# Patient Record
Sex: Female | Born: 1955 | Race: Black or African American | Hispanic: No | Marital: Married | Smoking: Never smoker
Health system: Southern US, Community
[De-identification: ages and names within clinical notes are randomized; demographics above are authoritative.]

## PROBLEM LIST (undated history)

## (undated) DIAGNOSIS — M719 Bursopathy, unspecified: Secondary | ICD-10-CM

## (undated) DIAGNOSIS — R002 Palpitations: Secondary | ICD-10-CM

## (undated) DIAGNOSIS — J3489 Other specified disorders of nose and nasal sinuses: Secondary | ICD-10-CM

## (undated) DIAGNOSIS — F410 Panic disorder [episodic paroxysmal anxiety] without agoraphobia: Secondary | ICD-10-CM

## (undated) DIAGNOSIS — R9439 Abnormal result of other cardiovascular function study: Secondary | ICD-10-CM

## (undated) DIAGNOSIS — F439 Reaction to severe stress, unspecified: Secondary | ICD-10-CM

## (undated) DIAGNOSIS — K644 Residual hemorrhoidal skin tags: Secondary | ICD-10-CM

## (undated) DIAGNOSIS — E669 Obesity, unspecified: Secondary | ICD-10-CM

## (undated) DIAGNOSIS — K219 Gastro-esophageal reflux disease without esophagitis: Secondary | ICD-10-CM

## (undated) DIAGNOSIS — M5136 Other intervertebral disc degeneration, lumbar region: Secondary | ICD-10-CM

## (undated) DIAGNOSIS — E119 Type 2 diabetes mellitus without complications: Secondary | ICD-10-CM

## (undated) DIAGNOSIS — E785 Hyperlipidemia, unspecified: Secondary | ICD-10-CM

## (undated) DIAGNOSIS — F32A Depression, unspecified: Secondary | ICD-10-CM

## (undated) DIAGNOSIS — J301 Allergic rhinitis due to pollen: Secondary | ICD-10-CM

## (undated) DIAGNOSIS — M549 Dorsalgia, unspecified: Secondary | ICD-10-CM

## (undated) DIAGNOSIS — G479 Sleep disorder, unspecified: Secondary | ICD-10-CM

## (undated) DIAGNOSIS — R238 Other skin changes: Secondary | ICD-10-CM

## (undated) DIAGNOSIS — F419 Anxiety disorder, unspecified: Secondary | ICD-10-CM

## (undated) DIAGNOSIS — M6289 Other specified disorders of muscle: Secondary | ICD-10-CM

## (undated) DIAGNOSIS — M1990 Unspecified osteoarthritis, unspecified site: Secondary | ICD-10-CM

## (undated) DIAGNOSIS — M25569 Pain in unspecified knee: Secondary | ICD-10-CM

## (undated) DIAGNOSIS — R682 Dry mouth, unspecified: Secondary | ICD-10-CM

## (undated) DIAGNOSIS — R450 Nervousness: Secondary | ICD-10-CM

## (undated) DIAGNOSIS — G4730 Sleep apnea, unspecified: Secondary | ICD-10-CM

## (undated) DIAGNOSIS — M51369 Other intervertebral disc degeneration, lumbar region without mention of lumbar back pain or lower extremity pain: Secondary | ICD-10-CM

## (undated) DIAGNOSIS — K5900 Constipation, unspecified: Secondary | ICD-10-CM

## (undated) DIAGNOSIS — N289 Disorder of kidney and ureter, unspecified: Secondary | ICD-10-CM

## (undated) DIAGNOSIS — R5383 Other fatigue: Secondary | ICD-10-CM

## (undated) DIAGNOSIS — I10 Essential (primary) hypertension: Secondary | ICD-10-CM

## (undated) DIAGNOSIS — F329 Major depressive disorder, single episode, unspecified: Secondary | ICD-10-CM

## (undated) DIAGNOSIS — R252 Cramp and spasm: Secondary | ICD-10-CM

## (undated) HISTORY — DX: Other specified disorders of muscle: M62.89

## (undated) HISTORY — DX: Other intervertebral disc degeneration, lumbar region without mention of lumbar back pain or lower extremity pain: M51.369

## (undated) HISTORY — DX: Allergic rhinitis due to pollen: J30.1

## (undated) HISTORY — DX: Abnormal result of other cardiovascular function study: R94.39

## (undated) HISTORY — DX: Panic disorder (episodic paroxysmal anxiety): F41.0

## (undated) HISTORY — DX: Nervousness: R45.0

## (undated) HISTORY — DX: Depression, unspecified: F32.A

## (undated) HISTORY — DX: Sleep disorder, unspecified: G47.9

## (undated) HISTORY — DX: Bursopathy, unspecified: M71.9

## (undated) HISTORY — DX: Other specified disorders of nose and nasal sinuses: J34.89

## (undated) HISTORY — PX: REPLACEMENT TOTAL KNEE: SUR1224

## (undated) HISTORY — DX: Dorsalgia, unspecified: M54.9

## (undated) HISTORY — DX: Essential (primary) hypertension: I10

## (undated) HISTORY — DX: Sleep apnea, unspecified: G47.30

## (undated) HISTORY — DX: Palpitations: R00.2

## (undated) HISTORY — DX: Dry mouth, unspecified: R68.2

## (undated) HISTORY — DX: Other fatigue: R53.83

## (undated) HISTORY — DX: Other intervertebral disc degeneration, lumbar region: M51.36

## (undated) HISTORY — PX: RIGHT OOPHORECTOMY: SHX2359

## (undated) HISTORY — DX: Reaction to severe stress, unspecified: F43.9

## (undated) HISTORY — DX: Gastro-esophageal reflux disease without esophagitis: K21.9

## (undated) HISTORY — DX: Unspecified osteoarthritis, unspecified site: M19.90

## (undated) HISTORY — DX: Residual hemorrhoidal skin tags: K64.4

## (undated) HISTORY — DX: Obesity, unspecified: E66.9

## (undated) HISTORY — DX: Other skin changes: R23.8

## (undated) HISTORY — PX: DILATION AND CURETTAGE OF UTERUS: SHX78

## (undated) HISTORY — DX: Anxiety disorder, unspecified: F41.9

## (undated) HISTORY — DX: Cramp and spasm: R25.2

## (undated) HISTORY — DX: Hyperlipidemia, unspecified: E78.5

## (undated) HISTORY — DX: Major depressive disorder, single episode, unspecified: F32.9

## (undated) HISTORY — DX: Constipation, unspecified: K59.00

## (undated) HISTORY — PX: ABDOMINAL HYSTERECTOMY: SHX81

## (undated) HISTORY — DX: Pain in unspecified knee: M25.569

---

## 1998-10-16 ENCOUNTER — Other Ambulatory Visit: Admission: RE | Admit: 1998-10-16 | Discharge: 1998-10-16 | Payer: Self-pay | Admitting: Obstetrics and Gynecology

## 1999-02-12 ENCOUNTER — Encounter: Admission: RE | Admit: 1999-02-12 | Discharge: 1999-05-13 | Payer: Self-pay | Admitting: Family Medicine

## 1999-02-23 ENCOUNTER — Encounter: Payer: Self-pay | Admitting: Obstetrics and Gynecology

## 1999-02-23 ENCOUNTER — Ambulatory Visit (HOSPITAL_COMMUNITY): Admission: RE | Admit: 1999-02-23 | Discharge: 1999-02-23 | Payer: Self-pay | Admitting: Obstetrics and Gynecology

## 1999-03-03 ENCOUNTER — Ambulatory Visit (HOSPITAL_COMMUNITY): Admission: RE | Admit: 1999-03-03 | Discharge: 1999-03-03 | Payer: Self-pay | Admitting: Obstetrics and Gynecology

## 1999-03-03 ENCOUNTER — Encounter: Payer: Self-pay | Admitting: Obstetrics and Gynecology

## 1999-04-02 ENCOUNTER — Observation Stay (HOSPITAL_COMMUNITY): Admission: EM | Admit: 1999-04-02 | Discharge: 1999-04-03 | Payer: Self-pay | Admitting: Obstetrics and Gynecology

## 1999-04-02 ENCOUNTER — Encounter: Payer: Self-pay | Admitting: Obstetrics and Gynecology

## 1999-07-23 ENCOUNTER — Ambulatory Visit (HOSPITAL_BASED_OUTPATIENT_CLINIC_OR_DEPARTMENT_OTHER): Admission: RE | Admit: 1999-07-23 | Discharge: 1999-07-23 | Payer: Self-pay | Admitting: *Deleted

## 1999-08-07 ENCOUNTER — Ambulatory Visit (HOSPITAL_COMMUNITY): Admission: RE | Admit: 1999-08-07 | Discharge: 1999-08-07 | Payer: Self-pay | Admitting: *Deleted

## 2000-11-04 ENCOUNTER — Encounter (INDEPENDENT_AMBULATORY_CARE_PROVIDER_SITE_OTHER): Payer: Self-pay | Admitting: Specialist

## 2000-11-04 ENCOUNTER — Other Ambulatory Visit: Admission: RE | Admit: 2000-11-04 | Discharge: 2000-11-04 | Payer: Self-pay | Admitting: *Deleted

## 2001-01-24 ENCOUNTER — Encounter: Payer: Self-pay | Admitting: Family Medicine

## 2001-01-24 ENCOUNTER — Encounter: Admission: RE | Admit: 2001-01-24 | Discharge: 2001-01-24 | Payer: Self-pay | Admitting: Family Medicine

## 2001-03-14 ENCOUNTER — Encounter (INDEPENDENT_AMBULATORY_CARE_PROVIDER_SITE_OTHER): Payer: Self-pay

## 2001-03-14 ENCOUNTER — Observation Stay (HOSPITAL_COMMUNITY): Admission: RE | Admit: 2001-03-14 | Discharge: 2001-03-15 | Payer: Self-pay | Admitting: *Deleted

## 2002-03-21 ENCOUNTER — Other Ambulatory Visit: Admission: RE | Admit: 2002-03-21 | Discharge: 2002-03-21 | Payer: Self-pay | Admitting: *Deleted

## 2002-06-01 ENCOUNTER — Encounter: Admission: RE | Admit: 2002-06-01 | Discharge: 2002-06-01 | Payer: Self-pay | Admitting: Neurology

## 2002-06-01 ENCOUNTER — Encounter: Payer: Self-pay | Admitting: Neurology

## 2003-01-09 ENCOUNTER — Ambulatory Visit: Admission: RE | Admit: 2003-01-09 | Discharge: 2003-01-09 | Payer: Self-pay | Admitting: Family Medicine

## 2003-04-17 ENCOUNTER — Ambulatory Visit (HOSPITAL_BASED_OUTPATIENT_CLINIC_OR_DEPARTMENT_OTHER): Admission: RE | Admit: 2003-04-17 | Discharge: 2003-04-17 | Payer: Self-pay | Admitting: Orthopedic Surgery

## 2003-04-30 ENCOUNTER — Other Ambulatory Visit: Admission: RE | Admit: 2003-04-30 | Discharge: 2003-04-30 | Payer: Self-pay | Admitting: *Deleted

## 2003-06-14 ENCOUNTER — Encounter: Admission: RE | Admit: 2003-06-14 | Discharge: 2003-06-14 | Payer: Self-pay | Admitting: *Deleted

## 2003-06-14 ENCOUNTER — Encounter: Payer: Self-pay | Admitting: *Deleted

## 2003-07-04 ENCOUNTER — Ambulatory Visit (HOSPITAL_COMMUNITY): Admission: RE | Admit: 2003-07-04 | Discharge: 2003-07-04 | Payer: Self-pay | Admitting: Cardiology

## 2003-07-04 ENCOUNTER — Encounter: Payer: Self-pay | Admitting: Cardiology

## 2004-04-20 ENCOUNTER — Encounter: Payer: Self-pay | Admitting: Internal Medicine

## 2004-04-20 LAB — HM COLONOSCOPY: HM Colonoscopy: NORMAL

## 2004-04-29 ENCOUNTER — Other Ambulatory Visit: Admission: RE | Admit: 2004-04-29 | Discharge: 2004-04-29 | Payer: Self-pay | Admitting: *Deleted

## 2004-07-27 ENCOUNTER — Encounter: Admission: RE | Admit: 2004-07-27 | Discharge: 2004-07-27 | Payer: Self-pay | Admitting: *Deleted

## 2004-10-08 ENCOUNTER — Ambulatory Visit: Payer: Self-pay | Admitting: Family Medicine

## 2004-12-02 ENCOUNTER — Ambulatory Visit: Payer: Self-pay | Admitting: Family Medicine

## 2004-12-09 ENCOUNTER — Other Ambulatory Visit (HOSPITAL_COMMUNITY): Admission: RE | Admit: 2004-12-09 | Discharge: 2004-12-24 | Payer: Self-pay | Admitting: Psychiatry

## 2004-12-09 ENCOUNTER — Ambulatory Visit: Payer: Self-pay | Admitting: Psychiatry

## 2005-02-25 ENCOUNTER — Ambulatory Visit: Payer: Self-pay | Admitting: Family Medicine

## 2005-05-03 ENCOUNTER — Other Ambulatory Visit: Admission: RE | Admit: 2005-05-03 | Discharge: 2005-05-03 | Payer: Self-pay | Admitting: *Deleted

## 2005-06-02 ENCOUNTER — Inpatient Hospital Stay (HOSPITAL_COMMUNITY): Admission: RE | Admit: 2005-06-02 | Discharge: 2005-06-06 | Payer: Self-pay | Admitting: Orthopedic Surgery

## 2005-07-23 ENCOUNTER — Ambulatory Visit: Payer: Self-pay | Admitting: Family Medicine

## 2005-08-24 ENCOUNTER — Encounter: Admission: RE | Admit: 2005-08-24 | Discharge: 2005-08-24 | Payer: Self-pay | Admitting: *Deleted

## 2005-09-14 ENCOUNTER — Ambulatory Visit: Payer: Self-pay | Admitting: Family Medicine

## 2006-05-18 ENCOUNTER — Other Ambulatory Visit: Admission: RE | Admit: 2006-05-18 | Discharge: 2006-05-18 | Payer: Self-pay | Admitting: *Deleted

## 2006-06-06 ENCOUNTER — Ambulatory Visit: Payer: Self-pay | Admitting: Family Medicine

## 2006-06-11 ENCOUNTER — Emergency Department (HOSPITAL_COMMUNITY): Admission: EM | Admit: 2006-06-11 | Discharge: 2006-06-11 | Payer: Self-pay | Admitting: Emergency Medicine

## 2006-08-23 ENCOUNTER — Ambulatory Visit: Payer: Self-pay | Admitting: Family Medicine

## 2006-08-27 ENCOUNTER — Emergency Department (HOSPITAL_COMMUNITY): Admission: EM | Admit: 2006-08-27 | Discharge: 2006-08-27 | Payer: Self-pay | Admitting: Emergency Medicine

## 2006-08-29 ENCOUNTER — Encounter: Admission: RE | Admit: 2006-08-29 | Discharge: 2006-08-29 | Payer: Self-pay | Admitting: *Deleted

## 2006-09-06 ENCOUNTER — Ambulatory Visit: Payer: Self-pay | Admitting: Family Medicine

## 2006-09-06 LAB — CONVERTED CEMR LAB: Potassium: 4.8 meq/L (ref 3.5–5.1)

## 2006-09-20 ENCOUNTER — Ambulatory Visit: Payer: Self-pay | Admitting: Family Medicine

## 2006-09-20 LAB — CONVERTED CEMR LAB
ALT: 11 units/L (ref 0–40)
AST: 16 units/L (ref 0–37)
Albumin: 3.5 g/dL (ref 3.5–5.2)
Alkaline Phosphatase: 105 units/L (ref 39–117)
BUN: 18 mg/dL (ref 6–23)
Basophils Absolute: 0 10*3/uL (ref 0.0–0.1)
Basophils Relative: 0.2 % (ref 0.0–1.0)
CO2: 31 meq/L (ref 19–32)
Calcium: 9.7 mg/dL (ref 8.4–10.5)
Chloride: 107 meq/L (ref 96–112)
Chol/HDL Ratio, serum: 3.1
Cholesterol: 193 mg/dL (ref 0–200)
Creatinine, Ser: 0.8 mg/dL (ref 0.4–1.2)
Eosinophil percent: 0.6 % (ref 0.0–5.0)
GFR calc non Af Amer: 80 mL/min
Glomerular Filtration Rate, Af Am: 97 mL/min/{1.73_m2}
Glucose, Bld: 89 mg/dL (ref 70–99)
HCT: 37.4 % (ref 36.0–46.0)
HDL: 62.3 mg/dL (ref >39.0)
Hemoglobin: 12.5 g/dL (ref 12.0–15.0)
LDL Cholesterol: 121 mg/dL — ABNORMAL HIGH (ref 0–99)
Lymphocytes Relative: 27.6 % (ref 12.0–46.0)
MCHC: 33.5 g/dL (ref 30.0–36.0)
MCV: 89.9 fL (ref 78.0–100.0)
Monocytes Absolute: 0.4 10*3/uL (ref 0.2–0.7)
Monocytes Relative: 6.9 % (ref 3.0–11.0)
Neutro Abs: 4 10*3/uL (ref 1.4–7.7)
Neutrophils Relative %: 64.7 % (ref 43.0–77.0)
Platelets: 212 10*3/uL (ref 150–400)
Potassium: 4.5 meq/L (ref 3.5–5.1)
RBC: 4.16 M/uL (ref 3.87–5.11)
RDW: 14.6 % (ref 11.5–14.6)
Sodium: 143 meq/L (ref 135–145)
TSH: 2.27 microintl units/mL (ref 0.35–5.50)
Total Bilirubin: 0.8 mg/dL (ref 0.3–1.2)
Total Protein: 7.2 g/dL (ref 6.0–8.3)
Triglyceride fasting, serum: 51 mg/dL (ref 0–149)
VLDL: 10 mg/dL (ref 0–40)
WBC: 6.1 10*3/uL (ref 4.5–10.5)

## 2006-09-27 ENCOUNTER — Ambulatory Visit: Payer: Self-pay | Admitting: Family Medicine

## 2007-01-16 ENCOUNTER — Ambulatory Visit: Payer: Self-pay | Admitting: Family Medicine

## 2007-04-13 ENCOUNTER — Ambulatory Visit: Payer: Self-pay | Admitting: Family Medicine

## 2007-07-04 ENCOUNTER — Other Ambulatory Visit: Admission: RE | Admit: 2007-07-04 | Discharge: 2007-07-04 | Payer: Self-pay | Admitting: *Deleted

## 2007-07-04 ENCOUNTER — Telehealth: Payer: Self-pay | Admitting: Family Medicine

## 2007-11-20 DIAGNOSIS — J069 Acute upper respiratory infection, unspecified: Secondary | ICD-10-CM | POA: Insufficient documentation

## 2007-11-27 ENCOUNTER — Ambulatory Visit: Payer: Self-pay | Admitting: Family Medicine

## 2007-12-29 ENCOUNTER — Ambulatory Visit: Payer: Self-pay | Admitting: Family Medicine

## 2007-12-29 LAB — CONVERTED CEMR LAB
ALT: 12 units/L (ref 0–35)
AST: 17 units/L (ref 0–37)
Albumin: 3.6 g/dL (ref 3.5–5.2)
Alkaline Phosphatase: 106 units/L (ref 39–117)
BUN: 12 mg/dL (ref 6–23)
Basophils Absolute: 0.1 10*3/uL (ref 0.0–0.1)
Basophils Relative: 0.6 % (ref 0.0–1.0)
Bilirubin, Direct: 0.1 mg/dL (ref 0.0–0.3)
Blood in Urine, dipstick: NEGATIVE
CO2: 30 meq/L (ref 19–32)
Calcium: 9.4 mg/dL (ref 8.4–10.5)
Chloride: 105 meq/L (ref 96–112)
Cholesterol: 197 mg/dL (ref 0–200)
Creatinine, Ser: 0.8 mg/dL (ref 0.4–1.2)
Eosinophils Absolute: 0 10*3/uL (ref 0.0–0.6)
Eosinophils Relative: 0.4 % (ref 0.0–5.0)
GFR calc Af Amer: 96 mL/min
GFR calc non Af Amer: 79 mL/min
Glucose, Bld: 88 mg/dL (ref 70–99)
Glucose, Urine, Semiquant: NEGATIVE
HCT: 38.7 % (ref 36.0–46.0)
HDL: 67 mg/dL (ref >39.0)
Hemoglobin: 12.8 g/dL (ref 12.0–15.0)
LDL Cholesterol: 123 mg/dL — ABNORMAL HIGH (ref 0–99)
Lymphocytes Relative: 23.5 % (ref 12.0–46.0)
MCHC: 33 g/dL (ref 30.0–36.0)
MCV: 89.2 fL (ref 78.0–100.0)
Monocytes Absolute: 0.6 10*3/uL (ref 0.2–0.7)
Monocytes Relative: 6.5 % (ref 3.0–11.0)
Neutro Abs: 6 10*3/uL (ref 1.4–7.7)
Neutrophils Relative %: 69 % (ref 43.0–77.0)
Nitrite: NEGATIVE
Platelets: 235 10*3/uL (ref 150–400)
Potassium: 4.3 meq/L (ref 3.5–5.1)
Protein, U semiquant: 30
RBC: 4.34 M/uL (ref 3.87–5.11)
RDW: 13.2 % (ref 11.5–14.6)
Sodium: 139 meq/L (ref 135–145)
Specific Gravity, Urine: 1.025
TSH: 2.83 microintl units/mL (ref 0.35–5.50)
Total Bilirubin: 0.7 mg/dL (ref 0.3–1.2)
Total CHOL/HDL Ratio: 2.9
Total Protein: 6.6 g/dL (ref 6.0–8.3)
Triglycerides: 33 mg/dL (ref 0–149)
Urobilinogen, UA: 0.2
VLDL: 7 mg/dL (ref 0–40)
WBC Urine, dipstick: NEGATIVE
WBC: 8.7 10*3/uL (ref 4.5–10.5)
pH: 6.5

## 2008-01-12 ENCOUNTER — Ambulatory Visit: Payer: Self-pay | Admitting: Family Medicine

## 2008-01-12 DIAGNOSIS — E785 Hyperlipidemia, unspecified: Secondary | ICD-10-CM | POA: Insufficient documentation

## 2008-01-12 DIAGNOSIS — E1169 Type 2 diabetes mellitus with other specified complication: Secondary | ICD-10-CM | POA: Insufficient documentation

## 2008-01-12 DIAGNOSIS — I10 Essential (primary) hypertension: Secondary | ICD-10-CM | POA: Insufficient documentation

## 2008-01-12 DIAGNOSIS — J309 Allergic rhinitis, unspecified: Secondary | ICD-10-CM | POA: Insufficient documentation

## 2008-01-12 DIAGNOSIS — F339 Major depressive disorder, recurrent, unspecified: Secondary | ICD-10-CM | POA: Insufficient documentation

## 2008-01-12 DIAGNOSIS — E1159 Type 2 diabetes mellitus with other circulatory complications: Secondary | ICD-10-CM | POA: Insufficient documentation

## 2008-07-08 ENCOUNTER — Encounter
Admission: RE | Admit: 2008-07-08 | Discharge: 2008-07-08 | Payer: Self-pay | Admitting: Physical Medicine and Rehabilitation

## 2008-10-14 ENCOUNTER — Other Ambulatory Visit: Admission: RE | Admit: 2008-10-14 | Discharge: 2008-10-14 | Payer: Self-pay | Admitting: Gynecology

## 2008-12-02 ENCOUNTER — Ambulatory Visit: Payer: Self-pay | Admitting: Family Medicine

## 2008-12-02 DIAGNOSIS — M26609 Unspecified temporomandibular joint disorder, unspecified side: Secondary | ICD-10-CM | POA: Insufficient documentation

## 2008-12-02 LAB — CONVERTED CEMR LAB
Bilirubin Urine: NEGATIVE
Blood in Urine, dipstick: NEGATIVE
Glucose, Urine, Semiquant: NEGATIVE
Ketones, urine, test strip: NEGATIVE
Nitrite: NEGATIVE
Specific Gravity, Urine: 1.015
Urobilinogen, UA: 0.2
WBC Urine, dipstick: NEGATIVE
pH: 7

## 2009-01-23 ENCOUNTER — Ambulatory Visit: Payer: Self-pay | Admitting: Family Medicine

## 2009-01-23 LAB — CONVERTED CEMR LAB
ALT: 14 units/L (ref 0–35)
AST: 19 units/L (ref 0–37)
Albumin: 3.3 g/dL — ABNORMAL LOW (ref 3.5–5.2)
Alkaline Phosphatase: 112 units/L (ref 39–117)
BUN: 15 mg/dL (ref 6–23)
Basophils Absolute: 0 10*3/uL (ref 0.0–0.1)
Basophils Relative: 0.4 % (ref 0.0–3.0)
Bilirubin, Direct: 0.1 mg/dL (ref 0.0–0.3)
CO2: 31 meq/L (ref 19–32)
Calcium: 9.1 mg/dL (ref 8.4–10.5)
Chloride: 105 meq/L (ref 96–112)
Cholesterol: 170 mg/dL (ref 0–200)
Creatinine, Ser: 0.7 mg/dL (ref 0.4–1.2)
Eosinophils Absolute: 0.1 10*3/uL (ref 0.0–0.7)
Eosinophils Relative: 0.9 % (ref 0.0–5.0)
GFR calc non Af Amer: 111.66 mL/min (ref >60)
Glucose, Bld: 104 mg/dL — ABNORMAL HIGH (ref 70–99)
HCT: 37.4 % (ref 36.0–46.0)
HDL: 44.2 mg/dL (ref >39.00)
Hemoglobin: 12.6 g/dL (ref 12.0–15.0)
Hgb A1c MFr Bld: 6.2 % (ref 4.6–6.5)
LDL Cholesterol: 111 mg/dL — ABNORMAL HIGH (ref 0–99)
Lymphocytes Relative: 23 % (ref 12.0–46.0)
Lymphs Abs: 1.3 10*3/uL (ref 0.7–4.0)
MCHC: 33.9 g/dL (ref 30.0–36.0)
MCV: 89.6 fL (ref 78.0–100.0)
Monocytes Absolute: 0.4 10*3/uL (ref 0.1–1.0)
Monocytes Relative: 7.4 % (ref 3.0–12.0)
Neutro Abs: 3.8 10*3/uL (ref 1.4–7.7)
Neutrophils Relative %: 68.3 % (ref 43.0–77.0)
Platelets: 218 10*3/uL (ref 150.0–400.0)
Potassium: 3.9 meq/L (ref 3.5–5.1)
RBC: 4.17 M/uL (ref 3.87–5.11)
RDW: 13.4 % (ref 11.5–14.6)
Sodium: 142 meq/L (ref 135–145)
TSH: 1.9 microintl units/mL (ref 0.35–5.50)
Total Bilirubin: 0.7 mg/dL (ref 0.3–1.2)
Total CHOL/HDL Ratio: 4
Total Protein: 7.1 g/dL (ref 6.0–8.3)
Triglycerides: 75 mg/dL (ref 0.0–149.0)
VLDL: 15 mg/dL (ref 0.0–40.0)
WBC: 5.6 10*3/uL (ref 4.5–10.5)

## 2009-01-30 ENCOUNTER — Ambulatory Visit: Payer: Self-pay | Admitting: Family Medicine

## 2009-02-04 ENCOUNTER — Telehealth: Payer: Self-pay | Admitting: Family Medicine

## 2009-02-06 ENCOUNTER — Ambulatory Visit (HOSPITAL_COMMUNITY): Admission: RE | Admit: 2009-02-06 | Discharge: 2009-02-06 | Payer: Self-pay | Admitting: Family Medicine

## 2009-03-11 ENCOUNTER — Ambulatory Visit: Payer: Self-pay | Admitting: Internal Medicine

## 2009-03-11 DIAGNOSIS — L509 Urticaria, unspecified: Secondary | ICD-10-CM | POA: Insufficient documentation

## 2009-09-15 ENCOUNTER — Ambulatory Visit: Payer: Self-pay | Admitting: Internal Medicine

## 2009-10-17 ENCOUNTER — Ambulatory Visit: Payer: Self-pay | Admitting: Internal Medicine

## 2009-10-17 DIAGNOSIS — K5909 Other constipation: Secondary | ICD-10-CM | POA: Insufficient documentation

## 2009-10-17 DIAGNOSIS — K644 Residual hemorrhoidal skin tags: Secondary | ICD-10-CM | POA: Insufficient documentation

## 2009-10-17 LAB — CONVERTED CEMR LAB: Blood Glucose, AC Bkfst: 89 mg/dL

## 2009-10-31 ENCOUNTER — Telehealth: Payer: Self-pay | Admitting: Internal Medicine

## 2009-10-31 ENCOUNTER — Ambulatory Visit: Payer: Self-pay | Admitting: Pulmonary Disease

## 2009-10-31 DIAGNOSIS — G4733 Obstructive sleep apnea (adult) (pediatric): Secondary | ICD-10-CM | POA: Insufficient documentation

## 2009-11-12 ENCOUNTER — Encounter: Payer: Self-pay | Admitting: Pulmonary Disease

## 2009-11-12 ENCOUNTER — Ambulatory Visit (HOSPITAL_BASED_OUTPATIENT_CLINIC_OR_DEPARTMENT_OTHER): Admission: RE | Admit: 2009-11-12 | Discharge: 2009-11-12 | Payer: Self-pay | Admitting: Pulmonary Disease

## 2009-11-21 ENCOUNTER — Ambulatory Visit: Payer: Self-pay | Admitting: Internal Medicine

## 2009-11-21 DIAGNOSIS — B379 Candidiasis, unspecified: Secondary | ICD-10-CM | POA: Insufficient documentation

## 2009-11-21 DIAGNOSIS — N390 Urinary tract infection, site not specified: Secondary | ICD-10-CM | POA: Insufficient documentation

## 2009-11-21 LAB — CONVERTED CEMR LAB
Bilirubin Urine: NEGATIVE
Blood in Urine, dipstick: NEGATIVE
Glucose, Urine, Semiquant: NEGATIVE
Ketones, urine, test strip: NEGATIVE
Nitrite: NEGATIVE
Protein, U semiquant: NEGATIVE
Specific Gravity, Urine: <1.005
Urobilinogen, UA: 0.2
WBC Urine, dipstick: NEGATIVE
pH: 5

## 2009-11-30 ENCOUNTER — Ambulatory Visit: Payer: Self-pay | Admitting: Pulmonary Disease

## 2009-12-01 ENCOUNTER — Telehealth (INDEPENDENT_AMBULATORY_CARE_PROVIDER_SITE_OTHER): Payer: Self-pay | Admitting: *Deleted

## 2009-12-03 ENCOUNTER — Telehealth (INDEPENDENT_AMBULATORY_CARE_PROVIDER_SITE_OTHER): Payer: Self-pay | Admitting: *Deleted

## 2009-12-03 ENCOUNTER — Ambulatory Visit: Payer: Self-pay | Admitting: Pulmonary Disease

## 2010-01-07 ENCOUNTER — Ambulatory Visit: Payer: Self-pay | Admitting: Pulmonary Disease

## 2010-02-09 ENCOUNTER — Encounter: Payer: Self-pay | Admitting: Pulmonary Disease

## 2010-02-23 ENCOUNTER — Encounter: Admission: RE | Admit: 2010-02-23 | Discharge: 2010-02-23 | Payer: Self-pay | Admitting: Internal Medicine

## 2010-02-23 ENCOUNTER — Ambulatory Visit: Payer: Self-pay | Admitting: Internal Medicine

## 2010-02-23 DIAGNOSIS — R5383 Other fatigue: Secondary | ICD-10-CM

## 2010-02-23 DIAGNOSIS — R5381 Other malaise: Secondary | ICD-10-CM | POA: Insufficient documentation

## 2010-02-23 LAB — CONVERTED CEMR LAB
ALT: 13 units/L (ref 0–35)
AST: 16 units/L (ref 0–37)
Albumin: 3.8 g/dL (ref 3.5–5.2)
Alkaline Phosphatase: 146 units/L — ABNORMAL HIGH (ref 39–117)
BUN: 17 mg/dL (ref 6–23)
Basophils Absolute: 0 10*3/uL (ref 0.0–0.1)
Basophils Relative: 0.4 % (ref 0.0–3.0)
Bilirubin Urine: NEGATIVE
Bilirubin, Direct: 0.1 mg/dL (ref 0.0–0.3)
Blood in Urine, dipstick: NEGATIVE
CO2: 33 meq/L — ABNORMAL HIGH (ref 19–32)
Calcium: 9.4 mg/dL (ref 8.4–10.5)
Chloride: 102 meq/L (ref 96–112)
Cholesterol: 181 mg/dL (ref 0–200)
Creatinine, Ser: 0.7 mg/dL (ref 0.4–1.2)
Eosinophils Absolute: 0.1 10*3/uL (ref 0.0–0.7)
Eosinophils Relative: 1.4 % (ref 0.0–5.0)
GFR calc non Af Amer: 109.42 mL/min (ref >60)
Glucose, Bld: 95 mg/dL (ref 70–99)
Glucose, Urine, Semiquant: NEGATIVE
HCT: 37.8 % (ref 36.0–46.0)
HDL: 46.9 mg/dL (ref >39.00)
Hemoglobin: 12.7 g/dL (ref 12.0–15.0)
Ketones, urine, test strip: NEGATIVE
LDL Cholesterol: 107 mg/dL — ABNORMAL HIGH (ref 0–99)
Lymphocytes Relative: 25.3 % (ref 12.0–46.0)
Lymphs Abs: 1.7 10*3/uL (ref 0.7–4.0)
MCHC: 33.6 g/dL (ref 30.0–36.0)
MCV: 87.1 fL (ref 78.0–100.0)
Monocytes Absolute: 0.6 10*3/uL (ref 0.1–1.0)
Monocytes Relative: 8.8 % (ref 3.0–12.0)
Neutro Abs: 4.4 10*3/uL (ref 1.4–7.7)
Neutrophils Relative %: 64.1 % (ref 43.0–77.0)
Nitrite: NEGATIVE
Platelets: 237 10*3/uL (ref 150.0–400.0)
Potassium: 4.1 meq/L (ref 3.5–5.1)
Protein, U semiquant: NEGATIVE
RBC: 4.35 M/uL (ref 3.87–5.11)
RDW: 13.8 % (ref 11.5–14.6)
Sodium: 143 meq/L (ref 135–145)
Specific Gravity, Urine: <1.005
TSH: 2.11 microintl units/mL (ref 0.35–5.50)
Total Bilirubin: 0.4 mg/dL (ref 0.3–1.2)
Total CHOL/HDL Ratio: 4
Total Protein: 7.4 g/dL (ref 6.0–8.3)
Triglycerides: 136 mg/dL (ref 0.0–149.0)
Urobilinogen, UA: 0.2
VLDL: 27.2 mg/dL (ref 0.0–40.0)
WBC Urine, dipstick: NEGATIVE
WBC: 6.9 10*3/uL (ref 4.5–10.5)
pH: 5

## 2010-02-23 LAB — HM MAMMOGRAPHY: HM Mammogram: NEGATIVE

## 2010-03-13 ENCOUNTER — Telehealth: Payer: Self-pay | Admitting: Internal Medicine

## 2010-04-14 ENCOUNTER — Telehealth (INDEPENDENT_AMBULATORY_CARE_PROVIDER_SITE_OTHER): Payer: Self-pay | Admitting: *Deleted

## 2010-04-14 ENCOUNTER — Telehealth: Payer: Self-pay | Admitting: Internal Medicine

## 2010-05-27 ENCOUNTER — Telehealth: Payer: Self-pay | Admitting: Internal Medicine

## 2010-06-08 ENCOUNTER — Ambulatory Visit: Payer: Self-pay | Admitting: Internal Medicine

## 2010-06-09 ENCOUNTER — Telehealth: Payer: Self-pay | Admitting: Internal Medicine

## 2010-06-09 ENCOUNTER — Encounter: Payer: Self-pay | Admitting: Internal Medicine

## 2010-06-18 DIAGNOSIS — R198 Other specified symptoms and signs involving the digestive system and abdomen: Secondary | ICD-10-CM | POA: Insufficient documentation

## 2010-06-18 DIAGNOSIS — K5900 Constipation, unspecified: Secondary | ICD-10-CM | POA: Insufficient documentation

## 2010-06-23 ENCOUNTER — Ambulatory Visit: Payer: Self-pay | Admitting: Internal Medicine

## 2010-06-27 ENCOUNTER — Encounter: Payer: Self-pay | Admitting: Pulmonary Disease

## 2010-06-29 ENCOUNTER — Ambulatory Visit: Payer: Self-pay | Admitting: Internal Medicine

## 2010-06-29 LAB — CONVERTED CEMR LAB
ALT: 13 units/L (ref 0–35)
AST: 17 units/L (ref 0–37)
Albumin: 3.4 g/dL — ABNORMAL LOW (ref 3.5–5.2)
Alkaline Phosphatase: 130 units/L — ABNORMAL HIGH (ref 39–117)
BUN: 12 mg/dL (ref 6–23)
Basophils Absolute: 0 10*3/uL (ref 0.0–0.1)
Basophils Relative: 0.4 % (ref 0.0–3.0)
Bilirubin Urine: NEGATIVE
Bilirubin, Direct: 0.1 mg/dL (ref 0.0–0.3)
CO2: 29 meq/L (ref 19–32)
Calcium: 9.1 mg/dL (ref 8.4–10.5)
Chloride: 105 meq/L (ref 96–112)
Cholesterol: 158 mg/dL (ref 0–200)
Creatinine, Ser: 0.7 mg/dL (ref 0.4–1.2)
Eosinophils Absolute: 0.1 10*3/uL (ref 0.0–0.7)
Eosinophils Relative: 0.9 % (ref 0.0–5.0)
GFR calc non Af Amer: 118.89 mL/min (ref >60)
Glucose, Bld: 94 mg/dL (ref 70–99)
HCT: 35 % — ABNORMAL LOW (ref 36.0–46.0)
HDL: 41 mg/dL (ref >39.00)
Hemoglobin, Urine: NEGATIVE
Hemoglobin: 11.8 g/dL — ABNORMAL LOW (ref 12.0–15.0)
LDL Cholesterol: 99 mg/dL (ref 0–99)
Leukocytes, UA: NEGATIVE
Lymphocytes Relative: 24.7 % (ref 12.0–46.0)
Lymphs Abs: 1.5 10*3/uL (ref 0.7–4.0)
MCHC: 33.7 g/dL (ref 30.0–36.0)
MCV: 87.2 fL (ref 78.0–100.0)
Monocytes Absolute: 0.5 10*3/uL (ref 0.1–1.0)
Monocytes Relative: 7.6 % (ref 3.0–12.0)
Neutro Abs: 4 10*3/uL (ref 1.4–7.7)
Neutrophils Relative %: 66.4 % (ref 43.0–77.0)
Nitrite: NEGATIVE
Platelets: 218 10*3/uL (ref 150.0–400.0)
Potassium: 4.3 meq/L (ref 3.5–5.1)
RBC: 4.02 M/uL (ref 3.87–5.11)
RDW: 14.9 % — ABNORMAL HIGH (ref 11.5–14.6)
Sodium: 141 meq/L (ref 135–145)
Specific Gravity, Urine: 1.02 (ref 1.000–1.030)
TSH: 2.29 microintl units/mL (ref 0.35–5.50)
Total Bilirubin: 0.2 mg/dL — ABNORMAL LOW (ref 0.3–1.2)
Total CHOL/HDL Ratio: 4
Total Protein, Urine: NEGATIVE mg/dL
Total Protein: 6.4 g/dL (ref 6.0–8.3)
Triglycerides: 90 mg/dL (ref 0.0–149.0)
Urine Glucose: NEGATIVE mg/dL
Urobilinogen, UA: 1 (ref 0.0–1.0)
VLDL: 18 mg/dL (ref 0.0–40.0)
WBC: 6.1 10*3/uL (ref 4.5–10.5)
pH: 7 (ref 5.0–8.0)

## 2010-07-01 ENCOUNTER — Encounter: Payer: Self-pay | Admitting: Internal Medicine

## 2010-07-01 ENCOUNTER — Ambulatory Visit: Payer: Self-pay | Admitting: Internal Medicine

## 2010-07-03 ENCOUNTER — Ambulatory Visit (HOSPITAL_COMMUNITY): Admission: RE | Admit: 2010-07-03 | Discharge: 2010-07-03 | Payer: Self-pay | Admitting: Internal Medicine

## 2010-07-06 ENCOUNTER — Ambulatory Visit: Payer: Self-pay | Admitting: Pulmonary Disease

## 2010-07-22 ENCOUNTER — Telehealth: Payer: Self-pay | Admitting: Internal Medicine

## 2010-08-10 ENCOUNTER — Telehealth: Payer: Self-pay | Admitting: Internal Medicine

## 2010-09-07 ENCOUNTER — Telehealth: Payer: Self-pay | Admitting: Pulmonary Disease

## 2010-11-11 ENCOUNTER — Encounter: Payer: Self-pay | Admitting: Internal Medicine

## 2010-11-11 ENCOUNTER — Ambulatory Visit (INDEPENDENT_AMBULATORY_CARE_PROVIDER_SITE_OTHER): Admitting: Internal Medicine

## 2010-11-11 DIAGNOSIS — T148XXA Other injury of unspecified body region, initial encounter: Secondary | ICD-10-CM | POA: Insufficient documentation

## 2010-11-11 DIAGNOSIS — W540XXA Bitten by dog, initial encounter: Secondary | ICD-10-CM

## 2010-11-12 NOTE — Letter (Signed)
Summary: [facility] Instructions  [facility]  [address] [place], [place] [address]   Phone: [phone]  Fax: [phone]       Erica [name]    04/27/1956    MRN: [id number]   Dulcolax (Polyethylene Glycol) Bowel Purge    1   Drink clear liquids the entire day-NO SOLID FOOD  2   Do not drink anything colored red or purple.  Avoid juices with pulp.  No orange juice.  3   Drink at least 64 oz. (8 glasses) of fluid/clear liquids during the day to prevent dehydration and help the prep work efficiently.  CLEAR LIQUIDS INCLUDE: Water Jello Ice Popsicles Tea (sugar ok, no milk/cream) Powdered fruit flavored drinks Coffee (sugar ok, no milk/cream) Gatorade Juice: apple, white grape, white cranberry  Lemonade Clear bullion, consomm, broth Carbonated beverages (any kind) Strained chicken noodle soup Hard Candy  4   Mix the entire 2 bottles of Dulcolax with 64 oz. of Gatorade/Powerade in the morning and put in the refrigerator to chill.  5   At 3:00 pm take 2 Dulcolax/Bisacodyl tablets.  6   At 4:30 pm take one Reglan/Metoclopramide tablet.  7  Starting at 5:00 pm drink one 8 oz glass of the Dulcolax mixture every 15-20 minutes until you have finished drinking the entire 64 oz.  You should finish drinking around 7:30 or 8:00 pm.  8   If you are nauseated, you may take the 2nd Reglan/Metoclopramide tablet at 6:30 pm.        9    At 8:00 pm take 2 more DULCOLAX/Bisacodyl tablets.         The above instructions have been reviewed and explained to me by   _______________________    I fully understand and can verbalize these instructions _____________________________ Date _______

## 2010-11-12 NOTE — Letter (Signed)
Summary: CMN  CMN   Imported By: Valinda Hoar 02/09/2010 17:09:28  _____________________________________________________________________  External Attachment:    Type:   Image     Comment:   External Document

## 2010-11-12 NOTE — Progress Notes (Signed)
Summary: RX  Phone Note Call from Patient Call back at [facility] Phone [phone]   Caller: Patient Summary of Call: LOST PRESCRIPTIONS THAT WERE GIVING TO HER AT LAST VISIT, PLEASE MAIL TO PT Initial call taken by: Migdalia Dk,  July 22, 2010 11:01 AM  Follow-up for Phone Call        MD signed, Rxs mailed. Pt aware and home address verified. Follow-up by: Margaret Pyle, [name],  July 22, 2010 1:00 PM    Prescriptions: KLOR-CON M20 20 MEQ  TBCR (POTASSIUM CHLORIDE CRYS CR) one bid  #180 x 3   Entered by:   Margaret Pyle, [name]   Authorized by:   [name]   Signed by:   Margaret Pyle, [name] on 07/22/2010   Method used:   Print then Mail to Patient   RxID:   [id number] LASIX 20 MG  TABS (FUROSEMIDE) one pm  #90 x 3   Entered by:   Margaret Pyle, [name]   Authorized by:   [name]   Signed by:   Margaret Pyle, [name] on 07/22/2010   Method used:   Print then Mail to Patient   RxID:   [id number] LASIX 40 MG  TABS (FUROSEMIDE) one in am  #90 x 3   Entered by:   Margaret Pyle, [name]   Authorized by:   [name]   Signed by:   Margaret Pyle, [name] on 07/22/2010   Method used:   Print then Mail to Patient   RxID:   [id number] CELEXA 40 MG  TABS (CITALOPRAM HYDROBROMIDE) one by mouth daily  #90 x 3   Entered by:   Margaret Pyle, [name]   Authorized by:   [name]   Signed by:   Margaret Pyle, [name] on 07/22/2010   Method used:   Print then Mail to Patient   RxID:   [id number] FLONASE 50 MCG/ACT  SUSP (FLUTICASONE PROPIONATE) SPRAY IN EACH NOSTRIL two times a day  #4 x 3   Entered by:   Margaret Pyle, [name]   Authorized by:   [name]   Signed by:   Margaret Pyle, [name] on 07/22/2010   Method used:   Print then Mail to Patient   RxID:   [id number] SIMVASTATIN 40 MG TABS (SIMVASTATIN) one by mouth daily   #90 x 3   Entered by:   Margaret Pyle, [name]   Authorized by:   [name]   Signed by:   Margaret Pyle, [name] on 07/22/2010   Method used:   Print then Mail to Patient   RxID:   [id number] CYCLOBENZAPRINE HCL 10 MG TABS (CYCLOBENZAPRINE HCL) once daily  #90 x 3   Entered by:   Margaret Pyle, [name]   Authorized by:   [name]   Signed by:   Margaret Pyle, [name] on 07/22/2010   Method used:   Print then Mail to Patient   RxID:   [id number]

## 2010-11-12 NOTE — Progress Notes (Signed)
Summary: lost rx's  Phone Note Call from Patient   Caller: pt/ walk-in Summary of Call: Pt states she lost rx for simvastatin that was given @ last vist. Also pt is need refill on lasix 20 & 40mg . need written rx to send to [facility]. Initial call taken by: Orlan Leavens,  March 13, 2010 2:19 PM  Follow-up for Phone Call        Sent 0mg  of lasix to local pharm per pt since she was out. she is requesting other 2 rx to mailed to her address. Follow-up by: Orlan Leavens,  March 13, 2010 3:05 PM    Prescriptions: LASIX 20 MG  TABS (FUROSEMIDE) one pm  #90 x 3   Entered by:   Orlan Leavens   Authorized by:   [name]   Signed by:   Orlan Leavens on 03/13/2010   Method used:   Electronically to        [facility]  [address] [id number] (retail)       [address]       [place], [place]  [address]       Ph: [phone] or [phone]       Fax: [phone]   RxID:   [id number] SIMVASTATIN 40 MG TABS (SIMVASTATIN) one by mouth daily  #90 x 3   Entered by:   Orlan Leavens   Authorized by:   Corwin Levins MD   Signed by:   Orlan Leavens on 03/13/2010   Method used:   Print then Give to Patient   RxID:   [id number] LASIX 40 MG  TABS (FUROSEMIDE) one in am  #90 x 3   Entered by:   Orlan Leavens   Authorized by:   Corwin Levins MD   Signed by:   Orlan Leavens on 03/13/2010   Method used:   Print then Give to Patient   RxID:   [id number]

## 2010-11-12 NOTE — Assessment & Plan Note (Signed)
Summary: cpx/no pap/njr   Vital Signs:  Patient profile:   55 year old female Height:      66 inches Weight:      246 pounds BMI:     39.85 Temp:     98.6 degrees F oral BP sitting:   120 / 88  (left arm) Cuff size:   regular  Vitals Entered By: Kern Reap [name] (January 30, 2009 11:26 AM)  Reason for Visit cpx  History of Present Illness: Erica [name] is a 55 year old female, who comes in today accompanied by a 79-year-old grandchild for evaluation of hyperlipidemia, allergic rhinitis, depression, hypertension.  Her allergic rhinitis is treated with Flonase nasal spray, and Allegra.  Symptoms under good control.  For hyperlipidemia.  She takes simvastatin 40 mg nightly lipids are normal with medication.  No side effects.  She also takes Celexa 40 mg nightly for depression.  Doing okay.  She also takes Lasix 40 mg in the morning, 20 mg at noon, with a potassium supplement 20 mEq daily for mild hypertension.  BP 120/85 at home.  She also takes Flexeril, 10 mg daily, along with oxycodone 5 mg daily, Xanax 1 mg X. R. b.i.d. for chronic anxiety and depression.  Her psychiatrist writes this medication.  Her GYN doctor for right circumflex marrow probe .0 45 -- .015 weekly for postmenopausal hot flashes.  I advised to stop the, HRT she's been on it for more than 5 years.  She gets routine eye care and dental care.  Breast self examination at home.  She does need to get his note for mammogram.  Explained.  She is not in a referral for mammography she can call her self.  She's also had recurrent trouble with hemorrhoids.  Now some balling up for two months.  She describes bleeding and pain.  Her gastroenterologist is Dr. Dickie La.  She's had a recent colonoscopy, which was normal.she gets all her medications at the [place]  Last tetanus booster 2005.  Will give her a Pneumovax today  Allergies: 1)  ! Guaifenesin-Codeine (Guaifenesin-Codeine)  Past History:  Past medical, surgical, family and  social histories (including risk factors) reviewed, and no changes noted (except as noted below).  Past Medical History:    Reviewed history from 11/27/2007 and no changes required:    hypertension    PAF    high cholesterol    panic attacks    right total knee hysterectomy with right oophorectomy.  No cancer    childbirth x 3  Family History:    Reviewed history and no changes required:  Social History:    Reviewed history from 11/27/2007 and no changes required:       Married       Never Smoked       Alcohol use-no       Drug use-no       Regular exercise-no  Review of Systems      See HPI  Physical Exam  General:  Well-developed,well-nourished,in no acute distress; alert,appropriate and cooperative throughout examination Head:  Normocephalic and atraumatic without obvious abnormalities. No apparent alopecia or balding. Eyes:  No corneal or conjunctival inflammation noted. EOMI. Perrla. Funduscopic exam benign, without hemorrhages, exudates or papilledema. Vision grossly normal. Ears:  External ear exam shows no significant lesions or deformities.  Otoscopic examination reveals clear canals, tympanic membranes are intact bilaterally without bulging, retraction, inflammation or discharge. Hearing is grossly normal bilaterally. Nose:  External nasal examination shows no deformity or inflammation. Nasal mucosa are  pink and moist without lesions or exudates. Mouth:  Oral mucosa and oropharynx without lesions or exudates.  Teeth in good repair. Neck:  No deformities, masses, or tenderness noted. Chest Wall:  No deformities, masses, or tenderness noted. Breasts:  No mass, nodules, thickening, tenderness, bulging, retraction, inflamation, nipple discharge or skin changes noted.   Lungs:  Normal respiratory effort, chest expands symmetrically. Lungs are clear to auscultation, no crackles or wheezes. Heart:  Normal rate and regular rhythm. S1 and S2 normal without gallop, murmur, click,  rub or other extra sounds. Abdomen:  Bowel sounds positive,abdomen soft and non-tender without masses, organomegaly or hernias noted. Rectal:  gyn Genitalia:  gyn Msk:  No deformity or scoliosis noted of thoracic or lumbar spine.   Pulses:  R and L carotid,radial,femoral,dorsalis pedis and posterior tibial pulses are full and equal bilaterally Extremities:  No clubbing, cyanosis, edema, or deformity noted with normal full range of motion of all joints.   Neurologic:  No cranial nerve deficits noted. Station and gait are normal. Plantar reflexes are down-going bilaterally. DTRs are symmetrical throughout. Sensory, motor and coordinative functions appear intact. Skin:  Intact without suspicious lesions or rashes Cervical Nodes:  No lymphadenopathy noted Axillary Nodes:  No palpable lymphadenopathy Inguinal Nodes:  No significant adenopathy Psych:  Cognition and judgment appear intact. Alert and cooperative with normal attention span and concentration. No apparent delusions, illusions, hallucinations   Impression & Recommendations:  Problem # 1:  DEPRESSION, MAJOR, MILD (ICD-296.21) Assessment Improved  Problem # 2:  ALLERGIC RHINITIS (ICD-477.9) Assessment: Improved  Her updated medication list for this problem includes:    Flonase 50 Mcg/act Susp (Fluticasone propionate) ..... Spray in each nostril two times a day    Allegra 180 Mg Tabs (Fexofenadine hcl) ..... Qd  Problem # 3:  HYPERLIPIDEMIA (ICD-272.4) Assessment: Improved  Her updated medication list for this problem includes:    Simvastatin 40 Mg Tabs (Simvastatin) ..... One by mouth daily  Orders: EKG w/ Interpretation ([address])  Problem # 4:  ESSENTIAL HYPERTENSION, BENIGN (ICD-401.1) Assessment: Improved  Her updated medication list for this problem includes:    Lasix 40 Mg Tabs (Furosemide) ..... One in am    Lasix 20 Mg Tabs (Furosemide) ..... One pm  Orders: EKG w/ Interpretation ([address])  Complete Medication  List: 1)  Cyclobenzaprine Hcl 10 Mg Tabs (Cyclobenzaprine hcl) .... Once daily 2)  Simvastatin 40 Mg Tabs (Simvastatin) .... One by mouth daily 3)  Flonase 50 Mcg/act Susp (Fluticasone propionate) .... Spray in each nostril two times a day 4)  Allegra 180 Mg Tabs (Fexofenadine hcl) .... Qd 5)  Celexa 40 Mg Tabs (Citalopram hydrobromide) .... One by mouth daily 6)  Xanax Xr 1 Mg Tb24 (Alprazolam) .... 2 by mouth daily 7)  Climara Pro 0.045-0.015 Mg/day Ptwk (Estradiol-levonorgestrel) .... Apply weekly 8)  Lasix 40 Mg Tabs (Furosemide) .... One in am 9)  Lasix 20 Mg Tabs (Furosemide) .... One pm 10)  Klor-con M20 20 Meq Tbcr (Potassium chloride crys cr) .... One bid 11)  Oxycodone Hcl 5 Mg Tabs (Oxycodone hcl) .... One once daily  Other Orders: Pneumococcal Vaccine ([address]) Admin 1st Vaccine ([address])  Patient Instructions: 1)  remember to walk 20 minutes daily. 2)  Schedule your mammogram. 3)  Take an Aspirin every day. 4)  use milk of Magnesia or prune juice as a stool softener.  Soak nightly for 15 minutes in warm water.  Apply over-the-counter hemorrhoidal cream after U. soak.  If after two weeks.  The hemorrhoids do not resolve call and see Dr. Julio Alm 5)  Please schedule a follow-up appointment in 1 year. Prescriptions: KLOR-CON M20 20 MEQ  TBCR (POTASSIUM CHLORIDE CRYS CR) one bid  #200 x 4   Entered and Authorized by:   Roderick Pee MD   Signed by:   Roderick Pee MD on 01/30/2009   Method used:   Print then Give to Patient   RxID:   [id number] LASIX 20 MG  TABS (FUROSEMIDE) one pm  #100 x 4   Entered and Authorized by:   Roderick Pee MD   Signed by:   Roderick Pee MD on 01/30/2009   Method used:   Print then Give to Patient   RxID:   [id number] LASIX 40 MG  TABS (FUROSEMIDE) one in am  #100 x 4   Entered and Authorized by:   Roderick Pee MD   Signed by:   Roderick Pee MD on 01/30/2009   Method used:   Print then Give to Patient   RxID:    [id number] CELEXA 40 MG  TABS (CITALOPRAM HYDROBROMIDE) one by mouth daily  #100 x 4   Entered and Authorized by:   Roderick Pee MD   Signed by:   Roderick Pee MD on 01/30/2009   Method used:   Print then Give to Patient   RxID:   [id number] ALLEGRA 180 MG  TABS (FEXOFENADINE HCL) QD  #100 x 4   Entered and Authorized by:   Roderick Pee MD   Signed by:   Roderick Pee MD on 01/30/2009   Method used:   Print then Give to Patient   RxID:   [id number] FLONASE 50 MCG/ACT  SUSP (FLUTICASONE PROPIONATE) SPRAY IN EACH NOSTRIL two times a day  #3 x 4   Entered and Authorized by:   Roderick Pee MD   Signed by:   Roderick Pee MD on 01/30/2009   Method used:   Print then Give to Patient   RxID:   [id number] SIMVASTATIN 40 MG TABS (SIMVASTATIN) one by mouth daily  #100 x 4   Entered and Authorized by:   Roderick Pee MD   Signed by:   Roderick Pee MD on 01/30/2009   Method used:   Print then Give to Patient   RxID:   [id number]    Immunization History:  Tetanus/Td Immunization History:    Tetanus/Td:  historical (03/17/2004)  Immunizations Administered:  Pneumonia Vaccine:    Vaccine Type: Pneumovax    Site: left deltoid    Mfr: Merck    Dose: 0.5 ml    Route: IM    Given by: Kern Reap [name]    Exp. Date: 11/15/2009    Lot #: [id number]    Physician counseled: yes

## 2010-11-12 NOTE — Progress Notes (Signed)
Summary: CPAP PRESSURE TOO HIGH  Phone Note Call from Patient   Caller: Patient Call For: [facility] Summary of Call: PT UNABLE TO SLEEP. CPAP PRESSURE TOO HIGH. PT REQUESTS THIS BE SET AT PREVIOUS SETTING ASAP. ([facility] [address] PT CELL [phone] Initial call taken by: Tivis Ringer, CNA,  September 07, 2010 4:04 PM  Follow-up for Phone Call        Spoke with pt and she states since pressure has been increased she has been unable to sleep with cpap. She states she has had a headache and sinus congestion since increase in pressure. She is requestign pressure be changed backto what it was prev on beofer increase. Please advise.Carron Curie [name]  September 07, 2010 4:18 PM   Additional Follow-up for Phone Call Additional follow up Details #1::        see if she tolerated the auto setting better when we had her on that for 2 weeks.  If so, put her back on auto to see how she does. Additional Follow-up by: Barbaraann Share MD,  September 07, 2010 5:52 PM    Additional Follow-up for Phone Call Additional follow up Details #2::    LMOMTCB x1. Abigail Miyamoto RN  September 08, 2010 8:55 AM  [facility] x 2 Vernie Murders  September 09, 2010 12:13 PM  spoke with pt and she states she did alot better n auto when she was on this setting, so order placed to put pt back on auto per [facility]. Pt aware.Carron Curie [name]  September 09, 2010 2:34 PM

## 2010-11-12 NOTE — Progress Notes (Signed)
Summary: MEDICATION request  Medications Added CYCLOBENZAPRINE HCL 10 MG TABS (CYCLOBENZAPRINE HCL)  SIMVASTATIN 40 MG TABS (SIMVASTATIN)  FLONASE 50 MCG/ACT  SUSP (FLUTICASONE PROPIONATE) SPRAY IN EACH NOSTRIL two times a day ALLEGRA 180 MG  TABS (FEXOFENADINE HCL) QD       Phone Note Call from Patient Call back at Home Phone [phone]   Caller: Patient Call For: Aaralyn Kil Summary of Call: PATIENT WAS GIVEN ALLEGRA D AND PATIENT CAN'T TAKE ANY DECONGESTANTS PLEASE CALL ALLEGRA Initial call taken by: Barnie Mort,  July 04, 2007 7:21 AM  Follow-up for Phone Call        please call, and Allegra 180 mg, number 100, 1 a day  refills x 4  Additional Follow-up for Phone Call Additional follow up Details #1::        Pt also requesting Rx for Flonase spray, 1 spray to each nostril two times a day  Please mail both Rx to pt home [address], [place] [address]  call pt. get name of pharmacy we do elec now. Additional Follow-up by: Sid Falcon LPN,  July 04, 2007 5:26 PM    Additional Follow-up for Phone Call Additional follow up Details #2::    RX PRINTED AND SIGNED BY DR Kiyoshi Schaab, PT AWARE. SHE WILL P/U RX. Follow-up by: Arcola Jansky, RN,  July 06, 2007 8:44 AM  New/Updated Medications: CYCLOBENZAPRINE HCL 10 MG TABS (CYCLOBENZAPRINE HCL)  SIMVASTATIN 40 MG TABS (SIMVASTATIN)  FLONASE 50 MCG/ACT  SUSP (FLUTICASONE PROPIONATE) SPRAY IN EACH NOSTRIL two times a day ALLEGRA 180 MG  TABS (FEXOFENADINE HCL) QD   Prescriptions: FLONASE 50 MCG/ACT  SUSP (FLUTICASONE PROPIONATE) SPRAY IN EACH NOSTRIL two times a day  #3 x 3   Entered by:   Arcola Jansky, RN   Authorized by:   Roderick Pee MD   Signed by:   Arcola Jansky, RN on 07/05/2007   Method used:   Print then Give to Patient   RxID:   [id number] ALLEGRA 180 MG  TABS (FEXOFENADINE HCL) QD  #100 x 3   Entered by:   Arcola Jansky, RN   Authorized by:   Roderick Pee MD   Signed by:   Arcola Jansky, RN on 07/05/2007   Method used:   Print then Give to Patient   RxID:   [id number]

## 2010-11-12 NOTE — Assessment & Plan Note (Signed)
Summary: sinus/mhf   Vital Signs:  Patient Profile:   55 Years Old Female Weight:      217 pounds Temp:     98.2 degrees F Resp:     12 per minute BP sitting:   124 / 82  Vitals Entered By: Lynann Beaver [name] (November 27, 2007 4:18 PM)                 Chief Complaint:  c/o sinus congestion and joint pain.  History of Present Illness: Erica [name] is a 55 year old female comes in today with a week's history of head congestion running nose cough.  She has an effusion is not vomiting, diarrhea, or sputum production.  She is a nonsmoker    Past Medical History:    Reviewed history and no changes required:       hypertension       PAF       high cholesterol       panic attacks       right total knee hysterectomy with right oophorectomy.  No cancer       childbirth x 3   Social History:    Reviewed history and no changes required:       Married       Never Smoked       Alcohol use-no       Drug use-no       Regular exercise-no   Risk Factors:  Tobacco use:  never Drug use:  no Alcohol use:  no Exercise:  no   Review of Systems      See HPI   Physical Exam  General:     Well-developed,well-nourished,in no acute distress; alert,appropriate and cooperative throughout examination Head:     Normocephalic and atraumatic without obvious abnormalities. No apparent alopecia or balding. Eyes:     No corneal or conjunctival inflammation noted. EOMI. Perrla. Funduscopic exam benign, without hemorrhages, exudates or papilledema. Vision grossly normal. Ears:     External ear exam shows no significant lesions or deformities.  Otoscopic examination reveals clear canals, tympanic membranes are intact bilaterally without bulging, retraction, inflammation or discharge. Hearing is grossly normal bilaterally. Nose:     External nasal examination shows no deformity or inflammation. Nasal mucosa are pink and moist without lesions or exudates. Mouth:     Oral mucosa and  oropharynx without lesions or exudates.  Teeth in good repair. Neck:     No deformities, masses, or tenderness noted. Lungs:     Normal respiratory effort, chest expands symmetrically. Lungs are clear to auscultation, no crackles or wheezes.    Impression & Recommendations:  Problem # 1:  UPPER RESPIRATORY INFECTION, VIRAL (ICD-465.9) Assessment: New  Her updated medication list for this problem includes:    Allegra 180 Mg Tabs (Fexofenadine hcl) ..... Qd    Hycodan 5-1.5 Mg/47ml Syrp (Hydrocodone-homatropine) .Marland Kitchen... 1 or 2 tsps. three times a day as needed   Complete Medication List: 1)  Cyclobenzaprine Hcl 10 Mg Tabs (Cyclobenzaprine hcl) 2)  Simvastatin 40 Mg Tabs (Simvastatin) .... One by mouth daily 3)  Flonase 50 Mcg/act Susp (Fluticasone propionate) .... Spray in each nostril two times a day 4)  Allegra 180 Mg Tabs (Fexofenadine hcl) .... Qd 5)  Celexa 40 Mg Tabs (Citalopram hydrobromide) .... One by mouth daily 6)  Alprazolam 1 Mg Tabs (Alprazolam) .... 2 q hs 7)  Xanax Xr 1 Mg Tb24 (Alprazolam) .... 2 by mouth daily 8)  Climara Pro 0.045-0.015 Mg/day Ptwk (Estradiol-levonorgestrel) .Marland KitchenMarland KitchenMarland Kitchen  Apply weekly 9)  Lasix 40 Mg Tabs (Furosemide) .... One in am 10)  Lasix 20 Mg Tabs (Furosemide) .... One pm 11)  Klor-con M20 20 Meq Tbcr (Potassium chloride crys cr) .... One bid 12)  Hycodan 5-1.5 Mg/44ml Syrp (Hydrocodone-homatropine) .Marland Kitchen.. 1 or 2 tsps. three times a day as needed   Patient Instructions: 1)  Get plenty of rest, drink lots of clear liquids, and use tylenol or Ibuprophen for fever and comfort. return in 7-10 days if you're not better:sooner if you're feeliong worse.    Prescriptions: HYCODAN 5-1.5 MG/5ML  SYRP (HYDROCODONE-HOMATROPINE) 1 or 2 tsps. three times a day as needed  #8 oz. x 1   Entered and Authorized by:   Roderick Pee MD   Signed by:   Roderick Pee MD on 11/27/2007   Method used:   Print then Give to Patient   RxID:   [id number]  ]

## 2010-11-12 NOTE — Assessment & Plan Note (Signed)
Summary: rov to review sleep study.   Copy to:  Rene Paci Primary Provider/Referring Provider:  [name]  CC:  Pt is here for a f/u appt to discuss sleep study results.  .  History of Present Illness: the pt comes in today for f/u of her recent sleep study.  She was found to have mild osa with AHI of 10/hr and desat as low as 83%.   I have gone over the study with her in detail, and answered all of her questions.    Current Medications (verified): 1)  Cyclobenzaprine Hcl 10 Mg Tabs (Cyclobenzaprine Hcl) .... Once Daily 2)  Simvastatin 40 Mg Tabs (Simvastatin) .... One By Mouth Daily 3)  Flonase 50 Mcg/act  Susp (Fluticasone Propionate) .... Spray in Each Nostril Two Times A Day 4)  Allegra 180 Mg  Tabs (Fexofenadine Hcl) .... Qd 5)  Celexa 40 Mg  Tabs (Citalopram Hydrobromide) .... One By Mouth Daily 6)  Xanax Xr 1 Mg  Tb24 (Alprazolam) .... 2 By Mouth Daily 7)  Climara Pro 0.045-0.015 Mg/day  Ptwk (Estradiol-Levonorgestrel) .... Apply Weekly 8)  Lasix 40 Mg  Tabs (Furosemide) .... One in Am 9)  Lasix 20 Mg  Tabs (Furosemide) .... One Pm 10)  Klor-Con M20 20 Meq  Tbcr (Potassium Chloride Crys Cr) .... One Bid 11)  Oxycodone Hcl 5 Mg  Tabs (Oxycodone Hcl) .... Take 1 Tablet By Mouth Once A Day As Needed 12)  Fish Oil 1000 Mg Caps (Omega-3 Fatty Acids) .... Take 1 By Mouth Qd 13)  Miralax  Powd (Polyethylene Glycol 3350) .Marland Kitchen.. 17g By Mouth Two Times A Day in 8oz Drink 14)  Analpram-Hc 1-2.5 % Lotn (Hydrocortisone Ace-Pramoxine) .... Apply To Rectum Two Times A Day As Needed For Pain/hemrrhoids 15)  Lotrisone 1-0.05 % Crea (Clotrimazole-Betamethasone) .... Apply To Affected Areas/itch Two Times A Day X 7 Days, Then As Needed  Allergies: 1)  ! Guaifenesin-Codeine (Guaifenesin-Codeine)  Vital Signs:  Patient profile:   55 year old female Height:      66 inches Weight:      240.25 pounds BMI:     38.92 O2 Sat:      98 % on Room air Temp:     98.1 degrees F  oral Pulse rate:   74 / minute BP sitting:   112 / 80  (left arm) Cuff size:   regular  Vitals Entered By: Arman Filter LPN (December 03, 2009 3:22 PM)  O2 Flow:  Room air CC: Pt is here for a f/u appt to discuss sleep study results.   Comments Medications reviewed with patient Arman Filter LPN  December 03, 2009 3:22 PM    Physical Exam  General:  ow female in nad   Impression & Recommendations:  Problem # 1:  OBSTRUCTIVE SLEEP APNEA (ICD-327.23) the pt has mild osa by her recent sleep study, but is very unhappy with her sleep quality and how she feels during the day.  I have outlined various treatment options, including a trial of weight loss alone, upper airway surgery, dental appliance, and cpap.  At this point, she wants to try cpap while she is working on weight loss.  I will start her at a low pressure to allow for desensitization, then optimize pressure at a later date.  She will followup with me in 4 weeks.  Time spent with pt today was  Medications Added to Medication List This Visit: 1)  Oxycodone Hcl 5 Mg Tabs (Oxycodone hcl) .Marland KitchenMarland KitchenMarland Kitchen  Take 1 tablet by mouth once a day as needed  Other Orders: Est. Patient Level III (57846) DME Referral (DME)  Patient Instructions: 1)  will set up on cpap.  Let me know if tolerance issues. 2)  work on weight loss 3)  followup with me in 4 weeks.

## 2010-11-12 NOTE — Progress Notes (Signed)
Summary: NEW RX   Phone Note Call from Patient Call back at [phone]   Caller: PT LIVE Call For: Erica [name] Summary of Call: [facility] AND [facility] TOLD [facility] THAT THEY DO NOT HAVE LASIX IN 20MG .  SO SHE NEEDS A NEW RX FOR LASIX 40MG  AND SHE CAN CUT THEM IN HALF.  PATIENT WOULD LIKE YOU TO MAIL HER RX TO HER HOME. Initial call taken by: Celine Ahr,  February 04, 2009 10:06 AM  Follow-up for Phone Call        mailed Follow-up by: Kern Reap [name],  February 04, 2009 4:01 PM      Prescriptions: LASIX 40 MG  TABS (FUROSEMIDE) one in am  #100 x 4   Entered by:   Kern Reap [name]   Authorized by:   Roderick Pee MD   Signed by:   Kern Reap [name] on 02/04/2009   Method used:   Print then Give to Patient   RxID:   [id number]

## 2010-11-12 NOTE — Procedures (Signed)
Summary: COLON   Colonoscopy  Procedure date:  04/20/2004  Findings:      Location:  [facility].    Procedures Next Due Date:    Colonoscopy: 04/2014 Patient Name: Erica, [name] MRN:  Procedure Procedures: Colonoscopy CPT: 19147.  Personnel: Endoscopist: Dora L. Juanda Chance, MD.  Referred By: Eugenio Hoes Tawanna Cooler, MD.  Exam Location: Exam performed in Outpatient Clinic. Outpatient  Patient Consent: Procedure, Alternatives, Risks and Benefits discussed, consent obtained, from patient. Consent was obtained by the RN.  Indications  Average Risk Screening Routine.  History  Current Medications: Patient is not currently taking Coumadin.  Pre-Exam Physical: Performed Apr 20, 2004. Entire physical exam was normal.  Exam Exam: Extent of exam reached: Cecum, extent intended: Cecum.  The cecum was identified by appendiceal orifice and IC valve. Colon retroflexion performed. Images taken. ASA Classification: I. Tolerance: good.  Monitoring: Pulse and BP monitoring, Oximetry used. Supplemental O2 given.  Colon Prep Used Miralax for colon prep. Prep results: good.  Sedation Meds: Patient assessed and found to be appropriate for moderate (conscious) sedation. Fentanyl 100 mcg. given IV. Versed 10 mg. given IV.  Findings NORMAL EXAM: Cecum.   Assessment Normal examination.  Comments: no polyps Events  Unplanned Interventions: No intervention was required.  Unplanned Events: There were no complications. Plans Patient Education: Patient given standard instructions for: Yearly hemoccult testing recommended. Patient instructed to get routine colonoscopy every 10 years.  Disposition: After procedure patient sent to recovery. After recovery patient sent home.   This report was created from the original endoscopy report, which was reviewed and signed by the above listed endoscopist.

## 2010-11-12 NOTE — Progress Notes (Signed)
Summary: Rx request  Phone Note Call from Patient   Caller: Pt walk-in Summary of Call: pt came into clinic requesting written Rxs from [facility] 10/17/2009 to take to [facility] pharmacy because [facility] was too expensive. Rx printed, MD signed. Rx given to pt directly Initial call taken by: Margaret Pyle, [name],  October 31, 2009 10:28 AM    Prescriptions: ANALPRAM-HC 1-2.5 % LOTN (HYDROCORTISONE ACE-PRAMOXINE) apply to rectum two times a day as needed for pain/hemrrhoids  #1 x 0   Entered by:   Margaret Pyle, [name]   Authorized by:   [name]   Signed by:   Margaret Pyle, [name] on 10/31/2009   Method used:   Print then Give to Patient   RxID:   [id number] MIRALAX  POWD (POLYETHYLENE GLYCOL 3350) 17g by mouth two times a day in 8oz drink  #100 x 1   Entered by:   Margaret Pyle, [name]   Authorized by:   [name]   Signed by:   Margaret Pyle, [name] on 10/31/2009   Method used:   Print then Give to Patient   RxID:   [id number]

## 2010-11-12 NOTE — Assessment & Plan Note (Signed)
Summary: consult for osa   Copy to:  Rene Paci Primary Provider/Referring Provider:  [name]  CC:  Sleep Consult.  History of Present Illness: The pt is a 55y/o female who I have been asked to see for possible osa.  She has been noted to have loud snoring during sleep, and her husband has moved to a different room.  He has never mentioned pauses in her breathing during sleep.  She typically goes to bed btw 10-11pm, and arises at 6am to start her day.  She is not rested upon arising, and also notes restless sleep during the night.  She does have sleep pressure during the day with reading, but overall denies alertness issues.  She does c/o being tired a lot.  She can watch tv or read in the evening without dozing, and denies any signficant issues with sleepiness while driving.  Her weight is up 60 pounds over the last 2 years,  and her epworth score today is only 7.  Medications Prior to Update: 1)  Cyclobenzaprine Hcl 10 Mg Tabs (Cyclobenzaprine Hcl) .... Once Daily 2)  Simvastatin 40 Mg Tabs (Simvastatin) .... One By Mouth Daily 3)  Flonase 50 Mcg/act  Susp (Fluticasone Propionate) .... Spray in Each Nostril Two Times A Day 4)  Allegra 180 Mg  Tabs (Fexofenadine Hcl) .... Qd 5)  Celexa 40 Mg  Tabs (Citalopram Hydrobromide) .... One By Mouth Daily 6)  Xanax Xr 1 Mg  Tb24 (Alprazolam) .... 2 By Mouth Daily 7)  Climara Pro 0.045-0.015 Mg/day  Ptwk (Estradiol-Levonorgestrel) .... Apply Weekly 8)  Lasix 40 Mg  Tabs (Furosemide) .... One in Am 9)  Lasix 20 Mg  Tabs (Furosemide) .... One Pm 10)  Klor-Con M20 20 Meq  Tbcr (Potassium Chloride Crys Cr) .... One Bid 11)  Oxycodone Hcl 5 Mg  Tabs (Oxycodone Hcl) .... One Once Daily 12)  Fish Oil 1000 Mg Caps (Omega-3 Fatty Acids) .... Take 1 By Mouth Qd 13)  Miralax  Powd (Polyethylene Glycol 3350) .Marland Kitchen.. 17g By Mouth Two Times A Day in 8oz Drink 14)  Analpram-Hc 1-2.5 % Lotn (Hydrocortisone Ace-Pramoxine) .... Apply To Rectum Two  Times A Day As Needed For Pain/hemrrhoids  Allergies (verified): 1)  ! Guaifenesin-Codeine (Guaifenesin-Codeine) 2)  ! 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  No cancer childbirth x 3  Family History: Reviewed history from 10/17/2009 and no changes required. dad - 55, dementia s/p THR s/p fx 2010 - not doing well mom - "mass in spine"  allergies: 2 sisters asthma: son and grandson heart disease: mother   Social History: Reviewed history from 11/27/2007 and no changes required. Married with children. Never Smoked pt is on disability.  previously worked in the school system.  Alcohol use-no Drug use-no Regular exercise-no  Review of Systems       The patient complains of irregular heartbeats, loss of appetite, weight change, tooth/dental problems, itching, anxiety, depression, joint stiffness or pain, and change in color of mucus.  The patient denies shortness of breath with activity, shortness of breath at rest, productive cough, non-productive cough, coughing up blood, chest pain, acid heartburn, indigestion, abdominal pain, difficulty swallowing, sore throat, headaches, nasal congestion/difficulty breathing through nose, sneezing,  ear ache, hand/feet swelling, rash, and fever.    Vital Signs:  Patient profile:   55 year old female Height:      66 inches Weight:      238.25 pounds BMI:     38.59 O2 Sat:      97 % on Room air Temp:     98.1 degrees F oral Pulse rate:   76 / minute BP sitting:   118 / 78  (right arm) Cuff size:   large  Vitals Entered By: Arman Filter LPN (October 31, 2009 9:26 AM)  O2 Flow:  Room air CC: Sleep Consult Comments Medications reviewed with patient Arman Filter LPN  October 31, 2009 9:26 AM    Physical Exam  General:  obese female in nad Eyes:  PERRLA and EOMI.   Nose:  turbinate hypertrophy bilat Mouth:  long palate and uvula Neck:  no jvd, tmg, LN Lungs:  clear to auscultation Heart:  rrr, no mrg Abdomen:  soft and nontender, bs+ Extremities:  no edema or cyanosis pulses intact distally Neurologic:  alert and oriented, moves all 4.   Impression & Recommendations:  Problem # 1:  OBSTRUCTIVE SLEEP APNEA (ICD-327.23) the pt's history is suspicious for osa, although she is not overly symptomatic during the day by her description.  She does have disrupted sleep, and would like to improve this if able.  I have had a long discussion with her about osa, including its impact on her QOL and CV health.  She will need a sleep study for diagnosis, and the pt is aggeeable.  I have also encouraged her to start working on weight loss.  Other Orders: Consultation Level IV (09811) Sleep Disorder Referral (Sleep Disorder)  Patient Instructions: 1)  will schedule for sleep study.  Will arrange followup once results availbable. 2)  work on weight loss.

## 2010-11-12 NOTE — Assessment & Plan Note (Signed)
Summary: PER PT PHYSICAL---STC   Vital Signs:  Patient profile:   55 year old female Height:      68 inches (172.72 cm) Weight:      233.4 pounds (106.09 kg) O2 Sat:      98 % on Room air Temp:     98.8 degrees F (37.11 degrees C) oral Pulse rate:   72 / minute BP sitting:   110 / 74  (left arm) Cuff size:   large  Vitals Entered By: Orlan Leavens RMA (July 01, 2010 8:20 AM)  O2 Flow:  Room air CC: CPX Is Patient Diabetic? No Pain Assessment Patient in pain? no        Primary Care Provider:  [name]  CC:  CPX.  History of Present Illness: patient is here today for annual physical. Patient feels well and has no complaints.  also review chronic med issues: constipation -seeing GI for same a/w abd bloating but no pain in abd or rectum - very small hard bowel "pellets" and gas, no melena or diarrhea - reports no improvement symptoms with milk of mag or sennakot plus - no n/v - no fever denies use of narcotics or change in meds  OSA - still c/o fatigue - ongoing issues with mask fit reviewed - seeing pulm for same - family notes less snoring with CPAP -   HTN - reports compliance with ongoing medical treatment and no changes in medication dose or frequency. denies adverse side effects related to current therapy. trace edema, no CP or HA, no weakness or vison changes  dyslipidemia - reports compliance with ongoing medical treatment and no changes in medication dose or frequency. denies adverse side effects related to current therapy.  no GI or muscle c/o  fatigue - feels tired all the time - see OSA above - no weight gain or loss, no fever, no abd pain, no rash or joint swelling - still with constipation (no bowel changes) - denies depression symptoms active at this time  back pain - seeing [facility] for same - due for steroid shot in back soon - no radiation down legs or change in nature of pain  depression - reports counselor recently tried generic  wellbutrin - increased tearfulness so pt stopped - still on citalopram daily and xanax as needed  - no SI/HI  Preventive Screening-Counseling & Management  Alcohol-Tobacco     Alcohol drinks/day: 0     Alcohol Counseling: not indicated; patient does not drink     Smoking Status: never     Tobacco Counseling: not indicated; no tobacco use  Caffeine-Diet-Exercise     Diet Counseling: to improve diet; diet is suboptimal     Does Patient Exercise: no     Exercise Counseling: to improve exercise regimen     Depression Counseling: further diagnostic testing and/or other treatment is indicated  Safety-Violence-Falls     Seat Belt Counseling: not indicated; patient wears seat belts     Helmet Counseling: not indicated; patient wears helmet when riding bicycle/motocycle     Firearm Counseling: not applicable     Violence Counseling: not indicated; no violence risk noted     Fall Risk Counseling: not indicated; no significant falls noted  Clinical Review Panels:  Prevention   Last Mammogram:  ASSESSMENT: Negative - BI-RADS 1^MM DIGITAL SCREENING (02/23/2010)   Last Colonoscopy:  Location:  [facility].  (04/20/2004)  Immunizations   Last Tetanus Booster:  Historical (03/17/2004)   Last Pneumovax:  Pneumovax (01/30/2009)  Lipid Management   Cholesterol:  158 (06/29/2010)   LDL (bad choesterol):  99 (06/29/2010)   HDL (good cholesterol):  41.00 (06/29/2010)   Triglycerides:  51 (09/20/2006)  CBC   WBC:  6.1 (06/29/2010)   RBC:  4.02 (06/29/2010)   Hgb:  11.8 (06/29/2010)   Hct:  35.0 (06/29/2010)   Platelets:  218.0 (06/29/2010)   MCV  87.2 (06/29/2010)   MCHC  33.7 (06/29/2010)   RDW  14.9 (06/29/2010)   PMN:  66.4 (06/29/2010)   Lymphs:  24.7 (06/29/2010)   Monos:  7.6 (06/29/2010)   Eosinophils:  0.9 (06/29/2010)   Basophil:  0.4 (06/29/2010)  Complete Metabolic Panel   Glucose:  94 (06/29/2010)   Sodium:  141 (06/29/2010)   Potassium:  4.3 (06/29/2010)    Chloride:  105 (06/29/2010)   CO2:  29 (06/29/2010)   BUN:  12 (06/29/2010)   Creatinine:  0.7 (06/29/2010)   Albumin:  3.4 (06/29/2010)   Total Protein:  6.4 (06/29/2010)   Calcium:  9.1 (06/29/2010)   Total Bili:  0.2 (06/29/2010)   Alk Phos:  130 (06/29/2010)   SGPT (ALT):  13 (06/29/2010)   SGOT (AST):  17 (06/29/2010)   Current Medications (verified): 1)  Cyclobenzaprine Hcl 10 Mg Tabs (Cyclobenzaprine Hcl) .... Once Daily 2)  Simvastatin 40 Mg Tabs (Simvastatin) .... One By Mouth Daily 3)  Flonase 50 Mcg/act  Susp (Fluticasone Propionate) .... Spray in Each Nostril Two Times A Day 4)  Allegra 180 Mg  Tabs (Fexofenadine Hcl) .... Qd 5)  Celexa 40 Mg  Tabs (Citalopram Hydrobromide) .... One By Mouth Daily 6)  Xanax Xr 1 Mg  Tb24 (Alprazolam) .... 2 By Mouth Daily 7)  Climara Pro 0.045-0.015 Mg/day  Ptwk (Estradiol-Levonorgestrel) .... Apply Weekly 8)  Lasix 40 Mg  Tabs (Furosemide) .... One in Am 9)  Lasix 20 Mg  Tabs (Furosemide) .... One Pm 10)  Klor-Con M20 20 Meq  Tbcr (Potassium Chloride Crys Cr) .... One Bid 11)  Oxycodone Hcl 5 Mg  Tabs (Oxycodone Hcl) .... Take 1 Tablet By Mouth Once A Day As Needed 12)  Fish Oil 1000 Mg Caps (Omega-3 Fatty Acids) .... Take 1 By Mouth Qd 13)  Reglan 10 Mg Tabs (Metoclopramide Hcl) .Marland Kitchen.. 1 By Mouth Four Times A Day As Needed For Nausea 14)  Cpap Machine .... At Bedtime  Allergies (verified): 1)  ! Guaifenesin-Codeine (Guaifenesin-Codeine)  Past History:  Past medical, surgical, family and social histories (including risk factors) reviewed, and no changes noted (except as noted below).  Past Medical History: hypertension PAF dyslipidemia panic attacks, depression hx OA allergic rhinitis obesity  OSA - CPAP hs hypertension  MD roster: pulm - [facility] - [facility]- [facility]- counseling- [facility]       Past Surgical History: Reviewed history from 06/18/2010 and no changes required. right total knee  replacement hysterectomy  right oophorectomy.  No cancer childbirth x 3  Family History: Reviewed history from 06/23/2010 and no changes required. dad - 24, dementia s/p THR s/p fx 2010 - not doing well mom - "mass in spine" allergies: 2 sisters asthma: son and grandson heart disease: mother  Family History of Breast Cancer: Maternal Aunt Family History of Colon Cancer:Maternal Cousin  Social History: Reviewed history from 06/23/2010 and no changes required. Married with children. Never Smoked pt is on disability.  previously worked in the school system.  Alcohol use-no Drug use-no Regular exercise-no Daily Caffeine Use: pepsi and green tea  daily   Review of Systems       see HPI above. I have reviewed all other systems and they were negative.   Physical Exam  General:  overweight-appearing but alert, well-developed, well-nourished, and cooperative to examination.   uncomfortable bt nontoxic Head:  Normocephalic and atraumatic without obvious abnormalities. No apparent alopecia or balding. Eyes:  vision grossly intact; pupils equal, round and reactive to light.  conjunctiva and lids normal.    Ears:  normal pinnae bilaterally, without erythema, swelling, or tenderness to palpation. TMs clear, without effusion, or cerumen impaction. Hearing grossly normal bilaterally  Mouth:  teeth and gums in good repair; mucous membranes moist, without lesions or ulcers. oropharynx clear without exudate, no erythema.  Neck:  supple, full ROM, no masses, no thyromegaly, no thyroid nodules or tenderness, no JVD, and no cervical lymphadenopathy.   Lungs:  normal respiratory effort, no intercostal retractions or use of accessory muscles; normal breath sounds bilaterally - no crackles and no wheezes.    Heart:  normal rate, regular rhythm, no murmur, and no rub. BLE without edema. Abdomen:  soft, non-tender, present but reduced bowel sounds, mild distention, no masses, no guarding, no rigidity, no  hepatomegaly, and no splenomegaly.   Genitalia:  defer gyn Msk:  No deformity or scoliosis noted of thoracic or lumbar spine.   Neurologic:  No cranial nerve deficits noted. Station and gait are normal. Plantar reflexes are down-going bilaterally. DTRs are symmetrical throughout. Sensory, motor and coordinative functions appear intact. Skin:  no rashes, vesicles, ulcers, or erythema. No nodules or irregularity to palpation.  Psych:  Oriented X3, memory intact for recent and remote, normally interactive, good eye contact, dysphoric but not anxious appearing, mild depressed appearing, and not agitated.      Impression & Recommendations:  Problem # 1:  PHYSICAL EXAMINATION (ICD-V70.0) Patient has been counseled on age-appropriate routine health concerns for screening and prevention. These are reviewed and up-to-date. Immunizations are up-to-date or declined. Labs and ECG reviewed.  Orders: EKG w/ Interpretation (93000) Gynecologic Referral (Gyn)  Complete Medication List: 1)  Cyclobenzaprine Hcl 10 Mg Tabs (Cyclobenzaprine hcl) .... Once daily 2)  Simvastatin 40 Mg Tabs (Simvastatin) .... One by mouth daily 3)  Flonase 50 Mcg/act Susp (Fluticasone propionate) .... Spray in each nostril two times a day 4)  Allegra 180 Mg Tabs (Fexofenadine hcl) .... Qd 5)  Celexa 40 Mg Tabs (Citalopram hydrobromide) .... One by mouth daily 6)  Xanax Xr 1 Mg Tb24 (Alprazolam) .... 2 by mouth daily 7)  Climara Pro 0.045-0.015 Mg/day Ptwk (Estradiol-levonorgestrel) .... Apply weekly 8)  Lasix 40 Mg Tabs (Furosemide) .... One in am 9)  Lasix 20 Mg Tabs (Furosemide) .... One pm 10)  Klor-con M20 20 Meq Tbcr (Potassium chloride crys cr) .... One bid 11)  Oxycodone Hcl 5 Mg Tabs (Oxycodone hcl) .... Take 1 tablet by mouth once a day as needed 12)  Fish Oil 1000 Mg Caps (Omega-3 fatty acids) .... Take 1 by mouth qd 13)  Reglan 10 Mg Tabs (Metoclopramide hcl) .Marland Kitchen.. 1 by mouth four times a day as needed for nausea 14)   Cpap Machine  .... At bedtime  Contraindications/Deferment of Procedures/Staging:    Test/Procedure: FLU VAX    Reason for deferment: patient declined   Patient Instructions: 1)  it was good to see you today. 2)  exam, EKG and labs all look good - no changes recomended today 3)  we'll make referral to gynecology. Our office will contact you  regarding this appointment once made.  4)  keep appointments/tests as planned with other specialists as reviewed today 5)  refills provided for mail off  6)  Please schedule a follow-up appointment in 6 months to monitor blood pressure, cholesterol and depression symptoms; call sooner if problems.  Prescriptions: KLOR-CON M20 20 MEQ  TBCR (POTASSIUM CHLORIDE CRYS CR) one bid  #180 x 3   Entered and Authorized by:   [name]   Signed by:   [name] on 07/01/2010   Method used:   Print then Give to Patient   RxID:   [id number] LASIX 20 MG  TABS (FUROSEMIDE) one pm  #90 x 3   Entered and Authorized by:   [name]   Signed by:   [name] on 07/01/2010   Method used:   Print then Give to Patient   RxID:   [id number] LASIX 40 MG  TABS (FUROSEMIDE) one in am  #90 x 3   Entered and Authorized by:   [name]   Signed by:   [name] on 07/01/2010   Method used:   Print then Give to Patient   RxID:   [id number] CELEXA 40 MG  TABS (CITALOPRAM HYDROBROMIDE) one by mouth daily  #90 x 3   Entered and Authorized by:   [name]   Signed by:   [name] on 07/01/2010   Method used:   Print then Give to Patient   RxID:   [id number] QQVZDGL 50 MCG/ACT  SUSP (FLUTICASONE PROPIONATE) SPRAY IN EACH NOSTRIL two times a day  #4 x 3   Entered and Authorized by:   [name]   Signed by:   [name] on 07/01/2010   Method used:   Print then Give to Patient   RxID:   [id number] SIMVASTATIN 40 MG TABS (SIMVASTATIN)  one by mouth daily  #90 x 3   Entered and Authorized by:   [name]   Signed by:   [name] on 07/01/2010   Method used:   Print then Give to Patient   RxID:   [id number] CYCLOBENZAPRINE HCL 10 MG TABS (CYCLOBENZAPRINE HCL) once daily  #90 x 3   Entered and Authorized by:   [name]   Signed by:   [name] on 07/01/2010   Method used:   Print then Give to Patient   RxID:   [id number]

## 2010-11-12 NOTE — Progress Notes (Signed)
Summary: need to sched ov with kc   ---- Converted from flag ---- ---- 12/01/2009 10:43 AM, Arman Filter LPN wrote: pt needs ov with kc to discuss sleep study results. ------------------------------  [id number].  Aundra Millet Americo Vallery LPN  December 01, 2009 10:43 AM  pt scheduled to see [facility] 12-03-2009 at 3:15pm  Arman Filter LPN  December 02, 2009 3:02 PM

## 2010-11-12 NOTE — Progress Notes (Signed)
Summary: triage  Phone Note From Other Clinic Call back at [phone]   Caller: Stanton Kidney, scheduler Call For: Dr. Juanda Chance Reason for Call: Schedule Patient Appt Summary of Call: Dr. Felicity Coyer would like pt seen for chronic constipation... no openings available, ok to see NP or PA Initial call taken by: Vallarie Mare,  June 09, 2010 11:21 AM  Follow-up for Phone Call        NP3 scheduled with [facility] 06/23/10 9:00.  New patient letter mailed to patient's home Follow-up by: Darcey Nora RN, CGRN,  June 09, 2010 11:43 AM

## 2010-11-12 NOTE — Progress Notes (Signed)
Summary: referral  Phone Note Call from Patient Call back at Home Phone [phone]   Caller: Patient Reason for Call: Talk to Nurse Summary of Call: needs referral to gyn dr, pt states she has never been to [facility] prior gyn order), request referral to another dr Initial call taken by: Migdalia Dk,  August 10, 2010 4:15 PM  Follow-up for Phone Call        gyn refer requested Follow-up by: [name],  August 11, 2010 8:12 AM  Additional Follow-up for Phone Call Additional follow up Details #1::        Pt advised via VM Additional Follow-up by: Margaret Pyle, [name],  August 11, 2010 10:14 AM

## 2010-11-12 NOTE — Miscellaneous (Signed)
Summary: optimal cpap pressure 14cm  Clinical Lists Changes  Orders: Added new Referral order of DME Referral (DME) - Signed autodownload shows adequate compliance, moderate leak but ok, and optimal pressure 14cm

## 2010-11-12 NOTE — Letter (Signed)
Summary: New Patient letter  [facility]  [address] [place], [place] [address]   Phone: [phone]  Fax: [phone]       06/09/2010 MRN: [id number]  Erica [name] [address] [place], [place]  [address]  Dear Ms. Custard,  Welcome to the Gastroenterology Division at [facility].    You are scheduled to see Dr.  Juanda Chance on 06/23/10 at 9:00 a.m.  on the 3rd floor at [facility], [address].  We ask that you try to arrive at our office 15 minutes prior to your appointment time to allow for check-in.  We would like you to complete the enclosed self-administered evaluation form prior to your visit and bring it with you on the day of your appointment.  We will review it with you.  Also, please bring a complete list of all your medications or, if you prefer, bring the medication bottles and we will list them.  Please bring your insurance card so that we may make a copy of it.  If your insurance requires a referral to see a specialist, please bring your referral form from your primary care physician.  Co-payments are due at the time of your visit and may be paid by cash, check or credit card.     Your office visit will consist of a consult with your physician (includes a physical exam), any laboratory testing he/she may order, scheduling of any necessary diagnostic testing (e.g. x-ray, ultrasound, CT-scan), and scheduling of a procedure (e.g. Endoscopy, Colonoscopy) if required.  Please allow enough time on your schedule to allow for any/all of these possibilities.    If you cannot keep your appointment, please call [phone] to cancel or reschedule prior to your appointment date.  This allows [place] the opportunity to schedule an appointment for another patient in need of care.  If you do not cancel or reschedule by 5 p.m. the business day prior to your appointment date, you will be charged a $50.00 late cancellation/no-show fee.    Thank you for choosing  [facility] for your medical needs.  We appreciate the opportunity to care for you.  Please visit [place] at our website  to learn more about our practice.                     Sincerely,                                                             The Gastroenterology Division

## 2010-11-12 NOTE — Assessment & Plan Note (Signed)
Summary: rov for osa   Copy to:  Erica [name] Primary Provider/Referring Provider:  [name]  CC:  6 month follow up. pt states uses cpap 7/7 night x 6-8 hrs a nights. Pt states her cpap has been giving her headaches. Pt states she has been having palpitations. .  History of Present Illness: the pt comes in today for f/u of her osa.  She is trying to wear cpap compliantly, but is having multiple tolerance issues.  She has not had her cpap pressure increased to the optimal level as of yet, and is describing breakthru events.  She is having headaches at times, and she may be pulling mask too tight.  She is having nasal congestion issues, but does not know how to operate her heated humidifier.  She continues to have disrupted sleep, and is not feeling rested the next day.  Current Medications (verified): 1)  Cyclobenzaprine Hcl 10 Mg Tabs (Cyclobenzaprine Hcl) .... Once Daily 2)  Simvastatin 40 Mg Tabs (Simvastatin) .... One By Mouth Daily 3)  Flonase 50 Mcg/act  Susp (Fluticasone Propionate) .... Spray in Each Nostril Two Times A Day 4)  Allegra 180 Mg  Tabs (Fexofenadine Hcl) .... Qd 5)  Celexa 40 Mg  Tabs (Citalopram Hydrobromide) .... One By Mouth Daily 6)  Xanax Xr 1 Mg  Tb24 (Alprazolam) .... 2 By Mouth Daily 7)  Climara Pro 0.045-0.015 Mg/day  Ptwk (Estradiol-Levonorgestrel) .... Apply Weekly 8)  Lasix 40 Mg  Tabs (Furosemide) .... One in Am 9)  Lasix 20 Mg  Tabs (Furosemide) .... One Pm 10)  Klor-Con M20 20 Meq  Tbcr (Potassium Chloride Crys Cr) .... One Bid 11)  Oxycodone Hcl 5 Mg  Tabs (Oxycodone Hcl) .... Take 1 Tablet By Mouth Once A Day As Needed 12)  Fish Oil 1000 Mg Caps (Omega-3 Fatty Acids) .... Take 1 By Mouth Qd 13)  Reglan 10 Mg Tabs (Metoclopramide Hcl) .Marland Kitchen.. 1 By Mouth Four Times A Day As Needed For Nausea 14)  Cpap Machine .... At Bedtime  Allergies (verified): 1)  ! Guaifenesin-Codeine (Guaifenesin-Codeine)  Review of Systems       The patient  complains of sore throat, nasal congestion/difficulty breathing through nose, sneezing, and joint stiffness or pain.  The patient denies shortness of breath with activity, shortness of breath at rest, productive cough, non-productive cough, coughing up blood, chest pain, irregular heartbeats, acid heartburn, indigestion, loss of appetite, weight change, abdominal pain, difficulty swallowing, tooth/dental problems, headaches, itching, ear ache, anxiety, depression, hand/feet swelling, rash, change in color of mucus, and fever.    Vital Signs:  Patient profile:   55 year old female Height:      68 inches Weight:      237.25 pounds BMI:     36.20 O2 Sat:      97 % on Room air Temp:     98.4 degrees F oral Pulse rate:   85 / minute BP sitting:   110 / 78  (right arm) Cuff size:   regular  Vitals Entered By: Erica [name] (July 06, 2010 11:54 AM)  O2 Flow:  Room air CC: 6 month follow up. pt states uses cpap 7/7 night x 6-8 hrs a nights. Pt states her cpap has been giving her headaches. Pt states she has been having palpitations.  Comments meds and allregies updated Phone number updated Erica [name]  July 06, 2010 11:54 AM    Physical Exam  General:  obese female in nad  Nose:  no skin breakdown or pressure necrosis from cpap mask Extremities:  mild edema, no cyanosis Neurologic:  alert and oriented, moves all 4. does not appear sleepy   Impression & Recommendations:  Problem # 1:  OBSTRUCTIVE SLEEP APNEA (ICD-327.23) the pt is having multiple cpap tolerance issues that should be easily corrected.  Will have the dme work on some of these things with her.  The most important thing is for her to keep wearing the device and to work aggressively on weight loss.  Other Orders: Est. Patient Level III (09811) DME Referral (DME)  Patient Instructions: 1)  will have your mask fit looked at 2)  will have dme show you how to use heater on the humidifier. 3)  will have pressure  increased to 14cm. 4)  work on weight loss 5)  followup with me in 6mos, but call if continue to have tolerance issues.

## 2010-11-12 NOTE — Progress Notes (Signed)
Summary: Rx refill req  Phone Note Refill Request Message from:  Patient on May 27, 2010 11:43 AM  Refills Requested: Medication #1:  KLOR-CON M20 20 MEQ  TBCR one bid   Dosage confirmed as above?Dosage Confirmed   Supply Requested: 6 months  Method Requested: Electronic Initial call taken by: Margaret Pyle, [name],  May 27, 2010 11:43 AM    Prescriptions: KLOR-CON M20 20 MEQ  TBCR (POTASSIUM CHLORIDE CRYS CR) one bid  #[phone] x 1   Entered by:   Margaret Pyle, [name]   Authorized by:   [name]   Signed by:   Margaret Pyle, [name] on 05/27/2010   Method used:   Electronically to        [facility]  [address] [id number] ([facility]       [address]       [place], [place]  [address]       Ph: [phone] or [phone]       Fax: [phone]   RxID:   [id number]

## 2010-11-12 NOTE — Assessment & Plan Note (Signed)
Summary: 4 MTH FU  STC   RS'D PER PT/NWS   Vital Signs:  Patient profile:   55 year old female Height:      68 inches (172.72 cm) Weight:      241.8 pounds (109.91 kg) O2 Sat:      97 % on Room air Temp:     98.2 degrees F (36.78 degrees C) oral Pulse rate:   83 / minute BP sitting:   102 / 82  (left arm) Cuff size:   large  Vitals Entered By: Orlan Leavens (Feb 23, 2010 11:08 AM)  O2 Flow:  Room air CC: 4 month follow-up/ Pt req urine to be check ? infection Is Patient Diabetic? No Pain Assessment Patient in pain? no        Primary Care Provider:  [name]  CC:  4 month follow-up/ Pt req urine to be check ? infection.  History of Present Illness: here for 4 month followup -  1) OSA - still c/o fatigue - ongoing issues with mask fit reviewed - seeing pulm for same - family notes less snoring with CPAP -   2) HTN - reports compliance with ongoing medical treatment and no changes in medication dose or frequency. denies adverse side effects related to current therapy. trace edema, no CP or HA, no weakness or vison changes  3) dyslipidemia - reports compliance with ongoing medical treatment and no changes in medication dose or frequency. denies adverse side effects related to current therapy.  no GI or muscle c/o  4) fatigue - feels tired all the time - see OSA above - no weight gain or loss, no fever, no abd pain, no rash or joint swelling - still with constipation (no bowel changes) - denies depression symptoms active at this time  Clinical Review Panels:  Lipid Management   Cholesterol:  170 (01/23/2009)   LDL (bad choesterol):  111 (01/23/2009)   HDL (good cholesterol):  44.20 (01/23/2009)   Triglycerides:  51 (09/20/2006)  CBC   WBC:  5.6 (01/23/2009)   RBC:  4.17 (01/23/2009)   Hgb:  12.6 (01/23/2009)   Hct:  37.4 (01/23/2009)   Platelets:  218.0 (01/23/2009)   MCV  89.6 (01/23/2009)   MCHC  33.9 (01/23/2009)   RDW  13.4 (01/23/2009)   PMN:  68.3  (01/23/2009)   Lymphs:  23.0 (01/23/2009)   Monos:  7.4 (01/23/2009)   Eosinophils:  0.9 (01/23/2009)   Basophil:  0.4 (01/23/2009)  Complete Metabolic Panel   Glucose:  104 (01/23/2009)   Sodium:  142 (01/23/2009)   Potassium:  3.9 (01/23/2009)   Chloride:  105 (01/23/2009)   CO2:  31 (01/23/2009)   BUN:  15 (01/23/2009)   Creatinine:  0.7 (01/23/2009)   Albumin:  3.3 (01/23/2009)   Total Protein:  7.1 (01/23/2009)   Calcium:  9.1 (01/23/2009)   Total Bili:  0.7 (01/23/2009)   Alk Phos:  112 (01/23/2009)   SGPT (ALT):  14 (01/23/2009)   SGOT (AST):  19 (01/23/2009)   Current Medications (verified): 1)  Cyclobenzaprine Hcl 10 Mg Tabs (Cyclobenzaprine Hcl) .... Once Daily 2)  Simvastatin 40 Mg Tabs (Simvastatin) .... One By Mouth Daily 3)  Flonase 50 Mcg/act  Susp (Fluticasone Propionate) .... Spray in Each Nostril Two Times A Day 4)  Allegra 180 Mg  Tabs (Fexofenadine Hcl) .... Qd 5)  Celexa 40 Mg  Tabs (Citalopram Hydrobromide) .... One By Mouth Daily 6)  Xanax Xr 1 Mg  Tb24 (Alprazolam) .Marland KitchenMarland KitchenMarland Kitchen  2 By Mouth Daily 7)  Climara Pro 0.045-0.015 Mg/day  Ptwk (Estradiol-Levonorgestrel) .... Apply Weekly 8)  Lasix 40 Mg  Tabs (Furosemide) .... One in Am 9)  Lasix 20 Mg  Tabs (Furosemide) .... One Pm 10)  Klor-Con M20 20 Meq  Tbcr (Potassium Chloride Crys Cr) .... One Bid 11)  Oxycodone Hcl 5 Mg  Tabs (Oxycodone Hcl) .... Take 1 Tablet By Mouth Once A Day As Needed 12)  Fish Oil 1000 Mg Caps (Omega-3 Fatty Acids) .... Take 1 By Mouth Qd 13)  Miralax  Powd (Polyethylene Glycol 3350) .Marland Kitchen.. 17g By Mouth Two Times A Day in 8oz Drink 14)  Analpram-Hc 1-2.5 % Lotn (Hydrocortisone Ace-Pramoxine) .... Apply To Rectum Two Times A Day As Needed For Pain/hemrrhoids 15)  Lotrisone 1-0.05 % Crea (Clotrimazole-Betamethasone) .... Apply To Affected Areas/itch Two Times A Day X 7 Days, Then As Needed  Allergies (verified): 1)  ! Guaifenesin-Codeine (Guaifenesin-Codeine)  Past History:  Past Medical  History: hypertension PAF dyslipidemia panic attacks, depression hx OA allergic rhinitis obesity OSA - CPAP hs  MD rooster: pulm - [facility]      Review of Systems  The patient denies anorexia, vision loss, hoarseness, chest pain, syncope, headaches, muscle weakness, suspicious skin lesions, and difficulty walking.    Physical Exam  General:  overweight-appearing but alert, well-developed, well-nourished, and cooperative to examination.    Lungs:  normal respiratory effort, no intercostal retractions or use of accessory muscles; normal breath sounds bilaterally - no crackles and no wheezes.    Heart:  normal rate, regular rhythm, no murmur, and no rub. BLE without edema. Neurologic:  No cranial nerve deficits noted. Station and gait are normal. Plantar reflexes are down-going bilaterally. DTRs are symmetrical throughout. Sensory, motor and coordinative functions appear intact.   Impression & Recommendations:  Problem # 1:  OBSTRUCTIVE SLEEP APNEA (ICD-327.23) per pulm note 01/07/10: the pt has adapted fairly well to cpap, and feels that her sleep and daytime symptoms are better.  At this point, she will need to have her pressure optimized on her cpap device, and I have also encouraged her to work on weight loss.  continure same - to f/u if cont sleep troube or excess fatigue (assuming labs ok today)  Problem # 2:  ESSENTIAL HYPERTENSION, BENIGN (ICD-401.1)  Her updated medication list for this problem includes:    Lasix 40 Mg Tabs (Furosemide) ..... One in am    Lasix 20 Mg Tabs (Furosemide) ..... One pm  Orders: TLB-BMP (Basic Metabolic Panel-BMET) (80048-METABOL)  BP today: 102/82 Prior BP: 112/80 (01/07/2010)  Labs Reviewed: K+: 3.9 (01/23/2009) Creat: : 0.7 (01/23/2009)   Chol: 170 (01/23/2009)   HDL: 44.20 (01/23/2009)   LDL: 111 (01/23/2009)   TG: 75.0 (01/23/2009)  Problem # 3:  HYPERLIPIDEMIA (ICD-272.4) FLP and LFTs today refill done Her updated medication  list for this problem includes:    Simvastatin 40 Mg Tabs (Simvastatin) ..... One by mouth daily  Orders: TLB-Lipid Panel (80061-LIPID)  Labs Reviewed: SGOT: 19 (01/23/2009)   SGPT: 14 (01/23/2009)   HDL:44.20 (01/23/2009), 67.0 (12/29/2007)  LDL:111 (01/23/2009), 123 (12/29/2007)  Chol:170 (01/23/2009), 197 (12/29/2007)  Trig:75.0 (01/23/2009), 33 (12/29/2007)  Problem # 4:  FATIGUE (ICD-780.79) chronic symptoms - exam and hx benign  - screen labs now Orders: TLB-CBC Platelet - w/Differential (85025-CBCD) TLB-TSH (Thyroid Stimulating Hormone) (84443-TSH)  Problem # 5:  ALLERGIC RHINITIS (ICD-477.9)  Her updated medication list for this problem includes:    Flonase 50 Mcg/act Susp (Fluticasone propionate) .Marland KitchenMarland KitchenMarland KitchenMarland Kitchen  Spray in each nostril two times a day    Allegra 180 Mg Tabs (Fexofenadine hcl) ..... Qd  Discussed use of allergy medications and environmental measures.   Problem # 6:  DEPRESSION, MAJOR, MILD (ICD-296.21)  Orders: TLB-CBC Platelet - w/Differential (85025-CBCD) TLB-TSH (Thyroid Stimulating Hormone) (84443-TSH)  Complete Medication List: 1)  Cyclobenzaprine Hcl 10 Mg Tabs (Cyclobenzaprine hcl) .... Once daily 2)  Simvastatin 40 Mg Tabs (Simvastatin) .... One by mouth daily 3)  Flonase 50 Mcg/act Susp (Fluticasone propionate) .... Spray in each nostril two times a day 4)  Allegra 180 Mg Tabs (Fexofenadine hcl) .... Qd 5)  Celexa 40 Mg Tabs (Citalopram hydrobromide) .... One by mouth daily 6)  Xanax Xr 1 Mg Tb24 (Alprazolam) .... 2 by mouth daily 7)  Climara Pro 0.045-0.015 Mg/day Ptwk (Estradiol-levonorgestrel) .... Apply weekly 8)  Lasix 40 Mg Tabs (Furosemide) .... One in am 9)  Lasix 20 Mg Tabs (Furosemide) .... One pm 10)  Klor-con M20 20 Meq Tbcr (Potassium chloride crys cr) .... One bid 11)  Oxycodone Hcl 5 Mg Tabs (Oxycodone hcl) .... Take 1 tablet by mouth once a day as needed 12)  Fish Oil 1000 Mg Caps (Omega-3 fatty acids) .... Take 1 by mouth qd 13)   Miralax Powd (Polyethylene glycol 3350) .Marland Kitchen.. 17g by mouth two times a day in 8oz drink 14)  Analpram-hc 1-2.5 % Lotn (Hydrocortisone ace-pramoxine) .... Apply to rectum two times a day as needed for pain/hemrrhoids 15)  Lotrisone 1-0.05 % Crea (Clotrimazole-betamethasone) .... Apply to affected areas/itch two times a day x 7 days, then as needed  Other Orders: UA Dipstick w/o Micro (manual) (98119) TLB-Hepatic/Liver Function Pnl (80076-HEPATIC)  Patient Instructions: 1)  it was good to see you today. 2)  no evidence for infection in your urine - 3)  test(s) ordered today - your results will be posted on the phone tree for review in 48-72 hours from the time of test completion; call [phone] and enter your 9 digit MRN (listed above on this page, just below your name); if any changes need to be made or there are abnormal results, you will be contacted directly.  4)  continue medications as discussed - check at [facility] for generic over-the-counter allergy medications (loratadine - 30d $4, 90d $10) 5)  Please schedule a follow-up appointment in 4 months to monitor blood pressure and other issues, call sooner if problems.  6)  Call Dr. Teddy Spike office if still having problems with sleep Prescriptions: SIMVASTATIN 40 MG TABS (SIMVASTATIN) one by mouth daily  #90 x 3   Entered and Authorized by:   [name]   Signed by:   [name] on 02/23/2010   Method used:   Print then Give to Patient   RxID:   [id number]   Laboratory Results   Urine Tests    Routine Urinalysis   Color: colorless Appearance: Clear Glucose: negative   (Normal Range: Negative) Bilirubin: negative   (Normal Range: Negative) Ketone: negative   (Normal Range: Negative) Spec. Gravity: <1.005   (Normal Range: 1.003-1.035) Blood: negative   (Normal Range: Negative) pH: 5.0   (Normal Range: 5.0-8.0) Protein: negative   (Normal Range: Negative) Urobilinogen: 0.2   (Normal Range:  0-1) Nitrite: negative   (Normal Range: Negative) Leukocyte Esterace: negative   (Normal Range: Negative)

## 2010-11-12 NOTE — Progress Notes (Signed)
Summary: C PAP SETTING  Phone Note Call from Patient   Caller: Patient Call For: [facility] Summary of Call: need to talk to nurse about c pap setting . ? as to whether C PAP IS CAUSING HEADACHES Initial call taken by: Rickard Patience,  April 14, 2010 11:37 AM  Follow-up for Phone Call        has been having HA's "off and on" and states also has some sinus issues w/ pressure, congestion and PND.  pt wonders if her pressure may be too high at 4.  will take CPAP to [facility] tomorrow for download.  please advise, thanks! Boone Master CNA/MA  April 14, 2010 12:51 PM   Additional Follow-up for Phone Call Additional follow up Details #1::        I ordered a download on auto for 2 weeks back in march, and I do not see where i ever got the results to optimize her pressure.  Need this to optimize her presssure. let her know that if she is having sinus congestion issues this is usually due to dry air from cpap machine..Need to increase heat on humifidifier to see if will help...this is what the "4" is, not the pressure.  Need to turn higher to see if more moisture will help. In terms of headache in cpap pts, this is typically due to overtightening of the straps to the mask..Need to try and loosen, then if leaks to much, may not be right mask fit. Additional Follow-up by: Barbaraann Share MD,  April 15, 2010 5:21 PM    Additional Follow-up for Phone Call Additional follow up Details #2::    ATC pt.  NA, no option to leave message. Boone Master CNA/MA  April 15, 2010 5:30 PM   Called, spoke with pt.  Pt informed of above statement and recs per KC.  She verbalized understanding.  Gweneth Dimitri RN  April 16, 2010 3:27 PM

## 2010-11-12 NOTE — Assessment & Plan Note (Signed)
Summary: ONE MONTH FOLLOW UP-LB   Vital Signs:  Patient profile:   55 year old female Height:      66 inches (167.64 cm) Weight:      235.4 pounds (107 kg) O2 Sat:      96 % on Room air Temp:     98.0 degrees F (36.67 degrees C) oral Pulse rate:   88 / minute BP sitting:   112 / 78  (left arm) Cuff size:   large  Vitals Entered By: Orlan Leavens (October 17, 2009 10:43 AM)  O2 Flow:  Room air CC: 1 month follow-up Is Patient Diabetic? No Pain Assessment Patient in pain? no        Primary Care Provider:  [name]  CC:  1 month follow-up.  History of Present Illness: seen last mo for allergy symptoms by partner dr. Yetta Barre - given depomedrol with no relief of symptoms - caused palpitations taking nasal steroids and allegra w/o complete relief a/w sinus pressure and snoring - worried about ?sleep apnea  c/o constipation - says fiber and softeners don't work -  has not tried [employer] research [employer], [employer] - a/w painful and occ bleeding hemmrrhoids wold like to be checked for hemmrrhoids now  c/o "heel spur pain" on right side - talked to ortho ([place]) who referred her here denies injury or swelling or bruising - symptoms present regardless of type of shoes worn  lastly, feels thirsty and wants to be checked for DM   Current Medications (verified): 1)  Cyclobenzaprine Hcl 10 Mg Tabs (Cyclobenzaprine Hcl) .... Once Daily 2)  Simvastatin 40 Mg Tabs (Simvastatin) .... One By Mouth Daily 3)  Flonase 50 Mcg/act  Susp (Fluticasone Propionate) .... Spray in Each Nostril Two Times A Day 4)  Allegra 180 Mg  Tabs (Fexofenadine Hcl) .... Qd 5)  Celexa 40 Mg  Tabs (Citalopram Hydrobromide) .... One By Mouth Daily 6)  Xanax Xr 1 Mg  Tb24 (Alprazolam) .... 2 By Mouth Daily 7)  Climara Pro 0.045-0.015 Mg/day  Ptwk (Estradiol-Levonorgestrel) .... Apply Weekly 8)  Lasix 40 Mg  Tabs (Furosemide) .... One in Am 9)  Lasix 20 Mg  Tabs (Furosemide) .... One Pm 10)  Klor-Con M20 20 Meq  Tbcr  (Potassium Chloride Crys Cr) .... One Bid 11)  Oxycodone Hcl 5 Mg  Tabs (Oxycodone Hcl) .... One Once Daily 12)  Fish Oil 1000 Mg Caps (Omega-3 Fatty Acids) .... Take 1 By Mouth Qd  Allergies (verified): 1)  ! Guaifenesin-Codeine (Guaifenesin-Codeine)  Past History:  Past Medical History: hypertension PAF high cholesterol panic attacks OA allergic rhinitis obesity  Past Surgical History: right total knee  hysterectomy  right oophorectomy.  No cancer childbirth x 3  Family History: dad - 39, dementia s/p THR s/p fx 2010 - not doing well mom - "mass in spine"  Review of Systems       The patient complains of hematochezia.  The patient denies fever, prolonged cough, headaches, abdominal pain, and melena.         also c/o emotional stress with illness and declining health of parents  Physical Exam  General:  overweight-appearing but alert, well-developed, well-nourished, and cooperative to examination.    Eyes:  vision grossly intact; pupils equal, round and reactive to light.  conjunctiva and lids normal.    Ears:  R ear normal and L ear normal.   Nose:  no intranasal foreign body, no nasal polyps, no nasal mucosal lesions, no mucosal friability, no active  bleeding or clots, no sinus percussion tenderness, no septum abnormalities, nasal dischargemucosal pallor, and mucosal edema.   Mouth:  Oral mucosa and oropharynx without lesions or exudates.  Teeth in good repair. Lungs:  normal respiratory effort, no intercostal retractions or use of accessory muscles; normal breath sounds bilaterally - no crackles and no wheezes.    Heart:  normal rate, regular rhythm, no murmur, and no rub. BLE without edema. Rectal:  normal sphincter tone, no masses, and external hemorrhoid(s).     Impression & Recommendations:  Problem # 1:  ALLERGIC RHINITIS (ICD-477.9)  refer to pulm for allergy testing -  ?if "snoring" related to sinus and allergy symptoms vs OSA -   Her updated medication  list for this problem includes:    Flonase 50 Mcg/act Susp (Fluticasone propionate) ..... Spray in each nostril two times a day    Allegra 180 Mg Tabs (Fexofenadine hcl) ..... Qd  Orders: Pulmonary Referral (Pulmonary)  Problem # 2:  MORBID OBESITY (ICD-278.01)  eval by pulm for poss OSA -  referral made today Ht: 66 (10/17/2009)   Wt: 235.4 (10/17/2009)   BMI: 38.72 (09/15/2009)  Orders: Pulmonary Referral (Pulmonary)  Problem # 3:  CONSTIPATION, CHRONIC (ICD-564.09)  rec Miralax two times a day  Discussed dietary fiber measures and increased water intake.   Her updated medication list for this problem includes:    Miralax Powd (Polyethylene glycol 3350) .Marland KitchenMarland KitchenMarland KitchenMarland Kitchen 17g by mouth two times a day in 8oz drink  Problem # 4:  HEMORRHOIDS, EXTERNAL (ICD-455.3) add analpram and treatment for constipation as above - Orders: Prescription Created Electronically [phone])  Problem # 5:  SCREENING, DIABETES MELLITUS (ICD-V77.1)  random cbg 100 - no evidence for dm at present but life style places at risk -  advised on need for weight loss  Orders: Glucose, (CBG) (60454)  Problem # 6:  ESSENTIAL HYPERTENSION, BENIGN (ICD-401.1)  Her updated medication list for this problem includes:    Lasix 40 Mg Tabs (Furosemide) ..... One in am    Lasix 20 Mg Tabs (Furosemide) ..... One pm  BP today: 112/78 Prior BP: 134/90 (09/15/2009)  Labs Reviewed: K+: 3.9 (01/23/2009) Creat: : 0.7 (01/23/2009)   Chol: 170 (01/23/2009)   HDL: 44.20 (01/23/2009)   LDL: 111 (01/23/2009)   TG: 75.0 (01/23/2009)  Complete Medication List: 1)  Cyclobenzaprine Hcl 10 Mg Tabs (Cyclobenzaprine hcl) .... Once daily 2)  Simvastatin 40 Mg Tabs (Simvastatin) .... One by mouth daily 3)  Flonase 50 Mcg/act Susp (Fluticasone propionate) .... Spray in each nostril two times a day 4)  Allegra 180 Mg Tabs (Fexofenadine hcl) .... Qd 5)  Celexa 40 Mg Tabs (Citalopram hydrobromide) .... One by mouth daily 6)  Xanax Xr 1 Mg  Tb24 (Alprazolam) .... 2 by mouth daily 7)  Climara Pro 0.045-0.015 Mg/day Ptwk (Estradiol-levonorgestrel) .... Apply weekly 8)  Lasix 40 Mg Tabs (Furosemide) .... One in am 9)  Lasix 20 Mg Tabs (Furosemide) .... One pm 10)  Klor-con M20 20 Meq Tbcr (Potassium chloride crys cr) .... One bid 11)  Oxycodone Hcl 5 Mg Tabs (Oxycodone hcl) .... One once daily 12)  Fish Oil 1000 Mg Caps (Omega-3 fatty acids) .... Take 1 by mouth qd 13)  Miralax Powd (Polyethylene glycol 3350) .Marland Kitchen.. 17g by mouth two times a day in 8oz drink 14)  Analpram-hc 1-2.5 % Lotn (Hydrocortisone ace-pramoxine) .... Apply to rectum two times a day as needed for pain/hemrrhoids  Patient Instructions: 1)  it was good to see you  today.  2)  we'll make referral to [facility] for evaluation of possible sleep apnea and allergy symptoms. Our office will contact you regarding this appointment once made.  3)  Add Miralax two times a day to your medications to help with constipation and hemmrhoids - also hemrrhoid cream  -your prescriptions have been electronically submitted to your pharmacy. Please take as directed. Contact our office if you believe you're having problems with the medication(s).  4)  wear a heel cup on both feet for protection from the spur pain - can pick up at your pharmacy (over the counter) -  5)  Please schedule a follow-up appointment in 4 months, sooner if problems.  Prescriptions: ANALPRAM-HC 1-2.5 % LOTN (HYDROCORTISONE ACE-PRAMOXINE) apply to rectum two times a day as needed for pain/hemrrhoids  #1 x 0   Entered and Authorized by:   [name]   Signed by:   [name] on 10/17/2009   Method used:   Electronically to        [facility]  [address] [id number] (retail)       [address]       [place], [place]  [address]       Ph: [phone] or [phone]       Fax: [phone]   RxID:   [id number] MIRALAX  POWD (POLYETHYLENE GLYCOL 3350) 17g by mouth two times  a day in 8oz drink  #100 x 1   Entered and Authorized by:   [name]   Signed by:   [name] on 10/17/2009   Method used:   Electronically to        [facility]  [address] [id number] (ret[facility])       [address]       [place], [place]  [address]       Ph: [phone] or [phone]       Fax: [phone]   RxID:   [id number]   Laboratory Results   Blood Tests     CBG Fasting:: 89mg /dL

## 2010-11-12 NOTE — Assessment & Plan Note (Signed)
Summary: FEVER?/ EAR PROBLEM/ CONGESTION/NWS   Vital Signs:  Patient profile:   55 year old female Height:      66 inches Weight:      239 pounds BMI:     38.72 O2 Sat:      97 % on Room air Temp:     98.3 degrees F oral Pulse rate:   89 / minute Pulse rhythm:   regular Resp:     16 per minute BP sitting:   134 / 90  (right arm)  Nutrition Counseling: Patient's BMI is greater than 25 and therefore counseled on weight management options.  O2 Flow:  Room air CC: Pt c/o congestion, feeling hot,  and hearing issues./kb   Primary Care Provider:  [name]  CC:  Pt c/o congestion, feeling hot, and and hearing issues./kb.  History of Present Illness: New to me c/o nasal congestion and pressure/popping in her ears.  Preventive Screening-Counseling & Management  Alcohol-Tobacco     Smoking Status: never  Current Medications (verified): 1)  Cyclobenzaprine Hcl 10 Mg Tabs (Cyclobenzaprine Hcl) .... Once Daily 2)  Simvastatin 40 Mg Tabs (Simvastatin) .... One By Mouth Daily 3)  Flonase 50 Mcg/act  Susp (Fluticasone Propionate) .... Spray in Each Nostril Two Times A Day 4)  Allegra 180 Mg  Tabs (Fexofenadine Hcl) .... Qd 5)  Celexa 40 Mg  Tabs (Citalopram Hydrobromide) .... One By Mouth Daily 6)  Xanax Xr 1 Mg  Tb24 (Alprazolam) .... 2 By Mouth Daily 7)  Climara Pro 0.045-0.015 Mg/day  Ptwk (Estradiol-Levonorgestrel) .... Apply Weekly 8)  Lasix 40 Mg  Tabs (Furosemide) .... One in Am 9)  Lasix 20 Mg  Tabs (Furosemide) .... One Pm 10)  Klor-Con M20 20 Meq  Tbcr (Potassium Chloride Crys Cr) .... One Bid 11)  Oxycodone Hcl 5 Mg  Tabs (Oxycodone Hcl) .... One Once Daily 12)  Fish Oil 1000 Mg Caps (Omega-3 Fatty Acids) .... Take 1 By Mouth Qd  Allergies (verified): 1)  ! Guaifenesin-Codeine (Guaifenesin-Codeine)  Past History:  Past Medical History: Reviewed history from 11/27/2007 and no changes required. hypertension PAF high cholesterol panic attacks right  total knee hysterectomy with right oophorectomy.  No cancer childbirth x 3  Social History: Reviewed history from 11/27/2007 and no changes required. Married Never Smoked Alcohol use-no Drug use-no Regular exercise-no  Review of Systems  The patient denies anorexia, fever, weight loss, chest pain, syncope, dyspnea on exertion, peripheral edema, prolonged cough, headaches, hemoptysis, abdominal pain, suspicious skin lesions, enlarged lymph nodes, and angioedema.   ENT:  Complains of decreased hearing, nasal congestion, and postnasal drainage; denies difficulty swallowing, ear discharge, earache, nosebleeds, ringing in ears, sinus pressure, and sore throat.  Physical Exam  General:  overweight-appearing but alert, well-developed, well-nourished, and cooperative to examination.    Head:  Normocephalic and atraumatic without obvious abnormalities. No apparent alopecia or balding. Ears:  R ear normal and L ear normal.   Nose:  no intranasal foreign body, no nasal polyps, no nasal mucosal lesions, no mucosal friability, no active bleeding or clots, no sinus percussion tenderness, no septum abnormalities, nasal dischargemucosal pallor, and mucosal edema.   Mouth:  Oral mucosa and oropharynx without lesions or exudates.  Teeth in good repair. Neck:  supple, full ROM, no masses, no thyromegaly, no thyroid nodules or tenderness, no JVD, and no cervical lymphadenopathy.   Lungs:  normal respiratory effort, no intercostal retractions, no accessory muscle use, normal breath sounds, no dullness, no crackles, and  no wheezes.   Heart:  normal rate, regular rhythm, no murmur, no gallop, no rub, and no JVD.   Abdomen:  soft, non-tender, normal bowel sounds, no distention, no masses, no guarding, no rigidity, no hepatomegaly, and no splenomegaly.   Msk:  normal ROM, no joint tenderness, no joint swelling, no joint warmth, no redness over joints, no joint deformities, no joint instability, and no crepitation.    Pulses:  R and L carotid,radial,femoral,dorsalis pedis and posterior tibial pulses are full and equal bilaterally Extremities:  No clubbing, cyanosis, edema, or deformity noted with normal full range of motion of all joints.   Neurologic:  No cranial nerve deficits noted. Station and gait are normal. Plantar reflexes are down-going bilaterally. DTRs are symmetrical throughout. Sensory, motor and coordinative functions appear intact. Skin:  turgor normal, color normal, no rashes, no suspicious lesions, no ecchymoses, no petechiae, no purpura, and no ulcerations.   Cervical Nodes:  no anterior cervical adenopathy and no posterior cervical adenopathy.   Psych:  Cognition and judgment appear intact. Alert and cooperative with normal attention span and concentration. No apparent delusions, illusions, hallucinations   Impression & Recommendations:  Problem # 1:  ALLERGIC RHINITIS (ICD-477.9) Assessment Deteriorated  try depo medrol Her updated medication list for this problem includes:    Flonase 50 Mcg/act Susp (Fluticasone propionate) ..... Spray in each nostril two times a day    Allegra 180 Mg Tabs (Fexofenadine hcl) ..... Qd  Orders: Depo- Medrol 40mg  (J1030) Depo- Medrol 80mg  (J1040)  Problem # 2:  ESSENTIAL HYPERTENSION, BENIGN (ICD-401.1) Assessment: Unchanged  Her updated medication list for this problem includes:    Lasix 40 Mg Tabs (Furosemide) ..... One in am    Lasix 20 Mg Tabs (Furosemide) ..... One pm  BP today: 134/90 Prior BP: 118/84 (03/11/2009)  Labs Reviewed: K+: 3.9 (01/23/2009) Creat: : 0.7 (01/23/2009)   Chol: 170 (01/23/2009)   HDL: 44.20 (01/23/2009)   LDL: 111 (01/23/2009)   TG: 75.0 (01/23/2009)  Complete Medication List: 1)  Cyclobenzaprine Hcl 10 Mg Tabs (Cyclobenzaprine hcl) .... Once daily 2)  Simvastatin 40 Mg Tabs (Simvastatin) .... One by mouth daily 3)  Flonase 50 Mcg/act Susp (Fluticasone propionate) .... Spray in each nostril two times a day  4)  Allegra 180 Mg Tabs (Fexofenadine hcl) .... Qd 5)  Celexa 40 Mg Tabs (Citalopram hydrobromide) .... One by mouth daily 6)  Xanax Xr 1 Mg Tb24 (Alprazolam) .... 2 by mouth daily 7)  Climara Pro 0.045-0.015 Mg/day Ptwk (Estradiol-levonorgestrel) .... Apply weekly 8)  Lasix 40 Mg Tabs (Furosemide) .... One in am 9)  Lasix 20 Mg Tabs (Furosemide) .... One pm 10)  Klor-con M20 20 Meq Tbcr (Potassium chloride crys cr) .... One bid 11)  Oxycodone Hcl 5 Mg Tabs (Oxycodone hcl) .... One once daily 12)  Fish Oil 1000 Mg Caps (Omega-3 fatty acids) .... Take 1 by mouth qd  Patient Instructions: 1)  Please schedule a follow-up appointment in 1 month. 2)  It is important that you exercise regularly at least 20 minutes 5 times a week. If you develop chest pain, have severe difficulty breathing, or feel very tired , stop exercising immediately and seek medical attention. 3)  You need to lose weight. Consider a lower calorie diet and regular exercise.  4)  Check your Blood Pressure regularly. If it is above 140/90: you should make an appointment.   Medication Administration  Injection # 1:    Medication: Depo- Medrol 80mg     Route:  IM    Site: R deltoid    Exp Date: [id number]    Lot #: [id number]    Mfr: [email or web address]    Patient tolerated injection without complications    Given by: Rock Nephew [name] (September 15, 2009 3:44 PM)  Injection # 2:    Medication: Depo- Medrol 40mg     Route: IM    Site: R deltoid    Lot #: [id number]    Mfr: [email or web address]    Patient tolerated injection without complications    Given by: Rock Nephew [name] (September 15, 2009 3:44 PM)  Orders Added: 1)  Est. Patient Level IV [21308] 2)  Depo- Medrol 40mg  [J1030] 3)  Depo- Medrol 80mg  [J1040]

## 2010-11-12 NOTE — Assessment & Plan Note (Signed)
Summary: CONSTIPATION/ BLOATING/[facility]   Vital Signs:  Patient profile:   55 year old female Height:      68 inches (172.72 cm) Weight:      234.0 pounds (106.36 kg) O2 Sat:      97 % on Room air Temp:     98.1 degrees F (36.72 degrees C) oral Pulse rate:   77 / minute BP sitting:   100 / 82  (left arm) Cuff size:   large  Vitals Entered By: Orlan Leavens RMA (June 08, 2010 4:00 PM)  O2 Flow:  Room air CC: constipation Is Patient Diabetic? No Pain Assessment Patient in pain? no        Primary Care Provider:  [name]  CC:  constipation.  History of Present Illness: c/o constipation - onset 2 weeks ago - but +hx same a/w abd bloating but no pain in abd or rectum - very small hard bowel "pellets" and gas, no melena or diarrhea - reports no improvement symptoms with milk of mag or sennakot plus - no n/v - no fever denies use of narcotics or change in meds  also reviewed prior chronic issues: 1) OSA - still c/o fatigue - ongoing issues with mask fit reviewed - seeing pulm for same - family notes less snoring with CPAP -   2) HTN - reports compliance with ongoing medical treatment and no changes in medication dose or frequency. denies adverse side effects related to current therapy. trace edema, no CP or HA, no weakness or vison changes  3) dyslipidemia - reports compliance with ongoing medical treatment and no changes in medication dose or frequency. denies adverse side effects related to current therapy.  no GI or muscle c/o  4) fatigue - feels tired all the time - see OSA above - no weight gain or loss, no fever, no abd pain, no rash or joint swelling - still with constipation (no bowel changes) - denies depression symptoms active at this time  Clinical Review Panels:  Prevention   Last Mammogram:  ASSESSMENT: Negative - BI-RADS 1^MM DIGITAL SCREENING (02/23/2010)  CBC   WBC:  6.9 (02/23/2010)   RBC:  4.35 (02/23/2010)   Hgb:  12.7 (02/23/2010)   Hct:   37.8 (02/23/2010)   Platelets:  237.0 (02/23/2010)   MCV  87.1 (02/23/2010)   MCHC  33.6 (02/23/2010)   RDW  13.8 (02/23/2010)   PMN:  64.1 (02/23/2010)   Lymphs:  25.3 (02/23/2010)   Monos:  8.8 (02/23/2010)   Eosinophils:  1.4 (02/23/2010)   Basophil:  0.4 (02/23/2010)  Complete Metabolic Panel   Glucose:  95 (02/23/2010)   Sodium:  143 (02/23/2010)   Potassium:  4.1 (02/23/2010)   Chloride:  102 (02/23/2010)   CO2:  33 (02/23/2010)   BUN:  17 (02/23/2010)   Creatinine:  0.7 (02/23/2010)   Albumin:  3.8 (02/23/2010)   Total Protein:  7.4 (02/23/2010)   Calcium:  9.4 (02/23/2010)   Total Bili:  0.4 (02/23/2010)   Alk Phos:  146 (02/23/2010)   SGPT (ALT):  13 (02/23/2010)   SGOT (AST):  16 (02/23/2010)   Current Medications (verified): 1)  Cyclobenzaprine Hcl 10 Mg Tabs (Cyclobenzaprine Hcl) .... Once Daily 2)  Simvastatin 40 Mg Tabs (Simvastatin) .... One By Mouth Daily 3)  Flonase 50 Mcg/act  Susp (Fluticasone Propionate) .... Spray in Each Nostril Two Times A Day 4)  Allegra 180 Mg  Tabs (Fexofenadine Hcl) .... Qd 5)  Celexa 40 Mg  Tabs (Citalopram  Hydrobromide) .... One By Mouth Daily 6)  Xanax Xr 1 Mg  Tb24 (Alprazolam) .... 2 By Mouth Daily 7)  Climara Pro 0.045-0.015 Mg/day  Ptwk (Estradiol-Levonorgestrel) .... Apply Weekly 8)  Lasix 40 Mg  Tabs (Furosemide) .... One in Am 9)  Lasix 20 Mg  Tabs (Furosemide) .... One Pm 10)  Klor-Con M20 20 Meq  Tbcr (Potassium Chloride Crys Cr) .... One Bid 11)  Oxycodone Hcl 5 Mg  Tabs (Oxycodone Hcl) .... Take 1 Tablet By Mouth Once A Day As Needed 12)  Fish Oil 1000 Mg Caps (Omega-3 Fatty Acids) .... Take 1 By Mouth Qd  Allergies (verified): 1)  ! Guaifenesin-Codeine (Guaifenesin-Codeine)  Past History:  Past Medical History: hypertension PAF dyslipidemia panic attacks, depression hx OA allergic rhinitis obesity  OSA - CPAP hs  MD roster: pulm - [facility]      Review of Systems  The patient denies weight gain,  chest pain, peripheral edema, abdominal pain, melena, hematochezia, and severe indigestion/heartburn.    Physical Exam  General:  overweight-appearing but alert, well-developed, well-nourished, and cooperative to examination.   uncomfortable bt nontoxic Lungs:  normal respiratory effort, no intercostal retractions or use of accessory muscles; normal breath sounds bilaterally - no crackles and no wheezes.    Heart:  normal rate, regular rhythm, no murmur, and no rub. BLE without edema. Abdomen:  soft, non-tender, present but reduced bowel sounds, mild distention, no masses, no guarding, no rigidity, no hepatomegaly, and no splenomegaly.   Psych:  Cognition and judgment appear intact. Alert and cooperative with normal attention span and concentration. No apparent delusions, illusions, hallucinations   Impression & Recommendations:  Problem # 1:  CONSTIPATION, CHRONIC (ICD-564.09)  acute on chronic symptoms - ?gastroparesis or other underluing functional abn - denies use of narcotics or chronic laxatives - change miralax to mag citrate and scheduled lactulose - also use reglan - erx done also encouraged use of home enema - afeb and nontoxic but advised to seek ER help if n/v or pain develops also refer to GI for eval of same - ?need colo (unclear hx as pt reports this done remotely at a private residence?) The following medications were removed from the medication list:    Miralax Powd (Polyethylene glycol 3350) .Marland KitchenMarland KitchenMarland KitchenMarland Kitchen 17g by mouth two times a day in 8oz drink Her updated medication list for this problem includes:    Magnesium Citrate 1.745 Gm/84ml Soln (Magnesium citrate) .Marland Kitchen... Drink bottle until bowels move    Lactulose 10 Gm/30ml Soln (Lactulose) .Marland KitchenMarland KitchenMarland KitchenMarland Kitchen 15cc by mouth three times a day as needed for constipation  Orders: Gastroenterology Referral (GI) Prescription Created Electronically [phone])  Complete Medication List: 1)  Cyclobenzaprine Hcl 10 Mg Tabs (Cyclobenzaprine hcl) .... Once  daily 2)  Simvastatin 40 Mg Tabs (Simvastatin) .... One by mouth daily 3)  Flonase 50 Mcg/act Susp (Fluticasone propionate) .... Spray in each nostril two times a day 4)  Allegra 180 Mg Tabs (Fexofenadine hcl) .... Qd 5)  Celexa 40 Mg Tabs (Citalopram hydrobromide) .... One by mouth daily 6)  Xanax Xr 1 Mg Tb24 (Alprazolam) .... 2 by mouth daily 7)  Climara Pro 0.045-0.015 Mg/day Ptwk (Estradiol-levonorgestrel) .... Apply weekly 8)  Lasix 40 Mg Tabs (Furosemide) .... One in am 9)  Lasix 20 Mg Tabs (Furosemide) .... One pm 10)  Klor-con M20 20 Meq Tbcr (Potassium chloride crys cr) .... One bid 11)  Oxycodone Hcl 5 Mg Tabs (Oxycodone hcl) .... Take 1 tablet by mouth once a day as needed 12)  Fish Oil 1000 Mg Caps (Omega-3 fatty acids) .... Take 1 by mouth qd 13)  Reglan 10 Mg Tabs (Metoclopramide hcl) .Marland Kitchen.. 1 by mouth four times a day as needed for nausea 14)  Magnesium Citrate 1.745 Gm/72ml Soln (Magnesium citrate) .... Drink bottle until bowels move 15)  Lactulose 10 Gm/19ml Soln (Lactulose) .Marland Kitchen.. 15cc by mouth three times a day as needed for constipation  Patient Instructions: 1)  it was good to see you today. 2)  will work on bowels from above and below as discussed - reglan pills, mag citrate + lactulose to drink and fleets enemas at home - your prescriptions have been electronically submitted to your pharmacy. Please take as directed. Contact our office if you believe you're having problems with the medication(s).  3)  if your symptoms continue to worsen (stomach pain, any fever, etc), or if you are unable take anything by mouth (pills, fluids, etc), you should go to the emergency room for further evaluation and treatment.  4)  we'll make referral to GI for evaluation and possible colonoscopy. Our office will contact you regarding this appointment once made.  Prescriptions: LACTULOSE 10 GM/15ML SOLN (LACTULOSE) 15cc by mouth three times a day as needed for constipation  #1 liter x 0    Entered and Authorized by:   [name]   Signed by:   [name] on 06/08/2010   Method used:   Print then Give to Patient   RxID:   [id number] REGLAN 10 MG TABS (METOCLOPRAMIDE HCL) 1 by mouth four times a day as needed for nausea  #60 x 1   Entered and Authorized by:   [name]   Signed by:   [name] on 06/08/2010   Method used:   Print then Give to Patient   RxID:   [id number] ALLEGRA 180 MG  TABS (FEXOFENADINE HCL) QD  #100 x 4   Entered and Authorized by:   [name]   Signed by:   [name] on 06/08/2010   Method used:   Print then Give to Patient   RxID:   [id number] LACTULOSE 10 GM/15ML SOLN (LACTULOSE) 15cc by mouth three times a day as needed for constipation  #1 liter x 0   Entered and Authorized by:   [name]   Signed by:   [name] on 06/08/2010   Method used:   Electronically to        [facility]  [address] [id number] (retail)       [address]       [place], [place]  [address]       Ph: [phone] or [phone]       Fax: [phone]   RxID:   [id number] REGLAN 10 MG TABS (METOCLOPRAMIDE HCL) 1 by mouth four times a day as needed for nausea  #60 x 1   Entered and Authorized by:   [name]   Signed by:   [name] on 06/08/2010   Method used:   Electronically to        [facility]  [address] [id number] (ret[facility])       [address]       [place], [place]  [address]       Ph: [phone] or [phone]       Fax:  [phone]   RxID:   [id number]

## 2010-11-12 NOTE — Assessment & Plan Note (Signed)
Summary: NEW/ TRANSFER FROM DR TODD/ [facility]ch   Vital Signs:  Patient profile:   55 year old female Height:      66 inches (167.64 cm) Weight:      237.4 pounds (107.91 kg) O2 Sat:      97 % Temp:     98.6 degrees F (37.00 degrees C) oral Pulse rate:   82 / minute BP sitting:   118 / 84  (left arm) Cuff size:   large  Vitals Entered By: Orlan Leavens (March 11, 2009 1:22 PM) CC: New patient est/ also pt have rash on her face, arm and back x's 3 days itches Is Patient Diabetic? No Pain Assessment Patient in pain? no        Primary Care Provider:  [name]  CC:  New patient est/ also pt have rash on her face and arm and back x's 3 days itches.  History of Present Illness: complains of itching on bilateral arms anterior chest and face.  Symptoms started Friday (4 days ago) .  No history of previous allergic reaction.  no new medication changes.  Positive exposure to poison ivy 72 hours before onset of rash.  using hydrocortisone over-the-counter cream with some relief and taking Allegra daily for her other seasonal allergy symptoms  Current Medications (verified): 1)  Cyclobenzaprine Hcl 10 Mg Tabs (Cyclobenzaprine Hcl) .... Once Daily 2)  Simvastatin 40 Mg Tabs (Simvastatin) .... One By Mouth Daily 3)  Flonase 50 Mcg/act  Susp (Fluticasone Propionate) .... Spray in Each Nostril Two Times A Day 4)  Allegra 180 Mg  Tabs (Fexofenadine Hcl) .... Qd 5)  Celexa 40 Mg  Tabs (Citalopram Hydrobromide) .... One By Mouth Daily 6)  Xanax Xr 1 Mg  Tb24 (Alprazolam) .... 2 By Mouth Daily 7)  Climara Pro 0.045-0.015 Mg/day  Ptwk (Estradiol-Levonorgestrel) .... Apply Weekly 8)  Lasix 40 Mg  Tabs (Furosemide) .... One in Am 9)  Lasix 20 Mg  Tabs (Furosemide) .... One Pm 10)  Klor-Con M20 20 Meq  Tbcr (Potassium Chloride Crys Cr) .... One Bid 11)  Oxycodone Hcl 5 Mg  Tabs (Oxycodone Hcl) .... One Once Daily 12)  Fish Oil 1000 Mg Caps (Omega-3 Fatty Acids) .... Take 1 By Mouth Qd   Allergies (verified): 1)  ! Guaifenesin-Codeine (Guaifenesin-Codeine)  Comments:  Nurse/Medical Assistant: The patient's medications and allergies were reviewed with the patient and were updated in the Medication and Allergy Lists. Valentina Gu Brand (March 11, 2009 1:24 PM) PMH reviewed for relevance  Review of Systems      See HPI General:  Denies chills, fatigue, and fever. Eyes:  Denies eye irritation and eye pain. CV:  Denies difficulty breathing at night. Resp:  Denies cough. Derm:  Complains of dryness, itching, and rash.  Physical Exam  General:  overweight-appearing but alert, well-developed, well-nourished, and cooperative to examination.    Lungs:  normal respiratory effort, no intercostal retractions or use of accessory muscles; normal breath sounds bilaterally - no crackles and no wheezes.    Heart:  normal rate, regular rhythm, no murmur, and no rub. BLE without edema. normal DP pulses and normal cap refill in all 4 extremities    Skin:  hives with diffuse erythema and mild swelling on bilateral inner arms from wrist up to mid arm. Also, with hives on anterior chest;  mild periorbital edema with erythema diffusely. no vesicles. No ulceration. Positive excoriation changes on arms, left greater than right   Impression &  Recommendations:  Problem # 1:  URTICARIA (ICD-708.9)  likely allergic reaction to poison ivy. Depo-Medrol 120 IM given today Continue oral antihistamine with Allegra as ongoing and topical hydrocortisone cream Avoid contact with poison ivy  Orders: Depo- Medrol 80mg  (J1040) Depo- Medrol 40mg  (J1030) Admin of Therapeutic Inj  intramuscular or subcutaneous (16109)  Complete Medication List: 1)  Cyclobenzaprine Hcl 10 Mg Tabs (Cyclobenzaprine hcl) .... Once daily 2)  Simvastatin 40 Mg Tabs (Simvastatin) .... One by mouth daily 3)  Flonase 50 Mcg/act Susp (Fluticasone propionate) .... Spray in each nostril two times a day 4)  Allegra 180 Mg Tabs  (Fexofenadine hcl) .... Qd 5)  Celexa 40 Mg Tabs (Citalopram hydrobromide) .... One by mouth daily 6)  Xanax Xr 1 Mg Tb24 (Alprazolam) .... 2 by mouth daily 7)  Climara Pro 0.045-0.015 Mg/day Ptwk (Estradiol-levonorgestrel) .... Apply weekly 8)  Lasix 40 Mg Tabs (Furosemide) .... One in am 9)  Lasix 20 Mg Tabs (Furosemide) .... One pm 10)  Klor-con M20 20 Meq Tbcr (Potassium chloride crys cr) .... One bid 11)  Oxycodone Hcl 5 Mg Tabs (Oxycodone hcl) .... One once daily 12)  Fish Oil 1000 Mg Caps (Omega-3 fatty acids) .... Take 1 by mouth qd  Patient Instructions: 1)  steroid injection given today for allergic reaction. 2)  Symptoms should improve shortly, but if continued itch and rash and next 72 hours, call our office for reevaluation or further treatment 3)  Avoid contact with poison ivy in the future 4)  Continue other medications as ongoing   Medication Administration  Injection # 1:    Medication: Depo- Medrol 80mg     Diagnosis: URTICARIA (ICD-708.9)    Route: IV    Site: LUOQ gluteus    Exp Date: 08/2011    Lot #: [id number]    Mfr: [employer]    Patient tolerated injection without complications    Given by: Orlan Leavens (March 11, 2009 1:58 PM)  Injection # 2:    Medication: Depo- Medrol 40mg     Diagnosis: URTICARIA (ICD-708.9)    Route: IV    Site: LUOQ gluteus    Exp Date: 08/2011    Lot #: [id number]    Mfr: [employer]  Orders Added: 1)  Est. Patient Level IV [60454] 2)  Depo- Medrol 80mg  [J1040] 3)  Depo- Medrol 40mg  [J1030] 4)  Admin of Therapeutic Inj  intramuscular or subcutaneous [09811]

## 2010-11-12 NOTE — Assessment & Plan Note (Signed)
Summary: SORE IN MOUTH EAR ACHE PROTIEN IN URINE/[facility]   Vital Signs:  Patient Profile:   55 Years Old Female Height:     66.75 inches Weight:      241 pounds Temp:     98.5 degrees F oral BP sitting:   110 / 84  (left arm) Cuff size:   large  Vitals Entered By: Kern Reap [name] (December 02, 2008 12:37 PM)                 Chief Complaint:  sore in mouth, ear pain, and protein uria.  History of Present Illness: Erica [name] is a 55 year old female, who comes in today for evaluation of multiple problems.  She states she was told by her gynecologist that she had protein in the urine.  She would like her urine rechecked.  She never had trouble with her renal function.  For the last 3, weeks she's had pain in the left side of her jaw.  For the last 3, days.  She's had pain in her right and left ear.  She points to the TM joint as the source of her pain.  She's had no, fever.  He's otherwise been well.  She is going to see a psychiatrist for violation of her anxiety and depression.  She doesn't like the psychiatrist.  Wants me to make a recommendation.  Advised her to talk to her current psychiatrist and get their recommendation    Updated Prior Medication List: CYCLOBENZAPRINE HCL 10 MG TABS (CYCLOBENZAPRINE HCL) once daily SIMVASTATIN 40 MG TABS (SIMVASTATIN) one by mouth daily FLONASE 50 MCG/ACT  SUSP (FLUTICASONE PROPIONATE) SPRAY IN EACH NOSTRIL two times a day ALLEGRA 180 MG  TABS (FEXOFENADINE HCL) QD CELEXA 40 MG  TABS (CITALOPRAM HYDROBROMIDE) one by mouth daily XANAX XR 1 MG  TB24 (ALPRAZOLAM) 2 by mouth daily CLIMARA PRO 0.045-0.015 MG/DAY  PTWK (ESTRADIOL-LEVONORGESTREL) apply weekly LASIX 40 MG  TABS (FUROSEMIDE) one in am LASIX 20 MG  TABS (FUROSEMIDE) one pm KLOR-CON M20 20 MEQ  TBCR (POTASSIUM CHLORIDE CRYS CR) one bid FLEXERIL 10 MG  TABS (CYCLOBENZAPRINE HCL) as needed OXYCODONE HCL 5 MG  TABS (OXYCODONE HCL) one once daily  Current Allergies (reviewed today): !  GUAIFENESIN-CODEINE (GUAIFENESIN-CODEINE)  Past Medical History:    Reviewed history from 11/27/2007 and no changes required:       hypertension       PAF       high cholesterol       panic attacks       right total knee hysterectomy with right oophorectomy.  No cancer       childbirth x 3   Social History:    Reviewed history from 11/27/2007 and no changes required:       Married       Never Smoked       Alcohol use-no       Drug use-no       Regular exercise-no    Review of Systems      See HPI   Physical Exam  General:     Well-developed,well-nourished,in no acute distress; alert,appropriate and cooperative throughout examination Head:     Normocephalic and atraumatic without obvious abnormalities. No apparent alopecia or balding. Eyes:     No corneal or conjunctival inflammation noted. EOMI. Perrla. Funduscopic exam benign, without hemorrhages, exudates or papilledema. Vision grossly normal. Ears:     External ear exam shows no significant lesions or deformities.  Otoscopic examination reveals clear canals,  tympanic membranes are intact bilaterally without bulging, retraction, inflammation or discharge. Hearing is grossly normal bilaterally. Nose:     External nasal examination shows no deformity or inflammation. Nasal mucosa are pink and moist without lesions or exudates. Mouth:     Oral mucosa and oropharynx without lesions or exudates.  Teeth in good repair.when she opens her mouth she developed severe pain in the left TMJ.  Also tender to palpation Neck:     No deformities, masses, or tenderness noted.    Impression & Recommendations:  Problem # 1:  PROTEINURIA (ICD-791.0)  Orders: UA Dipstick w/o Micro (manual) (16109)   Problem # 2:  TMJ SYNDROME (ICD-524.60) Assessment: New  Complete Medication List: 1)  Cyclobenzaprine Hcl 10 Mg Tabs (Cyclobenzaprine hcl) .... Once daily 2)  Simvastatin 40 Mg Tabs (Simvastatin) .... One by mouth daily 3)  Flonase  50 Mcg/act Susp (Fluticasone propionate) .... Spray in each nostril two times a day 4)  Allegra 180 Mg Tabs (Fexofenadine hcl) .... Qd 5)  Celexa 40 Mg Tabs (Citalopram hydrobromide) .... One by mouth daily 6)  Xanax Xr 1 Mg Tb24 (Alprazolam) .... 2 by mouth daily 7)  Climara Pro 0.045-0.015 Mg/day Ptwk (Estradiol-levonorgestrel) .... Apply weekly 8)  Lasix 40 Mg Tabs (Furosemide) .... One in am 9)  Lasix 20 Mg Tabs (Furosemide) .... One pm 10)  Klor-con M20 20 Meq Tbcr (Potassium chloride crys cr) .... One bid 11)  Flexeril 10 Mg Tabs (Cyclobenzaprine hcl) .... As needed 12)  Oxycodone Hcl 5 Mg Tabs (Oxycodone hcl) .... One once daily   Patient Instructions: 1)  for the TMJ syndrome to stay on a soft diet.  Avoid chewing gum.  Take Motrin 600 mg 3 times a day with food until the pain goes away.  It may also go to [employer] sports and purchase.  A mouth guard.  Where the mouthguard at bedtime, remove it in the morning if after a couple weeks.  The pain does not go away, and consult your dentist. 2)  There is a trace amount of protein in urine, it's not significant    Laboratory Results   Urine Tests    Routine Urinalysis   Color: yellow Appearance: Clear Glucose: negative   (Normal Range: Negative) Bilirubin: negative   (Normal Range: Negative) Ketone: negative   (Normal Range: Negative) Spec. Gravity: 1.015   (Normal Range: 1.003-1.035) Blood: negative   (Normal Range: Negative) pH: 7.0   (Normal Range: 5.0-8.0) Protein: trace   (Normal Range: Negative) Urobilinogen: 0.2   (Normal Range: 0-1) Nitrite: negative   (Normal Range: Negative) Leukocyte Esterace: negative   (Normal Range: Negative)

## 2010-11-12 NOTE — Assessment & Plan Note (Signed)
Summary: UTI? /NWS   Vital Signs:  Patient profile:   55 year old female Height:      66 inches (167.64 cm) Weight:      238.8 pounds (108.55 kg) O2 Sat:      97 % on Room air Temp:     98.2 degrees F (36.78 degrees C) oral Pulse rate:   85 / minute BP sitting:   122 / 70  (left arm) Cuff size:   regular  Vitals Entered By: Orlan Leavens (November 21, 2009 2:00 PM)  O2 Flow:  Room air CC: Having sxs of UTI, also pt have rash on groin area, itches Is Patient Diabetic? No Pain Assessment Patient in pain? no        Primary Care Provider:  [name]  CC:  Having sxs of UTI, also pt have rash on groin area, and itches.  History of Present Illness: here today with complaint of itching and burning with urination. onset of symptoms was 4 days ago. course has been gradual onset and now occurs in constant pattern. problem precipitated by treatment of hemrrhoids (which is improving) symptom characterized as itch and burn - between legs and thigh folds problem associated with darkening of skin and ?trace of bloodon toilet tissue but not associated with vaginal discharge or fever. symptoms improved by use of antibiotic lotion - "so the skin doesn't stick" - but not resolved. symptoms worsened with washing and rubbing. no prior hx of same symptoms.   Current Medications (verified): 1)  Cyclobenzaprine Hcl 10 Mg Tabs (Cyclobenzaprine Hcl) .... Once Daily 2)  Simvastatin 40 Mg Tabs (Simvastatin) .... One By Mouth Daily 3)  Flonase 50 Mcg/act  Susp (Fluticasone Propionate) .... Spray in Each Nostril Two Times A Day 4)  Allegra 180 Mg  Tabs (Fexofenadine Hcl) .... Qd 5)  Celexa 40 Mg  Tabs (Citalopram Hydrobromide) .... One By Mouth Daily 6)  Xanax Xr 1 Mg  Tb24 (Alprazolam) .... 2 By Mouth Daily 7)  Climara Pro 0.045-0.015 Mg/day  Ptwk (Estradiol-Levonorgestrel) .... Apply Weekly 8)  Lasix 40 Mg  Tabs (Furosemide) .... One in Am 9)  Lasix 20 Mg  Tabs (Furosemide) .... One  Pm 10)  Klor-Con M20 20 Meq  Tbcr (Potassium Chloride Crys Cr) .... One Bid 11)  Oxycodone Hcl 5 Mg  Tabs (Oxycodone Hcl) .... One Once Daily 12)  Fish Oil 1000 Mg Caps (Omega-3 Fatty Acids) .... Take 1 By Mouth Qd 13)  Miralax  Powd (Polyethylene Glycol 3350) .Marland Kitchen.. 17g By Mouth Two Times A Day in 8oz Drink 14)  Analpram-Hc 1-2.5 % Lotn (Hydrocortisone Ace-Pramoxine) .... Apply To Rectum Two Times A Day As Needed For Pain/hemrrhoids  Allergies (verified): 1)  ! Guaifenesin-Codeine (Guaifenesin-Codeine) 2)  ! Percocet  Past History:  Past Medical History: HEMORRHOIDS, EXTERNAL (ICD-455.3) CONSTIPATION, CHRONIC (ICD-564.09) MORBID OBESITY (ICD-278.01) URTICARIA (ICD-708.9) TMJ SYNDROME (ICD-524.60) PROTEINURIA (ICD-791.0) DEPRESSION, MAJOR, MILD (ICD-296.21) ALLERGIC RHINITIS (ICD-477.9) HYPERLIPIDEMIA (ICD-272.4) ESSENTIAL HYPERTENSION, BENIGN (ICD-401.1)      Review of Systems  The patient denies fever, weight loss, chest pain, syncope, dyspnea on exertion, peripheral edema, and headaches.    Physical Exam  General:  overweight-appearing but alert, well-developed, well-nourished, and cooperative to examination.    Lungs:  normal respiratory effort, no intercostal retractions or use of accessory muscles; normal breath sounds bilaterally - no crackles and no wheezes.    Heart:  normal rate, regular rhythm, no murmur, and no rub. BLE without edema. Skin:  +candidiasis changes in  bilateral groin    Impression & Recommendations:  Problem # 1:  CANDIDIASIS OF UNSPECIFIED SITE (ICD-112.9)  treat with oral diflucan and lotrisone cream - no evidence of UTI on UA - hold on other abx - dysuria can be caused by yeast as well - explained to pt same  Orders: Prescription Created Electronically [phone])  Problem # 2:  UTI (ICD-599.0) see above - Orders: UA Dipstick w/o Micro (manual) ([id number]) Prescription Created Electronically [phone])  Complete Medication List: 1)   Cyclobenzaprine Hcl 10 Mg Tabs (Cyclobenzaprine hcl) .... Once daily 2)  Simvastatin 40 Mg Tabs (Simvastatin) .... One by mouth daily 3)  Flonase 50 Mcg/act Susp (Fluticasone propionate) .... Spray in each nostril two times a day 4)  Allegra 180 Mg Tabs (Fexofenadine hcl) .... Qd 5)  Celexa 40 Mg Tabs (Citalopram hydrobromide) .... One by mouth daily 6)  Xanax Xr 1 Mg Tb24 (Alprazolam) .... 2 by mouth daily 7)  Climara Pro 0.045-0.015 Mg/day Ptwk (Estradiol-levonorgestrel) .... Apply weekly 8)  Lasix 40 Mg Tabs (Furosemide) .... One in am 9)  Lasix 20 Mg Tabs (Furosemide) .... One pm 10)  Klor-con M20 20 Meq Tbcr (Potassium chloride crys cr) .... One bid 11)  Oxycodone Hcl 5 Mg Tabs (Oxycodone hcl) .... One once daily 12)  Fish Oil 1000 Mg Caps (Omega-3 fatty acids) .... Take 1 by mouth qd 13)  Miralax Powd (Polyethylene glycol 3350) .Marland Kitchen.. 17g by mouth two times a day in 8oz drink 14)  Analpram-hc 1-2.5 % Lotn (Hydrocortisone ace-pramoxine) .... Apply to rectum two times a day as needed for pain/hemrrhoids 15)  Fluconazole 150 Mg Tabs (Fluconazole) .Marland Kitchen.. 1 by mouth once daily x 1 dose 16)  Lotrisone 1-0.05 % Crea (Clotrimazole-betamethasone) .... Apply to affected areas/itch two times a day x 7 days, then as needed  Patient Instructions: 1)  it was good to see you today.  2)  diflucan pill and lotrisone cream to affected skin for yeast infection -your prescriptions have been electronically submitted to your pharmacy. Please take as directed. Contact our office if you believe you're having problems with the medication(s).  3)  Please schedule a follow-up appointment as needed. Prescriptions: LOTRISONE 1-0.05 % CREA (CLOTRIMAZOLE-BETAMETHASONE) apply to affected areas/itch two times a day x 7 days, then as needed  #1 x 0   Entered and Authorized by:   [name]   Signed by:   [name] on 11/21/2009   Method used:   Electronically to        [facility]  [address] [id number]  (retail)       [address]       [place], [place]  [address]       Ph: [phone] or [phone]       Fax: [phone]   RxID:   [id number] FLUCONAZOLE 150 MG TABS (FLUCONAZOLE) 1 by mouth once daily x 1 dose  #1 x 0   Entered and Authorized by:   [name]   Signed by:   [name] on 11/21/2009   Method used:   [facility]ronically to        [facility]  [address] [id number] (ret[facility])       [address]       [place], [place]  [address]       Ph: [phone] or [phone]       Fax:  [phone]   RxID:   [id number]   Laboratory Results   Urine Tests    Routine Urinalysis   Color: colorless Appearance: Clear Glucose: negative   (Normal Range: Negative) Bilirubin: negative   (Normal Range: Negative) Ketone: negative   (Normal Range: Negative) Spec. Gravity: <1.005   (Normal Range: 1.003-1.035) Blood: negative   (Normal Range: Negative) pH: 5.0   (Normal Range: 5.0-8.0) Protein: negative   (Normal Range: Negative) Urobilinogen: 0.2   (Normal Range: 0-1) Nitrite: negative   (Normal Range: Negative) Leukocyte Esterace: negative   (Normal Range: Negative)

## 2010-11-12 NOTE — Progress Notes (Signed)
Summary: Rx req  Phone Note Refill Request Message from:  Patient on April 14, 2010 11:48 AM  Refills Requested: Medication #1:  SIMVASTATIN 40 MG TABS one by mouth daily   Dosage confirmed as above?Dosage Confirmed   Brand Name Necessary? No   Supply Requested: 1 month Pt is requesting a #30 day supply until [place] is able to send out mail order   Method Requested: Electronic Initial call taken by: Margaret Pyle, [name],  April 14, 2010 11:48 AM    Prescriptions: SIMVASTATIN 40 MG TABS (SIMVASTATIN) one by mouth daily  #30 x 0   Entered by:   Margaret Pyle, [name]   Authorized by:   [name]   Signed by:   Margaret Pyle, [name] on 04/14/2010   Method used:   [facility] to        [id number]       [address]       [place], [place]  [address]       Ph: [phone] or [phone]       Fax: [phone]   RxID:   [id number]

## 2010-11-12 NOTE — Assessment & Plan Note (Signed)
Summary: cpx/no pap/njr rsc per doc/njr   Vital Signs:  Patient Profile:   55 Years Old Female Height:     66.75 inches Weight:      218.5 pounds Temp:     98.4 degrees F Pulse rate:   80 / minute BP sitting:   138 / 100  Vitals Entered By: Sindy Guadeloupe RN (January 12, 2008 12:49 PM)                 Chief Complaint:  cpx--no pap--sees gyn.  History of Present Illness: Erica [name] is a 55 year old female, who comes in today for physical exam because of underlying hypertension, hyperlipidemia, depression, allergic rhinitis.  Her high blood pressures, treated with potassium 20 mEq 2 tablets daily, Lasix 40 mg in the morning and 20 at noon.  Her blood pressures under good control with that program.  The hyperlipidemia is treated with Zocor 40 mg nightly.  She's had no side effects from medication.  The depression is treated by her psychiatrist with Celexa 40 mg a day.  Also, they give her Xanax 2 mg b.i.d.  Her allergic rhinitis is treated with Allegra 180 mg daily, along with the Flonase nasal spray.  She is up.  She is up on her health maintenance.  Activities.  She had a papular GYNs office.  She also sees orthopedics yearly because she had a right total knee replacement in 2006.    Current Allergies (reviewed today): ! GUAIFENESIN-CODEINE (GUAIFENESIN-CODEINE)  Past Medical History:    Reviewed history from 11/27/2007 and no changes required:       hypertension       PAF       high cholesterol       panic attacks       right total knee hysterectomy with right oophorectomy.  No cancer       childbirth x 3   Social History:    Reviewed history from 11/27/2007 and no changes required:       Married       Never Smoked       Alcohol use-no       Drug use-no       Regular exercise-no    Review of Systems      See HPI   Physical Exam  General:     Well-developed,well-nourished,in no acute distress; alert,appropriate and cooperative throughout examination Head:      Normocephalic and atraumatic without obvious abnormalities. No apparent alopecia or balding. Eyes:     No corneal or conjunctival inflammation noted. EOMI. Perrla. Funduscopic exam benign, without hemorrhages, exudates or papilledema. Vision grossly normal. Ears:     External ear exam shows no significant lesions or deformities.  Otoscopic examination reveals clear canals, tympanic membranes are intact bilaterally without bulging, retraction, inflammation or discharge. Hearing is grossly normal bilaterally. Nose:     External nasal examination shows no deformity or inflammation. Nasal mucosa are pink and moist without lesions or exudates. Mouth:     Oral mucosa and oropharynx without lesions or exudates.  Teeth in good repair. Neck:     No deformities, masses, or tenderness noted. Chest Wall:     No deformities, masses, or tenderness noted. Breasts:     No mass, nodules, thickening, tenderness, bulging, retraction, inflamation, nipple discharge or skin changes noted.   Lungs:     Normal respiratory effort, chest expands symmetrically. Lungs are clear to auscultation, no crackles or wheezes. Heart:     Normal rate and  regular rhythm. S1 and S2 normal without gallop, murmur, click, rub or other extra sounds. Abdomen:     Bowel sounds positive,abdomen soft and non-tender without masses, organomegaly or hernias noted. Msk:     No deformity or scoliosis noted of thoracic or lumbar spine.   Pulses:     R and L carotid,radial,femoral,dorsalis pedis and posterior tibial pulses are full and equal bilaterally Extremities:     No clubbing, cyanosis, edema, or deformity noted with normal full range of motion of all joints.   Neurologic:     No cranial nerve deficits noted. Station and gait are normal. Plantar reflexes are down-going bilaterally. DTRs are symmetrical throughout. Sensory, motor and coordinative functions appear intact. Skin:     scar right anterior knee from previous total knee  replacement Cervical Nodes:     No lymphadenopathy noted Axillary Nodes:     No palpable lymphadenopathy Inguinal Nodes:     No significant adenopathy Psych:     Cognition and judgment appear intact. Alert and cooperative with normal attention span and concentration. No apparent delusions, illusions, hallucinations    Impression & Recommendations:  Problem # 1:  ESSENTIAL HYPERTENSION, BENIGN (ICD-401.1) Assessment: Improved  Her updated medication list for this problem includes:    Lasix 40 Mg Tabs (Furosemide) ..... One in am    Lasix 20 Mg Tabs (Furosemide) ..... One pm   Problem # 2:  HYPERLIPIDEMIA (ICD-272.4) Assessment: Improved  Her updated medication list for this problem includes:    Simvastatin 40 Mg Tabs (Simvastatin) ..... One by mouth daily   Problem # 3:  ALLERGIC RHINITIS (ICD-477.9) Assessment: Unchanged  Her updated medication list for this problem includes:    Flonase 50 Mcg/act Susp (Fluticasone propionate) ..... Spray in each nostril two times a day    Allegra 180 Mg Tabs (Fexofenadine hcl) ..... Qd   Problem # 4:  DEPRESSION, MAJOR, MILD (ICD-296.21) Assessment: Improved  Complete Medication List: 1)  Cyclobenzaprine Hcl 10 Mg Tabs (Cyclobenzaprine hcl) .... Once daily 2)  Simvastatin 40 Mg Tabs (Simvastatin) .... One by mouth daily 3)  Flonase 50 Mcg/act Susp (Fluticasone propionate) .... Spray in each nostril two times a day 4)  Allegra 180 Mg Tabs (Fexofenadine hcl) .... Qd 5)  Celexa 40 Mg Tabs (Citalopram hydrobromide) .... One by mouth daily 6)  Alprazolam 1 Mg Tabs (Alprazolam) .... 2 q hs 7)  Xanax Xr 1 Mg Tb24 (Alprazolam) .... 2 by mouth daily 8)  Climara Pro 0.045-0.015 Mg/day Ptwk (Estradiol-levonorgestrel) .... Apply weekly 9)  Lasix 40 Mg Tabs (Furosemide) .... One in am 10)  Lasix 20 Mg Tabs (Furosemide) .... One pm 11)  Klor-con M20 20 Meq Tbcr (Potassium chloride crys cr) .... One bid 12)  Flexeril 10 Mg Tabs (Cyclobenzaprine  hcl) .... As needed 13)  Oxycodone Hcl 5 Mg Tabs (Oxycodone hcl) .... One once daily  Other Orders: EKG w/ Interpretation ([id number])   Patient Instructions: 1)  Please schedule a follow-up appointment in 1 year. 2)  It is important that you exercise regularly at least 20 minutes 5 times a week. If you develop chest pain, have severe difficulty breathing, or feel very tired , stop exercising immediately and seek medical attention. 3)  You need to lose weight. Consider a lower calorie diet and regular exercise.  4)  Schedule your mammogram. 5)  Take calcium +Vitamin D daily. 6)  Take an Aspirin every day.    Prescriptions: KLOR-CON M20 20 MEQ  TBCR (POTASSIUM CHLORIDE CRYS  CR) one bid  #200 x 4   Entered and Authorized by:   Roderick Pee MD   Signed by:   Roderick Pee MD on 01/12/2008   Method used:   Print then Give to Patient   RxID:   [id number] LASIX 20 MG  TABS (FUROSEMIDE) one pm  #100 x 4   Entered and Authorized by:   Roderick Pee MD   Signed by:   Roderick Pee MD on 01/12/2008   Method used:   Print then Give to Patient   RxID:   [id number] LASIX 40 MG  TABS (FUROSEMIDE) one in am  #100 x 4   Entered and Authorized by:   Roderick Pee MD   Signed by:   Roderick Pee MD on 01/12/2008   Method used:   Print then Give to Patient   RxID:   [id number] CELEXA 40 MG  TABS (CITALOPRAM HYDROBROMIDE) one by mouth daily  #100 x 4   Entered and Authorized by:   Roderick Pee MD   Signed by:   Roderick Pee MD on 01/12/2008   Method used:   Print then Give to Patient   RxID:   [id number] ALLEGRA 180 MG  TABS (FEXOFENADINE HCL) QD  #100 x 4   Entered and Authorized by:   Roderick Pee MD   Signed by:   Roderick Pee MD on 01/12/2008   Method used:   Print then Give to Patient   RxID:   [id number] YSAYTKZ 50 MCG/ACT  SUSP (FLUTICASONE PROPIONATE) SPRAY IN EACH NOSTRIL two times a day  #3 x 4   Entered and Authorized by:   Roderick Pee MD    Signed by:   Roderick Pee MD on 01/12/2008   Method used:   Print then Give to Patient   RxID:   [id number] SIMVASTATIN 40 MG TABS (SIMVASTATIN) one by mouth daily  #100 x 4   Entered and Authorized by:   Roderick Pee MD   Signed by:   Roderick Pee MD on 01/12/2008   Method used:   Print then Give to Patient   RxID:   [id number]  ]

## 2010-11-12 NOTE — Assessment & Plan Note (Signed)
Summary: constipation/Erica [name]   History of Present Illness Visit Type: consult Primary GI [name]: Lina Sar [name] Primary Provider: [name] Requesting Provider: [name] Chief Complaint: Upper abd pain, constipation, hemorrhoids, nausea, and loss of appetite  History of Present Illness:   This is a 55 year old African American female with severe constipation. She has bowel movements once or twice a week. Her abdomen becomes distended. She has tried over-the-counter laxatives such as Senokot and fleets enemas. A colonoscopy in July 2005 was normal. She has obstructive sleep apnea and high blood pressure. There is no family history of colon cancer. There is no blood per rectum. She has tried Clinical biochemist but it is not covered by her insurance and she cannot afford it.   GI Review of Systems    Reports abdominal pain, bloating, loss of appetite, and  nausea.     Location of  Abdominal pain: upper abdomen.    Denies acid reflux, belching, chest pain, dysphagia with liquids, dysphagia with solids, heartburn, vomiting, vomiting blood, weight loss, and  weight gain.      Reports constipation and  hemorrhoids.     Denies anal fissure, black tarry stools, change in bowel habit, diarrhea, diverticulosis, fecal incontinence, heme positive stool, irritable bowel syndrome, jaundice, light color stool, liver problems, rectal bleeding, and  rectal pain.    Current Medications (verified): 1)  Cyclobenzaprine Hcl 10 Mg Tabs (Cyclobenzaprine Hcl) .... Once Daily 2)  Simvastatin 40 Mg Tabs (Simvastatin) .... One By Mouth Daily 3)  Flonase 50 Mcg/act  Susp (Fluticasone Propionate) .... Spray in Each Nostril Two Times A Day 4)  Allegra 180 Mg  Tabs (Fexofenadine Hcl) .... Qd 5)  Celexa 40 Mg  Tabs (Citalopram Hydrobromide) .... One By Mouth Daily 6)  Xanax Xr 1 Mg  Tb24 (Alprazolam) .... 2 By Mouth Daily 7)  Climara Pro 0.045-0.015 Mg/day  Ptwk (Estradiol-Levonorgestrel) .... Apply Weekly 8)   Lasix 40 Mg  Tabs (Furosemide) .... One in Am 9)  Lasix 20 Mg  Tabs (Furosemide) .... One Pm 10)  Klor-Con M20 20 Meq  Tbcr (Potassium Chloride Crys Cr) .... One Bid 11)  Oxycodone Hcl 5 Mg  Tabs (Oxycodone Hcl) .... Take 1 Tablet By Mouth Once A Day As Needed 12)  Fish Oil 1000 Mg Caps (Omega-3 Fatty Acids) .... Take 1 By Mouth Qd 13)  Reglan 10 Mg Tabs (Metoclopramide Hcl) .Marland Kitchen.. 1 By Mouth Four Times A Day As Needed For Nausea  Allergies (verified): 1)  ! Guaifenesin-Codeine (Guaifenesin-Codeine)  Past History:  Past Medical History: hypertension PAF dyslipidemia panic attacks, depression hx OA allergic rhinitis obesity  OSA - CPAP hs   CHANGE IN BOWELS (ICD-787.99) FATIGUE (ICD-780.79) ANTIHYPERLIPIDEMIC USE, LONG TERM (ICD-V58.69) CANDIDIASIS OF UNSPECIFIED SITE (ICD-112.9) UTI (ICD-599.0) OBSTRUCTIVE SLEEP APNEA (ICD-327.23) HEMORRHOIDS, EXTERNAL (ICD-455.3) CONSTIPATION, CHRONIC (ICD-564.09) MORBID OBESITY (ICD-278.01) URTICARIA (ICD-708.9) TMJ SYNDROME (ICD-524.60) PROTEINURIA (ICD-791.0) DEPRESSION, MAJOR, MILD (ICD-296.21) ALLERGIC RHINITIS (ICD-477.9) HYPERLIPIDEMIA (ICD-272.4) ESSENTIAL HYPERTENSION, BENIGN (ICD-401.1) PHYSICAL EXAMINATION (ICD-V70.0) UPPER RESPIRATORY INFECTION, VIRAL (ICD-465.9)  [name] roster: Oliver Hum        Past Surgical History: Reviewed history from 06/18/2010 and no changes required. right total knee replacement hysterectomy  right oophorectomy.  No cancer childbirth x 3  Family History: dad - 27, dementia s/p THR s/p fx 2010 - not doing well mom - "mass in spine" allergies: 2 sisters asthma: son and grandson heart disease: mother  Family History of Breast Cancer: Maternal Aunt Family History of Colon Cancer:Maternal Cousin  Social History: Married with children. Never Smoked pt is on disability.  previously worked in the school system.  Alcohol use-no Drug use-no Regular exercise-no Daily Caffeine Use: pepsi  and green tea daily   Review of Systems       The patient complains of allergy/sinus, anxiety-new, arthritis/joint pain, back pain, depression-new, fatigue, headaches-new, muscle pains/cramps, swelling of feet/legs, and thirst - excessive.  The patient denies anemia, blood in urine, breast changes/lumps, change in vision, confusion, cough, coughing up blood, fainting, fever, hearing problems, heart murmur, heart rhythm changes, itching, menstrual pain, night sweats, nosebleeds, pregnancy symptoms, shortness of breath, skin rash, sleeping problems, sore throat, swollen lymph glands, urination - excessive, urination changes/pain, urine leakage, vision changes, and voice change.         Pertinent positive and negative review of systems were noted in the above HPI. All other ROS was otherwise negative.   Vital Signs:  Patient profile:   55 year old female Height:      68 inches Weight:      234 pounds BMI:     35.71 BSA:     2.19 Pulse rate:   88 / minute Pulse rhythm:   regular BP sitting:   128 / 74  (left arm) Cuff size:   large  Vitals Entered By: Ok Anis [name] (June 23, 2010 9:01 AM)  Physical Exam  General:  Well developed, well nourished, no acute distress. Eyes:  PERRLA, no icterus. Mouth:  No deformity or lesions, dentition normal. Neck:  Supple; no masses or thyromegaly. Lungs:  Clear throughout to auscultation. Heart:  Regular rate and rhythm; no murmurs, rubs,  or bruits. Abdomen:  soft protuberant abdomen nontender except in epigastrium. Normoactive bowel sounds. Lower abdomen unremarkable. No scar tissue. No ascites. Liver edge at costal margin. Rectal:  decreased rectal sphincter tone. Soft Hemoccult-negative stool. Msk:  Symmetrical with no gross deformities. Normal posture. Extremities:  No clubbing, cyanosis, edema or deformities noted. Skin:  Intact without significant lesions or rashes. Psych:  Alert and cooperative. Normal mood and affect.   Impression &  Recommendations:  Problem # 1:  CONSTIPATION (ICD-564.00) Patient has chronic functional constipation. She has inadequate fiber intake. Patient cannot afford over-the-counter laxatives. We will proceed with a Sitzmarks study first to assess her colonic motility. We will have her purge her bowels using polyethelyne glycol. The cheap laxatives on the market which we will plan to use afterwards will be milk of magnesia 45 cc twice a day or mineral oil in apple sauce 15 on cc twice a day. She can also use Senokot 2-3 tablets daily. I discouraged her from using magnesium citrate. She would not be a good candidate for Amitiza or lactulose because of its expense. Her colonoscopy was in 2005. We will consider a repeat colonoscopy at some point. Some of her medications are contributing to her constipation such as Lasix and oxycodone.  Orders: KUB for SITZ Marks (KUB for SITZ)  Patient Instructions: 1)  We have given you written instructions on purging your bowels with Dulcolax. We have also given you Dulocolax samples. Please follow those written instructions today. 2)  After Tuesday, DO NOT take any laxative, stool softeners or ANYTHING that will alter your bowels. 3)  On Sunday Morning, take the capsule we have given you. You will need to make sure that you drink AT LEAST 8 ounces of water when you ingest the capsule. 4)  Please return to [facility] on Friday Morning for your x-ray.  You have been given an order to take with you to radiology. 5)  Please call our office with any questions or concerns. You may ask to speak to [facility]. 6)  Copy sent to : Dr Otho Najjar 7)  The medication list was reviewed and reconciled.  All changed / newly prescribed medications were explained.  A complete medication list was provided to the patient / caregiver.

## 2010-11-12 NOTE — Assessment & Plan Note (Signed)
Summary: rov for osa   Copy to:  Rene Paci Primary Provider/Referring Provider:  [name]  CC:  OSA. CPAP follow-up.  The patient says she is wearing her CPAP every night and sleeps for 8 hours plus. She did bring her mask and says the strap that goes behind her head came off..  History of Present Illness: the pt comes in today for f/u of her osa.  She was started on cpap at the last visit, and has been compliant with therapy.  She has no issues with the pressure, but a strap did come off her mask which I fixed easily during our visit.  She does feel she sleeps better, and has seen some improvement in her daytime symptoms.  Current Medications (verified): 1)  Cyclobenzaprine Hcl 10 Mg Tabs (Cyclobenzaprine Hcl) .... Once Daily 2)  Simvastatin 40 Mg Tabs (Simvastatin) .... One By Mouth Daily 3)  Flonase 50 Mcg/act  Susp (Fluticasone Propionate) .... Spray in Each Nostril Two Times A Day 4)  Allegra 180 Mg  Tabs (Fexofenadine Hcl) .... Qd 5)  Celexa 40 Mg  Tabs (Citalopram Hydrobromide) .... One By Mouth Daily 6)  Xanax Xr 1 Mg  Tb24 (Alprazolam) .... 2 By Mouth Daily 7)  Climara Pro 0.045-0.015 Mg/day  Ptwk (Estradiol-Levonorgestrel) .... Apply Weekly 8)  Lasix 40 Mg  Tabs (Furosemide) .... One in Am 9)  Lasix 20 Mg  Tabs (Furosemide) .... One Pm 10)  Klor-Con M20 20 Meq  Tbcr (Potassium Chloride Crys Cr) .... One Bid 11)  Oxycodone Hcl 5 Mg  Tabs (Oxycodone Hcl) .... Take 1 Tablet By Mouth Once A Day As Needed 12)  Fish Oil 1000 Mg Caps (Omega-3 Fatty Acids) .... Take 1 By Mouth Qd 13)  Miralax  Powd (Polyethylene Glycol 3350) .Marland Kitchen.. 17g By Mouth Two Times A Day in 8oz Drink 14)  Analpram-Hc 1-2.5 % Lotn (Hydrocortisone Ace-Pramoxine) .... Apply To Rectum Two Times A Day As Needed For Pain/hemrrhoids 15)  Lotrisone 1-0.05 % Crea (Clotrimazole-Betamethasone) .... Apply To Affected Areas/itch Two Times A Day X 7 Days, Then As Needed  Allergies (verified): 1)  !  Guaifenesin-Codeine (Guaifenesin-Codeine)  Review of Systems      See HPI  Vital Signs:  Patient profile:   55 year old female Height:      68 inches (172.72 cm) Weight:      240 pounds (109.09 kg) BMI:     36.62 O2 Sat:      93 % on Room air Temp:     98.4 degrees F (36.89 degrees C) oral Pulse rate:   87 / minute BP sitting:   112 / 80  (right arm) Cuff size:   regular  Vitals Entered By: Michel Bickers [name] (January 07, 2010 11:59 AM)  O2 Sat at Rest %:  93 O2 Flow:  Room air  Physical Exam  General:  obese female in nad Nose:  no skin breakdown or pressure necrosis from cpap mask Neurologic:  alert, not sleepy, moves all 4.   Impression & Recommendations:  Problem # 1:  OBSTRUCTIVE SLEEP APNEA (ICD-327.23) the pt has adapted fairly well to cpap, and feels that her sleep and daytime symptoms are better.  At this point, she will need to have her pressure optimized on her cpap device, and I have also encouraged her to work on weight loss.  I will call her with the results of her autotitration study.  Medications Added to Medication List This Visit: 1)  Nystatin 100000 Unit/ml Susp (Nystatin) .... Take one teaspoonful by mouth swish & swallow four times a day x 5 days  Other Orders: Est. Patient Level III (81191) DME Referral (DME)  Patient Instructions: 1)  stay on cpap.  will get your pressure optimized for you, and let you know the results. 2)  work on weight loss 3)  will give you a mouthwash for your thrush 4)  change your nasal spray to 2 each nostril once a day. 5)  followup with me in 6mos, or sooner if having problems with cpap.  Prescriptions: NYSTATIN 100000 UNIT/ML  SUSP (NYSTATIN) Take one teaspoonful by mouth swish & swallow four times a day x 5 days  #1 x 0   Entered and Authorized by:   Barbaraann Share MD   Signed by:   Barbaraann Share MD on 01/07/2010   Method used:   Print then Give to Patient   RxID:   [id number]

## 2010-11-12 NOTE — Progress Notes (Signed)
Summary: sleep study  Phone Note Call from Patient Call back at Home Phone [phone]   Caller: Patient Call For: [facility]ance Summary of Call: pt wants a copy of her sleep study mailed to her. (pt just left our office).  Initial call taken by: Tivis Ringer, CNA,  December 03, 2009 4:18 PM  Follow-up for Phone Call        [facility] Vernie Murders  December 03, 2009 4:30 PM   Additional Follow-up for Phone Call Additional follow up Details #1::        go into echart and print formal report to send to her.  thanks. Additional Follow-up by: Barbaraann Share MD,  December 03, 2009 6:17 PM    Additional Follow-up for Phone Call Additional follow up Details #2::    Report printed from E-chart and the patient is aware we will mail this to her home. Verfied address with pt.Michel Bickers [facility]  December 04, 2009 9:41 AM

## 2010-11-18 NOTE — Assessment & Plan Note (Signed)
Summary: DOG BITE/NWS   Vital Signs:  Patient profile:   55 year old female Height:      68 inches (172.72 cm) O2 Sat:      97 % on Room air Temp:     98.6 degrees F (37.00 degrees C) oral Pulse rate:   72 / minute BP sitting:   120 / 82  (left arm) Cuff size:   large  Vitals Entered By: Orlan Leavens RMA (November 11, 2010 1:51 PM)  O2 Flow:  Room air CC: Dog bite on (L) arm Is Patient Diabetic? No Pain Assessment Patient in pain? no        Primary Care Provider:  [name]  CC:  Dog bite on (L) arm.  History of Present Illness: dog bite to left elbow region dog is pateint's dog, uptodate on rabies and other vaccines bite was provoked - picking dog up when it was sick mild swelling of forearm - no pain in arm or elbow except at site of bite - washed immediately with H2O2 and water - using abx oint to wound  also reviewed chronic med issues: constipation -seeing GI for same a/w abd bloating but no pain in abd or rectum - very small hard bowel "pellets" and gas, no melena or diarrhea - reports no improvement symptoms with milk of mag or sennakot plus - no n/v - no fever denies use of narcotics or change in meds  OSA - still c/o fatigue - ongoing issues with mask fit reviewed - seeing pulm for same - family notes less snoring with CPAP -   HTN - reports compliance with ongoing medical treatment and no changes in medication dose or frequency. denies adverse side effects related to current therapy. trace edema, no CP or HA, no weakness or vison changes  dyslipidemia - reports compliance with ongoing medical treatment and no changes in medication dose or frequency. denies adverse side effects related to current therapy.  no GI or muscle c/o  back pain - seeing ramos for same - due for steroid shot in back soon - no radiation down legs or change in nature of pain  depression - reports counselor recently tried generic wellbutrin - increased tearfulness so pt  stopped - still on citalopram daily and xanax as needed  - no SI/HI  Clinical Review Panels:  Immunizations   Last Tetanus Booster:  Historical (03/17/2004)   Last Pneumovax:  Pneumovax (01/30/2009)  CBC   WBC:  6.1 (06/29/2010)   RBC:  4.02 (06/29/2010)   Hgb:  11.8 (06/29/2010)   Hct:  35.0 (06/29/2010)   Platelets:  218.0 (06/29/2010)   MCV  87.2 (06/29/2010)   MCHC  33.7 (06/29/2010)   RDW  14.9 (06/29/2010)   PMN:  66.4 (06/29/2010)   Lymphs:  24.7 (06/29/2010)   Monos:  7.6 (06/29/2010)   Eosinophils:  0.9 (06/29/2010)   Basophil:  0.4 (06/29/2010)   Current Medications (verified): 1)  Cyclobenzaprine Hcl 10 Mg Tabs (Cyclobenzaprine Hcl) .... Once Daily 2)  Simvastatin 40 Mg Tabs (Simvastatin) .... One By Mouth Daily 3)  Flonase 50 Mcg/act  Susp (Fluticasone Propionate) .... Spray in Each Nostril Two Times A Day 4)  Allegra 180 Mg  Tabs (Fexofenadine Hcl) .... Qd 5)  Celexa 40 Mg  Tabs (Citalopram Hydrobromide) .... One By Mouth Daily 6)  Xanax Xr 1 Mg  Tb24 (Alprazolam) .... 2 By Mouth Daily 7)  Climara Pro 0.045-0.015 Mg/day  Ptwk (Estradiol-Levonorgestrel) .... Apply Weekly 8)  Lasix 40 Mg  Tabs (Furosemide) .... One in Am 9)  Lasix 20 Mg  Tabs (Furosemide) .... One Pm 10)  Klor-Con M20 20 Meq  Tbcr (Potassium Chloride Crys Cr) .... One Bid 11)  Oxycodone Hcl 5 Mg  Tabs (Oxycodone Hcl) .... Take 1 Tablet By Mouth Once A Day As Needed 12)  Fish Oil 1000 Mg Caps (Omega-3 Fatty Acids) .... Take 1 By Mouth Qd 13)  Reglan 10 Mg Tabs (Metoclopramide Hcl) .Marland Kitchen.. 1 By Mouth Four Times A Day As Needed For Nausea 14)  Cpap Machine .... At Bedtime  Allergies (verified): 1)  ! Guaifenesin-Codeine (Guaifenesin-Codeine)  Past History:  Past Medical History: hypertension PAF dyslipidemia panic attacks, depression hx OA allergic rhinitis obesity  OSA - CPAP hs hypertension  MD roster:  pulm - [facility] - [facility]- ramos gyn- counseling- [facility]        Review of Systems  The patient denies fever and syncope.    Physical Exam  General:  overweight-appearing but alert, well-developed, well-nourished, and cooperative to examination.   uncomfortable but nontoxic Lungs:  normal respiratory effort, no intercostal retractions or use of accessory muscles; normal breath sounds bilaterally - no crackles and no wheezes.    Heart:  normal rate, regular rhythm, no murmur, and no rub. BLE without edema. Skin:  5 small superfical skin tears over extensor surface of left forearm (shape of dog bite), distal to elbow/olecranon - no punture or deep wound, no cellulitis, no drainage - mild swelling but not comparitively warm to touch (comp to right) Psych:  Oriented X3, memory intact for recent and remote, normally interactive, good eye contact, dysphoric but not anxious appearing, mild depressed appearing, and not agitated.      Impression & Recommendations:  Problem # 1:  DOG BITE (ICD-E906.0)  dog uptodate on shots, pt uptodate on td - wound inflicted by pt's own dog - see below for mgmt of laceration  Orders: Prescription Created Electronically [phone])  Problem # 2:  LACERATION (ICD-879.8)  emepric abx - augmentin and wound care reviewed -  related to dog bite - see above  Orders: Prescription Created Electronically [phone])  Complete Medication List: 1)  Cyclobenzaprine Hcl 10 Mg Tabs (Cyclobenzaprine hcl) .... Once daily 2)  Simvastatin 40 Mg Tabs (Simvastatin) .... One by mouth daily 3)  Flonase 50 Mcg/act Susp (Fluticasone propionate) .... Spray in each nostril two times a day 4)  Allegra 180 Mg Tabs (Fexofenadine hcl) .... Qd 5)  Celexa 40 Mg Tabs (Citalopram hydrobromide) .... One by mouth daily 6)  Xanax Xr 1 Mg Tb24 (Alprazolam) .... 2 by mouth daily 7)  Climara Pro 0.045-0.015 Mg/day Ptwk (Estradiol-levonorgestrel) .... Apply weekly 8)  Lasix 40 Mg Tabs (Furosemide) .... One in am 9)  Lasix 20 Mg Tabs (Furosemide) .... One  pm 10)  Klor-con M20 20 Meq Tbcr (Potassium chloride crys cr) .... One bid 11)  Oxycodone Hcl 5 Mg Tabs (Oxycodone hcl) .... Take 1 tablet by mouth once a day as needed 12)  Fish Oil 1000 Mg Caps (Omega-3 fatty acids) .... Take 1 by mouth qd 13)  Reglan 10 Mg Tabs (Metoclopramide hcl) .Marland Kitchen.. 1 by mouth four times a day as needed for nausea 14)  Cpap Machine  .... At bedtime 15)  Augmentin 875-125 Mg Tabs (Amoxicillin-pot clavulanate) .Marland Kitchen.. 1 by mouth two times a day x 10days  Patient Instructions: 1)  it was good to see you today. 2)  use augmentin antibiotics and triple  antibioitc ointment - your prescription has been electronically submitted to your pharmacy. Please take as directed. Contact our office if you believe you're having problems with the medication(s).  3)  continue washing the dog bite wound as you are doing and keep covered with bandaid until wound scabbed/healed 4)  Get plenty of rest, drink lots of clear liquids, and use Tylenol or Ibuprofen for fever and comfort. Return in 7-10 days if you're not better:sooner if you're feeling worse. Prescriptions: AUGMENTIN 875-125 MG TABS (AMOXICILLIN-POT CLAVULANATE) 1 by mouth two times a day x 10days  #20 x 0   Entered and Authorized by:   [name]   Signed by:   [name] on 11/11/2010   Method used:   Electronically to        [facility]  [address] [id number] (retail)       [address]       [place], [place]  [address]       Ph: [phone] or [phone]       Fax: [phone]   RxID:   [id number]    Orders Added: 1)  Est. Patient Level IV [36644] 2)  Prescription Created Electronically [phone]

## 2010-12-23 ENCOUNTER — Encounter: Payer: Self-pay | Admitting: *Deleted

## 2010-12-29 ENCOUNTER — Ambulatory Visit: Payer: Self-pay | Admitting: Internal Medicine

## 2011-01-01 ENCOUNTER — Ambulatory Visit: Payer: Self-pay | Admitting: Internal Medicine

## 2011-01-01 ENCOUNTER — Encounter: Payer: Self-pay | Admitting: Internal Medicine

## 2011-01-04 ENCOUNTER — Ambulatory Visit: Payer: Self-pay | Admitting: Pulmonary Disease

## 2011-01-04 ENCOUNTER — Telehealth: Payer: Self-pay | Admitting: Internal Medicine

## 2011-01-04 ENCOUNTER — Encounter: Payer: Self-pay | Admitting: Internal Medicine

## 2011-01-04 ENCOUNTER — Other Ambulatory Visit (INDEPENDENT_AMBULATORY_CARE_PROVIDER_SITE_OTHER)

## 2011-01-04 ENCOUNTER — Ambulatory Visit (INDEPENDENT_AMBULATORY_CARE_PROVIDER_SITE_OTHER): Admitting: Internal Medicine

## 2011-01-04 DIAGNOSIS — R5383 Other fatigue: Secondary | ICD-10-CM

## 2011-01-04 DIAGNOSIS — I10 Essential (primary) hypertension: Secondary | ICD-10-CM

## 2011-01-04 DIAGNOSIS — E785 Hyperlipidemia, unspecified: Secondary | ICD-10-CM

## 2011-01-04 DIAGNOSIS — I4891 Unspecified atrial fibrillation: Secondary | ICD-10-CM

## 2011-01-04 DIAGNOSIS — R5381 Other malaise: Secondary | ICD-10-CM

## 2011-01-04 DIAGNOSIS — N390 Urinary tract infection, site not specified: Secondary | ICD-10-CM

## 2011-01-04 DIAGNOSIS — B359 Dermatophytosis, unspecified: Secondary | ICD-10-CM

## 2011-01-04 LAB — CBC WITH DIFFERENTIAL/PLATELET
Basophils Absolute: 0 10*3/uL (ref 0.0–0.1)
Basophils Relative: 0.3 % (ref 0.0–3.0)
Eosinophils Absolute: 0.1 10*3/uL (ref 0.0–0.7)
Eosinophils Relative: 1.3 % (ref 0.0–5.0)
HCT: 37.1 % (ref 36.0–46.0)
Hemoglobin: 12.4 g/dL (ref 12.0–15.0)
Lymphocytes Relative: 24.7 % (ref 12.0–46.0)
Lymphs Abs: 1.7 10*3/uL (ref 0.7–4.0)
MCHC: 33.5 g/dL (ref 30.0–36.0)
MCV: 87.5 fl (ref 78.0–100.0)
Monocytes Absolute: 0.6 10*3/uL (ref 0.1–1.0)
Monocytes Relative: 8.9 % (ref 3.0–12.0)
Neutro Abs: 4.5 10*3/uL (ref 1.4–7.7)
Neutrophils Relative %: 64.8 % (ref 43.0–77.0)
Platelets: 227 10*3/uL (ref 150.0–400.0)
RBC: 4.24 Mil/uL (ref 3.87–5.11)
RDW: 14.4 % (ref 11.5–14.6)
WBC: 7 10*3/uL (ref 4.5–10.5)

## 2011-01-04 LAB — URINALYSIS
Bilirubin Urine: NEGATIVE
Hgb urine dipstick: NEGATIVE
Ketones, ur: NEGATIVE
Leukocytes, UA: NEGATIVE
Nitrite: NEGATIVE
Specific Gravity, Urine: 1.015 (ref 1.000–1.030)
Total Protein, Urine: NEGATIVE
Urine Glucose: NEGATIVE
Urobilinogen, UA: 0.2 (ref 0.0–1.0)
pH: 7 (ref 5.0–8.0)

## 2011-01-04 LAB — TSH: TSH: 1.81 u[IU]/mL (ref 0.35–5.50)

## 2011-01-04 MED ORDER — ATENOLOL 25 MG PO TABS: 25.0000 mg | ORAL_TABLET | Freq: Every day | ORAL | Status: DC

## 2011-01-04 MED ORDER — CLOTRIMAZOLE-BETAMETHASONE 1-0.05 % EX CREA: TOPICAL_CREAM | Freq: Two times a day (BID) | CUTANEOUS | Status: DC | PRN

## 2011-01-04 NOTE — Assessment & Plan Note (Signed)
Dysuria - check UA and treat if positive uti

## 2011-01-04 NOTE — Patient Instructions (Signed)
It was good to see you today. Test(s) ordered today. Your results will be called to you after review (48-72hours after test completion). If any changes need to be made, you will be notified at that time. Use Lotrisone cream for rash on foot - Your prescription(s) have been submitted to your pharmacy. Please take as directed and contact our office if you believe you are having problem(s) with the medication(s). Medications reviewed, no other changes at this time. Please schedule followup in 6 months, call sooner if problems.

## 2011-01-04 NOTE — Assessment & Plan Note (Addendum)
Hx paroxysmal AF by report - not verified on hosp EMR review Cath 06/2003 - nonobst CAD, no mention of PAF on hosp records now with intermittent palpitations and "racing" - NSR on exam now Check labs and consider low dose CCB or other rate control med Also check echo, no longer follows with cards (remotely with [facility]ble)

## 2011-01-04 NOTE — Progress Notes (Signed)
Subjective:    Patient ID: Erica [name], female    DOB: [birth year], 55 y.o.   MRN: [id number]  HPI Here for followup -reviewed chronic med issues:  constipation -follows with GI for same continued abd bloating but no pain in abdomen or rectum - very small hard bowel "pellets" and gas, no melena or diarrhea - reports no improvement symptoms with milk of mag or sennakot plus -  OSA - sleep study 11/2009 - ongoing issues with mask fit  And compliance reviewed - seeing pulm for same - family notes less snoring with CPAP -  Pt still complains of fatigue  HTN - reports compliance with ongoing medical treatment and no changes in medication dose or frequency. denies adverse side effects related to current therapy. chronic trace edema, no CP or HA, no weakness or vison changes  dyslipidemia - reports compliance with ongoing medical treatment and no changes in medication dose or frequency. denies adverse side effects related to current therapy.  no GI or muscle c/o  back pain - follos with physiatrist ramos for same - status post steroid shot in back - no radiation down legs or change in nature of pain  depression - reports counselor recently tried generic wellbutrin - increased tearfulness so pt stopped - still on citalopram daily and xanax as needed  - no SI/HI   also complains of palpitations, especially bedtime. "racing like my heart is irregular again" - ?PAF hx, never on anticoagulation per the patient and previously intolerant of medications for heart due to fatigue and depression symptoms (?bbloc)  Past Medical History  Diagnosis Date  . Hypertension   . ALLERGIC RHINITIS   . Sleep apnea     CPAP hs  . PAF (paroxysmal atrial fibrillation)   . Dyslipidemia   . Panic attacks     Hx of depression  . Osteoarthritis   . Obesity     Review of Systems  Constitutional: Positive for fatigue. Negative for fever.  Respiratory: Negative for chest tightness and wheezing.     Cardiovascular: Negative for chest pain and leg swelling.  Musculoskeletal: Positive for back pain.  Neurological: Negative for syncope.      Objective:   Physical Exam BP 110/82  Pulse 75  Temp(Src) 98.4 F (36.9 C) (Oral)  Ht 5\' 8"  (1.727 m)  Wt 239 lb (108.41 kg)  BMI 36.34 kg/m2  Constitutional: She is oriented to person, place, and time. She appears well-developed and well-nourished. No distress.  Eyes: Conjunctivae and EOM are normal. Pupils are equal, round, and reactive to light. No scleral icterus.  Neck: Normal range of motion. Neck supple. No JVD present. No thyromegaly present.  Cardiovascular: Normal rate, regular rhythm and normal heart sounds.  No murmur heard. Pulmonary/Chest: Effort normal and breath sounds normal. No respiratory distress. She has no wheezes.  Abdominal: Soft. Bowel sounds are normal. She exhibits no distension. There is no tenderness.  Neurological: She is alert and oriented to person, place, and time. No cranial nerve deficit. Coordination normal.  Skin: Skin is warm and dry. No rash noted. No erythema. ringworm on dorsum of left foot. Psychiatric: She has a normal mood and affect. Her behavior is normal. Judgment and thought content normal.    Lab Results  Component Value Date   WBC 6.1 06/29/2010   HGB 11.8* 06/29/2010   HGB NEGATIVE 06/29/2010   HCT 35.0* 06/29/2010   PLT 218.0 06/29/2010   CHOL 158 06/29/2010   TRIG 90.0 06/29/2010   HDL  41.00 06/29/2010   ALT 13 06/29/2010   AST 17 06/29/2010   NA 141 06/29/2010   K 4.3 06/29/2010   CL 105 06/29/2010   CREATININE 0.7 06/29/2010   BUN 12 06/29/2010   CO2 29 06/29/2010   TSH 2.29 06/29/2010   HGBA1C 6.2 01/23/2009       Assessment & Plan:  See problem list. Medications and labs reviewed today.

## 2011-01-04 NOTE — Assessment & Plan Note (Signed)
The current medical regimen is effective;  continue present plan and medications. BP Readings from Last 3 Encounters:  01/04/11 110/82  11/11/10 120/82  07/06/10 110/78

## 2011-01-04 NOTE — Telephone Encounter (Signed)
Please call patient - normal lab results. Following medication changes recommended: atenolol 25mg  qd for palpitation and "racing symptoms" - erx done; Also [facility] will call about 2d echo to evaluate heart anatomy (ordered at time of OV) and will call with results once reviewed. Thanks.

## 2011-01-04 NOTE — Assessment & Plan Note (Signed)
The current medical regimen is effective;  continue present plan and medications. Lab Results  Component Value Date   LDLCALC 99 06/29/2010   Lab Results  Component Value Date   ALT 13 06/29/2010   AST 17 06/29/2010   ALKPHOS 130* 06/29/2010   BILITOT 0.2* 06/29/2010

## 2011-01-05 MED ORDER — ATENOLOL 25 MG PO TABS: 25.0000 mg | ORAL_TABLET | Freq: Every day | ORAL | Status: DC

## 2011-01-05 NOTE — Telephone Encounter (Signed)
Notified pt with results and md recommendations. Pt states will need wriiten rx for [facility]ol [facility]. Will print out and leave up front for pick-up......01/05/11@3 :32pm/lmb

## 2011-01-05 NOTE — Telephone Encounter (Signed)
Call pt no ansew LMOM RTC concerning labs.Marland KitchenMarland Kitchen3/27/12@9 :07am/LMB

## 2011-01-18 ENCOUNTER — Other Ambulatory Visit (HOSPITAL_COMMUNITY): Admitting: Radiology

## 2011-01-22 ENCOUNTER — Ambulatory Visit: Admitting: Pulmonary Disease

## 2011-02-02 ENCOUNTER — Ambulatory Visit (HOSPITAL_COMMUNITY): Attending: Internal Medicine | Admitting: Radiology

## 2011-02-02 DIAGNOSIS — E785 Hyperlipidemia, unspecified: Secondary | ICD-10-CM | POA: Insufficient documentation

## 2011-02-02 DIAGNOSIS — R5381 Other malaise: Secondary | ICD-10-CM | POA: Insufficient documentation

## 2011-02-02 DIAGNOSIS — I079 Rheumatic tricuspid valve disease, unspecified: Secondary | ICD-10-CM | POA: Insufficient documentation

## 2011-02-02 DIAGNOSIS — E669 Obesity, unspecified: Secondary | ICD-10-CM | POA: Insufficient documentation

## 2011-02-02 DIAGNOSIS — I10 Essential (primary) hypertension: Secondary | ICD-10-CM | POA: Insufficient documentation

## 2011-02-02 DIAGNOSIS — R5383 Other fatigue: Secondary | ICD-10-CM | POA: Insufficient documentation

## 2011-02-02 DIAGNOSIS — I379 Nonrheumatic pulmonary valve disorder, unspecified: Secondary | ICD-10-CM | POA: Insufficient documentation

## 2011-02-02 DIAGNOSIS — I059 Rheumatic mitral valve disease, unspecified: Secondary | ICD-10-CM | POA: Insufficient documentation

## 2011-02-02 DIAGNOSIS — I4891 Unspecified atrial fibrillation: Secondary | ICD-10-CM

## 2011-02-04 ENCOUNTER — Telehealth: Payer: Self-pay | Admitting: Internal Medicine

## 2011-02-04 DIAGNOSIS — I4891 Unspecified atrial fibrillation: Secondary | ICD-10-CM

## 2011-02-04 MED ORDER — ATENOLOL 25 MG PO TABS: 25.0000 mg | ORAL_TABLET | Freq: Every day | ORAL | Status: DC

## 2011-02-04 NOTE — Telephone Encounter (Signed)
Please call patient - normal Echo results, no abnormality with heart. No medication changes recommended. Thanks.

## 2011-02-04 NOTE — Telephone Encounter (Signed)
Called pt again was told by husband pt not home left msg for her to rtc

## 2011-02-04 NOTE — Telephone Encounter (Signed)
Pt return call back gave results concerning echo. Pt states she lost rx for atenolol requesting rx to be mail to her address

## 2011-02-04 NOTE — Telephone Encounter (Signed)
Called pt no ansew LMOM RTC concerning echo test...02/04/11@12 :21pm/LMB

## 2011-02-11 ENCOUNTER — Ambulatory Visit: Admitting: Pulmonary Disease

## 2011-02-26 NOTE — Op Note (Signed)
[facility]  Patient:    Erica [name], Erica [name]                    MRN: [id number] Proc. Date: 03/14/01 Adm. Date:  [id number] Attending:  Collene Schlichter CC:         Harl Bowie, M.D.   Operative Report  PREOPERATIVE DIAGNOSES: 1. Uterine fibroids with abnormal uterine bleeding. 2. Anemia secondary to above.  POSTOPERATIVE DIAGNOSES: 1. Uterine fibroids with abnormal uterine bleeding. 2. Anemia secondary to above. 3. Right hydrosalpinx.  OPERATION: 1. Abdominal supracervical hysterectomy. 2. Right salpingo-oophorectomy.  SURGEON:  Almedia Balls. Randell Patient, M.D.  ASSISTANT:  Harl Bowie, M.D.  ANESTHESIA:   General orotracheal.  INDICATIONS:  The patient is a 55 year old with persisting abnormal bleeding and uterine fibroid for hysterectomy.  She has been fully counselled as to the nature of the procedure and the risks involved to include risks of anesthesia, injury to bowel, bladder, blood vessels, ureters, postoperative hemorrhage, infection, recuperation, and possible use of hormone replacement should her ovaries be removed.  She fully understands all of these considerations and wishes to proceed on 14 March 2001.  DESCRIPTION OF PROCEDURE:  On entry into the abdominal cavity, exploration of the upper abdomen revealed lower liver edge, spleen, kidneys, periaortic areas, and appendix to be normal.  Uterus was enlarged to approximately 10 to 12-week gestational size with soft areas as well as some obvious myomatous areas as well.  There were a number of supernumerary blood vessels laterally thought to be secondary to the patients previous uterine artery embolization procedure.  Right ovary was somewhat cystic, and the right tube had a hydrosalpinx present throughout the length of the tube.  The left tube and ovary were normal except that the left ovary had several small simple cysts.  DESCRIPTION OF PROCEDURE:     With the patient under general  anesthesia, prepared and draped in the usual sterile fashion, and with Foley catheter in the bladder, a lower abdominal transverse incision was made and carried into the perineal cavity without difficulty.  Self-retaining retractor was placed, and the bowel was packed off.  Kelly clamps were used at the uteroovarian anastomosis, tubes, and round ligaments bilaterally for traction and hemostasis.  Round ligaments were ligated with suture ligatures with opening of the retroperitoneal space.  The left tube and ovary were normal, and it was felt that they should be conserved.  Accordingly, Heaney clamp was placed proximal to the left ovary, and an incision was made here with the pedicle being doubly ligated with one chromic catgut.  The bladder flap had been developed on the anterior surface of the uterus and lower uterine segment prior to this portion of the procedure.  Because of the hydrosalpinx on the right and multiple vessels in this area, the right tube and ovary were removed by placing a clamp at the infundibulopelvic ligament, removing the tube and ovary and doubly ligating the ligament.  Uterine vessels were then skeletonized bilaterally, clamped, cut, and suture ligated with #1 chromic catgut.  Heaney clamps were used to clamp the cardinal ligaments bilaterally which were then cut free and suture ligated with #1 chronic catgut.  It was then possible, because of advancement of the bladder to remove the uterine fundus by Bovie electrocoagulation.  The cervix was left with small bleeders rendered hemostatic with Bovie electrocoagulation.  Interrupted figure-of-eight sutures were placed at the lateral angles of the cervix as well as in the midline for reapproximation  of the cervix and hemostasis.  The area was reperitonealized with interrupted sutures of #1 chromic catgut.  The area was lavaged with copious amounts of lactated Ringers solution.  After noting that hemostasis was  maintained and that sponge and instrument counts were correct, the peritoneum was closed with a continuous suture of #0 Vicryl. Fascia was closed with two sutures of #0 Vicryl which were brought from the lateral aspects of the incision and tied separately in the midline. Subcutaneous fat was closed with interrupted sutures of #1 chromic catgut. Each layer was lavaged with copious amounts of lactated Ringers prior to full closure.  Skin was closed with a subcuticular suture of 3-0 plain catgut. Estimated blood loss 300 ml.  The patient was taken to the recovery room in good condition with clear urine in the Foley catheter tubing. She will be placed on 23-hour observation following surgery. DD:  03/14/01 TD:  03/14/01 Job: [id number]

## 2011-02-26 NOTE — Discharge Summary (Signed)
[facility]  Patient:    GRACI, HULCE                    MRN: [id number] Adm. Date:  [id number] Disc. Date: [id number] Attending:  Collene Schlichter                           Discharge Summary  HISTORY OF PRESENT ILLNESS:  The patient is a 55 year old, status post uterine artery embolization for fibroids with persistent uterine enlargement, abnormal uterine bleeding for hysterectomy on March 13, 2001.  The remainder of her History and Physical are as previously dictated.  LABORATORY DATA AND X-RAY FINDINGS:  Preoperative hemoglobin 12.9. Electrocardiogram within normal limits.  HOSPITAL COURSE:  The patient was taken to the operating room on June 4,2002, at which time abdominal supracervical hysterectomy, right salpingo-oophorectomy because of right hydrosalpinx and ovarian cyst were performed.  The patient did well postoperatively.  Diet and ambulation were progressed over the evening of June 4, and the early morning of June 5.  On the morning of June 5, she was afebrile and experiencing no problems and it was felt that she could be discharged at this time.  DISCHARGE DIAGNOSES: 1. Uterine enlargement, question fibroids. 2. Abnormal uterine bleeding. 3. Right hydrosalpinx. 4. Bilateral ovarian cysts.  PROCEDURE:  Abdominal supracervical hysterectomy with right salpingo-oophorectomy and destruction of left ovarian cyst.  Pathology report is unavailable at the time of dictation.  DISPOSITION:  Discharged home to return to the office in two weeks for followup.  She was fully ambulatory.  DIET:  Regular.  CONDITION ON DISCHARGE:  Good.  ACTIVITY:  She was instructed to gradually progress her activities at home and limit lifting and driving for two weeks.  DISCHARGE MEDICATIONS: 1. Mepergan Fortis or generic, #30, to be taken one or two q.4h. p.r.n. pain. 2. Doxycycline 100 mg #12 to be taken one b.i.d.  SPECIAL INSTRUCTIONS:  She will call  for any problems. DD:  03/15/01 TD:  03/15/01 Job: [id number]

## 2011-02-26 NOTE — H&P (Signed)
NAMEMAAYAN, JENNING             ACCOUNT NO.:  [id number]   MEDICAL RECORD NO.:  [id number]          PATIENT TYPE:  INP   LOCATION:  NA                           FACILITY:  [facility]   PHYSICIAN:  Ollen Gross, M.D.    DATE OF BIRTH:  1956-06-02   DATE OF ADMISSION:  DATE OF DISCHARGE:                                HISTORY & PHYSICAL   DATE OF OFFICE VISIT/HISTORY AND PHYSICAL:  May 20, 2005.   DATE OF ADMISSION:  June 02, 2005.   CHIEF COMPLAINT:  Right knee pain.   HISTORY OF PRESENT ILLNESS:  The patient is a 55 year old female seen by Dr.  Lequita Halt for ongoing right knee pain.  The pain has been progressive for  quite some time now.  She has been treated conservatively in the past for  her knee arthritis.  She has done cortisone in the past which has only  provided temporary relief.  She is at a point now where she would like to  consider having something done about it.  It is interfering with what she  can and can not do.  It is felt the most predictable  means of improvement  of her function and her pain is knee replacement.  Risks and benefits have  been discussed and she has elected to proceed with surgery.   ALLERGIES:  1.  CODEINE.  2.  HYDROCODONE.   CURRENT MEDICATIONS:  1.  Alprazolam 0.5 mg.  2.  Furosemide 20 mg.  3.  Xanax XR 1 mg.  4.  Climara 0.075 mg patch.  5.  Klor-Con 10 mEq.  6.  Aspirin 81 mg.  7.  Zocor.  8.  Claritin.  9.  Cyclobenzaprine.  10. B-150.  11. Calcium.   PAST MEDICAL HISTORY:  1.  Panic disorder.  2.  Hypercholesterolemia.  3.  Borderline hypertension.  4.  Heart murmur.  5.  Arthritis.  6.  Post menopausal.   PAST SURGICAL HISTORY:  1.  Hysterectomy in June 2001.      1.  Prior to that she had uterine surgery for fibroids in 1991.  2.  Arthroscopic right knee surgery x2.   SOCIAL HISTORY:  Married, works as a Architectural technologist, nonsmoker, no  alcohol, has three children.  Husband will be assisting with care  after  surgery.   FAMILY HISTORY:  Mother living at 17 with an enlarged heart.  Father living,  age 11.  Family history of arthritis, heart disease, hypertension, diabetes,  and breast cancer.   REVIEW OF SYSTEMS:  GENERAL:  No fever, night sweats.  NEURO:  History of  panic disorder.  No seizures, syncope, paralysis.  RESPIRATORY:  No  shortness of breath, productive cough, or hemoptysis.  CARDIOVASCULAR:  No  chest pain, angina, orthopnea.  GI:  No nausea, vomiting, diarrhea, or  constipation.  GU:  No dysuria, hematuria, discharge.  MUSCULOSKELETAL:  Right knee history of present illness.   PHYSICAL EXAMINATION:  VITAL SIGNS:  Pulse 74, respirations 12, blood  pressure 105/67.  GENERAL:  A 55 year old, African American female, well nourished, well  developed, slightly overweight,  no acute distress, alert, oriented, and  cooperative.  HEENT:  Normocephalic, atraumatic.  Pupils round and reactive.  EOMs intact.  NECK:  Supple.  CHEST:  Clear.  HEART:  Regular rate and rhythm.  No murmurs.  ABDOMEN:  Soft, round, protuberant abdomen.  Bowel sounds present.  RECTAL/BREASTS/GENITALIA:  Not done.  Not pertinent to present illness.  EXTREMITIES:  Right knee:  The right knee does show a slight varus  malalignment deformity.  Moderate crepitus is noted.  Very tender medially.  No instability.   IMPRESSION:  1.  Osteoarthritis, right knee.  2.  History of panic disorder.  3.  Hypercholesterolemia.  4.  Borderline hypertension.  5.  Heart murmur.  6.  Post menopausal.   PLAN:  The patient will be admitted to [facility] to undergo a  right total knee arthroplasty.  The surgery will be performed by Dr. Ollen Gross.  Her medical doctor is Dr. Tawanna Cooler.  Her cardiologist is Dr. Elsie Lincoln,  both will be notified of the room number on admission and will be consulted  if needed for medical assistance with the patient throughout the hospital  course.      Alexzandrew L. Julien Girt,  P.A.      Ollen Gross, M.D.  Electronically Signed    ALP/MEDQ  D:  06/01/2005  T:  06/01/2005  Job:  [id number]   cc:   Tinnie Gens A. Tawanna Cooler, M.D. [facility]  [address] [place]  [place] [address]   Madaline Savage, M.D.  [phone] [address]  [place]  [place] [address]  Fax: [phone]   Ollen Gross, M.D.  [facility]  [address]  [address]  [place]  [place] [address]  Fax: [phone]

## 2011-02-26 NOTE — H&P (Signed)
[facility]  Patient:    Erica [name], Erica [name]                    MRN: [id number] Adm. Date:  [id number] Attending:  Collene Schlichter                         History and Physical  CHIEF COMPLAINT:  Abnormal bleeding, pain, fibroids.  HISTORY:  The patient is a 55 year old, gravida 3, para 3, whose last menstrual period was Feb 09, 2001.  She has been followed in our office since February 2001, with complaints of heavy bleeding and pain.  She underwent fibroid embolization in June 2000, with continued heavy bleeding and no change in the size of her fibroids.  She underwent saline sonogram in January 2002, showing multiple myomata, and hysteroscopy D&C in late January 2002, with benign changes showing benign endometrial polyp and fragments consistent with leiomyomata.  She has continued to have abnormal bleeding and pain.  She is admitted at this time for abdominal hysterectomy, possible bilateral salpingo-oophorectomy.  This procedure has been discussed with her fully to include the procedure in full, risks of anesthesia, injury to bowel, bladder, blood vessels, ureters postoperative, hemorrhage, infection, recuperation, and possible hormone replacement should her ovaries be removed.  She fully understands all of these considerations and wishes to proceed on March 16, 2001. A Pap smear was normal in September 2001.  PAST MEDICAL HISTORY:  Fibroid embolization in June 2000.  ALLERGIES:  She is allergic to no medications except to [facility].  MEDICATIONS:  She is taking supplemental iron for history of lower hemoglobin secondary to heavy bleeding and occasional Ativan for panic attacks.  FAMILY HISTORY:  Mother with diabetes and hypertension.  SOCIAL HISTORY:  The patient is a nonsmoker, nondrinker.  REVIEW OF SYSTEMS:  HEENT:  Negative.  CARDIORESPIRATORY:  Negative. GASTROINTESTINAL:  Chronic constipation.  GENITOURINARY:  As in present illness.   NEUROMUSCULAR:  Negative.  PHYSICAL EXAMINATION:  VITAL SIGNS:  Height 5 feet 6 inches, weight 235 pounds, blood pressure 126/74, pulse 84, respirations 18.  GENERAL:  Well-developed black female in no acute distress.  HEENT:  Within normal limits.  NECK:  Supple without masses, adenopathy, or bruits.  HEART:  Regular rate and rhythm without murmurs.  LUNGS:  Clear to P&A.  BREASTS:  Sitting and lying, no mass.  Axilla negative.  ABDOMEN:  Flat and soft without mass except for mass effect in the lower midline with an irregular mass extending almost to the umbilicus.  PELVIC:  External genitalia, Bartholins, urethra and Skenes glands within normal limits.  Vagina is clean.  Cervix slightly inflamed.  Uterus is mid position, approximately 16+ weeks size.  It is tender as well.  There are no palpable adnexal masses.  Anterior and posterior cul-de-sac exam is confirmatory.  EXTREMITIES:  Within normal limits.  CENTRAL NERVOUS SYSTEM:  Grossly intact.  SKIN:  Without suspicious lesions.  IMPRESSION:  Abnormal uterine bleeding, pelvic pain, uterine enlargement, history of anemia secondary to above.  DISPOSITION:  As noted above. DD:  03/09/01 TD:  03/09/01 Job: [id number]
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
## 2011-02-26 NOTE — Cardiovascular Report (Signed)
   NAME:  Erica [name], Erica [name]                       ACCOUNT NO.:  [id number]   MEDICAL RECORD NO.:  [id number]                   PATIENT TYPE:  OIB   LOCATION:  2859                                 FACILITY:  [facility]   PHYSICIAN:  Madaline Savage, M.D.             DATE OF BIRTH:  13-Jul-1956   DATE OF PROCEDURE:  07/04/2003  DATE OF DISCHARGE:                              CARDIAC CATHETERIZATION   PROCEDURES PERFORMED:  1. Combined left heart catheterization consisting of elective coronary     angiography by Judkins technique, retrograde left heart catheterization,     left ventriculography.  2. AngioSeal arteriotomy successful and uncomplicated.   ENTRY SITE:  Right femoral.   DYE USED:  Omnipaque.   CATHETERS USED:  6-French diagnostic catheters.   MEDICATIONS GIVEN:  Versed 1 mg IV.   COMPLICATIONS:  None.   PATIENT PROFILE:  The patient is a 55 year old obese African-American female  with an anxiety disorder with panic attacks, awareness of palpitations, runs  of premature ventricular beats, and a Cardiolite stress test performed  May 29, 2003 showing a left ventricular ejection fraction of 64% with  ischemia in the mid ventricle.  Today's outpatient procedure was performed  electively without complications.   RESULTS:  The left ventricular pressure was 150/8, end-diastolic pressure  22.  Central aortic pressure 150/70, mean 115.  No aortic valve gradient by  pullback technique.   ANGIOGRAPHIC RESULTS:  1. The coronary arteries were normal.  2. The left circumflex is dominant.  3. The right coronary artery did give rise to a posterior descending branch     that was rather small.  All vessels were angiographically patent.  4. Left ventricle showed normal contractility, no wall motion abnormalities,     ejection fraction estimate 65%.   A right percutaneous femoral AngioSeal arteriotomy was performed after  documenting good catheter position well above the bifurcation  in the femoral  with excellent results and no complications.    FINAL DIAGNOSES:  1. Angiographically patent coronary arteries.  2. Normal left ventricular systolic function.  3. Successful AngioSeal arteriotomy.                                               Madaline Savage, M.D.    WHG/MEDQ  D:  07/04/2003  T:  07/04/2003  Job:  [id number]   cc:   Leanne Chang, M.D.  [address]  [place]  [place] [address]  Fax: [phone]   Cath Lab

## 2011-02-26 NOTE — Op Note (Signed)
Erica [name], Erica [name]                       ACCOUNT NO.:  [id number]   MEDICAL RECORD NO.:  [id number]                   PATIENT TYPE:  AMB   LOCATION:  DSC                                  FACILITY:  [facility]   PHYSICIAN:  Harvie Junior, M.D.                DATE OF BIRTH:  1956-03-09   DATE OF PROCEDURE:  04/17/2003  DATE OF DISCHARGE:                                 OPERATIVE REPORT   PREOPERATIVE DIAGNOSIS:  Medial meniscal tear.   POSTOPERATIVE DIAGNOSES:  1. Medial meniscal tear.  2. 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   PROCEDURES:  1. Partial posterior  horn medial meniscectomy.  2. Patellofemoral chondroplasty of the medial trochlea.   SURGEON:  Harvie Junior, M.D.   ASSISTANT:  None.   ANESTHESIA:  General.   BRIEF HISTORY:  The patient is a 55 year old female with a long history of  having right knee pain. She ultimately had undergone conservative care  including anti-inflammatory medication, activity modification. Pending none  of this working she is also taken to the operating room for operative knee  arthroscopy. An MRI preoperatively showed medial meniscal tear.   DESCRIPTION OF PROCEDURE:  The patient was brought to the operating room and  after adequate anesthesia was obtained, the patient was placed on the  operating table. The right leg was prepped and draped in the usual sterile  fashion. Exam under anesthesia was stable in all directions.   Routine arthroscopic examination of the knee at this point revealed  that  there was an obvious posterior  horn medial meniscal tear. This was debrided  to a smooth and stable rim with the straight  biting forceps and the  upbiting forceps. The remaining meniscal rim was contoured down with  the  suction shaver. No significant chondromalacia on the medial femoral condyle  was identified.   Attention was turned to the ACL which was normal, lateral side normal.  Attention was turned back  up into the patellofemoral joint. There was noted  to be  on the medial aspect of the trochlea some grade 2 and grade 3  changes. This was debrided to a smooth and stable rim. There was a plica  that could have been corresponding. This was debrided to the rim.   At this point a final check was made for any loose and fragmenting pieces of  meniscus. A final check was made medially. It was probed and felt to be  stable.   At this point the patient was taken to the recovery room. She was noted to  be in satisfactory condition. Estimated blood loss was for the procedure was  none.  Harvie Junior, M.D.    Ranae Plumber  D:  04/17/2003  T:  04/17/2003  Job:  [id number]
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
## 2011-03-15 ENCOUNTER — Ambulatory Visit (INDEPENDENT_AMBULATORY_CARE_PROVIDER_SITE_OTHER): Admitting: Internal Medicine

## 2011-03-15 ENCOUNTER — Encounter: Payer: Self-pay | Admitting: Internal Medicine

## 2011-03-15 DIAGNOSIS — K219 Gastro-esophageal reflux disease without esophagitis: Secondary | ICD-10-CM

## 2011-03-15 DIAGNOSIS — B351 Tinea unguium: Secondary | ICD-10-CM

## 2011-03-15 MED ORDER — CICLOPIROX 8 % EX SOLN: Freq: Every day | CUTANEOUS | Status: DC

## 2011-03-15 MED ORDER — OMEPRAZOLE 20 MG PO CPDR: 20.0000 mg | DELAYED_RELEASE_CAPSULE | Freq: Every day | ORAL | Status: DC

## 2011-03-15 NOTE — Patient Instructions (Signed)
It was good to see you today. Use PenLac polish for nail fungus and omeprazole for reflux symptoms - Your prescription(s) have been given to you to submit to your pharmacy. Please take as directed and contact our office if you believe you are having problem(s) with the medication(s).

## 2011-03-15 NOTE — Progress Notes (Signed)
  Subjective:    Patient ID: Erica [name], female    DOB: [birth year], 55 y.o.   MRN: [id number]  HPI  complains of nail fungus Affects R middle and R great toe nails Denies precipitating trauma or but frequent pedicure/manicure treatments  Also complains of GERD symptoms -  Worse at night - describes "burning" in throat as "indigestion"  Also reviewed chronic medical issues: Constipation, chronic  -follows with GI for same  continued abd bloating but no pain in abdomen or rectum -  very small hard bowel "pellets" and gas, no melena or diarrhea -  reports no improvement symptoms with milk of mag or sennakot plus -   OSA - sleep study 11/2009 -working with pulm on same - family notes less snoring with CPAP - Pt still complains of fatigue   HTN - reports compliance with ongoing medical treatment and no changes in medication dose or frequency. denies adverse side effects related to current therapy. chronic trace edema, no CP or HA, no weakness or vison changes   dyslipidemia - reports compliance with ongoing medical treatment and no changes in medication dose or frequency. denies adverse side effects related to current therapy. no GI or muscle c/o   back pain - follows with physiatrist ramos for same - status post steroid shot in back - no radiation down legs or change in nature of pain  depression - reports counselor recently tried generic wellbutrin - increased tearfulness so pt stopped - still on citalopram daily and xanax as needed - no SI/HI    Past Medical History  Diagnosis Date  . Hypertension   . ALLERGIC RHINITIS   . Sleep apnea     CPAP hs  . PAF (paroxysmal atrial fibrillation)   . Dyslipidemia   . Panic attacks     Hx of depression  . Osteoarthritis   . Obesity      Review of Systems  Constitutional: Negative for fever.  Cardiovascular: Negative for chest pain.  Gastrointestinal: Negative for nausea, vomiting and abdominal pain.       Burning reflux pain  especially qhs       Objective:   Physical Exam BP 138/82  Pulse 83  Temp(Src) 97.8 F (36.6 C) (Oral)  Resp 16  Wt 243 lb (110.224 kg)  SpO2 97% Physical Exam  Constitutional: She is oriented to person, place, and time. She appears well-developed and well-nourished. No distress.  Neck: Normal range of motion. Neck supple. No JVD present. No thyromegaly present.  Cardiovascular: Normal rate, regular rhythm and normal heart sounds.  No murmur heard. No BLE edema. Pulmonary/Chest: Effort normal and breath sounds normal. No respiratory distress. She has no wheezes.   Skin: Onchomychosis R great toenail and R middle finger with evidence prior trauma. Skin is warm and dry. No rash noted. No erythema.  Psychiatric: She has a normal mood and affect. Her behavior is normal. Judgment and thought content normal.        Assessment & Plan:  Nail fungus - PenLac rather than oral meds - erx done Advised on need to avoid commerical pedicures for optimal nail health  GERD - 30d PPI rx'd - new erx done - to follow up GI as ongoing if unimproved

## 2011-05-27 ENCOUNTER — Other Ambulatory Visit: Payer: Self-pay | Admitting: Obstetrics & Gynecology

## 2011-05-27 ENCOUNTER — Other Ambulatory Visit: Payer: Self-pay | Admitting: Internal Medicine

## 2011-05-27 DIAGNOSIS — Z1231 Encounter for screening mammogram for malignant neoplasm of breast: Secondary | ICD-10-CM

## 2011-06-02 ENCOUNTER — Encounter: Payer: Self-pay | Admitting: Internal Medicine

## 2011-06-02 ENCOUNTER — Ambulatory Visit (INDEPENDENT_AMBULATORY_CARE_PROVIDER_SITE_OTHER): Admitting: Internal Medicine

## 2011-06-02 VITALS — BP 112/60 | HR 78 | Temp 99.3°F | Ht 66.0 in | Wt 240.0 lb

## 2011-06-02 DIAGNOSIS — B351 Tinea unguium: Secondary | ICD-10-CM

## 2011-06-02 DIAGNOSIS — H9190 Unspecified hearing loss, unspecified ear: Secondary | ICD-10-CM

## 2011-06-02 DIAGNOSIS — H9193 Unspecified hearing loss, bilateral: Secondary | ICD-10-CM

## 2011-06-02 NOTE — Patient Instructions (Signed)
It was good to see you today. we'll make referral to audiology for hearing test. Our office will contact you regarding appointment(s) once made. Medications reviewed, no changes at this time. Keep nail covered to avoid accidental injury until it "falls off" - call us if problems

## 2011-06-02 NOTE — Progress Notes (Signed)
  Subjective:    Patient ID: Erica [name], female    DOB: 1955/10/26, 55 y.o.   MRN: [id number]  HPI  complains of decreased hearing B, subacute last few months,  no ear pain, trauma or drainage   Past Medical History  Diagnosis Date  . Hypertension   . ALLERGIC RHINITIS   . Sleep apnea     CPAP hs  . PAF (paroxysmal atrial fibrillation)   . Dyslipidemia   . Panic attacks     Hx of depression  . Osteoarthritis   . Obesity     Review of Systems  Respiratory: Negative for shortness of breath.   Cardiovascular: Negative for chest pain.  Neurological: Negative for weakness and headaches.       Objective:   Physical Exam BP 112/60  Pulse 78  Temp(Src) 99.3 F (37.4 C) (Oral)  Ht 5\' 6"  (1.676 m)  Wt 240 lb (108.863 kg)  BMI 38.74 kg/m2  SpO2 96% Wt Readings from Last 3 Encounters:  06/02/11 240 lb (108.863 kg)  03/15/11 243 lb (110.224 kg)  01/04/11 239 lb (108.41 kg)   Constitutional: She is overweight but appears well-developed and well-nourished. No distress.  HENT: Head: Normocephalic and atraumatic. Ears: B TMs ok, no erythema or effusion; Nose: Nose normal.  Mouth/Throat: Oropharynx is clear and moist. No oropharyngeal exudate.  Eyes: Conjunctivae and EOM are normal. Pupils are equal, round, and reactive to light. No scleral icterus.  Neck: Normal range of motion. Neck supple. No JVD present. No thyromegaly present.  Cardiovascular: Normal rate, regular rhythm and normal heart sounds.  No murmur heard. chronic 1+ ankle BLE edema. Pulmonary/Chest: Effort normal and breath sounds normal. No respiratory distress. She has no wheezes.  Skin: Skin is warm and dry. No rash noted. No erythema. loosening of R middle fingernail from fungus Psychiatric: She has a normal mood and affect. Her behavior is normal. Judgment and thought content normal.   Lab Results  Component Value Date   WBC 7.0 01/04/2011   HGB 12.4 01/04/2011   HCT 37.1 01/04/2011   PLT 227.0 01/04/2011    CHOL 158 06/29/2010   TRIG 90.0 06/29/2010   HDL 41.00 06/29/2010   ALT 13 06/29/2010   AST 17 06/29/2010   NA 141 06/29/2010   K 4.3 06/29/2010   CL 105 06/29/2010   CREATININE 0.7 06/29/2010   BUN 12 06/29/2010   CO2 29 06/29/2010   TSH 1.81 01/04/2011   HGBA1C 6.2 01/23/2009      Assessment & Plan:   Decreased hearing - no apparent exam abnormality to explain - refer to audiology for further testing  onchomycosis - reviewed natural hx, reassurance provided

## 2011-06-04 ENCOUNTER — Ambulatory Visit
Admission: RE | Admit: 2011-06-04 | Discharge: 2011-06-04 | Disposition: A | Discharge status: Home / Self Care | Source: Ambulatory Visit | Attending: Obstetrics & Gynecology | Admitting: Obstetrics & Gynecology

## 2011-06-04 DIAGNOSIS — Z1231 Encounter for screening mammogram for malignant neoplasm of breast: Secondary | ICD-10-CM

## 2011-06-07 ENCOUNTER — Telehealth: Payer: Self-pay | Admitting: *Deleted

## 2011-06-07 NOTE — Telephone Encounter (Signed)
Called Delaney Meigs back left msg on her vm md response.Marland KitchenMarland Kitchen8/27/12@9 :59am/LMB

## 2011-06-07 NOTE — Telephone Encounter (Signed)
Yes - ok to try - thanks

## 2011-06-07 NOTE — Telephone Encounter (Signed)
Received call from pt gyn dr wanting to start pt on Clonidine for hot flashes. Want to know from md will this be ok? Pls advise.Marland KitchenMarland Kitchen8/27/12@9 :13am/LMB

## 2011-07-02 ENCOUNTER — Emergency Department (HOSPITAL_COMMUNITY)
Admission: EM | Admit: 2011-07-02 | Discharge: 2011-07-03 | Disposition: A | Discharge status: Home / Self Care | Attending: Emergency Medicine | Admitting: Emergency Medicine

## 2011-07-02 ENCOUNTER — Inpatient Hospital Stay (INDEPENDENT_AMBULATORY_CARE_PROVIDER_SITE_OTHER)
Admission: RE | Admit: 2011-07-02 | Discharge: 2011-07-02 | Disposition: A | Discharge status: Home / Self Care | Source: Ambulatory Visit | Attending: Family Medicine | Admitting: Family Medicine

## 2011-07-02 ENCOUNTER — Emergency Department (HOSPITAL_COMMUNITY)

## 2011-07-02 DIAGNOSIS — R002 Palpitations: Secondary | ICD-10-CM

## 2011-07-02 DIAGNOSIS — I10 Essential (primary) hypertension: Secondary | ICD-10-CM | POA: Insufficient documentation

## 2011-07-02 DIAGNOSIS — Z79899 Other long term (current) drug therapy: Secondary | ICD-10-CM | POA: Insufficient documentation

## 2011-07-02 DIAGNOSIS — E785 Hyperlipidemia, unspecified: Secondary | ICD-10-CM | POA: Insufficient documentation

## 2011-07-02 DIAGNOSIS — G8929 Other chronic pain: Secondary | ICD-10-CM | POA: Insufficient documentation

## 2011-07-02 DIAGNOSIS — R51 Headache: Secondary | ICD-10-CM

## 2011-07-02 DIAGNOSIS — M549 Dorsalgia, unspecified: Secondary | ICD-10-CM | POA: Insufficient documentation

## 2011-07-02 LAB — URINE MICROSCOPIC-ADD ON

## 2011-07-02 LAB — URINALYSIS, ROUTINE W REFLEX MICROSCOPIC
Glucose, UA: NEGATIVE mg/dL
Hgb urine dipstick: NEGATIVE
Ketones, ur: NEGATIVE mg/dL
Nitrite: NEGATIVE
Protein, ur: NEGATIVE mg/dL
Specific Gravity, Urine: 1.025 (ref 1.005–1.030)
Urobilinogen, UA: 1 mg/dL (ref 0.0–1.0)
pH: 6 (ref 5.0–8.0)

## 2011-07-02 LAB — POCT I-STAT, CHEM 8
BUN: 17 mg/dL (ref 6–23)
Calcium, Ion: 1.13 mmol/L (ref 1.12–1.32)
Chloride: 103 mEq/L (ref 96–112)
Creatinine, Ser: 0.9 mg/dL (ref 0.50–1.10)
Glucose, Bld: 102 mg/dL — ABNORMAL HIGH (ref 70–99)
HCT: 40 % (ref 36.0–46.0)
Hemoglobin: 13.6 g/dL (ref 12.0–15.0)
Potassium: 3.8 mEq/L (ref 3.5–5.1)
Sodium: 141 mEq/L (ref 135–145)
TCO2: 27 mmol/L (ref 0–100)

## 2011-07-04 LAB — URINE CULTURE
Colony Count: 85000
Culture  Setup Time: 201209221101

## 2011-07-07 ENCOUNTER — Ambulatory Visit: Admitting: Internal Medicine

## 2011-07-12 ENCOUNTER — Ambulatory Visit (INDEPENDENT_AMBULATORY_CARE_PROVIDER_SITE_OTHER): Admitting: Internal Medicine

## 2011-07-12 ENCOUNTER — Other Ambulatory Visit (INDEPENDENT_AMBULATORY_CARE_PROVIDER_SITE_OTHER)

## 2011-07-12 ENCOUNTER — Encounter: Payer: Self-pay | Admitting: Internal Medicine

## 2011-07-12 DIAGNOSIS — E785 Hyperlipidemia, unspecified: Secondary | ICD-10-CM

## 2011-07-12 DIAGNOSIS — I10 Essential (primary) hypertension: Secondary | ICD-10-CM

## 2011-07-12 DIAGNOSIS — Z79899 Other long term (current) drug therapy: Secondary | ICD-10-CM

## 2011-07-12 LAB — HEPATIC FUNCTION PANEL
ALT: 12 U/L (ref 0–35)
AST: 15 U/L (ref 0–37)
Albumin: 3.4 g/dL — ABNORMAL LOW (ref 3.5–5.2)
Alkaline Phosphatase: 153 U/L — ABNORMAL HIGH (ref 39–117)
Bilirubin, Direct: 0 mg/dL (ref 0.0–0.3)
Total Bilirubin: 0.3 mg/dL (ref 0.3–1.2)
Total Protein: 7 g/dL (ref 6.0–8.3)

## 2011-07-12 LAB — LIPID PANEL
Cholesterol: 167 mg/dL (ref 0–200)
HDL: 46.6 mg/dL (ref >39.00)
LDL Cholesterol: 104 mg/dL — ABNORMAL HIGH (ref 0–99)
Total CHOL/HDL Ratio: 4
Triglycerides: 83 mg/dL (ref 0.0–149.0)
VLDL: 16.6 mg/dL (ref 0.0–40.0)

## 2011-07-12 MED ORDER — FEXOFENADINE HCL 180 MG PO TABS: 180.0000 mg | ORAL_TABLET | Freq: Every day | ORAL | Status: DC

## 2011-07-12 NOTE — Assessment & Plan Note (Signed)
The current medical regimen is effective (simva, fish oil);  continue present plan and medications. Check labs annually Lab Results  Component Value Date   LDLCALC 99 06/29/2010   Lab Results  Component Value Date   ALT 13 06/29/2010   AST 17 06/29/2010   ALKPHOS 130* 06/29/2010   BILITOT 0.2* 06/29/2010

## 2011-07-12 NOTE — Progress Notes (Signed)
  Subjective:    Patient ID: Erica [name], female    DOB: [birth year], 55 y.o.   MRN: [id number]  HPI here for follow up - reviewed chronic medical issues:  GERD symptoms -  Worse at night - describes "burning" in throat as "indigestion" Improved with PPI  Constipation, chronic  -follows with GI for same  continued abd bloating but no pain in abdomen or rectum -  very small hard bowel "pellets" and gas, no melena or diarrhea -  reports no improvement symptoms with milk of mag or sennakot plus -   OSA - sleep study 11/2009 -working with pulm on same - family notes less snoring with CPAP - Pt still complains of fatigue   HTN - reports compliance with ongoing medical treatment and no changes in medication dose or frequency. denies adverse side effects related to current therapy. chronic trace edema, no CP or HA, no weakness or vison changes   dyslipidemia - reports compliance with ongoing medical treatment and no changes in medication dose or frequency. denies adverse side effects related to current therapy. no GI or muscle pains   back pain - follows with physiatrist ramos for same - periodic steroid shots in back - no radiation down legs or change in nature of pain -  depression - reports counselor prev tried generic wellbutrin - increased tearfulness so pt stopped -  still on citalopram daily and xanax as needed - no SI/HI    Past Medical History  Diagnosis Date  . Hypertension   . ALLERGIC RHINITIS   . Sleep apnea     CPAP hs  . PAF (paroxysmal atrial fibrillation)   . Dyslipidemia   . Panic attacks     Hx of depression  . Osteoarthritis   . Obesity      Review of Systems  Constitutional: Positive for fatigue. Negative for fever and unexpected weight change.  Cardiovascular: Negative for chest pain and leg swelling.  Gastrointestinal: Negative for nausea, vomiting and abdominal pain.       Objective:   Physical Exam  BP 122/82  Pulse 67  Temp(Src) 98.8 F  (37.1 C) (Oral)  Ht 5\' 6"  (1.676 m)  Wt 241 lb 9.6 oz (109.589 kg)  BMI 39.00 kg/m2  SpO2 95%  Constitutional: She is obese. She appears well-developed and well-nourished. No distress.  Neck: Normal range of motion. Neck supple. No JVD present. No thyromegaly present.  Cardiovascular: Normal rate, regular rhythm and normal heart sounds.  No murmur heard. No BLE edema. Pulmonary/Chest: Effort normal and breath sounds normal. No respiratory distress. She has no wheezes.  Psychiatric: She has a normal mood and affect. Her behavior is normal. Judgment and thought content normal.   . Lab Results  Component Value Date   WBC 7.0 01/04/2011   HGB 13.6 07/02/2011   HCT 40.0 07/02/2011   PLT 227.0 01/04/2011   CHOL 158 06/29/2010   TRIG 90.0 06/29/2010   HDL 41.00 06/29/2010   ALT 13 06/29/2010   AST 17 06/29/2010   NA 141 07/02/2011   K 3.8 07/02/2011   CL 103 07/02/2011   CREATININE 0.90 07/02/2011   BUN 17 07/02/2011   CO2 29 06/29/2010   TSH 1.81 01/04/2011   HGBA1C 6.2 01/23/2009        Assessment & Plan:   See problem list. Medications and labs reviewed today.

## 2011-07-12 NOTE — Assessment & Plan Note (Signed)
The current medical regimen is effective;  continue present plan and medications. BP Readings from Last 3 Encounters:  07/12/11 122/82  06/02/11 112/60  03/15/11 138/82

## 2011-07-12 NOTE — Patient Instructions (Addendum)
It was good to see you today. Test(s) ordered today. Your results will be called to you after review (48-72hours after test completion). If any changes need to be made, you will be notified at that time. Medications reviewed, no changes at this time. Let us know if you reconsider your flu shot needs - we can give the shot here if you need it Please schedule followup in 6 months for blood pressure monitoring, call sooner if problems.

## 2011-08-30 ENCOUNTER — Ambulatory Visit (HOSPITAL_COMMUNITY)
Admission: RE | Admit: 2011-08-30 | Discharge: 2011-08-30 | Disposition: A | Discharge status: Home / Self Care | Attending: Psychiatry | Admitting: Psychiatry

## 2011-08-30 DIAGNOSIS — F331 Major depressive disorder, recurrent, moderate: Secondary | ICD-10-CM | POA: Insufficient documentation

## 2011-08-30 DIAGNOSIS — F410 Panic disorder [episodic paroxysmal anxiety] without agoraphobia: Secondary | ICD-10-CM | POA: Insufficient documentation
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
## 2011-09-15 ENCOUNTER — Emergency Department (HOSPITAL_COMMUNITY)
Admission: EM | Admit: 2011-09-15 | Discharge: 2011-09-15 | Disposition: A | Discharge status: Home / Self Care | Attending: Emergency Medicine | Admitting: Emergency Medicine

## 2011-09-15 ENCOUNTER — Ambulatory Visit (HOSPITAL_COMMUNITY)
Admission: RE | Admit: 2011-09-15 | Discharge: 2011-09-15 | Disposition: A | Discharge status: Home / Self Care | Attending: Psychiatry | Admitting: Psychiatry

## 2011-09-15 ENCOUNTER — Encounter (HOSPITAL_COMMUNITY): Payer: Self-pay | Admitting: Emergency Medicine

## 2011-09-15 ENCOUNTER — Other Ambulatory Visit: Payer: Self-pay

## 2011-09-15 DIAGNOSIS — R51 Headache: Secondary | ICD-10-CM | POA: Insufficient documentation

## 2011-09-15 DIAGNOSIS — G4700 Insomnia, unspecified: Secondary | ICD-10-CM | POA: Insufficient documentation

## 2011-09-15 DIAGNOSIS — R002 Palpitations: Secondary | ICD-10-CM | POA: Insufficient documentation

## 2011-09-15 DIAGNOSIS — R079 Chest pain, unspecified: Secondary | ICD-10-CM | POA: Insufficient documentation

## 2011-09-15 DIAGNOSIS — F411 Generalized anxiety disorder: Secondary | ICD-10-CM | POA: Insufficient documentation

## 2011-09-15 DIAGNOSIS — F329 Major depressive disorder, single episode, unspecified: Secondary | ICD-10-CM | POA: Insufficient documentation

## 2011-09-15 DIAGNOSIS — Z79899 Other long term (current) drug therapy: Secondary | ICD-10-CM | POA: Insufficient documentation

## 2011-09-15 DIAGNOSIS — F419 Anxiety disorder, unspecified: Secondary | ICD-10-CM

## 2011-09-15 DIAGNOSIS — I10 Essential (primary) hypertension: Secondary | ICD-10-CM | POA: Insufficient documentation

## 2011-09-15 DIAGNOSIS — F3289 Other specified depressive episodes: Secondary | ICD-10-CM | POA: Insufficient documentation

## 2011-09-15 LAB — URINALYSIS, DIPSTICK ONLY
Bilirubin Urine: NEGATIVE
Glucose, UA: NEGATIVE mg/dL
Hgb urine dipstick: NEGATIVE
Ketones, ur: NEGATIVE mg/dL
Nitrite: NEGATIVE
Protein, ur: NEGATIVE mg/dL
Specific Gravity, Urine: 1.024 (ref 1.005–1.030)
Urobilinogen, UA: 0.2 mg/dL (ref 0.0–1.0)
pH: 5 (ref 5.0–8.0)

## 2011-09-15 LAB — POCT I-STAT, CHEM 8
BUN: 22 mg/dL (ref 6–23)
Calcium, Ion: 1.16 mmol/L (ref 1.12–1.32)
Chloride: 101 mEq/L (ref 96–112)
Creatinine, Ser: 0.9 mg/dL (ref 0.50–1.10)
Glucose, Bld: 98 mg/dL (ref 70–99)
HCT: 41 % (ref 36.0–46.0)
Hemoglobin: 13.9 g/dL (ref 12.0–15.0)
Potassium: 3.8 mEq/L (ref 3.5–5.1)
Sodium: 139 mEq/L (ref 135–145)
TCO2: 29 mmol/L (ref 0–100)

## 2011-09-15 LAB — GLUCOSE, CAPILLARY: Glucose-Capillary: 87 mg/dL (ref 70–99)

## 2011-09-15 MED ORDER — FAMOTIDINE 20 MG PO TABS
20.0000 mg | ORAL_TABLET | Freq: Once | ORAL | Status: AC
  Administered 2011-09-15: 20 mg via ORAL
  Filled 2011-09-15: qty 1

## 2011-09-15 MED ORDER — ONDANSETRON 8 MG PO TBDP
8.0000 mg | ORAL_TABLET | Freq: Once | ORAL | Status: AC
  Administered 2011-09-15: 8 mg via ORAL
  Filled 2011-09-15: qty 1

## 2011-09-15 NOTE — ED Notes (Signed)
MD at bedside. 

## 2011-09-15 NOTE — BH Assessment (Signed)
Assessment Note   Erica [name] is an 55 y.o. female. PT PRESENTS WITH INCREASE DEPRESSION & PASSIVE THOUGHTS. PT STATES SHE CURRENTLY SEE DR. PLOVSKY WHO SHE SAID SENT HER 2 WEEKS AGO TO GET INTO THE PSYCH IOP BUT HER [facility] DID NOT COVER THE PROGRAM. PT STATES THEY CALLED RITA CLARK IN PSYCH IOP WHOM THEY BELIEVE GAVE THEM THE IMPRESSION THAT PT'S INSURANCE COULD BE TAKEN IN THE PROGRAM. PT STATES HER MEDS ARE NOT WORKING THAT DR. Donell Beers MADE RECENT CHANGES TO HER MEDS. PT STATES SHE HAS REGULAR ANXIETY & IS UNDER A LOT OF STRESS WITH A LOT OF FAMILY ISSUES. PT SAYS SINCE HER MED CHANGE THAT SHE CANT TAKE ANYTHING & HAS UNSTABLE MOODS AS WELL AS CRYING SPELLS. PT SAYS ALL SHE WANTS IS HELP. PT CLAIMS HER PROVIDER DR. Donell Beers DOES NOT LISTEN TO HER CONCERNS & BELITTLES HER. PT SAYS SHE WAS ALSO LOOKING FOR A PROVIDER WHO TOOK HER INSURANCE & COULD HELP WITH HER SYMPTOMS. PT IS EMOTIONAL & TEARFUL. WHEN PT WAS ASKED WHAT DR. Donell Beers WAS HOPING WE COULD DO FOR HER, PT STATES SHE DID NOT KNOW. PT WAS ASKED IF SHE WAS HAVING ANY CURRENT IDEATION & PT PAUSED & TOOK AWHILE TO RESPOND THEN DENIED IDEATION. PT ALSO DENIED ANY SA NOR BEING DEPENDENT ON HER MEDS. PT WAS ACCOMLANIED BY HER BEST FRIEND WHO IS ALSO CONCERNED THAT PT HAD BEEN DECLINING SINCE SHE STARTED SEEING DR. Donell Beers. PER BF, PT THOUGHTS ARE EVERYWHERE & IS UNABLE TO STAY ON ONE SUBJECT DURING A CONVERSATION. PT WAS RAN BY NEIL MARSHBURN, PA, WHO HAD CONCERNS & WANTED DR. PLOVSKY CONTACTED TO FIND OUT WHAT WHAT HIS CONCERNS WOULD BE. DR. Donell Beers SEEMED SURPRISED WHEN CLINICIAN CALLED TO INQUIRE ABOUT DETAIL INFORMATION ON PT EVENTHOUGH DR. PLOVSKY HAD CALLED SEVERAL HOURS AGO TRYING TO FIND OUT IF HAD BEEN SEEN ON NOV 19TH & WAS VERY RUDE TO STAFF. DR. Donell Beers EXPRESSED THAT PT HAD BEEN UNDER A LOT OF STRESS BUT USES XANAX TO DEAL WITH HER DEPRESSION. HE STATED PT WAS REMOVED FROM XANAX & PUT ON ATIVAN.  PT EXPRESSES THAT HER MOUTH IS FULL OF  SALIVA & MEDS HAS AN AFFECT ON MOUTH. NEIL MARSHBURN, PA WAS INFORMED OF DEVELOPMENT WHO DECIDED TO SEE THAT PT FACE TO FACE. AFTER NIEL MARSHBURN, PA SAW PT & FELT PT MIGHT BE HAVING A SIDE AFFECT FROM MEDS & HAD A LOT OF MEDICAL ISSUES WHICH NEEDED TO BE CLEARED. PA FELT ONCE PT WAS CLEARED SHE COULD EITHER FOLLOW UP WITH PCP OR GET A TELEPSYCH CONSULT FOR A DISPOSITION. PT WAS SENT OVER TO [facility] ED FOR MEDICAL CLEARANCE PER NIEL MARSHBURN, PA. DISPOSITION STILL PENDING  Axis I: Anxiety Disorder NOS and Depressive Disorder NOS Axis II: Deferred Axis III:  Past Medical History  Diagnosis Date  . Hypertension   . ALLERGIC RHINITIS   . Sleep apnea     CPAP hs  . PAF (paroxysmal atrial fibrillation)   . Dyslipidemia   . Panic attacks     Hx of depression  . Osteoarthritis   . Obesity    Axis IV: problems related to social environment, problems with access to health care services and problems with primary support group Axis V: 51-60 moderate symptoms  Past Medical History:  Past Medical History  Diagnosis Date  . Hypertension   . ALLERGIC RHINITIS   . Sleep apnea     CPAP hs  . PAF (paroxysmal atrial fibrillation)   . Dyslipidemia   .  Panic attacks     Hx of depression  . Osteoarthritis   . Obesity     Past Surgical History  Procedure Date  . Right total knee surgery   . Abdominal hysterectomy   . Right oophorectomy     No cancer  . Child birth     x's 3    Family History:  Family History  Problem Relation Age of Onset  . Heart disease Mother   . Dementia Father   . Allergies Sister   . Asthma Son   . Breast cancer Maternal Aunt   . Asthma Other   . Cancer Cousin     colon    Social History:  reports that she has never smoked. She does not have any smokeless tobacco history on file. She reports that she does not drink alcohol or use illicit drugs.  Additional Social History:    Allergies:  Allergies  Allergen Reactions  . Guaifenesin-Codeine      REACTION: feels spacey    Home Medications:  Medications Prior to Admission  Medication Sig Dispense Refill  . ALPRAZolam (XANAX XR) 1 MG 24 hr tablet Take 1 mg by mouth 2 (two) times daily.        Marland Kitchen atenolol (TENORMIN) 25 MG tablet Take 1 tablet (25 mg total) by mouth daily.  90 tablet  3  . b complex vitamins tablet Take 1 tablet by mouth daily.        Marland Kitchen CALCIUM-VITAMIN D PO Take 1 each by mouth daily.        . ciclopirox (PENLAC) 8 % solution Apply topically at bedtime. Apply over nail and surrounding skin. Apply daily over previous coat. After seven (7) days, may remove with alcohol and continue cycle.       . citalopram (CELEXA) 40 MG tablet Take 40 mg by mouth daily.        . clotrimazole-betamethasone (LOTRISONE) cream Apply topically 2 (two) times daily as needed.  15 g  1  . cyclobenzaprine (FLEXERIL) 10 MG tablet Take 10 mg by mouth daily.        Marland Kitchen estradiol-levonorgestrel (CLIMARAPRO) 0.045-0.015 MG/DAY Place 1 patch onto the skin once a week.        . fexofenadine (ALLEGRA) 180 MG tablet Take 1 tablet (180 mg total) by mouth daily.  90 tablet  3  . fish oil-omega-3 fatty acids 1000 MG capsule Take 1 g by mouth daily.        . fluticasone (FLONASE) 50 MCG/ACT nasal spray 1 spray by Nasal route 2 (two) times daily.        . furosemide (LASIX) 20 MG tablet Take 20 mg by mouth every evening.        . furosemide (LASIX) 40 MG tablet Take 40 mg by mouth every morning.        . metoclopramide (REGLAN) 10 MG tablet Take 10 mg by mouth 4 (four) times daily.        Marland Kitchen omeprazole (PRILOSEC) 20 MG capsule Take 1 capsule (20 mg total) by mouth daily.  30 capsule  1  . oxycodone (OXY-IR) 5 MG capsule Take 5 mg by mouth every 4 (four) hours as needed.        . potassium chloride SA (K-DUR,KLOR-CON) 20 MEQ tablet Take 20 mEq by mouth 2 (two) times daily.        . simvastatin (ZOCOR) 40 MG tablet Take 40 mg by mouth at bedtime.  No current facility-administered medications on file as of  09/15/2011.    OB/GYN Status:  No LMP recorded. Patient has had a hysterectomy.                                                      Disposition: PENDING MEDICAL CLEARANCE    On Site Evaluation by:   Reviewed with Physician:     Waldron Session 09/15/2011 4:38 PM

## 2011-09-15 NOTE — ED Provider Notes (Signed)
History     CSN: [id number] Arrival date & time: 09/15/2011  7:13 PM   First MD Initiated Contact with Patient 09/15/11 2106      Chief Complaint  Patient presents with  . Panic Attack    (Consider location/radiation/quality/duration/timing/severity/associated sxs/prior treatment) HPI Comments: Patient presents complaining of multiple symptoms.  Patient states that 2 weeks ago she was started on Wellbutrin and Ativan for her anxiety and insomnia.  She states that since that time she has noted increased saliva production and still has insomnia.  Patient is not short of breath.  She notes intermittent chest tightness and palpitations.  She has had this before with her panic attacks.  Patient denies any cough or fevers.  The pain does not occur with exertion specifically.  Patient also states she has some intermittent nausea but no abdominal pain.  She is Re: spoken with her psychiatrist as advised her to stop the Wellbutrin.  They advised her to come for further evaluation to the emergency department or to her primary care doctor for further evaluation of her increased saliva production and other symptoms.  Patient is a 55 y.o. female presenting with anxiety. The history is provided by the patient and a relative.  Anxiety Associated symptoms include chest pain and headaches. Pertinent negatives include no abdominal pain and no shortness of breath.    Past Medical History  Diagnosis Date  . Hypertension   . ALLERGIC RHINITIS   . Sleep apnea     CPAP hs  . PAF (paroxysmal atrial fibrillation)   . Dyslipidemia   . Panic attacks     Hx of depression  . Osteoarthritis   . Obesity     Past Surgical History  Procedure Date  . Right total knee surgery   . Abdominal hysterectomy   . Right oophorectomy     No cancer  . Child birth     x's 3    Family History  Problem Relation Age of Onset  . Heart disease Mother   . Dementia Father   . Allergies Sister   . Asthma Son   . Breast  cancer Maternal Aunt   . Asthma Other   . Cancer Cousin     colon    History  Substance Use Topics  . Smoking status: Never Smoker   . Smokeless tobacco: Not on file   Comment: Married with children. 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 Alcohol Use: No    OB History    Grav Para Term Preterm Abortions TAB SAB Ect Mult Living                  Review of Systems  Constitutional: Negative.  Negative for fever and chills.  Eyes: Negative.  Negative for discharge and redness.  Respiratory: Negative.  Negative for cough and shortness of breath.   Cardiovascular: Positive for chest pain.  Gastrointestinal: Positive for nausea. Negative for vomiting, abdominal pain and diarrhea.  Genitourinary: Negative.  Negative for dysuria and vaginal discharge.  Musculoskeletal: Negative.  Negative for back pain.  Skin: Negative.  Negative for color change and rash.  Neurological: Positive for headaches. Negative for syncope.  Hematological: Negative.  Negative for adenopathy.  Psychiatric/Behavioral: Positive for sleep disturbance. Negative for confusion.  All other systems reviewed and are negative.    Allergies  Guaifenesin-codeine  Home Medications   Current Outpatient Rx  Name Route Sig Dispense Refill  . ATENOLOL 25 MG PO TABS Oral  Take 1 tablet (25 mg total) by mouth daily. 90 tablet 3  . CYCLOBENZAPRINE HCL 10 MG PO TABS Oral Take 10 mg by mouth 3 (three) times daily as needed. For pain.    Marland Kitchen FEXOFENADINE HCL 180 MG PO TABS Oral Take 1 tablet (180 mg total) by mouth daily. 90 tablet 3  . FLUTICASONE PROPIONATE 50 MCG/ACT NA SUSP Nasal 1 spray by Nasal route 2 (two) times daily.      . FUROSEMIDE 20 MG PO TABS Oral Take 20 mg by mouth every evening.      . FUROSEMIDE 40 MG PO TABS Oral Take 40 mg by mouth every morning.      Marland Kitchen LORAZEPAM 0.5 MG PO TABS Oral Take 1 mg by mouth 2 (two) times daily.      Marland Kitchen OMEPRAZOLE 20 MG PO CPDR Oral Take 1 capsule (20 mg  total) by mouth daily. 30 capsule 1  . OXYCODONE HCL 5 MG PO CAPS Oral Take 5 mg by mouth every 4 (four) hours as needed. For pain.    Marland Kitchen POTASSIUM CHLORIDE CRYS CR 20 MEQ PO TBCR Oral Take 20 mEq by mouth 2 (two) times daily.      Marland Kitchen SIMVASTATIN 40 MG PO TABS Oral Take 40 mg by mouth daily.       BP 126/87  Pulse 98  Temp(Src) 98 F (36.7 C) (Oral)  Resp 16  Wt 240 lb (108.863 kg)  SpO2 99%  Physical Exam  Constitutional: She is oriented to person, place, and time. She appears well-developed and well-nourished.  Non-toxic appearance. She does not have a sickly appearance.  HENT:  Head: Normocephalic and atraumatic.       Patient's oropharynx is clear, uvula is midline and there is no swelling erythema or exudates.  Eyes: Conjunctivae, EOM and lids are normal. Pupils are equal, round, and reactive to light. No scleral icterus.  Neck: Trachea normal and normal range of motion. Neck supple.  Cardiovascular: Normal rate, regular rhythm and normal heart sounds.   Pulmonary/Chest: Effort normal and breath sounds normal. No respiratory distress. She has no wheezes. She has no rales.  Abdominal: Soft. Normal appearance. There is no tenderness. There is no rebound, no guarding and no CVA tenderness.  Musculoskeletal: Normal range of motion.  Neurological: She is alert and oriented to person, place, and time. She has normal strength.  Skin: Skin is warm, dry and intact. No rash noted.  Psychiatric: She has a normal mood and affect. Her behavior is normal. Judgment and thought content normal.    ED Course  Procedures (including critical care time)  Results for orders placed during the hospital encounter of 09/15/11  URINALYSIS, DIPSTICK ONLY      Component Value Range   Specific Gravity, Urine 1.024  1.005 - 1.030    pH 5.0  5.0 - 8.0    Glucose, UA NEGATIVE  NEGATIVE (mg/dL)   Hgb urine dipstick NEGATIVE  NEGATIVE    Bilirubin Urine NEGATIVE  NEGATIVE    Ketones, ur NEGATIVE  NEGATIVE  (mg/dL)   Protein, ur NEGATIVE  NEGATIVE (mg/dL)   Urobilinogen, UA 0.2  0.0 - 1.0 (mg/dL)   Nitrite NEGATIVE  NEGATIVE    Leukocytes, UA SMALL (*) NEGATIVE   GLUCOSE, CAPILLARY      Component Value Range   Glucose-Capillary 87  70 - 99 (mg/dL)   Comment 1 Notify RN    POCT I-STAT, CHEM 8      Component Value Range  Sodium 139  135 - 145 (mEq/L)   Potassium 3.8  3.5 - 5.1 (mEq/L)   Chloride 101  96 - 112 (mEq/L)   BUN 22  6 - 23 (mg/dL)   Creatinine, Ser 1.61  0.50 - 1.10 (mg/dL)   Glucose, Bld 98  70 - 99 (mg/dL)   Calcium, Ion 0.96  0.45 - 1.32 (mmol/L)   TCO2 29  0 - 100 (mmol/L)   Hemoglobin 13.9  12.0 - 15.0 (g/dL)   HCT 40.9  81.1 - 91.4 (%)   No results found.   Date: 09/15/2011  Rate: 73  Rhythm: normal sinus rhythm  QRS Axis: normal  Intervals: normal  ST/T Wave abnormalities: normal  Conduction Disutrbances:none  Narrative Interpretation:   Old EKG Reviewed: unchanged from 06/11/2006    MDM  Patient with unclear etiology for her complaints of increased saliva production, anxiety and insomnia.  Patient may have had a reaction to her new Wellbutrin which she is artery stops at this point in time.  Patient has no signs of arrhythmia on her EKG did indicate that as a cause for her palpitations she has described as well.  She has no acute signs of infection at this point in time and has normal vital signs here.  She has a normal glucose is well.  She is not anemic and does not show signs of UTI.  Patient is already on ativan to assist with sleep and at this point I will advise the patient to continue to followup with her psychiatrist as well as with her primary care physician for further evaluation of some of these ongoing issues.        Nat Christen, MD 09/15/11 [phone]

## 2011-09-15 NOTE — ED Notes (Signed)
Pt alert, nad, presents from [facility], instructed to come to ED for evaluation of medications, pt c/o lack of sleep, anxiety, onset two weeks ago after starting new medication, resp even unalobored, skin pwd, denies SI/HI

## 2011-09-15 NOTE — ED Notes (Signed)
Family at bedside. 

## 2011-09-15 NOTE — ED Notes (Signed)
Pt. Calm, cooperative. States she feels better.

## 2011-09-20 ENCOUNTER — Encounter: Payer: Self-pay | Admitting: Internal Medicine

## 2011-09-20 ENCOUNTER — Ambulatory Visit (INDEPENDENT_AMBULATORY_CARE_PROVIDER_SITE_OTHER): Admitting: Internal Medicine

## 2011-09-20 VITALS — BP 112/92 | HR 77 | Temp 98.5°F | Wt 242.0 lb

## 2011-09-20 DIAGNOSIS — F339 Major depressive disorder, recurrent, unspecified: Secondary | ICD-10-CM

## 2011-09-20 MED ORDER — PAROXETINE HCL 10 MG PO TABS: 10.0000 mg | ORAL_TABLET | Freq: Every day | ORAL | Status: DC

## 2011-09-20 MED ORDER — ALPRAZOLAM 1 MG PO TABS: 1.0000 mg | ORAL_TABLET | Freq: Two times a day (BID) | ORAL | Status: DC | PRN

## 2011-09-20 NOTE — Patient Instructions (Signed)
It was good to see you today. We have reviewed your recent emergency room records and medication changes Stop Ativan Start low-dose generic Paxil every day for depression and anxiety and use short-acting Xanax as needed for anxiety or sleep Your prescription(s) have been submitted to your pharmacy. Please take as directed and contact our office if you believe you are having problem(s) with the medication(s). we'll make referral to counseling but is covered by your insurance. Our office will contact you regarding appointment(s) once made. Please schedule followup in 4-6 weeks, call sooner if problems.

## 2011-09-20 NOTE — Assessment & Plan Note (Signed)
Long history of same -recent treatment by psychiatrist Dr. Donzetta Sprung as well as behavioral health December 5 -ER notes reviewed Denies SI/HI Intolerant of Wellbutrin due to palpitations, intolerant to Zoloft >? tremor Previously on max as citalopram with fair relief but wishes to try different agent Also chronic benzodiazepine use recently changed from Xanax to Ativan, but patient wishes to resume alprazolam Start low-dose  Generic Paxil and resume alprazolam, short acting not extended release Refer for counseling Followup 4-6 weeks, call sooner if worse

## 2011-09-20 NOTE — Progress Notes (Signed)
  Subjective:    Patient ID: Erica [name], female    DOB: 11/21/1955, 55 y.o.   MRN: [id number]  HPI here for follow up - depression ER visit 12/5 for "reaction" to generic wellbutrin: palpitations and increase anxiety  depression - see above regarding adverse reaction to generic wellbutrin -  previously on max dose citalopram daily and xanax as needed -  Stress with family (mpom ill, g-dtr in trouble), $$ and marriage problems precipitated current flare Increase anxiety - no SI/HI    Past Medical History  Diagnosis Date  . Hypertension   . ALLERGIC RHINITIS   . Sleep apnea     CPAP hs  . PAF (paroxysmal atrial fibrillation)   . Dyslipidemia   . Panic attacks     Hx of depression  . Osteoarthritis   . Obesity      Review of Systems  Constitutional: Positive for fatigue. Negative for fever and unexpected weight change.  Cardiovascular: Negative for chest pain and leg swelling.  Gastrointestinal: Negative for nausea, vomiting and abdominal pain.       Objective:   Physical Exam  BP 112/92  Pulse 77  Temp(Src) 98.5 F (36.9 C) (Oral)  Wt 242 lb (109.77 kg)  SpO2 97% Wt Readings from Last 3 Encounters:  09/20/11 242 lb (109.77 kg)  09/15/11 240 lb (108.863 kg)  07/12/11 241 lb 9.6 oz (109.589 kg)   Constitutional: She is obese. She appears well-developed and well-nourished. No distress.  Neck: Normal range of motion. Neck supple. No JVD present. No thyromegaly present.  Cardiovascular: Normal rate, regular rhythm and normal heart sounds.  No murmur heard. No BLE edema. Pulmonary/Chest: Effort normal and breath sounds normal. No respiratory distress. She has no wheezes.  Psychiatric: She has a tearful, depressed mood and affect. Her behavior is otherwise normal. Judgment and thought content normal.   . Lab Results  Component Value Date   WBC 7.0 01/04/2011   HGB 13.9 09/15/2011   HCT 41.0 09/15/2011   PLT 227.0 01/04/2011   CHOL 167 07/12/2011   TRIG 83.0  07/12/2011   HDL 46.60 07/12/2011   ALT 12 07/12/2011   AST 15 07/12/2011   NA 139 09/15/2011   K 3.8 09/15/2011   CL 101 09/15/2011   CREATININE 0.90 09/15/2011   BUN 22 09/15/2011   CO2 29 06/29/2010   TSH 1.81 01/04/2011   HGBA1C 6.2 01/23/2009        Assessment & Plan:   See problem list. Medications and labs reviewed today.

## 2011-09-29 ENCOUNTER — Ambulatory Visit: Admitting: *Deleted

## 2011-09-30 ENCOUNTER — Ambulatory Visit: Admitting: *Deleted

## 2011-10-11 ENCOUNTER — Encounter: Payer: Self-pay | Admitting: *Deleted

## 2011-10-11 ENCOUNTER — Encounter: Attending: Obstetrics and Gynecology | Admitting: *Deleted

## 2011-10-11 DIAGNOSIS — Z713 Dietary counseling and surveillance: Secondary | ICD-10-CM | POA: Insufficient documentation

## 2011-10-11 NOTE — Patient Instructions (Addendum)
Goals: Aim for 2-3 Carb Choices per meal, 0-1 per snack if hungry Read food labels for total carbohydrate, fat and sodium  Increase activity level by riding stationary bike at home for 5 minutes, 3 times per day, every day as tolerated

## 2011-10-11 NOTE — Progress Notes (Signed)
  Medical Nutrition Therapy:  Appt start time: 1100 end time:  1200.   Assessment:  Primary concerns today: patient states she weighs more now than she ever has in her life. 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    MEDICATIONS: see list   DIETARY INTAKE:  Usual eating pattern includes 2 meals and 0-2 snacks per day.  Everyday foods include good variety of most food groups.  Avoided foods include fried foods.    24-hr recall:  B ( AM): something small like cheerios and 1/2 banana  Snk ( AM): none  L ( PM): skips usually Snk ( PM): none except desserts occasionally D ( PM): meat, veg, starch prepared by self Snk ( PM): mixed nuts with dried fruit or snack peas Beverages: had decreased regular soda, drinking more water now.  Usual physical activity: stationary bike for about 5 minutes 2-3 times per week, physical therapy twice weekly for 2 more weeks post knee surgery.  Estimated energy needs: 1400 calories 158 g carbohydrates 105 g protein 39 g fat  Progress Towards Goal(s):  In progress.   Nutritional Diagnosis:  NI-1.5 Excessive energy intake As related to limited activity level.  As evidenced by BMI of 38.3.    Intervention:  Nutrition counseling provided on basic macro-nutrients, calorie value of each and how to balance calories with activity level for weight loss. Acknowledged her eating habits that she has changed recently and suggested further changes including 3 meals/day and lowering fat intake. Goals: Aim for 2-3 Carb Choices per meal, 0-1 per snack if hungry Read food labels for total carbohydrate, fat and sodium  Increase activity level by riding stationary bike at home for 5 minutes, 3 times per day, every day as tolerated  Handouts given during visit include:  Carb Counting and  Beyond and Label Reading handouts  Meal Plan Card  Monitoring/Evaluation:  Dietary intake, exercise, Label Reading, and body weight prn.

## 2011-10-18 ENCOUNTER — Ambulatory Visit (INDEPENDENT_AMBULATORY_CARE_PROVIDER_SITE_OTHER): Admitting: Psychiatry

## 2011-10-18 DIAGNOSIS — F411 Generalized anxiety disorder: Secondary | ICD-10-CM

## 2011-10-18 DIAGNOSIS — F329 Major depressive disorder, single episode, unspecified: Secondary | ICD-10-CM

## 2011-10-20 ENCOUNTER — Ambulatory Visit (INDEPENDENT_AMBULATORY_CARE_PROVIDER_SITE_OTHER): Admitting: Internal Medicine

## 2011-10-20 ENCOUNTER — Encounter: Payer: Self-pay | Admitting: Internal Medicine

## 2011-10-20 VITALS — BP 112/72 | HR 88 | Temp 97.9°F | Wt 241.8 lb

## 2011-10-20 DIAGNOSIS — F339 Major depressive disorder, recurrent, unspecified: Secondary | ICD-10-CM

## 2011-10-20 MED ORDER — PAROXETINE HCL 10 MG PO TABS: 10.0000 mg | ORAL_TABLET | Freq: Every day | ORAL | Status: DC

## 2011-10-20 MED ORDER — ALPRAZOLAM 1 MG PO TABS: 1.0000 mg | ORAL_TABLET | Freq: Two times a day (BID) | ORAL | Status: DC | PRN

## 2011-10-20 NOTE — Patient Instructions (Signed)
It was good to see you today. Continue same dose generic Paxil every day for depression and anxiety and use short-acting Xanax as needed for anxiety or sleep Your prescription(s) have been submitted to your local pharmacy. Please take as directed and contact our office if you believe you are having problem(s) with the medication(s). Also 90 day prescriptions given to you to sent to [facility] Continue counseling  Please schedule followup in 3-4 months for blood pressure check and review, call sooner if problems.

## 2011-10-20 NOTE — Progress Notes (Signed)
  Subjective:    Patient ID: Erica [name], female    DOB: 08-09-1956, 56 y.o.   MRN: [id number]  HPI here for follow up -   depression/anxiety Long hx same ER visit 09/15/11 for "reaction" to generic wellbutrin: palpitations and increase anxiety remotely on citalopram daily and xanax as needed -  Stress with family (mom ill, g-dtr), $$ and marriage problems precipitated current flare fall 2012 Started paxil and xanax prn 09/2011, also started in counseling 10/2010 (dr Noe Gens) no SI/HI    Past Medical History  Diagnosis Date  . Hypertension   . ALLERGIC RHINITIS   . Sleep apnea     CPAP hs  . PAF (paroxysmal atrial fibrillation)   . Dyslipidemia   . Panic attacks     Hx of depression  . Osteoarthritis   . Obesity      Review of Systems  Constitutional: Positive for fatigue. Negative for fever and unexpected weight change.  Cardiovascular: Negative for chest pain and leg swelling.  Gastrointestinal: Negative for nausea, vomiting and abdominal pain.       Objective:   Physical Exam  BP 112/72  Pulse 88  Temp(Src) 97.9 F (36.6 C) (Oral)  Wt 241 lb 12.8 oz (109.68 kg)  SpO2 98% Wt Readings from Last 3 Encounters:  10/20/11 241 lb 12.8 oz (109.68 kg)  10/11/11 244 lb 1.6 oz (110.723 kg)  09/20/11 242 lb (109.77 kg)   Constitutional: She is obese. She appears well-developed and well-nourished. No distress.  Cardiovascular: Normal rate, regular rhythm and normal heart sounds.  No murmur heard. No BLE edema. Pulmonary/Chest: Effort normal and breath sounds normal. No respiratory distress. She has no wheezes.  Psychiatric: She has a dysphoric mood and affect. Her behavior is otherwise normal. Judgment and thought content normal.   . Lab Results  Component Value Date   WBC 7.0 01/04/2011   HGB 13.9 09/15/2011   HCT 41.0 09/15/2011   PLT 227.0 01/04/2011   CHOL 167 07/12/2011   TRIG 83.0 07/12/2011   HDL 46.60 07/12/2011   ALT 12 07/12/2011   AST 15 07/12/2011   NA  139 09/15/2011   K 3.8 09/15/2011   CL 101 09/15/2011   CREATININE 0.90 09/15/2011   BUN 22 09/15/2011   CO2 29 06/29/2010   TSH 1.81 01/04/2011   HGBA1C 6.2 01/23/2009        Assessment & Plan:   See problem list. Medications and labs reviewed today.

## 2011-10-20 NOTE — Assessment & Plan Note (Signed)
Long history of same -overlap with anxiety prior treatment by psychiatrist Dr. Donzetta Sprung as well as behavioral health/ER December 5,2012 Denies SI/HI Intolerant of Wellbutrin due to palpitations, intolerant to Zoloft >? tremor Previously on max as citalopram but changed to paxil 09/2011 Also chronic benzodiazepine  -alprazolam prn Continue same, refills done Continue counseling - call id symptoms worse or unimproved

## 2011-10-27 ENCOUNTER — Ambulatory Visit: Admitting: Psychiatry

## 2011-11-05 ENCOUNTER — Ambulatory Visit (INDEPENDENT_AMBULATORY_CARE_PROVIDER_SITE_OTHER): Admitting: Internal Medicine

## 2011-11-05 ENCOUNTER — Ambulatory Visit: Admitting: Internal Medicine

## 2011-11-05 ENCOUNTER — Encounter: Payer: Self-pay | Admitting: Internal Medicine

## 2011-11-05 VITALS — BP 120/78 | HR 80 | Temp 98.7°F | Resp 16

## 2011-11-05 DIAGNOSIS — J019 Acute sinusitis, unspecified: Secondary | ICD-10-CM

## 2011-11-05 MED ORDER — CEFUROXIME AXETIL 500 MG PO TABS: 500.0000 mg | ORAL_TABLET | Freq: Two times a day (BID) | ORAL | Status: AC

## 2011-11-05 MED ORDER — PROMETHAZINE-DM 6.25-15 MG/5ML PO SYRP: 5.0000 mL | ORAL_SOLUTION | Freq: Four times a day (QID) | ORAL | Status: AC | PRN

## 2011-11-05 NOTE — Patient Instructions (Signed)

## 2011-11-05 NOTE — Progress Notes (Signed)
Subjective:    Patient ID: Erica [name], female    DOB: 10-24-1955, 56 y.o.   MRN: [id number]  Sinusitis This is a new problem. The current episode started 1 to 4 weeks ago. The problem has been gradually worsening since onset. There has been no fever. Her pain is at a severity of 0/10. She is experiencing no pain. Associated symptoms include chills, congestion, coughing and sinus pressure. Pertinent negatives include no diaphoresis, ear pain, headaches, hoarse voice, neck pain, shortness of breath, sneezing, sore throat or swollen glands. Past treatments include nothing.      Review of Systems  Constitutional: Positive for chills. Negative for fever, diaphoresis, activity change, appetite change, fatigue and unexpected weight change.  HENT: Positive for congestion and sinus pressure. Negative for ear pain, nosebleeds, sore throat, hoarse voice, facial swelling, rhinorrhea, sneezing, trouble swallowing, neck pain, voice change, postnasal drip, tinnitus and ear discharge.   Eyes: Negative.   Respiratory: Positive for cough. Negative for apnea, choking, chest tightness, shortness of breath, wheezing and stridor.   Cardiovascular: Negative for chest pain, palpitations and leg swelling.  Gastrointestinal: Negative for nausea, vomiting, diarrhea, constipation, blood in stool, abdominal distention and anal bleeding.  Genitourinary: Negative.   Musculoskeletal: Negative for myalgias, back pain, joint swelling, arthralgias and gait problem.  Skin: Negative for color change, pallor, rash and wound.  Neurological: Negative for dizziness, tremors, seizures, syncope, facial asymmetry, speech difficulty, weakness, light-headedness, numbness and headaches.  Hematological: Negative for adenopathy. Does not bruise/bleed easily.  Psychiatric/Behavioral: Negative.        Objective:   Physical Exam  Vitals reviewed. Constitutional: She is oriented to person, place, and time. She appears well-developed  and well-nourished. No distress.  HENT:  Head: Normocephalic and atraumatic.  Right Ear: Hearing, tympanic membrane, external ear and ear canal normal.  Left Ear: Hearing, tympanic membrane, external ear and ear canal normal.  Nose: Mucosal edema and rhinorrhea present. No nose lacerations, sinus tenderness, nasal deformity, septal deviation or nasal septal hematoma. No epistaxis.  No foreign bodies. Right sinus exhibits maxillary sinus tenderness. Right sinus exhibits no frontal sinus tenderness. Left sinus exhibits maxillary sinus tenderness. Left sinus exhibits no frontal sinus tenderness.  Mouth/Throat: Oropharynx is clear and moist and mucous membranes are normal. Mucous membranes are not pale, not dry and not cyanotic. No uvula swelling. No oropharyngeal exudate, posterior oropharyngeal edema, posterior oropharyngeal erythema or tonsillar abscesses.  Eyes: Conjunctivae are normal. Right eye exhibits no discharge. Left eye exhibits no discharge. No scleral icterus.  Neck: Normal range of motion. Neck supple. No JVD present. No tracheal deviation present. No thyromegaly present.  Cardiovascular: Normal rate, regular rhythm, normal heart sounds and intact distal pulses.  Exam reveals no gallop and no friction rub.   No murmur heard. Pulmonary/Chest: Effort normal and breath sounds normal. No stridor. No respiratory distress. She has no wheezes. She has no rales. She exhibits no tenderness.  Abdominal: Soft. Bowel sounds are normal. She exhibits no distension and no mass. There is no tenderness. There is no rebound and no guarding.  Musculoskeletal: Normal range of motion. She exhibits no edema and no tenderness.  Lymphadenopathy:    She has no cervical adenopathy.  Neurological: She is oriented to person, place, and time.  Skin: Skin is warm and dry. No rash noted. She is not diaphoretic. No erythema. No pallor.  Psychiatric: She has a normal mood and affect. Her behavior is normal. Judgment and  thought content normal.  Assessment & Plan:

## 2011-11-05 NOTE — Assessment & Plan Note (Signed)
Start ceftin for the infection and a cough suppressant 

## 2011-11-08 ENCOUNTER — Ambulatory Visit: Admitting: Psychiatry

## 2011-11-09 ENCOUNTER — Ambulatory Visit: Admitting: Psychiatry

## 2011-11-22 ENCOUNTER — Telehealth: Payer: Self-pay

## 2011-11-22 MED ORDER — SIMVASTATIN 40 MG PO TABS: 40.0000 mg | ORAL_TABLET | Freq: Every day | ORAL | Status: DC

## 2011-11-22 NOTE — Telephone Encounter (Signed)
Rx mailed to pt to forward to the [facility] hospital per her request

## 2011-12-13 ENCOUNTER — Ambulatory Visit (INDEPENDENT_AMBULATORY_CARE_PROVIDER_SITE_OTHER): Admitting: Internal Medicine

## 2011-12-13 ENCOUNTER — Encounter: Payer: Self-pay | Admitting: Internal Medicine

## 2011-12-13 VITALS — BP 112/80 | HR 89 | Temp 97.1°F

## 2011-12-13 DIAGNOSIS — J329 Chronic sinusitis, unspecified: Secondary | ICD-10-CM

## 2011-12-13 DIAGNOSIS — J309 Allergic rhinitis, unspecified: Secondary | ICD-10-CM

## 2011-12-13 DIAGNOSIS — G4733 Obstructive sleep apnea (adult) (pediatric): Secondary | ICD-10-CM

## 2011-12-13 MED ORDER — AMOXICILLIN-POT CLAVULANATE 875-125 MG PO TABS: 1.0000 | ORAL_TABLET | Freq: Two times a day (BID) | ORAL | Status: DC

## 2011-12-13 MED ORDER — GUAIFENESIN ER 600 MG PO TB12: 1200.0000 mg | ORAL_TABLET | Freq: Two times a day (BID) | ORAL | Status: DC

## 2011-12-13 NOTE — Assessment & Plan Note (Signed)
Pt ? If CPAP making sinus problems "worse" Advised unlikely - to follow up with pulm if continued concerns No changes in CPAP recommended

## 2011-12-13 NOTE — Patient Instructions (Signed)
It was good to see you today. Augmentin antibiotics - Your prescription(s) have been submitted to your pharmacy. Please take as directed and contact our office if you believe you are having problem(s) with the medication(s). Also use Mucinex tabs 2x/day for next 7-10days to help decrease mucus production Other Medications reviewed, no changes at this time.

## 2011-12-13 NOTE — Progress Notes (Signed)
  Subjective:   HPI  complains of cold symptoms  Onset >1 week ago, progressive pressure and "mucus" symptoms  associated with rhinorrhea, sore throat, mild headache and low grade fever Also myalgias, sinus pressure and mild-mod chest congestion - "phlegm stuck in throat and upper chest" No relief with current allergy meds Precipitated by sick contacts  Past Medical History  Diagnosis Date  . Hypertension   . ALLERGIC RHINITIS   . Sleep apnea     CPAP hs  . PAF (paroxysmal atrial fibrillation)   . Dyslipidemia   . Panic attacks     Hx of depression  . Osteoarthritis   . Obesity     Review of Systems Constitutional: No night sweats, no unexpected weight change Pulmonary: No pleurisy or hemoptysis Cardiovascular: No chest pain or palpitations     Objective:   Physical Exam BP 112/80  Pulse 89  Temp(Src) 97.1 F (36.2 C) (Oral)  SpO2 97% GEN: mildly ill appearing and audible head/chest congestion HENT: NCAT, mild sinus tenderness bilaterally, nares with clear discharge, oropharynx mild erythema, no exudate Eyes: Vision grossly intact, no conjunctivitis Lungs: Clear to auscultation without rhonchi or wheeze, no increased work of breathing Cardiovascular: Regular rate and rhythm, no bilateral edema      Assessment & Plan:  Viral URI >> recurrent sinusitis Cough, postnasal drip related to above Chronic allergic rhinitis    Empiric antibiotics prescribed due to symptom duration greater than 7 days Advised mucinex cough suppression - new prescriptions done Symptomatic care with Tylenol or Advil, hydration and rest -  salt gargle advised as needed

## 2011-12-27 ENCOUNTER — Other Ambulatory Visit: Payer: Self-pay | Admitting: Internal Medicine

## 2011-12-27 MED ORDER — FLUCONAZOLE 150 MG PO TABS: 150.0000 mg | ORAL_TABLET | Freq: Once | ORAL | Status: AC

## 2011-12-27 NOTE — Telephone Encounter (Signed)
Pt requesting yeast infection prescription.  Pt ph# [phone]--cell.   Home [phone]--[facility] [address]

## 2011-12-27 NOTE — Telephone Encounter (Signed)
Called pt no answer left msg on cell md sent diflucan to [facility] pharmacy.... 12/27/11@2 :52pm/LMB

## 2011-12-27 NOTE — Telephone Encounter (Signed)
Diflucan - erx done 

## 2012-01-10 ENCOUNTER — Ambulatory Visit: Admitting: Internal Medicine

## 2012-03-13 ENCOUNTER — Other Ambulatory Visit: Payer: Self-pay | Admitting: Internal Medicine

## 2012-03-13 ENCOUNTER — Telehealth: Payer: Self-pay | Admitting: Internal Medicine

## 2012-03-13 ENCOUNTER — Telehealth: Payer: Self-pay | Admitting: *Deleted

## 2012-03-13 DIAGNOSIS — Z0000 Encounter for general adult medical examination without abnormal findings: Secondary | ICD-10-CM

## 2012-03-13 MED ORDER — FUROSEMIDE 40 MG PO TABS: 40.0000 mg | ORAL_TABLET | ORAL | Status: DC

## 2012-03-13 MED ORDER — FUROSEMIDE 20 MG PO TABS: 20.0000 mg | ORAL_TABLET | Freq: Every evening | ORAL | Status: DC

## 2012-03-13 NOTE — Telephone Encounter (Signed)
Requesting lasix refills on both sizes to pick up to take to the [place] .

## 2012-03-13 NOTE — Telephone Encounter (Signed)
Message copied by Deatra James on Mon Mar 13, 2012  1:05 PM ------      Message from: COUSIN, SHARON T      Created: Mon Mar 13, 2012 11:30 AM      Regarding: PHY DATE 07/13/12       THANKS

## 2012-03-13 NOTE — Telephone Encounter (Signed)
Received staff msg made cpx for Oct. Need labs entered... 03/13/12@1 :05am/LMB

## 2012-03-13 NOTE — Telephone Encounter (Signed)
Done erx 

## 2012-03-13 NOTE — Telephone Encounter (Signed)
Notified pt rx's ready for pick-up... 03/13/12@11 :25am/LMB

## 2012-03-14 NOTE — Telephone Encounter (Signed)
Already notified pt rx ready for pick-up... 03/14/12@8 :26am/LMB

## 2012-04-05 ENCOUNTER — Telehealth: Payer: Self-pay | Admitting: *Deleted

## 2012-04-05 MED ORDER — SIMVASTATIN 40 MG PO TABS
40.0000 mg | ORAL_TABLET | Freq: Every day | ORAL | Status: DC
Start: 2012-04-05 — End: 2012-04-06

## 2012-04-05 NOTE — Telephone Encounter (Signed)
Left msg on vm out of her [facility]Marland KitchenNotified pt rx sent to pharmacy 04/05/12@3 :01pm/LMB

## 2012-04-06 ENCOUNTER — Encounter: Payer: Self-pay | Admitting: Endocrinology

## 2012-04-06 ENCOUNTER — Ambulatory Visit (INDEPENDENT_AMBULATORY_CARE_PROVIDER_SITE_OTHER): Admitting: Endocrinology

## 2012-04-06 VITALS — BP 102/70 | HR 78 | Temp 98.3°F

## 2012-04-06 DIAGNOSIS — J329 Chronic sinusitis, unspecified: Secondary | ICD-10-CM

## 2012-04-06 DIAGNOSIS — J209 Acute bronchitis, unspecified: Secondary | ICD-10-CM

## 2012-04-06 MED ORDER — SIMVASTATIN 40 MG PO TABS: 40.0000 mg | ORAL_TABLET | Freq: Every day | ORAL | Status: DC

## 2012-04-06 MED ORDER — CEFTRIAXONE SODIUM 500 MG IJ SOLR
500.0000 mg | Freq: Once | INTRAMUSCULAR | Status: AC
  Administered 2012-04-06: 500 mg via INTRAMUSCULAR

## 2012-04-06 MED ORDER — PROMETHAZINE-DM 6.25-15 MG/5ML PO SYRP: 5.0000 mL | ORAL_SOLUTION | Freq: Four times a day (QID) | ORAL | Status: AC | PRN

## 2012-04-06 NOTE — Patient Instructions (Addendum)
I hope you feel better soon.  If you don't feel better by next week, please call back.  Please call sooner if you get worse.   

## 2012-04-06 NOTE — Progress Notes (Signed)
Subjective:    Patient ID: Erica [name], female    DOB: Oct 01, 1956, 56 y.o.   MRN: [id number]  HPI Pt states few days of moderate prod-quality cough, and slight assoc bloody sputum.   Past Medical History  Diagnosis Date  . Hypertension   . ALLERGIC RHINITIS   . Sleep apnea     CPAP hs  . PAF (paroxysmal atrial fibrillation)   . Dyslipidemia   . Panic attacks     Hx of depression  . Osteoarthritis   . Obesity     Past Surgical History  Procedure Date  . Right total knee surgery   . Abdominal hysterectomy   . Right oophorectomy     No cancer  . Child birth     x's 3    History   Social History  . Marital Status: Married    Spouse Name: N/A    Number of Children: N/A  . Years of Education: N/A   Occupational History  . Not on file.   Social History Main Topics  . Smoking status: Never Smoker   . Smokeless tobacco: Not on file   Comment: Married with children. 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 Alcohol Use: No  . Drug Use: No  . Sexually Active: Not on file   Other Topics Concern  . Not on file   Social History Narrative   Married with children. Pt is on disablity. Previous worked in the school system    Current Outpatient Prescriptions on File Prior to Visit  Medication Sig Dispense Refill  . ALPRAZolam (XANAX) 1 MG tablet Take 1 tablet (1 mg total) by mouth 2 (two) times daily as needed for sleep or anxiety.  180 tablet  1  . cyclobenzaprine (FLEXERIL) 10 MG tablet Take 10 mg by mouth 3 (three) times daily as needed. For pain.      . fexofenadine (ALLEGRA) 180 MG tablet Take 1 tablet (180 mg total) by mouth daily.  90 tablet  3  . fluticasone (FLONASE) 50 MCG/ACT nasal spray 1 spray by Nasal route 2 (two) times daily.        . furosemide (LASIX) 20 MG tablet TAKE 1 TABLET BY MOUTH EVERY EVENING  90 tablet  0  . furosemide (LASIX) 40 MG tablet Take 1 tablet (40 mg total) by mouth every morning.  90 tablet  1  . guaiFENesin  (MUCINEX) 600 MG 12 hr tablet Take 2 tablets (1,200 mg total) by mouth 2 (two) times daily.      Marland Kitchen oxycodone (OXY-IR) 5 MG capsule Take 5 mg by mouth every 4 (four) hours as needed. For pain.      Marland Kitchen PARoxetine (PAXIL) 10 MG tablet Take 1 tablet (10 mg total) by mouth daily.  90 tablet  3  . potassium chloride SA (K-DUR,KLOR-CON) 20 MEQ tablet Take 20 mEq by mouth 2 (two) times daily.        . simvastatin (ZOCOR) 40 MG tablet Take 1 tablet (40 mg total) by mouth daily.  90 tablet  0  . atenolol (TENORMIN) 25 MG tablet Take 1 tablet (25 mg total) by mouth daily.  90 tablet  3  . omeprazole (PRILOSEC) 20 MG capsule Take 1 capsule (20 mg total) by mouth daily.  30 capsule  1    Allergies  Allergen Reactions  . Guaifenesin-Codeine     REACTION: feels spacey  . Bupropion Palpitations    Family History  Problem Relation  Age of Onset  . Heart disease Mother   . Dementia Father   . Allergies Sister   . Asthma Son   . Breast cancer Maternal Aunt   . Asthma Other   . Cancer Cousin     colon    BP 102/70  Pulse 78  Temp 98.3 F (36.8 C) (Oral)  SpO2 97%  Review of Systems Denies sob and fever.      Objective:   Physical Exam VITAL SIGNS:  See vs page GENERAL: no distress head: no deformity eyes: no periorbital swelling, no proptosis external nose and ears are normal mouth: no lesion seen Both tm's are slightly red LUNGS:  Clear to auscultation HEART:  Regular rate and rhythm without murmurs noted. Normal S1,S2.        Assessment & Plan:  Acute bronchitis, new Ceftriaxone 500 mg IM

## 2012-04-17 ENCOUNTER — Telehealth: Payer: Self-pay | Admitting: Internal Medicine

## 2012-04-17 MED ORDER — POTASSIUM CHLORIDE CRYS ER 20 MEQ PO TBCR: 20.0000 meq | EXTENDED_RELEASE_TABLET | Freq: Two times a day (BID) | ORAL | Status: DC

## 2012-04-17 NOTE — Telephone Encounter (Signed)
Notified pt rx sent to pharmacy... 04/17/12@12 :05pm

## 2012-04-17 NOTE — Telephone Encounter (Signed)
Request refill on potassium be sent to [facility] [place]

## 2012-05-09 ENCOUNTER — Telehealth: Payer: Self-pay | Admitting: Internal Medicine

## 2012-05-09 ENCOUNTER — Encounter: Payer: Self-pay | Admitting: Internal Medicine

## 2012-05-09 ENCOUNTER — Ambulatory Visit (INDEPENDENT_AMBULATORY_CARE_PROVIDER_SITE_OTHER): Admitting: Internal Medicine

## 2012-05-09 VITALS — BP 108/72 | HR 73 | Temp 97.8°F | Ht 67.0 in | Wt 260.0 lb

## 2012-05-09 DIAGNOSIS — J019 Acute sinusitis, unspecified: Secondary | ICD-10-CM

## 2012-05-09 DIAGNOSIS — K219 Gastro-esophageal reflux disease without esophagitis: Secondary | ICD-10-CM

## 2012-05-09 DIAGNOSIS — R609 Edema, unspecified: Secondary | ICD-10-CM

## 2012-05-09 DIAGNOSIS — R600 Localized edema: Secondary | ICD-10-CM | POA: Insufficient documentation

## 2012-05-09 DIAGNOSIS — F339 Major depressive disorder, recurrent, unspecified: Secondary | ICD-10-CM

## 2012-05-09 DIAGNOSIS — I4891 Unspecified atrial fibrillation: Secondary | ICD-10-CM

## 2012-05-09 DIAGNOSIS — R062 Wheezing: Secondary | ICD-10-CM

## 2012-05-09 MED ORDER — PREDNISONE 10 MG PO TABS: 10.0000 mg | ORAL_TABLET | Freq: Every day | ORAL | Status: DC

## 2012-05-09 MED ORDER — CEPHALEXIN 500 MG PO CAPS: 500.0000 mg | ORAL_CAPSULE | Freq: Four times a day (QID) | ORAL | Status: AC

## 2012-05-09 MED ORDER — OMEPRAZOLE 20 MG PO CPDR: 20.0000 mg | DELAYED_RELEASE_CAPSULE | Freq: Every day | ORAL | Status: DC

## 2012-05-09 MED ORDER — PAROXETINE HCL 10 MG PO TABS: 10.0000 mg | ORAL_TABLET | Freq: Every day | ORAL | Status: DC

## 2012-05-09 MED ORDER — LEVOCETIRIZINE DIHYDROCHLORIDE 5 MG PO TABS: 5.0000 mg | ORAL_TABLET | Freq: Every evening | ORAL | Status: DC

## 2012-05-09 MED ORDER — FEXOFENADINE HCL 180 MG PO TABS: 180.0000 mg | ORAL_TABLET | Freq: Every day | ORAL | Status: DC

## 2012-05-09 MED ORDER — FUROSEMIDE 40 MG PO TABS: 40.0000 mg | ORAL_TABLET | Freq: Two times a day (BID) | ORAL | Status: DC

## 2012-05-09 MED ORDER — ATENOLOL 25 MG PO TABS: 25.0000 mg | ORAL_TABLET | Freq: Every day | ORAL | Status: DC

## 2012-05-09 MED ORDER — ALPRAZOLAM 1 MG PO TABS: 1.0000 mg | ORAL_TABLET | Freq: Two times a day (BID) | ORAL | Status: DC | PRN

## 2012-05-09 MED ORDER — FLUTICASONE PROPIONATE 50 MCG/ACT NA SUSP: 1.0000 | Freq: Two times a day (BID) | NASAL | Status: DC

## 2012-05-09 NOTE — Telephone Encounter (Signed)
Message copied by Corwin Levins on Tue May 09, 2012  3:50 PM ------      Message from: Scharlene Gloss B      Created: Tue May 09, 2012  3:45 PM       The patient would like something sent in to the pharmacy to replace allegra OTC.

## 2012-05-09 NOTE — Assessment & Plan Note (Signed)
Very mild, for low dose short course prednisone asd

## 2012-05-09 NOTE — Progress Notes (Signed)
Subjective:    Patient ID: Erica [name], female    DOB: 1956/10/11, 56 y.o.   MRN: [id number]  HPI  Pt of Dr Bayard Hugger who ran out of meds, has not been seen recently due to insurance, and needs med refills so here today;   Here with 3 days acute onset fever, facial pain, pressure, general weakness and malaise, and greenish d/c, with slight ST, but little to no cough and Pt denies chest pain, increased sob or doe, orthopnea, PND, palpitations, dizziness or syncope, but has had increased LE edema despite the 40 mg lasix, also with mild wheezes as well for unclear reason. Has known hx of proteinuria, hx of afib. Needs mult med refills.  Denies worsening depressive symptoms, suicidal ideation, or panic, though has ongoing anxiety and ongoing stressors.  Wants to avoid any expense today if possible due to finances Past Medical History  Diagnosis Date  . Hypertension   . ALLERGIC RHINITIS   . Sleep apnea     CPAP hs  . PAF (paroxysmal atrial fibrillation)   . Dyslipidemia   . Panic attacks     Hx of depression  . Osteoarthritis   . Obesity    Past Surgical History  Procedure Date  . Right total knee surgery   . Abdominal hysterectomy   . Right oophorectomy     No cancer  . Child birth     x's 3    reports that she has never smoked. She does not have any smokeless tobacco history on file. She reports that she does not drink alcohol or use illicit drugs. family history includes Allergies in her sister; Asthma in her other and son; Breast cancer in her maternal aunt; Cancer in her cousin; Dementia in her father; and Heart disease in her mother. Allergies  Allergen Reactions  . Guaifenesin-Codeine     REACTION: feels spacey  . Bupropion Palpitations   Current Outpatient Prescriptions on File Prior to Visit  Medication Sig Dispense Refill  . cyclobenzaprine (FLEXERIL) 10 MG tablet Take 10 mg by mouth 3 (three) times daily as needed. For pain.      Marland Kitchen guaiFENesin (MUCINEX) 600 MG 12 hr  tablet Take 2 tablets (1,200 mg total) by mouth 2 (two) times daily.      Marland Kitchen oxycodone (OXY-IR) 5 MG capsule Take 5 mg by mouth every 4 (four) hours as needed. For pain.      . potassium chloride SA (K-DUR,KLOR-CON) 20 MEQ tablet Take 1 tablet (20 mEq total) by mouth 2 (two) times daily.  60 tablet  5  . simvastatin (ZOCOR) 40 MG tablet Take 1 tablet (40 mg total) by mouth daily.  90 tablet  0  . DISCONTD: ALPRAZolam (XANAX) 1 MG tablet Take 1 tablet (1 mg total) by mouth 2 (two) times daily as needed for sleep or anxiety.  180 tablet  1  . DISCONTD: fluticasone (FLONASE) 50 MCG/ACT nasal spray 1 spray by Nasal route 2 (two) times daily.        Marland Kitchen DISCONTD: furosemide (LASIX) 20 MG tablet TAKE 1 TABLET BY MOUTH EVERY EVENING  90 tablet  0  . DISCONTD: furosemide (LASIX) 40 MG tablet Take 1 tablet (40 mg total) by mouth every morning.  90 tablet  1  . DISCONTD: PARoxetine (PAXIL) 10 MG tablet Take 1 tablet (10 mg total) by mouth daily.  90 tablet  3  . levocetirizine (XYZAL) 5 MG tablet Take 1 tablet (5 mg total) by mouth every evening.  30 tablet  11  . DISCONTD: atenolol (TENORMIN) 25 MG tablet Take 1 tablet (25 mg total) by mouth daily.  90 tablet  3  . DISCONTD: omeprazole (PRILOSEC) 20 MG capsule Take 1 capsule (20 mg total) by mouth daily.  30 capsule  1   Review of Systems All otherwise neg per pt     Objective:   Physical Exam BP 108/72  Pulse 73  Temp 97.8 F (36.6 C) (Oral)  Ht 5\' 7"  (1.702 m)  Wt 260 lb (117.935 kg)  BMI 40.72 kg/m2  SpO2 98% Physical Exam  VS noted, mild ill Constitutional: Pt appears well-developed and well-nourished.  HENT: Head: Normocephalic.  Right Ear: External ear normal.  Left Ear: External ear normal.  Bilat tm's mild erythema.  Sinus tender bilat.  Pharynx mild erythema Eyes: Conjunctivae and EOM are normal. Pupils are equal, round, and reactive to light.  Neck: Normal range of motion. Neck supple.  Cardiovascular: Normal rate and regular rhythm.    Pulmonary/Chest: Effort normal and breath sounds with bilat few wheezes.  Neurological: Pt is alert. Not confused Skin: Skin is warm. No erythema. LE with 1-2+ edema to knees bilat Psychiatric: Pt behavior is normal. Thought content normal. 1-2+ nervous, not depressed affect    Assessment & Plan:

## 2012-05-09 NOTE — Assessment & Plan Note (Signed)
Ok to incr the lasix to 40 bid (from 40am, 20 pm), ideally should f/u with PCP in 3 wks

## 2012-05-09 NOTE — Patient Instructions (Addendum)
Take all new medications as prescribed - the antibiotic (cephalexin) You can also take Delsym OTC for cough, and/or Mucinex (or it's generic off brand) for congestion OK to increase the lasix to 40 mg twice per day (sent to the pharmacy) Continue all other medications as before Your refills were done as requested, and the alprazolam is given in the hardcopy Please see Dr Felicity Coyer in follow up for the swelling in 2-3 wks if you can

## 2012-05-09 NOTE — Assessment & Plan Note (Signed)
Mild to mod, for antibx course,  to f/u any worsening symptoms or concerns 

## 2012-05-09 NOTE — Assessment & Plan Note (Signed)
Cont current meds.

## 2012-05-09 NOTE — Telephone Encounter (Signed)
Done erx 

## 2012-07-13 ENCOUNTER — Other Ambulatory Visit (INDEPENDENT_AMBULATORY_CARE_PROVIDER_SITE_OTHER)

## 2012-07-13 ENCOUNTER — Ambulatory Visit (INDEPENDENT_AMBULATORY_CARE_PROVIDER_SITE_OTHER): Admitting: Internal Medicine

## 2012-07-13 ENCOUNTER — Encounter: Payer: Self-pay | Admitting: Internal Medicine

## 2012-07-13 VITALS — BP 124/78 | HR 74 | Temp 97.3°F | Ht 67.0 in | Wt 261.0 lb

## 2012-07-13 DIAGNOSIS — Z0000 Encounter for general adult medical examination without abnormal findings: Secondary | ICD-10-CM

## 2012-07-13 DIAGNOSIS — M5432 Sciatica, left side: Secondary | ICD-10-CM

## 2012-07-13 DIAGNOSIS — M5430 Sciatica, unspecified side: Secondary | ICD-10-CM

## 2012-07-13 DIAGNOSIS — E785 Hyperlipidemia, unspecified: Secondary | ICD-10-CM

## 2012-07-13 LAB — LIPID PANEL
Cholesterol: 155 mg/dL (ref 0–200)
HDL: 40.1 mg/dL (ref >39.00)
LDL Cholesterol: 92 mg/dL (ref 0–99)
Total CHOL/HDL Ratio: 4
Triglycerides: 117 mg/dL (ref 0.0–149.0)
VLDL: 23.4 mg/dL (ref 0.0–40.0)

## 2012-07-13 LAB — URINALYSIS, ROUTINE W REFLEX MICROSCOPIC
Bilirubin Urine: NEGATIVE
Hgb urine dipstick: NEGATIVE
Ketones, ur: NEGATIVE
Leukocytes, UA: NEGATIVE
Nitrite: NEGATIVE
Specific Gravity, Urine: 1.015 (ref 1.000–1.030)
Total Protein, Urine: NEGATIVE
Urine Glucose: NEGATIVE
Urobilinogen, UA: 1 (ref 0.0–1.0)
pH: 7.5 (ref 5.0–8.0)

## 2012-07-13 LAB — HEPATIC FUNCTION PANEL
ALT: 13 U/L (ref 0–35)
AST: 17 U/L (ref 0–37)
Albumin: 3.7 g/dL (ref 3.5–5.2)
Alkaline Phosphatase: 140 U/L — ABNORMAL HIGH (ref 39–117)
Bilirubin, Direct: 0.1 mg/dL (ref 0.0–0.3)
Total Bilirubin: 0.7 mg/dL (ref 0.3–1.2)
Total Protein: 7.4 g/dL (ref 6.0–8.3)

## 2012-07-13 LAB — CBC WITH DIFFERENTIAL/PLATELET
Basophils Absolute: 0 10*3/uL (ref 0.0–0.1)
Basophils Relative: 0.5 % (ref 0.0–3.0)
Eosinophils Absolute: 0.1 10*3/uL (ref 0.0–0.7)
Eosinophils Relative: 1.3 % (ref 0.0–5.0)
HCT: 37.5 % (ref 36.0–46.0)
Hemoglobin: 12.1 g/dL (ref 12.0–15.0)
Lymphocytes Relative: 28.3 % (ref 12.0–46.0)
Lymphs Abs: 2.2 10*3/uL (ref 0.7–4.0)
MCHC: 32.1 g/dL (ref 30.0–36.0)
MCV: 85.8 fl (ref 78.0–100.0)
Monocytes Absolute: 0.7 10*3/uL (ref 0.1–1.0)
Monocytes Relative: 9.4 % (ref 3.0–12.0)
Neutro Abs: 4.7 10*3/uL (ref 1.4–7.7)
Neutrophils Relative %: 60.5 % (ref 43.0–77.0)
Platelets: 245 10*3/uL (ref 150.0–400.0)
RBC: 4.37 Mil/uL (ref 3.87–5.11)
RDW: 15 % — ABNORMAL HIGH (ref 11.5–14.6)
WBC: 7.8 10*3/uL (ref 4.5–10.5)

## 2012-07-13 LAB — BASIC METABOLIC PANEL
BUN: 16 mg/dL (ref 6–23)
CO2: 28 mEq/L (ref 19–32)
Calcium: 9 mg/dL (ref 8.4–10.5)
Chloride: 106 mEq/L (ref 96–112)
Creatinine, Ser: 0.7 mg/dL (ref 0.4–1.2)
GFR: 108.5 mL/min (ref >60.00)
Glucose, Bld: 114 mg/dL — ABNORMAL HIGH (ref 70–99)
Potassium: 3.5 mEq/L (ref 3.5–5.1)
Sodium: 141 mEq/L (ref 135–145)

## 2012-07-13 LAB — TSH: TSH: 3.27 u[IU]/mL (ref 0.35–5.50)

## 2012-07-13 MED ORDER — SIMVASTATIN 40 MG PO TABS: 40.0000 mg | ORAL_TABLET | Freq: Every day | ORAL | Status: DC

## 2012-07-13 MED ORDER — MELOXICAM 15 MG PO TABS: 15.0000 mg | ORAL_TABLET | Freq: Every day | ORAL | Status: DC

## 2012-07-13 MED ORDER — CYCLOBENZAPRINE HCL 10 MG PO TABS: 10.0000 mg | ORAL_TABLET | Freq: Three times a day (TID) | ORAL | Status: DC | PRN

## 2012-07-13 NOTE — Assessment & Plan Note (Signed)
Wt Readings from Last 3 Encounters:  07/13/12 261 lb (118.389 kg)  05/09/12 260 lb (117.935 kg)  10/20/11 241 lb 12.8 oz (109.68 kg)   The patient is asked to make an attempt to improve diet and exercise patterns to aid in medical management of this problem.

## 2012-07-13 NOTE — Progress Notes (Signed)
Subjective:    Patient ID: Erica [name], female    DOB: 1956-07-25, 56 y.o.   MRN: [id number]  HPI  Here for medicare wellness  Diet: heart healthy Physical activity: sedentary Depression/mood screen: negative Hearing: intact to whispered voice Visual acuity: grossly normal, performs annual eye exam  ADLs: capable Fall risk: none Home safety: good Cognitive evaluation: intact to orientation, naming, recall and repetition EOL planning: adv directives, full code/ I agree  I have personally reviewed and have noted 1. The patient's medical and social history 2. Their use of alcohol, tobacco or illicit drugs 3. Their current medications and supplements 4. The patient's functional ability including ADL's, fall risks, home safety risks and hearing or visual impairment. 5. Diet and physical activities 6. Evidence for depression or mood disorders  also reviewed chronic medical issues:  GERD symptoms -  Improved with PPI, but inconsistent use to cost  Constipation, chronic  -follows intermittently GI for same  Chronic bloating but no abdominal pain or rectal bleeding -  very small hard bowel "pellets" and gas, no melena or diarrhea -  reports no improvement symptoms with milk of mag or sennakot plus -   OSA - sleep study 11/2009 -intermittently working with pulm on same, variable compliance with CPAP - Pt still complains of fatigue   HTN - reports compliance with ongoing medical treatment and no changes in medication dose or frequency. denies adverse side effects related to current therapy. chronic trace edema, no chest pain, headache, weakness or vison changes   dyslipidemia - on simvastatin -reports compliance with ongoing medical treatment and no changes in medication dose or frequency. denies adverse side effects related to current therapy. no GI or muscle pains   back pain, chronic - associated with L sciatica - previously follows with physiatrist ramos for same, but no  longer there due to cost/$$ concerns - periodic steroid shots in back with improvement  Depression/anxiety - uses schedule xanax to control same, also on paxil (since 12/12) ER visit 09/15/11 for "reaction" to generic wellbutrin: palpitations and increase anxiety remotely on citalopram daily but caused side effects  -  Stress with family (mom ill, g-dtr), $$ and marriage problems precipitated symptoms  s/p counseling 10/2010 (dr Noe Gens) - on hold due to $$ no SI/HI    Past Medical History  Diagnosis Date  . Hypertension   . ALLERGIC RHINITIS   . Sleep apnea     CPAP hs  . PAF (paroxysmal atrial fibrillation)   . Dyslipidemia   . Panic attacks     Hx of depression  . Osteoarthritis   . Obesity    Family History  Problem Relation Age of Onset  . Heart disease Mother   . Dementia Father   . Allergies Sister   . Asthma Son   . Breast cancer Maternal Aunt   . Asthma Other   . Cancer Cousin     colon   History  Substance Use Topics  . Smoking status: Never Smoker   . Smokeless tobacco: Not on file   Comment: Married with children. 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 Alcohol Use: No   Review of Systems  Constitutional: Positive for fatigue. Negative for fever and unexpected weight change.  Cardiovascular: Negative for chest pain and leg swelling.  Gastrointestinal: Negative for nausea, vomiting and abdominal pain.   Musculoskeletal: Negative for change in gait problem or joint swelling.  Skin: Negative for rash.  Neurological: Negative for  dizziness or headache.  No other specific complaints in a complete review of systems (except as listed in HPI above).      Objective:   Physical Exam  BP 124/78  Pulse 74  Temp 97.3 F (36.3 C) (Oral)  Ht 5\' 7"  (1.702 m)  Wt 261 lb (118.389 kg)  BMI 40.88 kg/m2  SpO2 97% Wt Readings from Last 3 Encounters:  07/13/12 261 lb (118.389 kg)  05/09/12 260 lb (117.935 kg)  10/20/11 241 lb 12.8 oz (109.68 kg)    Constitutional: She is overweight, but appears well-developed and well-nourished. No distress.  HENT: Head: Normocephalic and atraumatic. Ears: B TMs ok, no erythema or effusion; Nose: Nose normal. Mouth/Throat: Oropharynx is clear and moist. No oropharyngeal exudate.  Eyes: Conjunctivae and EOM are normal. Pupils are equal, round, and reactive to light. No scleral icterus.  Neck: Normal range of motion. Neck supple. No JVD present. No thyromegaly present.  Cardiovascular: Normal rate, regular rhythm and normal heart sounds.  No murmur heard. No BLE edema. Pulmonary/Chest: Effort normal and breath sounds normal. No respiratory distress. She has no wheezes.  Abdominal: Soft. Bowel sounds are normal. She exhibits no distension. There is no tenderness. no masses Musculoskeletal: Back: full range of motion of thoracic and lumbar spine. Non tender to palpation. Positive ipsilateral straight leg raise. DTR's are symmetrically intact. Sensation intact in all dermatomes of the lower extremities. Full strength to manual muscle testing. patient is able to heel toe walk without difficulty and ambulates with antalgic gait. Neurological: She is alert and oriented to person, place, and time. No cranial nerve deficit. Coordination normal.  Skin: Skin is warm and dry. No rash noted. No erythema.  Psychiatric: She has a normal mood and affect. Her behavior is normal. Judgment and thought content normal.    Lab Results  Component Value Date   WBC 7.0 01/04/2011   HGB 13.9 09/15/2011   HCT 41.0 09/15/2011   PLT 227.0 01/04/2011   CHOL 167 07/12/2011   TRIG 83.0 07/12/2011   HDL 46.60 07/12/2011   ALT 12 07/12/2011   AST 15 07/12/2011   NA 139 09/15/2011   K 3.8 09/15/2011   CL 101 09/15/2011   CREATININE 0.90 09/15/2011   BUN 22 09/15/2011   CO2 29 06/29/2010   TSH 1.81 01/04/2011   HGBA1C 6.2 01/23/2009        Assessment & Plan:  AWV/CPX - Today patient counseled on age appropriate routine health concerns for  screening and prevention, each reviewed and up to date or declined. Immunizations reviewed and up to date or declined. Labs reviewed. Risk factors for depression reviewed and negative. Hearing function and visual acuity are intact. ADLs screened and addressed as needed. Functional ability and level of safety reviewed and appropriate. Education, counseling and referrals performed based on assessed risks today. Patient provided with a copy of personalized plan for preventive services.  Also see problem list. Medications and labs reviewed today.

## 2012-07-13 NOTE — Assessment & Plan Note (Signed)
The current medical regimen is effective (simva, fish oil);  continue present plan and medications. Check labs annually Lab Results  Component Value Date   LDLCALC 104* 07/12/2011   Lab Results  Component Value Date   ALT 12 07/12/2011   AST 15 07/12/2011   ALKPHOS 153* 07/12/2011   BILITOT 0.3 07/12/2011

## 2012-07-13 NOTE — Patient Instructions (Addendum)
It was good to see you today. Health Maintenance reviewed - all recommended immunizations and age-appropriate screenings are up-to-date or declined. Test(s) ordered today. Your results will be released to MyChart (or called to you) after review, usually within 72hours after test completion. If any changes need to be made, you will be notified at that same time. Medications reviewed, no changes at this time. Use meloxicam once daily for back pain in addition to muscle relaxers (refills today) - Your prescription(s) have been submitted to your pharmacy. Please take as directed and contact our office if you believe you are having problem(s) with the medication(s). Follow up with her pain specialist if refills on OxyCodone related Work on lifestyle changes as discussed (low fat, low carb, increased protein diet; improved exercise efforts; weight loss) to control sugar, blood pressure and cholesterol levels and/or reduce risk of developing other medical problems. Look into [email or web address] or other type of food journal to assist you in this process. Please schedule followup in 6 months for blood pressure and cholesterol check, call sooner if problems. Back Exercises Back exercises help treat and prevent back injuries. The goal of back exercises is to increase the strength of your abdominal and back muscles and the flexibility of your back. These exercises should be started when you no longer have back pain. Back exercises include:  Pelvic Tilt. Lie on your back with your knees bent. Tilt your pelvis until the lower part of your back is against the floor. Hold this position 5 to 10 sec and repeat 5 to 10 times.   Knee to Chest. Pull first 1 knee up against your chest and hold for 20 to 30 seconds, repeat this with the other knee, and then both knees. This may be done with the other leg straight or bent, whichever feels better.   Sit-Ups or Curl-Ups. Bend your knees 90 degrees. Start with tilting your  pelvis, and do a partial, slow sit-up, lifting your trunk only 30 to 45 degrees off the floor. Take at least 2 to 3 seconds for each sit-up. Do not do sit-ups with your knees out straight. If partial sit-ups are difficult, simply do the above but with only tightening your abdominal muscles and holding it as directed.   Hip-Lift. Lie on your back with your knees flexed 90 degrees. Push down with your feet and shoulders as you raise your hips a couple inches off the floor; hold for 10 seconds, repeat 5 to 10 times.   Back arches. Lie on your stomach, propping yourself up on bent elbows. Slowly press on your hands, causing an arch in your low back. Repeat 3 to 5 times. Any initial stiffness and discomfort should lessen with repetition over time.   Shoulder-Lifts. Lie face down with arms beside your body. Keep hips and torso pressed to floor as you slowly lift your head and shoulders off the floor.  Do not overdo your exercises, especially in the beginning. Exercises may cause you some mild back discomfort which lasts for a few minutes; however, if the pain is more severe, or lasts for more than 15 minutes, do not continue exercises until you see your caregiver. Improvement with exercise therapy for back problems is slow.   See your caregivers for assistance with developing a proper back exercise program. Document Released: 11/04/2004 Document Revised: 12/20/2011 Document Reviewed: 07/29/2011 [facility] Patient Information 2013 [place], [place].

## 2012-07-13 NOTE — Assessment & Plan Note (Signed)
Chronic lumbar back pain and intermittent sciatica symptoms, periodic ESI with improvement, but currently without insurance coverage for same I declined to refill patient's OxyContin -may followup with pain management specialist on same as needed -patient will let me know if referral is needed Meloxicam once daily prescribed and back exercises provided No neuro deficits on exam today

## 2012-08-22 ENCOUNTER — Other Ambulatory Visit: Payer: Self-pay | Admitting: Internal Medicine

## 2012-08-23 NOTE — Telephone Encounter (Signed)
Faxed script back to [facility].../lmb 

## 2012-09-04 ENCOUNTER — Telehealth: Payer: Self-pay | Admitting: *Deleted

## 2012-09-04 MED ORDER — POTASSIUM CHLORIDE CRYS ER 20 MEQ PO TBCR: 20.0000 meq | EXTENDED_RELEASE_TABLET | Freq: Two times a day (BID) | ORAL | Status: DC

## 2012-09-04 NOTE — Telephone Encounter (Signed)
Left msg on vm stating needing rx for her potassium sent to [facility]. [facility] is too expensive...lmb

## 2012-09-04 NOTE — Telephone Encounter (Signed)
Called pt back to verify which [facility]. Want sent to [facility]. Inform will send...lmb

## 2012-09-08 ENCOUNTER — Other Ambulatory Visit: Payer: Self-pay

## 2012-09-08 ENCOUNTER — Ambulatory Visit (INDEPENDENT_AMBULATORY_CARE_PROVIDER_SITE_OTHER): Admitting: Internal Medicine

## 2012-09-08 ENCOUNTER — Encounter: Payer: Self-pay | Admitting: Internal Medicine

## 2012-09-08 VITALS — BP 110/72 | HR 85 | Temp 98.0°F | Resp 16 | Wt 256.5 lb

## 2012-09-08 DIAGNOSIS — J019 Acute sinusitis, unspecified: Secondary | ICD-10-CM

## 2012-09-08 DIAGNOSIS — R002 Palpitations: Secondary | ICD-10-CM

## 2012-09-08 DIAGNOSIS — R9431 Abnormal electrocardiogram [ECG] [EKG]: Secondary | ICD-10-CM

## 2012-09-08 DIAGNOSIS — J309 Allergic rhinitis, unspecified: Secondary | ICD-10-CM

## 2012-09-08 DIAGNOSIS — I4891 Unspecified atrial fibrillation: Secondary | ICD-10-CM

## 2012-09-08 MED ORDER — AMOXICILLIN-POT CLAVULANATE 875-125 MG PO TABS: 1.0000 | ORAL_TABLET | Freq: Two times a day (BID) | ORAL | Status: AC

## 2012-09-08 MED ORDER — METHYLPREDNISOLONE ACETATE 80 MG/ML IJ SUSP
120.0000 mg | Freq: Once | INTRAMUSCULAR | Status: AC
  Administered 2012-09-08: 120 mg via INTRAMUSCULAR

## 2012-09-08 MED ORDER — AMOXICILLIN-POT CLAVULANATE 875-125 MG PO TABS: 1.0000 | ORAL_TABLET | Freq: Two times a day (BID) | ORAL | Status: DC

## 2012-09-08 NOTE — Progress Notes (Signed)
Subjective:    Patient ID: Erica [name], female    DOB: [birth year], 56 y.o.   MRN: [id number]  Sinusitis This is a new problem. The current episode started in the past 7 days. The problem has been gradually worsening since onset. There has been no fever. The fever has been present for less than 1 day. Her pain is at a severity of 0/10. She is experiencing no pain. Associated symptoms include congestion, sinus pressure, sneezing and a sore throat. Pertinent negatives include no chills, coughing, diaphoresis, ear pain, headaches, hoarse voice, neck pain, shortness of breath or swollen glands. Treatments tried: flonase.  Palpitations  This is a recurrent problem. The current episode started more than 1 year ago. The problem occurs intermittently. The problem has been unchanged. On average, each episode lasts 2 minutes. The symptoms are aggravated by fear and stress (she tells me that they only occur in the evening). Associated symptoms include anxiety and an irregular heartbeat. Pertinent negatives include no chest fullness, chest pain, coughing, diaphoresis, dizziness, fever, malaise/fatigue, nausea, numbness, shortness of breath, syncope, vomiting or weakness. She has tried anxiolytics and beta blockers for the symptoms. The treatment provided moderate relief. Risk factors include obesity, post menopause, sedentary lifestyle, stress and dyslipidemia. Her past medical history is significant for anxiety. There is no history of anemia, drug use, heart disease, hyperthyroidism or a valve disorder.      Review of Systems  Constitutional: Negative for fever, chills, malaise/fatigue, diaphoresis, activity change, appetite change and unexpected weight change.  HENT: Positive for congestion, sore throat, rhinorrhea, sneezing, postnasal drip and sinus pressure. Negative for hearing loss, ear pain, nosebleeds, hoarse voice, facial swelling, drooling, mouth sores, trouble swallowing, neck pain, neck stiffness,  dental problem, tinnitus and ear discharge.   Eyes: Negative.   Respiratory: Negative for apnea, cough, choking, chest tightness, shortness of breath, wheezing and stridor.   Cardiovascular: Positive for palpitations. Negative for chest pain, leg swelling and syncope.  Gastrointestinal: Negative for nausea, vomiting, abdominal pain, diarrhea and constipation.  Genitourinary: Negative.   Musculoskeletal: Negative.   Skin: Negative.   Neurological: Negative for dizziness, tremors, seizures, syncope, facial asymmetry, speech difficulty, weakness, light-headedness, numbness and headaches.  Hematological: Negative for adenopathy. Does not bruise/bleed easily.  Psychiatric/Behavioral: Negative.        Objective:   Physical Exam  Vitals reviewed. Constitutional: She is oriented to person, place, and time. She appears well-developed and well-nourished.  Non-toxic appearance. She does not have a sickly appearance. She does not appear ill. No distress.  HENT:  Head: Normocephalic. No trismus in the jaw.  Right Ear: Hearing, tympanic membrane, external ear and ear canal normal.  Left Ear: Hearing, tympanic membrane, external ear and ear canal normal.  Nose: Mucosal edema and rhinorrhea present. No sinus tenderness.  No foreign bodies. Right sinus exhibits maxillary sinus tenderness. Right sinus exhibits no frontal sinus tenderness. Left sinus exhibits maxillary sinus tenderness. Left sinus exhibits no frontal sinus tenderness.  Mouth/Throat: Mucous membranes are normal. Mucous membranes are not pale, not dry and not cyanotic. No uvula swelling. Posterior oropharyngeal erythema present. No oropharyngeal exudate, posterior oropharyngeal edema or tonsillar abscesses.  Eyes: Conjunctivae normal are normal. Right eye exhibits no discharge. Left eye exhibits no discharge. No scleral icterus.  Neck: Normal range of motion. Neck supple. No JVD present. No tracheal deviation present. No thyromegaly present.    Cardiovascular: Normal rate, regular rhythm, normal heart sounds and intact distal pulses.  Exam reveals no gallop and no friction  rub.   No murmur heard. Pulmonary/Chest: Effort normal and breath sounds normal. No stridor. No respiratory distress. She has no wheezes. She has no rales. She exhibits no tenderness.  Abdominal: Soft. Bowel sounds are normal. She exhibits no distension and no mass. There is no tenderness. There is no rebound and no guarding.  Musculoskeletal: Normal range of motion. She exhibits no edema and no tenderness.  Lymphadenopathy:    She has no cervical adenopathy.  Neurological: She is oriented to person, place, and time.  Skin: Skin is warm and dry. No rash noted. She is not diaphoretic. No erythema. No pallor.  Psychiatric: She has a normal mood and affect. Her behavior is normal. Judgment and thought content normal.      Lab Results  Component Value Date   WBC 7.8 07/13/2012   HGB 12.1 07/13/2012   HCT 37.5 07/13/2012   PLT 245.0 07/13/2012   GLUCOSE 114* 07/13/2012   CHOL 155 07/13/2012   TRIG 117.0 07/13/2012   HDL 40.10 07/13/2012   LDLCALC 92 07/13/2012   ALT 13 07/13/2012   AST 17 07/13/2012   NA 141 07/13/2012   K 3.5 07/13/2012   CL 106 07/13/2012   CREATININE 0.7 07/13/2012   BUN 16 07/13/2012   CO2 28 07/13/2012   TSH 3.27 07/13/2012   HGBA1C 6.2 01/23/2009      Assessment & Plan:

## 2012-09-08 NOTE — Assessment & Plan Note (Signed)
Her EKG today shows NSR but her p waves are large in II and V1, also there is mildly poor RWP in V1-V6. There are no acute findings but I have asked her to see cardiology to consider an ECHO and for further evaluation.

## 2012-09-08 NOTE — Assessment & Plan Note (Signed)
I have asked her to get an event monitor done and to be seen by cardiology

## 2012-09-08 NOTE — Assessment & Plan Note (Signed)
Due to the severity of her symptoms she was given an injection of depo-medrol IM today

## 2012-09-08 NOTE — Assessment & Plan Note (Signed)
Will treat with augmentin

## 2012-09-08 NOTE — Patient Instructions (Signed)
Palpitations  A palpitation is the feeling that your heartbeat is irregular or is faster than normal. It may feel like your heart is fluttering or skipping a beat. Palpitations are usually not a serious problem. However, in some cases, you may need further medical evaluation. CAUSES  Palpitations can be caused by:  Smoking.  Caffeine or other stimulants, such as diet pills or energy drinks.  Alcohol.  Stress and anxiety.  Strenuous physical activity.  Fatigue.  Certain medicines.  Heart disease, especially if you have a history of arrhythmias. This includes atrial fibrillation, atrial flutter, or supraventricular tachycardia.  An improperly working pacemaker or defibrillator. DIAGNOSIS  To find the cause of your palpitations, your caregiver will take your history and perform a physical exam. Tests may also be done, including:  Electrocardiography (ECG). This test records the heart's electrical activity.  Cardiac monitoring. This allows your caregiver to monitor your heart rate and rhythm in real time.  Holter monitor. This is a portable device that records your heartbeat and can help diagnose heart arrhythmias. It allows your caregiver to track your heart activity for several days, if needed.  Stress tests by exercise or by giving medicine that makes the heart beat faster. TREATMENT  Treatment of palpitations depends on the cause of your symptoms and can vary greatly. Most cases of palpitations do not require any treatment other than time, relaxation, and monitoring your symptoms. Other causes, such as atrial fibrillation, atrial flutter, or supraventricular tachycardia, usually require further treatment. HOME CARE INSTRUCTIONS   Avoid:  Caffeinated coffee, tea, soft drinks, diet pills, and energy drinks.  Chocolate.  Alcohol.  Stop smoking if you smoke.  Reduce your stress and anxiety. Things that can help you relax include:  A method that measures bodily functions so  you can learn to control them (biofeedback).  Yoga.  Meditation.  Physical activity such as swimming, jogging, or walking.  Get plenty of rest and sleep. SEEK MEDICAL CARE IF:   You continue to have a fast or irregular heartbeat beyond 24 hours.  Your palpitations occur more often. SEEK IMMEDIATE MEDICAL CARE IF:  You develop chest pain or shortness of breath.  You have a severe headache.  You feel dizzy, or you faint. MAKE SURE YOU:  Understand these instructions.  Will watch your condition.  Will get help right away if you are not doing well or get worse. Document Released: 09/24/2000 Document Revised: 03/28/2012 Document Reviewed: 11/26/2011 [facility] Patient Information 2013 [place], [place]. Sinusitis Sinusitis is redness, soreness, and swelling (inflammation) of the paranasal sinuses. Paranasal sinuses are air pockets within the bones of your face (beneath the eyes, the middle of the forehead, or above the eyes). In healthy paranasal sinuses, mucus is able to drain out, and air is able to circulate through them by way of your nose. However, when your paranasal sinuses are inflamed, mucus and air can become trapped. This can allow bacteria and other germs to grow and cause infection. Sinusitis can develop quickly and last only a short time (acute) or continue over a long period (chronic). Sinusitis that lasts for more than 12 weeks is considered chronic.  CAUSES  Causes of sinusitis include:  Allergies.  Structural abnormalities, such as displacement of the cartilage that separates your nostrils (deviated septum), which can decrease the air flow through your nose and sinuses and affect sinus drainage.  Functional abnormalities, such as when the small hairs (cilia) that line your sinuses and help remove mucus do not work properly or  are not present. SYMPTOMS  Symptoms of acute and chronic sinusitis are the same. The primary symptoms are pain and pressure around the  affected sinuses. Other symptoms include:  Upper toothache.  Earache.  Headache.  Bad breath.  Decreased sense of smell and taste.  A cough, which worsens when you are lying flat.  Fatigue.  Fever.  Thick drainage from your nose, which often is green and may contain pus (purulent).  Swelling and warmth over the affected sinuses. DIAGNOSIS  Your caregiver will perform a physical exam. During the exam, your caregiver may:  Look in your nose for signs of abnormal growths in your nostrils (nasal polyps).  Tap over the affected sinus to check for signs of infection.  View the inside of your sinuses (endoscopy) with a special imaging device with a light attached (endoscope), which is inserted into your sinuses. If your caregiver suspects that you have chronic sinusitis, one or more of the following tests may be recommended:  Allergy tests.  Nasal culture A sample of mucus is taken from your nose and sent to a lab and screened for bacteria.  Nasal cytology A sample of mucus is taken from your nose and examined by your caregiver to determine if your sinusitis is related to an allergy. TREATMENT  Most cases of acute sinusitis are related to a viral infection and will resolve on their own within 10 days. Sometimes medicines are prescribed to help relieve symptoms (pain medicine, decongestants, nasal steroid sprays, or saline sprays).  However, for sinusitis related to a bacterial infection, your caregiver will prescribe antibiotic medicines. These are medicines that will help kill the bacteria causing the infection.  Rarely, sinusitis is caused by a fungal infection. In theses cases, your caregiver will prescribe antifungal medicine. For some cases of chronic sinusitis, surgery is needed. Generally, these are cases in which sinusitis recurs more than 3 times per year, despite other treatments. HOME CARE INSTRUCTIONS   Drink plenty of water. Water helps thin the mucus so your sinuses  can drain more easily.  Use a humidifier.  Inhale steam 3 to 4 times a day (for example, sit in the bathroom with the shower running).  Apply a warm, moist washcloth to your face 3 to 4 times a day, or as directed by your caregiver.  Use saline nasal sprays to help moisten and clean your sinuses.  Take over-the-counter or prescription medicines for pain, discomfort, or fever only as directed by your caregiver. SEEK IMMEDIATE MEDICAL CARE IF:  You have increasing pain or severe headaches.  You have nausea, vomiting, or drowsiness.  You have swelling around your face.  You have vision problems.  You have a stiff neck.  You have difficulty breathing. MAKE SURE YOU:   Understand these instructions.  Will watch your condition.  Will get help right away if you are not doing well or get worse. Document Released: 09/27/2005 Document Revised: 12/20/2011 Document Reviewed: 10/12/2011 [facility] Patient Information 2013 [place], [place].

## 2012-09-08 NOTE — Assessment & Plan Note (Signed)
EKG today shows NSR, I have asked her to get an event monitor done

## 2012-09-11 ENCOUNTER — Encounter: Payer: Self-pay | Admitting: Cardiovascular Disease

## 2012-09-11 ENCOUNTER — Ambulatory Visit (HOSPITAL_COMMUNITY): Payer: Self-pay | Attending: Cardiovascular Disease | Admitting: Radiology

## 2012-09-11 ENCOUNTER — Ambulatory Visit (INDEPENDENT_AMBULATORY_CARE_PROVIDER_SITE_OTHER): Payer: Self-pay | Admitting: Cardiovascular Disease

## 2012-09-11 VITALS — BP 137/89 | HR 67 | Wt 251.0 lb

## 2012-09-11 DIAGNOSIS — R9431 Abnormal electrocardiogram [ECG] [EKG]: Secondary | ICD-10-CM

## 2012-09-11 DIAGNOSIS — I10 Essential (primary) hypertension: Secondary | ICD-10-CM | POA: Insufficient documentation

## 2012-09-11 DIAGNOSIS — E785 Hyperlipidemia, unspecified: Secondary | ICD-10-CM | POA: Insufficient documentation

## 2012-09-11 DIAGNOSIS — R0609 Other forms of dyspnea: Secondary | ICD-10-CM | POA: Insufficient documentation

## 2012-09-11 DIAGNOSIS — R002 Palpitations: Secondary | ICD-10-CM | POA: Insufficient documentation

## 2012-09-11 DIAGNOSIS — R0989 Other specified symptoms and signs involving the circulatory and respiratory systems: Secondary | ICD-10-CM

## 2012-09-11 DIAGNOSIS — Z8249 Family history of ischemic heart disease and other diseases of the circulatory system: Secondary | ICD-10-CM | POA: Insufficient documentation

## 2012-09-11 DIAGNOSIS — F339 Major depressive disorder, recurrent, unspecified: Secondary | ICD-10-CM

## 2012-09-11 NOTE — Patient Instructions (Addendum)
Your physician recommends that you schedule a follow-up appointment in: AS NEEDED Your physician recommends that you continue on your current medications as directed. Please refer to the Current Medication list given to you today.  Your physician has requested that you have an echocardiogram. Echocardiography is a painless test that uses sound waves to create images of your heart. It provides your doctor with information about the size and shape of your heart and how well your heart's chambers and valves are working. This procedure takes approximately one hour. There are no restrictions for this procedure.  DX 785.1  Your physician has recommended that you wear an event monitor. Event monitors are medical devices that record the heart's electrical activity. Doctors most often [place] these monitors to diagnose arrhythmias. Arrhythmias are problems with the speed or rhythm of the heartbeat. The monitor is a small, portable device. You can wear one while you do your normal daily activities. This is usually used to diagnose what is causing palpitations/syncope (passing out). DX 785.1

## 2012-09-11 NOTE — Assessment & Plan Note (Signed)
Cholesterol is at goal.  Continue current dose of statin and diet Rx.  No myalgias or side effects.  F/U  LFT's in 6 months. Lab Results  Component Value Date   LDLCALC 92 07/13/2012

## 2012-09-11 NOTE — Assessment & Plan Note (Signed)
Well controlled.  Continue current medications and low sodium Dash type diet.    

## 2012-09-11 NOTE — Assessment & Plan Note (Signed)
Poor R wave progression from body habitus.  No changes from 2005 or [id number]  Echo to assess RWMA;s

## 2012-09-11 NOTE — Assessment & Plan Note (Signed)
Symptoms clearly related to this and anxiety  Continue valium

## 2012-09-11 NOTE — Assessment & Plan Note (Addendum)
I see no documentation of afib.  [place] score only 1  Event monitor ordered by primary  Continue Atenolol but take at night when symptoms worse.  No need for anticoagulation at this point.  Echo to R/O structural heart disease and see what atrial sizes are

## 2012-09-11 NOTE — Progress Notes (Signed)
Patient ID: Erica [name], female   DOB: [birth year], 56 y.o.   MRN: [id number] 56 yo sedentary overweight female referred by Dr Yetta Barre for ? afib and abnormal ECG  I reviewed her chart and see no documentation of afib.  Palpitations are a  recurrent problem. The current episode started more than 1 year ago. The problem occurs intermittently. The problem has been unchanged. On average, each episode lasts 2 minutes. The symptoms are aggravated by fear and stress (she tells me that they only occur in the evening). Associated symptoms include anxiety and an irregular heartbeat. Pertinent negatives include no chest fullness, chest pain, coughing, diaphoresis, dizziness, fever, malaise/fatigue, nausea, numbness, shortness of breath, syncope, vomiting or weakness. She has tried anxiolytics and beta blockers for the symptoms. The treatment provided moderate relief. Risk factors include obesity, post menopause, sedentary lifestyle, stress and dyslipidemia. Her past medical history is significant for anxiety. There is no history of anemia, drug use, heart disease, hyperthyroidism or a valve disorder.  Symptoms worse at night.  Takes her atenolol in am.    Reviewed her ECG;s from 09/15/11, 2009 and 09/08/12  No real changes  Poor R wave progression from body habitus.    ROS: Denies fever, malais, weight loss, blurry vision, decreased visual acuity, cough, sputum, SOB, hemoptysis, pleuritic pain, palpitaitons, heartburn, abdominal pain, melena, lower extremity edema, claudication, or rash.  All other systems reviewed and negative   General: Affect appropriate Overweight black female HEENT: normal Neck supple with no adenopathy JVP normal no bruits no thyromegaly Lungs clear with no wheezing and good diaphragmatic motion Heart:  S1/S2 no murmur,rub, gallop or click PMI normal Abdomen: benighn, BS positve, no tenderness, no AAA no bruit.  No HSM or HJR Distal pulses intact with no bruits No edema Neuro  non-focal Skin warm and dry No muscular weakness Left TKR   Medications Current Outpatient Prescriptions  Medication Sig Dispense Refill  . ALPRAZolam (XANAX) 1 MG tablet TAKE ONE TABLET BY MOUTH TWICE DAILY AS NEEDED FOR SLEEP OR ANXIETY  180 tablet  1  . amoxicillin-clavulanate (AUGMENTIN) 875-125 MG per tablet Take 1 tablet by mouth 2 (two) times daily.  20 tablet  0  . atenolol (TENORMIN) 25 MG tablet Take 1 tablet (25 mg total) by mouth daily.  90 tablet  3  . cyclobenzaprine (FLEXERIL) 10 MG tablet Take 1 tablet (10 mg total) by mouth 3 (three) times daily as needed for muscle spasms. For pain.  90 tablet  3  . fluticasone (FLONASE) 50 MCG/ACT nasal spray Place 1 spray into the nose 2 (two) times daily.  16 g  3  . furosemide (LASIX) 40 MG tablet Take 1 tablet (40 mg total) by mouth 2 (two) times daily.  180 tablet  1  . meloxicam (MOBIC) 15 MG tablet Take 1 tablet (15 mg total) by mouth daily.  30 tablet  5  . oxycodone (OXY-IR) 5 MG capsule Take 5 mg by mouth every 4 (four) hours as needed. For pain.      Marland Kitchen PARoxetine (PAXIL) 10 MG tablet Take 1 tablet (10 mg total) by mouth daily.  90 tablet  1  . potassium chloride SA (K-DUR,KLOR-CON) 20 MEQ tablet Take 1 tablet (20 mEq total) by mouth 2 (two) times daily.  60 tablet  5  . simvastatin (ZOCOR) 40 MG tablet Take 1 tablet (40 mg total) by mouth daily.  90 tablet  3  . levocetirizine (XYZAL) 5 MG tablet Take 1 tablet (5 mg  total) by mouth every evening.  30 tablet  11    Allergies Guaifenesin-codeine and Bupropion  Family History: Family History  Problem Relation Age of Onset  . Heart disease Mother   . Dementia Father   . Allergies Sister   . Asthma Son   . Breast cancer Maternal Aunt   . Asthma Other   . Cancer Cousin     colon    Social History: History   Social History  . Marital Status: Married    Spouse Name: N/A    Number of Children: N/A  . Years of Education: N/A   Occupational History  . Not on file.    Social History Main Topics  . Smoking status: Never Smoker   . Smokeless tobacco: Never Used     Comment: Married with children. 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 Alcohol Use: No  . Drug Use: No  . Sexually Active: Not Currently   Other Topics Concern  . Not on file   Social History Narrative   Married with children. 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

## 2012-09-11 NOTE — Progress Notes (Signed)
Echocardiogram performed.  

## 2012-09-14 ENCOUNTER — Telehealth: Payer: Self-pay | Admitting: *Deleted

## 2012-09-14 NOTE — Telephone Encounter (Signed)
Message copied by Leotis Pain on Thu Sep 14, 2012  4:57 PM ------      Message from: Shelbie Proctor A      Created: Thu Sep 14, 2012  2:17 PM      Regarding: need  event montior        Erica [name], Erica [name] [[id number]]       Order #: [id number] Procedure: CARDIAC EVENT MONITOR       Order Date: 09/08/2012 Proc Category: Cv Cardiac Services Orderables       Priority: Routine Class: Ancillary Performed       Standing Status: Future Expires: 09/08/2013 Status:         Ordering User: Etta Grandchild, MD [[id number]] Department: [facility] Provider: Corky Crafts Provider: Etta Grandchild, MD       Diagnosis: Atrial fibrillation      Palpitations             Sched Instruct:         Visit Types: MONITOR 30 [515]       Comment:

## 2012-09-14 NOTE — Telephone Encounter (Signed)
Sent request to [facility] to schedule pt for monitor.  

## 2012-09-18 ENCOUNTER — Telehealth: Payer: Self-pay

## 2012-09-18 MED ORDER — FLUCONAZOLE 150 MG PO TABS: 150.0000 mg | ORAL_TABLET | Freq: Once | ORAL | Status: DC

## 2012-09-18 NOTE — Telephone Encounter (Signed)
Pt called requesting Rx for Diflucan to [facility] pharmacy on file.

## 2012-09-18 NOTE — Telephone Encounter (Signed)
Patient Information:  Caller Name: Khadejah  Phone: [phone]  Patient: Erica [name], Flow  Gender: Female  DOB: September 26, 1956  Age: 56 Years  PCP: Rene Paci (Adults only)   Symptoms  Reason For Call & Symptoms: "Yeast infection"; on antibiotics since 09/08/12 for sinus infection.  Relates needs Rx for same after antibiotics.  Reviewed Health History In EMR: Yes  Reviewed Medications In EMR: Yes  Reviewed Allergies In EMR: Yes  Reviewed Surgeries / Procedures: Yes  Date of Onset of Symptoms: 09/16/2012  Guideline(s) Used:  Vaginal Yeast Infection  Vaginal Discharge  Vulvar Symptoms  Disposition Per Guideline:   Home Care  Reason For Disposition Reached:   Symptoms of a vaginal yeast infection (i.e., white, thick, cottage-cheese-like, itchy, not bad smelling discharge)  Advice Given:  N/A  Office Follow Up:  Does the office need to follow up with this patient?: No  Instructions For The Office: N/A  RN Note:  Upon review of notes, found that this has already been handled.  Informed caller that Rx was sent to [facility] pharmacy.

## 2012-09-18 NOTE — Telephone Encounter (Signed)
Ok done

## 2012-09-18 NOTE — Telephone Encounter (Signed)
[name] hardship application was faxed on: 09/13/12. I will forward this message to Tara/Rose  to call and check on the status. Patient is aware.

## 2012-09-20 ENCOUNTER — Other Ambulatory Visit: Payer: Self-pay | Admitting: Internal Medicine

## 2012-09-21 ENCOUNTER — Telehealth: Payer: Self-pay | Admitting: *Deleted

## 2012-09-21 NOTE — Telephone Encounter (Signed)
Patient was enrolled for [employer] event monitor, financial hardship application was re-faxed. TK

## 2012-09-21 NOTE — Telephone Encounter (Signed)
Spoke with [employer], Pt. was enrolled 09/21/12 for event monitor, financial hardship application was re-faxed to [phone] Attn: Dorothy.

## 2012-09-28 ENCOUNTER — Encounter: Payer: Self-pay | Admitting: Cardiovascular Disease

## 2012-09-28 ENCOUNTER — Telehealth: Payer: Self-pay | Admitting: *Deleted

## 2012-09-28 NOTE — Telephone Encounter (Signed)
Pt requested callback regarding atrial fibrillation-Left message for pt to callback office.

## 2012-09-28 NOTE — Telephone Encounter (Signed)
Call patient to find out if she received the heart monitor from [employer]. Patient did receive monitor 09/26/18. Marland Kitchen

## 2012-10-03 NOTE — Telephone Encounter (Signed)
Closing phone note after several attempts to contact pt with no return phone call.

## 2012-10-05 ENCOUNTER — Telehealth: Payer: Self-pay | Admitting: *Deleted

## 2012-10-05 ENCOUNTER — Telehealth: Payer: Self-pay | Admitting: Internal Medicine

## 2012-10-05 DIAGNOSIS — R002 Palpitations: Secondary | ICD-10-CM

## 2012-10-05 DIAGNOSIS — I472 Ventricular tachycardia: Secondary | ICD-10-CM

## 2012-10-05 MED ORDER — ATENOLOL 50 MG PO TABS: 50.0000 mg | ORAL_TABLET | Freq: Every day | ORAL | Status: DC

## 2012-10-05 NOTE — Telephone Encounter (Signed)
[employer] called stating that pt has had episodes of wide complex tachycardia lasting up to 60 seconds and up to rate >200.  She is currently in sinus rhythm.  They have tried to contact her to see if she has sx, but they could not reach her.  Again, she is currently in sinus rhythm.  Strips were faxed over to Dr. Fabio Bering office.  Had them fax copy to [facility] - strips show WCT, some regular and some irregular - ddx includes SVT w abberancy or VT or ?artifact.  Pt has normal EF by recent echo.  Will forward information to Dr. Eden Emms for follow up.

## 2012-10-05 NOTE — Telephone Encounter (Signed)
Dr. Eden Emms reviewed [employer] monitor readings recommended pt increase Atenolol to 50mg  daily, schedule a Lexiscan myoview & EP consult. I spoke with the pt and she was symptomatic last night with the "palpitations". Instructed her about increasing her Atenolol from 25 mg to 50 mg daily,  About the Lexiscan, pt reminded [place] that she does not have any insurance at this time until later in 2014. States she has filled out assistance forms. Understands also needs appt with EP doctor here for [facility] shown on [employer] monitor.  Mylo Red RN

## 2012-10-05 NOTE — Telephone Encounter (Signed)
Increase atenolol to 50 mg  F/u lexiscan myovue  ? VT and abnormal ECG.  Then needs f/u with EPS after myovue and increasing atenolol

## 2012-10-06 NOTE — Telephone Encounter (Signed)
SEE OTHER MESSAGE  MONITOR TRACING HAS BEEN ALREADY  ADDRESSED .Erica [name]

## 2012-10-09 ENCOUNTER — Ambulatory Visit (HOSPITAL_COMMUNITY): Payer: Self-pay | Attending: Cardiology | Admitting: Radiology

## 2012-10-09 VITALS — BP 125/79 | Ht 67.0 in | Wt 249.0 lb

## 2012-10-09 DIAGNOSIS — R002 Palpitations: Secondary | ICD-10-CM | POA: Insufficient documentation

## 2012-10-09 DIAGNOSIS — R000 Tachycardia, unspecified: Secondary | ICD-10-CM | POA: Insufficient documentation

## 2012-10-09 DIAGNOSIS — I10 Essential (primary) hypertension: Secondary | ICD-10-CM | POA: Insufficient documentation

## 2012-10-09 DIAGNOSIS — R9431 Abnormal electrocardiogram [ECG] [EKG]: Secondary | ICD-10-CM

## 2012-10-09 DIAGNOSIS — R0602 Shortness of breath: Secondary | ICD-10-CM

## 2012-10-09 DIAGNOSIS — I4891 Unspecified atrial fibrillation: Secondary | ICD-10-CM

## 2012-10-09 DIAGNOSIS — I472 Ventricular tachycardia: Secondary | ICD-10-CM

## 2012-10-09 MED ORDER — TECHNETIUM TC 99M SESTAMIBI GENERIC - CARDIOLITE
33.0000 | Freq: Once | INTRAVENOUS | Status: AC | PRN
  Administered 2012-10-09: 33 via INTRAVENOUS

## 2012-10-09 MED ORDER — TECHNETIUM TC 99M SESTAMIBI GENERIC - CARDIOLITE
11.0000 | Freq: Once | INTRAVENOUS | Status: AC | PRN
  Administered 2012-10-09: 11 via INTRAVENOUS

## 2012-10-09 MED ORDER — REGADENOSON 0.4 MG/5ML IV SOLN
0.4000 mg | Freq: Once | INTRAVENOUS | Status: AC
  Administered 2012-10-09: 0.4 mg via INTRAVENOUS

## 2012-10-09 NOTE — Progress Notes (Signed)
[facility] SITE 3 NUCLEAR MED [address] [place], [place] [address] [phone]    Cardiology Nuclear Med Study  Erica [name] is a 56 y.o. female     MRN : [id number]     DOB: [birth year]  Procedure Date: 10/09/2012  Nuclear Med Background Indication for Stress Test:  Evaluation for Ischemia and Abnormal EKG with  ?VT captured on monitor History:  12/13 Echo-EF 55-60%; OSA and MPS and Cath at Dr. Elsie Lincoln 10-80yrs ago,normal per patient Cardiac Risk Factors: Hypertension and Lipids  Symptoms:  Fatigue, Palpitations and Rapid HR   Nuclear Pre-Procedure Caffeine/Decaff Intake:  None NPO After: 7:00pm   Lungs:  clear O2 Sat: 98% on room air. IV 0.9% NS with Angio Cath:  22g  IV Site: R Antecubital  IV Started by:  Doyne Keel, CNMT  Chest Size (in):  40 Cup Size: D  Height: 5\' 7"  (1.702 m)  Weight:  249 lb (112.946 kg)  BMI:  Body mass index is 39.00 kg/(m^2). Tech Comments:  Held all am meds, Atenolol taken at 2100    Nuclear Med Study 1 or 2 day study: 1 day  Stress Test Type:  Treadmill/Lexiscan  Reading MD: Olga Millers, MD  Order Authorizing Provider:  P. Eden Emms, MD  Resting Radionuclide: Technetium 81m Sestamibi  Resting Radionuclide Dose: 11.0 mCi   Stress Radionuclide:  Technetium 75m Sestamibi  Stress Radionuclide Dose: 33.0 mCi           Stress Protocol Rest HR: 54 Stress HR: 107  Rest BP: 125/79 Stress BP: 132/80  Exercise Time (min): 2:00 METS: 1.7   Predicted Max HR: 162 bpm % Max HR: 66.05 bpm Rate Pressure Product: 32440    Dose of Adenosine (mg):  n/a Dose of Lexiscan: 0.4 mg  Dose of Atropine (mg): n/a Dose of Dobutamine: n/a mcg/kg/min (at max HR)  Stress Test Technologist: Cathlyn Parsons, RN  Nuclear Technologist:  Domenic Polite, CNMT     Rest Procedure:  Myocardial perfusion imaging was performed at rest 45 minutes following the intravenous administration of Technetium 44m Sestamibi. Rest ECG: Sinus bradycardia, poor R  wave progression  Stress Procedure:  The patient received IV Lexiscan 0.4 mg over 15-seconds.  Technetium 39m Sestamibi injected at 30-seconds.  Quantitative spect images were obtained after a 45 minute delay. Stress ECG: No significant ST segment change suggestive of ischemia.  QPS Raw Data Images:  Acquisition technically good; normal left ventricular size. Stress Images:  There is decreased uptake in the distal anteroseptal wall. Rest Images:  There is decreased uptake in the distal anteroseptal wall, slightly less prominent compared to the stress images. Subtraction (SDS):  These findings are consistent with probable soft tissue attenuation; cannot exclude minimal ischemia. Transient Ischemic Dilatation (Normal <1.22):  0.92 Lung/Heart Ratio (Normal <0.45):  0.30  Quantitative Gated Spect Images QGS EDV:  83 ml QGS ESV:  26 ml  Impression Exercise Capacity:  Lexiscan with no exercise. BP Response:  Normal blood pressure response. Clinical Symptoms:  No chest pain or dyspnea. ECG Impression:  No significant ST segment change suggestive of ischemia. Comparison with Prior Nuclear Study: No images to compare  Overall Impression:  Low risk stress nuclear study with a small, mild, partially reversible distal anteroseptal defect consistent with probable soft tissue attenuation; cannot exclude minimal distal anterior ischemia.  LV Ejection Fraction: 69%.  LV Wall Motion:  NL LV Function; NL Wall Motion  Olga Millers

## 2012-10-10 ENCOUNTER — Encounter (HOSPITAL_COMMUNITY): Payer: Self-pay

## 2012-10-10 NOTE — Addendum Note (Signed)
Addended by: Milinda Antis on: 10/10/2012 10:15 AM   Modules accepted: Orders

## 2012-10-17 ENCOUNTER — Telehealth: Payer: Self-pay | Admitting: Internal Medicine

## 2012-10-17 NOTE — Telephone Encounter (Signed)
LMTCB ./CY 

## 2012-10-17 NOTE — Telephone Encounter (Signed)
Pt rtn call re stress test results °

## 2012-10-18 NOTE — Telephone Encounter (Signed)
F/u   Test results.   

## 2012-10-18 NOTE — Telephone Encounter (Signed)
Pt given results that myoview was normal per Dr Eden Emms.

## 2012-10-19 ENCOUNTER — Ambulatory Visit (INDEPENDENT_AMBULATORY_CARE_PROVIDER_SITE_OTHER): Payer: Self-pay | Admitting: Internal Medicine

## 2012-10-19 ENCOUNTER — Encounter: Payer: Self-pay | Admitting: Internal Medicine

## 2012-10-19 VITALS — BP 118/83 | HR 62 | Wt 249.0 lb

## 2012-10-19 DIAGNOSIS — I4891 Unspecified atrial fibrillation: Secondary | ICD-10-CM

## 2012-10-19 DIAGNOSIS — I10 Essential (primary) hypertension: Secondary | ICD-10-CM

## 2012-10-19 DIAGNOSIS — R002 Palpitations: Secondary | ICD-10-CM

## 2012-10-19 NOTE — Assessment & Plan Note (Signed)
I have reviewed the patient's electrocardiograms obtained at [facility]. I can find no episodes of atrial fibrillation.

## 2012-10-19 NOTE — Assessment & Plan Note (Signed)
The underlying mechanism of her palpitations is unclear though I suspect her palpitations are related to sinus tachycardia. Her cardiac monitor did not demonstrate anything but sinus tachycardia and sinus rhythm with artifact. I would recommend that she continue taking her beta blocker.

## 2012-10-19 NOTE — Progress Notes (Signed)
HPI   Erica [name] is referred today by Dr. Eden Emms for evaluation of palpitations and an abnormal cardiac monitor. She is a very pleasant 57 year old woman with a history of anxiety disorder. She has had recurrent episodes of tachycardia palpitations. She wore a cardiac monitor which demonstrated a wide QRS tachycardia at over 300 beats per minute. Careful evaluation of these rhythm strips demonstrates that her tachycardia was in fact do to artifact. Today I went back and reviewed all the electrocardiograms taken at [name] on this patient and they only demonstrate normal sinus rhythm. She has never had syncope. She notes that when she gets anxious or excited, she feels like her heart races. She does have documented sinus tachycardia. Allergies  Allergen Reactions  . Guaifenesin-Codeine     REACTION: feels spacey  . Bupropion Palpitations     Current Outpatient Prescriptions  Medication Sig Dispense Refill  . ALPRAZolam (XANAX) 1 MG tablet TAKE ONE TABLET BY MOUTH TWICE DAILY AS NEEDED FOR SLEEP OR ANXIETY  180 tablet  1  . atenolol (TENORMIN) 50 MG tablet Take 1 tablet (50 mg total) by mouth daily.  90 tablet  3  . cyclobenzaprine (FLEXERIL) 10 MG tablet Take 1 tablet (10 mg total) by mouth 3 (three) times daily as needed for muscle spasms. For pain.  90 tablet  3  . fluconazole (DIFLUCAN) 150 MG tablet Take 1 tablet (150 mg total) by mouth once. May repeat dose next day if needed  2 tablet  0  . fluticasone (FLONASE) 50 MCG/ACT nasal spray Place 1 spray into the nose 2 (two) times daily.  16 g  3  . furosemide (LASIX) 40 MG tablet Take 1 tablet (40 mg total) by mouth 2 (two) times daily.  180 tablet  1  . loratadine (CLARITIN) 10 MG tablet Take 10 mg by mouth as needed.      . meloxicam (MOBIC) 15 MG tablet Take 1 tablet (15 mg total) by mouth daily.  30 tablet  5  . oxycodone (OXY-IR) 5 MG capsule Take 5 mg by mouth every 4 (four) hours as needed. For pain.      . potassium chloride SA  (K-DUR,KLOR-CON) 20 MEQ tablet Take 1 tablet (20 mEq total) by mouth 2 (two) times daily.  60 tablet  5  . simvastatin (ZOCOR) 40 MG tablet Take 1 tablet (40 mg total) by mouth daily.  90 tablet  3     Past Medical History  Diagnosis Date  . Hypertension   . ALLERGIC RHINITIS   . Sleep apnea     CPAP hs  . PAF (paroxysmal atrial fibrillation)   . Dyslipidemia   . Panic attacks     Hx of depression  . Osteoarthritis   . Obesity     ROS:   All systems reviewed and negative except as noted in the HPI.   Past Surgical History  Procedure Date  . Right total knee surgery   . Abdominal hysterectomy   . Right oophorectomy     No cancer  . Child birth     x's 3     Family History  Problem Relation Age of Onset  . Heart disease Mother   . Dementia Father   . Allergies Sister   . Asthma Son   . Breast cancer Maternal Aunt   . Asthma Other   . Cancer Cousin     colon     History   Social History  . Marital Status: Married  Spouse Name: N/A    Number of Children: N/A  . Years of Education: N/A   Occupational History  . Not on file.   Social History Main Topics  . Smoking status: Never Smoker   . Smokeless tobacco: Never Used     Comment: Married with children. 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 Alcohol Use: No  . Drug Use: No  . Sexually Active: Not Currently   Other Topics Concern  . Not on file   Social History Narrative   Married with children. Pt is on disablity. Previous worked in the school system     BP 118/83  Pulse 62  Wt 249 lb (112.946 kg)  Physical Exam:  Well appearing obese, middle-age woman, NAD HEENT: Unremarkable Neck:  No JVD, no thyromegally Lungs:  Clear with no wheezes, rales, or rhonchi. HEART:  Distant, Regular rate rhythm, no murmurs, no rubs, no clicks Abd:  soft, obese, positive bowel sounds, no organomegally, no rebound, no guarding Ext:  2 plus pulses, no edema, no cyanosis, no  clubbing Skin:  No rashes no nodules Neuro:  CN II through XII intact, motor grossly intact  EKG - normal sinus rhythm  Assess/Plan:

## 2012-10-19 NOTE — Assessment & Plan Note (Signed)
The importance of maintaining a low sodium diet and continue her medications have been outlined. Also, I've asked the patient to lose weight.

## 2012-10-19 NOTE — Patient Instructions (Signed)
We will see you as needed. 

## 2012-10-24 ENCOUNTER — Ambulatory Visit: Payer: Self-pay | Admitting: Internal Medicine

## 2012-11-01 ENCOUNTER — Other Ambulatory Visit: Payer: Self-pay | Admitting: Internal Medicine

## 2012-11-01 DIAGNOSIS — Z1231 Encounter for screening mammogram for malignant neoplasm of breast: Secondary | ICD-10-CM

## 2012-11-30 ENCOUNTER — Telehealth (HOSPITAL_COMMUNITY): Payer: Self-pay | Admitting: *Deleted

## 2012-11-30 NOTE — Telephone Encounter (Signed)
Telephoned patient and left message to return call to [facility] 

## 2012-12-07 ENCOUNTER — Encounter (HOSPITAL_COMMUNITY): Payer: Self-pay | Admitting: *Deleted

## 2012-12-19 ENCOUNTER — Ambulatory Visit (HOSPITAL_COMMUNITY): Payer: Self-pay

## 2012-12-27 ENCOUNTER — Encounter (HOSPITAL_COMMUNITY): Payer: Self-pay | Admitting: *Deleted

## 2013-01-09 ENCOUNTER — Ambulatory Visit (HOSPITAL_COMMUNITY): Payer: Self-pay

## 2013-01-21 ENCOUNTER — Other Ambulatory Visit: Payer: Self-pay | Admitting: Internal Medicine

## 2013-03-12 ENCOUNTER — Other Ambulatory Visit: Payer: Self-pay | Admitting: *Deleted

## 2013-03-12 MED ORDER — ALPRAZOLAM 1 MG PO TABS: 0.5000 mg | ORAL_TABLET | Freq: Every evening | ORAL | Status: DC | PRN

## 2013-03-12 NOTE — Telephone Encounter (Signed)
Faxed script back to [facility].../lmb 

## 2013-03-13 ENCOUNTER — Encounter: Payer: Self-pay | Admitting: Internal Medicine

## 2013-03-13 ENCOUNTER — Other Ambulatory Visit (INDEPENDENT_AMBULATORY_CARE_PROVIDER_SITE_OTHER): Payer: Self-pay

## 2013-03-13 ENCOUNTER — Ambulatory Visit (INDEPENDENT_AMBULATORY_CARE_PROVIDER_SITE_OTHER): Payer: Self-pay | Admitting: Internal Medicine

## 2013-03-13 VITALS — BP 100/72 | HR 68 | Temp 98.4°F | Wt 247.1 lb

## 2013-03-13 DIAGNOSIS — T148XXA Other injury of unspecified body region, initial encounter: Secondary | ICD-10-CM

## 2013-03-13 DIAGNOSIS — J019 Acute sinusitis, unspecified: Secondary | ICD-10-CM

## 2013-03-13 LAB — CBC WITH DIFFERENTIAL/PLATELET
Basophils Absolute: 0 10*3/uL (ref 0.0–0.1)
Basophils Relative: 0.3 % (ref 0.0–3.0)
Eosinophils Absolute: 0.1 10*3/uL (ref 0.0–0.7)
Eosinophils Relative: 0.7 % (ref 0.0–5.0)
HCT: 39.4 % (ref 36.0–46.0)
Hemoglobin: 12.8 g/dL (ref 12.0–15.0)
Lymphocytes Relative: 27.5 % (ref 12.0–46.0)
Lymphs Abs: 1.9 10*3/uL (ref 0.7–4.0)
MCHC: 32.5 g/dL (ref 30.0–36.0)
MCV: 87.1 fl (ref 78.0–100.0)
Monocytes Absolute: 0.5 10*3/uL (ref 0.1–1.0)
Monocytes Relative: 7.7 % (ref 3.0–12.0)
Neutro Abs: 4.5 10*3/uL (ref 1.4–7.7)
Neutrophils Relative %: 63.8 % (ref 43.0–77.0)
Platelets: 251 10*3/uL (ref 150.0–400.0)
RBC: 4.52 Mil/uL (ref 3.87–5.11)
RDW: 14.4 % (ref 11.5–14.6)
WBC: 7 10*3/uL (ref 4.5–10.5)

## 2013-03-13 MED ORDER — AMOXICILLIN-POT CLAVULANATE 875-125 MG PO TABS: 1.0000 | ORAL_TABLET | Freq: Two times a day (BID) | ORAL | Status: AC

## 2013-03-13 NOTE — Patient Instructions (Signed)
It was good to see you today. augmentin antibiotics and prescription nose spray - Your prescription(s) have been submitted to your pharmacy. Please take as directed and contact our office if you believe you are having problem(s) with the medication(s). Alternate between ibuprofen and tylenol for aches, pain and fever symptoms as discussed Hydrate, rest and call if worse or unimproved Test(s) ordered today. Your results will be released to MyChart (or called to you) after review, usually within 72hours after test completion. If any changes need to be made, you will be notified at that same time. Please schedule followup in 6 months for blood pressure and cholesterol check, call sooner if problems.  Sinusitis Sinusitis is redness, soreness, and puffiness (inflammation) of the air pockets in the bones of your face (sinuses). The redness, soreness, and puffiness can cause air and mucus to get trapped in your sinuses. This can allow germs to grow and cause an infection.  HOME CARE   Drink enough fluids to keep your pee (urine) clear or pale yellow.  Use a humidifier in your home.  Run a hot shower to create steam in the bathroom. Sit in the bathroom with the door closed. Breathe in the steam 3 4 times a day.  Put a warm, moist washcloth on your face 3 4 times a day, or as told by your doctor.  Use salt water sprays (saline sprays) to wet the thick fluid in your nose. This can help the sinuses drain.  Only take medicine as told by your doctor. GET HELP RIGHT AWAY IF:   Your pain gets worse.  You have very bad headaches.  You are sick to your stomach (nauseous).  You throw up (vomit).  You are very sleepy (drowsy) all the time.  Your face is puffy (swollen).  Your vision changes.  You have a stiff neck.  You have trouble breathing. MAKE SURE YOU:   Understand these instructions.  Will watch your condition.  Will get help right away if you are not doing well or get  worse. Document Released: 03/15/2008 Document Revised: 06/21/2012 Document Reviewed: 05/02/2012 [facility] Patient Information 2014 [place], [place].

## 2013-03-13 NOTE — Progress Notes (Signed)
Subjective:    Patient ID: Erica [name], female    DOB: [birth year], 57 y.o.   MRN: [id number]  HPI   See chief complaint - complains of sinus issues and increased bruising symptoms ongoing x 2 weeks  Past Medical History  Diagnosis Date  . Hypertension   . ALLERGIC RHINITIS   . Sleep apnea     CPAP hs  . PAF (paroxysmal atrial fibrillation)   . Dyslipidemia   . Panic attacks     Hx of depression  . Osteoarthritis   . Obesity     Review of Systems  Constitutional: Positive for chills and fatigue. Negative for fever, diaphoresis and unexpected weight change.  HENT: Positive for congestion, postnasal drip and sinus pressure. Negative for hearing loss, ear pain, nosebleeds, sneezing, tinnitus and ear discharge.   Eyes: Positive for discharge. Negative for pain and visual disturbance.  Respiratory: Positive for cough. Negative for shortness of breath and wheezing.   Cardiovascular: Negative for chest pain and palpitations.  Skin: Negative for rash and wound.  Neurological: Negative for headaches.       Objective:   Physical Exam BP 100/72  Pulse 68  Temp(Src) 98.4 F (36.9 C) (Oral)  Wt 247 lb 1.9 oz (112.093 kg)  BMI 38.7 kg/m2  SpO2 98% Wt Readings from Last 3 Encounters:  03/13/13 247 lb 1.9 oz (112.093 kg)  10/19/12 249 lb (112.946 kg)  10/09/12 249 lb (112.946 kg)   Constitutional: She is overweight, but appears well-developed and well-nourished. No distress.  HENT: Head: Normocephalic and atraumatic. Mild sinus tenderness Ears: B TMs ok, no erythema or effusion; Nose: swollen tubinates, pale mucosa. Mouth/Throat: Oropharynx is mild red, but clear and moist. No oropharyngeal exudate.  Eyes: Conjunctivae and EOM are normal. Pupils are equal, round, and reactive to light. No scleral icterus.  Neck: Normal range of motion. Neck supple. No JVD present. No thyromegaly present.  Cardiovascular: Normal rate, regular rhythm and normal heart sounds.  No murmur heard.  No BLE edema. Pulmonary/Chest: Effort normal and breath sounds normal. No respiratory distress. She has no wheezes.  Neurological: She is alert and oriented to person, place, and time. No cranial nerve deficit. Coordination, balance, strength, speech and gait are normal.  Skin: few bruises on B forearms - Skin is warm and dry. No rash noted. No erythema.  Psychiatric: She has a normal mood and affect. Her behavior is normal. Judgment and thought content normal.   Lab Results  Component Value Date   WBC 7.8 07/13/2012   HGB 12.1 07/13/2012   HCT 37.5 07/13/2012   PLT 245.0 07/13/2012   GLUCOSE 114* 07/13/2012   CHOL 155 07/13/2012   TRIG 117.0 07/13/2012   HDL 40.10 07/13/2012   LDLCALC 92 07/13/2012   ALT 13 07/13/2012   AST 17 07/13/2012   NA 141 07/13/2012   K 3.5 07/13/2012   CL 106 07/13/2012   CREATININE 0.7 07/13/2012   BUN 16 07/13/2012   CO2 28 07/13/2012   TSH 3.27 07/13/2012   HGBA1C 6.2 01/23/2009        Assessment & Plan:    Viral URI > progression to acute sinusitis, present >10days Cough, postnasal drip related to above Bruising, mild - on extensor/contact -   check CBC Empiric antibiotics prescribed due to symptom duration greater than 7 days and progression despite OTC symptomatic care Nasal steroids- new prescriptions done Symptomatic care with Tylenol or Advil, decongestants, antihistamine, hydration and rest -  Saline irrigation and salt  gargle advised as needed

## 2013-03-20 ENCOUNTER — Encounter: Payer: Self-pay | Admitting: Internal Medicine

## 2013-03-20 ENCOUNTER — Other Ambulatory Visit (INDEPENDENT_AMBULATORY_CARE_PROVIDER_SITE_OTHER): Payer: Self-pay

## 2013-03-20 ENCOUNTER — Ambulatory Visit (INDEPENDENT_AMBULATORY_CARE_PROVIDER_SITE_OTHER): Payer: Self-pay | Admitting: Internal Medicine

## 2013-03-20 VITALS — BP 122/80 | HR 67 | Temp 98.1°F

## 2013-03-20 DIAGNOSIS — K644 Residual hemorrhoidal skin tags: Secondary | ICD-10-CM

## 2013-03-20 DIAGNOSIS — B379 Candidiasis, unspecified: Secondary | ICD-10-CM

## 2013-03-20 DIAGNOSIS — K625 Hemorrhage of anus and rectum: Secondary | ICD-10-CM

## 2013-03-20 LAB — BASIC METABOLIC PANEL
BUN: 11 mg/dL (ref 6–23)
CO2: 27 mEq/L (ref 19–32)
Calcium: 9.1 mg/dL (ref 8.4–10.5)
Chloride: 106 mEq/L (ref 96–112)
Creatinine, Ser: 0.7 mg/dL (ref 0.4–1.2)
GFR: 106.51 mL/min (ref >60.00)
Glucose, Bld: 103 mg/dL — ABNORMAL HIGH (ref 70–99)
Potassium: 3.5 mEq/L (ref 3.5–5.1)
Sodium: 141 mEq/L (ref 135–145)

## 2013-03-20 LAB — CBC
HCT: 37.9 % (ref 36.0–46.0)
Hemoglobin: 12.5 g/dL (ref 12.0–15.0)
MCHC: 33 g/dL (ref 30.0–36.0)
MCV: 86.9 fl (ref 78.0–100.0)
Platelets: 241 10*3/uL (ref 150.0–400.0)
RBC: 4.36 Mil/uL (ref 3.87–5.11)
RDW: 14.3 % (ref 11.5–14.6)
WBC: 6.2 10*3/uL (ref 4.5–10.5)

## 2013-03-20 MED ORDER — FLUCONAZOLE 150 MG PO TABS: 150.0000 mg | ORAL_TABLET | Freq: Once | ORAL | Status: DC

## 2013-03-20 MED ORDER — HYDROCORTISONE 2.5 % RE CREA: TOPICAL_CREAM | Freq: Two times a day (BID) | RECTAL | Status: DC

## 2013-03-20 NOTE — Progress Notes (Signed)
Subjective:    Patient ID: Erica [name], female    DOB: 10-06-1956, 57 y.o.   MRN: [id number]  HPI  Pt presents to the clinic today with c/o diarrhea with evidence of blood in her stool. She describes it as bright red with 1 small clot.This started 2 days after she started Augmentin for a sinus infection. She does have a history of external hemorrhoids. They are not currently bothering her and have never bled. She has had a normal colonoscopy in 2005. She is due to repeat it next year. She denies any abdominal pain or bleeding from the vagina Additionally, she c/o a yeast infection after taking the antibiotic. She normally gets diflucan for yeast infections put forgot to request this from Dr. Felicity Coyer.  Review of Systems      Past Medical History  Diagnosis Date  . Hypertension   . ALLERGIC RHINITIS   . Sleep apnea     CPAP hs  . PAF (paroxysmal atrial fibrillation)   . Dyslipidemia   . Panic attacks     Hx of depression  . Osteoarthritis   . Obesity     Current Outpatient Prescriptions  Medication Sig Dispense Refill  . ALPRAZolam (XANAX) 1 MG tablet Take 0.5-1 tablets (0.5-1 mg total) by mouth at bedtime as needed for sleep or anxiety.  180 tablet  1  . amoxicillin-clavulanate (AUGMENTIN) 875-125 MG per tablet Take 1 tablet by mouth 2 (two) times daily.  14 tablet  0  . atenolol (TENORMIN) 50 MG tablet Take 1 tablet (50 mg total) by mouth daily.  90 tablet  3  . cyclobenzaprine (FLEXERIL) 10 MG tablet Take 1 tablet (10 mg total) by mouth 3 (three) times daily as needed for muscle spasms. For pain.  90 tablet  3  . fluticasone (FLONASE) 50 MCG/ACT nasal spray Place 1 spray into the nose 2 (two) times daily.  16 g  3  . furosemide (LASIX) 40 MG tablet TAKE 1 TABLET (40 MG TOTAL) BY MOUTH 2 (TWO) TIMES DAILY.  180 tablet  1  . loratadine (CLARITIN) 10 MG tablet Take 10 mg by mouth as needed.      . meloxicam (MOBIC) 15 MG tablet Take 1 tablet (15 mg total) by mouth daily.  30  tablet  5  . oxycodone (OXY-IR) 5 MG capsule Take 5 mg by mouth every 4 (four) hours as needed. For pain.      Marland Kitchen PARoxetine (PAXIL) 10 MG tablet Take 10 mg by mouth every morning.      . potassium chloride SA (K-DUR,KLOR-CON) 20 MEQ tablet Take 1 tablet (20 mEq total) by mouth 2 (two) times daily.  60 tablet  5  . simvastatin (ZOCOR) 40 MG tablet Take 1 tablet (40 mg total) by mouth daily.  90 tablet  3  . fluconazole (DIFLUCAN) 150 MG tablet Take 1 tablet (150 mg total) by mouth once.  1 tablet  0  . hydrocortisone (ANUSOL-HC) 2.5 % rectal cream Place rectally 2 (two) times daily.  30 g  0   No current facility-administered medications for this visit.    Allergies  Allergen Reactions  . Guaifenesin-Codeine     REACTION: feels spacey  . Bupropion Palpitations    Family History  Problem Relation Age of Onset  . Heart disease Mother   . Dementia Father   . Allergies Sister   . Asthma Son   . Breast cancer Maternal Aunt   . Asthma Other   . Cancer  Cousin     colon    History   Social History  . Marital Status: Married    Spouse Name: N/A    Number of Children: N/A  . Years of Education: N/A   Occupational History  . Not on file.   Social History Main Topics  . Smoking status: Never Smoker   . Smokeless tobacco: Never Used     Comment: Married with children. 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 Alcohol Use: No  . Drug Use: No  . Sexually Active: Not Currently   Other Topics Concern  . Not on file   Social History Narrative   Married with children. Pt is on disablity. Previous worked in the school system     Constitutional: Pt reports fatigue. Denies fever, malaise, headache or abrupt weight changes.  Gastrointestinal: Pt reports diarrhea and blood in stool. Denies abdominal pain, bloating, constipation,.  GU: Denies urgency, frequency, pain with urination, burning sensation, blood in urine, odor or discharge. Neurological: Denies  dizziness, difficulty with memory, difficulty with speech or problems with balance and coordination.   No other specific complaints in a complete review of systems (except as listed in HPI above).  Objective:   Physical Exam   BP 122/80  Pulse 67  Temp(Src) 98.1 F (36.7 C) (Oral)  SpO2 96% Wt Readings from Last 3 Encounters:  03/13/13 247 lb 1.9 oz (112.093 kg)  10/19/12 249 lb (112.946 kg)  10/09/12 249 lb (112.946 kg)    General: Appears her stated age, well developed, well nourished in NAD.  Cardiovascular: Normal rate and rhythm. S1,S2 noted.  No murmur, rubs or gallops noted. No JVD or BLE edema. No carotid bruits noted. Pulmonary/Chest: Normal effort and positive vesicular breath sounds. No respiratory distress. No wheezes, rales or ronchi noted.  Abdomen: Soft and nontender. Normal bowel sounds, no bruits noted. No distention or masses noted. Liver, spleen and kidneys non palpable. DRE reveals multiple external hemorrhoids, non bleeding. Rectal exam shows normal tone, no evidence of fissure, polyp or internal hemorroids. BRB noted. Neurological: Alert and oriented. Cranial nerves II-XII intact. Coordination normal. +DTRs bilaterally.       Assessment & Plan:   Rectal bleeding secondary to diarrhea from antibiotic use:  Will check CBC, BMET today to evaluate blood loss Montior symptoms, should resolve now that you are no longer taking the antibiotic If not, we will go ahead and schedule your colonoscopy  External Hemorrhoids, chronic:  eRx for anusol cream  Antibiotic Induced yeast infection:  eRx for diflucan  RTC as needed or is symptoms persist or worsen

## 2013-03-20 NOTE — Patient Instructions (Signed)
Rectal Bleeding Rectal bleeding is when blood passes out of the anus. It is usually a sign that something is wrong. It may not be serious, but it should always be evaluated. Rectal bleeding may present as bright red blood or extremely dark stools. The color may range from dark red or maroon to black (like tar). It is important that the cause of rectal bleeding be identified so treatment can be started and the problem corrected. CAUSES   Hemorrhoids. These are enlarged (dilated) blood vessels or veins in the anal or rectal area.  Fistulas. Theseare abnormal, burrowing channels that usually run from inside the rectum to the skin around the anus. They can bleed.  Anal fissures. This is a tear in the tissue of the anus. Bleeding occurs with bowel movements.  Diverticulosis. This is a condition in which pockets or sacs project from the bowel wall. Occasionally, the sacs can bleed.  Diverticulitis. Thisis an infection involving diverticulosis of the colon.  Proctitis and colitis. These are conditions in which the rectum, colon, or both, can become inflamed and pitted (ulcerated).  Polyps and cancer. Polyps are non-cancerous (benign) growths in the colon that may bleed. Certain types of polyps turn into cancer.  Protrusion of the rectum. Part of the rectum can project from the anus and bleed.  Certain medicines.  Intestinal infections.  Blood vessel abnormalities. HOME CARE INSTRUCTIONS  Eat a high-fiber diet to keep your stool soft.  Limit activity.  Drink enough fluids to keep your urine clear or pale yellow.  Warm baths may be useful to soothe rectal pain.  Follow up with your caregiver as directed. SEEK IMMEDIATE MEDICAL CARE IF:  You develop increased bleeding.  You have black or dark red stools.  You vomit blood or material that looks like coffee grounds.  You have abdominal pain or tenderness.  You have a fever.  You feel weak, nauseous, or you faint.  You have  severe rectal pain or you are unable to have a bowel movement. MAKE SURE YOU:  Understand these instructions.  Will watch your condition.  Will get help right away if you are not doing well or get worse. Document Released: 03/19/2002 Document Revised: 12/20/2011 Document Reviewed: 03/14/2011 [employer] Patient Information 2014 [employer].  

## 2013-04-17 DIAGNOSIS — IMO0002 Reserved for concepts with insufficient information to code with codable children: Secondary | ICD-10-CM | POA: Diagnosis not present

## 2013-04-17 DIAGNOSIS — M5137 Other intervertebral disc degeneration, lumbosacral region: Secondary | ICD-10-CM | POA: Diagnosis not present

## 2013-04-24 DIAGNOSIS — IMO0002 Reserved for concepts with insufficient information to code with codable children: Secondary | ICD-10-CM | POA: Diagnosis not present

## 2013-05-01 DIAGNOSIS — M545 Low back pain, unspecified: Secondary | ICD-10-CM | POA: Diagnosis not present

## 2013-05-01 DIAGNOSIS — M5137 Other intervertebral disc degeneration, lumbosacral region: Secondary | ICD-10-CM | POA: Diagnosis not present

## 2013-05-02 ENCOUNTER — Other Ambulatory Visit: Payer: Self-pay

## 2013-05-02 DIAGNOSIS — Z1231 Encounter for screening mammogram for malignant neoplasm of breast: Secondary | ICD-10-CM

## 2013-05-15 DIAGNOSIS — Z1151 Encounter for screening for human papillomavirus (HPV): Secondary | ICD-10-CM | POA: Diagnosis not present

## 2013-05-15 DIAGNOSIS — Z01419 Encounter for gynecological examination (general) (routine) without abnormal findings: Secondary | ICD-10-CM | POA: Diagnosis not present

## 2013-05-15 DIAGNOSIS — Z113 Encounter for screening for infections with a predominantly sexual mode of transmission: Secondary | ICD-10-CM | POA: Diagnosis not present

## 2013-05-15 DIAGNOSIS — Z124 Encounter for screening for malignant neoplasm of cervix: Secondary | ICD-10-CM | POA: Diagnosis not present

## 2013-05-17 ENCOUNTER — Ambulatory Visit
Admission: RE | Admit: 2013-05-17 | Discharge: 2013-05-17 | Disposition: A | Discharge status: Home / Self Care | Payer: Medicare Other | Source: Ambulatory Visit

## 2013-05-17 DIAGNOSIS — Z1231 Encounter for screening mammogram for malignant neoplasm of breast: Secondary | ICD-10-CM

## 2013-05-20 ENCOUNTER — Other Ambulatory Visit: Payer: Self-pay | Admitting: Internal Medicine

## 2013-05-28 ENCOUNTER — Ambulatory Visit (INDEPENDENT_AMBULATORY_CARE_PROVIDER_SITE_OTHER): Payer: Medicare Other | Admitting: Internal Medicine

## 2013-05-28 ENCOUNTER — Encounter: Payer: Self-pay | Admitting: Internal Medicine

## 2013-05-28 VITALS — BP 112/80 | HR 67 | Temp 98.9°F | Wt 241.4 lb

## 2013-05-28 DIAGNOSIS — J309 Allergic rhinitis, unspecified: Secondary | ICD-10-CM

## 2013-05-28 DIAGNOSIS — K5909 Other constipation: Secondary | ICD-10-CM

## 2013-05-28 DIAGNOSIS — B370 Candidal stomatitis: Secondary | ICD-10-CM | POA: Diagnosis not present

## 2013-05-28 DIAGNOSIS — K219 Gastro-esophageal reflux disease without esophagitis: Secondary | ICD-10-CM

## 2013-05-28 MED ORDER — POTASSIUM CHLORIDE CRYS ER 20 MEQ PO TBCR: EXTENDED_RELEASE_TABLET | ORAL | Status: DC

## 2013-05-28 MED ORDER — HYDROCORTISONE 2.5 % RE CREA: TOPICAL_CREAM | Freq: Two times a day (BID) | RECTAL | Status: DC

## 2013-05-28 MED ORDER — NYSTATIN 100000 UNIT/ML MT SUSP: 500000.0000 [IU] | Freq: Four times a day (QID) | OROMUCOSAL | Status: DC

## 2013-05-28 MED ORDER — POLYETHYLENE GLYCOL 3350 17 GM/SCOOP PO POWD
17.0000 g | Freq: Every day | ORAL | Status: DC
Start: 2013-05-28 — End: 2014-05-08

## 2013-05-28 MED ORDER — OMEPRAZOLE 20 MG PO CPDR: 20.0000 mg | DELAYED_RELEASE_CAPSULE | Freq: Every day | ORAL | Status: DC

## 2013-05-28 MED ORDER — ATENOLOL 50 MG PO TABS: 50.0000 mg | ORAL_TABLET | Freq: Every day | ORAL | Status: DC

## 2013-05-28 MED ORDER — CYCLOBENZAPRINE HCL 10 MG PO TABS: 10.0000 mg | ORAL_TABLET | Freq: Three times a day (TID) | ORAL | Status: DC | PRN

## 2013-05-28 MED ORDER — PAROXETINE HCL 10 MG PO TABS: 10.0000 mg | ORAL_TABLET | ORAL | Status: DC

## 2013-05-28 MED ORDER — LORATADINE 10 MG PO TABS: 10.0000 mg | ORAL_TABLET | ORAL | Status: DC | PRN

## 2013-05-28 MED ORDER — FLUTICASONE PROPIONATE 50 MCG/ACT NA SUSP: 1.0000 | Freq: Two times a day (BID) | NASAL | Status: DC

## 2013-05-28 MED ORDER — SIMVASTATIN 40 MG PO TABS: ORAL_TABLET | ORAL | Status: DC

## 2013-05-28 MED ORDER — MELOXICAM 15 MG PO TABS: 15.0000 mg | ORAL_TABLET | Freq: Every day | ORAL | Status: DC

## 2013-05-28 NOTE — Patient Instructions (Signed)
It was good to see you today. We have reviewed your prior records including labs and tests today Medications reviewed and updated, no changes recommended at this time. Refill on medication(s) as discussed today. Use prescription mouthwash for mouth and throat pain to treat thrush - Your prescription(s) have been submitted to your pharmacy. Please take as directed and contact our office if you believe you are having problem(s) with the medication(s). Resume medications for allergies, reflux and constipation as reviewed

## 2013-05-28 NOTE — Assessment & Plan Note (Signed)
Resume Miralax and hemorrhoid cream as needed

## 2013-05-28 NOTE — Progress Notes (Signed)
Subjective:    Patient ID: Erica [name], female    DOB: [birth year], 57 y.o.   MRN: [id number]  Sore Throat  This is a recurrent problem. The current episode started in the past 7 days. The problem has been waxing and waning. Neither side of throat is experiencing more pain than the other. There has been no fever. The pain is mild. Associated symptoms include congestion and coughing. Pertinent negatives include no diarrhea, ear discharge, ear pain, headaches, hoarse voice, neck pain, shortness of breath, swollen glands, trouble swallowing or vomiting. She has had no exposure to strep or mono. She has tried cool liquids for the symptoms. The treatment provided mild relief.    Past Medical History  Diagnosis Date  . Hypertension   . ALLERGIC RHINITIS   . Sleep apnea     CPAP hs  . PAF (paroxysmal atrial fibrillation)   . Dyslipidemia   . Panic attacks     Hx of depression  . Osteoarthritis   . Obesity     Review of Systems  Constitutional: Positive for chills and fatigue. Negative for fever, diaphoresis and unexpected weight change.  HENT: Positive for congestion, postnasal drip and sinus pressure. Negative for hearing loss, ear pain, nosebleeds, hoarse voice, sneezing, trouble swallowing, neck pain, tinnitus and ear discharge.   Eyes: Positive for discharge. Negative for pain and visual disturbance.  Respiratory: Positive for cough. Negative for shortness of breath and wheezing.   Cardiovascular: Negative for chest pain and palpitations.  Gastrointestinal: Negative for vomiting and diarrhea.  Skin: Negative for rash and wound.  Neurological: Negative for headaches.       Objective:   Physical Exam  BP 112/80  Pulse 67  Temp(Src) 98.9 F (37.2 C) (Oral)  Wt 241 lb 6.4 oz (109.498 kg)  BMI 37.8 kg/m2  SpO2 98% Wt Readings from Last 3 Encounters:  05/28/13 241 lb 6.4 oz (109.498 kg)  03/13/13 247 lb 1.9 oz (112.093 kg)  10/19/12 249 lb (112.946 kg)   Constitutional:  She is overweight, but appears well-developed and well-nourished. No distress.  HENT: Head: Normocephalic and atraumatic. No sinus tenderness. Ears: B TMs ok, no erythema or effusion; Nose: swollen tubinates, pale mucosa. Mouth/Throat: Oropharynx is min red, but clear and moist. +oral thrush. No oropharyngeal exudate.  Eyes: Conjunctivae and EOM are normal. Pupils are equal, round, and reactive to light. No scleral icterus.  Neck: Normal range of motion. Neck supple. No JVD or LAD present. No thyromegaly present.  Cardiovascular: Normal rate, regular rhythm and normal heart sounds.  No murmur heard. No BLE edema. Pulmonary/Chest: Effort normal and breath sounds normal. No respiratory distress. She has no wheezes.  Psychiatric: She has a normal mood and affect. Her behavior is normal. Judgment and thought content normal.   Lab Results  Component Value Date   WBC 6.2 03/20/2013   HGB 12.5 03/20/2013   HCT 37.9 03/20/2013   PLT 241.0 03/20/2013   GLUCOSE 103* 03/20/2013   CHOL 155 07/13/2012   TRIG 117.0 07/13/2012   HDL 40.10 07/13/2012   LDLCALC 92 07/13/2012   ALT 13 07/13/2012   AST 17 07/13/2012   NA 141 03/20/2013   K 3.5 03/20/2013   CL 106 03/20/2013   CREATININE 0.7 03/20/2013   BUN 11 03/20/2013   CO2 27 03/20/2013   TSH 3.27 07/13/2012   HGBA1C 6.2 01/23/2009        Assessment & Plan:   Thrush - nystatin MM  GERD - resume PPI

## 2013-05-28 NOTE — Assessment & Plan Note (Signed)
Add flonase to ongoing oral antihistamine -

## 2013-05-29 ENCOUNTER — Other Ambulatory Visit: Payer: Self-pay | Admitting: *Deleted

## 2013-05-29 MED ORDER — ALPRAZOLAM 1 MG PO TABS: 0.5000 mg | ORAL_TABLET | Freq: Every evening | ORAL | Status: DC | PRN

## 2013-05-29 MED ORDER — FUROSEMIDE 40 MG PO TABS: ORAL_TABLET | ORAL | Status: DC

## 2013-05-29 NOTE — Telephone Encounter (Signed)
Ok to swallow mouthwash Ok to generate rx as requested

## 2013-05-29 NOTE — Telephone Encounter (Signed)
Printed rx's for md to sign. Pt states md was going to rx eye drop on yesterday. There is no information or rx for eye drops. Pt states would like rx to send to [place] because she can get for no cost...lmb

## 2013-05-29 NOTE — Telephone Encounter (Signed)
MD states she recommended over the counter eye drop " natural tears". Called pt back no answer LMOM md response...lmb

## 2013-05-29 NOTE — Telephone Encounter (Signed)
Left msg on vm stating md rx mouth wash wanting to know does she need to swallow. Also will need new scripts on her lasix, eye drops, and xanax 90 supply to send to [place]...lmb

## 2013-05-30 DIAGNOSIS — M47817 Spondylosis without myelopathy or radiculopathy, lumbosacral region: Secondary | ICD-10-CM | POA: Diagnosis not present

## 2013-06-14 DIAGNOSIS — M545 Low back pain, unspecified: Secondary | ICD-10-CM | POA: Diagnosis not present

## 2013-06-14 DIAGNOSIS — M5137 Other intervertebral disc degeneration, lumbosacral region: Secondary | ICD-10-CM | POA: Diagnosis not present

## 2013-06-14 DIAGNOSIS — IMO0002 Reserved for concepts with insufficient information to code with codable children: Secondary | ICD-10-CM | POA: Diagnosis not present

## 2013-06-19 ENCOUNTER — Other Ambulatory Visit: Payer: Self-pay | Admitting: *Deleted

## 2013-06-19 MED ORDER — FEXOFENADINE HCL 180 MG PO TABS: 180.0000 mg | ORAL_TABLET | Freq: Every day | ORAL | Status: DC

## 2013-06-19 MED ORDER — ALPRAZOLAM 1 MG PO TABS: 0.5000 mg | ORAL_TABLET | Freq: Every evening | ORAL | Status: DC | PRN

## 2013-06-19 MED ORDER — POTASSIUM CHLORIDE CRYS ER 20 MEQ PO TBCR: EXTENDED_RELEASE_TABLET | ORAL | Status: DC

## 2013-06-19 MED ORDER — MELOXICAM 15 MG PO TABS: 15.0000 mg | ORAL_TABLET | Freq: Every day | ORAL | Status: DC

## 2013-06-19 MED ORDER — PAROXETINE HCL 10 MG PO TABS: 10.0000 mg | ORAL_TABLET | ORAL | Status: DC

## 2013-06-19 MED ORDER — FUROSEMIDE 40 MG PO TABS: ORAL_TABLET | ORAL | Status: DC

## 2013-06-19 MED ORDER — ATENOLOL 50 MG PO TABS: 50.0000 mg | ORAL_TABLET | Freq: Every day | ORAL | Status: DC

## 2013-06-19 MED ORDER — OMEPRAZOLE 20 MG PO CPDR: 20.0000 mg | DELAYED_RELEASE_CAPSULE | Freq: Every day | ORAL | Status: DC

## 2013-06-19 MED ORDER — CYCLOBENZAPRINE HCL 10 MG PO TABS: 10.0000 mg | ORAL_TABLET | Freq: Three times a day (TID) | ORAL | Status: DC | PRN

## 2013-06-19 MED ORDER — SIMVASTATIN 40 MG PO TABS: ORAL_TABLET | ORAL | Status: DC

## 2013-06-19 NOTE — Telephone Encounter (Signed)
Faxed script to fax # given below. Pt has been notified...lmb

## 2013-06-19 NOTE — Telephone Encounter (Signed)
[facility]. They gave her a fax # were md can fax scripts to [phone]. Will need to put last 4 digits of SS #, also [facility] would like [facility] for allegra instead. Printed non controlled pls advise on alprazolam & allegra request..Marland KitchenMarland KitchenMarland Kitchenlmb

## 2013-06-19 NOTE — Addendum Note (Signed)
Addended by: Deatra James on: 06/19/2013 04:07 PM   Modules accepted: Orders

## 2013-06-21 ENCOUNTER — Ambulatory Visit (INDEPENDENT_AMBULATORY_CARE_PROVIDER_SITE_OTHER): Payer: Medicare Other | Admitting: Internal Medicine

## 2013-06-21 ENCOUNTER — Encounter: Payer: Self-pay | Admitting: Internal Medicine

## 2013-06-21 VITALS — BP 120/82 | HR 63 | Temp 99.0°F | Wt 241.4 lb

## 2013-06-21 DIAGNOSIS — K219 Gastro-esophageal reflux disease without esophagitis: Secondary | ICD-10-CM

## 2013-06-21 DIAGNOSIS — J329 Chronic sinusitis, unspecified: Secondary | ICD-10-CM

## 2013-06-21 DIAGNOSIS — B370 Candidal stomatitis: Secondary | ICD-10-CM | POA: Diagnosis not present

## 2013-06-21 DIAGNOSIS — J309 Allergic rhinitis, unspecified: Secondary | ICD-10-CM

## 2013-06-21 MED ORDER — FLUCONAZOLE 150 MG PO TABS: 150.0000 mg | ORAL_TABLET | Freq: Once | ORAL | Status: DC

## 2013-06-21 MED ORDER — OMEPRAZOLE 20 MG PO CPDR: 20.0000 mg | DELAYED_RELEASE_CAPSULE | Freq: Every day | ORAL | Status: DC

## 2013-06-21 MED ORDER — PROMETHAZINE-PHENYLEPHRINE 6.25-5 MG/5ML PO SYRP: 5.0000 mL | ORAL_SOLUTION | ORAL | Status: DC | PRN

## 2013-06-21 NOTE — Progress Notes (Signed)
Patient ID: Erica [name], female   DOB: 06/07/1956, 57 y.o.   MRN: [id number]   Subjective:   HPI  complains of head pressure, ?sinusitus Onset >1 month ago, initially improved then relapsing and worsening symptoms  Associated with rhinorrhea, sneezing, sore throat, mild headache, sinus pressure and mild-mod nasal congestion, yellow-green discharge Denies fever No relief with OTC and current rx meds Precipitated by sick contacts and weather change  Past Medical History  Diagnosis Date  . Hypertension   . ALLERGIC RHINITIS   . Sleep apnea     CPAP hs  . PAF (paroxysmal atrial fibrillation)   . Dyslipidemia   . Panic attacks     Hx of depression  . Osteoarthritis   . Obesity     Review of Systems Constitutional: No night sweats, no unexpected weight change Pulmonary: No pleurisy or hemoptysis Cardiovascular: No chest pain or palpitations     Objective:   Physical Exam BP 120/82  Pulse 63  Temp(Src) 99 F (37.2 C) (Oral)  Wt 241 lb 6.4 oz (109.498 kg)  BMI 37.8 kg/m2  SpO2 94% GEN: nontoxic appearing and audible head congestion HENT: NCAT, min sinus tenderness bilaterally, nares without discharge, mild turbinate swelling, oropharynx mild erythema and PND, no exudate Eyes: Vision grossly intact, no conjunctivitis Lungs: Clear to auscultation without rhonchi or wheeze, no increased work of breathing Cardiovascular: Regular rate and rhythm, no bilateral edema  Lab Results  Component Value Date   WBC 6.2 03/20/2013   HGB 12.5 03/20/2013   HCT 37.9 03/20/2013   PLT 241.0 03/20/2013   GLUCOSE 103* 03/20/2013   CHOL 155 07/13/2012   TRIG 117.0 07/13/2012   HDL 40.10 07/13/2012   LDLCALC 92 07/13/2012   ALT 13 07/13/2012   AST 17 07/13/2012   NA 141 03/20/2013   K 3.5 03/20/2013   CL 106 03/20/2013   CREATININE 0.7 03/20/2013   BUN 11 03/20/2013   CO2 27 03/20/2013   TSH 3.27 07/13/2012   HGBA1C 6.2 01/23/2009      Assessment & Plan:    sinusitis, chronic allergic  rhinitis GERD - noncompliant with PPI Cough, postnasal drip related to above   Encouraged trial of decongestant syrup with promethazine Continue oral antihistamine and nasal steroid Explain importance of daily PPI for symptom management of ENT concerns Refer to ENT for followup at patient request Reviewed lack of efficacy for antibiotics in non infectious disease Saline irrigation and salt gargle advised as needed

## 2013-06-21 NOTE — Patient Instructions (Signed)
It was good to see you today. We have reviewed your prior records including labs and tests today Medications reviewed and updated Prescription for acid reflux medication omeprazole [facility], also begin trial of antihistamine with decongestant syrup twice daily Use prescription Diflucan pill treat thrush - Your prescription(s) have been submitted to your pharmacy. Please take as directed and contact our office if you believe you are having problem(s) with the medication(s). Continue ongoing medications for allergies as reviewed we'll make referral to ENT specialist as requested. Our office will contact you regarding appointment(s) once made. Keep followup with sleep specialist as planned Followup here in 2-4 weeks if symptoms unimproved, sooner if worse

## 2013-06-22 DIAGNOSIS — J387 Other diseases of larynx: Secondary | ICD-10-CM | POA: Diagnosis not present

## 2013-06-22 DIAGNOSIS — J342 Deviated nasal septum: Secondary | ICD-10-CM | POA: Diagnosis not present

## 2013-06-22 DIAGNOSIS — J019 Acute sinusitis, unspecified: Secondary | ICD-10-CM | POA: Diagnosis not present

## 2013-06-22 DIAGNOSIS — M26609 Unspecified temporomandibular joint disorder, unspecified side: Secondary | ICD-10-CM | POA: Diagnosis not present

## 2013-07-06 DIAGNOSIS — J343 Hypertrophy of nasal turbinates: Secondary | ICD-10-CM | POA: Diagnosis not present

## 2013-07-06 DIAGNOSIS — J342 Deviated nasal septum: Secondary | ICD-10-CM | POA: Diagnosis not present

## 2013-07-06 DIAGNOSIS — J301 Allergic rhinitis due to pollen: Secondary | ICD-10-CM | POA: Diagnosis not present

## 2013-07-20 ENCOUNTER — Institutional Professional Consult (permissible substitution): Payer: Medicare Other | Admitting: Pulmonary Disease

## 2013-07-24 DIAGNOSIS — J309 Allergic rhinitis, unspecified: Secondary | ICD-10-CM | POA: Diagnosis not present

## 2013-07-25 ENCOUNTER — Ambulatory Visit (INDEPENDENT_AMBULATORY_CARE_PROVIDER_SITE_OTHER): Payer: Medicare Other | Admitting: Internal Medicine

## 2013-07-25 ENCOUNTER — Encounter: Payer: Self-pay | Admitting: Internal Medicine

## 2013-07-25 ENCOUNTER — Ambulatory Visit (INDEPENDENT_AMBULATORY_CARE_PROVIDER_SITE_OTHER)
Admission: RE | Admit: 2013-07-25 | Discharge: 2013-07-25 | Disposition: A | Discharge status: Home / Self Care | Payer: Medicare Other | Source: Ambulatory Visit | Attending: Internal Medicine | Admitting: Internal Medicine

## 2013-07-25 VITALS — BP 120/78 | HR 62 | Temp 98.5°F

## 2013-07-25 DIAGNOSIS — M25572 Pain in left ankle and joints of left foot: Secondary | ICD-10-CM

## 2013-07-25 DIAGNOSIS — S93409A Sprain of unspecified ligament of unspecified ankle, initial encounter: Secondary | ICD-10-CM

## 2013-07-25 DIAGNOSIS — Z23 Encounter for immunization: Secondary | ICD-10-CM

## 2013-07-25 DIAGNOSIS — M25579 Pain in unspecified ankle and joints of unspecified foot: Secondary | ICD-10-CM

## 2013-07-25 DIAGNOSIS — S93402A Sprain of unspecified ligament of left ankle, initial encounter: Secondary | ICD-10-CM

## 2013-07-25 DIAGNOSIS — S8990XA Unspecified injury of unspecified lower leg, initial encounter: Secondary | ICD-10-CM | POA: Diagnosis not present

## 2013-07-25 MED ORDER — FLUTICASONE PROPIONATE 50 MCG/ACT NA SUSP: 1.0000 | Freq: Two times a day (BID) | NASAL | Status: DC

## 2013-07-25 NOTE — Patient Instructions (Addendum)
It was good to see you today.  We have reviewed your prior records including labs and tests today  Test(s) ordered today. Your results will be released to MyChart (or called to you) after review, usually within 72hours after test completion. If any changes need to be made, you will be notified at that same time.  Use meloxicam every day as needed for pain instead of aspirin  Other medications reviewed and updated, refill provided as requested  Ankle Pain Ankle pain is a common symptom. The bones, cartilage, tendons, and muscles of the ankle joint perform a lot of work each day. The ankle joint holds your body weight and allows you to move around. Ankle pain can occur on either side or back of 1 or both ankles. Ankle pain may be sharp and burning or dull and aching. There may be tenderness, stiffness, redness, or warmth around the ankle. The pain occurs more often when a person walks or puts pressure on the ankle. CAUSES  There are many reasons ankle pain can develop. It is important to work with your caregiver to identify the cause since many conditions can impact the bones, cartilage, muscles, and tendons. Causes for ankle pain include:  Injury, including a break (fracture), sprain, or strain often due to a fall, sports, or a high-impact activity.  Swelling (inflammation) of a tendon (tendonitis).  Achilles tendon rupture.  Ankle instability after repeated sprains and strains.  Poor foot alignment.  Pressure on a nerve (tarsal tunnel syndrome).  Arthritis in the ankle or the lining of the ankle.  Crystal formation in the ankle (gout or pseudogout). DIAGNOSIS  A diagnosis is based on your medical history, your symptoms, results of your physical exam, and results of diagnostic tests. Diagnostic tests may include X-ray exams or a computerized magnetic scan (magnetic resonance imaging, MRI). TREATMENT  Treatment will depend on the cause of your ankle pain and may include:  Keeping  pressure off the ankle and limiting activities.  Using crutches or other walking support (a cane or brace).  Using rest, ice, compression, and elevation.  Participating in physical therapy or home exercises.  Wearing shoe inserts or special shoes.  Losing weight.  Taking medications to reduce pain or swelling or receiving an injection.  Undergoing surgery. HOME CARE INSTRUCTIONS   Only take over-the-counter or prescription medicines for pain, discomfort, or fever as directed by your caregiver.  Put ice on the injured area.  Put ice in a plastic bag.  Place a towel between your skin and the bag.  Leave the ice on for 15-20 minutes at a time, 3-4 times a day.  Keep your leg raised (elevated) when possible to lessen swelling.  Avoid activities that cause ankle pain.  Follow specific exercises as directed by your caregiver.  Record how often you have ankle pain, the location of the pain, and what it feels like. This information may be helpful to you and your caregiver.  Ask your caregiver about returning to work or sports and whether you should drive.  Follow up with your caregiver for further examination, therapy, or testing as directed. SEEK MEDICAL CARE IF:   Pain or swelling continues or worsens beyond 1 week.  You have an oral temperature above 102 F (38.9 C).  You are feeling unwell or have chills.  You are having an increasingly difficult time with walking.  You have loss of sensation or other new symptoms.  You have questions or concerns. MAKE SURE YOU:   Understand  these instructions.  Will watch your condition.  Will get help right away if you are not doing well or get worse. Document Released: 03/17/2010 Document Revised: 12/20/2011 Document Reviewed: 03/17/2010 [facility] Patient Information 2014 [place], [place].

## 2013-07-25 NOTE — Progress Notes (Signed)
Subjective:    Patient ID: Erica [name], female    DOB: 1955/12/11, 57 y.o.   MRN: [id number]  Fall The accident occurred 2 days ago. The fall occurred from a bed. She fell from a height of 1 to 2 ft. She landed on carpet. The point of impact was the left foot (left ankle). The pain is present in the left lower leg. The pain is mild. The symptoms are aggravated by ambulation and pressure on injury. Pertinent negatives include no abdominal pain, fever, headaches, nausea, numbness, tingling or vomiting. She has tried NSAID and rest for the symptoms. The treatment provided mild relief.    Past Medical History  Diagnosis Date  . Hypertension   . ALLERGIC RHINITIS   . Sleep apnea     CPAP hs  . PAF (paroxysmal atrial fibrillation)   . Dyslipidemia   . Panic attacks     Hx of depression  . Osteoarthritis   . Obesity    Family History  Problem Relation Age of Onset  . Heart disease Mother   . Dementia Father   . Allergies Sister   . Asthma Son   . Breast cancer Maternal Aunt   . Asthma Other   . Cancer Cousin     colon   History  Substance Use Topics  . Smoking status: Never Smoker   . Smokeless tobacco: Never Used     Comment: Married with children. 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 Alcohol Use: No   Review of Systems  Constitutional: Positive for fatigue. Negative for fever and unexpected weight change.  Cardiovascular: Negative for chest pain and leg swelling.  Gastrointestinal: Negative for nausea, vomiting and abdominal pain.  Neurological: Negative for tingling, numbness and headaches.       Objective:   Physical Exam BP 120/78  Pulse 62  Temp(Src) 98.5 F (36.9 C) (Oral)  SpO2 93% Wt Readings from Last 3 Encounters:  06/21/13 241 lb 6.4 oz (109.498 kg)  05/28/13 241 lb 6.4 oz (109.498 kg)  03/13/13 247 lb 1.9 oz (112.093 kg)   Constitutional: She is overweight, but appears well-developed and well-nourished. No distress.  Neck:  Normal range of motion. Neck supple. No JVD present. No thyromegaly present.  Cardiovascular: Normal rate, regular rhythm and normal heart sounds.  No murmur heard. No BLE edema. Pulmonary/Chest: Effort normal and breath sounds normal. No respiratory distress. She has no wheezes.  Musculoskeletal: L ankle without gross deformity. No swelling. Tender to palpation anterior laterally. Pain with passive external rotation>inversion. Ligamentous function stable and neurovascularly intact. Pain with WB effort. Skin: mild bruising of her left ankle but no soft tissue swelling -Skin is warm and dry. No rash noted. No erythema.  Psychiatric: She has a normal mood and affect. Her behavior is normal. Judgment and thought content normal.    Lab Results  Component Value Date   WBC 6.2 03/20/2013   HGB 12.5 03/20/2013   HCT 37.9 03/20/2013   PLT 241.0 03/20/2013   CHOL 155 07/13/2012   TRIG 117.0 07/13/2012   HDL 40.10 07/13/2012   ALT 13 07/13/2012   AST 17 07/13/2012   NA 141 03/20/2013   K 3.5 03/20/2013   CL 106 03/20/2013   CREATININE 0.7 03/20/2013   BUN 11 03/20/2013   CO2 27 03/20/2013   TSH 3.27 07/13/2012   HGBA1C 6.2 01/23/2009        Assessment & Plan:   Left ankle pain, lateral  Precipitated by accidental fall from bed  Plain film to rule out fibular fracture Suspect sprain if x-ray negative  Continue anti-inflammatory, recommends meloxicam over aspirin Okay for ice, RICE -ambulation as tolerated

## 2013-07-25 NOTE — Progress Notes (Signed)
Pre-visit discussion using our clinic review tool. No additional management support is needed unless otherwise documented below in the visit note.  

## 2013-08-02 DIAGNOSIS — Z96659 Presence of unspecified artificial knee joint: Secondary | ICD-10-CM | POA: Diagnosis not present

## 2013-08-02 DIAGNOSIS — S8000XA Contusion of unspecified knee, initial encounter: Secondary | ICD-10-CM | POA: Diagnosis not present

## 2013-08-21 DIAGNOSIS — J309 Allergic rhinitis, unspecified: Secondary | ICD-10-CM | POA: Diagnosis not present

## 2013-09-07 ENCOUNTER — Encounter: Payer: Self-pay | Admitting: Pulmonary Disease

## 2013-09-07 ENCOUNTER — Ambulatory Visit (INDEPENDENT_AMBULATORY_CARE_PROVIDER_SITE_OTHER): Payer: Medicare Other | Admitting: Pulmonary Disease

## 2013-09-07 VITALS — BP 126/78 | HR 68 | Temp 98.0°F | Ht 67.0 in | Wt 244.4 lb

## 2013-09-07 DIAGNOSIS — G4733 Obstructive sleep apnea (adult) (pediatric): Secondary | ICD-10-CM

## 2013-09-07 NOTE — Progress Notes (Signed)
Subjective:    Patient ID: Erica [name], female    DOB: Aug 09, 1956, 57 y.o.   MRN: [id number]  HPI 57 year old never smoker referred for management of obstructive sleep apnea. In 11/2009 She was found to have mild osa with AHI of 10/hr and desat as low as 83% - wt 240 lbs - BMI 39 She has been poorly compliant lately and states that CPAP causes her sore throat in the mornings. 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 Medication review shows Flexeril daily, Xanax twice daily and Vicodin every other day for pain. She is also maintained on atenolol due to palpitations although atrial fibrillation has not been documented Epworth sleepiness score is 11/24 and she report sleepiness while watching TV and as a passenger in a car. Bedtime is around 11 PM, sleep latency is about an hour, the TV stays on through the night, she sleeps on her side with 2 pillows, she is frequent nocturnal awakenings and is out of bed by 8 AM feeling tired with occasional headache. She's gained 20 pounds since her last study. There is no history suggestive of cataplexy, sleep paralysis or parasomnias   Past Medical History  Diagnosis Date  . Hypertension   . ALLERGIC RHINITIS   . Sleep apnea     CPAP hs  . PAF (paroxysmal atrial fibrillation)   . Dyslipidemia   . Panic attacks     Hx of depression  . Osteoarthritis   . Obesity     Past Surgical History  Procedure Laterality Date  . Right total knee surgery    . Abdominal hysterectomy    . Right oophorectomy      No cancer  . Child birth      x's 3    Allergies  Allergen Reactions  . Guaifenesin-Codeine     REACTION: feels spacey  . Bupropion Palpitations    History   Social History  . Marital Status: Married    Spouse Name: N/A    Number of Children: N/A  . Years of Education: N/A    Occupational History  . Not on file.   Social History Main Topics  . Smoking status: Never Smoker   . Smokeless tobacco: Never Used     Comment: Married with children. 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 Alcohol Use: No  . Drug Use: No  . Sexual Activity: Not Currently   Other Topics Concern  . Not on file   Social History Narrative   Married with children. Pt is on disablity. Previous worked in the school system    Family History  Problem Relation Age of Onset  . Heart disease Mother   . Dementia Father   . Allergies Sister   . Asthma Son   . Breast cancer Maternal Aunt   . Asthma Other   . Cancer Cousin     colon      Review of Systems  Constitutional: Negative for fever and unexpected weight change.  HENT: Positive for congestion, sore throat and trouble swallowing. Negative for dental problem, ear pain, nosebleeds, postnasal drip, rhinorrhea, sinus pressure and sneezing.   Eyes: Negative for redness and itching.  Respiratory: Negative for cough, chest tightness, shortness of breath and wheezing.   Cardiovascular: Negative for palpitations and leg swelling.  Gastrointestinal:  Negative for nausea and vomiting.  Genitourinary: Negative for dysuria.  Musculoskeletal: Negative for joint swelling.  Skin: Negative for rash.  Neurological: Negative for headaches.  Hematological: Does not bruise/bleed easily.  Psychiatric/Behavioral: Positive for dysphoric mood. The patient is nervous/anxious.        Objective:   Physical Exam  Gen. Pleasant, obese, in no distress, normal affect ENT - no lesions, no post nasal drip, class 2-3 airway Neck: No JVD, no thyromegaly, no carotid bruits Lungs: no use of accessory muscles, no dullness to percussion, decreased without rales or rhonchi  Cardiovascular: Rhythm regular, heart sounds  normal, no murmurs or gallops, no peripheral edema Abdomen: soft and non-tender, no hepatosplenomegaly, BS  normal. Musculoskeletal: No deformities, no cyanosis or clubbing Neuro:  alert, non focal, no tremors        Assessment & Plan:

## 2013-09-07 NOTE — Patient Instructions (Signed)
Sleep study We will check download on your CPAP machine Try to use CPAP with humidifier as much as you can

## 2013-09-07 NOTE — Assessment & Plan Note (Addendum)
Sleep study Given her mild weight gain. We will check download on your CPAP machine Meantime, Try to use CPAP with humidifier as much as you can Placed on polysomnogram we will reassess need for CPAP. Alternatively her tiredness and fatigue may be related to medications  Weight loss encouraged, compliance with goal of at least 4-6 hrs every night is the expectation. Advised against medications with sedative side effects Cautioned against driving when sleepy - understanding that sleepiness will vary on a day to day basis

## 2013-09-19 ENCOUNTER — Telehealth: Payer: Self-pay

## 2013-09-19 NOTE — Telephone Encounter (Signed)
Patient called lmov[facility] stating  flu x last Tuesday and now c/o head spinning when sitting up. Message routed to scheduler to schedule appt.

## 2013-09-20 ENCOUNTER — Ambulatory Visit (INDEPENDENT_AMBULATORY_CARE_PROVIDER_SITE_OTHER): Payer: Medicare Other | Admitting: Internal Medicine

## 2013-09-20 ENCOUNTER — Encounter: Payer: Self-pay | Admitting: Internal Medicine

## 2013-09-20 VITALS — BP 100/80 | HR 62 | Temp 98.1°F | Ht 67.0 in | Wt 231.0 lb

## 2013-09-20 DIAGNOSIS — I951 Orthostatic hypotension: Secondary | ICD-10-CM | POA: Insufficient documentation

## 2013-09-20 DIAGNOSIS — I10 Essential (primary) hypertension: Secondary | ICD-10-CM

## 2013-09-20 DIAGNOSIS — R112 Nausea with vomiting, unspecified: Secondary | ICD-10-CM | POA: Diagnosis not present

## 2013-09-20 MED ORDER — PROMETHAZINE HCL 25 MG/ML IJ SOLN: 25.0000 mg | Freq: Once | INTRAMUSCULAR | Status: DC

## 2013-09-20 MED ORDER — PROMETHAZINE HCL 25 MG RE SUPP: 25.0000 mg | Freq: Four times a day (QID) | RECTAL | Status: DC | PRN

## 2013-09-20 MED ORDER — PROMETHAZINE HCL 25 MG/ML IJ SOLN
25.0000 mg | Freq: Once | INTRAMUSCULAR | Status: AC
  Administered 2013-09-20: 25 mg via INTRAMUSCULAR

## 2013-09-20 MED ORDER — PROMETHAZINE HCL 25 MG PO TABS: 25.0000 mg | ORAL_TABLET | Freq: Four times a day (QID) | ORAL | Status: DC | PRN

## 2013-09-20 NOTE — Assessment & Plan Note (Signed)
Likely due to volume depletion related to decreased po intake, some loose stools, and lasix use; most likley viral gastroenteritis, possibly improving symptomatically except for the orthostasis; pt declines referral to ER where IVF and labs can be done;  Today for phenergan 25 mg IM, and phenergan po or pr prn, push fluids over the next few days, and hold lasix for at least 5 days or until LE may recur.  To ER for any worsening such as fever, cough, sob, CP, abd pain, n/v, diarrhea, blood, weakness or falls.

## 2013-09-20 NOTE — Progress Notes (Signed)
Subjective:    Patient ID: Erica [name], female    DOB: 1956-03-06, 57 y.o.   MRN: [id number]  HPI  Here with 6 days onset feverish, n/v, crampy abd pains, initial loose stools now resolved, no blood, but with general weakness and malaise, dizziness with standing, low appetite all in the setting of cont'd lasix use.  Pt denies chest pain, increased sob or doe, wheezing, orthopnea, PND, increased LE swelling, palpitations, dizziness or syncope.   Pt denies polydipsia, polyuria, Has been exposed to several family ill as well recently visiting a sick hospd person.  Did seem to start soon after flu shot Past Medical History  Diagnosis Date  . Hypertension   . ALLERGIC RHINITIS   . Sleep apnea     CPAP hs  . PAF (paroxysmal atrial fibrillation)   . Dyslipidemia   . Panic attacks     Hx of depression  . Osteoarthritis   . Obesity    Past Surgical History  Procedure Laterality Date  . Right total knee surgery    . Abdominal hysterectomy    . Right oophorectomy      No cancer  . Child birth      x's 3    reports that she has never smoked. She has never used smokeless tobacco. She reports that she does not drink alcohol or use illicit drugs. family history includes Allergies in her sister; Asthma in her other and son; Breast cancer in her maternal aunt; Cancer in her cousin; Dementia in her father; Heart disease in her mother. Allergies  Allergen Reactions  . Guaifenesin-Codeine     REACTION: feels spacey  . Bupropion Palpitations   Current Outpatient Prescriptions on File Prior to Visit  Medication Sig Dispense Refill  . ALPRAZolam (XANAX) 1 MG tablet Take 0.5-1 tablets (0.5-1 mg total) by mouth at bedtime as needed for sleep or anxiety.  180 tablet  0  . atenolol (TENORMIN) 50 MG tablet Take 1 tablet (50 mg total) by mouth daily.  90 tablet  3  . cyclobenzaprine (FLEXERIL) 10 MG tablet Take 1 tablet (10 mg total) by mouth 3 (three) times daily as needed for muscle spasms. For  pain.  90 tablet  0  . fluticasone (FLONASE) 50 MCG/ACT nasal spray Place 1 spray into the nose 2 (two) times daily.  48 g  0  . furosemide (LASIX) 40 MG tablet TAKE 1 TABLET (40 MG TOTAL) BY MOUTH 2 (TWO) TIMES DAILY.  180 tablet  3  . hydrocortisone (ANUSOL-HC) 2.5 % rectal cream Place rectally as needed.      . meloxicam (MOBIC) 15 MG tablet Take 1 tablet (15 mg total) by mouth daily.  90 tablet  3  . montelukast (SINGULAIR) 10 MG tablet Take 10 mg by mouth at bedtime.      Marland Kitchen omeprazole (PRILOSEC) 20 MG capsule Take 1 capsule (20 mg total) by mouth daily.  30 capsule  0  . oxycodone (OXY-IR) 5 MG capsule Take 5 mg by mouth every 4 (four) hours as needed. For pain.      Marland Kitchen PARoxetine (PAXIL) 10 MG tablet Take 1 tablet (10 mg total) by mouth every morning.  90 tablet  3  . polyethylene glycol powder (GLYCOLAX/MIRALAX) powder Take 17 g by mouth daily.  3350 g  1  . potassium chloride SA (K-DUR,KLOR-CON) 20 MEQ tablet TAKE 1 TABLET BY MOUTH TWICE DAILY  180 tablet  3  . simvastatin (ZOCOR) 40 MG tablet TAKE ONE  TABLET BY MOUTH DAILY  90 tablet  3   No current facility-administered medications on file prior to visit.   Review of Systems  Constitutional: Negative for unexpected weight change, or unusual diaphoresis  HENT: Negative for tinnitus.   Eyes: Negative for photophobia and visual disturbance.  Respiratory: Negative for choking and stridor.   Gastrointestinal: Negative for vomiting and blood in stool.  Genitourinary: Negative for hematuria and decreased urine volume.  Musculoskeletal: Negative for acute joint swelling Skin: Negative for color change and wound.  Neurological: Negative for tremors and numbness other than noted  Psychiatric/Behavioral: Negative for decreased concentration or  hyperactivity.       Objective:   Physical Exam BP 100/80  Pulse 62  Temp(Src) 98.1 F (36.7 C) (Oral)  Ht 5\' 7"  (1.702 m)  Wt 231 lb (104.781 kg)  BMI 36.17 kg/m2  SpO2 97% VS noted, mild  ill, + orthostatic as documented Constitutional: Pt appears well-developed and well-nourished.  HENT: Head: NCAT.  Right Ear: External ear normal.  Left Ear: External ear normal.  Eyes: Conjunctivae and EOM are normal. Pupils are equal, round, and reactive to light.  Neck: Normal range of motion. Neck supple.  Cardiovascular: Normal rate and regular rhythm.   Pulmonary/Chest: Effort normal and breath sounds normal.  Abd:  Soft, NT, non-distended, + BS Neurological: Pt is alert. Not confused  Skin: Skin is warm. No erythema. No rash Psychiatric: Pt behavior is normal. Thought content normal.     Assessment & Plan:

## 2013-09-20 NOTE — Progress Notes (Signed)
Pre-visit discussion using our clinic review tool. No additional management support is needed unless otherwise documented below in the visit note.  

## 2013-09-20 NOTE — Assessment & Plan Note (Signed)
stable overall by history and exam, recent data reviewed with pt, and pt to continue medical treatment as before,  to f/u any worsening symptoms or concerns BP Readings from Last 3 Encounters:  09/20/13 100/80  09/07/13 126/78  07/25/13 120/78

## 2013-09-20 NOTE — Patient Instructions (Signed)
You had the nausea shot today (this can make you some sleepy) Please take all new medication as prescribed  - the pills or the suppos nausea medications as needed Please continue all other medications as before, except you should HOLD on taking the lasix for at least 4-5 days, or until you see swelling starting to come back to the legs and feet  Please go to ER if any worse such as n/v that is not getting better, worsening dizziness or falls, or fever or pain

## 2013-09-20 NOTE — Assessment & Plan Note (Signed)
Doubt obstruction, for phenergan prn, consider film if worsens or persists

## 2013-09-25 ENCOUNTER — Telehealth: Payer: Self-pay | Admitting: Pulmonary Disease

## 2013-09-25 DIAGNOSIS — J309 Allergic rhinitis, unspecified: Secondary | ICD-10-CM | POA: Diagnosis not present

## 2013-09-25 NOTE — Telephone Encounter (Signed)
Download 07/26/13 - on auto 5-20 cm avg pr 12 cm No residuals, poor usage CPAP is very effective when used - change to 12 cm She has to use this every night

## 2013-09-27 NOTE — Telephone Encounter (Signed)
lmtcb

## 2013-09-28 NOTE — Telephone Encounter (Signed)
lmomtcb x 2  

## 2013-10-01 ENCOUNTER — Encounter: Payer: Self-pay | Admitting: *Deleted

## 2013-10-01 NOTE — Telephone Encounter (Signed)
lmomtcb x3--will send letter to pt

## 2013-10-03 ENCOUNTER — Other Ambulatory Visit: Payer: Self-pay | Admitting: Pulmonary Disease

## 2013-10-03 DIAGNOSIS — G4733 Obstructive sleep apnea (adult) (pediatric): Secondary | ICD-10-CM

## 2013-10-07 ENCOUNTER — Ambulatory Visit (HOSPITAL_BASED_OUTPATIENT_CLINIC_OR_DEPARTMENT_OTHER): Payer: Medicare Other | Attending: Pulmonary Disease

## 2013-10-07 VITALS — Ht 67.0 in | Wt 244.0 lb

## 2013-10-07 DIAGNOSIS — G4733 Obstructive sleep apnea (adult) (pediatric): Secondary | ICD-10-CM | POA: Diagnosis not present

## 2013-10-10 ENCOUNTER — Telehealth: Payer: Self-pay | Admitting: Pulmonary Disease

## 2013-10-10 DIAGNOSIS — G4733 Obstructive sleep apnea (adult) (pediatric): Secondary | ICD-10-CM | POA: Diagnosis not present

## 2013-10-10 NOTE — Sleep Study (Signed)
[facility]  NAME: BITA CARTWRIGHT  DATE OF BIRTH: 08-06-1956  MEDICAL RECORD NUMBER [id number]  LOCATION: [facility]  PHYSICIAN: Carole Doner V.  DATE OF STUDY:10/10/2013   SLEEP STUDY TYPE: Nocturnal Polysomnogram               REFERRING PHYSICIAN: Oretha Milch, MD  INDICATION FOR STUDY: 57 year old never smoker who  was found to have mild osa with AHI of 10/hr and desat as low as 83% In 11/2009 - wt 240 lbs - BMI 39  At the time of this study ,they weighed 244 pounds with a height of  5 ft 8 inches and the BMI of 38, neck size of 14 inches. Epworth sleepiness score was 11   This nocturnal polysomnogram was performed with a sleep technologist in attendance. EEG, EOG,EMG and respiratory parameters recorded. Sleep stages, arousals, limb movements and respiratory data was scored according to criteria laid out by the American Academy of sleep medicine.  SLEEP ARCHITECTURE: Lights out was at 23-09 PM and lights on was at 5-14 AM. Total sleep time was 149 minutes with a sleep period time of 150 minutes and a sleep efficiency of 41 %. Sleep latency was 215 minutes with latency to REM sleep of 111 minutes and wake after sleep onset of 1 minutes. . Sleep stages as a percentage of total sleep time was N1 -1%,N2- 80% and REM sleep 19% ( 28 minutes) . The longest period of REM sleep was around 5 AM.   AROUSAL DATA : There were 1 arousals with an arousal index of 0.4 events per hour.   RESPIRATORY DATA: There were 18 obstructive apneas, 2 central apneas, 0 mixed apneas and 12 hypopneas with apnea -hypopnea index of 13 events per hour. There were 0 RERAs with an RDI of 13 events per hour. There was no relation to sleep stage or body position.  MOVEMENT/PARASOMNIA: There were 0 PLMS with a PLM index of 0 events per hour. The PLM arousal index was 0 events per hour.  OXYGEN DATA: The lowest desaturation was 87% during non-REM sleep and the desaturation  index was 13 per hour. The saturations stayed below 88% for 0.6 minutes.  CARDIAC DATA: The low heart rate was 49 beats per minute. The high heart rate recorded was an artifact. No arrhythmias were noted   IMPRESSION :  1. Nild obstructive sleep apnea with hypopneas causing sleep fragmentation and mild oxygen desaturation. 2. No evidence of cardiac arrhythmias,  periodic limb movements or behavioral disturbance during sleep.   RECOMMENDATION:    1. The treatment options for this degree of sleep disordered breathing includes weight loss and/or CPAP therapy or oral appliance. 2. Patient should be cautioned against driving when sleepy 3. They should be asked to avoid medications with sedative side effects  Demetrick Eichenberger V. Diplomate, Biomedical engineer of Sleep Medicine  ELECTRONICALLY SIGNED ON:  09/11/2013, 1:24 PM [facility] PH: [phone]   FX: [phone] ACCREDITED BY THE AMERICAN ACADEMY OF SLEEP MEDICINE

## 2013-10-10 NOTE — Telephone Encounter (Signed)
PSG again showd mild OSA- AHI 13/h Await CPAP download - has she been able to use better?

## 2013-10-18 NOTE — Telephone Encounter (Signed)
Pt aware of results. She reports she is able to use it better now with current pressure.

## 2013-10-31 ENCOUNTER — Ambulatory Visit: Payer: Medicare Other | Admitting: Pulmonary Disease

## 2013-11-05 ENCOUNTER — Telehealth: Payer: Self-pay | Admitting: Internal Medicine

## 2013-11-05 MED ORDER — ALPRAZOLAM 1 MG PO TABS: 0.5000 mg | ORAL_TABLET | Freq: Every evening | ORAL | Status: DC | PRN

## 2013-11-05 NOTE — Telephone Encounter (Signed)
ok 

## 2013-11-05 NOTE — Telephone Encounter (Signed)
Called pt no answer LMOM rx ready for pick-up.../lmb 

## 2013-11-05 NOTE — Telephone Encounter (Signed)
Requesting refill on xanax.  Please call when ready.

## 2013-11-21 ENCOUNTER — Encounter: Payer: Self-pay | Admitting: Internal Medicine

## 2013-11-21 DIAGNOSIS — N898 Other specified noninflammatory disorders of vagina: Secondary | ICD-10-CM | POA: Diagnosis not present

## 2013-11-21 DIAGNOSIS — R350 Frequency of micturition: Secondary | ICD-10-CM | POA: Diagnosis not present

## 2013-11-21 DIAGNOSIS — N909 Noninflammatory disorder of vulva and perineum, unspecified: Secondary | ICD-10-CM | POA: Diagnosis not present

## 2013-11-21 DIAGNOSIS — Z113 Encounter for screening for infections with a predominantly sexual mode of transmission: Secondary | ICD-10-CM | POA: Diagnosis not present

## 2013-11-21 DIAGNOSIS — N949 Unspecified condition associated with female genital organs and menstrual cycle: Secondary | ICD-10-CM | POA: Diagnosis not present

## 2013-11-21 DIAGNOSIS — B3731 Acute candidiasis of vulva and vagina: Secondary | ICD-10-CM | POA: Diagnosis not present

## 2013-11-21 DIAGNOSIS — B373 Candidiasis of vulva and vagina: Secondary | ICD-10-CM | POA: Diagnosis not present

## 2013-11-22 ENCOUNTER — Encounter: Payer: Self-pay | Admitting: *Deleted

## 2013-11-28 ENCOUNTER — Ambulatory Visit (INDEPENDENT_AMBULATORY_CARE_PROVIDER_SITE_OTHER): Payer: Medicare Other | Admitting: Pulmonary Disease

## 2013-11-28 ENCOUNTER — Encounter: Payer: Self-pay | Admitting: Pulmonary Disease

## 2013-11-28 VITALS — BP 144/62 | HR 79 | Temp 98.2°F | Wt 245.4 lb

## 2013-11-28 DIAGNOSIS — G4733 Obstructive sleep apnea (adult) (pediatric): Secondary | ICD-10-CM | POA: Diagnosis not present

## 2013-11-28 NOTE — Patient Instructions (Signed)
Rx for new cpap will be sent Lower pressure Weight loss encouraged, compliance with goal of at least 4-6 hrs every night is the expectation. Cautioned against driving when sleepy - understanding that sleepiness will vary on a day to day basis

## 2013-11-28 NOTE — Assessment & Plan Note (Signed)
Mild OSA  Rx for new cpap will be sent - clearly cpap does control events & improve somnolence,  Lower pressure Weight loss encouraged, compliance with goal of at least 4-6 hrs every night is the expectation. Cautioned against driving when sleepy - understanding that sleepiness will vary on a day to day basis

## 2013-11-28 NOTE — Progress Notes (Signed)
   Subjective:    Patient ID: Erica [name], female    DOB: 07/12/1956, 58 y.o.   MRN: [id number]  HPI  58 year old never smoker for FU of obstructive sleep apnea.  In 11/2009 She was found to have mild osa with AHI of 10/hr and desat as low as 83% - wt 240 lbs - BMI 39  She has been poorly compliant lately and states that CPAP causes her sore throat in the mornings. 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  Medication review shows Flexeril daily, Xanax twice daily and Vicodin every other day for pain. She is also maintained on atenolol due to palpitations although atrial fibrillation has not been documented  Epworth sleepiness score is 11/24 and she report sleepiness while watching TV and as a passenger in a car.  Bedtime is around 11 PM, sleep latency is about an hour, the TV stays on through the night, she sleeps on her side with 2 pillows, she is frequent nocturnal awakenings and is out of bed by 8 AM feeling tired with occasional headache. She's gained 20 pounds since her last study.   Download 07/26/13 - on auto 5-20 cm , avg pr 12 cm  No residuals, poor usage  CPAP is very effective when used - change to 12 cm PSG again showed mild OSA- AHI 13/h  Chief Complaint  Patient presents with  . Follow-up    Pt reports sh eis having ahard time with machine. It makes her head hurt and she has a sore throat.    Per DME, CPAP unit is noisy, needs replacement  Review of Systems neg for any significant sore throat, dysphagia, itching, sneezing, nasal congestion or excess/ purulent secretions, fever, chills, sweats, unintended wt loss, pleuritic or exertional cp, hempoptysis, orthopnea pnd or change in chronic leg swelling. Also denies presyncope, palpitations, heartburn, abdominal pain, nausea, vomiting, diarrhea or change in bowel or urinary  habits, dysuria,hematuria, rash, arthralgias, visual complaints, headache, numbness weakness or ataxia.     Objective:   Physical Exam  Gen. Pleasant, obese, in no distress ENT - no lesions, no post nasal drip Neck: No JVD, no thyromegaly, no carotid bruits Lungs: no use of accessory muscles, no dullness to percussion, decreased without rales or rhonchi  Cardiovascular: Rhythm regular, heart sounds  normal, no murmurs or gallops, no peripheral edema Musculoskeletal: No deformities, no cyanosis or clubbing , no tremors       Assessment & Plan:

## 2013-11-29 DIAGNOSIS — R1084 Generalized abdominal pain: Secondary | ICD-10-CM | POA: Diagnosis not present

## 2013-12-03 DIAGNOSIS — F411 Generalized anxiety disorder: Secondary | ICD-10-CM | POA: Diagnosis not present

## 2013-12-03 DIAGNOSIS — F331 Major depressive disorder, recurrent, moderate: Secondary | ICD-10-CM | POA: Diagnosis not present

## 2013-12-03 DIAGNOSIS — F410 Panic disorder [episodic paroxysmal anxiety] without agoraphobia: Secondary | ICD-10-CM | POA: Diagnosis not present

## 2013-12-25 DIAGNOSIS — F331 Major depressive disorder, recurrent, moderate: Secondary | ICD-10-CM | POA: Diagnosis not present

## 2013-12-25 DIAGNOSIS — F411 Generalized anxiety disorder: Secondary | ICD-10-CM | POA: Diagnosis not present

## 2013-12-25 DIAGNOSIS — F410 Panic disorder [episodic paroxysmal anxiety] without agoraphobia: Secondary | ICD-10-CM | POA: Diagnosis not present

## 2013-12-31 ENCOUNTER — Telehealth: Payer: Self-pay | Admitting: *Deleted

## 2013-12-31 NOTE — Telephone Encounter (Signed)
Pt called requesting Allegra 180mg  refill.  Medication not on current med list.

## 2014-01-01 ENCOUNTER — Other Ambulatory Visit: Payer: Self-pay | Admitting: *Deleted

## 2014-01-01 MED ORDER — FEXOFENADINE HCL 180 MG PO TABS: 180.0000 mg | ORAL_TABLET | Freq: Every day | ORAL | Status: DC

## 2014-01-01 NOTE — Telephone Encounter (Signed)
Spoke with pt advised Rx printed ready for pick up.

## 2014-01-01 NOTE — Telephone Encounter (Signed)
done

## 2014-01-02 ENCOUNTER — Encounter: Payer: Self-pay | Admitting: Internal Medicine

## 2014-01-02 ENCOUNTER — Ambulatory Visit (INDEPENDENT_AMBULATORY_CARE_PROVIDER_SITE_OTHER): Payer: Medicare Other | Admitting: Internal Medicine

## 2014-01-02 VITALS — BP 110/72 | HR 66 | Temp 97.8°F

## 2014-01-02 DIAGNOSIS — R42 Dizziness and giddiness: Secondary | ICD-10-CM

## 2014-01-02 DIAGNOSIS — E785 Hyperlipidemia, unspecified: Secondary | ICD-10-CM | POA: Diagnosis not present

## 2014-01-02 DIAGNOSIS — J309 Allergic rhinitis, unspecified: Secondary | ICD-10-CM

## 2014-01-02 DIAGNOSIS — I10 Essential (primary) hypertension: Secondary | ICD-10-CM | POA: Diagnosis not present

## 2014-01-02 MED ORDER — PREDNISONE (PAK) 10 MG PO TABS: ORAL_TABLET | ORAL | Status: DC

## 2014-01-02 NOTE — Progress Notes (Signed)
Pre visit review using our clinic review tool, if applicable. No additional management support is needed unless otherwise documented below in the visit note. 

## 2014-01-02 NOTE — Progress Notes (Signed)
Subjective:    Patient ID: Erica [name] Alert, female    DOB: 08-25-1956, 58 y.o.   MRN: [id number]  Fall The accident occurred 5 to 7 days ago. The fall occurred while standing. She fell from a height of 1 to 2 ft. She landed on hard floor. There was no blood loss. The symptoms are aggravated by standing. Pertinent negatives include no fever, headaches, loss of consciousness or nausea. Associated symptoms comments: Dizziness and allergy sx. She has tried nothing for the symptoms.    Past Medical History  Diagnosis Date  . Hypertension   . ALLERGIC RHINITIS   . Sleep apnea     CPAP hs  . PAF (paroxysmal atrial fibrillation)   . Dyslipidemia   . Panic attacks     Hx of depression  . Osteoarthritis   . Obesity   . Depression   . External hemorrhoids     Review of Systems  Constitutional: Positive for fatigue. Negative for fever and unexpected weight change.  HENT: Positive for postnasal drip, rhinorrhea, sinus pressure and sneezing.   Eyes: Positive for itching.  Respiratory: Negative for cough and shortness of breath.   Gastrointestinal: Negative for nausea.  Neurological: Positive for dizziness and light-headedness. Negative for seizures, loss of consciousness, facial asymmetry and headaches.       Objective:   Physical Exam  BP 110/72  Pulse 66  Temp(Src) 97.8 F (36.6 C) (Oral)  SpO2 97% Wt Readings from Last 3 Encounters:  11/28/13 245 lb 6.4 oz (111.313 kg)  10/07/13 244 lb (110.678 kg)  09/20/13 231 lb (104.781 kg)    Constitutional: She appears well-developed and well-nourished. No distress.  HENT: NCAT, min sinus tenderness -OP clear, TMs hazy without erythema or cerumen impaction Eyes: PERRL, EOMI - mild resting horizontal nystagmus bilaterally  Neck: Normal range of motion. Neck supple. No JVD present. No thyromegaly present.  Cardiovascular: Normal rate, regular rhythm and normal heart sounds.  No murmur heard. No BLE edema. Pulmonary/Chest: Effort  normal and breath sounds normal. No respiratory distress. She has no wheezes.  Neuro: AAOx4, CN2-12 symmetrically intact -poor equilibrium with F to N to L>R, gait wide balanced stand with assist Psychiatric: She has a normal mood and affect. Her behavior is normal. Judgment and thought content normal.   Lab Results  Component Value Date   WBC 6.2 03/20/2013   HGB 12.5 03/20/2013   HCT 37.9 03/20/2013   PLT 241.0 03/20/2013   GLUCOSE 103* 03/20/2013   CHOL 155 07/13/2012   TRIG 117.0 07/13/2012   HDL 40.10 07/13/2012   LDLCALC 92 07/13/2012   ALT 13 07/13/2012   AST 17 07/13/2012   NA 141 03/20/2013   K 3.5 03/20/2013   CL 106 03/20/2013   CREATININE 0.7 03/20/2013   BUN 11 03/20/2013   CO2 27 03/20/2013   TSH 3.27 07/13/2012   HGBA1C 6.2 01/23/2009    Dg Ankle Complete Left  07/25/2013   CLINICAL DATA:  History of fall complaining of left ankle pain and swelling.  EXAM: LEFT ANKLE COMPLETE - 3+ VIEW  COMPARISON:  No priors.  FINDINGS: Diffuse soft tissue swelling surrounding the left ankle joint is noted. No underlying displaced fracture, subluxation or dislocation is noted. A small plantar calcaneal spur is incidentally noted.  IMPRESSION: 1. Diffuse soft tissue swelling around the left ankle joint without definite underlying acute bony abnormality.   Electronically Signed   By: Vinnie Langton M.D.   On: 07/25/2013 17:00  Assessment & Plan:    Severe vertigo with falls x 2 and imbalanced gait Overlapping ENT symptoms and high risk AR with eustachian dysfunction or BPPV However, check MRI brain due to abn neuro exam (nystgmus nd gait change) Several CRF for CVA reviewed Also empiric pred pak for inflammation of potential eustachian tube dysfunction in addition to ongoing antihistamine therapy and nasal steroids for allergic rhinitis  Problem List Items Addressed This Visit   ALLERGIC RHINITIS     Continue flonase and oral antihistamine -  Prior ENT evaluation and allergist  evaluation for same Singulair trial ineffective Short course prednisone for anti-inflammatory in the setting of eustachian tube dysfunction and increased vertigo, see above     Essential hypertension, benign      The current medical regimen is effective;  continue present plan and medications. BP Readings from Last 3 Encounters:  01/02/14 110/72  11/28/13 144/62  09/20/13 100/80      HYPERLIPIDEMIA      The current medical regimen is effective (simva, fish oil);  continue present plan and medications. Check labs annually Lab Results  Component Value Date   LDLCALC 92 07/13/2012   Lab Results  Component Value Date   ALT 13 07/13/2012   AST 17 07/13/2012   ALKPHOS 140* 07/13/2012   BILITOT 0.7 07/13/2012        Other Visit Diagnoses   Vertigo    -  Primary

## 2014-01-02 NOTE — Patient Instructions (Addendum)
It was good to see you today.  We have reviewed your prior records including labs and tests today  Test(s) ordered today -myalgias will call for this appointment once arranged for MRI. Your results will be released to [place] (or called to you) after review, usually within 72hours after test completion. If any changes need to be made, you will be notified at that same time.  Medications reviewed and updated Begin prednsione taper over next 6 days for inflammation and allergy symptoms No other changes recommended at this time.  Please schedule followup in 3-4 months for annual exam and labs, call sooner if problems.  Vertigo Vertigo means you feel like you or your surroundings are moving when they are not. Vertigo can be dangerous if it occurs when you are at work, driving, or performing difficult activities.  CAUSES  Vertigo occurs when there is a conflict of signals sent to your brain from the visual and sensory systems in your body. There are many different causes of vertigo, including:  Infections, especially in the inner ear.  A bad reaction to a drug or misuse of alcohol and medicines.  Withdrawal from drugs or alcohol.  Rapidly changing positions, such as lying down or rolling over in bed.  A migraine headache.  Decreased blood flow to the brain.  Increased pressure in the brain from a head injury, infection, tumor, or bleeding. SYMPTOMS  You may feel as though the world is spinning around or you are falling to the ground. Because your balance is upset, vertigo can cause nausea and vomiting. You may have involuntary eye movements (nystagmus). DIAGNOSIS  Vertigo is usually diagnosed by physical exam. If the cause of your vertigo is unknown, your caregiver may perform imaging tests, such as an MRI scan (magnetic resonance imaging). TREATMENT  Most cases of vertigo resolve on their own, without treatment. Depending on the cause, your caregiver may prescribe certain medicines. If  your vertigo is related to body position issues, your caregiver may recommend movements or procedures to correct the problem. In rare cases, if your vertigo is caused by certain inner ear problems, you may need surgery. HOME CARE INSTRUCTIONS   Follow your caregiver's instructions.  Avoid driving.  Avoid operating heavy machinery.  Avoid performing any tasks that would be dangerous to you or others during a vertigo episode.  Tell your caregiver if you notice that certain medicines seem to be causing your vertigo. Some of the medicines used to treat vertigo episodes can actually make them worse in some people. SEEK IMMEDIATE MEDICAL CARE IF:   Your medicines do not relieve your vertigo or are making it worse.  You develop problems with talking, walking, weakness, or using your arms, hands, or legs.  You develop severe headaches.  Your nausea or vomiting continues or gets worse.  You develop visual changes.  A family member notices behavioral changes.  Your condition gets worse. MAKE SURE YOU:  Understand these instructions.  Will watch your condition.  Will get help right away if you are not doing well or get worse. Document Released: 07/07/2005 Document Revised: 12/20/2011 Document Reviewed: 04/15/2011 [facility] Patient Information 2014 [place].

## 2014-01-02 NOTE — Progress Notes (Signed)
Erica [name] [id number] 01/02/2014   Chief Complaint  Patient presents with  . Ear Problem  . Fall    twice, bruise (R) leg    Subjective  HPI Comments: Ear Problems:  Symptoms started on Monday: L ear sounds like a washing machine and the R feels itchy.  L ear does feel like there is increased pressure.  Feels dizzy - This has started since the ear symptoms started.  Monday "felt dizzy and fell at [employer]."  Has had several episodes since.  She denies variable blood sugar.  She has a history of vertigo this winter.  She had to flu and was dehydrated at the time.  Dizziness lasts several seconds to about a minute.  It is not worse with movement.  Dizziness is worse when plug nose and blow to clear ears.  Allegra, nasal spray, and eye drops help some with allergies.  She has had clear rhinorrhea.  No severe HA, LOC.  Mother has had similar symptoms in the past -  She lives in [place].   Past Medical History  Diagnosis Date  . Hypertension   . ALLERGIC RHINITIS   . Sleep apnea     CPAP hs  . PAF (paroxysmal atrial fibrillation)   . Dyslipidemia   . Panic attacks     Hx of depression  . Osteoarthritis   . Obesity   . Depression   . External hemorrhoids     Past Surgical History  Procedure Laterality Date  . Replacement total knee Right   . Abdominal hysterectomy    . Right oophorectomy      No cancer  . Child birth      x's 3    Family History  Problem Relation Age of Onset  . Heart disease Mother   . Dementia Father   . Allergies Sister   . Asthma Son   . Breast cancer Maternal Aunt   . Asthma Other   . Colon cancer Cousin     History  Substance Use Topics  . Smoking status: Never Smoker   . Smokeless tobacco: Never Used     Comment: Married with children. 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 Alcohol Use: No    Current Outpatient Prescriptions on File Prior to Visit  Medication Sig Dispense Refill  . ALPRAZolam (XANAX) 1 MG  tablet Take 0.5-1 tablets (0.5-1 mg total) by mouth at bedtime as needed for sleep or anxiety.  180 tablet  0  . atenolol (TENORMIN) 50 MG tablet Take 1 tablet (50 mg total) by mouth daily.  90 tablet  3  . cyclobenzaprine (FLEXERIL) 10 MG tablet Take 1 tablet (10 mg total) by mouth 3 (three) times daily as needed for muscle spasms. For pain.  90 tablet  0  . fexofenadine (ALLEGRA) 180 MG tablet Take 1 tablet (180 mg total) by mouth daily.  90 tablet  3  . fluticasone (FLONASE) 50 MCG/ACT nasal spray Place 1 spray into the nose as needed.      . furosemide (LASIX) 40 MG tablet TAKE 1 TABLET (40 MG TOTAL) BY MOUTH 2 (TWO) TIMES DAILY.  180 tablet  3  . hydrocortisone (ANUSOL-HC) 2.5 % rectal cream Place rectally as needed.      . meloxicam (MOBIC) 15 MG tablet Take 15 mg by mouth as needed.      Marland Kitchen omeprazole (PRILOSEC) 20 MG capsule Take 1 capsule (20 mg total) by mouth daily.  30 capsule  0  . oxycodone (OXY-IR) 5 MG capsule Take 5 mg by mouth every 4 (four) hours as needed. For pain.      Marland Kitchen PARoxetine (PAXIL) 10 MG tablet Take 1 tablet (10 mg total) by mouth every morning.  90 tablet  3  . polyethylene glycol powder (GLYCOLAX/MIRALAX) powder Take 17 g by mouth daily.  3350 g  1  . potassium chloride SA (K-DUR,KLOR-CON) 20 MEQ tablet TAKE 1 TABLET BY MOUTH TWICE DAILY  180 tablet  3  . promethazine (PHENERGAN) 25 MG tablet Take 1 tablet (25 mg total) by mouth every 6 (six) hours as needed for nausea or vomiting.  30 tablet  1  . simvastatin (ZOCOR) 40 MG tablet TAKE ONE TABLET BY MOUTH DAILY  90 tablet  3   No current facility-administered medications on file prior to visit.    Allergies:  Allergies  Allergen Reactions  . Guaifenesin-Codeine     REACTION: feels spacey  . Bupropion Palpitations    Review of Systems  Constitutional: Negative for fever, chills and weight loss.  HENT: Positive for ear pain. Negative for ear discharge.   Eyes: Negative for blurred vision and double vision.    Respiratory: Negative for shortness of breath and wheezing.   Cardiovascular: Negative for chest pain and palpitations.  Neurological: Positive for dizziness. Negative for focal weakness, seizures, loss of consciousness and headaches.       Objective  Filed Vitals:   01/02/14 1344  BP: 110/72  Pulse: 66  Temp: 97.8 F (36.6 C)  TempSrc: Oral  SpO2: 97%    Physical Exam  Constitutional: She is oriented to person, place, and time. She appears well-developed and well-nourished.  HENT:  Mild distension of TMs w/ small amount of fluid b/l.  Cardiovascular: Normal rate, regular rhythm and normal heart sounds.   Respiratory: Effort normal and breath sounds normal. No respiratory distress. She has no wheezes. She has no rales.  Neurological: She is alert and oriented to person, place, and time.  Mild horizontal nystagmus w/ EOM UOQ b/l.  Finger to nose missed on R side by 1-2" x2.  Psychiatric: She has a normal mood and affect. Her behavior is normal. Thought content normal.    BP Readings from Last 3 Encounters:  01/02/14 110/72  11/28/13 144/62  09/20/13 100/80    Wt Readings from Last 3 Encounters:  11/28/13 245 lb 6.4 oz (111.313 kg)  10/07/13 244 lb (110.678 kg)  09/20/13 231 lb (104.781 kg)    Lab Results  Component Value Date   WBC 6.2 03/20/2013   HGB 12.5 03/20/2013   HCT 37.9 03/20/2013   PLT 241.0 03/20/2013   GLUCOSE 103* 03/20/2013   CHOL 155 07/13/2012   TRIG 117.0 07/13/2012   HDL 40.10 07/13/2012   LDLCALC 92 07/13/2012   ALT 13 07/13/2012   AST 17 07/13/2012   NA 141 03/20/2013   K 3.5 03/20/2013   CL 106 03/20/2013   CREATININE 0.7 03/20/2013   BUN 11 03/20/2013   CO2 27 03/20/2013   TSH 3.27 07/13/2012   HGBA1C 6.2 01/23/2009    Dg Ankle Complete Left  07/25/2013   CLINICAL DATA:  History of fall complaining of left ankle pain and swelling.  EXAM: LEFT ANKLE COMPLETE - 3+ VIEW  COMPARISON:  No priors.  FINDINGS: Diffuse soft tissue swelling surrounding the  left ankle joint is noted. No underlying displaced fracture, subluxation or dislocation is noted. A small plantar calcaneal spur is  incidentally noted.  IMPRESSION: 1. Diffuse soft tissue swelling around the left ankle joint without definite underlying acute bony abnormality.   Electronically Signed   By: Vinnie Langton M.D.   On: 07/25/2013 17:00     Assessment and Plan  Vertigo/Ear Pain: Eustachian tube dysfunction secondary to seasonal allergies.   R/O vascular problem, neoplasm, or other abnormality w/ MRI.  Patient was instructed to notify the office if any new symptoms emerged or the current symptoms become worse.   Return in about 3 months (around 04/04/2014) for annual exam and labs.  Wynelle Cleveland   I have personally reviewed this case with PA student. I also personally examined this patient. I agree with history and findings as documented above. I reviewed, discussed and approve of the assessment and plan as listed above. Gwendolyn Grant, MD

## 2014-01-02 NOTE — Assessment & Plan Note (Signed)
The current medical regimen is effective;  continue present plan and medications. BP Readings from Last 3 Encounters:  01/02/14 110/72  11/28/13 144/62  09/20/13 100/80

## 2014-01-02 NOTE — Assessment & Plan Note (Signed)
Continue flonase and oral antihistamine -  Prior ENT evaluation and allergist evaluation for same Singulair trial ineffective Short course prednisone for anti-inflammatory in the setting of eustachian tube dysfunction and increased vertigo, see above

## 2014-01-02 NOTE — Assessment & Plan Note (Signed)
The current medical regimen is effective (simva, fish oil);  continue present plan and medications. Check labs annually Lab Results  Component Value Date   LDLCALC 92 07/13/2012   Lab Results  Component Value Date   ALT 13 07/13/2012   AST 17 07/13/2012   ALKPHOS 140* 07/13/2012   BILITOT 0.7 07/13/2012

## 2014-01-05 ENCOUNTER — Other Ambulatory Visit: Payer: Medicare Other

## 2014-01-07 ENCOUNTER — Ambulatory Visit
Admission: RE | Admit: 2014-01-07 | Discharge: 2014-01-07 | Disposition: A | Discharge status: Home / Self Care | Payer: Medicare Other | Source: Ambulatory Visit | Attending: Internal Medicine | Admitting: Internal Medicine

## 2014-01-07 DIAGNOSIS — J309 Allergic rhinitis, unspecified: Secondary | ICD-10-CM

## 2014-01-07 DIAGNOSIS — I10 Essential (primary) hypertension: Secondary | ICD-10-CM

## 2014-01-07 DIAGNOSIS — R42 Dizziness and giddiness: Secondary | ICD-10-CM | POA: Diagnosis not present

## 2014-01-07 DIAGNOSIS — E785 Hyperlipidemia, unspecified: Secondary | ICD-10-CM

## 2014-01-23 ENCOUNTER — Ambulatory Visit (INDEPENDENT_AMBULATORY_CARE_PROVIDER_SITE_OTHER): Payer: Medicare Other | Admitting: Internal Medicine

## 2014-01-23 ENCOUNTER — Other Ambulatory Visit (INDEPENDENT_AMBULATORY_CARE_PROVIDER_SITE_OTHER): Payer: Medicare Other

## 2014-01-23 ENCOUNTER — Encounter: Payer: Self-pay | Admitting: Internal Medicine

## 2014-01-23 VITALS — BP 130/70 | HR 68 | Ht 66.0 in | Wt 243.1 lb

## 2014-01-23 DIAGNOSIS — R143 Flatulence: Secondary | ICD-10-CM

## 2014-01-23 DIAGNOSIS — K5900 Constipation, unspecified: Secondary | ICD-10-CM

## 2014-01-23 DIAGNOSIS — R142 Eructation: Secondary | ICD-10-CM

## 2014-01-23 DIAGNOSIS — R140 Abdominal distension (gaseous): Secondary | ICD-10-CM

## 2014-01-23 DIAGNOSIS — R141 Gas pain: Secondary | ICD-10-CM | POA: Diagnosis not present

## 2014-01-23 LAB — COMPREHENSIVE METABOLIC PANEL
ALT: 15 U/L (ref 0–35)
AST: 17 U/L (ref 0–37)
Albumin: 3.6 g/dL (ref 3.5–5.2)
Alkaline Phosphatase: 127 U/L — ABNORMAL HIGH (ref 39–117)
BUN: 16 mg/dL (ref 6–23)
CO2: 31 mEq/L (ref 19–32)
Calcium: 9.3 mg/dL (ref 8.4–10.5)
Chloride: 105 mEq/L (ref 96–112)
Creatinine, Ser: 0.8 mg/dL (ref 0.4–1.2)
GFR: 95.42 mL/min (ref >60.00)
Glucose, Bld: 96 mg/dL (ref 70–99)
Potassium: 4.1 mEq/L (ref 3.5–5.1)
Sodium: 140 mEq/L (ref 135–145)
Total Bilirubin: 0.8 mg/dL (ref 0.3–1.2)
Total Protein: 7.4 g/dL (ref 6.0–8.3)

## 2014-01-23 MED ORDER — LACTULOSE 10 GM/15ML PO SOLN: 10.0000 g | Freq: Every day | ORAL | Status: DC

## 2014-01-23 MED ORDER — NA SULFATE-K SULFATE-MG SULF 17.5-3.13-1.6 GM/177ML PO SOLN: ORAL | Status: DC

## 2014-01-23 MED ORDER — LINACLOTIDE 145 MCG PO CAPS: 145.0000 ug | ORAL_CAPSULE | Freq: Every day | ORAL | Status: DC

## 2014-01-23 NOTE — Progress Notes (Signed)
Erica [name] 03/09/1956 [id number]  Note: This dictation was prepared with Dragon digital system. Any transcriptional errors that result from this procedure are unintentional.   History of Present Illness:  This is a 58-year-old African American female c/o bloating and crampy lower abdominal pain associated with constipation. She has been disabled due to arthritis of the knees and back. We saw her in July 2005 for a screening colonoscopy which was normal. She has a family history of colon cancer in her cousin. She has been on MiraLax 17 g daily for constipation without satisfactory results. She has also taken milk of magnesia, Dulcolax and  herbal laxatives. She denies rectal bleeding. She is overweight. She claims to have good eating habits. In the past, her alkaline phosphatase was elevated to 140.    Past Medical History  Diagnosis Date  . Hypertension   . ALLERGIC RHINITIS   . Sleep apnea     CPAP hs  . PAF (paroxysmal atrial fibrillation)   . Dyslipidemia   . Panic attacks     Hx of depression  . Osteoarthritis   . Obesity   . Depression   . External hemorrhoids     Past Surgical History  Procedure Laterality Date  . Replacement total knee Right   . Abdominal hysterectomy    . Right oophorectomy      No cancer  . Child birth      x's 3    Allergies  Allergen Reactions  . Codeine   . Guaifenesin-Codeine     REACTION: feels spacey  . Wellbutrin [Bupropion] Palpitations    Family history and social history have been reviewed.  Review of Systems: Denies heartburn dysphagia  The remainder of the 10 point ROS is negative except as outlined in the H&P  Physical Exam: General Appearance Well developed, in no distress morbidly obese  Eyes  Non icteric  HEENT  Non traumatic, normocephalic  Mouth No lesion, tongue papillated, no cheilosis Neck Supple without adenopathy, thyroid not enlarged, no carotid bruits, no JVD Lungs Clear to auscultation bilaterally COR  Normal S1, normal S2, regular rhythm, no murmur, quiet precordium Abdomen obese soft tender in left lower quadrant and in suprapubic area. No palpable mass. Exam is limited due to patient's size. Bowel sounds are active Rectal small amount of salt Hemoccult negative stool Extremities  No pedal edema Skin No lesions Neurological Alert and oriented x 3 Psychological Normal mood and affect  Assessment and Plan:   Problem #1 Functional constipation and crampy lower abdominal pain. We may be dealing with an irritable bowel syndrome or slow transit constipation. Since the MiraLax has not been successful, we will start a combination of Linzess 145 mcg daily and lactulose 10 g daily. We will be scheduling her for a colonoscopy. She is due for a screening.  Problem #2 Bloating and dyspepsia. We will obtain an upper abdominal ultrasound and will repeat her liver function tests as well.Hx of elevated alk. Phosph. may be due to fatty liver or biliary disease.    Erica [name] 01/23/2014      

## 2014-01-23 NOTE — Patient Instructions (Addendum)
You have been scheduled for a colonoscopy with propofol. Please follow written instructions given to you at your visit today.  Please pick up your prep kit at the pharmacy within the next 1-3 days. If you use inhalers (even only as needed), please bring them with you on the day of your procedure. Your physician has requested that you go to www.startemmi.com and enter the access code given to you at your visit today. This web site gives a general overview about your procedure. However, you should still follow specific instructions given to you by our office regarding your preparation for the procedure.  You have been scheduled for an abdominal ultrasound at [facility] (1st floor of hospital) on Monday, 01/28/14 at 9:30 am. Please arrive 15 minutes prior to your appointment for registration. Make certain not to have anything to eat or drink 6 hours prior to your appointment. Should you need to reschedule your appointment, please contact radiology at [phone]. This test typically takes about 30 minutes to perform.  We have given you a prescription for the following medications to take your pharmacy: Linzess-1 tablet daily Lactulose-15 ml (1 tablespoon) daily  Your physician has requested that you go to the basement for the following lab work before leaving today: CMET  CC:Dr Asa Lente, Kathleen

## 2014-01-24 ENCOUNTER — Encounter: Payer: Self-pay | Admitting: Internal Medicine

## 2014-01-28 ENCOUNTER — Ambulatory Visit (HOSPITAL_COMMUNITY)
Admission: RE | Admit: 2014-01-28 | Discharge: 2014-01-28 | Disposition: A | Discharge status: Home / Self Care | Payer: Medicare Other | Source: Ambulatory Visit | Attending: Internal Medicine | Admitting: Internal Medicine

## 2014-01-28 DIAGNOSIS — R141 Gas pain: Secondary | ICD-10-CM | POA: Insufficient documentation

## 2014-01-28 DIAGNOSIS — R142 Eructation: Principal | ICD-10-CM

## 2014-01-28 DIAGNOSIS — R140 Abdominal distension (gaseous): Secondary | ICD-10-CM

## 2014-01-28 DIAGNOSIS — R109 Unspecified abdominal pain: Secondary | ICD-10-CM | POA: Insufficient documentation

## 2014-01-28 DIAGNOSIS — R143 Flatulence: Secondary | ICD-10-CM | POA: Diagnosis not present

## 2014-02-05 DIAGNOSIS — R1032 Left lower quadrant pain: Secondary | ICD-10-CM | POA: Diagnosis not present

## 2014-03-20 ENCOUNTER — Telehealth: Payer: Self-pay | Admitting: Internal Medicine

## 2014-03-20 ENCOUNTER — Encounter: Payer: Medicare Other | Admitting: Internal Medicine

## 2014-03-20 NOTE — Telephone Encounter (Signed)
Please charge no show 

## 2014-03-20 NOTE — Telephone Encounter (Signed)
Called patient back and she states that she has already talked to several regarding this note.  She states that her husband answered the phone yesterday evening when Judi was calling to remind her of procedure.  She was confused and thought her procedure was June 25th.  She did not attempt to call our office yesterday to confirm.  I advised her that I would forward this to Dr. Olevia Perches and stated that it would be up to the doctor if she will be charged.  She was transferred down to third floor to [facility] to reschedule and to schedule a pre-visit with nurse.

## 2014-03-27 DIAGNOSIS — M5137 Other intervertebral disc degeneration, lumbosacral region: Secondary | ICD-10-CM | POA: Diagnosis not present

## 2014-03-27 DIAGNOSIS — IMO0002 Reserved for concepts with insufficient information to code with codable children: Secondary | ICD-10-CM | POA: Diagnosis not present

## 2014-03-27 DIAGNOSIS — M545 Low back pain, unspecified: Secondary | ICD-10-CM | POA: Diagnosis not present

## 2014-04-22 ENCOUNTER — Other Ambulatory Visit (INDEPENDENT_AMBULATORY_CARE_PROVIDER_SITE_OTHER): Payer: Medicare Other

## 2014-04-22 ENCOUNTER — Encounter: Payer: Medicare Other | Admitting: Internal Medicine

## 2014-04-22 ENCOUNTER — Ambulatory Visit (INDEPENDENT_AMBULATORY_CARE_PROVIDER_SITE_OTHER): Payer: Medicare Other | Admitting: Internal Medicine

## 2014-04-22 ENCOUNTER — Encounter: Payer: Self-pay | Admitting: Internal Medicine

## 2014-04-22 VITALS — BP 118/80 | HR 98 | Temp 98.2°F | Ht 66.0 in | Wt 248.0 lb

## 2014-04-22 DIAGNOSIS — F331 Major depressive disorder, recurrent, moderate: Secondary | ICD-10-CM

## 2014-04-22 DIAGNOSIS — H8110 Benign paroxysmal vertigo, unspecified ear: Secondary | ICD-10-CM

## 2014-04-22 DIAGNOSIS — Z79899 Other long term (current) drug therapy: Secondary | ICD-10-CM | POA: Diagnosis not present

## 2014-04-22 DIAGNOSIS — E785 Hyperlipidemia, unspecified: Secondary | ICD-10-CM | POA: Diagnosis not present

## 2014-04-22 DIAGNOSIS — H938X9 Other specified disorders of ear, unspecified ear: Secondary | ICD-10-CM | POA: Diagnosis not present

## 2014-04-22 DIAGNOSIS — H938X2 Other specified disorders of left ear: Secondary | ICD-10-CM

## 2014-04-22 DIAGNOSIS — H8112 Benign paroxysmal vertigo, left ear: Secondary | ICD-10-CM

## 2014-04-22 LAB — HEPATIC FUNCTION PANEL
ALT: 17 U/L (ref 0–35)
AST: 19 U/L (ref 0–37)
Albumin: 3.7 g/dL (ref 3.5–5.2)
Alkaline Phosphatase: 117 U/L (ref 39–117)
Bilirubin, Direct: 0.1 mg/dL (ref 0.0–0.3)
Total Bilirubin: 0.6 mg/dL (ref 0.2–1.2)
Total Protein: 7.3 g/dL (ref 6.0–8.3)

## 2014-04-22 LAB — CBC WITH DIFFERENTIAL/PLATELET
Basophils Absolute: 0 10*3/uL (ref 0.0–0.1)
Basophils Relative: 0 % (ref 0.0–3.0)
Eosinophils Absolute: 0.1 10*3/uL (ref 0.0–0.7)
Eosinophils Relative: 1.5 % (ref 0.0–5.0)
HCT: 36 % (ref 36.0–46.0)
Hemoglobin: 11.8 g/dL — ABNORMAL LOW (ref 12.0–15.0)
Lymphocytes Relative: 28.6 % (ref 12.0–46.0)
Lymphs Abs: 2 10*3/uL (ref 0.7–4.0)
MCHC: 32.7 g/dL (ref 30.0–36.0)
MCV: 86 fl (ref 78.0–100.0)
Monocytes Absolute: 0.6 10*3/uL (ref 0.1–1.0)
Monocytes Relative: 8.9 % (ref 3.0–12.0)
Neutro Abs: 4.3 10*3/uL (ref 1.4–7.7)
Neutrophils Relative %: 61 % (ref 43.0–77.0)
Platelets: 223 10*3/uL (ref 150.0–400.0)
RBC: 4.19 Mil/uL (ref 3.87–5.11)
RDW: 14.6 % (ref 11.5–15.5)
WBC: 7.1 10*3/uL (ref 4.0–10.5)

## 2014-04-22 LAB — BASIC METABOLIC PANEL
BUN: 15 mg/dL (ref 6–23)
CO2: 27 mEq/L (ref 19–32)
Calcium: 9 mg/dL (ref 8.4–10.5)
Chloride: 111 mEq/L (ref 96–112)
Creatinine, Ser: 0.8 mg/dL (ref 0.4–1.2)
GFR: 95.34 mL/min (ref >60.00)
Glucose, Bld: 89 mg/dL (ref 70–99)
Potassium: 4 mEq/L (ref 3.5–5.1)
Sodium: 142 mEq/L (ref 135–145)

## 2014-04-22 LAB — LIPID PANEL
Cholesterol: 165 mg/dL (ref 0–200)
HDL: 43.1 mg/dL (ref >39.00)
LDL Cholesterol: 87 mg/dL (ref 0–99)
NonHDL: 121.9
Total CHOL/HDL Ratio: 4
Triglycerides: 174 mg/dL — ABNORMAL HIGH (ref 0.0–149.0)
VLDL: 34.8 mg/dL (ref 0.0–40.0)

## 2014-04-22 MED ORDER — ALPRAZOLAM 1 MG PO TABS: 0.5000 mg | ORAL_TABLET | Freq: Every evening | ORAL | Status: DC | PRN

## 2014-04-22 MED ORDER — MECLIZINE HCL 25 MG PO TABS: 25.0000 mg | ORAL_TABLET | Freq: Three times a day (TID) | ORAL | Status: DC | PRN

## 2014-04-22 NOTE — Assessment & Plan Note (Signed)
The current medical regimen is effective (simva, fish oil);  continue present plan and medications. Check labs annually Lab Results  Component Value Date   LDLCALC 92 07/13/2012   Lab Results  Component Value Date   ALT 15 01/23/2014   AST 17 01/23/2014   ALKPHOS 127* 01/23/2014   BILITOT 0.8 01/23/2014

## 2014-04-22 NOTE — Patient Instructions (Addendum)
It was good to see you today.  We have reviewed your prior records including labs and tests today  Test(s) ordered today. Your results will be released to [place] (or called to you) after review, usually within 72hours after test completion. If any changes need to be made, you will be notified at that same time.  Medications reviewed and updated, meclizine as needed for dizziness, no other changes recommended at this time. Refill on medication(s) as discussed today.  we'll make referral to ENT specialist for your ear symptoms . Our office will contact you regarding appointment(s) once made.  Please schedule followup in 6 months, call sooner if problems.

## 2014-04-22 NOTE — Progress Notes (Signed)
Pre visit review using our clinic review tool, if applicable. No additional management support is needed unless otherwise documented below in the visit note. 

## 2014-04-22 NOTE — Assessment & Plan Note (Signed)
On generic Paxil and when necessary Xanax for same Check UDS with assured toxicology per protocol/controlled substance contract

## 2014-04-22 NOTE — Progress Notes (Signed)
Subjective:    Patient ID: Erica [name], female    DOB: 09-07-1956, 58 y.o.   MRN: [id number]  Ear Fullness  Pertinent negatives include no ear discharge, headaches or hearing loss.    Patient is here for follow up -L ear problems and dizzy Reviewed chronic medical issues and interval medical events  Past Medical History  Diagnosis Date  . Hypertension   . ALLERGIC RHINITIS   . Sleep apnea     CPAP hs  . PAF (paroxysmal atrial fibrillation)     pt denies  . Dyslipidemia   . Panic attacks     Hx of depression  . Osteoarthritis   . Obesity   . Depression   . External hemorrhoids   . GERD (gastroesophageal reflux disease)     Review of Systems  Constitutional: Positive for fatigue. Negative for fever.  HENT: Positive for sinus pressure and tinnitus (left). Negative for ear discharge and hearing loss. Ear pain: "full" on Left - hypersensitive hearing.   Neurological: Positive for dizziness and numbness. Negative for facial asymmetry, weakness, light-headedness and headaches.       Objective:   Physical Exam  BP 118/80  Pulse 98  Temp(Src) 98.2 F (36.8 C) (Oral)  Ht 5\' 6"  (1.676 m)  Wt 248 lb (112.492 kg)  BMI 40.05 kg/m2  SpO2 97% Wt Readings from Last 3 Encounters:  04/22/14 248 lb (112.492 kg)  01/23/14 243 lb 2 oz (110.281 kg)  11/28/13 245 lb 6.4 oz (111.313 kg)   Constitutional: She appears well-developed and well-nourished. No distress.  Eyes: no nystagmus ENT: TMs clear without effusion or erythema Neck: Normal range of motion. Neck supple. No JVD present. No thyromegaly present.  Cardiovascular: Normal rate, regular rhythm and normal heart sounds.  No murmur heard. No BLE edema. Pulmonary/Chest: Effort normal and breath sounds normal. No respiratory distress. She has no wheezes.  Neuro: AAOx4, CN2-12 sym intact Psychiatric: She has a normal mood and affect. Her behavior is normal. Judgment and thought content normal.   Lab Results  Component  Value Date   WBC 6.2 03/20/2013   HGB 12.5 03/20/2013   HCT 37.9 03/20/2013   PLT 241.0 03/20/2013   GLUCOSE 96 01/23/2014   CHOL 155 07/13/2012   TRIG 117.0 07/13/2012   HDL 40.10 07/13/2012   LDLCALC 92 07/13/2012   ALT 15 01/23/2014   AST 17 01/23/2014   NA 140 01/23/2014   K 4.1 01/23/2014   CL 105 01/23/2014   CREATININE 0.8 01/23/2014   BUN 16 01/23/2014   CO2 31 01/23/2014   TSH 3.27 07/13/2012   HGBA1C 6.2 01/23/2009    US Abdomen Complete  01/28/2014   CLINICAL DATA:  Bloating, abdominal pain  EXAM: ULTRASOUND ABDOMEN COMPLETE  COMPARISON:  None.  FINDINGS: Gallbladder:  No gallstones or wall thickening visualized. No sonographic Murphy sign noted.  Common bile duct:  Diameter:  3 mm  Liver:  No focal lesion identified. Within normal limits in parenchymal echogenicity.  IVC:  No abnormality visualized.  Pancreas:  Visualized portion unremarkable.  Spleen:  Size and appearance within normal limits.  Right Kidney:  Length: 10.3 cm. Echogenicity within normal limits. No mass or hydronephrosis visualized.  Left Kidney:  Length: 10.3 cm. Echogenicity within normal limits. No mass or hydronephrosis visualized.  Abdominal aorta:  No aneurysm visualized.  Other findings:  None.  IMPRESSION: Normal abdominal ultrasound.   Electronically Signed   By: [name]   On: 01/28/2014 09:39  Assessment & Plan:   L ear fullness with vertigo, recurrent - similar to symptoms 12/2013 when MRI negative for CVA or mass  Exam today (ENT and neuro) benign Resume meclizine, intol of pred pak as rx'd 12/2013 Check labs and refer to ENT  Problem List Items Addressed This Visit   Depression, major, recurrent     On generic Paxil and when necessary Xanax for same Check UDS with assured toxicology per protocol/controlled substance contract    Relevant Medications      ALPRAZolam (XANAX) tablet   HYPERLIPIDEMIA      The current medical regimen is effective (simva, fish oil);  continue present plan and  medications. Check labs annually Lab Results  Component Value Date   LDLCALC 92 07/13/2012   Lab Results  Component Value Date   ALT 15 01/23/2014   AST 17 01/23/2014   ALKPHOS 127* 01/23/2014   BILITOT 0.8 01/23/2014      Relevant Orders      Lipid panel    Other Visit Diagnoses   Ear fullness, left    -  Primary    Relevant Orders       Ambulatory referral to ENT       Basic metabolic panel       CBC with Differential       Hepatic function panel    Benign paroxysmal positional vertigo, left        Relevant Orders       Ambulatory referral to ENT       Basic metabolic panel       CBC with Differential       Hepatic function panel

## 2014-04-24 ENCOUNTER — Ambulatory Visit (AMBULATORY_SURGERY_CENTER): Payer: Self-pay | Admitting: *Deleted

## 2014-04-24 VITALS — Ht 67.0 in | Wt 245.4 lb

## 2014-04-24 DIAGNOSIS — R141 Gas pain: Secondary | ICD-10-CM

## 2014-04-24 DIAGNOSIS — R143 Flatulence: Principal | ICD-10-CM

## 2014-04-24 DIAGNOSIS — R142 Eructation: Principal | ICD-10-CM

## 2014-04-24 NOTE — Progress Notes (Signed)
No allergies to eggs or soy. No problems with anesthesia.  Pt given Emmi instructions for colonoscopy  No oxygen use  No diet drug use  

## 2014-05-01 DIAGNOSIS — M5137 Other intervertebral disc degeneration, lumbosacral region: Secondary | ICD-10-CM | POA: Diagnosis not present

## 2014-05-02 ENCOUNTER — Encounter: Payer: Self-pay | Admitting: Internal Medicine

## 2014-05-08 ENCOUNTER — Ambulatory Visit (AMBULATORY_SURGERY_CENTER): Payer: Medicare Other | Admitting: Internal Medicine

## 2014-05-08 ENCOUNTER — Encounter: Payer: Self-pay | Admitting: Internal Medicine

## 2014-05-08 VITALS — BP 114/77 | HR 47 | Temp 96.4°F | Resp 27 | Ht 67.0 in | Wt 245.0 lb

## 2014-05-08 DIAGNOSIS — Z1211 Encounter for screening for malignant neoplasm of colon: Secondary | ICD-10-CM

## 2014-05-08 DIAGNOSIS — R143 Flatulence: Principal | ICD-10-CM

## 2014-05-08 DIAGNOSIS — R141 Gas pain: Secondary | ICD-10-CM | POA: Diagnosis not present

## 2014-05-08 DIAGNOSIS — F341 Dysthymic disorder: Secondary | ICD-10-CM | POA: Diagnosis not present

## 2014-05-08 DIAGNOSIS — I10 Essential (primary) hypertension: Secondary | ICD-10-CM | POA: Diagnosis not present

## 2014-05-08 DIAGNOSIS — R142 Eructation: Principal | ICD-10-CM

## 2014-05-08 DIAGNOSIS — G4733 Obstructive sleep apnea (adult) (pediatric): Secondary | ICD-10-CM | POA: Diagnosis not present

## 2014-05-08 MED ORDER — HYDROCORTISONE 2.5 % RE CREA: TOPICAL_CREAM | RECTAL | Status: DC | PRN

## 2014-05-08 MED ORDER — LINACLOTIDE 145 MCG PO CAPS: 145.0000 ug | ORAL_CAPSULE | Freq: Every day | ORAL | Status: DC

## 2014-05-08 MED ORDER — SODIUM CHLORIDE 0.9 % IV SOLN: 500.0000 mL | INTRAVENOUS | Status: DC

## 2014-05-08 MED ORDER — POLYETHYLENE GLYCOL 3350 17 GM/SCOOP PO POWD: 17.0000 g | Freq: Every day | ORAL | Status: DC

## 2014-05-08 NOTE — Patient Instructions (Signed)
Discharge instructions given with verbal understanding. Normal exam. Resume previous medications. YOU HAD AN ENDOSCOPIC PROCEDURE TODAY AT THE [facility]: Refer to the procedure report that was given to you for any specific questions about what was found during the examination.  If the procedure report does not answer your questions, please call your gastroenterologist to clarify.  If you requested that your care partner not be given the details of your procedure findings, then the procedure report has been included in a sealed envelope for you to review at your convenience later.  YOU SHOULD EXPECT: Some feelings of bloating in the abdomen. Passage of more gas than usual.  Walking can help get rid of the air that was put into your GI tract during the procedure and reduce the bloating. If you had a lower endoscopy (such as a colonoscopy or flexible sigmoidoscopy) you may notice spotting of blood in your stool or on the toilet paper. If you underwent a bowel prep for your procedure, then you may not have a normal bowel movement for a few days.  DIET: Your first meal following the procedure should be a light meal and then it is ok to progress to your normal diet.  A half-sandwich or bowl of soup is an example of a good first meal.  Heavy or fried foods are harder to digest and may make you feel nauseous or bloated.  Likewise meals heavy in dairy and vegetables can cause extra gas to form and this can also increase the bloating.  Drink plenty of fluids but you should avoid alcoholic beverages for 24 hours.  ACTIVITY: Your care partner should take you home directly after the procedure.  You should plan to take it easy, moving slowly for the rest of the day.  You can resume normal activity the day after the procedure however you should NOT DRIVE or use heavy machinery for 24 hours (because of the sedation medicines used during the test).    SYMPTOMS TO REPORT IMMEDIATELY: A gastroenterologist  can be reached at any hour.  During normal business hours, 8:30 AM to 5:00 PM Monday through Friday, call [phone].  After hours and on weekends, please call the GI answering service at [phone] who will take a message and have the physician on call contact you.   Following lower endoscopy (colonoscopy or flexible sigmoidoscopy):  Excessive amounts of blood in the stool  Significant tenderness or worsening of abdominal pains  Swelling of the abdomen that is new, acute  Fever of 100F or higher  FOLLOW UP: If any biopsies were taken you will be contacted by phone or by letter within the next 1-3 weeks.  Call your gastroenterologist if you have not heard about the biopsies in 3 weeks.  Our staff will call the home number listed on your records the next business day following your procedure to check on you and address any questions or concerns that you may have at that time regarding the information given to you following your procedure. This is a courtesy call and so if there is no answer at the home number and we have not heard from you through the emergency physician on call, we will assume that you have returned to your regular daily activities without incident.  SIGNATURES/CONFIDENTIALITY: You and/or your care partner have signed paperwork which will be entered into your electronic medical record.  These signatures attest to the fact that that the information above on your After Visit Summary has been reviewed   and is understood.  Full responsibility of the confidentiality of this discharge information lies with you and/or your care-partner. 

## 2014-05-08 NOTE — Op Note (Signed)
[place]  [address]. [place], [address]   COLONOSCOPY PROCEDURE REPORT  PATIENT: Erica [name], Erica [name]  MR#: [id number] BIRTHDATE: 11-29-1955 , 58  yrs. old GENDER: Female ENDOSCOPIST: Lafayette Dragon, MD REFERRED DP:OEUMPNT Asa Lente, M.D. PROCEDURE DATE:  05/08/2014 PROCEDURE:   Colonoscopy, screening First Screening Colonoscopy - Avg.  risk and is 50 yrs.  old or older - No.  Prior Negative Screening - Now for repeat screening. 10 or more years since last screening  History of Adenoma - Now for follow-up colonoscopy & has been > or = to 3 yrs.  N/A  Polyps Removed Today? No.  Recommend repeat exam, <10 yrs? No. ASA CLASS:   Class II INDICATIONS:last colonoscopy July 2005 was normal area the positive family history of colon cancer in a cousin.  Bloating  and constipation. MEDICATIONS: MAC sedation, administered by CRNA and Propofol (Diprivan) 220 mg IV  DESCRIPTION OF PROCEDURE:   After the risks benefits and alternatives of the procedure were thoroughly explained, informed consent was obtained.  A digital rectal exam revealed no abnormalities of the rectum.   The LB [id number]  endoscope was introduced through the anus and advanced to the cecum, which was identified by both the appendix and ileocecal valve. No adverse events experienced.   The quality of the prep was excellent, using MoviPrep  The instrument was then slowly withdrawn as the colon was fully examined.      COLON FINDINGS: A normal appearing cecum, ileocecal valve, and appendiceal orifice were identified.  The ascending, hepatic flexure, transverse, splenic flexure, descending, sigmoid colon and rectum appeared unremarkable.  No polyps or cancers were seen. Retroflexed views revealed no abnormalities. The time to cecum=6 minutes 29 seconds.  Withdrawal time=6 minutes 32 seconds.  The scope was withdrawn and the procedure completed. COMPLICATIONS: There were no  complications.  ENDOSCOPIC IMPRESSION: Normal colon  RECOMMENDATIONS: high fiber diet Recall colonoscopy in 10 years continue Linzess 145 ug qd continue Miralax  17gm daily   eSigned:  Lafayette Dragon, MD 05/08/2014 10:37 AM   cc:   PATIENT NAME:  Erica [name], Erica [name] MR#: [id number]

## 2014-05-08 NOTE — Progress Notes (Signed)
Report to PACU, RN, vss, BBS= Clear.  

## 2014-05-09 ENCOUNTER — Telehealth: Payer: Self-pay

## 2014-05-09 NOTE — Telephone Encounter (Signed)
  Follow up Call-  Call back number 05/08/2014  Post procedure Call Back phone  # [phone]  Permission to leave phone message Yes     Patient questions:  Do you have a fever, pain , or abdominal swelling? No. Pain Score  0 *  Have you tolerated food without any problems? Yes.    Have you been able to return to your normal activities? Yes.    Do you have any questions about your discharge instructions: Diet   No. Medications  No. Follow up visit  No.  Do you have questions or concerns about your Care? No.  Actions: * If pain score is 4 or above: No action needed, pain <4.

## 2014-05-14 DIAGNOSIS — R51 Headache: Secondary | ICD-10-CM | POA: Diagnosis not present

## 2014-05-14 DIAGNOSIS — R42 Dizziness and giddiness: Secondary | ICD-10-CM | POA: Diagnosis not present

## 2014-05-14 DIAGNOSIS — J343 Hypertrophy of nasal turbinates: Secondary | ICD-10-CM | POA: Diagnosis not present

## 2014-05-14 DIAGNOSIS — J342 Deviated nasal septum: Secondary | ICD-10-CM | POA: Diagnosis not present

## 2014-05-14 DIAGNOSIS — L299 Pruritus, unspecified: Secondary | ICD-10-CM | POA: Diagnosis not present

## 2014-05-21 DIAGNOSIS — F410 Panic disorder [episodic paroxysmal anxiety] without agoraphobia: Secondary | ICD-10-CM | POA: Diagnosis not present

## 2014-05-21 DIAGNOSIS — F331 Major depressive disorder, recurrent, moderate: Secondary | ICD-10-CM | POA: Diagnosis not present

## 2014-05-21 DIAGNOSIS — F411 Generalized anxiety disorder: Secondary | ICD-10-CM | POA: Diagnosis not present

## 2014-05-31 ENCOUNTER — Telehealth: Payer: Self-pay | Admitting: Internal Medicine

## 2014-05-31 DIAGNOSIS — R141 Gas pain: Secondary | ICD-10-CM

## 2014-05-31 DIAGNOSIS — R143 Flatulence: Principal | ICD-10-CM

## 2014-05-31 DIAGNOSIS — R142 Eructation: Principal | ICD-10-CM

## 2014-05-31 MED ORDER — LINACLOTIDE 145 MCG PO CAPS: 145.0000 ug | ORAL_CAPSULE | Freq: Every day | ORAL | Status: DC

## 2014-05-31 NOTE — Telephone Encounter (Signed)
I have advised patient that we no longer provide samples. However, I have advised that I will send a prescription to [facility] for her. She verbalizes understanding.

## 2014-06-13 DIAGNOSIS — M5137 Other intervertebral disc degeneration, lumbosacral region: Secondary | ICD-10-CM | POA: Diagnosis not present

## 2014-07-02 DIAGNOSIS — M5137 Other intervertebral disc degeneration, lumbosacral region: Secondary | ICD-10-CM | POA: Diagnosis not present

## 2014-07-02 DIAGNOSIS — M545 Low back pain, unspecified: Secondary | ICD-10-CM | POA: Diagnosis not present

## 2014-07-09 ENCOUNTER — Other Ambulatory Visit: Payer: Self-pay | Admitting: Internal Medicine

## 2014-07-10 ENCOUNTER — Encounter: Payer: Self-pay | Admitting: Family

## 2014-07-10 ENCOUNTER — Telehealth: Payer: Self-pay | Admitting: Internal Medicine

## 2014-07-10 ENCOUNTER — Ambulatory Visit (INDEPENDENT_AMBULATORY_CARE_PROVIDER_SITE_OTHER): Payer: Medicare Other | Admitting: Family

## 2014-07-10 VITALS — BP 114/74 | HR 81 | Temp 98.7°F | Wt 250.4 lb

## 2014-07-10 DIAGNOSIS — I10 Essential (primary) hypertension: Secondary | ICD-10-CM

## 2014-07-10 DIAGNOSIS — R059 Cough, unspecified: Secondary | ICD-10-CM | POA: Diagnosis not present

## 2014-07-10 DIAGNOSIS — R05 Cough: Secondary | ICD-10-CM

## 2014-07-10 DIAGNOSIS — J309 Allergic rhinitis, unspecified: Secondary | ICD-10-CM | POA: Diagnosis not present

## 2014-07-10 MED ORDER — PREDNISONE 20 MG PO TABS: 20.0000 mg | ORAL_TABLET | Freq: Every day | ORAL | Status: AC

## 2014-07-10 MED ORDER — METHYLPREDNISOLONE ACETATE 40 MG/ML IJ SUSP
80.0000 mg | Freq: Once | INTRAMUSCULAR | Status: AC
  Administered 2014-07-10: 80 mg via INTRAMUSCULAR

## 2014-07-10 NOTE — Telephone Encounter (Signed)
[facility] called pt was there to pick up a rx for her cough. Pharmacy called to say that they do not have a rx  For cough medicine

## 2014-07-10 NOTE — Patient Instructions (Signed)
Hay Fever Hay fever is an allergic reaction to particles in the air. It cannot be passed from person to person. It cannot be cured, but it can be controlled. CAUSES  Hay fever is caused by something that triggers an allergic reaction (allergens). The following are examples of allergens:  Ragweed.  Feathers.  Animal dander.  Grass and tree pollens.  Cigarette smoke.  House dust.  Pollution. SYMPTOMS   Sneezing.  Runny or stuffy nose.  Tearing eyes.  Itchy eyes, nose, mouth, throat, skin, or other area.  Sore throat.  Headache.  Decreased sense of smell or taste. DIAGNOSIS Your caregiver will perform a physical exam and ask questions about the symptoms you are having.Allergy testing may be done to determine exactly what triggers your hay fever.  TREATMENT   Over-the-counter medicines may help symptoms. These include:  Antihistamines.  Decongestants. These may help with nasal congestion.  Your caregiver may prescribe medicines if over-the-counter medicines do not work.  Some people benefit from allergy shots when other medicines are not helpful. HOME CARE INSTRUCTIONS   Avoid the allergen that is causing your symptoms, if possible.  Take all medicine as told by your caregiver. SEEK MEDICAL CARE IF:   You have severe allergy symptoms and your current medicines are not helping.  Your treatment was working at one time, but you are now experiencing symptoms.  You have sinus congestion and pressure.  You develop a fever or headache.  You have thick nasal discharge.  You have asthma and have a worsening cough and wheezing. SEEK IMMEDIATE MEDICAL CARE IF:   You have swelling of your tongue or lips.  You have trouble breathing.  You feel lightheaded or like you are going to faint.  You have cold sweats.  You have a fever. Document Released: 09/27/2005 Document Revised: 12/20/2011 Document Reviewed: 12/23/2010 Exit[employer] Patient Information 2015  [employer]. This information is not intended to replace advice given to you by your health care provider. Make sure you discuss any questions you have with your health care provider.  

## 2014-07-10 NOTE — Telephone Encounter (Signed)
Advised pharmacy that a cough medication was not prescribed. Attempted to contact pt to no avail

## 2014-07-10 NOTE — Progress Notes (Signed)
Pre visit review using our clinic review tool, if applicable. No additional management support is needed unless otherwise documented below in the visit note. 

## 2014-07-10 NOTE — Progress Notes (Signed)
Subjective:    Patient ID: Erica [name], female    DOB: 1956/04/02, 58 y.o.   MRN: [id number]  HPI  58 year old African American female, nonsmoker, patient of Dr. Asa Lente, with a history of hypertension, GERD, and chronic constipation presents today with complaints of cough, laryngitis, sinus drainage x4 days. Was recently switched from Zyrtec to Allegra once daily. Uses Flonase 3 times per day as directed by ENT. Has a known allergy to mold but denies any exposure.  Review of Systems  Constitutional: Positive for fatigue.  HENT: Positive for congestion, sneezing and voice change. Negative for ear discharge and sinus pressure.   Eyes: Negative.   Cardiovascular: Negative.   Genitourinary: Negative.   Musculoskeletal: Negative.   Skin: Negative.   Allergic/Immunologic: Negative.   Neurological: Negative.   Psychiatric/Behavioral: Negative.      Past Medical History  Diagnosis Date  . Hypertension   . ALLERGIC RHINITIS   . Sleep apnea     CPAP hs  . PAF (paroxysmal atrial fibrillation)     pt denies  . Dyslipidemia   . Panic attacks     Hx of depression  . Osteoarthritis   . Obesity   . Depression   . External hemorrhoids   . GERD (gastroesophageal reflux disease)   . Allergy   . Chronic kidney disease     History   Social History  . Marital Status: Married    Spouse Name: N/A    Number of Children: 3  . Years of Education: N/A   Occupational History  . disabled    Social History Main Topics  . Smoking status: Never Smoker   . Smokeless tobacco: Never Used     Comment: Married with children. 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 Alcohol Use: 0.6 oz/week    1 Glasses of wine per week     Comment: one drink a month  . Drug Use: No  . Sexual Activity: Not Currently   Other Topics Concern  . Not on file   Social History Narrative   Married with children. Pt is on disablity. Previous worked in the school system    Past  Surgical History  Procedure Laterality Date  . Replacement total knee Right   . Abdominal hysterectomy    . Right oophorectomy      No cancer    Family History  Problem Relation Age of Onset  . Heart disease Mother   . Dementia Father   . Allergies Sister   . Asthma Son   . Breast cancer Maternal Aunt     cousin  . Asthma Other   . Colon cancer Cousin   . Heart disease Son     CHF  . Diabetes Mother   . Thyroid disease Mother   . Prostate cancer Maternal Uncle   . Kidney failure Father   . Prostate cancer Father     Allergies  Allergen Reactions  . Codeine Nausea And Vomiting  . Guaifenesin-Codeine     REACTION: feels spacey  . Wellbutrin [Bupropion] Palpitations    hallucinations    Current Outpatient Prescriptions on File Prior to Visit  Medication Sig Dispense Refill  . fexofenadine (ALLEGRA) 180 MG tablet Take 1 tablet (180 mg total) by mouth daily.  90 tablet  3  . fluticasone (FLONASE) 50 MCG/ACT nasal spray Place 1 spray into the nose as needed.      . furosemide (LASIX) 40 MG tablet TAKE 1  TABLET (40 MG TOTAL) BY MOUTH 2 (TWO) TIMES DAILY.  180 tablet  3  . hydrocortisone (ANUSOL-HC) 2.5 % rectal cream Place rectally as needed.  30 g  3  . lactulose (CHRONULAC) 10 GM/15ML solution Take 15 mLs (10 g total) by mouth daily.  240 mL  1  . Linaclotide (LINZESS) 145 MCG CAPS capsule Take 1 capsule (145 mcg total) by mouth daily.  30 capsule  3  . Linaclotide (LINZESS) 145 MCG CAPS capsule Take 1 capsule (145 mcg total) by mouth daily.  90 capsule  3  . meclizine (ANTIVERT) 25 MG tablet Take 1 tablet (25 mg total) by mouth 3 (three) times daily as needed for dizziness or nausea.  30 tablet  0  . meloxicam (MOBIC) 15 MG tablet Take 15 mg by mouth as needed.      Marland Kitchen omeprazole (PRILOSEC) 20 MG capsule Take 1 capsule (20 mg total) by mouth daily.  30 capsule  0  . oxycodone (OXY-IR) 5 MG capsule Take 5 mg by mouth every 4 (four) hours as needed. For pain.      .  polyethylene glycol powder (GLYCOLAX/MIRALAX) powder Take 17 g by mouth daily.  3350 g  1  . potassium chloride SA (K-DUR,KLOR-CON) 20 MEQ tablet TAKE 1 TABLET BY MOUTH TWICE DAILY  180 tablet  3  . simvastatin (ZOCOR) 40 MG tablet TAKE ONE TABLET BY MOUTH DAILY  90 tablet  3  . tizanidine (ZANAFLEX) 2 MG capsule Take 2 mg by mouth 3 (three) times daily as needed for muscle spasms.      . ALPRAZolam (XANAX) 1 MG tablet Take 0.5-1 tablets (0.5-1 mg total) by mouth at bedtime as needed for sleep or anxiety.  180 tablet  0  . atenolol (TENORMIN) 50 MG tablet Take 1 tablet (50 mg total) by mouth daily.  90 tablet  3  . PARoxetine (PAXIL-CR) 25 MG 24 hr tablet Take 25 mg by mouth daily.       No current facility-administered medications on file prior to visit.    BP 114/74  Pulse 81  Temp(Src) 98.7 F (37.1 C) (Oral)  Wt 250 lb 6.4 oz (113.581 kg)chart    Objective:   Physical Exam  Constitutional: She is oriented to person, place, and time. She appears well-developed and well-nourished.  HENT:  Right Ear: External ear normal.  Left Ear: External ear normal.  Mouth/Throat: Oropharynx is clear and moist.  Nasal turbinates red and swollen  Neck: Normal range of motion. Neck supple.  Cardiovascular: Normal rate, regular rhythm and normal heart sounds.   Pulmonary/Chest: Effort normal. She has no wheezes.  Abdominal: Soft. Bowel sounds are normal.  Musculoskeletal: Normal range of motion.  Neurological: She is [name] and oriented to person, place, and time.  Skin: Skin is warm and dry.  Psychiatric: She has a normal mood and affect.          Assessment & Plan:  Erica [name] was seen today for cough.  Diagnoses and associated orders for this visit:  Allergic rhinitis, unspecified allergic rhinitis type - methylPREDNISolone acetate (DEPO-MEDROL) injection 80 mg; Inject 2 mLs (80 mg total) into the muscle once.  Cough  Unspecified essential hypertension  Other Orders - predniSONE  (DELTASONE) 20 MG tablet; Take 1 tablet (20 mg total) by mouth daily.   Call the office with any questions or concerns. Recheck as scheduled and as needed. Will call back with the name of the cough syrup that she has taken and  provide RX.

## 2014-07-11 ENCOUNTER — Telehealth: Payer: Self-pay | Admitting: Internal Medicine

## 2014-07-11 NOTE — Telephone Encounter (Signed)
emmi emailed °

## 2014-07-16 DIAGNOSIS — F410 Panic disorder [episodic paroxysmal anxiety] without agoraphobia: Secondary | ICD-10-CM | POA: Diagnosis not present

## 2014-07-16 DIAGNOSIS — F411 Generalized anxiety disorder: Secondary | ICD-10-CM | POA: Diagnosis not present

## 2014-07-16 DIAGNOSIS — F331 Major depressive disorder, recurrent, moderate: Secondary | ICD-10-CM | POA: Diagnosis not present

## 2014-08-06 ENCOUNTER — Other Ambulatory Visit: Payer: Self-pay | Admitting: Internal Medicine

## 2014-08-19 ENCOUNTER — Other Ambulatory Visit: Payer: Self-pay

## 2014-08-19 MED ORDER — ATENOLOL 50 MG PO TABS: 50.0000 mg | ORAL_TABLET | Freq: Every day | ORAL | Status: DC

## 2014-08-27 ENCOUNTER — Other Ambulatory Visit: Payer: Self-pay

## 2014-08-27 DIAGNOSIS — Z1231 Encounter for screening mammogram for malignant neoplasm of breast: Secondary | ICD-10-CM

## 2014-09-09 ENCOUNTER — Encounter: Payer: Self-pay | Admitting: Internal Medicine

## 2014-09-09 ENCOUNTER — Ambulatory Visit (INDEPENDENT_AMBULATORY_CARE_PROVIDER_SITE_OTHER): Payer: Medicare Other | Admitting: Internal Medicine

## 2014-09-09 VITALS — BP 124/78 | HR 60 | Temp 98.1°F | Ht 67.0 in | Wt 253.5 lb

## 2014-09-09 DIAGNOSIS — Z0000 Encounter for general adult medical examination without abnormal findings: Secondary | ICD-10-CM

## 2014-09-09 DIAGNOSIS — Z23 Encounter for immunization: Secondary | ICD-10-CM

## 2014-09-09 MED ORDER — SIMVASTATIN 40 MG PO TABS: 40.0000 mg | ORAL_TABLET | Freq: Every day | ORAL | Status: DC

## 2014-09-09 MED ORDER — MECLIZINE HCL 25 MG PO TABS: 25.0000 mg | ORAL_TABLET | Freq: Three times a day (TID) | ORAL | Status: DC | PRN

## 2014-09-09 MED ORDER — POTASSIUM CHLORIDE CRYS ER 20 MEQ PO TBCR: 20.0000 meq | EXTENDED_RELEASE_TABLET | Freq: Two times a day (BID) | ORAL | Status: DC

## 2014-09-09 MED ORDER — OMEPRAZOLE 20 MG PO CPDR: 20.0000 mg | DELAYED_RELEASE_CAPSULE | Freq: Every day | ORAL | Status: DC

## 2014-09-09 MED ORDER — FUROSEMIDE 40 MG PO TABS: 40.0000 mg | ORAL_TABLET | Freq: Two times a day (BID) | ORAL | Status: DC

## 2014-09-09 MED ORDER — ATENOLOL 50 MG PO TABS: 50.0000 mg | ORAL_TABLET | Freq: Every day | ORAL | Status: DC

## 2014-09-09 NOTE — Progress Notes (Signed)
Pre visit review using our clinic review tool, if applicable. No additional management support is needed unless otherwise documented below in the visit note. 

## 2014-09-09 NOTE — Patient Instructions (Addendum)
It was good to see you today.  We have reviewed your prior records including labs and tests today  Health Maintenance reviewed - annual flu shot and 10 yr Tdap (tetanus diphtheria and pertussis) administered today. All other recommended immunizations and age-appropriate screenings are up-to-date.  Medications reviewed and updated, no changes recommended at this time. Refill on medication(s) as discussed today.  Please schedule followup in 6 months for semiannual exam and labs, call sooner if problems.  Health Maintenance Adopting a healthy lifestyle and getting preventive care can go a long way to promote health and wellness. Talk with your health care provider about what schedule of regular examinations is right for you. This is a good chance for you to check in with your provider about disease prevention and staying healthy. In between checkups, there are plenty of things you can do on your own. Experts have done a lot of research about which lifestyle changes and preventive measures are most likely to keep you healthy. Ask your health care provider for more information. WEIGHT AND DIET  Eat a healthy diet  Be sure to include plenty of vegetables, fruits, low-fat dairy products, and lean protein.  Do not eat a lot of foods high in solid fats, added sugars, or salt.  Get regular exercise. This is one of the most important things you can do for your health.  Most adults should exercise for at least 150 minutes each week. The exercise should increase your heart rate and make you sweat (moderate-intensity exercise).  Most adults should also do strengthening exercises at least twice a week. This is in addition to the moderate-intensity exercise.  Maintain a healthy weight  Body mass index (BMI) is a measurement that can be used to identify possible weight problems. It estimates body fat based on height and weight. Your health care provider can help determine your BMI and help you achieve or  maintain a healthy weight.  For females 20 years of age and older:   A BMI below 18.5 is considered underweight.  A BMI of 18.5 to 24.9 is normal.  A BMI of 25 to 29.9 is considered overweight.  A BMI of 30 and above is considered obese.  Watch levels of cholesterol and blood lipids  You should start having your blood tested for lipids and cholesterol at 60 years of age, then have this test every 5 years.  You may need to have your cholesterol levels checked more often if:  Your lipid or cholesterol levels are high.  You are older than 60 years of age.  You are at high risk for heart disease.  CANCER SCREENING   Lung Cancer  Lung cancer screening is recommended for adults 55-80 years old who are at high risk for lung cancer because of a history of smoking.  A yearly low-dose CT scan of the lungs is recommended for people who:  Currently smoke.  Have quit within the past 15 years.  Have at least a 30-pack-year history of smoking. A pack year is smoking an average of one pack of cigarettes a day for 1 year.  Yearly screening should continue until it has been 15 years since you quit.  Yearly screening should stop if you develop a health problem that would prevent you from having lung cancer treatment.  Breast Cancer  Practice breast self-awareness. This means understanding how your breasts normally appear and feel.  It also means doing regular breast self-exams. Let your health care provider know about any changes,   no matter how small.  If you are in your 20s or 30s, you should have a clinical breast exam (CBE) by a health care provider every 1-3 years as part of a regular health exam.  If you are 40 or older, have a CBE every year. Also consider having a breast X-ray (mammogram) every year.  If you have a family history of breast cancer, talk to your health care provider about genetic screening.  If you are at high risk for breast cancer, talk to your health care  provider about having an MRI and a mammogram every year.  Breast cancer gene (BRCA) assessment is recommended for women who have family members with BRCA-related cancers. BRCA-related cancers include:  Breast.  Ovarian.  Tubal.  Peritoneal cancers.  Results of the assessment will determine the need for genetic counseling and BRCA1 and BRCA2 testing. Cervical Cancer Routine pelvic examinations to screen for cervical cancer are no longer recommended for nonpregnant women who are considered low risk for cancer of the pelvic organs (ovaries, uterus, and vagina) and who do not have symptoms. A pelvic examination may be necessary if you have symptoms including those associated with pelvic infections. Ask your health care provider if a screening pelvic exam is right for you.   The Pap test is the screening test for cervical cancer for women who are considered at risk.  If you had a hysterectomy for a problem that was not cancer or a condition that could lead to cancer, then you no longer need Pap tests.  If you are older than 65 years, and you have had normal Pap tests for the past 10 years, you no longer need to have Pap tests.  If you have had past treatment for cervical cancer or a condition that could lead to cancer, you need Pap tests and screening for cancer for at least 20 years after your treatment.  If you no longer get a Pap test, assess your risk factors if they change (such as having a new sexual partner). This can affect whether you should start being screened again.  Some women have medical problems that increase their chance of getting cervical cancer. If this is the case for you, your health care provider may recommend more frequent screening and Pap tests.  The human papillomavirus (HPV) test is another test that may be used for cervical cancer screening. The HPV test looks for the virus that can cause cell changes in the cervix. The cells collected during the Pap test can be  tested for HPV.  The HPV test can be used to screen women 30 years of age and older. Getting tested for HPV can extend the interval between normal Pap tests from three to five years.  An HPV test also should be used to screen women of any age who have unclear Pap test results.  After 60 years of age, women should have HPV testing as often as Pap tests.  Colorectal Cancer  This type of cancer can be detected and often prevented.  Routine colorectal cancer screening usually begins at 60 years of age and continues through 60 years of age.  Your health care provider may recommend screening at an earlier age if you have risk factors for colon cancer.  Your health care provider may also recommend using home test kits to check for hidden blood in the stool.  A small camera at the end of a tube can be used to examine your colon directly (sigmoidoscopy or colonoscopy). This   is done to check for the earliest forms of colorectal cancer.  Routine screening usually begins at age 17.  Direct examination of the colon should be repeated every 5-10 years through 60 years of age. However, you may need to be screened more often if early forms of precancerous polyps or small growths are found. Skin Cancer  Check your skin from head to toe regularly.  Tell your health care provider about any new moles or changes in moles, especially if there is a change in a mole's shape or color.  Also tell your health care provider if you have a mole that is larger than the size of a pencil eraser.  Always use sunscreen. Apply sunscreen liberally and repeatedly throughout the day.  Protect yourself by wearing long sleeves, pants, a wide-brimmed hat, and sunglasses whenever you are outside. HEART DISEASE, DIABETES, AND HIGH BLOOD PRESSURE   Have your blood pressure checked at least every 1-2 years. High blood pressure causes heart disease and increases the risk of stroke.  If you are between 54 years and 54 years  old, ask your health care provider if you should take aspirin to prevent strokes.  Have regular diabetes screenings. This involves taking a blood sample to check your fasting blood sugar level.  If you are at a normal weight and have a low risk for diabetes, have this test once every three years after 60 years of age.  If you are overweight and have a high risk for diabetes, consider being tested at a younger age or more often. PREVENTING INFECTION  Hepatitis B  If you have a higher risk for hepatitis B, you should be screened for this virus. You are considered at high risk for hepatitis B if:  You were born in a country where hepatitis B is common. Ask your health care provider which countries are considered high risk.  Your parents were born in a high-risk country, and you have not been immunized against hepatitis B (hepatitis B vaccine).  You have HIV or AIDS.  You use needles to inject street drugs.  You live with someone who has hepatitis B.  You have had sex with someone who has hepatitis B.  You get hemodialysis treatment.  You take certain medicines for conditions, including cancer, organ transplantation, and autoimmune conditions. Hepatitis C  Blood testing is recommended for:  Everyone born from [birth year] through 1967.  Anyone with known risk factors for hepatitis C. Sexually transmitted infections (STIs)  You should be screened for sexually transmitted infections (STIs) including gonorrhea and chlamydia if:  You are sexually active and are younger than 60 years of age.  You are older than 60 years of age and your health care provider tells you that you are at risk for this type of infection.  Your sexual activity has changed since you were last screened and you are at an increased risk for chlamydia or gonorrhea. Ask your health care provider if you are at risk.  If you do not have HIV, but are at risk, it may be recommended that you take a prescription medicine  daily to prevent HIV infection. This is called pre-exposure prophylaxis (PrEP). You are considered at risk if:  You are sexually active and do not regularly use condoms or know the HIV status of your partner(s).  You take drugs by injection.  You are sexually active with a partner who has HIV. Talk with your health care provider about whether you are at high risk of being  infected with HIV. If you choose to begin PrEP, you should first be tested for HIV. You should then be tested every 3 months for as long as you are taking PrEP.  PREGNANCY   If you are premenopausal and you may become pregnant, ask your health care provider about preconception counseling.  If you may become pregnant, take 400 to 800 micrograms (mcg) of folic acid every day.  If you want to prevent pregnancy, talk to your health care provider about birth control (contraception). OSTEOPOROSIS AND MENOPAUSE   Osteoporosis is a disease in which the bones lose minerals and strength with aging. This can result in serious bone fractures. Your risk for osteoporosis can be identified using a bone density scan.  If you are 37 years of age or older, or if you are at risk for osteoporosis and fractures, ask your health care provider if you should be screened.  Ask your health care provider whether you should take a calcium or vitamin D supplement to lower your risk for osteoporosis.  Menopause may have certain physical symptoms and risks.  Hormone replacement therapy may reduce some of these symptoms and risks. Talk to your health care provider about whether hormone replacement therapy is right for you.  HOME CARE INSTRUCTIONS   Schedule regular health, dental, and eye exams.  Stay current with your immunizations.   Do not use any tobacco products including cigarettes, chewing tobacco, or electronic cigarettes.  If you are pregnant, do not drink alcohol.  If you are breastfeeding, limit how much and how often you drink  alcohol.  Limit alcohol intake to no more than 1 drink per day for nonpregnant women. One drink equals 12 ounces of beer, 5 ounces of wine, or 1 ounces of hard liquor.  Do not use street drugs.  Do not share needles.  Ask your health care provider for help if you need support or information about quitting drugs.  Tell your health care provider if you often feel depressed.  Tell your health care provider if you have ever been abused or do not feel safe at home. Document Released: 04/12/2011 Document Revised: 02/11/2014 Document Reviewed: 08/29/2013 [facility] Patient Information 2015 [place], [place]. This information is not intended to replace advice given to you by your health care provider. Make sure you discuss any questions you have with your health care provider.

## 2014-09-09 NOTE — Progress Notes (Signed)
Subjective:    Patient ID: Erica [name] Alert, female    DOB: 08-25-1956, 58 y.o.   MRN: [id number]  HPI   Here for medicare wellness  Diet: heart healthy Physical activity: sedentary Depression/mood screen: negative Hearing: intact to whispered voice Visual acuity: grossly normal, performs annual eye exam  ADLs: capable Fall risk: none Home safety: good Cognitive evaluation: intact to orientation, naming, recall and repetition EOL planning: adv directives, full code/ I agree  I have personally reviewed and have noted 1. The patient's medical and social history 2. Their use of alcohol, tobacco or illicit drugs 3. Their current medications and supplements 4. The patient's functional ability including ADL's, fall risks, home safety risks and hearing or visual impairment. 5. Diet and physical activities 6. Evidence for depression or mood disorders  Also reviewed chronic medical issues and interval medical events  Past Medical History  Diagnosis Date  . Hypertension   . ALLERGIC RHINITIS   . Sleep apnea     CPAP hs  . PAF (paroxysmal atrial fibrillation)     pt denies  . Dyslipidemia   . Panic attacks     Hx of depression  . Osteoarthritis   . Obesity   . Depression   . External hemorrhoids   . GERD (gastroesophageal reflux disease)   . Allergy   . Chronic kidney disease    Family History  Problem Relation Age of Onset  . Heart disease Mother   . Dementia Father   . Allergies Sister   . Asthma Son   . Breast cancer Maternal Aunt     cousin  . Asthma Other   . Colon cancer Cousin   . Heart disease Son     CHF  . Diabetes Mother   . Thyroid disease Mother   . Prostate cancer Maternal Uncle   . Kidney failure Father   . Prostate cancer Father    History  Substance Use Topics  . Smoking status: Never Smoker   . Smokeless tobacco: Never Used  . Alcohol Use: 0.6 oz/week    1 Glasses of wine per week     Comment: one drink a month    Review of Systems   Constitutional: Positive for fatigue. Negative for unexpected weight change.  Respiratory: Negative for cough, shortness of breath and wheezing.   Cardiovascular: Negative for chest pain, palpitations and leg swelling.  Gastrointestinal: Negative for nausea, abdominal pain and diarrhea.  Neurological: Negative for dizziness, weakness, light-headedness and headaches.  Psychiatric/Behavioral: Negative for dysphoric mood. The patient is not nervous/anxious.   All other systems reviewed and are negative.      Objective:   Physical Exam  BP 124/78 mmHg  Pulse 60  Temp(Src) 98.1 F (36.7 C) (Oral)  Ht 5\' 7"  (1.702 m)  Wt 253 lb 8 oz (114.987 kg)  BMI 39.69 kg/m2  SpO2 97% Wt Readings from Last 3 Encounters:  09/09/14 253 lb 8 oz (114.987 kg)  07/10/14 250 lb 6.4 oz (113.581 kg)  05/08/14 245 lb (111.131 kg)   Constitutional: She is obese, appears well-developed and well-nourished. No distress.  Neck: Thick. Normal range of motion. Neck supple. No JVD present. No thyromegaly present.  Cardiovascular: Normal rate, regular rhythm and normal heart sounds.  No murmur heard. No BLE edema. Pulmonary/Chest: Effort normal and breath sounds normal. No respiratory distress. She has no wheezes.  Psychiatric: She has a normal mood and affect. Her behavior is normal. Judgment and thought content normal.   Lab  Results  Component Value Date   WBC 7.1 04/22/2014   HGB 11.8* 04/22/2014   HCT 36.0 04/22/2014   PLT 223.0 04/22/2014   GLUCOSE 89 04/22/2014   CHOL 165 04/22/2014   TRIG 174.0* 04/22/2014   HDL 43.10 04/22/2014   LDLCALC 87 04/22/2014   ALT 17 04/22/2014   AST 19 04/22/2014   NA 142 04/22/2014   K 4.0 04/22/2014   CL 111 04/22/2014   CREATININE 0.8 04/22/2014   BUN 15 04/22/2014   CO2 27 04/22/2014   TSH 3.27 07/13/2012   HGBA1C 6.2 01/23/2009    US Abdomen Complete  01/28/2014   CLINICAL DATA:  Bloating, abdominal pain  EXAM: ULTRASOUND ABDOMEN COMPLETE  COMPARISON:   None.  FINDINGS: Gallbladder:  No gallstones or wall thickening visualized. No sonographic Murphy sign noted.  Common bile duct:  Diameter:  3 mm  Liver:  No focal lesion identified. Within normal limits in parenchymal echogenicity.  IVC:  No abnormality visualized.  Pancreas:  Visualized portion unremarkable.  Spleen:  Size and appearance within normal limits.  Right Kidney:  Length: 10.3 cm. Echogenicity within normal limits. No mass or hydronephrosis visualized.  Left Kidney:  Length: 10.3 cm. Echogenicity within normal limits. No mass or hydronephrosis visualized.  Abdominal aorta:  No aneurysm visualized.  Other findings:  None.  IMPRESSION: Normal abdominal ultrasound.   Electronically Signed   By: [name]   On: 01/28/2014 09:39       Assessment & Plan:   AWV/z00.00 - Today patient counseled on age appropriate routine health concerns for screening and prevention, each reviewed and up to date or declined. Immunizations reviewed and up to date or declined. Labs reviewed. Risk factors for depression reviewed and negative. Hearing function and visual acuity are intact. ADLs screened and addressed as needed. Functional ability and level of safety reviewed and appropriate. Education, counseling and referrals performed based on assessed risks today. Patient provided with a copy of personalized plan for preventive services.

## 2014-09-10 ENCOUNTER — Ambulatory Visit
Admission: RE | Admit: 2014-09-10 | Discharge: 2014-09-10 | Disposition: A | Discharge status: Home / Self Care | Payer: Medicare Other | Source: Ambulatory Visit

## 2014-09-10 DIAGNOSIS — Z1231 Encounter for screening mammogram for malignant neoplasm of breast: Secondary | ICD-10-CM | POA: Diagnosis not present

## 2014-10-08 DIAGNOSIS — F410 Panic disorder [episodic paroxysmal anxiety] without agoraphobia: Secondary | ICD-10-CM | POA: Diagnosis not present

## 2014-10-08 DIAGNOSIS — F331 Major depressive disorder, recurrent, moderate: Secondary | ICD-10-CM | POA: Diagnosis not present

## 2014-10-08 DIAGNOSIS — F411 Generalized anxiety disorder: Secondary | ICD-10-CM | POA: Diagnosis not present

## 2014-11-05 DIAGNOSIS — N760 Acute vaginitis: Secondary | ICD-10-CM | POA: Diagnosis not present

## 2014-11-05 DIAGNOSIS — R1084 Generalized abdominal pain: Secondary | ICD-10-CM | POA: Diagnosis not present

## 2014-11-12 ENCOUNTER — Telehealth: Payer: Self-pay | Admitting: Cardiovascular Disease

## 2014-11-12 NOTE — Telephone Encounter (Addendum)
SPOKE WITH   PT  RE  MESSAGE  HAS NOT   LOGGED  ANY B/P  READINGS  OR  HEART  RATES   DENIES  ANY  RECENT  CAFFEINE  INTAKE OR OTC MEDS DID  NOTE  SOME  CHEST  DISCOMFORT   UPON INSPIRATION AND  HAS  COUGH   ENCOURAGE  TO  F/U  WITH PMD  PT  VERBALIZED UNDERSTANDING WILL  CALL IF   HAS INCREASE IN S/S  OR  NO IMPROVEMENT./CY

## 2014-11-12 NOTE — Telephone Encounter (Signed)
Patient c/o Palpitations:  High priority if patient c/o lightheadedness and shortness of breath.  1. How long have you been having palpitations? 2-3 weeks   2. Are you currently experiencing lightheadedness and shortness of breath?Yes, a little bit of SOB  3. Have you checked your BP and heart rate? (document readings)  Yes! No readings.. Not sure how accurate they are. Refused to disclose  4. Are you experiencing any other symptoms? No

## 2014-11-13 DIAGNOSIS — R1084 Generalized abdominal pain: Secondary | ICD-10-CM | POA: Diagnosis not present

## 2014-11-19 NOTE — Telephone Encounter (Signed)
LM TO CALL BACK ./CY 

## 2014-11-20 NOTE — Telephone Encounter (Signed)
WILL AWAIT  RETURN CALL  FROM PT  ./CY 

## 2014-12-10 DIAGNOSIS — G4733 Obstructive sleep apnea (adult) (pediatric): Secondary | ICD-10-CM | POA: Diagnosis not present

## 2014-12-10 DIAGNOSIS — J343 Hypertrophy of nasal turbinates: Secondary | ICD-10-CM | POA: Diagnosis not present

## 2014-12-10 DIAGNOSIS — J342 Deviated nasal septum: Secondary | ICD-10-CM | POA: Diagnosis not present

## 2014-12-10 DIAGNOSIS — J019 Acute sinusitis, unspecified: Secondary | ICD-10-CM | POA: Diagnosis not present

## 2014-12-11 DIAGNOSIS — H04123 Dry eye syndrome of bilateral lacrimal glands: Secondary | ICD-10-CM | POA: Diagnosis not present

## 2014-12-11 DIAGNOSIS — H10413 Chronic giant papillary conjunctivitis, bilateral: Secondary | ICD-10-CM | POA: Diagnosis not present

## 2014-12-11 DIAGNOSIS — H11153 Pinguecula, bilateral: Secondary | ICD-10-CM | POA: Diagnosis not present

## 2014-12-31 ENCOUNTER — Ambulatory Visit (INDEPENDENT_AMBULATORY_CARE_PROVIDER_SITE_OTHER): Payer: Medicare Other | Admitting: Internal Medicine

## 2014-12-31 ENCOUNTER — Encounter: Payer: Self-pay | Admitting: Internal Medicine

## 2014-12-31 ENCOUNTER — Other Ambulatory Visit: Payer: Self-pay | Admitting: Geriatric Medicine

## 2014-12-31 ENCOUNTER — Other Ambulatory Visit (INDEPENDENT_AMBULATORY_CARE_PROVIDER_SITE_OTHER): Payer: Medicare Other

## 2014-12-31 VITALS — BP 118/82 | HR 70 | Temp 98.1°F | Resp 18 | Wt 254.8 lb

## 2014-12-31 DIAGNOSIS — R141 Gas pain: Secondary | ICD-10-CM | POA: Diagnosis not present

## 2014-12-31 DIAGNOSIS — R143 Flatulence: Secondary | ICD-10-CM

## 2014-12-31 DIAGNOSIS — R142 Eructation: Secondary | ICD-10-CM | POA: Diagnosis not present

## 2014-12-31 DIAGNOSIS — R0602 Shortness of breath: Secondary | ICD-10-CM

## 2014-12-31 DIAGNOSIS — F411 Generalized anxiety disorder: Secondary | ICD-10-CM | POA: Diagnosis not present

## 2014-12-31 DIAGNOSIS — F331 Major depressive disorder, recurrent, moderate: Secondary | ICD-10-CM | POA: Diagnosis not present

## 2014-12-31 DIAGNOSIS — R002 Palpitations: Secondary | ICD-10-CM | POA: Diagnosis not present

## 2014-12-31 DIAGNOSIS — F410 Panic disorder [episodic paroxysmal anxiety] without agoraphobia: Secondary | ICD-10-CM | POA: Diagnosis not present

## 2014-12-31 LAB — COMPREHENSIVE METABOLIC PANEL
ALT: 10 U/L (ref 0–35)
AST: 15 U/L (ref 0–37)
Albumin: 3.8 g/dL (ref 3.5–5.2)
Alkaline Phosphatase: 137 U/L — ABNORMAL HIGH (ref 39–117)
BUN: 16 mg/dL (ref 6–23)
CO2: 31 mEq/L (ref 19–32)
Calcium: 9.5 mg/dL (ref 8.4–10.5)
Chloride: 106 mEq/L (ref 96–112)
Creatinine, Ser: 0.69 mg/dL (ref 0.40–1.20)
GFR: 111.2 mL/min (ref >60.00)
Glucose, Bld: 100 mg/dL — ABNORMAL HIGH (ref 70–99)
Potassium: 3.8 mEq/L (ref 3.5–5.1)
Sodium: 141 mEq/L (ref 135–145)
Total Bilirubin: 0.4 mg/dL (ref 0.2–1.2)
Total Protein: 7.5 g/dL (ref 6.0–8.3)

## 2014-12-31 LAB — BRAIN NATRIURETIC PEPTIDE: Pro B Natriuretic peptide (BNP): 27 pg/mL (ref 0.0–100.0)

## 2014-12-31 MED ORDER — TIZANIDINE HCL 2 MG PO CAPS: 2.0000 mg | ORAL_CAPSULE | Freq: Three times a day (TID) | ORAL | Status: DC | PRN

## 2014-12-31 MED ORDER — POTASSIUM CHLORIDE CRYS ER 20 MEQ PO TBCR: 20.0000 meq | EXTENDED_RELEASE_TABLET | Freq: Two times a day (BID) | ORAL | Status: DC

## 2014-12-31 MED ORDER — MECLIZINE HCL 25 MG PO TABS: 25.0000 mg | ORAL_TABLET | Freq: Three times a day (TID) | ORAL | Status: DC | PRN

## 2014-12-31 MED ORDER — HYDROCORTISONE 2.5 % RE CREA
TOPICAL_CREAM | RECTAL | Status: DC | PRN
Start: 2014-12-31 — End: 2016-03-23

## 2014-12-31 MED ORDER — SIMVASTATIN 40 MG PO TABS: 40.0000 mg | ORAL_TABLET | Freq: Every day | ORAL | Status: DC

## 2014-12-31 NOTE — Progress Notes (Signed)
   Subjective:    Patient ID: Erica [name], female    DOB: [birth year], 59 y.o.   MRN: [id number]  HPI The patient comes in with a recurrent problem of palpitations. She has been struggling with the death of her son in 26-Oct-2023. She has been having some heart racing, denies associated pain, diaphoresis. Has associated nausea. Has not been sleeping well. Is seeing a therapist which is helping some. Has been tested for heart palpitations several years ago and these feel similar to those (reviewed records, no cause thought to be sinus tachycardia). Denies any pattern to when they come (not with activity). She does also have associated leg swelling (chronic but worse lately).  Review of Systems  Constitutional: Negative for fever, chills, activity change, appetite change, fatigue and unexpected weight change.  Respiratory: Negative for cough, chest tightness, shortness of breath and wheezing.   Cardiovascular: Positive for palpitations and leg swelling. Negative for chest pain.  Gastrointestinal: Positive for nausea. Negative for vomiting, abdominal pain, diarrhea, constipation and abdominal distention.  Neurological: Negative.   Psychiatric/Behavioral: Positive for sleep disturbance, dysphoric mood and decreased concentration. Negative for suicidal ideas and self-injury.      Objective:   Physical Exam  Constitutional: She is oriented to person, place, and time. She appears well-developed and well-nourished.  HENT:  Head: Normocephalic and atraumatic.  Neck: No JVD present.  Cardiovascular: Normal rate and regular rhythm.   No murmur heard. Pulmonary/Chest: Effort normal and breath sounds normal. No respiratory distress. She has no wheezes. She has no rales.  Musculoskeletal: She exhibits edema.  1+ pitting edema bilateral legs  Neurological: She is [name] and oriented to person, place, and time. Coordination normal.   Filed Vitals:   12/31/14 1107  BP: 118/82  Pulse: 70  Temp: 98.1  F (36.7 C)  TempSrc: Oral  Resp: 18  Weight: 254 lb 12.8 oz (115.577 kg)  SpO2: 99%   EKG interpretation: due to equipment failure unable to get EKG done in office.     Assessment & Plan:

## 2014-12-31 NOTE — Assessment & Plan Note (Addendum)
Appears to be recurrent. EKG ordered but due to mechanical failure unable to obtain today. Will have her return for EKG. Most likely related to recent emotional stress. She will continue with her therapist and if still having the palpitations or they worsen will refer back to cardiology for event monitor again. Also checking pro BNP for the swelling in the legs although it appears benign on exam. Will try conservative management with elevation to start.

## 2014-12-31 NOTE — Progress Notes (Signed)
Pre visit review using our clinic review tool, if applicable. No additional management support is needed unless otherwise documented below in the visit note. 

## 2014-12-31 NOTE — Patient Instructions (Addendum)
We have sent in all the refills except the oxycodone since we do not know the dose of that per month. Everything else that you needed has been sent in. If you need any different medicines please feel free to call the office.   The EKG of the heart we will do when you come back. We are sorry that we could not get the machine to work. Most likely  the palpitations are likely coming from emotional stress and exhaustion. Having your counselor will be great to have someone to talk to. If you are having more problems or have more questions please call us back.   We will also check a blood test today to make sure that the heart is not having problems. The extra fluid in your legs could be coming from changes in your diet or the food you are eating. It could also be coming from sitting more. The best thing to do to help it out is to keep the feet propped up above the level of the heart. This helps the fluid go out of the legs and back to the heart.   Stress and Stress Management Stress is a normal reaction to life events. It is what you feel when life demands more than you are used to or more than you can handle. Some stress can be useful. For example, the stress reaction can help you catch the last bus of the day, study for a test, or meet a deadline at work. But stress that occurs too often or for too long can cause problems. It can affect your emotional health and interfere with relationships and normal daily activities. Too much stress can weaken your immune system and increase your risk for physical illness. If you already have a medical problem, stress can make it worse. CAUSES  All sorts of life events may cause stress. An event that causes stress for one person may not be stressful for another person. Major life events commonly cause stress. These may be positive or negative. Examples include losing your job, moving into a new home, getting married, having a baby, or losing a loved one. Less obvious life  events may also cause stress, especially if they occur day after day or in combination. Examples include working long hours, driving in traffic, caring for children, being in debt, or being in a difficult relationship. SIGNS AND SYMPTOMS Stress may cause emotional symptoms including, the following:  Anxiety. This is feeling worried, afraid, on edge, overwhelmed, or out of control.  Anger. This is feeling irritated or impatient.  Depression. This is feeling sad, down, helpless, or guilty.  Difficulty focusing, remembering, or making decisions. Stress may cause physical symptoms, including the following:   Aches and pains. These may affect your head, neck, back, stomach, or other areas of your body.  Tight muscles or clenched jaw.  Low energy or trouble sleeping. Stress may cause unhealthy behaviors, including the following:   Eating to feel better (overeating) or skipping meals.  Sleeping too little, too much, or both.  Working too much or putting off tasks (procrastination).  Smoking, drinking alcohol, or using drugs to feel better. DIAGNOSIS  Stress is diagnosed through an assessment by your health care provider. Your health care provider will ask questions about your symptoms and any stressful life events.Your health care provider will also ask about your medical history and may order blood tests or other tests. Certain medical conditions and medicine can cause physical symptoms similar to stress. Mental illness  can cause emotional symptoms and unhealthy behaviors similar to stress. Your health care provider may refer you to a mental health professional for further evaluation.  TREATMENT  Stress management is the recommended treatment for stress.The goals of stress management are reducing stressful life events and coping with stress in healthy ways.  Techniques for reducing stressful life events include the following:  Stress identification. Self-monitor for stress and identify  what causes stress for you. These skills may help you to avoid some stressful events.  Time management. Set your priorities, keep a calendar of events, and learn to say "no." These tools can help you avoid making too many commitments. Techniques for coping with stress include the following:  Rethinking the problem. Try to think realistically about stressful events rather than ignoring them or overreacting. Try to find the positives in a stressful situation rather than focusing on the negatives.  Exercise. Physical exercise can release both physical and emotional tension. The key is to find a form of exercise you enjoy and do it regularly.  Relaxation techniques. These relax the body and mind. Examples include yoga, meditation, tai chi, biofeedback, deep breathing, progressive muscle relaxation, listening to music, being out in nature, journaling, and other hobbies. Again, the key is to find one or more that you enjoy and can do regularly.  Healthy lifestyle. Eat a balanced diet, get plenty of sleep, and do not smoke. Avoid using alcohol or drugs to relax.  Strong support network. Spend time with family, friends, or other people you enjoy being around.Express your feelings and talk things over with someone you trust. Counseling or talktherapy with a mental health professional may be helpful if you are having difficulty managing stress on your own. Medicine is typically not recommended for the treatment of stress.Talk to your health care provider if you think you need medicine for symptoms of stress. HOME CARE INSTRUCTIONS  Keep all follow-up visits as directed by your health care provider.  Take all medicines as directed by your health care provider. SEEK MEDICAL CARE IF:  Your symptoms get worse or you start having new symptoms.  You feel overwhelmed by your problems and can no longer manage them on your own. SEEK IMMEDIATE MEDICAL CARE IF:  You feel like hurting yourself or someone  else. Document Released: 03/23/2001 Document Revised: 02/11/2014 Document Reviewed: 05/22/2013 [facility] Patient Information 2015 [place], [place]. This information is not intended to replace advice given to you by your health care provider. Make sure you discuss any questions you have with your health care provider.

## 2015-01-06 ENCOUNTER — Ambulatory Visit (INDEPENDENT_AMBULATORY_CARE_PROVIDER_SITE_OTHER): Payer: Medicare Other | Admitting: Geriatric Medicine

## 2015-01-06 DIAGNOSIS — R002 Palpitations: Secondary | ICD-10-CM

## 2015-02-27 ENCOUNTER — Emergency Department (INDEPENDENT_AMBULATORY_CARE_PROVIDER_SITE_OTHER)
Admission: EM | Admit: 2015-02-27 | Discharge: 2015-02-27 | Disposition: A | Discharge status: Home / Self Care | Payer: Medicare Other | Source: Home / Self Care | Attending: Family Medicine | Admitting: Family Medicine

## 2015-02-27 ENCOUNTER — Encounter (HOSPITAL_COMMUNITY): Payer: Self-pay | Admitting: Emergency Medicine

## 2015-02-27 DIAGNOSIS — M5442 Lumbago with sciatica, left side: Secondary | ICD-10-CM

## 2015-02-27 DIAGNOSIS — M5441 Lumbago with sciatica, right side: Secondary | ICD-10-CM | POA: Diagnosis not present

## 2015-02-27 MED ORDER — HYDROCODONE-ACETAMINOPHEN 5-325 MG PO TABS: 1.0000 | ORAL_TABLET | Freq: Four times a day (QID) | ORAL | Status: DC | PRN

## 2015-02-27 NOTE — ED Provider Notes (Signed)
Erica [name] is a 59 y.o. female who presents to Urgent Care today for back pain and sciatica. Patient has a history of bulging disks in her back with episodes of sciatica in the past. These previously reasonably well controlled with epidural steroid injections by Dr. Nelva Bush. She's had worsening back pain with pain radiating down both legs for the last 3 days. She denies any weakness or numbness or bowel bladder dysfunction. She's tried Vicodin and a muscle relaxer which helps some. No fevers or chills.   Past Medical History  Diagnosis Date  . Hypertension   . ALLERGIC RHINITIS   . Sleep apnea     CPAP hs  . PAF (paroxysmal atrial fibrillation)     pt denies  . Dyslipidemia   . Panic attacks     Hx of depression  . Osteoarthritis   . Obesity   . Depression   . External hemorrhoids   . GERD (gastroesophageal reflux disease)   . Allergy   . Chronic kidney disease    Past Surgical History  Procedure Laterality Date  . Replacement total knee Right   . Abdominal hysterectomy    . Right oophorectomy      No cancer   History  Substance Use Topics  . Smoking status: Never Smoker   . Smokeless tobacco: Never Used  . Alcohol Use: 0.6 oz/week    1 Glasses of wine per week     Comment: one drink a month   ROS as above Medications: No current facility-administered medications for this encounter.   Current Outpatient Prescriptions  Medication Sig Dispense Refill  . atenolol (TENORMIN) 50 MG tablet Take 1 tablet (50 mg total) by mouth daily. 90 tablet 3  . clonazePAM (KLONOPIN) 1 MG tablet Take 1 mg by mouth 2 (two) times daily.    . fexofenadine (ALLEGRA) 180 MG tablet Take 1 tablet (180 mg total) by mouth daily. 90 tablet 3  . fluticasone (FLONASE) 50 MCG/ACT nasal spray Place 1 spray into the nose as needed.    . furosemide (LASIX) 40 MG tablet Take 1 tablet (40 mg total) by mouth 2 (two) times daily. 180 tablet 3  . HYDROcodone-acetaminophen (NORCO/VICODIN) 5-325 MG per  tablet Take 1 tablet by mouth every 6 (six) hours as needed. 15 tablet 0  . hydrocortisone (ANUSOL-HC) 2.5 % rectal cream Place rectally as needed. 30 g 3  . lactulose (CHRONULAC) 10 GM/15ML solution Take 15 mLs (10 g total) by mouth daily. 240 mL 1  . Linaclotide (LINZESS) 145 MCG CAPS capsule Take 1 capsule (145 mcg total) by mouth daily. 90 capsule 3  . meclizine (ANTIVERT) 25 MG tablet Take 1 tablet (25 mg total) by mouth 3 (three) times daily as needed for dizziness or nausea. 180 tablet 3  . meloxicam (MOBIC) 15 MG tablet Take 15 mg by mouth as needed.    Marland Kitchen omeprazole (PRILOSEC) 20 MG capsule Take 1 capsule (20 mg total) by mouth daily. 90 capsule 3  . PARoxetine (PAXIL-CR) 25 MG 24 hr tablet Take 25 mg by mouth daily.    . polyethylene glycol powder (GLYCOLAX/MIRALAX) powder Take 17 g by mouth daily. 3350 g 1  . potassium chloride SA (K-DUR,KLOR-CON) 20 MEQ tablet Take 1 tablet (20 mEq total) by mouth 2 (two) times daily. 180 tablet 3  . simvastatin (ZOCOR) 40 MG tablet Take 1 tablet (40 mg total) by mouth daily at 6 PM. 90 tablet 3  . tizanidine (ZANAFLEX) 2 MG capsule Take 1  capsule (2 mg total) by mouth 3 (three) times daily as needed for muscle spasms. 90 capsule 3  . [DISCONTINUED] oxycodone (OXY-IR) 5 MG capsule Take 5 mg by mouth every 4 (four) hours as needed. For pain.     Allergies  Allergen Reactions  . Codeine Nausea And Vomiting  . Guaifenesin-Codeine     REACTION: feels spacey  . Wellbutrin [Bupropion] Palpitations    hallucinations     Exam:  BP 115/73 mmHg  Pulse 62  Temp(Src) 97.1 F (36.2 C) (Oral)  Resp 16  SpO2 97% Gen: Well NAD HEENT: EOMI,  MMM Lungs: Normal work of breathing. CTABL Heart: RRR no MRG Abd: NABS, Soft. Nondistended, Nontender Exts: Brisk capillary refill, warm and well perfused.  Back: Nontender to spinal midline tender palpation bilateral lumbar paraspinal. Decreased lumbar range of motion due to pain. Flexion is warm. Then extension.  Negative straight leg raise test bilaterally. Lower extremity strength is intact bilaterally. Patient can stand on her toes heel squat and get out of a chair by herself as well as get onto and off exam table by herself. Reflexes are diminished but equal bilateral knees and ankles Sensation is intact throughout   No results found for this or any previous visit (from the past 24 hour(s)). No results found.  Assessment and Plan: 59 y.o. female with lumbago with sciatica. Prescribed 15 tablets of hydrocodone. Encouraged patient to follow-up with Dr. Nelva Bush. We'll avoid prescribing steroids at this time as she may be able to get a epidural steroid injection in the near future.    Discussed warning signs or symptoms. Please see discharge instructions. Patient expresses understanding.     Gregor Hams, MD 02/27/15 [phone]

## 2015-02-27 NOTE — ED Notes (Signed)
Patient c/o lower back pain and bilateral sciatic pain onset Monday. Patient reports she has 2 bulging disc and has rx for pain. Patient reports she took her last Vicodin this morning and a muscle relaxer before noon. Patient is in NAD.

## 2015-02-27 NOTE — Discharge Instructions (Signed)
Thank you for coming in today. Take hydrocodone for severe pain.  Call Dr. Jeralyn Ruths office tomorrow.  Come back or go to the emergency room if you notice new weakness new numbness problems walking or bowel or bladder problems.  Sciatica Sciatica is pain, weakness, numbness, or tingling along the path of the sciatic nerve. The nerve starts in the lower back and runs down the back of each leg. The nerve controls the muscles in the lower leg and in the back of the knee, while also providing sensation to the back of the thigh, lower leg, and the sole of your foot. Sciatica is a symptom of another medical condition. For instance, nerve damage or certain conditions, such as a herniated disk or bone spur on the spine, pinch or put pressure on the sciatic nerve. This causes the pain, weakness, or other sensations normally associated with sciatica. Generally, sciatica only affects one side of the body. CAUSES   Herniated or slipped disc.  Degenerative disk disease.  A pain disorder involving the narrow muscle in the buttocks (piriformis syndrome).  Pelvic injury or fracture.  Pregnancy.  Tumor (rare). SYMPTOMS  Symptoms can vary from mild to very severe. The symptoms usually travel from the low back to the buttocks and down the back of the leg. Symptoms can include:  Mild tingling or dull aches in the lower back, leg, or hip.  Numbness in the back of the calf or sole of the foot.  Burning sensations in the lower back, leg, or hip.  Sharp pains in the lower back, leg, or hip.  Leg weakness.  Severe back pain inhibiting movement. These symptoms may get worse with coughing, sneezing, laughing, or prolonged sitting or standing. Also, being overweight may worsen symptoms. DIAGNOSIS  Your caregiver will perform a physical exam to look for common symptoms of sciatica. He or she may ask you to do certain movements or activities that would trigger sciatic nerve pain. Other tests may be performed to  find the cause of the sciatica. These may include:  Blood tests.  X-rays.  Imaging tests, such as an MRI or CT scan. TREATMENT  Treatment is directed at the cause of the sciatic pain. Sometimes, treatment is not necessary and the pain and discomfort goes away on its own. If treatment is needed, your caregiver may suggest:  Over-the-counter medicines to relieve pain.  Prescription medicines, such as anti-inflammatory medicine, muscle relaxants, or narcotics.  Applying heat or ice to the painful area.  Steroid injections to lessen pain, irritation, and inflammation around the nerve.  Reducing activity during periods of pain.  Exercising and stretching to strengthen your abdomen and improve flexibility of your spine. Your caregiver may suggest losing weight if the extra weight makes the back pain worse.  Physical therapy.  Surgery to eliminate what is pressing or pinching the nerve, such as a bone spur or part of a herniated disk. HOME CARE INSTRUCTIONS   Only take over-the-counter or prescription medicines for pain or discomfort as directed by your caregiver.  Apply ice to the affected area for 20 minutes, 3-4 times a day for the first 48-72 hours. Then try heat in the same way.  Exercise, stretch, or perform your usual activities if these do not aggravate your pain.  Attend physical therapy sessions as directed by your caregiver.  Keep all follow-up appointments as directed by your caregiver.  Do not wear high heels or shoes that do not provide proper support.  Check your mattress to see  if it is too soft. A firm mattress may lessen your pain and discomfort. SEEK IMMEDIATE MEDICAL CARE IF:   You lose control of your bowel or bladder (incontinence).  You have increasing weakness in the lower back, pelvis, buttocks, or legs.  You have redness or swelling of your back.  You have a burning sensation when you urinate.  You have pain that gets worse when you lie down or  awakens you at night.  Your pain is worse than you have experienced in the past.  Your pain is lasting longer than 4 weeks.  You are suddenly losing weight without reason. MAKE SURE YOU:  Understand these instructions.  Will watch your condition.  Will get help right away if you are not doing well or get worse. Document Released: 09/21/2001 Document Revised: 03/28/2012 Document Reviewed: 02/06/2012 [facility] Patient Information 2015 [place], [place]. This information is not intended to replace advice given to you by your health care provider. Make sure you discuss any questions you have with your health care provider.

## 2015-03-04 DIAGNOSIS — M5410 Radiculopathy, site unspecified: Secondary | ICD-10-CM | POA: Diagnosis not present

## 2015-03-04 DIAGNOSIS — M5136 Other intervertebral disc degeneration, lumbar region: Secondary | ICD-10-CM | POA: Diagnosis not present

## 2015-03-07 DIAGNOSIS — M4306 Spondylolysis, lumbar region: Secondary | ICD-10-CM | POA: Diagnosis not present

## 2015-03-07 DIAGNOSIS — M5116 Intervertebral disc disorders with radiculopathy, lumbar region: Secondary | ICD-10-CM | POA: Diagnosis not present

## 2015-03-07 DIAGNOSIS — M4726 Other spondylosis with radiculopathy, lumbar region: Secondary | ICD-10-CM | POA: Diagnosis not present

## 2015-03-07 DIAGNOSIS — M5416 Radiculopathy, lumbar region: Secondary | ICD-10-CM | POA: Diagnosis not present

## 2015-03-07 DIAGNOSIS — M5136 Other intervertebral disc degeneration, lumbar region: Secondary | ICD-10-CM | POA: Diagnosis not present

## 2015-03-12 ENCOUNTER — Encounter: Payer: Self-pay | Admitting: Internal Medicine

## 2015-03-12 ENCOUNTER — Other Ambulatory Visit (INDEPENDENT_AMBULATORY_CARE_PROVIDER_SITE_OTHER): Payer: Medicare Other

## 2015-03-12 ENCOUNTER — Ambulatory Visit (INDEPENDENT_AMBULATORY_CARE_PROVIDER_SITE_OTHER): Payer: Medicare Other | Admitting: Internal Medicine

## 2015-03-12 DIAGNOSIS — G4733 Obstructive sleep apnea (adult) (pediatric): Secondary | ICD-10-CM | POA: Diagnosis not present

## 2015-03-12 DIAGNOSIS — R739 Hyperglycemia, unspecified: Secondary | ICD-10-CM

## 2015-03-12 DIAGNOSIS — I10 Essential (primary) hypertension: Secondary | ICD-10-CM | POA: Diagnosis not present

## 2015-03-12 DIAGNOSIS — E785 Hyperlipidemia, unspecified: Secondary | ICD-10-CM

## 2015-03-12 LAB — LIPID PANEL
Cholesterol: 164 mg/dL (ref 0–200)
HDL: 45.6 mg/dL (ref >39.00)
LDL Cholesterol: 90 mg/dL (ref 0–99)
NonHDL: 118.4
Total CHOL/HDL Ratio: 4
Triglycerides: 143 mg/dL (ref 0.0–149.0)
VLDL: 28.6 mg/dL (ref 0.0–40.0)

## 2015-03-12 LAB — HEMOGLOBIN A1C: Hgb A1c MFr Bld: 6 % (ref 4.6–6.5)

## 2015-03-12 MED ORDER — ATENOLOL 50 MG PO TABS: 50.0000 mg | ORAL_TABLET | Freq: Every day | ORAL | Status: DC

## 2015-03-12 MED ORDER — FLUTICASONE PROPIONATE 50 MCG/ACT NA SUSP: 1.0000 | NASAL | Status: DC | PRN

## 2015-03-12 MED ORDER — FUROSEMIDE 40 MG PO TABS: 40.0000 mg | ORAL_TABLET | Freq: Two times a day (BID) | ORAL | Status: DC

## 2015-03-12 MED ORDER — OMEPRAZOLE 20 MG PO CPDR: 20.0000 mg | DELAYED_RELEASE_CAPSULE | Freq: Every day | ORAL | Status: DC

## 2015-03-12 MED ORDER — SIMVASTATIN 40 MG PO TABS: 40.0000 mg | ORAL_TABLET | Freq: Every day | ORAL | Status: DC

## 2015-03-12 MED ORDER — FEXOFENADINE HCL 180 MG PO TABS: 180.0000 mg | ORAL_TABLET | Freq: Every day | ORAL | Status: DC

## 2015-03-12 MED ORDER — TIZANIDINE HCL 2 MG PO CAPS: 2.0000 mg | ORAL_CAPSULE | Freq: Three times a day (TID) | ORAL | Status: DC | PRN

## 2015-03-12 NOTE — Assessment & Plan Note (Signed)
The current medical regimen is effective;  continue present plan and medications. BP Readings from Last 3 Encounters:  03/12/15 130/80  02/27/15 115/73  12/31/14 118/82

## 2015-03-12 NOTE — Progress Notes (Signed)
Subjective:    Patient ID: Erica [name], female    DOB: [birth year], 59 y.o.   MRN: [id number]  HPI  Patient here for follow-up. Reviewed chronic medical issues, interval events and current concerns  Past Medical History  Diagnosis Date  . Hypertension   . ALLERGIC RHINITIS   . Sleep apnea     CPAP hs  . PAF (paroxysmal atrial fibrillation)     pt denies  . Dyslipidemia   . Panic attacks     Hx of depression  . Osteoarthritis   . Obesity   . Depression   . External hemorrhoids   . GERD (gastroesophageal reflux disease)   . Allergy   . Chronic kidney disease   . DDD (degenerative disc disease), lumbar     ESI with Ramos (spring 2016)    Review of Systems  Constitutional: Positive for fatigue. Negative for unexpected weight change.  Respiratory: Negative for cough and shortness of breath.   Cardiovascular: Negative for chest pain and leg swelling.  Musculoskeletal: Positive for back pain (chronic w/ left sciatica - onoging ESI with Ramos, last one 5 days ago), arthralgias and gait problem.  Psychiatric/Behavioral:       Recovering grief - death of eldest son 10-12-14 from CHF        Objective:    Physical Exam  Constitutional: Erica [name] appears well-developed and well-nourished. No distress.  obese  Cardiovascular: Normal rate, regular rhythm and normal heart sounds.   No murmur heard. Pulmonary/Chest: Effort normal and breath sounds normal. No respiratory distress.  Musculoskeletal: Erica [name] exhibits no edema.    BP 130/80 mmHg  Pulse 68  Temp(Src) 97.9 F (36.6 C) (Oral)  Ht 5\' 7"  (1.702 m)  Wt 250 lb 8 oz (113.626 kg)  BMI 39.22 kg/m2  SpO2 98% Wt Readings from Last 3 Encounters:  03/12/15 250 lb 8 oz (113.626 kg)  12/31/14 254 lb 12.8 oz (115.577 kg)  09/09/14 253 lb 8 oz (114.987 kg)     Lab Results  Component Value Date   WBC 7.1 04/22/2014   HGB 11.8* 04/22/2014   HCT 36.0 04/22/2014   PLT 223.0 04/22/2014   GLUCOSE 100* 12/31/2014   CHOL 165  04/22/2014   TRIG 174.0* 04/22/2014   HDL 43.10 04/22/2014   LDLCALC 87 04/22/2014   ALT 10 12/31/2014   AST 15 12/31/2014   NA 141 12/31/2014   K 3.8 12/31/2014   CL 106 12/31/2014   CREATININE 0.69 12/31/2014   BUN 16 12/31/2014   CO2 31 12/31/2014   TSH 3.27 07/13/2012   HGBA1C 6.2 01/23/2009    No results found.     Assessment & Plan:   Problem List Items Addressed This Visit    Essential hypertension, benign    The current medical regimen is effective;  continue present plan and medications. BP Readings from Last 3 Encounters:  03/12/15 130/80  02/27/15 115/73  12/31/14 118/82          Hyperlipidemia    The current medical regimen is effective (simva, fish oil);  continue present plan and medications. Check labs annually Lab Results  Component Value Date   LDLCALC 87 04/22/2014        Morbid obesity - Primary    Wt Readings from Last 3 Encounters:  03/12/15 250 lb 8 oz (113.626 kg)  12/31/14 254 lb 12.8 oz (115.577 kg)  09/09/14 253 lb 8 oz (114.987 kg)   weight trends reviewed Patient active with aerobic exercise at  pool and working on calorie restriction We will make referral to bariatric surgery for potential intervention evaluation The patient is asked to make continued efforts to improve diet and exercise patterns to aid in medical management of this problem.      Obstructive sleep apnea    Follows annually with pulmonary sleep medicine for same Mild OSA on CPAP daily at bedtime Counseled on need for weight reduction to help manage same          Gwendolyn Grant, MD

## 2015-03-12 NOTE — Assessment & Plan Note (Signed)
Follows annually with pulmonary sleep medicine for same Mild OSA on CPAP daily at bedtime Counseled on need for weight reduction to help manage same

## 2015-03-12 NOTE — Assessment & Plan Note (Signed)
Wt Readings from Last 3 Encounters:  03/12/15 250 lb 8 oz (113.626 kg)  12/31/14 254 lb 12.8 oz (115.577 kg)  09/09/14 253 lb 8 oz (114.987 kg)   weight trends reviewed Patient active with aerobic exercise at pool and working on calorie restriction We will make referral to bariatric surgery for potential intervention evaluation The patient is asked to make continued efforts to improve diet and exercise patterns to aid in medical management of this problem.

## 2015-03-12 NOTE — Assessment & Plan Note (Signed)
Reviewed strong family history of diabetes Likely exacerbated by recent steroid injections for back issues Check A1c Encouraged on need for weight loss to control same

## 2015-03-12 NOTE — Patient Instructions (Signed)
It was good to see you today.  We have reviewed your prior records including labs and tests today  Test(s) ordered today. Your results will be released to [place] (or called to you) after review, usually within 72hours after test completion. If any changes need to be made, you will be notified at that same time.  Medications reviewed and updated, no changes recommended at this time. Refill on medication(s) as discussed today.  we'll make referral to surgery for consideration of bariatric intervention to assist with weight loss goals. Our office will contact you regarding appointment(s) once made.  Work on lifestyle changes as discussed (low fat, low carb, increased protein diet; improved exercise efforts; weight loss) to control sugar, blood pressure and cholesterol levels and/or reduce risk of developing other medical problems. Look into [email or web address] or other type of food journal to assist you in this process.   Please schedule followup in 6 months, call sooner if problems.

## 2015-03-12 NOTE — Progress Notes (Signed)
Pre visit review using our clinic review tool, if applicable. No additional management support is needed unless otherwise documented below in the visit note. 

## 2015-03-12 NOTE — Assessment & Plan Note (Signed)
The current medical regimen is effective (simva, fish oil);  continue present plan and medications. Check labs annually Lab Results  Component Value Date   LDLCALC 87 04/22/2014

## 2015-03-19 ENCOUNTER — Other Ambulatory Visit: Payer: Self-pay

## 2015-03-19 MED ORDER — FLUTICASONE PROPIONATE 50 MCG/ACT NA SUSP: NASAL | Status: DC

## 2015-03-20 ENCOUNTER — Telehealth: Payer: Self-pay | Admitting: Internal Medicine

## 2015-03-20 NOTE — Telephone Encounter (Signed)
Patient is out of atenolol (TENORMIN) 50 MG tablet [[id number] and I did inform her that a prescription was sent over to [facility] by mail but since she is out, if you can send her some in to [facility] on [address]. Also, she would like a phone call to discuss labs, which I also read to her Dr. Katheren Puller note of no changes needed.

## 2015-03-21 MED ORDER — ATENOLOL 50 MG PO TABS: 50.0000 mg | ORAL_TABLET | Freq: Every day | ORAL | Status: DC

## 2015-03-21 NOTE — Telephone Encounter (Signed)
Sent 30 day to [facility]....Erica [name]

## 2015-03-25 DIAGNOSIS — F411 Generalized anxiety disorder: Secondary | ICD-10-CM | POA: Diagnosis not present

## 2015-03-25 DIAGNOSIS — F331 Major depressive disorder, recurrent, moderate: Secondary | ICD-10-CM | POA: Diagnosis not present

## 2015-04-24 DIAGNOSIS — R234 Changes in skin texture: Secondary | ICD-10-CM | POA: Diagnosis not present

## 2015-06-17 DIAGNOSIS — F331 Major depressive disorder, recurrent, moderate: Secondary | ICD-10-CM | POA: Diagnosis not present

## 2015-06-17 DIAGNOSIS — F411 Generalized anxiety disorder: Secondary | ICD-10-CM | POA: Diagnosis not present

## 2015-06-30 ENCOUNTER — Telehealth: Payer: Self-pay | Admitting: *Deleted

## 2015-06-30 DIAGNOSIS — R141 Gas pain: Secondary | ICD-10-CM

## 2015-06-30 DIAGNOSIS — R142 Eructation: Principal | ICD-10-CM

## 2015-06-30 DIAGNOSIS — R143 Flatulence: Principal | ICD-10-CM

## 2015-06-30 MED ORDER — LINACLOTIDE 145 MCG PO CAPS: 145.0000 ug | ORAL_CAPSULE | Freq: Every day | ORAL | Status: DC

## 2015-06-30 NOTE — Telephone Encounter (Signed)
Receive call pt states she is needing refill on her Linzess sent to [place]. Inform pt will send...Johny Chess

## 2015-07-10 DIAGNOSIS — L739 Follicular disorder, unspecified: Secondary | ICD-10-CM | POA: Diagnosis not present

## 2015-08-14 ENCOUNTER — Ambulatory Visit (INDEPENDENT_AMBULATORY_CARE_PROVIDER_SITE_OTHER): Payer: Medicare Other | Admitting: Internal Medicine

## 2015-08-14 ENCOUNTER — Encounter: Payer: Self-pay | Admitting: Internal Medicine

## 2015-08-14 VITALS — BP 128/70 | HR 74 | Temp 98.5°F | Wt 263.0 lb

## 2015-08-14 DIAGNOSIS — J209 Acute bronchitis, unspecified: Secondary | ICD-10-CM | POA: Diagnosis not present

## 2015-08-14 DIAGNOSIS — I10 Essential (primary) hypertension: Secondary | ICD-10-CM | POA: Diagnosis not present

## 2015-08-14 DIAGNOSIS — J301 Allergic rhinitis due to pollen: Secondary | ICD-10-CM

## 2015-08-14 MED ORDER — FLUCONAZOLE 150 MG PO TABS: 150.0000 mg | ORAL_TABLET | Freq: Once | ORAL | Status: DC

## 2015-08-14 MED ORDER — HYDROCOD POLST-CPM POLST ER 10-8 MG/5ML PO SUER: 5.0000 mL | Freq: Two times a day (BID) | ORAL | Status: DC | PRN

## 2015-08-14 MED ORDER — LEVOFLOXACIN 500 MG PO TABS: 500.0000 mg | ORAL_TABLET | Freq: Every day | ORAL | Status: DC

## 2015-08-14 NOTE — Patient Instructions (Signed)
Use over-the-counter  "cold" medicines  such as "Tylenol cold" , "Advil cold",  "Mucinex" or" Mucinex D"  for cough and congestion.   Avoid decongestants if you have high blood pressure and use "Afrin" nasal spray for nasal congestion as directed instead. Use" Delsym" or" Robitussin" cough syrup varietis for cough.  You can use plain "Tylenol" or "Advil" for fever, chills and achyness. Use Halls or Ricola cough drops.  Please, make an appointment if you are not better or if you're worse.  

## 2015-08-14 NOTE — Progress Notes (Signed)
Pre visit review using our clinic review tool, if applicable. No additional management support is needed unless otherwise documented below in the visit note. 

## 2015-08-14 NOTE — Assessment & Plan Note (Signed)
Atenolol - well controlled

## 2015-08-14 NOTE — Assessment & Plan Note (Signed)
11/16 +sinusitis: Levaquin x 10 d. Tussionex syr prn (pt can use hydrocodone). Diflucan prn

## 2015-08-14 NOTE — Progress Notes (Signed)
Subjective:  Patient ID: Erica [name], female    DOB: 02/11/1956  Age: 59 y.o. MRN: [id number]  CC: No chief complaint on file.   HPI Erica [name] presents for URI sx's x 1 week - achy, ST, productive cough w/brown mucus.  Outpatient Prescriptions Prior to Visit  Medication Sig Dispense Refill  . atenolol (TENORMIN) 50 MG tablet Take 1 tablet (50 mg total) by mouth daily. 90 tablet 3  . clonazePAM (KLONOPIN) 1 MG tablet Take 1 mg by mouth 2 (two) times daily.    . fexofenadine (ALLEGRA) 180 MG tablet Take 1 tablet (180 mg total) by mouth daily. 90 tablet 3  . fluticasone (FLONASE) 50 MCG/ACT nasal spray New Sig: Spray once into each nostril up to twice a day as needed for . 16 g 2  . furosemide (LASIX) 40 MG tablet Take 1 tablet (40 mg total) by mouth 2 (two) times daily. 180 tablet 3  . HYDROcodone-acetaminophen (NORCO/VICODIN) 5-325 MG per tablet Take 1 tablet by mouth every 6 (six) hours as needed. 15 tablet 0  . hydrocortisone (ANUSOL-HC) 2.5 % rectal cream Place rectally as needed. 30 g 3  . lactulose (CHRONULAC) 10 GM/15ML solution Take 15 mLs (10 g total) by mouth daily. 240 mL 1  . Linaclotide (LINZESS) 145 MCG CAPS capsule Take 1 capsule (145 mcg total) by mouth daily. 90 capsule 3  . meclizine (ANTIVERT) 25 MG tablet Take 1 tablet (25 mg total) by mouth 3 (three) times daily as needed for dizziness or nausea. 180 tablet 3  . omeprazole (PRILOSEC) 20 MG capsule Take 1 capsule (20 mg total) by mouth daily. 90 capsule 3  . PARoxetine (PAXIL-CR) 25 MG 24 hr tablet Take 25 mg by mouth daily.    . potassium chloride SA (K-DUR,KLOR-CON) 20 MEQ tablet Take 1 tablet (20 mEq total) by mouth 2 (two) times daily. 180 tablet 3  . simvastatin (ZOCOR) 40 MG tablet Take 1 tablet (40 mg total) by mouth daily at 6 PM. 90 tablet 3  . tizanidine (ZANAFLEX) 2 MG capsule Take 1 capsule (2 mg total) by mouth 3 (three) times daily as needed for muscle spasms. 270 capsule 1   No  facility-administered medications prior to visit.    ROS Review of Systems  Constitutional: Negative for chills, activity change, appetite change, fatigue and unexpected weight change.  HENT: Positive for postnasal drip, rhinorrhea, sinus pressure and sore throat. Negative for congestion and mouth sores.   Eyes: Negative for visual disturbance.  Respiratory: Positive for cough and shortness of breath. Negative for chest tightness.   Cardiovascular: Positive for leg swelling.  Gastrointestinal: Negative for nausea and abdominal pain.  Genitourinary: Negative for frequency, difficulty urinating and vaginal pain.  Musculoskeletal: Negative for back pain and gait problem.  Skin: Negative for pallor and rash.  Neurological: Negative for dizziness, tremors, weakness, numbness and headaches.  Psychiatric/Behavioral: Negative for confusion and sleep disturbance. The patient is nervous/anxious.     Objective:  There were no vitals taken for this visit.  BP Readings from Last 3 Encounters:  03/12/15 130/80  02/27/15 115/73  12/31/14 118/82    Wt Readings from Last 3 Encounters:  03/12/15 250 lb 8 oz (113.626 kg)  12/31/14 254 lb 12.8 oz (115.577 kg)  09/09/14 253 lb 8 oz (114.987 kg)    Physical Exam  Constitutional: She appears well-developed. No distress.  HENT:  Head: Normocephalic.  Right Ear: External ear normal.  Left Ear: External ear normal.  Nose: Nose normal.  Mouth/Throat: Oropharynx is clear and moist.  Eyes: Conjunctivae are normal. Pupils are equal, round, and reactive to light. Right eye exhibits no discharge. Left eye exhibits no discharge.  Neck: Normal range of motion. Neck supple. No JVD present. No tracheal deviation present. No thyromegaly present.  Cardiovascular: Normal rate, regular rhythm and normal heart sounds.   Pulmonary/Chest: No stridor. No respiratory distress. She has no wheezes.  Abdominal: Soft. Bowel sounds are normal. She exhibits no distension  and no mass. There is no tenderness. There is no rebound and no guarding.  Musculoskeletal: She exhibits no edema or tenderness.  Lymphadenopathy:    She has no cervical adenopathy.  Neurological: She displays normal reflexes. No cranial nerve deficit. She exhibits normal muscle tone. Coordination normal.  Skin: No rash noted. No erythema.  Psychiatric: She has a normal mood and affect. Her behavior is normal. Judgment and thought content normal.  eryth throat and nasal mucosa Obese B trace edema  Lab Results  Component Value Date   WBC 7.1 04/22/2014   HGB 11.8* 04/22/2014   HCT 36.0 04/22/2014   PLT 223.0 04/22/2014   GLUCOSE 100* 12/31/2014   CHOL 164 03/12/2015   TRIG 143.0 03/12/2015   HDL 45.60 03/12/2015   LDLCALC 90 03/12/2015   ALT 10 12/31/2014   AST 15 12/31/2014   NA 141 12/31/2014   K 3.8 12/31/2014   CL 106 12/31/2014   CREATININE 0.69 12/31/2014   BUN 16 12/31/2014   CO2 31 12/31/2014   TSH 3.27 07/13/2012   HGBA1C 6.0 03/12/2015    No results found.  Assessment & Plan:   There are no diagnoses linked to this encounter. I am having Ms. Florer maintain her PARoxetine, lactulose, clonazePAM, hydrocortisone, meclizine, potassium chloride SA, HYDROcodone-acetaminophen, fexofenadine, furosemide, omeprazole, simvastatin, tizanidine, fluticasone, atenolol, and Linaclotide.  No orders of the defined types were placed in this encounter.     Follow-up: No Follow-up on file.  Walker Kehr, MD

## 2015-08-14 NOTE — Assessment & Plan Note (Signed)
Allegra, Flonase 

## 2015-08-22 ENCOUNTER — Ambulatory Visit (INDEPENDENT_AMBULATORY_CARE_PROVIDER_SITE_OTHER): Payer: Medicare Other | Admitting: Internal Medicine

## 2015-08-22 ENCOUNTER — Encounter: Payer: Self-pay | Admitting: Internal Medicine

## 2015-08-22 VITALS — BP 120/76 | HR 70 | Temp 98.5°F | Resp 16 | Wt 263.0 lb

## 2015-08-22 DIAGNOSIS — M25511 Pain in right shoulder: Secondary | ICD-10-CM | POA: Diagnosis not present

## 2015-08-22 DIAGNOSIS — Z23 Encounter for immunization: Secondary | ICD-10-CM | POA: Diagnosis not present

## 2015-08-22 DIAGNOSIS — M7581 Other shoulder lesions, right shoulder: Secondary | ICD-10-CM | POA: Diagnosis not present

## 2015-08-22 NOTE — Progress Notes (Signed)
Pre visit review using our clinic review tool, if applicable. No additional management support is needed unless otherwise documented below in the visit note. 

## 2015-08-22 NOTE — Patient Instructions (Addendum)
A xray was ordered for your right shoulder - we will call you with the results.  An urgent referral was ordered for orthopedics.  Take aleve or advil for the pain - take with food and stop the medication if you experience stomach upset.

## 2015-08-22 NOTE — Progress Notes (Signed)
Subjective:    Patient ID: Erica [name], female    DOB: 1956-06-10, 60 y.o.   MRN: [id number]  HPI She is here for an acute visit for right arm pain.  She first started having pain in her right hand last week that felt like an ache.  She denies any injury or new activities and ignored the pain.  Earlier this week the pain moved up to her right forearm and then her shoulder.  Her entire shoulder joint hurts.  Two days ago she had severely decreased range of motion of her right shoulder.  She denies swelling of the shoulder or arm.  She denies numbness/tingling in the arm.  The shoulder feels weak, but she denies hand weakness.  She is right handed.  She did take a 1/2 vicodin she had a home and it did not help the pain.    She denies neck and upper pain pain on the right.  She has some tightness in the right upper back, but she feels that is related to stress.  Her pain level is 9/10 and she is having difficulty performing her ADLs.  She denies prior shoulder injury/pain.      Medications and allergies reviewed with patient and updated if appropriate.  Patient Active Problem List   Diagnosis Date Noted  . Acute bronchitis 08/14/2015  . Hyperglycemia 03/12/2015  . Orthostatic hypotension 09/20/2013  . Abnormal ECG 09/11/2012  . Palpitations 09/08/2012  . Sciatica of left side   . Peripheral edema 05/09/2012  . FATIGUE 02/23/2010  . Obstructive sleep apnea 10/31/2009  . Morbid obesity ([place]) 10/17/2009  . HEMORRHOIDS, EXTERNAL 10/17/2009  . CONSTIPATION, CHRONIC 10/17/2009  . URTICARIA 03/11/2009  . Hyperlipidemia 01/12/2008  . Depression, major, recurrent ([place]) 01/12/2008  . Essential hypertension, benign 01/12/2008  . Allergic rhinitis 01/12/2008    Current Outpatient Prescriptions on File Prior to Visit  Medication Sig Dispense Refill  . atenolol (TENORMIN) 50 MG tablet Take 1 tablet (50 mg total) by mouth daily. 90 tablet 3  . chlorpheniramine-HYDROcodone (TUSSIONEX  PENNKINETIC ER) 10-8 MG/5ML SUER Take 5 mLs by mouth every 12 (twelve) hours as needed for cough. 100 mL 0  . clonazePAM (KLONOPIN) 1 MG tablet Take 1 mg by mouth 2 (two) times daily.    . fluconazole (DIFLUCAN) 150 MG tablet Take 1 tablet (150 mg total) by mouth once. 1 tablet 1  . fluticasone (FLONASE) 50 MCG/ACT nasal spray New Sig: Spray once into each nostril up to twice a day as needed for . 16 g 2  . furosemide (LASIX) 40 MG tablet Take 1 tablet (40 mg total) by mouth 2 (two) times daily. 180 tablet 3  . hydrocortisone (ANUSOL-HC) 2.5 % rectal cream Place rectally as needed. 30 g 3  . lactulose (CHRONULAC) 10 GM/15ML solution Take 15 mLs (10 g total) by mouth daily. 240 mL 1  . levofloxacin (LEVAQUIN) 500 MG tablet Take 1 tablet (500 mg total) by mouth daily. 10 tablet 0  . Linaclotide (LINZESS) 145 MCG CAPS capsule Take 1 capsule (145 mcg total) by mouth daily. 90 capsule 3  . meclizine (ANTIVERT) 25 MG tablet Take 1 tablet (25 mg total) by mouth 3 (three) times daily as needed for dizziness or nausea. 180 tablet 3  . omeprazole (PRILOSEC) 20 MG capsule Take 1 capsule (20 mg total) by mouth daily. 90 capsule 3  . PARoxetine (PAXIL-CR) 25 MG 24 hr tablet Take 25 mg by mouth daily.    Marland Kitchen  potassium chloride SA (K-DUR,KLOR-CON) 20 MEQ tablet Take 1 tablet (20 mEq total) by mouth 2 (two) times daily. 180 tablet 3  . simvastatin (ZOCOR) 40 MG tablet Take 1 tablet (40 mg total) by mouth daily at 6 PM. 90 tablet 3  . tizanidine (ZANAFLEX) 2 MG capsule Take 1 capsule (2 mg total) by mouth 3 (three) times daily as needed for muscle spasms. 270 capsule 1  . [DISCONTINUED] oxycodone (OXY-IR) 5 MG capsule Take 5 mg by mouth every 4 (four) hours as needed. For pain.     No current facility-administered medications on file prior to visit.    Past Medical History  Diagnosis Date  . Hypertension   . ALLERGIC RHINITIS   . Sleep apnea     CPAP hs  . PAF (paroxysmal atrial fibrillation) ([place])     pt  denies  . Dyslipidemia   . Panic attacks     Hx of depression  . Osteoarthritis   . Obesity   . Depression   . External hemorrhoids   . GERD (gastroesophageal reflux disease)   . Allergy   . Chronic kidney disease   . DDD (degenerative disc disease), lumbar     ESI with Ramos (spring 2016)    Past Surgical History  Procedure Laterality Date  . Replacement total knee Right   . Abdominal hysterectomy    . Right oophorectomy      No cancer    Social History   Social History  . Marital Status: Married    Spouse Name: N/A  . Number of Children: 3  . Years of Education: N/A   Occupational History  . disabled    Social History Main Topics  . Smoking status: Never Smoker   . Smokeless tobacco: Never Used  . Alcohol Use: 0.6 oz/week    1 Glasses of wine per week     Comment: one drink a month  . Drug Use: No  . Sexual Activity: Not Currently   Other Topics Concern  . None   Social History Narrative   Married with children. Pt is on disablity. Previous worked in the school system    Review of Systems  Constitutional: Negative for fever and chills.  Musculoskeletal: Negative for joint swelling.  Neurological: Positive for weakness (in shoulder joint, no hand weakness). Negative for numbness.       Objective:   Filed Vitals:   08/22/15 0808  BP: 120/76  Pulse: 70  Temp: 98.5 F (36.9 C)  Resp: 16   Filed Weights   08/22/15 0808  Weight: 263 lb (119.296 kg)   Body mass index is 41.18 kg/(m^2).   Physical Exam  A Right Shoulder exam was performed.   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

## 2015-09-12 DIAGNOSIS — M7581 Other shoulder lesions, right shoulder: Secondary | ICD-10-CM | POA: Diagnosis not present

## 2015-09-12 DIAGNOSIS — M25511 Pain in right shoulder: Secondary | ICD-10-CM | POA: Diagnosis not present

## 2015-09-16 DIAGNOSIS — Z1231 Encounter for screening mammogram for malignant neoplasm of breast: Secondary | ICD-10-CM | POA: Diagnosis not present

## 2015-09-16 DIAGNOSIS — Z124 Encounter for screening for malignant neoplasm of cervix: Secondary | ICD-10-CM | POA: Diagnosis not present

## 2015-09-16 DIAGNOSIS — Z01419 Encounter for gynecological examination (general) (routine) without abnormal findings: Secondary | ICD-10-CM | POA: Diagnosis not present

## 2015-09-17 ENCOUNTER — Other Ambulatory Visit: Payer: Medicare Other

## 2015-09-17 ENCOUNTER — Ambulatory Visit: Payer: Medicare Other | Admitting: Internal Medicine

## 2015-09-17 ENCOUNTER — Encounter: Payer: Self-pay | Admitting: Internal Medicine

## 2015-09-17 ENCOUNTER — Other Ambulatory Visit (INDEPENDENT_AMBULATORY_CARE_PROVIDER_SITE_OTHER): Payer: Medicare Other

## 2015-09-17 ENCOUNTER — Ambulatory Visit (INDEPENDENT_AMBULATORY_CARE_PROVIDER_SITE_OTHER): Payer: Medicare Other | Admitting: Internal Medicine

## 2015-09-17 VITALS — BP 124/80 | HR 65 | Temp 98.3°F | Resp 18 | Ht 66.5 in | Wt 258.0 lb

## 2015-09-17 DIAGNOSIS — R739 Hyperglycemia, unspecified: Secondary | ICD-10-CM

## 2015-09-17 DIAGNOSIS — E785 Hyperlipidemia, unspecified: Secondary | ICD-10-CM

## 2015-09-17 DIAGNOSIS — I10 Essential (primary) hypertension: Secondary | ICD-10-CM

## 2015-09-17 DIAGNOSIS — Z139 Encounter for screening, unspecified: Secondary | ICD-10-CM

## 2015-09-17 DIAGNOSIS — J0100 Acute maxillary sinusitis, unspecified: Secondary | ICD-10-CM

## 2015-09-17 LAB — LIPID PANEL
Cholesterol: 218 mg/dL — ABNORMAL HIGH (ref 0–200)
HDL: 56.1 mg/dL (ref >39.00)
LDL Cholesterol: 143 mg/dL — ABNORMAL HIGH (ref 0–99)
NonHDL: 161.82
Total CHOL/HDL Ratio: 4
Triglycerides: 93 mg/dL (ref 0.0–149.0)
VLDL: 18.6 mg/dL (ref 0.0–40.0)

## 2015-09-17 LAB — COMPREHENSIVE METABOLIC PANEL
ALT: 10 U/L (ref 0–35)
AST: 13 U/L (ref 0–37)
Albumin: 3.8 g/dL (ref 3.5–5.2)
Alkaline Phosphatase: 155 U/L — ABNORMAL HIGH (ref 39–117)
BUN: 17 mg/dL (ref 6–23)
CO2: 32 mEq/L (ref 19–32)
Calcium: 9.7 mg/dL (ref 8.4–10.5)
Chloride: 107 mEq/L (ref 96–112)
Creatinine, Ser: 0.81 mg/dL (ref 0.40–1.20)
GFR: 92.2 mL/min (ref >60.00)
Glucose, Bld: 103 mg/dL — ABNORMAL HIGH (ref 70–99)
Potassium: 5.6 mEq/L — ABNORMAL HIGH (ref 3.5–5.1)
Sodium: 146 mEq/L — ABNORMAL HIGH (ref 135–145)
Total Bilirubin: 0.4 mg/dL (ref 0.2–1.2)
Total Protein: 7.5 g/dL (ref 6.0–8.3)

## 2015-09-17 LAB — TSH: TSH: 2.38 u[IU]/mL (ref 0.35–4.50)

## 2015-09-17 LAB — HEMOGLOBIN A1C: Hgb A1c MFr Bld: 6.3 % (ref 4.6–6.5)

## 2015-09-17 NOTE — Patient Instructions (Addendum)
You likely have a sinus infection.  We have reviewed your prior records including labs and tests today.  Test(s) ordered today. Your results will be released to [place] (or called to you) after review, usually within 72hours after test completion. If any changes need to be made, you will be notified at that same time.  No immunizations administered today.   Medications reviewed and updated.  No changes recommended at this time.   Please schedule followup in 6 months for a wellness visit and follow up visit  Sinusitis, Adult Sinusitis is redness, soreness, and inflammation of the paranasal sinuses. Paranasal sinuses are air pockets within the bones of your face. They are located beneath your eyes, in the middle of your forehead, and above your eyes. In healthy paranasal sinuses, mucus is able to drain out, and air is able to circulate through them by way of your nose. However, when your paranasal sinuses are inflamed, mucus and air can become trapped. This can allow bacteria and other germs to grow and cause infection. Sinusitis can develop quickly and last only a short time (acute) or continue over a long period (chronic). Sinusitis that lasts for more than 12 weeks is considered chronic. CAUSES Causes of sinusitis include:  Allergies.  Structural abnormalities, such as displacement of the cartilage that separates your nostrils (deviated septum), which can decrease the air flow through your nose and sinuses and affect sinus drainage.  Functional abnormalities, such as when the small hairs (cilia) that line your sinuses and help remove mucus do not work properly or are not present. SIGNS AND SYMPTOMS Symptoms of acute and chronic sinusitis are the same. The primary symptoms are pain and pressure around the affected sinuses. Other symptoms include:  Upper toothache.  Earache.  Headache.  Bad breath.  Decreased sense of smell and taste.  A cough, which worsens when you are lying  flat.  Fatigue.  Fever.  Thick drainage from your nose, which often is green and may contain pus (purulent).  Swelling and warmth over the affected sinuses. DIAGNOSIS Your health care provider will perform a physical exam. During your exam, your health care provider may perform any of the following to help determine if you have acute sinusitis or chronic sinusitis:  Look in your nose for signs of abnormal growths in your nostrils (nasal polyps).  Tap over the affected sinus to check for signs of infection.  View the inside of your sinuses using an imaging device that has a light attached (endoscope). If your health care provider suspects that you have chronic sinusitis, one or more of the following tests may be recommended:  Allergy tests.  Nasal culture. A sample of mucus is taken from your nose, sent to a lab, and screened for bacteria.  Nasal cytology. A sample of mucus is taken from your nose and examined by your health care provider to determine if your sinusitis is related to an allergy. TREATMENT Most cases of acute sinusitis are related to a viral infection and will resolve on their own within 10 days. Sometimes, medicines are prescribed to help relieve symptoms of both acute and chronic sinusitis. These may include pain medicines, decongestants, nasal steroid sprays, or saline sprays. However, for sinusitis related to a bacterial infection, your health care provider will prescribe antibiotic medicines. These are medicines that will help kill the bacteria causing the infection. Rarely, sinusitis is caused by a fungal infection. In these cases, your health care provider will prescribe antifungal medicine. For some cases of  chronic sinusitis, surgery is needed. Generally, these are cases in which sinusitis recurs more than 3 times per year, despite other treatments. HOME CARE INSTRUCTIONS  Drink plenty of water. Water helps thin the mucus so your sinuses can drain more  easily.  Use a humidifier.  Inhale steam 3-4 times a day (for example, sit in the bathroom with the shower running).  Apply a warm, moist washcloth to your face 3-4 times a day, or as directed by your health care provider.  Use saline nasal sprays to help moisten and clean your sinuses.  Take medicines only as directed by your health care provider.  If you were prescribed either an antibiotic or antifungal medicine, finish it all even if you start to feel better. SEEK IMMEDIATE MEDICAL CARE IF:  You have increasing pain or severe headaches.  You have nausea, vomiting, or drowsiness.  You have swelling around your face.  You have vision problems.  You have a stiff neck.  You have difficulty breathing.   This information is not intended to replace advice given to you by your health care provider. Make sure you discuss any questions you have with your health care provider.   Document Released: 09/27/2005 Document Revised: 10/18/2014 Document Reviewed: 10/12/2011 Elsevier Interactive Patient Education [employer].

## 2015-09-17 NOTE — Progress Notes (Signed)
Pre visit review using our clinic review tool, if applicable. No additional management support is needed unless otherwise documented below in the visit note. 

## 2015-09-17 NOTE — Progress Notes (Signed)
Subjective:    Patient ID: Erica [name], female    DOB: 1956-04-08, 59 y.o.   MRN: [id number]  HPI  She has cold symptoms. Her symptoms started 4 days ago.  She states nasal congestion, sinus pain, chills, headaches, cough and wheeze.  Her cough is productive at times.  She denies fever.  Her grandson has RSV.    Prediabetes: She is not watching her diet.  She is not currently exercise.    Hypertension: She is taking her medication daily. She is compliant with a low sodium diet.  She denies chest pain, palpitations, edema, shortness of breath (except with recent cold) and regular headaches. She is not exercising regularly.  She does not monitor her blood pressure at home.    Hyperlipidemia: She is taking her medication daily. She is somewhat  compliant with a low fat/cholesterol diet. She is not exercising regularly.   GERD:  She is taking her medication daily as prescribed.  She sometiems when she wakes up in the morning she has a burning sensation in her throat.    She eats sugar to self medicate to some degree .  She has a lot of stress at this time of year related to deaths in the family, etc.  She does follow with a psychiatrist.    Medications and allergies reviewed with patient and not updated.  Patient Active Problem List   Diagnosis Date Noted  . Hyperglycemia 03/12/2015  . Orthostatic hypotension 09/20/2013  . Abnormal ECG 09/11/2012  . Palpitations 09/08/2012  . Sciatica of left side   . Peripheral edema 05/09/2012  . FATIGUE 02/23/2010  . Obstructive sleep apnea 10/31/2009  . Morbid obesity ([place]) 10/17/2009  . HEMORRHOIDS, EXTERNAL 10/17/2009  . CONSTIPATION, CHRONIC 10/17/2009  . URTICARIA 03/11/2009  . Hyperlipidemia 01/12/2008  . Depression, major, recurrent ([place]) 01/12/2008  . Essential hypertension, benign 01/12/2008  . Allergic rhinitis 01/12/2008    Current Outpatient Prescriptions on File Prior to Visit  Medication Sig Dispense Refill  . atenolol  (TENORMIN) 50 MG tablet Take 1 tablet (50 mg total) by mouth daily. 90 tablet 3  . clonazePAM (KLONOPIN) 1 MG tablet Take 1 mg by mouth 2 (two) times daily.    . fluticasone (FLONASE) 50 MCG/ACT nasal spray New Sig: Spray once into each nostril up to twice a day as needed for . 16 g 2  . furosemide (LASIX) 40 MG tablet Take 1 tablet (40 mg total) by mouth 2 (two) times daily. 180 tablet 3  . hydrocortisone (ANUSOL-HC) 2.5 % rectal cream Place rectally as needed. 30 g 3  . lactulose (CHRONULAC) 10 GM/15ML solution Take 15 mLs (10 g total) by mouth daily. 240 mL 1  . Linaclotide (LINZESS) 145 MCG CAPS capsule Take 1 capsule (145 mcg total) by mouth daily. 90 capsule 3  . Loratadine (CLARITIN PO) Take by mouth.    . meclizine (ANTIVERT) 25 MG tablet Take 1 tablet (25 mg total) by mouth 3 (three) times daily as needed for dizziness or nausea. 180 tablet 3  . omeprazole (PRILOSEC) 20 MG capsule Take 1 capsule (20 mg total) by mouth daily. 90 capsule 3  . PARoxetine (PAXIL-CR) 25 MG 24 hr tablet Take 25 mg by mouth daily.    . potassium chloride SA (K-DUR,KLOR-CON) 20 MEQ tablet Take 1 tablet (20 mEq total) by mouth 2 (two) times daily. 180 tablet 3  . simvastatin (ZOCOR) 40 MG tablet Take 1 tablet (40 mg total) by mouth daily at  6 PM. 90 tablet 3  . tizanidine (ZANAFLEX) 2 MG capsule Take 1 capsule (2 mg total) by mouth 3 (three) times daily as needed for muscle spasms. 270 capsule 1  . [DISCONTINUED] oxycodone (OXY-IR) 5 MG capsule Take 5 mg by mouth every 4 (four) hours as needed. For pain.     No current facility-administered medications on file prior to visit.    Past Medical History  Diagnosis Date  . Hypertension   . ALLERGIC RHINITIS   . Sleep apnea     CPAP hs  . PAF (paroxysmal atrial fibrillation) ([place])     pt denies  . Dyslipidemia   . Panic attacks     Hx of depression  . Osteoarthritis   . Obesity   . Depression   . External hemorrhoids   . GERD (gastroesophageal reflux  disease)   . Allergy   . Chronic kidney disease   . DDD (degenerative disc disease), lumbar     ESI with Ramos (spring 2016)    Past Surgical History  Procedure Laterality Date  . Replacement total knee Right   . Abdominal hysterectomy    . Right oophorectomy      No cancer    Social History   Social History  . Marital Status: Married    Spouse Name: N/A  . Number of Children: 3  . Years of Education: N/A   Occupational History  . disabled    Social History Main Topics  . Smoking status: Never Smoker   . Smokeless tobacco: Never Used  . Alcohol Use: 0.6 oz/week    1 Glasses of wine per week     Comment: one drink a month  . Drug Use: No  . Sexual Activity: Not Currently   Other Topics Concern  . Not on file   Social History Narrative   Married with children. Pt is on disablity. Previous worked in the school system    Review of Systems  Constitutional: Positive for chills. Negative for fever.  HENT: Positive for congestion, ear pain and sinus pressure. Negative for sore throat.   Respiratory: Positive for cough, shortness of breath (at night) and wheezing.   Cardiovascular: Negative for chest pain, palpitations and leg swelling.  Musculoskeletal: Positive for myalgias.  Neurological: Positive for light-headedness and headaches. Negative for dizziness.       Objective:   Filed Vitals:   09/17/15 1027  BP: 124/80  Pulse: 65  Temp: 98.3 F (36.8 C)  Resp: 18   Filed Weights   09/17/15 1027  Weight: 258 lb (117.028 kg)   Body mass index is 41.02 kg/(m^2).   Physical Exam Constitutional: Appears well-developed and well-nourished. No distress.  Neck: Neck supple. No tracheal deviation present. No thyromegaly present. Oropharynx with mild erythema, ear canal and TMs normal, nasal congestion, No carotid bruit. No cervical adenopathy.   Cardiovascular: Normal rate, regular rhythm and normal heart sounds.   No murmur heard. Pulmonary/Chest: Effort normal  and breath sounds normal. No respiratory distress. No wheezes.  Musculoskeletal: No edema.        Assessment & Plan:   Sinus infection Likely viral  Discussed otc medications to try Increase rest, fluids Call if no improvement  See Problem List.  Follow up in 6 months for wellness and follow up

## 2015-09-17 NOTE — Assessment & Plan Note (Signed)
Well controlled Continue current medications Stressed lifestyle changes - exercise and weight loss Check cmp

## 2015-09-17 NOTE — Assessment & Plan Note (Signed)
Check lipid panel Stressed exercise and weight loss

## 2015-09-17 NOTE — Assessment & Plan Note (Signed)
a1c in prediabetic range in past stressed regular exercise and weight loss Follow up every 6 months

## 2015-09-18 DIAGNOSIS — F331 Major depressive disorder, recurrent, moderate: Secondary | ICD-10-CM | POA: Diagnosis not present

## 2015-09-18 DIAGNOSIS — F410 Panic disorder [episodic paroxysmal anxiety] without agoraphobia: Secondary | ICD-10-CM | POA: Diagnosis not present

## 2015-09-18 DIAGNOSIS — F411 Generalized anxiety disorder: Secondary | ICD-10-CM | POA: Diagnosis not present

## 2015-09-18 LAB — HEPATITIS C ANTIBODY: HCV Ab: NEGATIVE

## 2015-09-21 ENCOUNTER — Encounter: Payer: Self-pay | Admitting: Internal Medicine

## 2015-09-21 DIAGNOSIS — E875 Hyperkalemia: Secondary | ICD-10-CM | POA: Insufficient documentation

## 2015-09-21 DIAGNOSIS — R7303 Prediabetes: Secondary | ICD-10-CM | POA: Insufficient documentation

## 2015-09-22 DIAGNOSIS — M7581 Other shoulder lesions, right shoulder: Secondary | ICD-10-CM | POA: Diagnosis not present

## 2015-09-22 DIAGNOSIS — M65811 Other synovitis and tenosynovitis, right shoulder: Secondary | ICD-10-CM | POA: Diagnosis not present

## 2015-09-22 DIAGNOSIS — M7551 Bursitis of right shoulder: Secondary | ICD-10-CM | POA: Diagnosis not present

## 2015-09-22 DIAGNOSIS — M19011 Primary osteoarthritis, right shoulder: Secondary | ICD-10-CM | POA: Diagnosis not present

## 2015-09-22 DIAGNOSIS — M25411 Effusion, right shoulder: Secondary | ICD-10-CM | POA: Diagnosis not present

## 2015-09-30 DIAGNOSIS — M25511 Pain in right shoulder: Secondary | ICD-10-CM | POA: Diagnosis not present

## 2015-09-30 DIAGNOSIS — M75111 Incomplete rotator cuff tear or rupture of right shoulder, not specified as traumatic: Secondary | ICD-10-CM | POA: Diagnosis not present

## 2015-09-30 DIAGNOSIS — M7581 Other shoulder lesions, right shoulder: Secondary | ICD-10-CM | POA: Diagnosis not present

## 2015-10-03 ENCOUNTER — Ambulatory Visit (INDEPENDENT_AMBULATORY_CARE_PROVIDER_SITE_OTHER): Payer: Medicare Other | Admitting: Internal Medicine

## 2015-10-03 ENCOUNTER — Encounter: Payer: Self-pay | Admitting: Internal Medicine

## 2015-10-03 ENCOUNTER — Other Ambulatory Visit: Payer: Self-pay

## 2015-10-03 ENCOUNTER — Ambulatory Visit (INDEPENDENT_AMBULATORY_CARE_PROVIDER_SITE_OTHER)
Admission: RE | Admit: 2015-10-03 | Discharge: 2015-10-03 | Disposition: A | Discharge status: Home / Self Care | Payer: Medicare Other | Source: Ambulatory Visit | Attending: Internal Medicine | Admitting: Internal Medicine

## 2015-10-03 VITALS — BP 130/70 | HR 76 | Temp 98.3°F | Resp 16 | Ht 66.5 in

## 2015-10-03 DIAGNOSIS — R51 Headache: Secondary | ICD-10-CM | POA: Diagnosis not present

## 2015-10-03 DIAGNOSIS — G43001 Migraine without aura, not intractable, with status migrainosus: Secondary | ICD-10-CM | POA: Insufficient documentation

## 2015-10-03 DIAGNOSIS — R200 Anesthesia of skin: Secondary | ICD-10-CM | POA: Insufficient documentation

## 2015-10-03 DIAGNOSIS — R208 Other disturbances of skin sensation: Secondary | ICD-10-CM

## 2015-10-03 DIAGNOSIS — R519 Headache, unspecified: Secondary | ICD-10-CM

## 2015-10-03 DIAGNOSIS — D329 Benign neoplasm of meninges, unspecified: Secondary | ICD-10-CM | POA: Diagnosis not present

## 2015-10-03 MED ORDER — SUMATRIPTAN-NAPROXEN SODIUM 85-500 MG PO TABS: 1.0000 | ORAL_TABLET | ORAL | Status: DC | PRN

## 2015-10-03 NOTE — Progress Notes (Signed)
Subjective:  Patient ID: Erica Erica [name], female    DOB: [birth year]  Age: 59 y.o. MRN: [id number]  CC: Headache   HPI Erica Erica [name] presents for evaluation of right sided headache for about 3 days. She describes a stabbing, shooting pain over her right upper face and right parietal scalp. The pain radiates into the right back side of her head. She took a dose of Vicodin without any relief. When the headache started she had a brief episode of numbness and tingling in her left hand but that has resolved. She complains of watering from her right eye.  History Erica Erica [name] has a past medical history of Hypertension; ALLERGIC RHINITIS; Sleep apnea; PAF (paroxysmal atrial fibrillation) ([place]); Dyslipidemia; Panic attacks; Osteoarthritis; Obesity; Depression; External hemorrhoids; GERD (gastroesophageal reflux disease); Allergy; Chronic kidney disease; and DDD (degenerative disc disease), lumbar.   She has past surgical history that includes Replacement total knee (Right); Abdominal hysterectomy; and Right oophorectomy.   Her family history includes Allergies in her sister; Asthma in her other and son; Breast cancer in her maternal aunt; Colon cancer in her cousin; Dementia in her father; Diabetes in her mother and son; Heart disease in her mother and son; Kidney failure in her father; Prostate cancer in her father and maternal uncle; Thyroid disease in her mother.She reports that she has never smoked. She has never used smokeless tobacco. She reports that she drinks about 0.6 oz of alcohol per week. She reports that she does not use illicit drugs.  Outpatient Prescriptions Prior to Visit  Medication Sig Dispense Refill  . atenolol (TENORMIN) 50 MG tablet Take 1 tablet (50 mg total) by mouth daily. 90 tablet 3  . clonazePAM (KLONOPIN) 1 MG tablet Take 1 mg by mouth 2 (two) times daily.    . fluticasone (FLONASE) 50 MCG/ACT nasal spray New Sig: Spray once into each nostril up to twice a day as needed  for . 16 g 2  . furosemide (LASIX) 40 MG tablet Take 1 tablet (40 mg total) by mouth 2 (two) times daily. 180 tablet 3  . hydrocortisone (ANUSOL-HC) 2.5 % rectal cream Place rectally as needed. 30 g 3  . lactulose (CHRONULAC) 10 GM/15ML solution Take 15 mLs (10 g total) by mouth daily. 240 mL 1  . Linaclotide (LINZESS) 145 MCG CAPS capsule Take 1 capsule (145 mcg total) by mouth daily. 90 capsule 3  . Loratadine (CLARITIN PO) Take by mouth.    . meclizine (ANTIVERT) 25 MG tablet Take 1 tablet (25 mg total) by mouth 3 (three) times daily as needed for dizziness or nausea. 180 tablet 3  . omeprazole (PRILOSEC) 20 MG capsule Take 1 capsule (20 mg total) by mouth daily. 90 capsule 3  . PARoxetine (PAXIL-CR) 25 MG 24 hr tablet Take 25 mg by mouth daily.    . potassium chloride SA (K-DUR,KLOR-CON) 20 MEQ tablet Take 1 tablet (20 mEq total) by mouth 2 (two) times daily. 180 tablet 3  . simvastatin (ZOCOR) 40 MG tablet Take 1 tablet (40 mg total) by mouth daily at 6 PM. 90 tablet 3  . tizanidine (ZANAFLEX) 2 MG capsule Take 1 capsule (2 mg total) by mouth 3 (three) times daily as needed for muscle spasms. 270 capsule 1   No facility-administered medications prior to visit.    ROS Review of Systems  Constitutional: Negative.  Negative for fever, chills, diaphoresis, appetite change and fatigue.  HENT: Negative for sore throat and trouble swallowing.   Eyes: Negative.  Negative  for photophobia, redness and visual disturbance.  Respiratory: Negative.  Negative for cough, choking, chest tightness, shortness of breath and stridor.   Cardiovascular: Negative.  Negative for chest pain, palpitations and leg swelling.  Gastrointestinal: Negative.  Negative for nausea, vomiting, abdominal pain and diarrhea.  Endocrine: Negative.   Genitourinary: Negative.   Musculoskeletal: Negative.  Negative for myalgias, back pain, neck pain and neck stiffness.  Skin: Negative.  Negative for color change and rash.    Neurological: Positive for numbness and headaches. Negative for dizziness, tremors, seizures, syncope, facial asymmetry, speech difficulty, weakness and light-headedness.  Hematological: Negative.   Psychiatric/Behavioral: Negative.     Objective:  BP 130/70 mmHg  Pulse 76  Temp(Src) 98.3 F (36.8 C) (Oral)  Resp 16  Ht 5' 6.5" (1.689 m)  Wt   SpO2 95%  Physical Exam  Constitutional: She is oriented to person, place, and time. She appears well-developed and well-nourished.  Non-toxic appearance. She does not have a sickly appearance. She does not appear ill. No distress.  HENT:  Head: Normocephalic and atraumatic.  Mouth/Throat: Oropharynx is clear and moist. No oropharyngeal exudate.  Eyes: Conjunctivae and EOM are normal. Pupils are equal, round, and reactive to light. Right eye exhibits no discharge. Left eye exhibits no discharge. No scleral icterus.  Neck: Normal range of motion. Neck supple. No JVD present. No tracheal deviation present. No thyromegaly present.  Cardiovascular: Normal rate, regular rhythm, normal heart sounds and intact distal pulses.  Exam reveals no gallop and no friction rub.   No murmur heard. Pulmonary/Chest: Effort normal and breath sounds normal. No stridor. No respiratory distress. She has no wheezes. She has no rales. She exhibits no tenderness.  Abdominal: Soft. Bowel sounds are normal. She exhibits no distension and no mass. There is no tenderness. There is no rebound and no guarding.  Musculoskeletal: Normal range of motion. She exhibits no edema or tenderness.  Lymphadenopathy:    She has no cervical adenopathy.  Neurological: She is Erica [name] and oriented to person, place, and time. She displays normal reflexes. No cranial nerve deficit or sensory deficit. She exhibits normal muscle tone. She displays no seizure activity. Coordination and gait normal. She displays no Babinski's sign on the right side. She displays no Babinski's sign on the left side.   Reflex Scores:      Tricep reflexes are 0 on the right side and 0 on the left side.      Bicep reflexes are 0 on the right side.      Brachioradialis reflexes are 0 on the right side and 0 on the left side.      Patellar reflexes are 0 on the right side and 0 on the left side.      Achilles reflexes are 0 on the right side and 0 on the left side. Skin: Skin is warm and dry. No rash noted. She is not diaphoretic. No erythema. No pallor.  Psychiatric: She has a normal mood and affect. Her behavior is normal. Judgment and thought content normal.  Vitals reviewed.   Lab Results  Component Value Date   WBC 7.1 04/22/2014   HGB 11.8* 04/22/2014   HCT 36.0 04/22/2014   PLT 223.0 04/22/2014   GLUCOSE 103* 09/17/2015   CHOL 218* 09/17/2015   TRIG 93.0 09/17/2015   HDL 56.10 09/17/2015   LDLCALC 143* 09/17/2015   ALT 10 09/17/2015   AST 13 09/17/2015   NA 146* 09/17/2015   K 5.6* 09/17/2015   CL 107  09/17/2015   CREATININE 0.81 09/17/2015   BUN 17 09/17/2015   CO2 32 09/17/2015   TSH 2.38 09/17/2015   HGBA1C 6.3 09/17/2015    Assessment & Plan:   Erica Erica [name] was seen today for headache.  Diagnoses and all orders for this visit:  Migraine without aura and with status migrainosus, not intractable- I gave her samples of Treximet, I spoke to her later in the day and she told me she took a dose and that her headache had resolved. -     Discontinue: SUMAtriptan-naproxen (TREXIMET) 85-500 MG tablet; Take 1 tablet by mouth every 2 (two) hours as needed for migraine.  Numbness of left hand- her exam is normal now. -     CT Head Wo Contrast; Future  Headache, unspecified headache type- she underwent a CT scan today which only showed an 8 mm right meningioma that is reported by the radiologist as being inconsequential. The remainder of her CT scan of the head was normal. She was informed of these results, she was informed that this is a benign lesion that is not causing her headaches or any  other symptoms. She was reassured with this information. -     CT Head Wo Contrast; Future   I am having Erica Erica [name] maintain her PARoxetine, lactulose, clonazePAM, hydrocortisone, meclizine, potassium chloride SA, furosemide, omeprazole, simvastatin, tizanidine, fluticasone, atenolol, Linaclotide, and Loratadine (CLARITIN PO).  Meds ordered this encounter  Medications  . DISCONTD: SUMAtriptan-naproxen (TREXIMET) 85-500 MG tablet    Sig: Take 1 tablet by mouth every 2 (two) hours as needed for migraine.    Dispense:  10 tablet    Refill:  2     Follow-up: Return in about 3 weeks (around 10/24/2015).  Scarlette Calico, MD

## 2015-10-03 NOTE — Progress Notes (Signed)
Pre visit review using our clinic review tool, if applicable. No additional management support is needed unless otherwise documented below in the visit note. 

## 2015-10-03 NOTE — Patient Instructions (Signed)

## 2015-10-03 NOTE — Telephone Encounter (Signed)
Pt is in for an acute visit with Dr. Ronnald Ramp. Pt is asking when she should go back to lab. Her my chart is not working. I will try to get that going.

## 2015-10-06 NOTE — Telephone Encounter (Signed)
Call her - she can go anytime - lab has already been ordered.

## 2015-10-17 DIAGNOSIS — J342 Deviated nasal septum: Secondary | ICD-10-CM | POA: Diagnosis not present

## 2015-10-17 DIAGNOSIS — J343 Hypertrophy of nasal turbinates: Secondary | ICD-10-CM | POA: Diagnosis not present

## 2015-10-17 DIAGNOSIS — J301 Allergic rhinitis due to pollen: Secondary | ICD-10-CM | POA: Diagnosis not present

## 2015-11-11 DIAGNOSIS — F411 Generalized anxiety disorder: Secondary | ICD-10-CM | POA: Diagnosis not present

## 2015-11-11 DIAGNOSIS — F331 Major depressive disorder, recurrent, moderate: Secondary | ICD-10-CM | POA: Diagnosis not present

## 2015-11-11 DIAGNOSIS — F410 Panic disorder [episodic paroxysmal anxiety] without agoraphobia: Secondary | ICD-10-CM | POA: Diagnosis not present

## 2015-11-28 ENCOUNTER — Ambulatory Visit (INDEPENDENT_AMBULATORY_CARE_PROVIDER_SITE_OTHER): Payer: Medicare Other | Admitting: Internal Medicine

## 2015-11-28 ENCOUNTER — Encounter: Payer: Self-pay | Admitting: Internal Medicine

## 2015-11-28 VITALS — BP 132/80 | HR 76 | Temp 98.7°F | Resp 20 | Wt 263.0 lb

## 2015-11-28 DIAGNOSIS — I10 Essential (primary) hypertension: Secondary | ICD-10-CM | POA: Diagnosis not present

## 2015-11-28 DIAGNOSIS — M79605 Pain in left leg: Secondary | ICD-10-CM | POA: Insufficient documentation

## 2015-11-28 DIAGNOSIS — R229 Localized swelling, mass and lump, unspecified: Secondary | ICD-10-CM | POA: Diagnosis not present

## 2015-11-28 MED ORDER — TRAMADOL HCL 50 MG PO TABS: 50.0000 mg | ORAL_TABLET | Freq: Three times a day (TID) | ORAL | Status: DC | PRN

## 2015-11-28 NOTE — Assessment & Plan Note (Signed)
Mild to mod, for pain control,  to f/u any worsening symptoms or concerns 

## 2015-11-28 NOTE — Assessment & Plan Note (Signed)
Etiology unclear, diff includes small hematoma, superfic phlebitis or other skin related nodule, likely benign, but new onset and pt very concerned, asks for gen surgury referral

## 2015-11-28 NOTE — Progress Notes (Signed)
Pre visit review using our clinic review tool, if applicable. No additional management support is needed unless otherwise documented below in the visit note. 

## 2015-11-28 NOTE — Progress Notes (Signed)
Subjective:    Patient ID: Erica [name], female    DOB: November 25, 1955, 60 y.o.   MRN: [id number]  HPI  Here to f/u with 1 wk onset tender lump under skin to distal left mid pretibial with mild sharp pain to palpate, none to walk, nothing else makes better or worse.  Has some trace swelling to ankle and foot as well that may be chronic? And not really better or worse with elevation. No trauma she can recall, no fever or hx of gout or phlebitis.  Concerned about DVT, and pain wakes her up at night.   Pt denies fever, wt loss, night sweats, loss of appetite, or other constitutional symptoms  Pt denies new neurological symptoms such as new headache, or facial or extremity weakness or numbness   Pt denies polydipsia, polyuria Past Medical History  Diagnosis Date  . Hypertension   . ALLERGIC RHINITIS   . Sleep apnea     CPAP hs  . PAF (paroxysmal atrial fibrillation) ([place])     pt denies  . Dyslipidemia   . Panic attacks     Hx of depression  . Osteoarthritis   . Obesity   . Depression   . External hemorrhoids   . GERD (gastroesophageal reflux disease)   . Allergy   . Chronic kidney disease   . DDD (degenerative disc disease), lumbar     ESI with [facility] (spring 2016)   Past Surgical History  Procedure Laterality Date  . Replacement total knee Right   . Abdominal hysterectomy    . Right oophorectomy      No cancer    reports that she has never smoked. She has never used smokeless tobacco. She reports that she drinks about 0.6 oz of alcohol per week. She reports that she does not use illicit drugs. family history includes Allergies in her sister; Asthma in her other and son; Breast cancer in her maternal aunt; Colon cancer in her cousin; Dementia in her father; Diabetes in her mother and son; Heart disease in her mother and son; Kidney failure in her father; Prostate cancer in her father and maternal uncle; Thyroid disease in her mother. Allergies  Allergen Reactions  . Codeine Nausea  And Vomiting  . Guaifenesin-Codeine     REACTION: feels spacey  . Wellbutrin [Bupropion] Palpitations    hallucinations   Current Outpatient Prescriptions on File Prior to Visit  Medication Sig Dispense Refill  . atenolol (TENORMIN) 50 MG tablet Take 1 tablet (50 mg total) by mouth daily. 90 tablet 3  . clonazePAM (KLONOPIN) 1 MG tablet Take 1 mg by mouth 2 (two) times daily.    . fluticasone (FLONASE) 50 MCG/ACT nasal spray New Sig: Spray once into each nostril up to twice a day as needed for . 16 g 2  . furosemide (LASIX) 40 MG tablet Take 1 tablet (40 mg total) by mouth 2 (two) times daily. 180 tablet 3  . hydrocortisone (ANUSOL-HC) 2.5 % rectal cream Place rectally as needed. 30 g 3  . lactulose (CHRONULAC) 10 GM/15ML solution Take 15 mLs (10 g total) by mouth daily. 240 mL 1  . Linaclotide (LINZESS) 145 MCG CAPS capsule Take 1 capsule (145 mcg total) by mouth daily. 90 capsule 3  . Loratadine (CLARITIN PO) Take by mouth.    . meclizine (ANTIVERT) 25 MG tablet Take 1 tablet (25 mg total) by mouth 3 (three) times daily as needed for dizziness or nausea. 180 tablet 3  . omeprazole (PRILOSEC)  20 MG capsule Take 1 capsule (20 mg total) by mouth daily. 90 capsule 3  . PARoxetine (PAXIL-CR) 25 MG 24 hr tablet Take 25 mg by mouth daily.    . potassium chloride SA (K-DUR,KLOR-CON) 20 MEQ tablet Take 1 tablet (20 mEq total) by mouth 2 (two) times daily. 180 tablet 3  . simvastatin (ZOCOR) 40 MG tablet Take 1 tablet (40 mg total) by mouth daily at 6 PM. 90 tablet 3  . SUMAtriptan-naproxen (TREXIMET) 85-500 MG tablet Take 1 tablet by mouth every 2 (two) hours as needed for migraine. 10 tablet 2  . tizanidine (ZANAFLEX) 2 MG capsule Take 1 capsule (2 mg total) by mouth 3 (three) times daily as needed for muscle spasms. 270 capsule 1  . [DISCONTINUED] oxycodone (OXY-IR) 5 MG capsule Take 5 mg by mouth every 4 (four) hours as needed. For pain.     No current facility-administered medications on file  prior to visit.   Review of Systems  Constitutional: Negative for unusual diaphoresis or night sweats HENT: Negative for ringing in ear or discharge Eyes: Negative for double vision or worsening visual disturbance.  Respiratory: Negative for choking and stridor.   Gastrointestinal: Negative for vomiting or other signifcant bowel change Genitourinary: Negative for hematuria or change in urine volume.  Musculoskeletal: Negative for other MSK pain or swelling Skin: Negative for color change and worsening wound.  Neurological: Negative for tremors and numbness other than noted  Psychiatric/Behavioral: Negative for decreased concentration or agitation other than above       Objective:   Physical Exam BP 132/80 mmHg  Pulse 76  Temp(Src) 98.7 F (37.1 C) (Oral)  Resp 20  Wt 263 lb (119.296 kg)  SpO2 95% VS noted, not ill appaering Constitutional: Pt appears in no significant distress HENT: Head: NCAT.  Right Ear: External ear normal.  Left Ear: External ear normal.  Eyes: . Pupils are equal, round, and reactive to light. Conjunctivae and EOM are normal Neck: Normal range of motion. Neck supple.  Cardiovascular: Normal rate and regular rhythm.   Pulmonary/Chest: Effort normal and breath sounds without rales or wheezing.  Neurological: Pt is [name]. Not confused , motor grossly intact Skin: Skin is warm. No rash, no LE edema except for very trace LLE ankle/foot edema, has a mid left pretibial subq tender nodule like mass, firm, mobile, no overlying skin change, bruise or ulcer Psychiatric: Pt behavior is normal. No agitation.     Assessment & Plan:

## 2015-11-28 NOTE — Patient Instructions (Signed)
Please take all new medication as prescribed - the pain medication as needed  You will be contacted regarding the referral for: General Surgury  OK to cancel the appointment if your leg improves in the meantime  Please continue all other medications as before, and refills have been done if requested.  Please have the pharmacy call with any other refills you may need.  Please keep your appointments with your specialists as you may have planned

## 2015-11-28 NOTE — Assessment & Plan Note (Signed)
stable overall by history and exam, recent data reviewed with pt, and pt to continue medical treatment as before,  to f/u any worsening symptoms or concerns BP Readings from Last 3 Encounters:  11/28/15 132/80  10/03/15 130/70  09/17/15 124/80

## 2015-12-16 ENCOUNTER — Ambulatory Visit: Payer: Self-pay | Admitting: Surgery

## 2015-12-16 DIAGNOSIS — R229 Localized swelling, mass and lump, unspecified: Secondary | ICD-10-CM | POA: Diagnosis not present

## 2015-12-16 NOTE — H&P (Signed)
History of Present Illness Erica [name]. Erica Cadet MD; 12/16/2015 11:56 AM) Patient words: cyst left leg.  The patient is a 60 year old female who presents with a complaint of Mass. Referred by Dr. Cathlean Cower for left leg mass  This is a 60 year old female who presents with several months of a tender palpable mass on her anterior left leg in the pretibial area. She states that this area was red but currently the skin overlying it appears normal. This area is fairly tender to palpation. There has been no drainage from this area. There is no imaging of this area.   Other Problems [name], [name]; 12/16/2015 9:28 AM) Anxiety Disorder Back Pain Gastroesophageal Reflux Disease High blood pressure Hypercholesterolemia Migraine Headache Oophorectomy Right. Sleep Apnea  Past Surgical History [name], [name]; 12/16/2015 9:28 AM) Hysterectomy (not due to cancer) - Partial Knee Surgery Right.  Diagnostic Studies History [name], [name]; 12/16/2015 9:28 AM) Mammogram 1-3 years ago  Allergies [name], [name]; 12/16/2015 9:30 AM) Codeine Phosphate *ANALGESICS - OPIOID* Wellbutrin *ANTIDEPRESSANTS* GuaiFENesin *COUGH/COLD/ALLERGY*  Medication History ([name], [name]; 12/16/2015 9:30 AM) Atenolol (50MG  Tablet, Oral) Active. ClonazePAM (1MG  Tablet, Oral) Active. Flonase (50MCG/ACT Suspension, Nasal) Active. Lasix (40MG  Tablet, Oral) Active. Anusol-HC (2.5% Cream, Rectal) Active. Lactulose (10GM/15ML Solution, Oral) Active. Linzess (145MCG Capsule, Oral) Active. Claritin (10MG  Tablet, Oral) Active. Antivert (12.5MG  Tablet, Oral) Active. PriLOSEC (20MG  Capsule DR, Oral) Active. Paxil CR (25MG  Tablet ER 24HR, Oral) Active. Potassium Chloride Crys ER (20MEQ Tablet ER, Oral) Active. Simvastatin (40MG  Tablet, Oral) Active. Treximet (85-500MG  Tablet, Oral) Active. Zanaflex (2MG  Capsule, Oral) Active. TraMADol HCl (50MG  Tablet, Oral) Active. Medications  Reconciled  Social History [name], [name]; 12/16/2015 9:28 AM) Alcohol use Moderate alcohol use. Caffeine use Carbonated beverages, Coffee, Tea.  Family History [name], [name]; 12/16/2015 9:28 AM) Alcohol Abuse Brother, Daughter, Father. Arthritis Mother. Breast Cancer Family Members In General. Cancer Brother. Depression Family Members In General. Heart Disease Mother. Hypertension Mother, Son. Migraine Headache Family Members In General.  Pregnancy / Birth History [name], [name]; 12/16/2015 9:28 AM) Age at menarche 24 years. Age of menopause <43 Gravida 3 Maternal age 18-20 Para 3     Review of Systems ([name]; 12/16/2015 9:28 AM) General Present- Fatigue and Weight Loss. Not Present- Appetite Loss, Chills, Fever, Night Sweats and Weight Gain. HEENT Present- Sinus Pain. Not Present- Earache, Hearing Loss, Hoarseness, Nose Bleed, Oral Ulcers, Ringing in the Ears, Seasonal Allergies, Sore Throat, Visual Disturbances, Wears glasses/contact lenses and Yellow Eyes. Respiratory Present- Snoring. Not Present- Bloody sputum, Chronic Cough, Difficulty Breathing and Wheezing. Cardiovascular Present- Palpitations. Not Present- Chest Pain, Difficulty Breathing Lying Down, Leg Cramps, Rapid Heart Rate, Shortness of Breath and Swelling of Extremities. Gastrointestinal Present- Constipation and Hemorrhoids. Not Present- Abdominal Pain, Bloating, Bloody Stool, Change in Bowel Habits, Chronic diarrhea, Difficulty Swallowing, Excessive gas, Gets full quickly at meals, Indigestion, Nausea, Rectal Pain and Vomiting. Musculoskeletal Present- Back Pain, Joint Pain and Joint Stiffness. Not Present- Muscle Pain, Muscle Weakness and Swelling of Extremities. Neurological Present- Headaches. Not Present- Decreased Memory, Fainting, Numbness, Seizures, Tingling, Tremor, Trouble walking and Weakness. Psychiatric Present- Anxiety, Depression and Frequent crying. Not Present- Bipolar,  Change in Sleep Pattern and Fearful.  Vitals ([name] [name]; 12/16/2015 9:29 AM) 12/16/2015 9:29 AM Weight: 263 lb Height: 67in Body Surface Area: 2.27 m Body Mass Index: 41.19 kg/m  Temp.: 97.23F(Temporal)  Pulse: 79 (Regular)  BP: 130/84 (Sitting, Left Arm, Standard)      Physical Exam [name]; 12/16/2015 11:56 AM)  The physical exam findings are as follows: Note:WDWN in nAD Left anterior leg - 1.5 cm palpable firm subcutaneous mass; mobile; no overlying skin changes    Assessment & Plan [name]; 12/16/2015 9:42 AM)  SUBCUTANEOUS NODULE (R22.9) Impression: 2 cm - anterior left tibial region  Current Plans Schedule for Surgery - Excision of subcutaneous nodule - left leg. The surgical procedure has been discussed with the patient. Potential risks, benefits, alternative treatments, and expected outcomes have been explained. All of the patient's questions at this time have been answered. The likelihood of reaching the patient's treatment goal is good. The patient understand the proposed surgical procedure and wishes to proceed.  Erica [name]. Georgette Dover, MD, [facility] Surgery  General/ Trauma Surgery  12/16/2015 11:58 AM

## 2015-12-25 ENCOUNTER — Encounter (HOSPITAL_BASED_OUTPATIENT_CLINIC_OR_DEPARTMENT_OTHER): Payer: Self-pay | Admitting: *Deleted

## 2015-12-26 ENCOUNTER — Encounter (HOSPITAL_BASED_OUTPATIENT_CLINIC_OR_DEPARTMENT_OTHER)
Admission: RE | Admit: 2015-12-26 | Discharge: 2015-12-26 | Disposition: A | Discharge status: Home / Self Care | Payer: Medicare Other | Source: Ambulatory Visit | Attending: Surgery | Admitting: Surgery

## 2015-12-26 DIAGNOSIS — Z01812 Encounter for preprocedural laboratory examination: Secondary | ICD-10-CM | POA: Insufficient documentation

## 2015-12-26 LAB — BASIC METABOLIC PANEL
Anion gap: 11 (ref 5–15)
BUN: 16 mg/dL (ref 6–20)
CO2: 28 mmol/L (ref 22–32)
Calcium: 9.5 mg/dL (ref 8.9–10.3)
Chloride: 104 mmol/L (ref 101–111)
Creatinine, Ser: 0.81 mg/dL (ref 0.44–1.00)
GFR calc Af Amer: >60 mL/min (ref >60)
GFR calc non Af Amer: >60 mL/min (ref >60)
Glucose, Bld: 127 mg/dL — ABNORMAL HIGH (ref 65–99)
Potassium: 4 mmol/L (ref 3.5–5.1)
Sodium: 143 mmol/L (ref 135–145)

## 2015-12-30 ENCOUNTER — Ambulatory Visit (HOSPITAL_BASED_OUTPATIENT_CLINIC_OR_DEPARTMENT_OTHER)
Admission: RE | Admit: 2015-12-30 | Discharge: 2015-12-30 | Disposition: A | Discharge status: Home / Self Care | Payer: Medicare Other | Source: Ambulatory Visit | Attending: Surgery | Admitting: Surgery

## 2015-12-30 ENCOUNTER — Ambulatory Visit (HOSPITAL_BASED_OUTPATIENT_CLINIC_OR_DEPARTMENT_OTHER): Payer: Medicare Other | Admitting: Anesthesiology

## 2015-12-30 ENCOUNTER — Encounter (HOSPITAL_BASED_OUTPATIENT_CLINIC_OR_DEPARTMENT_OTHER)
Admission: RE | Disposition: A | Discharge status: Home / Self Care | Payer: Self-pay | Source: Ambulatory Visit | Attending: Surgery

## 2015-12-30 ENCOUNTER — Encounter (HOSPITAL_BASED_OUTPATIENT_CLINIC_OR_DEPARTMENT_OTHER): Payer: Self-pay | Admitting: *Deleted

## 2015-12-30 DIAGNOSIS — Z6841 Body Mass Index (BMI) 40.0 and over, adult: Secondary | ICD-10-CM | POA: Insufficient documentation

## 2015-12-30 DIAGNOSIS — Z79891 Long term (current) use of opiate analgesic: Secondary | ICD-10-CM | POA: Insufficient documentation

## 2015-12-30 DIAGNOSIS — G4730 Sleep apnea, unspecified: Secondary | ICD-10-CM | POA: Insufficient documentation

## 2015-12-30 DIAGNOSIS — Z7951 Long term (current) use of inhaled steroids: Secondary | ICD-10-CM | POA: Insufficient documentation

## 2015-12-30 DIAGNOSIS — E7800 Pure hypercholesterolemia, unspecified: Secondary | ICD-10-CM | POA: Insufficient documentation

## 2015-12-30 DIAGNOSIS — K219 Gastro-esophageal reflux disease without esophagitis: Secondary | ICD-10-CM | POA: Insufficient documentation

## 2015-12-30 DIAGNOSIS — Z79899 Other long term (current) drug therapy: Secondary | ICD-10-CM | POA: Insufficient documentation

## 2015-12-30 DIAGNOSIS — L988 Other specified disorders of the skin and subcutaneous tissue: Secondary | ICD-10-CM | POA: Diagnosis not present

## 2015-12-30 DIAGNOSIS — I10 Essential (primary) hypertension: Secondary | ICD-10-CM | POA: Insufficient documentation

## 2015-12-30 DIAGNOSIS — M7989 Other specified soft tissue disorders: Secondary | ICD-10-CM | POA: Insufficient documentation

## 2015-12-30 DIAGNOSIS — R2242 Localized swelling, mass and lump, left lower limb: Secondary | ICD-10-CM | POA: Diagnosis present

## 2015-12-30 DIAGNOSIS — F419 Anxiety disorder, unspecified: Secondary | ICD-10-CM | POA: Diagnosis not present

## 2015-12-30 HISTORY — PX: MASS EXCISION: SHX2000

## 2015-12-30 SURGERY — EXCISION MASS
Anesthesia: General | Site: Leg Lower | Laterality: Left

## 2015-12-30 MED ORDER — BUPIVACAINE-EPINEPHRINE 0.25% -1:200000 IJ SOLN
INTRAMUSCULAR | Status: DC | PRN
  Administered 2015-12-30: 4 mL

## 2015-12-30 MED ORDER — GLYCOPYRROLATE 0.2 MG/ML IJ SOLN: 0.2000 mg | Freq: Once | INTRAMUSCULAR | Status: DC | PRN

## 2015-12-30 MED ORDER — SCOPOLAMINE 1 MG/3DAYS TD PT72: 1.0000 | MEDICATED_PATCH | Freq: Once | TRANSDERMAL | Status: DC | PRN

## 2015-12-30 MED ORDER — CEFAZOLIN SODIUM-DEXTROSE 2-3 GM-% IV SOLR
2.0000 g | INTRAVENOUS | Status: AC
  Administered 2015-12-30: 2 g via INTRAVENOUS

## 2015-12-30 MED ORDER — ONDANSETRON HCL 4 MG/2ML IJ SOLN
INTRAMUSCULAR | Status: DC | PRN
  Administered 2015-12-30: 4 mg via INTRAVENOUS

## 2015-12-30 MED ORDER — HYDROMORPHONE HCL 1 MG/ML IJ SOLN
INTRAMUSCULAR | Status: AC
  Filled 2015-12-30: qty 1

## 2015-12-30 MED ORDER — FENTANYL CITRATE (PF) 100 MCG/2ML IJ SOLN
50.0000 ug | INTRAMUSCULAR | Status: DC | PRN
Start: 2015-12-30 — End: 2015-12-30
  Administered 2015-12-30: 50 ug via INTRAVENOUS

## 2015-12-30 MED ORDER — LACTATED RINGERS IV SOLN
INTRAVENOUS | Status: DC
  Administered 2015-12-30: 13:00:00 via INTRAVENOUS

## 2015-12-30 MED ORDER — MIDAZOLAM HCL 2 MG/2ML IJ SOLN
1.0000 mg | INTRAMUSCULAR | Status: DC | PRN
  Administered 2015-12-30: 1 mg via INTRAVENOUS

## 2015-12-30 MED ORDER — PROPOFOL 10 MG/ML IV BOLUS
INTRAVENOUS | Status: AC
  Filled 2015-12-30: qty 20

## 2015-12-30 MED ORDER — FENTANYL CITRATE (PF) 100 MCG/2ML IJ SOLN
INTRAMUSCULAR | Status: AC
  Filled 2015-12-30: qty 2

## 2015-12-30 MED ORDER — LIDOCAINE HCL (CARDIAC) 20 MG/ML IV SOLN
INTRAVENOUS | Status: DC | PRN
  Administered 2015-12-30: 50 mg via INTRAVENOUS

## 2015-12-30 MED ORDER — OXYCODONE HCL 5 MG PO TABS: 5.0000 mg | ORAL_TABLET | Freq: Once | ORAL | Status: DC | PRN

## 2015-12-30 MED ORDER — PROPOFOL 10 MG/ML IV BOLUS
INTRAVENOUS | Status: DC | PRN
  Administered 2015-12-30: 200 mg via INTRAVENOUS

## 2015-12-30 MED ORDER — MEPERIDINE HCL 25 MG/ML IJ SOLN: 6.2500 mg | INTRAMUSCULAR | Status: DC | PRN

## 2015-12-30 MED ORDER — DEXAMETHASONE SODIUM PHOSPHATE 4 MG/ML IJ SOLN
INTRAMUSCULAR | Status: DC | PRN
  Administered 2015-12-30: 10 mg via INTRAVENOUS

## 2015-12-30 MED ORDER — BUPIVACAINE-EPINEPHRINE (PF) 0.25% -1:200000 IJ SOLN
INTRAMUSCULAR | Status: AC
  Filled 2015-12-30: qty 30

## 2015-12-30 MED ORDER — OXYCODONE HCL 5 MG/5ML PO SOLN: 5.0000 mg | Freq: Once | ORAL | Status: DC | PRN

## 2015-12-30 MED ORDER — HYDROMORPHONE HCL 1 MG/ML IJ SOLN
0.2500 mg | INTRAMUSCULAR | Status: DC | PRN
  Administered 2015-12-30 (×2): 0.5 mg via INTRAVENOUS

## 2015-12-30 MED ORDER — MIDAZOLAM HCL 2 MG/2ML IJ SOLN
INTRAMUSCULAR | Status: AC
  Filled 2015-12-30: qty 2

## 2015-12-30 MED ORDER — CEFAZOLIN SODIUM-DEXTROSE 2-3 GM-% IV SOLR
INTRAVENOUS | Status: AC
  Filled 2015-12-30: qty 50

## 2015-12-30 MED ORDER — CHLORHEXIDINE GLUCONATE 4 % EX LIQD: 1.0000 "application " | Freq: Once | CUTANEOUS | Status: DC

## 2015-12-30 SURGICAL SUPPLY — 48 items
APL SKNCLS STERI-STRIP NONHPOA (GAUZE/BANDAGES/DRESSINGS) ×1
BENZOIN TINCTURE PRP APPL 2/3 (GAUZE/BANDAGES/DRESSINGS) ×3 IMPLANT
BLADE CLIPPER SURG (BLADE) IMPLANT
BLADE SURG 15 STRL LF DISP TIS (BLADE) ×1 IMPLANT
BLADE SURG 15 STRL SS (BLADE) ×3
CANISTER SUCT 1200ML W/VALVE (MISCELLANEOUS) IMPLANT
CHLORAPREP W/TINT 26ML (MISCELLANEOUS) ×3 IMPLANT
CLOSURE WOUND 1/2 X4 (GAUZE/BANDAGES/DRESSINGS) ×1
COVER BACK TABLE 60X90IN (DRAPES) ×3 IMPLANT
COVER MAYO STAND STRL (DRAPES) ×3 IMPLANT
DECANTER SPIKE VIAL GLASS SM (MISCELLANEOUS) IMPLANT
DRAPE LAPAROTOMY 100X72 PEDS (DRAPES) ×3 IMPLANT
DRAPE UTILITY XL STRL (DRAPES) ×3 IMPLANT
DRSG TEGADERM 2-3/8X2-3/4 SM (GAUZE/BANDAGES/DRESSINGS) ×3 IMPLANT
ELECT COATED BLADE 2.86 ST (ELECTRODE) ×3 IMPLANT
ELECT REM PT RETURN 9FT ADLT (ELECTROSURGICAL) ×3
ELECTRODE REM PT RTRN 9FT ADLT (ELECTROSURGICAL) ×1 IMPLANT
GLOVE BIO SURGEON STRL SZ7 (GLOVE) ×3 IMPLANT
GLOVE BIOGEL PI IND STRL 7.0 (GLOVE) ×1 IMPLANT
GLOVE BIOGEL PI IND STRL 7.5 (GLOVE) ×1 IMPLANT
GLOVE BIOGEL PI INDICATOR 7.0 (GLOVE) ×2
GLOVE BIOGEL PI INDICATOR 7.5 (GLOVE) ×2
GLOVE ECLIPSE 6.5 STRL STRAW (GLOVE) ×3 IMPLANT
GLOVE EXAM NITRILE EXT CUFF MD (GLOVE) ×3 IMPLANT
GOWN STRL REUS W/ TWL LRG LVL3 (GOWN DISPOSABLE) ×2 IMPLANT
GOWN STRL REUS W/TWL LRG LVL3 (GOWN DISPOSABLE) ×4
NEEDLE HYPO 25X1 1.5 SAFETY (NEEDLE) ×3 IMPLANT
NS IRRIG 1000ML POUR BTL (IV SOLUTION) IMPLANT
PACK BASIN DAY SURGERY FS (CUSTOM PROCEDURE TRAY) ×3 IMPLANT
PENCIL BUTTON HOLSTER BLD 10FT (ELECTRODE) ×3 IMPLANT
SPONGE GAUZE 2X2 8PLY STER LF (GAUZE/BANDAGES/DRESSINGS) ×1
SPONGE GAUZE 2X2 8PLY STRL LF (GAUZE/BANDAGES/DRESSINGS) ×2 IMPLANT
SPONGE GAUZE 4X4 12PLY STER LF (GAUZE/BANDAGES/DRESSINGS) IMPLANT
STRIP CLOSURE SKIN 1/2X4 (GAUZE/BANDAGES/DRESSINGS) ×2 IMPLANT
SUT MON AB 4-0 PC3 18 (SUTURE) ×3 IMPLANT
SUT PROLENE 6 0 P 1 18 (SUTURE) IMPLANT
SUT SILK 2 0 FS (SUTURE) IMPLANT
SUT SILK 3 0 TIES 17X18 (SUTURE) ×3
SUT SILK 3-0 18XBRD TIE BLK (SUTURE) ×1 IMPLANT
SUT VIC AB 3-0 SH 27 (SUTURE) ×3
SUT VIC AB 3-0 SH 27X BRD (SUTURE) ×1 IMPLANT
SUT VICRYL 3-0 CR8 SH (SUTURE) IMPLANT
SYR CONTROL 10ML LL (SYRINGE) ×3 IMPLANT
TOWEL OR 17X24 6PK STRL BLUE (TOWEL DISPOSABLE) ×3 IMPLANT
TOWEL OR NON WOVEN STRL DISP B (DISPOSABLE) IMPLANT
TUBE CONNECTING 20'X1/4 (TUBING)
TUBE CONNECTING 20X1/4 (TUBING) IMPLANT
YANKAUER SUCT BULB TIP NO VENT (SUCTIONS) IMPLANT

## 2015-12-30 NOTE — Anesthesia Procedure Notes (Signed)
Procedure Name: LMA Insertion Date/Time: 12/30/2015 1:27 PM Performed by: Melynda Ripple D Pre-anesthesia Checklist: Patient identified, Emergency Drugs available, Suction available and Patient being monitored Patient Re-evaluated:Patient Re-evaluated prior to inductionOxygen Delivery Method: Circle System Utilized Preoxygenation: Pre-oxygenation with 100% oxygen Intubation Type: IV induction Ventilation: Mask ventilation without difficulty LMA: LMA inserted LMA Size: 4.0 Number of attempts: 1 Airway Equipment and Method: Bite block Placement Confirmation: positive ETCO2 Tube secured with: Tape Dental Injury: Teeth and Oropharynx as per pre-operative assessment

## 2015-12-30 NOTE — Op Note (Signed)
Pre-op Diagnosis:  Subcutaneous mass - anterior left leg Post-op Diagnosis:  Same Procedure:  Excision of subcutaneous mass - anterior left leg Surgeon:  Folashade Gamboa K. Anesthesia:  Gen - LMA  Indications:  This is a 60 year old female who presents with a tender palpable hard mass in her anterior left leg. Previously this area was slightly discolored but now the skin appears normal. This area is tender she would like to have it excised.  Description of procedure: The patient brought to the operating room placed in a supine position on operating table. After an adequate level general anesthesia was obtained her left leg was prepped with ChloraPrep and draped sterile fashion. A timeout was taken to ensure the proper patient and proper procedure. We made a 1.5 cm incision vertically over the palpable mass. Dissection was carried down to the subcutaneous tissues tissues with cautery. A small vein was ligated with 3-0 silk sutures. We excised all the firm mass that we could palpate. This does not appear to extend all way down to the muscle. This seems to be in the subcutaneous tissue. We inspected for hemostasis. We infiltrated 0.25% Marcaine with epinephrine around the wound. The wound was closed with 3-0 Vicryl and 4-0 Monocryl. Steri-Strips and clean dressings were applied. Patient was then extubated but recovery was stable condition. All sponge, initially, and needle counts are correct.  Erica [name]. Erica Dover, MD, [facility] Surgery  General/ Trauma Surgery  12/30/2015 2:02 PM

## 2015-12-30 NOTE — Discharge Instructions (Signed)
Ice pack as needed Tramadol as needed You may remove the dressing on Thursday and shower over the steri-strips.  They will come off on their own over the next couple of weeks. The sutures will dissolve on her own.  No limits on activity.    Post Anesthesia Home Care Instructions  Activity: Get plenty of rest for the remainder of the day. A responsible adult should stay with you for 24 hours following the procedure.  For the next 24 hours, DO NOT: -Drive a car -Paediatric nurse -Drink alcoholic beverages -Take any medication unless instructed by your physician -Make any legal decisions or sign important papers.  Meals: Start with liquid foods such as gelatin or soup. Progress to regular foods as tolerated. Avoid greasy, spicy, heavy foods. If nausea and/or vomiting occur, drink only clear liquids until the nausea and/or vomiting subsides. Call your physician if vomiting continues.  Special Instructions/Symptoms: Your throat may feel dry or sore from the anesthesia or the breathing tube placed in your throat during surgery. If this causes discomfort, gargle with warm salt water. The discomfort should disappear within 24 hours.  If you had a scopolamine patch placed behind your ear for the management of post- operative nausea and/or vomiting:  1. The medication in the patch is effective for 72 hours, after which it should be removed.  Wrap patch in a tissue and discard in the trash. Wash hands thoroughly with soap and water. 2. You may remove the patch earlier than 72 hours if you experience unpleasant side effects which may include dry mouth, dizziness or visual disturbances. 3. Avoid touching the patch. Wash your hands with soap and water after contact with the patch.

## 2015-12-30 NOTE — Interval H&P Note (Signed)
History and Physical Interval Note:  12/30/2015 1:05 PM  Erica [name]  has presented today for surgery, with the diagnosis of Subcutaneous mass anterior left leg   The various methods of treatment have been discussed with the patient and family. After consideration of risks, benefits and other options for treatment, the patient has consented to  Procedure(s): EXCISION LEFT LEG MASS (Left) as a surgical intervention .  The patient's history has been reviewed, patient examined, no change in status, stable for surgery.  I have reviewed the patient's chart and labs.  Questions were answered to the patient's satisfaction.     Chelsye Suhre K.

## 2015-12-30 NOTE — Anesthesia Preprocedure Evaluation (Signed)
Anesthesia Evaluation  Patient identified by MRN, date of birth, ID band Patient awake    Reviewed: Allergy & Precautions, NPO status , Patient's Chart, lab work & pertinent test results  Airway Mallampati: I  TM Distance: >3 FB Neck ROM: Full    Dental  (+) Teeth Intact, Dental Advisory Given   Pulmonary sleep apnea and Continuous Positive Airway Pressure Ventilation ,    breath sounds clear to auscultation       Cardiovascular hypertension, Pt. on medications and Pt. on home beta blockers  Rhythm:Regular Rate:Normal     Neuro/Psych    GI/Hepatic GERD  Medicated and Controlled,  Endo/Other  Morbid obesity  Renal/GU      Musculoskeletal   Abdominal   Peds  Hematology   Anesthesia Other Findings   Reproductive/Obstetrics                             Anesthesia Physical Anesthesia Plan  ASA: III  Anesthesia Plan: General   Post-op Pain Management:    Induction: Intravenous  Airway Management Planned: LMA  Additional Equipment:   Intra-op Plan:   Post-operative Plan: Extubation in OR  Informed Consent: I have reviewed the patients History and Physical, chart, labs and discussed the procedure including the risks, benefits and alternatives for the proposed anesthesia with the patient or authorized representative who has indicated his/her understanding and acceptance.   Dental advisory given  Plan Discussed with: CRNA, Anesthesiologist and Surgeon  Anesthesia Plan Comments:         Anesthesia Quick Evaluation

## 2015-12-30 NOTE — H&P (View-Only) (Signed)
History of Present Illness Erica [name]. Erica Cedar MD; 12/16/2015 11:56 AM) Patient words: cyst left leg.  The patient is a 60 year old female who presents with a complaint of Mass. Referred by Dr. Cathlean Cower for left leg mass  This is a 60 year old female who presents with several months of a tender palpable mass on her anterior left leg in the pretibial area. She states that this area was red but currently the skin overlying it appears normal. This area is fairly tender to palpation. There has been no drainage from this area. There is no imaging of this area.   Other Problems Erica [name], [name]; 12/16/2015 9:28 AM) Anxiety Disorder Back Pain Gastroesophageal Reflux Disease High blood pressure Hypercholesterolemia Migraine Headache Oophorectomy Right. Sleep Apnea  Past Surgical History Erica [name], [place]; 12/16/2015 9:28 AM) Hysterectomy (not due to cancer) - Partial Knee Surgery Right.  Diagnostic Studies History Erica [name], [name]; 12/16/2015 9:28 AM) Mammogram 1-3 years ago  Allergies Erica [name], [name]; 12/16/2015 9:30 AM) Codeine Phosphate *ANALGESICS - OPIOID* Wellbutrin *ANTIDEPRESSANTS* GuaiFENesin *COUGH/COLD/ALLERGY*  Medication History (Erica [name], [name]; 12/16/2015 9:30 AM) Atenolol (50MG  Tablet, Oral) Active. ClonazePAM (1MG  Tablet, Oral) Active. Flonase (50MCG/ACT Suspension, Nasal) Active. Lasix (40MG  Tablet, Oral) Active. Anusol-HC (2.5% Cream, Rectal) Active. Lactulose (10GM/15ML Solution, Oral) Active. Linzess (145MCG Capsule, Oral) Active. Claritin (10MG  Tablet, Oral) Active. Antivert (12.5MG  Tablet, Oral) Active. PriLOSEC (20MG  Capsule DR, Oral) Active. Paxil CR (25MG  Tablet ER 24HR, Oral) Active. Potassium Chloride Crys ER (20MEQ Tablet ER, Oral) Active. Simvastatin (40MG  Tablet, Oral) Active. Treximet (85-500MG  Tablet, Oral) Active. Zanaflex (2MG  Capsule, Oral) Active. TraMADol HCl (50MG  Tablet, Oral) Active. Medications  Reconciled  Social History Erica [name], [name]; 12/16/2015 9:28 AM) Alcohol use Moderate alcohol use. Caffeine use Carbonated beverages, Coffee, Tea.  Family History Erica [name], [place]; 12/16/2015 9:28 AM) Alcohol Abuse Brother, Daughter, Father. Arthritis Mother. Breast Cancer Family Members In General. Cancer Brother. Depression Family Members In General. Heart Disease Mother. Hypertension Mother, Son. Migraine Headache Family Members In General.  Pregnancy / Birth History Erica [name], [place]; 12/16/2015 9:28 AM) Age at menarche 24 years. Age of menopause <43 Gravida 3 Maternal age 80-20 Para 3     Review of Systems ([place]; 12/16/2015 9:28 AM) General Present- Fatigue and Weight Loss. Not Present- Appetite Loss, Chills, Fever, Night Sweats and Weight Gain. HEENT Present- Sinus Pain. Not Present- Earache, Hearing Loss, Hoarseness, Nose Bleed, Oral Ulcers, Ringing in the Ears, Seasonal Allergies, Sore Throat, Visual Disturbances, Wears glasses/contact lenses and Yellow Eyes. Respiratory Present- Snoring. Not Present- Bloody sputum, Chronic Cough, Difficulty Breathing and Wheezing. Cardiovascular Present- Palpitations. Not Present- Chest Pain, Difficulty Breathing Lying Down, Leg Cramps, Rapid Heart Rate, Shortness of Breath and Swelling of Extremities. Gastrointestinal Present- Constipation and Hemorrhoids. Not Present- Abdominal Pain, Bloating, Bloody Stool, Change in Bowel Habits, Chronic diarrhea, Difficulty Swallowing, Excessive gas, Gets full quickly at meals, Indigestion, Nausea, Rectal Pain and Vomiting. Musculoskeletal Present- Back Pain, Joint Pain and Joint Stiffness. Not Present- Muscle Pain, Muscle Weakness and Swelling of Extremities. Neurological Present- Headaches. Not Present- Decreased Memory, Fainting, Numbness, Seizures, Tingling, Tremor, Trouble walking and Weakness. Psychiatric Present- Anxiety, Depression and Frequent crying. Not Present- Bipolar,  Change in Sleep Pattern and Fearful.  Vitals (Erica [name] [name]; 12/16/2015 9:29 AM) 12/16/2015 9:29 AM Weight: 263 lb Height: 67in Body Surface Area: 2.27 m Body Mass Index: 41.19 kg/m  Temp.: 97.79F(Temporal)  Pulse: 79 (Regular)  BP: 130/84 (Sitting, Left Arm, Standard)      Physical Exam Erica Key K.  Retaj Hilbun MD; 12/16/2015 11:56 AM)  The physical exam findings are as follows: Note:WDWN in nAD Left anterior leg - 1.5 cm palpable firm subcutaneous mass; mobile; no overlying skin changes    Assessment & Plan Erica Key K. Nataleigh Griffin MD; 12/16/2015 9:42 AM)  SUBCUTANEOUS NODULE (R22.9) Impression: 2 cm - anterior left tibial region  Current Plans Schedule for Surgery - Excision of subcutaneous nodule - left leg. The surgical procedure has been discussed with the patient. Potential risks, benefits, alternative treatments, and expected outcomes have been explained. All of the patient's questions at this time have been answered. The likelihood of reaching the patient's treatment goal is good. The patient understand the proposed surgical procedure and wishes to proceed.  Erica [name]. Georgette Dover, MD, [facility] Surgery  General/ Trauma Surgery  12/16/2015 11:58 AM

## 2015-12-30 NOTE — Anesthesia Postprocedure Evaluation (Signed)
Anesthesia Post Note  Patient: Erica [name]  Procedure(s) Performed: Procedure(s) (LRB): EXCISION LEFT LEG MASS (Left)  Patient location during evaluation: PACU Anesthesia Type: General Level of consciousness: awake and alert Pain management: pain level controlled Vital Signs Assessment: post-procedure vital signs reviewed and stable Respiratory status: spontaneous breathing, nonlabored ventilation and respiratory function stable Cardiovascular status: blood pressure returned to baseline and stable Postop Assessment: no signs of nausea or vomiting Anesthetic complications: no    Last Vitals:  Filed Vitals:   12/30/15 1445 12/30/15 1500  BP: 129/87 114/76  Pulse: 69 70  Temp:    Resp: 8 17    Last Pain:  Filed Vitals:   12/30/15 1503  PainSc: 2                  Kendrah Lovern A

## 2015-12-30 NOTE — Transfer of Care (Signed)
Immediate Anesthesia Transfer of Care Note  Patient: Erica [name]  Procedure(s) Performed: Procedure(s): EXCISION LEFT LEG MASS (Left)  Patient Location: PACU  Anesthesia Type:General  Level of Consciousness: awake and alert   Airway & Oxygen Therapy: Patient Spontanous Breathing and Patient connected to face mask oxygen  Post-op Assessment: Report given to RN and Post -op Vital signs reviewed and stable  Post vital signs: Reviewed and stable  Last Vitals:  Filed Vitals:   12/30/15 1409 12/30/15 1410  BP: 130/95   Pulse: 77 77  Temp:    Resp:  12    Complications: No apparent anesthesia complications

## 2015-12-31 ENCOUNTER — Encounter (HOSPITAL_BASED_OUTPATIENT_CLINIC_OR_DEPARTMENT_OTHER): Payer: Self-pay | Admitting: Surgery

## 2016-01-05 ENCOUNTER — Ambulatory Visit (INDEPENDENT_AMBULATORY_CARE_PROVIDER_SITE_OTHER): Payer: Medicare Other | Admitting: Pulmonary Disease

## 2016-01-05 ENCOUNTER — Encounter: Payer: Self-pay | Admitting: Pulmonary Disease

## 2016-01-05 VITALS — BP 110/78 | HR 71 | Ht 67.0 in | Wt 261.2 lb

## 2016-01-05 DIAGNOSIS — G4733 Obstructive sleep apnea (adult) (pediatric): Secondary | ICD-10-CM | POA: Diagnosis not present

## 2016-01-05 NOTE — Progress Notes (Signed)
   Subjective:    Patient ID: Erica [name], female    DOB: 1956-03-20, 60 y.o.   MRN: [id number]  HPI  60 year old never smoker for FU of obstructive sleep apnea.  In 11/2009 She was found to have mild osa with AHI of 10/hr and desat as low as 83% - wt 240 lbs - BMI 39  She was  poorly compliant .she had an ENT evaluation and was told that her left nostril is blocked, allergy evaluation was not helpful.  She is maintained on atenolol due to palpitations although atrial fibrillation has not been documented   Download 07/26/13 - on auto 5-20 cm , avg pr 12 cm  No residuals, poor usage    01/05/2016  Chief Complaint  Patient presents with  . Follow-up    not doing well on CPAP machine.  getting sinus headaches with machine.  Patient wants to talk to Dr. [name] about getting another sleep study with CPAP on.   Last seen 11/2013  PSG 09/2013 showed mild OSA- AHI 13/h  Has gained 20 lbs from 240 to 260 since this study Has been unable to tolerate CPAP for various reasons - sinus headaches, sore throat unable to tolerate mask Is worried about cardiac side effects of OSA, complains of excessive daytime fatigue CT head showed meningioma & has cluster headaches Medication review shows Zanaflex daily , clonazepam and tramadol for pain.  Review of Systems  Patient denies significant dyspnea,cough, hemoptysis,  chest pain, palpitations, pedal edema, orthopnea, paroxysmal nocturnal dyspnea, lightheadedness, nausea, vomiting, abdominal or  leg pains      Objective:   Physical Exam  Gen. Pleasant, obese, in no distress ENT - no lesions, no post nasal drip Neck: No JVD, no thyromegaly, no carotid bruits Lungs: no use of accessory muscles, no dullness to percussion, decreased without rales or rhonchi  Cardiovascular: Rhythm regular, heart sounds  normal, no murmurs or gallops, no peripheral edema Musculoskeletal: No deformities, no cyanosis or clubbing , no tremors       Assessment &  Plan:

## 2016-01-05 NOTE — Assessment & Plan Note (Addendum)
She certainly has mild to moderate OSA-it is difficult to tease out her symptoms off OSA from her medications  Home sleep study since wt gain since last study Unable to tolerate CPAP Referral to dentist - Dr Ron Parker for dental device  Once you get this ,we can repeat study for improvement  I explained to her that the cardiovascular implications of mild OSA were minimal if at all

## 2016-01-05 NOTE — Patient Instructions (Signed)
Home sleep study Referral to dentist - Dr Ron Parker for dental device  Once you get this ,we can repeat study for improvement

## 2016-01-06 NOTE — Assessment & Plan Note (Signed)
Weight loss encouraged in the long run

## 2016-01-26 DIAGNOSIS — G4733 Obstructive sleep apnea (adult) (pediatric): Secondary | ICD-10-CM | POA: Diagnosis not present

## 2016-01-29 ENCOUNTER — Telehealth: Payer: Self-pay | Admitting: Pulmonary Disease

## 2016-01-29 DIAGNOSIS — G4733 Obstructive sleep apnea (adult) (pediatric): Secondary | ICD-10-CM | POA: Diagnosis not present

## 2016-01-29 NOTE — Telephone Encounter (Signed)
Per Dr. [name]: Home Sleep Test results from 01/26/16 reveal that patient's sleep apnea has worsened due to weight gain; AHI: 20/hr; proceed with oral appliance, needs Dental Referral. ---------------- Patient aware of results.  Dental referral placed. Nothing further needed.

## 2016-01-30 ENCOUNTER — Other Ambulatory Visit: Payer: Self-pay | Admitting: *Deleted

## 2016-01-30 DIAGNOSIS — G4733 Obstructive sleep apnea (adult) (pediatric): Secondary | ICD-10-CM

## 2016-02-10 DIAGNOSIS — F410 Panic disorder [episodic paroxysmal anxiety] without agoraphobia: Secondary | ICD-10-CM | POA: Diagnosis not present

## 2016-02-10 DIAGNOSIS — F411 Generalized anxiety disorder: Secondary | ICD-10-CM | POA: Diagnosis not present

## 2016-02-10 DIAGNOSIS — F331 Major depressive disorder, recurrent, moderate: Secondary | ICD-10-CM | POA: Diagnosis not present

## 2016-02-13 ENCOUNTER — Encounter: Payer: Self-pay | Admitting: Internal Medicine

## 2016-02-13 ENCOUNTER — Ambulatory Visit (INDEPENDENT_AMBULATORY_CARE_PROVIDER_SITE_OTHER): Payer: Medicare Other | Admitting: Internal Medicine

## 2016-02-13 ENCOUNTER — Other Ambulatory Visit (INDEPENDENT_AMBULATORY_CARE_PROVIDER_SITE_OTHER): Payer: Medicare Other

## 2016-02-13 VITALS — BP 100/72 | HR 79 | Temp 98.9°F | Resp 16 | Wt 263.0 lb

## 2016-02-13 DIAGNOSIS — I10 Essential (primary) hypertension: Secondary | ICD-10-CM

## 2016-02-13 DIAGNOSIS — R42 Dizziness and giddiness: Secondary | ICD-10-CM

## 2016-02-13 DIAGNOSIS — R7303 Prediabetes: Secondary | ICD-10-CM | POA: Diagnosis not present

## 2016-02-13 DIAGNOSIS — E669 Obesity, unspecified: Secondary | ICD-10-CM | POA: Diagnosis not present

## 2016-02-13 DIAGNOSIS — E119 Type 2 diabetes mellitus without complications: Secondary | ICD-10-CM

## 2016-02-13 LAB — GLUCOSE, POCT (MANUAL RESULT ENTRY): POC Glucose: 110 mg/dl — AB (ref 70–99)

## 2016-02-13 LAB — COMPREHENSIVE METABOLIC PANEL
ALT: 9 U/L (ref 0–35)
AST: 13 U/L (ref 0–37)
Albumin: 4.3 g/dL (ref 3.5–5.2)
Alkaline Phosphatase: 140 U/L — ABNORMAL HIGH (ref 39–117)
BUN: 16 mg/dL (ref 6–23)
CO2: 29 mEq/L (ref 19–32)
Calcium: 9.3 mg/dL (ref 8.4–10.5)
Chloride: 102 mEq/L (ref 96–112)
Creatinine, Ser: 0.75 mg/dL (ref 0.40–1.20)
GFR: 100.63 mL/min (ref >60.00)
Glucose, Bld: 101 mg/dL — ABNORMAL HIGH (ref 70–99)
Potassium: 3.5 mEq/L (ref 3.5–5.1)
Sodium: 140 mEq/L (ref 135–145)
Total Bilirubin: 0.5 mg/dL (ref 0.2–1.2)
Total Protein: 7.7 g/dL (ref 6.0–8.3)

## 2016-02-13 LAB — HEMOGLOBIN A1C: Hgb A1c MFr Bld: 6.6 % — ABNORMAL HIGH (ref 4.6–6.5)

## 2016-02-13 MED ORDER — METFORMIN HCL 500 MG PO TABS: 500.0000 mg | ORAL_TABLET | Freq: Every day | ORAL | Status: DC

## 2016-02-13 NOTE — Assessment & Plan Note (Signed)
Has been having some lightheadedness intermittently ? Sugars vs low blood pressure Glucose 110 today The other day her BP was on the high side and has been overall well controlled Will check a1c

## 2016-02-13 NOTE — Assessment & Plan Note (Signed)
Concern for sugars not being as controlled due to wound not healing - I doubt her sugars are uncontrolled that much that it would prevent healing of her wound Will check a1c, cmp Discussed prevention of diabetes and weight loss, diet changes - will start a trial of metformin - hopefully this will prevent diabetes and aid in weight loss

## 2016-02-13 NOTE — Assessment & Plan Note (Signed)
BP well controlled Current regimen effective and well tolerated Continue current medications at current doses  

## 2016-02-13 NOTE — Progress Notes (Signed)
Pre visit review using our clinic review tool, if applicable. No additional management support is needed unless otherwise documented below in the visit note. 

## 2016-02-13 NOTE — Progress Notes (Signed)
Subjective:    Patient ID: Erica [name], female    DOB: 04/02/1956, 60 y.o.   MRN: [id number]  HPI  She is here for an acute visit.    She had surgery on a nodule on her left lower leg and the wound is not healing.  The surgeon wanted her to be evaluated for possible diabetes.  Her sugars have been in the prediabetic range and her last a1c was in December - it was still in the prediabetic range.  She has been less active and has gained weight.   She is frustrated that she keep gaining weight.  She has never been this heavy.  She can only do water exercises and can not get in the water while her wound has not healed.    Hypertension: She is taking her medication daily. She is compliant with a low sodium diet.  She denies chest pain, shortness of breath and regular headaches. She is not exercising regularly.     Medications and allergies reviewed with patient and updated if appropriate.  Patient Active Problem List   Diagnosis Date Noted  . Left leg pain 11/28/2015  . Skin nodule 11/28/2015  . Migraine without aura and with status migrainosus, not intractable 10/03/2015  . Numbness of left hand 10/03/2015  . Cephalalgia 10/03/2015  . Prediabetes 09/21/2015  . Hyperkalemia 09/21/2015  . Orthostatic hypotension 09/20/2013  . Abnormal ECG 09/11/2012  . Palpitations 09/08/2012  . Sciatica of left side   . Peripheral edema 05/09/2012  . FATIGUE 02/23/2010  . Obstructive sleep apnea 10/31/2009  . Morbid obesity ([place]) 10/17/2009  . HEMORRHOIDS, EXTERNAL 10/17/2009  . CONSTIPATION, CHRONIC 10/17/2009  . URTICARIA 03/11/2009  . Hyperlipidemia 01/12/2008  . Depression, major, recurrent ([place]) 01/12/2008  . Essential hypertension, benign 01/12/2008  . Allergic rhinitis 01/12/2008    Current Outpatient Prescriptions on File Prior to Visit  Medication Sig Dispense Refill  . atenolol (TENORMIN) 50 MG tablet Take 1 tablet (50 mg total) by mouth daily. 90 tablet 3  . clonazePAM  (KLONOPIN) 1 MG tablet Take 1 mg by mouth 2 (two) times daily.    . fluticasone (FLONASE) 50 MCG/ACT nasal spray New Sig: Spray once into each nostril up to twice a day as needed for . 16 g 2  . furosemide (LASIX) 40 MG tablet Take 1 tablet (40 mg total) by mouth 2 (two) times daily. 180 tablet 3  . hydrocortisone (ANUSOL-HC) 2.5 % rectal cream Place rectally as needed. 30 g 3  . Linaclotide (LINZESS) 145 MCG CAPS capsule Take 1 capsule (145 mcg total) by mouth daily. 90 capsule 3  . Loratadine (CLARITIN PO) Take by mouth.    . meclizine (ANTIVERT) 25 MG tablet Take 1 tablet (25 mg total) by mouth 3 (three) times daily as needed for dizziness or nausea. 180 tablet 3  . omeprazole (PRILOSEC) 20 MG capsule Take 1 capsule (20 mg total) by mouth daily. 90 capsule 3  . potassium chloride SA (K-DUR,KLOR-CON) 20 MEQ tablet Take 1 tablet (20 mEq total) by mouth 2 (two) times daily. 180 tablet 3  . simvastatin (ZOCOR) 40 MG tablet Take 1 tablet (40 mg total) by mouth daily at 6 PM. 90 tablet 3  . SUMAtriptan-naproxen (TREXIMET) 85-500 MG tablet Take 1 tablet by mouth every 2 (two) hours as needed for migraine.    . tizanidine (ZANAFLEX) 2 MG capsule Take 1 capsule (2 mg total) by mouth 3 (three) times daily as needed for muscle spasms.  270 capsule 1  . traMADol (ULTRAM) 50 MG tablet Take 1 tablet (50 mg total) by mouth every 8 (eight) hours as needed. 40 tablet 0  . venlafaxine (EFFEXOR) 75 MG tablet Take 75 mg by mouth 2 (two) times daily.    . [DISCONTINUED] oxycodone (OXY-IR) 5 MG capsule Take 5 mg by mouth every 4 (four) hours as needed. For pain.     No current facility-administered medications on file prior to visit.    Past Medical History  Diagnosis Date  . Hypertension   . ALLERGIC RHINITIS   . Sleep apnea     CPAP hs  . PAF (paroxysmal atrial fibrillation) ([place])     pt denies  . Dyslipidemia   . Panic attacks     Hx of depression  . Osteoarthritis   . Obesity   . Depression   .  External hemorrhoids   . GERD (gastroesophageal reflux disease)   . Allergy   . Chronic kidney disease   . DDD (degenerative disc disease), lumbar     ESI with [facility] (spring 2016)    Past Surgical History  Procedure Laterality Date  . Replacement total knee Right   . Abdominal hysterectomy    . Right oophorectomy      No cancer  . Mass excision Left 12/30/2015    Procedure: EXCISION LEFT LEG MASS;  Surgeon: Donnie Mesa, MD;  Location: [facility];  Service: General;  Laterality: Left;    Social History   Social History  . Marital Status: Married    Spouse Name: N/A  . Number of Children: 3  . Years of Education: N/A   Occupational History  . disabled    Social History Main Topics  . Smoking status: Never Smoker   . Smokeless tobacco: Never Used  . Alcohol Use: 0.6 oz/week    1 Glasses of wine per week     Comment: one drink a month  . Drug Use: No  . Sexual Activity: Not Currently   Other Topics Concern  . Not on file   Social History Narrative   Married with children. Pt is on disablity. Previous worked in the school system    Family History  Problem Relation Age of Onset  . Heart disease Mother   . Dementia Father   . Allergies Sister   . Asthma Son   . Breast cancer Maternal Aunt     cousin  . Asthma Other   . Colon cancer Cousin   . Heart disease Son     CHF  . Diabetes Mother   . Thyroid disease Mother   . Prostate cancer Maternal Uncle   . Kidney failure Father   . Prostate cancer Father   . Diabetes Son     Review of Systems  Constitutional: Negative for fever.  Respiratory: Negative for cough, shortness of breath and wheezing.   Cardiovascular: Positive for palpitations and leg swelling (takes lasix). Negative for chest pain.  Endocrine: Positive for polydipsia (somewhat) and polyuria (? also taking lasix).  Neurological: Positive for light-headedness. Negative for headaches.       Objective:   Filed Vitals:    02/13/16 1623  BP: 100/72  Pulse: 79  Temp: 98.9 F (37.2 C)  Resp: 16   Filed Weights   02/13/16 1623  Weight: 263 lb (119.296 kg)   Body mass index is 41.18 kg/(m^2).   Physical Exam Constitutional: Appears well-developed and well-nourished. No distress.  Neck: Neck supple. No tracheal  deviation present. No thyromegaly present.  No carotid bruit. No cervical adenopathy.   Cardiovascular: Normal rate, regular rhythm and normal heart sounds.   No murmur heard.  trace edema Pulmonary/Chest: Effort normal and breath sounds normal. No respiratory distress. No wheezes.       Assessment & Plan:   See Problem List for Assessment and Plan of chronic medical problems.

## 2016-02-13 NOTE — Assessment & Plan Note (Signed)
Stressed exercise - can only to water exercises - will restart when she can Discussed dietary changes Will start metformin for prediabetes - hopefully that will aid in weight loss

## 2016-02-13 NOTE — Patient Instructions (Addendum)
  Test(s) ordered today. Your results will be released to [facility] (or called to you) after review, usually within 72hours after test completion. If any changes need to be made, you will be notified at that same time.   Medications reviewed and updated.  Changes include starting metformin for your sugar.

## 2016-02-15 ENCOUNTER — Encounter: Payer: Self-pay | Admitting: Internal Medicine

## 2016-02-15 DIAGNOSIS — E1169 Type 2 diabetes mellitus with other specified complication: Secondary | ICD-10-CM | POA: Insufficient documentation

## 2016-02-15 DIAGNOSIS — E119 Type 2 diabetes mellitus without complications: Secondary | ICD-10-CM | POA: Insufficient documentation

## 2016-02-17 ENCOUNTER — Telehealth: Payer: Self-pay | Admitting: Internal Medicine

## 2016-02-17 NOTE — Telephone Encounter (Signed)
Spoke with pt to inform of lab results. She was unable to get into MyChart. Reset password with pts approval while on the phone.

## 2016-02-17 NOTE — Telephone Encounter (Signed)
Patient is requesting call back with lab results.  °

## 2016-02-26 ENCOUNTER — Other Ambulatory Visit: Payer: Self-pay | Admitting: *Deleted

## 2016-02-26 MED ORDER — METFORMIN HCL 500 MG PO TABS: 500.0000 mg | ORAL_TABLET | Freq: Every day | ORAL | Status: DC

## 2016-02-26 NOTE — Telephone Encounter (Signed)
Pt left msg on triage stating she has not receive her mail order yet for her Metformin. Needing a rx sent to local pharmacy bcz she is out. Sent 30 day to [facility]...Johny Chess

## 2016-03-18 ENCOUNTER — Telehealth: Payer: Self-pay | Admitting: Internal Medicine

## 2016-03-18 NOTE — Telephone Encounter (Signed)
Pt called in said that she is having some type of reaction to the   metFORMIN (GLUCOPHAGE) 500 MG tablet [id number]      Can you call her when you get a chance?

## 2016-03-19 NOTE — Telephone Encounter (Signed)
LVM for pt to call back.

## 2016-03-23 ENCOUNTER — Encounter: Payer: Self-pay | Admitting: Internal Medicine

## 2016-03-23 ENCOUNTER — Ambulatory Visit (INDEPENDENT_AMBULATORY_CARE_PROVIDER_SITE_OTHER): Payer: Medicare Other | Admitting: Internal Medicine

## 2016-03-23 ENCOUNTER — Other Ambulatory Visit (INDEPENDENT_AMBULATORY_CARE_PROVIDER_SITE_OTHER): Payer: Medicare Other

## 2016-03-23 VITALS — BP 126/86 | HR 70 | Temp 98.6°F | Resp 16 | Ht 67.0 in | Wt 256.0 lb

## 2016-03-23 DIAGNOSIS — E119 Type 2 diabetes mellitus without complications: Secondary | ICD-10-CM

## 2016-03-23 DIAGNOSIS — R002 Palpitations: Secondary | ICD-10-CM

## 2016-03-23 DIAGNOSIS — I10 Essential (primary) hypertension: Secondary | ICD-10-CM | POA: Diagnosis not present

## 2016-03-23 DIAGNOSIS — E785 Hyperlipidemia, unspecified: Secondary | ICD-10-CM | POA: Diagnosis not present

## 2016-03-23 DIAGNOSIS — R142 Eructation: Secondary | ICD-10-CM

## 2016-03-23 DIAGNOSIS — F339 Major depressive disorder, recurrent, unspecified: Secondary | ICD-10-CM

## 2016-03-23 DIAGNOSIS — Z0000 Encounter for general adult medical examination without abnormal findings: Secondary | ICD-10-CM

## 2016-03-23 DIAGNOSIS — R143 Flatulence: Secondary | ICD-10-CM

## 2016-03-23 DIAGNOSIS — R141 Gas pain: Secondary | ICD-10-CM

## 2016-03-23 LAB — CBC WITH DIFFERENTIAL/PLATELET
Basophils Absolute: 0.1 10*3/uL (ref 0.0–0.1)
Basophils Relative: 0.6 % (ref 0.0–3.0)
Eosinophils Absolute: 0.1 10*3/uL (ref 0.0–0.7)
Eosinophils Relative: 1 % (ref 0.0–5.0)
HCT: 39.2 % (ref 36.0–46.0)
Hemoglobin: 12.9 g/dL (ref 12.0–15.0)
Lymphocytes Relative: 21.7 % (ref 12.0–46.0)
Lymphs Abs: 2 10*3/uL (ref 0.7–4.0)
MCHC: 33 g/dL (ref 30.0–36.0)
MCV: 84.7 fl (ref 78.0–100.0)
Monocytes Absolute: 0.7 10*3/uL (ref 0.1–1.0)
Monocytes Relative: 7.6 % (ref 3.0–12.0)
Neutro Abs: 6.2 10*3/uL (ref 1.4–7.7)
Neutrophils Relative %: 69.1 % (ref 43.0–77.0)
Platelets: 271 10*3/uL (ref 150.0–400.0)
RBC: 4.63 Mil/uL (ref 3.87–5.11)
RDW: 14.3 % (ref 11.5–15.5)
WBC: 9 10*3/uL (ref 4.0–10.5)

## 2016-03-23 LAB — COMPREHENSIVE METABOLIC PANEL
ALT: 9 U/L (ref 0–35)
AST: 13 U/L (ref 0–37)
Albumin: 4.3 g/dL (ref 3.5–5.2)
Alkaline Phosphatase: 128 U/L — ABNORMAL HIGH (ref 39–117)
BUN: 13 mg/dL (ref 6–23)
CO2: 32 mEq/L (ref 19–32)
Calcium: 9.7 mg/dL (ref 8.4–10.5)
Chloride: 101 mEq/L (ref 96–112)
Creatinine, Ser: 0.83 mg/dL (ref 0.40–1.20)
GFR: 89.49 mL/min (ref >60.00)
Glucose, Bld: 117 mg/dL — ABNORMAL HIGH (ref 70–99)
Potassium: 4 mEq/L (ref 3.5–5.1)
Sodium: 140 mEq/L (ref 135–145)
Total Bilirubin: 0.5 mg/dL (ref 0.2–1.2)
Total Protein: 7.9 g/dL (ref 6.0–8.3)

## 2016-03-23 LAB — LIPID PANEL
Cholesterol: 172 mg/dL (ref 0–200)
HDL: 45.8 mg/dL (ref >39.00)
LDL Cholesterol: 101 mg/dL — ABNORMAL HIGH (ref 0–99)
NonHDL: 126.47
Total CHOL/HDL Ratio: 4
Triglycerides: 126 mg/dL (ref 0.0–149.0)
VLDL: 25.2 mg/dL (ref 0.0–40.0)

## 2016-03-23 LAB — TSH: TSH: 1.74 u[IU]/mL (ref 0.35–4.50)

## 2016-03-23 LAB — MICROALBUMIN / CREATININE URINE RATIO
Creatinine,U: 416.5 mg/dL
Microalb Creat Ratio: 0.3 mg/g (ref 0.0–30.0)
Microalb, Ur: 1.2 mg/dL (ref 0.0–1.9)

## 2016-03-23 LAB — HEMOGLOBIN A1C: Hgb A1c MFr Bld: 6.3 % (ref 4.6–6.5)

## 2016-03-23 MED ORDER — OMEPRAZOLE 20 MG PO CPDR: 20.0000 mg | DELAYED_RELEASE_CAPSULE | Freq: Every day | ORAL | Status: DC

## 2016-03-23 MED ORDER — HYDROCORTISONE 2.5 % RE CREA: TOPICAL_CREAM | RECTAL | Status: DC | PRN

## 2016-03-23 MED ORDER — SUMATRIPTAN-NAPROXEN SODIUM 85-500 MG PO TABS: 1.0000 | ORAL_TABLET | ORAL | Status: DC | PRN

## 2016-03-23 MED ORDER — BLOOD GLUCOSE MONITOR KIT: PACK | Status: DC

## 2016-03-23 MED ORDER — LINACLOTIDE 145 MCG PO CAPS: 145.0000 ug | ORAL_CAPSULE | Freq: Every day | ORAL | Status: DC

## 2016-03-23 MED ORDER — FLUTICASONE PROPIONATE 50 MCG/ACT NA SUSP: NASAL | Status: DC

## 2016-03-23 MED ORDER — MECLIZINE HCL 25 MG PO TABS: 25.0000 mg | ORAL_TABLET | Freq: Three times a day (TID) | ORAL | Status: DC | PRN

## 2016-03-23 MED ORDER — ATENOLOL 50 MG PO TABS: 50.0000 mg | ORAL_TABLET | Freq: Every day | ORAL | Status: DC

## 2016-03-23 MED ORDER — SIMVASTATIN 40 MG PO TABS: 40.0000 mg | ORAL_TABLET | Freq: Every day | ORAL | Status: DC

## 2016-03-23 MED ORDER — POTASSIUM CHLORIDE CRYS ER 20 MEQ PO TBCR: 20.0000 meq | EXTENDED_RELEASE_TABLET | Freq: Two times a day (BID) | ORAL | Status: DC

## 2016-03-23 NOTE — Progress Notes (Signed)
Pre visit review using our clinic review tool, if applicable. No additional management support is needed unless otherwise documented below in the visit note. 

## 2016-03-23 NOTE — Assessment & Plan Note (Signed)
Management per psychiatry Depression related primarily to son status Medication recently adjusted

## 2016-03-23 NOTE — Assessment & Plan Note (Signed)
Taking simvastatin 40 mg daily Recheck lipid panel

## 2016-03-23 NOTE — Progress Notes (Signed)
Subjective:    Patient ID: Erica [name], female    DOB: 17-Dec-1955, 60 y.o.   MRN: [id number]  HPI Here for medicare wellness exam.   I have personally reviewed and have noted 1.The patient's medical and social history 2.Their use of alcohol, tobacco or illicit drugs 3.Their current medications and supplements 4.The patient's functional ability including ADL's, fall risks, home safety risks and                 hearing or visual impairment. 5.Diet and physical activities 6.Evidence for depression or mood disorders 7.Care team reviewed and updated -Pulmonary - Dr [name], Surgery/Wound - Dr Jonni Sanger  Diabetes:  This is a new diagnosis for her.  She has been a prediabetic, but last month her a1c was 6.6%  She was started on metformin and is taking it daily.  The medication is making her nauseous and she vomited once.  She is taking meclizine and that seems to be helping with nausea. She denies abdominal cramping and diarrhea.  She has been eating less and has lost weight. She is currently not exercising-she needs to wait for the wound on her leg she'll before she can get back into the pool.  Palpitations: She was having palpitations last week.  She was unsure if it was anxiety or not.  It typically came when she first woke up and then it would subside.  She did not have it during the day.  It has been a few days since she experienced them.  There has been some increased stress.   She denies any chest pain or shortness of breath.   Are there smokers in your home (other than you)? No  Risk Factors Exercise: not exercising - waiting for wound to heal to restart water exercises Dietary issues discussed: trying to cut down on sugars/carbs (new diagnosed diabetes), trying to improve diet overall  Cardiac risk factors: advanced age, hypertension, hyperlipidemia, and obesity (BMI >= 30 kg/m2).  Depression Screen  Have you felt down,  depressed or hopeless? Yes, medication dose increased a couple of weeks ago - following with psychiatry  Have you felt little interest or pleasure in doing things?  Yes   Activities of Daily Living In your present state of health, do you have any difficulty performing the following activities?:  Driving? No Managing money?  No Feeding yourself? No Getting from bed to chair? No Climbing a flight of stairs? No Preparing food and eating?: No Bathing or showering? No Getting dressed: No Getting to/using the toilet? No Moving around from place to place: No In the past year have you fallen or had a near fall?: No   Are you sexually active?  No  Do you have more than one partner?  N/A  Hearing Difficulties: No Do you often ask people to speak up or repeat themselves? No Do you experience ringing or noises in your ears? No Do you have difficulty understanding soft or whispered voices? No Vision:              Any change in vision: no             Up to date with eye exam: yes Memory:  Do you feel that you have a problem with memory? Yes, calls her daughter by granddaughter's name, some recall difficulty only  Do you often misplace items? No  Do you feel safe at home?  Yes  Cognitive Testing  [name], Orientated? Yes  Normal Appearance? Yes  Recall of three objects?  Yes  Can perform simple calculations? Yes  Displays appropriate judgment? Yes  Can read the correct time from a watch face? Yes   Advanced Directives have been discussed with the patient? Yes  Medications and allergies reviewed with patient and updated if appropriate.  Patient Active Problem List   Diagnosis Date Noted  . Diabetes ([place]) 02/15/2016  . Obesity 02/13/2016  . Light headedness 02/13/2016  . Left leg pain 11/28/2015  . Skin nodule 11/28/2015  . Migraine without aura and with status migrainosus, not intractable 10/03/2015  . Numbness of left hand 10/03/2015  . Cephalalgia 10/03/2015  . Hyperkalemia  09/21/2015  . Orthostatic hypotension 09/20/2013  . Abnormal ECG 09/11/2012  . Palpitations 09/08/2012  . Sciatica of left side   . Peripheral edema 05/09/2012  . FATIGUE 02/23/2010  . Obstructive sleep apnea 10/31/2009  . Morbid obesity ([place]) 10/17/2009  . HEMORRHOIDS, EXTERNAL 10/17/2009  . CONSTIPATION, CHRONIC 10/17/2009  . URTICARIA 03/11/2009  . Hyperlipidemia 01/12/2008  . Depression, major, recurrent ([place]) 01/12/2008  . Essential hypertension, benign 01/12/2008  . Allergic rhinitis 01/12/2008    Current Outpatient Prescriptions on File Prior to Visit  Medication Sig Dispense Refill  . clonazePAM (KLONOPIN) 1 MG tablet Take 1 mg by mouth 2 (two) times daily.    . fluticasone (FLONASE) 50 MCG/ACT nasal spray New Sig: Spray once into each nostril up to twice a day as needed for . 16 g 2  . furosemide (LASIX) 40 MG tablet Take 1 tablet (40 mg total) by mouth 2 (two) times daily. 180 tablet 3  . hydrocortisone (ANUSOL-HC) 2.5 % rectal cream Place rectally as needed. 30 g 3  . Linaclotide (LINZESS) 145 MCG CAPS capsule Take 1 capsule (145 mcg total) by mouth daily. 90 capsule 3  . Loratadine (CLARITIN PO) Take by mouth.    . meclizine (ANTIVERT) 25 MG tablet Take 1 tablet (25 mg total) by mouth 3 (three) times daily as needed for dizziness or nausea. 180 tablet 3  . metFORMIN (GLUCOPHAGE) 500 MG tablet Take 1 tablet (500 mg total) by mouth daily with breakfast. Enough until receive mail order 30 tablet 0  . omeprazole (PRILOSEC) 20 MG capsule Take 1 capsule (20 mg total) by mouth daily. 90 capsule 3  . potassium chloride SA (K-DUR,KLOR-CON) 20 MEQ tablet Take 1 tablet (20 mEq total) by mouth 2 (two) times daily. 180 tablet 3  . simvastatin (ZOCOR) 40 MG tablet Take 1 tablet (40 mg total) by mouth daily at 6 PM. 90 tablet 3  . SUMAtriptan-naproxen (TREXIMET) 85-500 MG tablet Take 1 tablet by mouth every 2 (two) hours as needed for migraine.    . tizanidine (ZANAFLEX) 2 MG capsule  Take 1 capsule (2 mg total) by mouth 3 (three) times daily as needed for muscle spasms. 270 capsule 1  . traMADol (ULTRAM) 50 MG tablet Take 1 tablet (50 mg total) by mouth every 8 (eight) hours as needed. 40 tablet 0  . atenolol (TENORMIN) 50 MG tablet Take 1 tablet (50 mg total) by mouth daily. 90 tablet 3  . [DISCONTINUED] oxycodone (OXY-IR) 5 MG capsule Take 5 mg by mouth every 4 (four) hours as needed. For pain.     No current facility-administered medications on file prior to visit.    Past Medical History  Diagnosis Date  . Hypertension   . ALLERGIC RHINITIS   . Sleep apnea     CPAP hs  . PAF (paroxysmal atrial fibrillation) ([place])  pt denies  . Dyslipidemia   . Panic attacks     Hx of depression  . Osteoarthritis   . Obesity   . Depression   . External hemorrhoids   . GERD (gastroesophageal reflux disease)   . Allergy   . Chronic kidney disease   . DDD (degenerative disc disease), lumbar     ESI with Ramos (spring 2016)    Past Surgical History  Procedure Laterality Date  . Replacement total knee Right   . Abdominal hysterectomy    . Right oophorectomy      No cancer  . Mass excision Left 12/30/2015    Procedure: EXCISION LEFT LEG MASS;  Surgeon: Donnie Mesa, MD;  Location: [facility];  Service: General;  Laterality: Left;    Social History   Social History  . Marital Status: Married    Spouse Name: N/A  . Number of Children: 3  . Years of Education: N/A   Occupational History  . disabled    Social History Main Topics  . Smoking status: Never Smoker   . Smokeless tobacco: Never Used  . Alcohol Use: 0.6 oz/week    1 Glasses of wine per week     Comment: one drink a month  . Drug Use: No  . Sexual Activity: Not Currently   Other Topics Concern  . Not on file   Social History Narrative   Married with children. Pt is on disablity. Previous worked in the school system    Family History  Problem Relation Age of Onset  . Heart  disease Mother   . Dementia Father   . Allergies Sister   . Asthma Son   . Breast cancer Maternal Aunt     cousin  . Asthma Other   . Colon cancer Cousin   . Heart disease Son     CHF  . Diabetes Mother   . Thyroid disease Mother   . Prostate cancer Maternal Uncle   . Kidney failure Father   . Prostate cancer Father   . Diabetes Son     Review of Systems  Constitutional: Positive for appetite change (decreased - due to metformin). Negative for fever and chills.  HENT: Negative for hearing loss and tinnitus.   Eyes: Negative for visual disturbance.  Respiratory: Negative for cough, shortness of breath and wheezing.   Cardiovascular: Positive for palpitations and leg swelling (controlled with lasix). Negative for chest pain.  Gastrointestinal: Positive for nausea. Negative for abdominal pain, diarrhea, constipation and blood in stool.       No gerd, increased gas  Genitourinary: Negative for dysuria and hematuria.  Musculoskeletal: Positive for back pain (chronic) and arthralgias (knee pain).  Skin: Negative for color change and rash.  Neurological: Negative for dizziness, light-headedness and headaches.  Psychiatric/Behavioral: Positive for dysphoric mood (following with psych) and agitation. Negative for suicidal ideas. The patient is nervous/anxious.        Objective:   Filed Vitals:   03/23/16 0832  BP: 126/86  Pulse: 70  Temp: 98.6 F (37 C)  Resp: 16   Filed Weights   03/23/16 0832  Weight: 256 lb (116.121 kg)   Body mass index is 40.09 kg/(m^2).   Physical Exam Constitutional: She appears well-developed and well-nourished. No distress.  HENT:  Head: Normocephalic and atraumatic.  Right Ear: External ear normal. Normal ear canal and TM Left Ear: External ear normal.  Normal ear canal and TM Mouth/Throat: Oropharynx is clear and moist.  Eyes: Conjunctivae  and EOM are normal.  Neck: Neck supple. No tracheal deviation present. No thyromegaly present.  No  carotid bruit  Cardiovascular: Normal rate, regular rhythm and normal heart sounds.   No murmur heard.  No edema. Pulmonary/Chest: Effort normal and breath sounds normal. No respiratory distress. She has no wheezes. She has no rales.  Breast: deferred to Gyn Abdominal: Soft. She exhibits no distension. There is no tenderness.  Lymphadenopathy: She has no cervical adenopathy.  Skin: Skin is warm and dry. She is not diaphoretic.  wound on left leg bandaged, which is dry, no surrounding erythema or swelling  Psychiatric: She has a normal mood and affect. Her behavior is normal.         Assessment & Plan:   Wellness Exam: Immunizations-Discussed shingles vaccine advised her to call her insurance company to see if it is covered, other immunizations up-to-date. No need for repeat couple at this time Colonoscopy  Up to date  Mammogram  Up to date  dexa-likely low risk, we will hold off for now Gyn  Up to date  Eye exam  Up to date  Hearing loss  - none Memory concerns/difficulties-mild recall difficulties, which I would consider normal. No other memory concerns Independent of ADLs-fully independent Stressed regular exercise-she will return to water exercise once her wound has healed   Patient received copy of preventative screening tests/immunizations recommended for the next 5-10 years.  See Problem List for Assessment and Plan of chronic medical problems.  Follow-up in 6 months, sooner if needed

## 2016-03-23 NOTE — Patient Instructions (Signed)
  [name] , Thank you for taking time to come for your Medicare Wellness Visit. I appreciate your ongoing commitment to your health goals. Please review the following plan we discussed and let me know if I can assist you in the future.   These are the goals we discussed: Goals    Continue to work on exercise, weight loss and improving diet      This is a list of the screening recommended for you and due dates:  Health Maintenance  Topic Date Due  . Eye exam for diabetics  11/28/1963  . Urine Protein Check  11/28/1963  . Pap Smear  08/11/2013  . Shingles Vaccine  11/27/2013  . Pneumococcal vaccine (2) 01/30/2014  . Flu Shot  05/11/2016  . Hemoglobin A1C  08/15/2016  . Mammogram  09/10/2016  . Complete foot exam   03/23/2017  . Colon Cancer Screening  05/08/2024  . Tetanus Vaccine  09/09/2024  .  Hepatitis C: One time screening is recommended by Center for Disease Control  (CDC) for  adults born from [birth year] through 1967.   Completed  . HIV Screening  Completed    Test(s) ordered today. Your results will be released to [place] (or called to you) after review, usually within 72hours after test completion. If any changes need to be made, you will be notified at that same time.  All other Health Maintenance issues reviewed.   All recommended immunizations and age-appropriate screenings are up-to-date or discussed.  No immunizations administered today.   Medications reviewed and updated.  Changes include trying to switch metformin to dinner time.   Your prescription(s) have been submitted to your pharmacy. Please take as directed and contact our office if you believe you are having problem(s) with the medication(s).  A referral was ordered for a nutritionist  Please followup in 6 months

## 2016-03-23 NOTE — Assessment & Plan Note (Signed)
BP well controlled Current regimen effective and well tolerated Continue current medications at current doses  

## 2016-03-23 NOTE — Assessment & Plan Note (Signed)
EKG today normal Palpitations most likely related to increased stress They have resolved and she will let me know if they return

## 2016-03-23 NOTE — Assessment & Plan Note (Signed)
Check A1c, urine microalbumin Advised to tell ophthalmologist that she is a diabetic Discussed the importance of compliance with a diabetic diet, weight loss and regular exercise Referred to nutritionist Prescription for glucometer Follow-up in 6 months, sooner if needed

## 2016-04-27 ENCOUNTER — Ambulatory Visit: Payer: Medicare Other

## 2016-05-04 ENCOUNTER — Ambulatory Visit: Payer: Medicare Other

## 2016-05-04 DIAGNOSIS — F410 Panic disorder [episodic paroxysmal anxiety] without agoraphobia: Secondary | ICD-10-CM | POA: Diagnosis not present

## 2016-05-04 DIAGNOSIS — F411 Generalized anxiety disorder: Secondary | ICD-10-CM | POA: Diagnosis not present

## 2016-05-04 DIAGNOSIS — F331 Major depressive disorder, recurrent, moderate: Secondary | ICD-10-CM | POA: Diagnosis not present

## 2016-05-07 ENCOUNTER — Other Ambulatory Visit (INDEPENDENT_AMBULATORY_CARE_PROVIDER_SITE_OTHER): Payer: Medicare Other

## 2016-05-07 ENCOUNTER — Ambulatory Visit (HOSPITAL_COMMUNITY)
Admission: RE | Admit: 2016-05-07 | Discharge: 2016-05-07 | Disposition: A | Discharge status: Home / Self Care | Payer: Medicare Other | Source: Ambulatory Visit | Attending: Internal Medicine | Admitting: Internal Medicine

## 2016-05-07 ENCOUNTER — Ambulatory Visit (INDEPENDENT_AMBULATORY_CARE_PROVIDER_SITE_OTHER): Payer: Medicare Other | Admitting: Internal Medicine

## 2016-05-07 ENCOUNTER — Encounter (HOSPITAL_COMMUNITY): Payer: Self-pay

## 2016-05-07 VITALS — BP 112/74 | HR 66 | Temp 98.3°F | Resp 16 | Wt 247.0 lb

## 2016-05-07 DIAGNOSIS — R1031 Right lower quadrant pain: Secondary | ICD-10-CM

## 2016-05-07 HISTORY — DX: Type 2 diabetes mellitus without complications: E11.9

## 2016-05-07 LAB — CBC WITH DIFFERENTIAL/PLATELET
Basophils Absolute: 0 10*3/uL (ref 0.0–0.1)
Basophils Relative: 0.4 % (ref 0.0–3.0)
Eosinophils Absolute: 0.1 10*3/uL (ref 0.0–0.7)
Eosinophils Relative: 1 % (ref 0.0–5.0)
HCT: 37.3 % (ref 36.0–46.0)
Hemoglobin: 12.4 g/dL (ref 12.0–15.0)
Lymphocytes Relative: 25.9 % (ref 12.0–46.0)
Lymphs Abs: 1.9 10*3/uL (ref 0.7–4.0)
MCHC: 33.1 g/dL (ref 30.0–36.0)
MCV: 83.9 fl (ref 78.0–100.0)
Monocytes Absolute: 0.5 10*3/uL (ref 0.1–1.0)
Monocytes Relative: 7.6 % (ref 3.0–12.0)
Neutro Abs: 4.7 10*3/uL (ref 1.4–7.7)
Neutrophils Relative %: 65.1 % (ref 43.0–77.0)
Platelets: 249 10*3/uL (ref 150.0–400.0)
RBC: 4.45 Mil/uL (ref 3.87–5.11)
RDW: 14.2 % (ref 11.5–15.5)
WBC: 7.2 10*3/uL (ref 4.0–10.5)

## 2016-05-07 LAB — COMPREHENSIVE METABOLIC PANEL
ALT: 9 U/L (ref 0–35)
AST: 12 U/L (ref 0–37)
Albumin: 4.1 g/dL (ref 3.5–5.2)
Alkaline Phosphatase: 132 U/L — ABNORMAL HIGH (ref 39–117)
BUN: 14 mg/dL (ref 6–23)
CO2: 32 mEq/L (ref 19–32)
Calcium: 9.8 mg/dL (ref 8.4–10.5)
Chloride: 104 mEq/L (ref 96–112)
Creatinine, Ser: 0.73 mg/dL (ref 0.40–1.20)
GFR: 103.74 mL/min (ref >60.00)
Glucose, Bld: 104 mg/dL — ABNORMAL HIGH (ref 70–99)
Potassium: 4.2 mEq/L (ref 3.5–5.1)
Sodium: 139 mEq/L (ref 135–145)
Total Bilirubin: 0.4 mg/dL (ref 0.2–1.2)
Total Protein: 7.5 g/dL (ref 6.0–8.3)

## 2016-05-07 LAB — URINALYSIS, ROUTINE W REFLEX MICROSCOPIC
Hgb urine dipstick: NEGATIVE
Ketones, ur: NEGATIVE
Leukocytes, UA: NEGATIVE
Nitrite: NEGATIVE
Specific Gravity, Urine: 1.02 (ref 1.000–1.030)
Urine Glucose: NEGATIVE
Urobilinogen, UA: 4 — AB (ref 0.0–1.0)
WBC, UA: NONE SEEN (ref 0–?)
pH: 6.5 (ref 5.0–8.0)

## 2016-05-07 MED ORDER — IOPAMIDOL (ISOVUE-300) INJECTION 61%
100.0000 mL | Freq: Once | INTRAVENOUS | Status: AC | PRN
  Administered 2016-05-07: 100 mL via INTRAVENOUS

## 2016-05-07 MED ORDER — DIATRIZOATE MEGLUMINE & SODIUM 66-10 % PO SOLN
30.0000 mL | Freq: Once | ORAL | Status: AC
  Administered 2016-05-07: 30 mL via ORAL

## 2016-05-07 NOTE — Progress Notes (Signed)
Pre visit review using our clinic review tool, if applicable. No additional management support is needed unless otherwise documented below in the visit note. 

## 2016-05-07 NOTE — Patient Instructions (Signed)
Have blood work and urine tests done.    Have the cat scan done as directed.  We will call you with the results and further instructions.

## 2016-05-07 NOTE — Progress Notes (Signed)
Subjective:    Patient ID: Erica [name], female    DOB: [birth year], 60 y.o.   MRN: [id number]  HPI She is here for an acute visit.   Nausea: she started having nausea intermittently for one month (? From metformin).   She has RLQ pain and it is intermittent.  The pain is severe at times.  Nothing makes the pain worse.  Taking a vicodin will help.  There is no change in the pain with activity, eating or having a bowel movement..  She has had some chills. Appetite is decreased, but she believes that is from the metformin. She thinks the nausea may be from the metformin. She has not had any known fever. She does feel constipated and denies blood in the stool. She denies any urinary symptoms including dysuria, increased frequency, hematuria. She has chronic back pain and that has not changed.   Last saw gyn last year. 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  Medications and allergies reviewed with patient and updated if appropriate.  Patient Active Problem List   Diagnosis Date Noted  . Diabetes ([place]) 02/15/2016  . Obesity 02/13/2016  . Left leg pain 11/28/2015  . Skin nodule 11/28/2015  . Migraine without aura and with status migrainosus, not intractable 10/03/2015  . Numbness of left hand 10/03/2015  . Cephalalgia 10/03/2015  . Abnormal ECG 09/11/2012  . Palpitations 09/08/2012  . Sciatica of left side   . Peripheral edema 05/09/2012  . FATIGUE 02/23/2010  . Obstructive sleep apnea 10/31/2009  . Morbid obesity ([place]) 10/17/2009  . HEMORRHOIDS, EXTERNAL 10/17/2009  . CONSTIPATION, CHRONIC 10/17/2009  . URTICARIA 03/11/2009  . Hyperlipidemia 01/12/2008  . Depression, major, recurrent ([place]) 01/12/2008  . Essential hypertension, benign 01/12/2008  . Allergic rhinitis 01/12/2008    Current Outpatient Prescriptions on File Prior to Visit  Medication Sig Dispense Refill  . atenolol (TENORMIN) 50 MG tablet Take 1 tablet (50 mg total) by mouth daily. 90  tablet 3  . blood glucose meter kit and supplies KIT Dispense based on patient and insurance preference. Use up to four times daily as directed. (FOR ICD-9 250.00, 250.01). 1 each 0  . clonazePAM (KLONOPIN) 1 MG tablet Take 1 mg by mouth 2 (two) times daily.    . fluticasone (FLONASE) 50 MCG/ACT nasal spray New Sig: Spray once into each nostril up to twice a day as needed for . 16 g 11  . furosemide (LASIX) 40 MG tablet Take 1 tablet (40 mg total) by mouth 2 (two) times daily. 180 tablet 3  . hydrocortisone (ANUSOL-HC) 2.5 % rectal cream Place rectally as needed. 30 g 3  . linaclotide (LINZESS) 145 MCG CAPS capsule Take 1 capsule (145 mcg total) by mouth daily. 90 capsule 3  . Loratadine (CLARITIN PO) Take by mouth.    . meclizine (ANTIVERT) 25 MG tablet Take 1 tablet (25 mg total) by mouth 3 (three) times daily as needed for dizziness or nausea. 180 tablet 3  . metFORMIN (GLUCOPHAGE) 500 MG tablet Take 1 tablet (500 mg total) by mouth daily with breakfast. Enough until receive mail order 30 tablet 0  . omeprazole (PRILOSEC) 20 MG capsule Take 1 capsule (20 mg total) by mouth daily. 90 capsule 3  . potassium chloride SA (K-DUR,KLOR-CON) 20 MEQ tablet Take 1 tablet (20 mEq total) by mouth 2 (two) times daily. 180 tablet 3  . simvastatin (ZOCOR) 40 MG tablet Take 1 tablet (40 mg total)  by mouth daily at 6 PM. 90 tablet 3  . SUMAtriptan-naproxen (TREXIMET) 85-500 MG tablet Take 1 tablet by mouth every 2 (two) hours as needed for migraine. 10 tablet 8  . tizanidine (ZANAFLEX) 2 MG capsule Take 1 capsule (2 mg total) by mouth 3 (three) times daily as needed for muscle spasms. 270 capsule 1  . traMADol (ULTRAM) 50 MG tablet Take 1 tablet (50 mg total) by mouth every 8 (eight) hours as needed. 40 tablet 0  . venlafaxine XR (EFFEXOR-XR) 150 MG 24 hr capsule Take 175 mg by mouth daily with breakfast.     . [DISCONTINUED] oxycodone (OXY-IR) 5 MG capsule Take 5 mg by mouth every 4 (four) hours as needed. For  pain.     No current facility-administered medications on file prior to visit.     Past Medical History:  Diagnosis Date  . ALLERGIC RHINITIS   . Allergy   . Chronic kidney disease   . DDD (degenerative disc disease), lumbar    ESI with Ramos (spring 2016)  . Depression   . Dyslipidemia   . External hemorrhoids   . GERD (gastroesophageal reflux disease)   . Hypertension   . Obesity   . Osteoarthritis   . PAF (paroxysmal atrial fibrillation) ([place])    pt denies  . Panic attacks    Hx of depression  . Sleep apnea    CPAP hs    Past Surgical History:  Procedure Laterality Date  . ABDOMINAL HYSTERECTOMY    . MASS EXCISION Left 12/30/2015   Procedure: EXCISION LEFT LEG MASS;  Surgeon: Donnie Mesa, MD;  Location: [facility];  Service: General;  Laterality: Left;  . REPLACEMENT TOTAL KNEE Right   . RIGHT OOPHORECTOMY     No cancer    Social History   Social History  . Marital status: Married    Spouse name: N/A  . Number of children: 3  . Years of education: N/A   Occupational History  . disabled    Social History Main Topics  . Smoking status: Never Smoker  . Smokeless tobacco: Never Used  . Alcohol use 0.6 oz/week    1 Glasses of wine per week     Comment: one drink a month  . Drug use: No  . Sexual activity: Not Currently   Other Topics Concern  . Not on file   Social History Narrative   Married with children. Pt is on disablity. Previous worked in the school system    Family History  Problem Relation Age of Onset  . Heart disease Mother   . Dementia Father   . Allergies Sister   . Asthma Son   . Breast cancer Maternal Aunt     cousin  . Asthma Other   . Colon cancer Cousin   . Heart disease Son     CHF  . Diabetes Mother   . Thyroid disease Mother   . Prostate cancer Maternal Uncle   . Kidney failure Father   . Prostate cancer Father   . Diabetes Son     Review of Systems  Constitutional: Positive for appetite change  (with metformin) and chills. Negative for fever.  Respiratory: Negative for cough, shortness of breath and wheezing.   Cardiovascular: Positive for palpitations. Negative for chest pain.  Gastrointestinal: Positive for abdominal pain, constipation (chronic, no change) and nausea. Negative for blood in stool, diarrhea and vomiting.       No gerd  Genitourinary: Negative for dysuria,  frequency, hematuria and urgency.  Musculoskeletal: Positive for back pain (chronic, no new pain).       Objective:   Vitals:   05/07/16 1126  BP: 112/74  Pulse: 66  Resp: 16  Temp: 98.3 F (36.8 C)   Filed Weights   05/07/16 1126  Weight: 247 lb (112 kg)   Body mass index is 38.69 kg/m.   Physical Exam  Constitutional: She appears well-developed and well-nourished. No distress (but is in pain at times).  Abdominal: Soft. She exhibits no distension (but obese) and no mass. There is tenderness (RLQ). There is rebound (mild ). There is no guarding.  hysterectomy scar across lower abdomen well healed  Skin: Skin is warm and dry. She is not diaphoretic.          Assessment & Plan:   See Problem List for Assessment and Plan of chronic medical problems.

## 2016-05-08 ENCOUNTER — Encounter: Payer: Self-pay | Admitting: Internal Medicine

## 2016-05-08 DIAGNOSIS — R1031 Right lower quadrant pain: Secondary | ICD-10-CM | POA: Insufficient documentation

## 2016-05-08 NOTE — Assessment & Plan Note (Signed)
Right lower quadrant pain with tenderness on exam We'll check blood work and urine Need to rule out appendicitis so we will get a CT scan today Femoral hernia is also a possibility-the CT scan may contact us or she may need to see surgery Management and follow-up depending on above results

## 2016-05-09 LAB — URINE CULTURE: Organism ID, Bacteria: 10000

## 2016-05-11 ENCOUNTER — Ambulatory Visit: Payer: Medicare Other

## 2016-05-12 ENCOUNTER — Telehealth: Payer: Self-pay

## 2016-05-12 NOTE — Telephone Encounter (Signed)
Patient called about lab results. I do not see where DR. Burns read them yet and made any notes. Please follow up once you have some information for her. Thank you.

## 2016-05-13 NOTE — Telephone Encounter (Signed)
Urine showed no infection.  Kidney and liver tests are normal. Routine blood counts are normal

## 2016-05-14 NOTE — Telephone Encounter (Signed)
Called patient and let me know the labs. They understood.

## 2016-06-10 DIAGNOSIS — Z471 Aftercare following joint replacement surgery: Secondary | ICD-10-CM | POA: Diagnosis not present

## 2016-06-10 DIAGNOSIS — Z96651 Presence of right artificial knee joint: Secondary | ICD-10-CM | POA: Diagnosis not present

## 2016-06-17 DIAGNOSIS — M5136 Other intervertebral disc degeneration, lumbar region: Secondary | ICD-10-CM | POA: Diagnosis not present

## 2016-06-28 DIAGNOSIS — M5136 Other intervertebral disc degeneration, lumbar region: Secondary | ICD-10-CM | POA: Diagnosis not present

## 2016-07-06 DIAGNOSIS — M5136 Other intervertebral disc degeneration, lumbar region: Secondary | ICD-10-CM | POA: Diagnosis not present

## 2016-07-06 DIAGNOSIS — M5410 Radiculopathy, site unspecified: Secondary | ICD-10-CM | POA: Diagnosis not present

## 2016-07-07 ENCOUNTER — Ambulatory Visit: Payer: Medicare Other | Admitting: Adult Health

## 2016-07-09 ENCOUNTER — Ambulatory Visit: Payer: Medicare Other | Admitting: Adult Health

## 2016-07-22 ENCOUNTER — Ambulatory Visit: Payer: Medicare Other | Admitting: Adult Health

## 2016-07-23 ENCOUNTER — Ambulatory Visit: Payer: Medicare Other | Admitting: Adult Health

## 2016-07-27 DIAGNOSIS — M5136 Other intervertebral disc degeneration, lumbar region: Secondary | ICD-10-CM | POA: Diagnosis not present

## 2016-08-03 DIAGNOSIS — F410 Panic disorder [episodic paroxysmal anxiety] without agoraphobia: Secondary | ICD-10-CM | POA: Diagnosis not present

## 2016-08-03 DIAGNOSIS — F411 Generalized anxiety disorder: Secondary | ICD-10-CM | POA: Diagnosis not present

## 2016-08-03 DIAGNOSIS — F331 Major depressive disorder, recurrent, moderate: Secondary | ICD-10-CM | POA: Diagnosis not present

## 2016-08-17 ENCOUNTER — Ambulatory Visit (INDEPENDENT_AMBULATORY_CARE_PROVIDER_SITE_OTHER): Payer: Medicare Other | Admitting: Internal Medicine

## 2016-08-17 VITALS — BP 140/82 | HR 88 | Resp 20 | Wt 241.0 lb

## 2016-08-17 DIAGNOSIS — R002 Palpitations: Secondary | ICD-10-CM | POA: Diagnosis not present

## 2016-08-17 DIAGNOSIS — M25571 Pain in right ankle and joints of right foot: Secondary | ICD-10-CM

## 2016-08-17 DIAGNOSIS — F4320 Adjustment disorder, unspecified: Secondary | ICD-10-CM | POA: Diagnosis not present

## 2016-08-17 DIAGNOSIS — R079 Chest pain, unspecified: Secondary | ICD-10-CM

## 2016-08-17 DIAGNOSIS — F4321 Adjustment disorder with depressed mood: Secondary | ICD-10-CM

## 2016-08-17 MED ORDER — ALPRAZOLAM 0.5 MG PO TABS: 0.5000 mg | ORAL_TABLET | Freq: Two times a day (BID) | ORAL | 1 refills | Status: DC | PRN

## 2016-08-17 NOTE — Progress Notes (Signed)
Subjective:    Patient ID: Erica [name], female    DOB: 1956-02-25, 60 y.o.   MRN: [id number]  HPI  Here to f/u with pt concern about ? DVT; c/o mild to mod sharp pain and swelling for 3-4 days to left lateral ankle and lateral foot, constant but worse to walk, better to sit, no other calf tender or swelling; no trauma or hx of gout, No hx of DVT.  Pt denies wheezing, orthopnea, PND, increased LE swelling, but has 2 days intermittent dull SSCP assoc with intermittent sob, but no radiation, n/v, diaphoresis, palp or syncope.  Pt denies new neurological symptoms such as new headache, or facial or extremity weakness or numbness  Denies worsening depressive symptoms, suicidal ideation, or panic; Asks for xanax refill due to ongoing anxiety and grief after son died 2 yrs ago. Past Medical History:  Diagnosis Date  . ALLERGIC RHINITIS   . Allergy   . Chronic kidney disease   . DDD (degenerative disc disease), lumbar    ESI with Erica [name] (spring 2016)  . Depression   . Diabetes mellitus without complication ([place])   . Dyslipidemia   . External hemorrhoids   . GERD (gastroesophageal reflux disease)   . Hypertension   . Obesity   . Osteoarthritis   . PAF (paroxysmal atrial fibrillation) ([place])    pt denies  . Panic attacks    Hx of depression  . Sleep apnea    CPAP hs   Past Surgical History:  Procedure Laterality Date  . ABDOMINAL HYSTERECTOMY    . MASS EXCISION Left 12/30/2015   Procedure: EXCISION LEFT LEG MASS;  Surgeon: Donnie Mesa, MD;  Location: [facility];  Service: General;  Laterality: Left;  . REPLACEMENT TOTAL KNEE Right   . RIGHT OOPHORECTOMY     No cancer    reports that she has never smoked. She has never used smokeless tobacco. She reports that she drinks about 0.6 oz of alcohol per week . She reports that she does not use drugs. family history includes Allergies in her sister; Asthma in her other and son; Breast cancer in her maternal aunt; Colon cancer  in her cousin; Dementia in her father; Diabetes in her mother and son; Heart disease in her mother and son; Kidney failure in her father; Prostate cancer in her father and maternal uncle; Thyroid disease in her mother. Allergies  Allergen Reactions  . Codeine Nausea And Vomiting  . Guaifenesin-Codeine     REACTION: feels spacey  . Wellbutrin [Bupropion] Palpitations    hallucinations   Current Outpatient Prescriptions on File Prior to Visit  Medication Sig Dispense Refill  . atenolol (TENORMIN) 50 MG tablet Take 1 tablet (50 mg total) by mouth daily. 90 tablet 3  . blood glucose meter kit and supplies KIT Dispense based on patient and insurance preference. Use up to four times daily as directed. (FOR ICD-9 250.00, 250.01). 1 each 0  . fluticasone (FLONASE) 50 MCG/ACT nasal spray New Sig: Spray once into each nostril up to twice a day as needed for . 16 g 11  . furosemide (LASIX) 40 MG tablet Take 1 tablet (40 mg total) by mouth 2 (two) times daily. 180 tablet 3  . hydrocortisone (ANUSOL-HC) 2.5 % rectal cream Place rectally as needed. 30 g 3  . linaclotide (LINZESS) 145 MCG CAPS capsule Take 1 capsule (145 mcg total) by mouth daily. 90 capsule 3  . Loratadine (CLARITIN PO) Take by mouth.    Marland Kitchen  meclizine (ANTIVERT) 25 MG tablet Take 1 tablet (25 mg total) by mouth 3 (three) times daily as needed for dizziness or nausea. 180 tablet 3  . metFORMIN (GLUCOPHAGE) 500 MG tablet Take 1 tablet (500 mg total) by mouth daily with breakfast. Enough until receive mail order 30 tablet 0  . omeprazole (PRILOSEC) 20 MG capsule Take 1 capsule (20 mg total) by mouth daily. 90 capsule 3  . potassium chloride SA (K-DUR,KLOR-CON) 20 MEQ tablet Take 1 tablet (20 mEq total) by mouth 2 (two) times daily. 180 tablet 3  . simvastatin (ZOCOR) 40 MG tablet Take 1 tablet (40 mg total) by mouth daily at 6 PM. 90 tablet 3  . SUMAtriptan-naproxen (TREXIMET) 85-500 MG tablet Take 1 tablet by mouth every 2 (two) hours as needed  for migraine. 10 tablet 8  . tizanidine (ZANAFLEX) 2 MG capsule Take 1 capsule (2 mg total) by mouth 3 (three) times daily as needed for muscle spasms. 270 capsule 1  . traMADol (ULTRAM) 50 MG tablet Take 1 tablet (50 mg total) by mouth every 8 (eight) hours as needed. 40 tablet 0  . venlafaxine XR (EFFEXOR-XR) 150 MG 24 hr capsule Take 175 mg by mouth daily with breakfast.     . [DISCONTINUED] oxycodone (OXY-IR) 5 MG capsule Take 5 mg by mouth every 4 (four) hours as needed. For pain.     No current facility-administered medications on file prior to visit.    Review of Systems  Constitutional: Negative for unusual diaphoresis or night sweats HENT: Negative for ear swelling or discharge Eyes: Negative for worsening visual haziness  Respiratory: Negative for choking and stridor.   Gastrointestinal: Negative for distension or worsening eructation Genitourinary: Negative for retention or change in urine volume.  Musculoskeletal: Negative for other MSK pain or swelling Skin: Negative for color change and worsening wound Neurological: Negative for tremors and numbness other than noted  Psychiatric/Behavioral: Negative for decreased concentration or agitation other than above   All other system neg per pt    Objective:   Physical Exam BP 140/82   Pulse 88   Resp 20   Wt 241 lb (109.3 kg)   SpO2 99%   BMI 37.75 kg/m  VS noted,  Constitutional: Pt appears in no apparent distress HENT: Head: NCAT.  Right Ear: External ear normal.  Left Ear: External ear normal.  Eyes: . Pupils are equal, round, and reactive to light. Conjunctivae and EOM are normal Neck: Normal range of motion. Neck supple.  Cardiovascular: Normal rate and regular rhythm.   Pulmonary/Chest: Effort normal and breath sounds without rales or wheezing.  RLE with 1+ tender/swelling with slight erythema along left lateral ankle tarsal tunnel, o/w neurovasc intact Neurological: Pt is [name]. Not confused , motor grossly  intact Skin: Skin is warm. No rash, no LE edema Psychiatric: Pt behavior is normal. No agitation. + grieving, nervous  ECG today I have personally interpreted Atrial  Rhythm  P:QRS - 1:1, Abnormal P axis, H Rate 88 Low voltage in limb leads.   -Combined atrial enlargement.   -Anterior infarct -age undetermined.     Assessment & Plan:

## 2016-08-17 NOTE — Patient Instructions (Signed)
Your EKG was Ok today  Please continue all other medications as before, and refills have been done if requested - the xanax  Please have the pharmacy call with any other refills you may need.  Please continue your efforts at being more active, low cholesterol diet, and weight control.  You are otherwise up to date with prevention measures today.  Please keep your appointments with your specialists as you may have planned  You will be contacted regarding the referral for: Dr Tamala Julian (or you can make an appt as you leave at the scheduling desk), and the stress test

## 2016-08-17 NOTE — Progress Notes (Signed)
Pre visit review using our clinic review tool, if applicable. No additional management support is needed unless otherwise documented below in the visit note. 

## 2016-08-18 NOTE — Progress Notes (Signed)
[facility] [address] [place], [place] [address] Phone: [phone] Subjective:    I'm seeing this patient by the request  of:  [name], MD Dr. Jenny Reichmann MD  CC:  Right foot and ankle pain  [id number]  Erica [name] is a 60 y.o. female coming in with complaint of right ankle pain. Patient states that this is been going on for 2 days. Asian states that she woke up with the pain. Made very difficult to even ambulate. Did not do any new activities. States most the pain seems to be on the medial aspect the ankle and radiate to her calf. Patient states that it feels a little bit swollen. Denies any warmth to touch. Denies any numbness. Patient does have a past medical history significant for back problems with radicular symptoms and has recently had some injections but states that this does not seem to be associated. Denies any significant weakness with it. Rates the severity to pain though is 8 out of 10.     Past Medical History:  Diagnosis Date  . ALLERGIC RHINITIS   . Allergy   . Chronic kidney disease   . DDD (degenerative disc disease), lumbar    ESI with Ramos (spring 2016)  . Depression   . Diabetes mellitus without complication ([place])   . Dyslipidemia   . External hemorrhoids   . GERD (gastroesophageal reflux disease)   . Hypertension   . Obesity   . Osteoarthritis   . PAF (paroxysmal atrial fibrillation) ([place])    pt denies  . Panic attacks    Hx of depression  . Sleep apnea    CPAP hs   Past Surgical History:  Procedure Laterality Date  . ABDOMINAL HYSTERECTOMY    . MASS EXCISION Left 12/30/2015   Procedure: EXCISION LEFT LEG MASS;  Surgeon: Donnie Mesa, MD;  Location: [facility];  Service: General;  Laterality: Left;  . REPLACEMENT TOTAL KNEE Right   . RIGHT OOPHORECTOMY     No cancer   Social History   Social History  . Marital status: Married    Spouse name: N/A  . Number of children: 3  . Years of  education: N/A   Occupational History  . disabled    Social History Main Topics  . Smoking status: Never Smoker  . Smokeless tobacco: Never Used  . Alcohol use 0.6 oz/week    1 Glasses of wine per week     Comment: one drink a month  . Drug use: No  . Sexual activity: Not Currently   Other Topics Concern  . None   Social History Narrative   Married with children. Pt is on disablity. Previous worked in the school system   Allergies  Allergen Reactions  . Codeine Nausea And Vomiting  . Guaifenesin-Codeine     REACTION: feels spacey  . Wellbutrin [Bupropion] Palpitations    hallucinations   Family History  Problem Relation Age of Onset  . Heart disease Mother   . Dementia Father   . Allergies Sister   . Asthma Son   . Breast cancer Maternal Aunt     cousin  . Asthma Other   . Colon cancer Cousin   . Heart disease Son     CHF  . Diabetes Mother   . Thyroid disease Mother   . Prostate cancer Maternal Uncle   . Kidney failure Father   . Prostate cancer Father   . Diabetes Son  Past medical history, social, surgical and family history all reviewed in electronic medical record.  No pertanent information unless stated regarding to the chief complaint.   Review of Systems:Review of systems updated and as accurate as of 08/19/16  No headache, visual changes, nausea, vomiting, diarrhea, constipation, dizziness, abdominal pain, skin rash, fevers, chills, night sweats, weight loss, swollen lymph nodes, body aches, joint swelling, muscle aches, chest pain, shortness of breath, mood changes.   Objective  Blood pressure 122/82, pulse 82, height 5\' 7"  (1.702 m), weight 241 lb (109.3 kg), SpO2 98 %. Systems examined below as of 08/19/16   General: No apparent distress alert and oriented x3 mood and affect normal, dressed appropriately.  HEENT: Pupils equal, extraocular movements intact  Respiratory: Patient's speak in full sentences and does not appear short of breath    Cardiovascular: Trace lower extremity edema, non tender, no erythema  Skin: Warm dry intact with no signs of infection or rash on extremities or on axial skeleton.  Abdomen: Soft nontender  Neuro: Cranial nerves II through XII are intact, neurovascularly intact in all extremities with 2+ DTRs and 2+ pulses.  Lymph: No lymphadenopathy of posterior or anterior cervical chain or axillae bilaterally.  Gait antalgic gait MSK:  Non tender with full range of motion and good stability and symmetric strength and tone of shoulders, elbows, wrist, hip, knee and bilaterally.  Ankle:Right Trace edema bilaterally right greater than the left Mild limitation in flexion and extension as well as eversion and inversion.  Strength is 4/5 in all directions. Seems symmetric to the contralateral side Stable lateral and medial ligaments; squeeze test and kleiger test unremarkable; Nontender over the bone. Patient though is tender to palpation 4 cm proximal to the medial malleolus. Mild positive pain with compression of the calf Able to walk 4 steps. Antalgic gait  Foot exam shows: Severe pes planus bilaterally with overpronation of the hindfoot. Patient does have hallux rigidus. Mild splaying between the first and second toes.  MSK US performed of: Right foot and ankle This study was ordered, performed, and interpreted by Charlann Boxer D.O.  Foot/Ankle:   All structures visualized.   Talar dome shell arthritic spurring Ankle mortise without effusion. Significant arthritic changes Posterior tibialis, flexor hallucis longus, and flexor digitorum longus tendons unremarkable on long and transverse views without sheath effusions. Achilles tendon visualized along length of tendon and unremarkable on long and transverse views without sheath effusion.  Power doppler signal normal.  IMPRESSION:  Arthritis of the foot and ankle     Impression and Recommendations:     This case required medical decision making of  moderate complexity.      Note: This dictation was prepared with Dragon dictation along with smaller phrase technology. Any transcriptional errors that result from this process are unintentional.

## 2016-08-19 ENCOUNTER — Ambulatory Visit (HOSPITAL_COMMUNITY)
Admission: RE | Admit: 2016-08-19 | Discharge: 2016-08-19 | Disposition: A | Discharge status: Home / Self Care | Payer: Medicare Other | Source: Ambulatory Visit | Attending: Family Medicine | Admitting: Family Medicine

## 2016-08-19 ENCOUNTER — Other Ambulatory Visit: Payer: Self-pay | Admitting: Family Medicine

## 2016-08-19 ENCOUNTER — Encounter (HOSPITAL_COMMUNITY): Payer: Self-pay

## 2016-08-19 ENCOUNTER — Ambulatory Visit: Payer: Self-pay

## 2016-08-19 ENCOUNTER — Encounter: Payer: Self-pay | Admitting: Family Medicine

## 2016-08-19 ENCOUNTER — Ambulatory Visit (INDEPENDENT_AMBULATORY_CARE_PROVIDER_SITE_OTHER): Payer: Medicare Other | Admitting: Family Medicine

## 2016-08-19 VITALS — BP 122/82 | HR 82 | Ht 67.0 in | Wt 241.0 lb

## 2016-08-19 DIAGNOSIS — R52 Pain, unspecified: Secondary | ICD-10-CM

## 2016-08-19 DIAGNOSIS — M25571 Pain in right ankle and joints of right foot: Secondary | ICD-10-CM

## 2016-08-19 DIAGNOSIS — M79609 Pain in unspecified limb: Secondary | ICD-10-CM | POA: Insufficient documentation

## 2016-08-19 NOTE — Assessment & Plan Note (Addendum)
Patient does have more of a right ankle pain. Discussed with patient at great length. I cannot rule out a deep venous thrombosis. Patient will be sent for Doppler today. We discussed bracing in case that this is a potential tendinitis or exacerbation of patient's underlying arthritis. Do not feel that x-rays would be necessary at this time. No pain over the medial malleolus itself. Patient will continue to try to weight-bear as tolerated. Patient was put in an Aircast. We discussed icing regimen. Patient will follow up closely in the next 2 weeks for further evaluation. Patient knows if any worsening symptoms or shortness of breath before her ultrasound to seek medical attention immediately.

## 2016-08-19 NOTE — Progress Notes (Signed)
*  Preliminary Results* Right lower extremity venous duplex completed. Right lower extremity is negative for deep vein thrombosis. There is no evidence of right Baker's cyst.  Patient mentioned being concerned about poor circulation to her right lower extremity. For patient peace of mind, I briefly interrogated the right distal posterior tibial artery and found it to be patent with multiphasic flow.  Preliminary results discussed with Dr. Tamala Julian.  08/19/2016 4:44 PM  Maudry Mayhew, BS, RVT, RDCS, RDMS

## 2016-08-19 NOTE — Patient Instructions (Signed)
Very  nice to meet you  Wear brace daily for the next 2 weeks Ice 20 minutes 2 times daily. Usually after activity and before bed. pennsaid pinkie amount topically 2 times daily as needed.  We will get the test to rule out clot today  See me  Again in 2 weeks and you should be better.  I think it is just the arthritis.

## 2016-08-23 DIAGNOSIS — F4321 Adjustment disorder with depressed mood: Secondary | ICD-10-CM | POA: Insufficient documentation

## 2016-08-23 DIAGNOSIS — R079 Chest pain, unspecified: Secondary | ICD-10-CM | POA: Insufficient documentation

## 2016-08-23 NOTE — Assessment & Plan Note (Signed)
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

## 2016-08-23 NOTE — Assessment & Plan Note (Signed)
Mild, ok for xanax refill, declines counselling referral,  to f/u any worsening symptoms or concerns, denies SI or HI

## 2016-08-23 NOTE — Assessment & Plan Note (Signed)
Atypical, etiology unclear, for stress test,  to f/u any worsening symptoms or concerns

## 2016-09-01 ENCOUNTER — Ambulatory Visit: Payer: Medicare Other | Admitting: Family Medicine

## 2016-09-20 ENCOUNTER — Telehealth (HOSPITAL_COMMUNITY): Payer: Self-pay | Admitting: *Deleted

## 2016-09-20 NOTE — Telephone Encounter (Signed)
Left message on voicemail in reference to upcoming appointment scheduled for 09/22/16. Phone number given for a call back so details instructions can be given. Erica [name], [place]e [place]

## 2016-09-21 ENCOUNTER — Telehealth (HOSPITAL_COMMUNITY): Payer: Self-pay | Admitting: *Deleted

## 2016-09-21 DIAGNOSIS — Z113 Encounter for screening for infections with a predominantly sexual mode of transmission: Secondary | ICD-10-CM | POA: Diagnosis not present

## 2016-09-21 DIAGNOSIS — Z1151 Encounter for screening for human papillomavirus (HPV): Secondary | ICD-10-CM | POA: Diagnosis not present

## 2016-09-21 DIAGNOSIS — Z1231 Encounter for screening mammogram for malignant neoplasm of breast: Secondary | ICD-10-CM | POA: Diagnosis not present

## 2016-09-21 DIAGNOSIS — Z124 Encounter for screening for malignant neoplasm of cervix: Secondary | ICD-10-CM | POA: Diagnosis not present

## 2016-09-22 ENCOUNTER — Ambulatory Visit (HOSPITAL_COMMUNITY): Payer: Medicare Other | Attending: Cardiology

## 2016-09-22 ENCOUNTER — Ambulatory Visit: Payer: Medicare Other | Admitting: Internal Medicine

## 2016-09-22 DIAGNOSIS — R079 Chest pain, unspecified: Secondary | ICD-10-CM | POA: Diagnosis not present

## 2016-09-22 DIAGNOSIS — R0602 Shortness of breath: Secondary | ICD-10-CM | POA: Insufficient documentation

## 2016-09-22 DIAGNOSIS — Z8249 Family history of ischemic heart disease and other diseases of the circulatory system: Secondary | ICD-10-CM | POA: Diagnosis not present

## 2016-09-22 MED ORDER — TECHNETIUM TC 99M TETROFOSMIN IV KIT
31.5000 | PACK | Freq: Once | INTRAVENOUS | Status: AC | PRN
  Administered 2016-09-22: 31.5 via INTRAVENOUS
  Filled 2016-09-22: qty 32

## 2016-09-22 MED ORDER — REGADENOSON 0.4 MG/5ML IV SOLN
0.4000 mg | Freq: Once | INTRAVENOUS | Status: AC
  Administered 2016-09-22: 0.4 mg via INTRAVENOUS

## 2016-09-23 ENCOUNTER — Ambulatory Visit (HOSPITAL_COMMUNITY): Payer: Medicare Other | Attending: Cardiology

## 2016-09-23 ENCOUNTER — Other Ambulatory Visit: Payer: Self-pay | Admitting: Internal Medicine

## 2016-09-23 DIAGNOSIS — R9439 Abnormal result of other cardiovascular function study: Secondary | ICD-10-CM

## 2016-09-23 LAB — MYOCARDIAL PERFUSION IMAGING
LV dias vol: 84 mL (ref 46–106)
LV sys vol: 39 mL
Peak HR: 114 {beats}/min
RATE: 0.25
Rest HR: 75 {beats}/min
SDS: 5
SRS: 1
SSS: 6
TID: 0.92

## 2016-09-23 MED ORDER — TECHNETIUM TC 99M TETROFOSMIN IV KIT
32.6000 | PACK | Freq: Once | INTRAVENOUS | Status: AC | PRN
  Administered 2016-09-23: 32.6 via INTRAVENOUS
  Filled 2016-09-23: qty 33

## 2016-09-27 ENCOUNTER — Ambulatory Visit (INDEPENDENT_AMBULATORY_CARE_PROVIDER_SITE_OTHER): Payer: Medicare Other | Admitting: Internal Medicine

## 2016-09-27 ENCOUNTER — Encounter: Payer: Self-pay | Admitting: Internal Medicine

## 2016-09-27 ENCOUNTER — Other Ambulatory Visit (INDEPENDENT_AMBULATORY_CARE_PROVIDER_SITE_OTHER): Payer: Medicare Other

## 2016-09-27 VITALS — BP 122/78 | HR 83 | Temp 99.0°F | Resp 16 | Wt 250.0 lb

## 2016-09-27 DIAGNOSIS — F410 Panic disorder [episodic paroxysmal anxiety] without agoraphobia: Secondary | ICD-10-CM

## 2016-09-27 DIAGNOSIS — Z23 Encounter for immunization: Secondary | ICD-10-CM

## 2016-09-27 DIAGNOSIS — I10 Essential (primary) hypertension: Secondary | ICD-10-CM | POA: Diagnosis not present

## 2016-09-27 DIAGNOSIS — F339 Major depressive disorder, recurrent, unspecified: Secondary | ICD-10-CM

## 2016-09-27 DIAGNOSIS — E119 Type 2 diabetes mellitus without complications: Secondary | ICD-10-CM | POA: Diagnosis not present

## 2016-09-27 DIAGNOSIS — K219 Gastro-esophageal reflux disease without esophagitis: Secondary | ICD-10-CM | POA: Insufficient documentation

## 2016-09-27 DIAGNOSIS — R002 Palpitations: Secondary | ICD-10-CM

## 2016-09-27 DIAGNOSIS — E7800 Pure hypercholesterolemia, unspecified: Secondary | ICD-10-CM

## 2016-09-27 LAB — COMPREHENSIVE METABOLIC PANEL
ALT: 8 U/L (ref 0–35)
AST: 12 U/L (ref 0–37)
Albumin: 3.9 g/dL (ref 3.5–5.2)
Alkaline Phosphatase: 142 U/L — ABNORMAL HIGH (ref 39–117)
BUN: 16 mg/dL (ref 6–23)
CO2: 28 mEq/L (ref 19–32)
Calcium: 9.3 mg/dL (ref 8.4–10.5)
Chloride: 106 mEq/L (ref 96–112)
Creatinine, Ser: 0.66 mg/dL (ref 0.40–1.20)
GFR: 116.39 mL/min (ref >60.00)
Glucose, Bld: 92 mg/dL (ref 70–99)
Potassium: 3.9 mEq/L (ref 3.5–5.1)
Sodium: 142 mEq/L (ref 135–145)
Total Bilirubin: 0.3 mg/dL (ref 0.2–1.2)
Total Protein: 7.2 g/dL (ref 6.0–8.3)

## 2016-09-27 LAB — LIPID PANEL
Cholesterol: 184 mg/dL (ref 0–200)
HDL: 44.2 mg/dL (ref >39.00)
LDL Cholesterol: 100 mg/dL — ABNORMAL HIGH (ref 0–99)
NonHDL: 139.49
Total CHOL/HDL Ratio: 4
Triglycerides: 195 mg/dL — ABNORMAL HIGH (ref 0.0–149.0)
VLDL: 39 mg/dL (ref 0.0–40.0)

## 2016-09-27 LAB — HEMOGLOBIN A1C: Hgb A1c MFr Bld: 6.2 % (ref 4.6–6.5)

## 2016-09-27 LAB — TSH: TSH: 2.39 u[IU]/mL (ref 0.35–4.50)

## 2016-09-27 MED ORDER — METFORMIN HCL 500 MG PO TABS: 500.0000 mg | ORAL_TABLET | Freq: Every day | ORAL | 1 refills | Status: DC

## 2016-09-27 MED ORDER — FUROSEMIDE 40 MG PO TABS: 40.0000 mg | ORAL_TABLET | Freq: Two times a day (BID) | ORAL | 3 refills | Status: DC

## 2016-09-27 NOTE — Assessment & Plan Note (Addendum)
Taking metformin daily Eating too many sweets Not exercising Gained weight  Check a1c Stressed lifestyle changes

## 2016-09-27 NOTE — Assessment & Plan Note (Signed)
GERD controlled Continue daily medication  

## 2016-09-27 NOTE — Assessment & Plan Note (Signed)
BP well controlled Current regimen effective and well tolerated Continue current medications at current doses cmp  

## 2016-09-27 NOTE — Assessment & Plan Note (Signed)
Takes xanax as needed

## 2016-09-27 NOTE — Assessment & Plan Note (Signed)
Saw Dr Jenny Reichmann Had stress test - abnormal Referred to cardiology

## 2016-09-27 NOTE — Assessment & Plan Note (Signed)
Management per psych 

## 2016-09-27 NOTE — Patient Instructions (Signed)

## 2016-09-27 NOTE — Assessment & Plan Note (Signed)
Check lipid panel  Continue daily statin Regular exercise and healthy diet encouraged  

## 2016-09-27 NOTE — Progress Notes (Signed)
Subjective:    Patient ID: Erica [name], female    DOB: Aug 04, 1956, 60 y.o.   MRN: [id number]  HPI The patient is here for follow up.  Diabetes: She is taking her medication daily as prescribed. She is not compliant with a diabetic diet. She is not exercising regularly. She monitors her sugars and they have been running 95, low 100's. She checks her feet daily and denies foot lesions. She is up-to-date with an ophthalmology examination.   Hypertension: She is taking her medication daily. She is compliant with a low sodium diet.  She denies chest pain, shortness of breath and regular headaches. She is not exercising regularly.  She does not monitor her blood pressure at home.    Hyperlipidemia: She is taking her medication daily. She is compliant with a low fat/cholesterol diet. She is not exercising regularly. She denies myalgias.   GERD:  She is taking her medication daily as prescribed.  She denies any GERD symptoms and feels her GERD is well controlled.   Anxiety: She is taking her medication daily as prescribed. The effexor has caused weight gain.  She takes xanax as needed.  She denies any side effects from the medication. She feels her anxiety is well controlled and she is happy with her current dose of medication.    Medications and allergies reviewed with patient and updated if appropriate.  Patient Active Problem List   Diagnosis Date Noted  . Chest pain 08/23/2016  . Grief reaction 08/23/2016  . Right ankle pain 08/19/2016  . RLQ abdominal pain 05/08/2016  . Diabetes ([place]) 02/15/2016  . Obesity 02/13/2016  . Left leg pain 11/28/2015  . Skin nodule 11/28/2015  . Migraine without aura and with status migrainosus, not intractable 10/03/2015  . Numbness of left hand 10/03/2015  . Cephalalgia 10/03/2015  . Abnormal ECG 09/11/2012  . Palpitations 09/08/2012  . Sciatica of left side   . Peripheral edema 05/09/2012  . FATIGUE 02/23/2010  . Obstructive sleep apnea  10/31/2009  . Morbid obesity ([place]) 10/17/2009  . HEMORRHOIDS, EXTERNAL 10/17/2009  . CONSTIPATION, CHRONIC 10/17/2009  . URTICARIA 03/11/2009  . Hyperlipidemia 01/12/2008  . Depression, major, recurrent ([place]) 01/12/2008  . Essential hypertension, benign 01/12/2008  . Allergic rhinitis 01/12/2008    Current Outpatient Prescriptions on File Prior to Visit  Medication Sig Dispense Refill  . ALPRAZolam (XANAX) 0.5 MG tablet Take 1 tablet (0.5 mg total) by mouth 2 (two) times daily as needed for anxiety. 60 tablet 1  . atenolol (TENORMIN) 50 MG tablet Take 1 tablet (50 mg total) by mouth daily. 90 tablet 3  . blood glucose meter kit and supplies KIT Dispense based on patient and insurance preference. Use up to four times daily as directed. (FOR ICD-9 250.00, 250.01). 1 each 0  . fluticasone (FLONASE) 50 MCG/ACT nasal spray New Sig: Spray once into each nostril up to twice a day as needed for . 16 g 11  . furosemide (LASIX) 40 MG tablet Take 1 tablet (40 mg total) by mouth 2 (two) times daily. 180 tablet 3  . hydrocortisone (ANUSOL-HC) 2.5 % rectal cream Place rectally as needed. 30 g 3  . linaclotide (LINZESS) 145 MCG CAPS capsule Take 1 capsule (145 mcg total) by mouth daily. 90 capsule 3  . Loratadine (CLARITIN PO) Take by mouth.    . meclizine (ANTIVERT) 25 MG tablet Take 1 tablet (25 mg total) by mouth 3 (three) times daily as needed for dizziness or nausea. [place]  tablet 3  . metFORMIN (GLUCOPHAGE) 500 MG tablet Take 1 tablet (500 mg total) by mouth daily with breakfast. Enough until receive mail order 30 tablet 0  . omeprazole (PRILOSEC) 20 MG capsule Take 1 capsule (20 mg total) by mouth daily. 90 capsule 3  . potassium chloride SA (K-DUR,KLOR-CON) 20 MEQ tablet Take 1 tablet (20 mEq total) by mouth 2 (two) times daily. 180 tablet 3  . simvastatin (ZOCOR) 40 MG tablet Take 1 tablet (40 mg total) by mouth daily at 6 PM. 90 tablet 3  . SUMAtriptan-naproxen (TREXIMET) 85-500 MG tablet Take 1  tablet by mouth every 2 (two) hours as needed for migraine. 10 tablet 8  . tizanidine (ZANAFLEX) 2 MG capsule Take 1 capsule (2 mg total) by mouth 3 (three) times daily as needed for muscle spasms. 270 capsule 1  . traMADol (ULTRAM) 50 MG tablet Take 1 tablet (50 mg total) by mouth every 8 (eight) hours as needed. 40 tablet 0  . venlafaxine XR (EFFEXOR-XR) 150 MG 24 hr capsule Take 175 mg by mouth daily with breakfast.     . [DISCONTINUED] oxycodone (OXY-IR) 5 MG capsule Take 5 mg by mouth every 4 (four) hours as needed. For pain.     No current facility-administered medications on file prior to visit.     Past Medical History:  Diagnosis Date  . ALLERGIC RHINITIS   . Allergy   . Chronic kidney disease   . DDD (degenerative disc disease), lumbar    ESI with Ramos (spring 2016)  . Depression   . Diabetes mellitus without complication ([place])   . Dyslipidemia   . External hemorrhoids   . GERD (gastroesophageal reflux disease)   . Hypertension   . Obesity   . Osteoarthritis   . PAF (paroxysmal atrial fibrillation) ([place])    pt denies  . Panic attacks    Hx of depression  . Sleep apnea    CPAP hs    Past Surgical History:  Procedure Laterality Date  . ABDOMINAL HYSTERECTOMY    . MASS EXCISION Left 12/30/2015   Procedure: EXCISION LEFT LEG MASS;  Surgeon: Donnie Mesa, MD;  Location: [facility];  Service: General;  Laterality: Left;  . REPLACEMENT TOTAL KNEE Right   . RIGHT OOPHORECTOMY     No cancer    Social History   Social History  . Marital status: Married    Spouse name: N/A  . Number of children: 3  . Years of education: N/A   Occupational History  . disabled    Social History Main Topics  . Smoking status: Never Smoker  . Smokeless tobacco: Never Used  . Alcohol use 0.6 oz/week    1 Glasses of wine per week     Comment: one drink a month  . Drug use: No  . Sexual activity: Not Currently   Other Topics Concern  . None   Social History  Narrative   Married with children. Pt is on disablity. Previous worked in the school system    Family History  Problem Relation Age of Onset  . Heart disease Mother   . Dementia Father   . Allergies Sister   . Asthma Son   . Breast cancer Maternal Aunt     cousin  . Asthma Other   . Colon cancer Cousin   . Heart disease Son     CHF  . Diabetes Mother   . Thyroid disease Mother   . Prostate cancer Maternal Uncle   .  Kidney failure Father   . Prostate cancer Father   . Diabetes Son     Review of Systems  Constitutional: Negative for fever.  Respiratory: Negative for cough, shortness of breath and wheezing.   Cardiovascular: Positive for chest pain (sting pain last night - lasted one second), palpitations (had stress test) and leg swelling (mild).  Neurological: Positive for light-headedness (mild, occ) and headaches. Negative for dizziness and numbness.       Objective:   Vitals:   09/27/16 1515  BP: 122/78  Pulse: 83  Resp: 16  Temp: 99 F (37.2 C)   Filed Weights   09/27/16 1515  Weight: 250 lb (113.4 kg)   Body mass index is 39.16 kg/m.   Physical Exam    Constitutional: Appears well-developed and well-nourished. No distress.  HENT:  Head: Normocephalic and atraumatic.  Neck: Neck supple. No tracheal deviation present. No thyromegaly present.  No cervical lymphadenopathy Cardiovascular: Normal rate, regular rhythm and normal heart sounds.   No murmur heard. No carotid bruit .  No edema Pulmonary/Chest: Effort normal and breath sounds normal. No respiratory distress. No has no wheezes. No rales.  Skin: Skin is warm and dry. Not diaphoretic.  Psychiatric: Normal mood and affect. Behavior is normal.      Assessment & Plan:    See Problem List for Assessment and Plan of chronic medical problems.

## 2016-09-27 NOTE — Progress Notes (Signed)
Pre visit review using our clinic review tool, if applicable. No additional management support is needed unless otherwise documented below in the visit note. 

## 2016-09-30 ENCOUNTER — Encounter: Payer: Self-pay | Admitting: Internal Medicine

## 2016-10-04 ENCOUNTER — Encounter: Payer: Self-pay | Admitting: Cardiology

## 2016-10-04 NOTE — Progress Notes (Signed)
Cardiology Office Note   Date:  10/05/2016   ID:  Erica [name], DOB 11-24-1955, MRN [id number]  PCP:  [name], MD  Cardiologist:   Minus Breeding, MD  Referring:  [name], MD  Chief Complaint  Patient presents with  . Palpitations      History of Present Illness: Erica [name] is a 60 y.o. female who presents for evaluation of an abnormal stress test.  This was ordered by Dr. Jenny Reichmann for for evaluation of chest pain.  She previously saw Dr Johnsie Cancel several years ago and had normal echo a  Lexiscan Myoview demonstrated a mild apical defect that appeared to be attenuation artifact.  She had an event monitor.  This demonstrated artifact.  She saw Dr. Lovena Le and he thought symptoms were related to sinus tachycardia.  .  The chest pain ordered by Dr. Jenny Reichmann demonstrates a small defect of mild severity present in the basal inferoseptal and mid inferoseptal location. consistent with ischemia.    She comes today to discuss this and actually doesn't describe any chest pain with me. Instead she is describing palpitations. This is similar to what she had before. Seems to happen mostly at night. She'll feel a racing. It might last for up to an hour although she can't tell me the last really just long. She has to take Xanax. She does have a lot of stress and anxiety. She takes care of her husband has had strokes. She does get some nausea with this. She might get short of breath. These are similar symptoms she had before. She's able to do vacuuming in her household chores without bringing on any chest pressure, neck or arm discomfort. She doesn't describe any PND or orthopnea. He said no presyncope or syncope.   Past Medical History:  Diagnosis Date  . ALLERGIC RHINITIS   . Chronic kidney disease   . DDD (degenerative disc disease), lumbar    ESI with Ramos (spring 2016)  . Depression   . Diabetes mellitus without complication ([place])   . Dyslipidemia   . External hemorrhoids   .  GERD (gastroesophageal reflux disease)   . Hypertension   . Obesity   . Osteoarthritis   . PAF (paroxysmal atrial fibrillation) ([place])    pt denies  . Panic attacks    Hx of depression  . Sleep apnea    CPAP hs    Past Surgical History:  Procedure Laterality Date  . ABDOMINAL HYSTERECTOMY    . MASS EXCISION Left 12/30/2015   Procedure: EXCISION LEFT LEG MASS;  Surgeon: Donnie Mesa, MD;  Location: [facility];  Service: General;  Laterality: Left;  . REPLACEMENT TOTAL KNEE Right   . RIGHT OOPHORECTOMY     No cancer     Current Outpatient Prescriptions  Medication Sig Dispense Refill  . ALPRAZolam (XANAX) 0.5 MG tablet Take 1 tablet (0.5 mg total) by mouth 2 (two) times daily as needed for anxiety. 60 tablet 1  . atenolol (TENORMIN) 50 MG tablet Take 1 tablet (50 mg total) by mouth daily. 90 tablet 3  . blood glucose meter kit and supplies KIT Dispense based on patient and insurance preference. Use up to four times daily as directed. (FOR ICD-9 250.00, 250.01). 1 each 0  . fluticasone (FLONASE) 50 MCG/ACT nasal spray New Sig: Spray once into each nostril up to twice a day as needed for . 16 g 11  . furosemide (LASIX) 40 MG tablet Take 1 tablet (  40 mg total) by mouth 2 (two) times daily. 180 tablet 3  . hydrocortisone (ANUSOL-HC) 2.5 % rectal cream Place rectally as needed. 30 g 3  . linaclotide (LINZESS) 145 MCG CAPS capsule Take 1 capsule (145 mcg total) by mouth daily. 90 capsule 3  . Loratadine (CLARITIN PO) Take by mouth.    . meclizine (ANTIVERT) 25 MG tablet Take 1 tablet (25 mg total) by mouth 3 (three) times daily as needed for dizziness or nausea. 180 tablet 3  . metFORMIN (GLUCOPHAGE) 500 MG tablet Take 1 tablet (500 mg total) by mouth daily with breakfast. 90 tablet 1  . omeprazole (PRILOSEC) 20 MG capsule Take 1 capsule (20 mg total) by mouth daily. 90 capsule 3  . potassium chloride SA (K-DUR,KLOR-CON) 20 MEQ tablet Take 1 tablet (20 mEq total) by mouth 2  (two) times daily. 180 tablet 3  . simvastatin (ZOCOR) 40 MG tablet Take 1 tablet (40 mg total) by mouth daily at 6 PM. 90 tablet 3  . SUMAtriptan-naproxen (TREXIMET) 85-500 MG tablet Take 1 tablet by mouth every 2 (two) hours as needed for migraine. 10 tablet 8  . tizanidine (ZANAFLEX) 2 MG capsule Take 1 capsule (2 mg total) by mouth 3 (three) times daily as needed for muscle spasms. 270 capsule 1  . traMADol (ULTRAM) 50 MG tablet Take 1 tablet (50 mg total) by mouth every 8 (eight) hours as needed. 40 tablet 0  . venlafaxine XR (EFFEXOR-XR) 150 MG 24 hr capsule Take 175 mg by mouth daily with breakfast.      No current facility-administered medications for this visit.     Allergies:   Codeine; Guaifenesin-codeine; and Wellbutrin [bupropion]    Social History:  The patient  reports that she has never smoked. She has never used smokeless tobacco. She reports that she drinks about 0.6 oz of alcohol per week . She reports that she does not use drugs.   Family History:  The patient's family history includes Allergies in her sister; Asthma in her other and son; Breast cancer in her maternal aunt; Colon cancer in her cousin; Dementia in her father; Diabetes in her mother and son; Heart disease in her mother and son; Kidney failure in her father; Prostate cancer in her father and maternal uncle; Thyroid disease in her mother.    ROS:  Please see the history of present illness.   Otherwise, review of systems are positive for none.   All other systems are reviewed and negative.    PHYSICAL EXAM: VS:  BP 122/84 (BP Location: Left Arm, Patient Position: Sitting, Cuff Size: Large)   Pulse 76   Ht _0  (1.676 m)   Wt 254 lb (115.2 kg)   SpO2 96%   BMI 41.00 kg/m  , BMI Body mass index is 41 kg/m. GENERAL:  Well appearing HEENT:  Pupils equal round and reactive, fundi not visualized, oral mucosa unremarkable NECK:  No jugular venous distention, waveform within normal limits, carotid upstroke  brisk and symmetric, no bruits, no thyromegaly LYMPHATICS:  No cervical, inguinal adenopathy LUNGS:  Clear to auscultation bilaterally BACK:  No CVA tenderness CHEST:  Unremarkable HEART:  PMI not displaced or sustained,S1 and S2 within normal limits, no S3, no S4, no clicks, no rubs, no murmurs ABD:  Flat, positive bowel sounds normal in frequency in pitch, no bruits, no rebound, no guarding, no midline pulsatile mass, no hepatomegaly, no splenomegaly EXT:  2 plus pulses throughout, no edema, no cyanosis no clubbing SKIN:  No rashes no nodules NEURO:  Cranial nerves II through XII grossly intact, motor grossly intact throughout PSYCH:  Cognitively intact, oriented to person place and time    EKG:  EKG is not ordered today. The ekg ordered 08/17/16 demonstrates sinus rhythm, rate 88, left atrial enlargement, poor anterior R wave   Recent Labs: 05/07/2016: Hemoglobin 12.4; Platelets 249.0 09/27/2016: ALT 8; BUN 16; Creatinine, Ser 0.66; Potassium 3.9; Sodium 142; TSH 2.39    Lipid Panel    Component Value Date/Time   CHOL 184 09/27/2016 1608   TRIG 195.0 (H) 09/27/2016 1608   TRIG 51 09/20/2006 0915   HDL 44.20 09/27/2016 1608   CHOLHDL 4 09/27/2016 1608   VLDL 39.0 09/27/2016 1608   LDLCALC 100 (H) 09/27/2016 1608      Wt Readings from Last 3 Encounters:  10/05/16 254 lb (115.2 kg)  09/27/16 250 lb (113.4 kg)  09/22/16 241 lb (109.3 kg)      Other studies Reviewed: Additional studies/ records that were reviewed today include: Leane Call, office notes and labs. Review of the above records demonstrates:  Please see elsewhere in the note.     ASSESSMENT AND PLAN:  PALPITATIONS: Given her previous workup with an absence of significant dysrhythmias I'm going to start just by adding an increased dose of atenolol in the evening. She actually takes her dose 25 mg twice a day and she's going to start taking 25 mg in the morning and 50 mg in the evening. She can come back  in one month to see if this is changed her symptoms.  Of note she had a recent normal TSH.    ABNORMAL STRESS TEST:  This was mildly abnormal results similar to the previous. She's not having any chest pain. She doesn't have significant risk factors. At this point I don't think further cardiovascular testing is suggested.  ABNORMAL EKG:  Her EKG is changed from previous with poor anterior R wave motion and LAE suggested.  I will check an echocardiogram.     Current medicines are reviewed at length with the patient today.  The patient does not have concerns regarding medicines.  The following changes have been made:  As above  Labs/ tests ordered today include:   None  Orders Placed This Encounter  Procedures  . ECHOCARDIOGRAM COMPLETE     Disposition:   FU with APP in one month    Signed, Minus Breeding, MD  10/05/2016 8:22 AM    [facility]

## 2016-10-05 ENCOUNTER — Ambulatory Visit (INDEPENDENT_AMBULATORY_CARE_PROVIDER_SITE_OTHER): Payer: Medicare Other | Admitting: Cardiology

## 2016-10-05 ENCOUNTER — Encounter: Payer: Self-pay | Admitting: Cardiology

## 2016-10-05 VITALS — BP 122/84 | HR 76 | Ht 66.0 in | Wt 254.0 lb

## 2016-10-05 DIAGNOSIS — R9431 Abnormal electrocardiogram [ECG] [EKG]: Secondary | ICD-10-CM | POA: Diagnosis not present

## 2016-10-05 DIAGNOSIS — R9430 Abnormal result of cardiovascular function study, unspecified: Secondary | ICD-10-CM

## 2016-10-05 DIAGNOSIS — R002 Palpitations: Secondary | ICD-10-CM

## 2016-10-05 NOTE — Telephone Encounter (Signed)
Left message on voicemail in reference to upcoming appointment scheduled for 09/22/16. Phone number given for a call back so details instructions can be given. Freddi Schrager, Ranae Palms

## 2016-10-05 NOTE — Patient Instructions (Addendum)
Medication Instructions:  Take 1/2 tablets of Atenolol in the morning and 1 tablets in the evening at 8:00 pm  Labwork: None Ordered  Testing/Procedures: Your physician has requested that you have an echocardiogram. Echocardiography is a painless test that uses sound waves to create images of your heart. It provides your doctor with information about the size and shape of your heart and how well your heart's chambers and valves are working. This procedure takes approximately one hour. There are no restrictions for this procedure.   Follow-Up: Your physician recommends that you schedule a follow-up appointment in: 1 Month with APP   Any Other Special Instructions Will Be Listed Below (If Applicable).        [place]   If you need a refill on your cardiac medications before your next appointment, please call your pharmacy.

## 2016-10-21 ENCOUNTER — Ambulatory Visit (HOSPITAL_COMMUNITY): Payer: Medicare Other | Attending: Cardiovascular Disease

## 2016-10-21 ENCOUNTER — Other Ambulatory Visit: Payer: Self-pay

## 2016-10-21 DIAGNOSIS — E119 Type 2 diabetes mellitus without complications: Secondary | ICD-10-CM | POA: Insufficient documentation

## 2016-10-21 DIAGNOSIS — Z6841 Body Mass Index (BMI) 40.0 and over, adult: Secondary | ICD-10-CM | POA: Diagnosis not present

## 2016-10-21 DIAGNOSIS — R9431 Abnormal electrocardiogram [ECG] [EKG]: Secondary | ICD-10-CM | POA: Diagnosis not present

## 2016-10-21 DIAGNOSIS — E785 Hyperlipidemia, unspecified: Secondary | ICD-10-CM | POA: Diagnosis not present

## 2016-10-21 DIAGNOSIS — I10 Essential (primary) hypertension: Secondary | ICD-10-CM | POA: Diagnosis not present

## 2016-10-21 DIAGNOSIS — I480 Paroxysmal atrial fibrillation: Secondary | ICD-10-CM | POA: Diagnosis not present

## 2016-11-04 DIAGNOSIS — F411 Generalized anxiety disorder: Secondary | ICD-10-CM | POA: Diagnosis not present

## 2016-11-04 DIAGNOSIS — F410 Panic disorder [episodic paroxysmal anxiety] without agoraphobia: Secondary | ICD-10-CM | POA: Diagnosis not present

## 2016-11-04 DIAGNOSIS — F331 Major depressive disorder, recurrent, moderate: Secondary | ICD-10-CM | POA: Diagnosis not present

## 2016-11-08 ENCOUNTER — Ambulatory Visit: Payer: Medicare Other | Admitting: Cardiology

## 2016-11-10 ENCOUNTER — Encounter: Payer: Self-pay | Admitting: Student

## 2016-11-10 NOTE — Progress Notes (Signed)
Cardiology Office Note    Date:  11/11/2016   ID:  Erica [name], DOB 1955-11-10, MRN [id number]  PCP:  [name], MD  Cardiologist: Dr. Percival Spanish  Chief Complaint  Patient presents with  . Follow-up    1 month;     History of Present Illness:    Erica [name] is a 61 y.o. female with past medical history of HTN, HLD, GERD, and palpitations (thought to be secondary to sinus tachycardia) who presents to the office today for follow-up.  Was last seen by Dr. Percival Spanish in 09/2016 for evaluation of an abnormal stress test which showed a small defect of mild severity present in the basal inferoseptal and mid inferoseptal location, consistent with ischemia. At the time of her visit, she denied any chest pain but did report having palpitations. Atenolol was increased from 44m BID to 217mAM/5088m. Her stress test was thought to be similar to prior results and no further workup was planned. An echocardiogram was obtained secondary to poor R-wave progression on her EKG and this showed a normal EF of 55-60% with Grade 2 DD and no wall motion abnormalities.   In talking with the patient today, she is initially very frustrated as she arrived for her appointment at 0800 but her name did not show up in the system and she was not called back. She approached the front desk at 0900 and then was promptly arrived in the system and brought back to an exam room. She was also here for an appointment this past Monday but it had been cancelled due to a provider's schedule change and she reports never being notified of the appointment being cancelled. (This was brought to the front office manager's attention and I apologized to the patient).   She reports her palpitations have significantly improved since Atenolol dosing was increased at her last office visit. She does report episodes of dizziness occurring sporadically. BP has been stable when checked at home. She has also experienced episodes of pain  along her right neck and questions whether she might have a blockage in that area.  She denies any recent chest pain or dyspnea with exertion. Able to perform her daily activities without difficulty. Is interested in possibly having a gastric sleeve in the future secondary to recent weight gain. Says her anti-depressant medication was recently changed and is interested in trying a different option as her weight gain has been worse since this recent adjustment.    Past Medical History:  Diagnosis Date  . Abnormal stress test    a. 09/2016: NST showed a small defect of mild severity present in the basal inferoseptal and mid inferoseptal location, consistent with ischemia. --> medically managed  . ALLERGIC RHINITIS   . Chronic kidney disease   . DDD (degenerative disc disease), lumbar    ESI with Ramos (spring 2016)  . Depression   . Diabetes mellitus without complication ([place] . Dyslipidemia   . External hemorrhoids   . GERD (gastroesophageal reflux disease)   . Hypertension   . Obesity   . Osteoarthritis   . Palpitations    a. prior event monitor showing sinus tachycardia, no PAF.   . [place] Kitchen[place] attacks    Hx of depression  . Sleep apnea    CPAP hs    Past Surgical History:  Procedure Laterality Date  . ABDOMINAL HYSTERECTOMY    . MASS EXCISION Left 12/30/2015   Procedure: EXCISION LEFT LEG MASS;  Surgeon: MatDonnie MesaD;  Location: [facility];  Service: General;  Laterality: Left;  . REPLACEMENT TOTAL KNEE Right   . RIGHT OOPHORECTOMY     No cancer    Current Medications: Outpatient Medications Prior to Visit  Medication Sig Dispense Refill  . ALPRAZolam (XANAX) 0.5 MG tablet Take 1 tablet (0.5 mg total) by mouth 2 (two) times daily as needed for anxiety. 60 tablet 1  . blood glucose meter kit and supplies KIT Dispense based on patient and insurance preference. Use up to four times daily as directed. (FOR ICD-9 250.00, 250.01). 1 each 0  . fluticasone  (FLONASE) 50 MCG/ACT nasal spray New Sig: Spray once into each nostril up to twice a day as needed for . 16 g 11  . furosemide (LASIX) 40 MG tablet Take 1 tablet (40 mg total) by mouth 2 (two) times daily. 180 tablet 3  . hydrocortisone (ANUSOL-HC) 2.5 % rectal cream Place rectally as needed. 30 g 3  . linaclotide (LINZESS) 145 MCG CAPS capsule Take 1 capsule (145 mcg total) by mouth daily. 90 capsule 3  . Loratadine (CLARITIN PO) Take by mouth.    . meclizine (ANTIVERT) 25 MG tablet Take 1 tablet (25 mg total) by mouth 3 (three) times daily as needed for dizziness or nausea. 180 tablet 3  . metFORMIN (GLUCOPHAGE) 500 MG tablet Take 1 tablet (500 mg total) by mouth daily with breakfast. 90 tablet 1  . omeprazole (PRILOSEC) 20 MG capsule Take 1 capsule (20 mg total) by mouth daily. 90 capsule 3  . potassium chloride SA (K-DUR,KLOR-CON) 20 MEQ tablet Take 1 tablet (20 mEq total) by mouth 2 (two) times daily. 180 tablet 3  . simvastatin (ZOCOR) 40 MG tablet Take 1 tablet (40 mg total) by mouth daily at 6 PM. 90 tablet 3  . SUMAtriptan-naproxen (TREXIMET) 85-500 MG tablet Take 1 tablet by mouth every 2 (two) hours as needed for migraine. 10 tablet 8  . tizanidine (ZANAFLEX) 2 MG capsule Take 1 capsule (2 mg total) by mouth 3 (three) times daily as needed for muscle spasms. 270 capsule 1  . traMADol (ULTRAM) 50 MG tablet Take 1 tablet (50 mg total) by mouth every 8 (eight) hours as needed. 40 tablet 0  . venlafaxine XR (EFFEXOR-XR) 150 MG 24 hr capsule Take 175 mg by mouth daily with breakfast.     . atenolol (TENORMIN) 50 MG tablet Take 1 tablet (50 mg total) by mouth daily. 90 tablet 3   No facility-administered medications prior to visit.      Allergies:   Codeine; Guaifenesin-codeine; and Wellbutrin [bupropion]   Social History   Social History  . Marital status: Married    Spouse name: N/A  . Number of children: 3  . Years of education: N/A   Occupational History  . Disabled    Social  History Main Topics  . Smoking status: Never Smoker  . Smokeless tobacco: Never Used  . Alcohol use 0.6 oz/week    1 Glasses of wine per week     Comment: one drink a month  . Drug use: No  . Sexual activity: Not Currently   Other Topics Concern  . None   Social History Narrative   Married with children. Pt is on disablity. Previous worked in the school system     Family History:  The patient's family history includes Allergies in her sister; Asthma in her other and son; Breast cancer in her maternal aunt; Colon cancer in her cousin; Dementia in  her father; Diabetes in her mother and son; Heart disease in her mother and son; Kidney failure in her father; Prostate cancer in her father and maternal uncle; Thyroid disease in her mother.   Review of Systems:   Please see the history of present illness.     General:  No chills, fever, night sweats or weight changes. Positive for right neck pain.  Cardiovascular:  No chest pain, dyspnea on exertion, edema, orthopnea, paroxysmal nocturnal dyspnea. Positive for palpitations.  Dermatological: No rash, lesions/masses Respiratory: No cough, dyspnea Urologic: No hematuria, dysuria Abdominal:   No nausea, vomiting, diarrhea, bright red blood per rectum, melena, or hematemesis Neurologic:  No visual changes, wkns, changes in mental status. Positive for dizziness.  All other systems reviewed and are otherwise negative except as noted above.   Physical Exam:    VS:  BP 119/81   Pulse (!) 59   Ht _0  (1.676 m)   Wt 250 lb (113.4 kg)   BMI 40.35 kg/m    General: Well developed, obese African American,female appearing in no acute distress. Head: Normocephalic, atraumatic, sclera non-icteric, no xanthomas, nares are without discharge.  Neck: No carotid bruits. JVD not elevated.  Lungs: Respirations regular and unlabored, without wheezes or rales.  Heart: Regular rate and rhythm. No S3 or S4.  No murmur, no rubs, or gallops  appreciated. Abdomen: Soft, non-tender, non-distended with normoactive bowel sounds. No hepatomegaly. No rebound/guarding. No obvious abdominal masses. Msk:  Strength and tone appear normal for age. No joint deformities or effusions. Extremities: No clubbing or cyanosis. No edema.  Distal pedal pulses are 2+ bilaterally. Neuro: Alert and oriented X 3. Moves all extremities spontaneously. No focal deficits noted. Psych:  Responds to questions appropriately with a normal affect. Skin: No rashes or lesions noted  Wt Readings from Last 3 Encounters:  11/11/16 250 lb (113.4 kg)  10/05/16 254 lb (115.2 kg)  09/27/16 250 lb (113.4 kg)     Studies/Labs Reviewed:   EKG:  EKG is not ordered today.   Recent Labs: 05/07/2016: Hemoglobin 12.4; Platelets 249.0 09/27/2016: ALT 8; BUN 16; Creatinine, Ser 0.66; Potassium 3.9; Sodium 142; TSH 2.39   Lipid Panel    Component Value Date/Time   CHOL 184 09/27/2016 1608   TRIG 195.0 (H) 09/27/2016 1608   TRIG 51 09/20/2006 0915   HDL 44.20 09/27/2016 1608   CHOLHDL 4 09/27/2016 1608   VLDL 39.0 09/27/2016 1608   LDLCALC 100 (H) 09/27/2016 1608    Additional studies/ records that were reviewed today include:   Echocardiogram: 10/21/2016 Study Conclusions  - Left ventricle: The cavity size was normal. Wall thickness was   normal. Systolic function was normal. The estimated ejection   fraction was in the range of 55% to 60%. Wall motion was normal;   there were no regional wall motion abnormalities. Features are   consistent with a pseudonormal left ventricular filling pattern,   with concomitant abnormal relaxation and increased filling   pressure (grade 2 diastolic dysfunction).   NST: 09/23/2016  Nuclear stress EF: 53%.  There was no ST segment deviation noted during stress.  Defect 1: There is a small defect of mild severity present in the basal inferoseptal and mid inferoseptal location. consistent with ischemia  Findings  consistent with ischemia.  This is a low to intermediate risk study.   Assessment:    1. Palpitations   2. Dizziness   3. Abnormal stress test   4. Essential hypertension, benign   5.  Other hyperlipidemia      Plan:   In order of problems listed above:  1. Palpitations - prior event monitor showing sinus tachycardia with no episodes of PAF. TSH WNL in 09/2016. Her palpitations have significantly improved with recent Atenolol dose adjustment from 6m BID to 278mAM/509m.  - current episodes of palpitations last for a few seconds to minutes at times. Denies any associated chest discomfort, dyspnea, or presyncope. - Continue Atenolol at current dosing as blood pressure and HR remain stable. Would not further titrate her dosing with HR of 59 on examination.   2. Dizziness/ Right Neck Pain - reports sporadic episodes of dizziness, occurring with activity or sitting down resting. No documented hypotension. Has also experienced episodes of pain along her right neck. - no bruit appreciated on exam.  Will obtain Carotid Dopplers in the setting of new-onset symptoms.   3. Abnormal Stress Test - NST in 09/2016 showed a small defect of mild severity present in the basal inferoseptal and mid inferoseptal location, consistent with ischemia. Has denied any chest pain and medical management recommended as this is possibly attenuation and similar to her prior stress test in 2013. Echo performed earlier this month showed a normal EF of 55-60% with Grade 2 DD and no wall motion abnormalities.  - she denies any recent chest discomfort or dyspnea with exertion. Would not pursue further ischemic evaluation at this time.   4. HTN - BP well-controlled at 119/81 today.  - continue Atenolol (dosing as above) and Lasix 41m69mD.   5. HLD - Lipid Panel in 09/2016 showed total cholesterol 184, triglycerides 195, HDL 44, and LDL 100. - continue Simvastatin 41mg36mly.     Medication Adjustments/Labs  and Tests Ordered: Current medicines are reviewed at length with the patient today.  Concerns regarding medicines are outlined above.  Medication changes, Labs and Tests ordered today are listed in the Patient Instructions below. Patient Instructions  Medication Instructions:  Your physician recommends that you continue on your current medications as directed. Please refer to the Current Medication list given to you today.  A new precription has been sent to your pharmacy for: Atenolol 25mg 44mets  Labwork: NONE  Testing/Procedures: Your physician has requested that you have a carotid duplex. This test is an ultrasound of the carotid arteries in your neck. It looks at blood flow through these arteries that supply the brain with blood. Allow one hour for this exam. There are no restrictions or special instructions.   Follow-Up: Your physician wants you to follow-up in: 6 months with Dr. HochrePercival Spanishwill receive a reminder letter in the mail two months in advance. If you don't receive a letter, please call our office to schedule the follow-up appointment.   Any Other Special Instructions Will Be Listed Below (If Applicable).  If you need a refill on your cardiac medications before your next appointment, please call your pharmacy.    SignedArna Medici2/10/2016 10:09 AM    [facility] [address] [id number]: [phone] [address] [id number]: [phone]

## 2016-11-11 ENCOUNTER — Encounter: Payer: Self-pay | Admitting: Student

## 2016-11-11 ENCOUNTER — Ambulatory Visit (INDEPENDENT_AMBULATORY_CARE_PROVIDER_SITE_OTHER): Payer: Medicare Other | Admitting: Student

## 2016-11-11 VITALS — BP 119/81 | HR 59 | Ht 66.0 in | Wt 250.0 lb

## 2016-11-11 DIAGNOSIS — R002 Palpitations: Secondary | ICD-10-CM | POA: Diagnosis not present

## 2016-11-11 DIAGNOSIS — R42 Dizziness and giddiness: Secondary | ICD-10-CM

## 2016-11-11 DIAGNOSIS — E784 Other hyperlipidemia: Secondary | ICD-10-CM | POA: Diagnosis not present

## 2016-11-11 DIAGNOSIS — I10 Essential (primary) hypertension: Secondary | ICD-10-CM | POA: Diagnosis not present

## 2016-11-11 DIAGNOSIS — E7849 Other hyperlipidemia: Secondary | ICD-10-CM

## 2016-11-11 DIAGNOSIS — R9439 Abnormal result of other cardiovascular function study: Secondary | ICD-10-CM

## 2016-11-11 MED ORDER — ATENOLOL 25 MG PO TABS: ORAL_TABLET | ORAL | 3 refills | Status: DC

## 2016-11-11 NOTE — Patient Instructions (Signed)
Medication Instructions:  Your physician recommends that you continue on your current medications as directed. Please refer to the Current Medication list given to you today.  A new precription has been sent to your pharmacy for: Atenolol 25mg  tablets  Labwork: NONE  Testing/Procedures: Your physician has requested that you have a carotid duplex. This test is an ultrasound of the carotid arteries in your neck. It looks at blood flow through these arteries that supply the brain with blood. Allow one hour for this exam. There are no restrictions or special instructions.   Follow-Up: Your physician wants you to follow-up in: 6 months with Dr. Percival Spanish. You will receive a reminder letter in the mail two months in advance. If you don't receive a letter, please call our office to schedule the follow-up appointment.   Any Other Special Instructions Will Be Listed Below (If Applicable).     If you need a refill on your cardiac medications before your next appointment, please call your pharmacy.

## 2016-11-15 DIAGNOSIS — H40013 Open angle with borderline findings, low risk, bilateral: Secondary | ICD-10-CM | POA: Diagnosis not present

## 2016-11-15 DIAGNOSIS — E119 Type 2 diabetes mellitus without complications: Secondary | ICD-10-CM | POA: Diagnosis not present

## 2016-11-30 ENCOUNTER — Ambulatory Visit (HOSPITAL_COMMUNITY)
Admission: RE | Admit: 2016-11-30 | Discharge: 2016-11-30 | Disposition: A | Discharge status: Home / Self Care | Payer: Medicare Other | Source: Ambulatory Visit | Attending: Cardiology | Admitting: Cardiology

## 2016-11-30 DIAGNOSIS — I6523 Occlusion and stenosis of bilateral carotid arteries: Secondary | ICD-10-CM | POA: Diagnosis not present

## 2016-11-30 DIAGNOSIS — E785 Hyperlipidemia, unspecified: Secondary | ICD-10-CM | POA: Diagnosis not present

## 2016-11-30 DIAGNOSIS — I10 Essential (primary) hypertension: Secondary | ICD-10-CM | POA: Diagnosis not present

## 2016-11-30 DIAGNOSIS — R42 Dizziness and giddiness: Secondary | ICD-10-CM | POA: Diagnosis not present

## 2016-11-30 DIAGNOSIS — E119 Type 2 diabetes mellitus without complications: Secondary | ICD-10-CM | POA: Diagnosis not present

## 2016-12-02 ENCOUNTER — Ambulatory Visit (INDEPENDENT_AMBULATORY_CARE_PROVIDER_SITE_OTHER): Payer: Medicare Other | Admitting: Family Medicine

## 2016-12-02 ENCOUNTER — Encounter: Payer: Self-pay | Admitting: Family Medicine

## 2016-12-02 VITALS — BP 130/78 | HR 98 | Temp 99.2°F | Resp 12 | Ht 66.0 in | Wt 247.2 lb

## 2016-12-02 DIAGNOSIS — R110 Nausea: Secondary | ICD-10-CM

## 2016-12-02 DIAGNOSIS — E119 Type 2 diabetes mellitus without complications: Secondary | ICD-10-CM

## 2016-12-02 DIAGNOSIS — R509 Fever, unspecified: Secondary | ICD-10-CM | POA: Diagnosis not present

## 2016-12-02 DIAGNOSIS — J069 Acute upper respiratory infection, unspecified: Secondary | ICD-10-CM

## 2016-12-02 LAB — POCT RAPID STREP A (OFFICE): Rapid Strep A Screen: NEGATIVE

## 2016-12-02 LAB — POC INFLUENZA A&B (BINAX/QUICKVUE)
Influenza A, POC: NEGATIVE
Influenza B, POC: NEGATIVE

## 2016-12-02 MED ORDER — KETOROLAC TROMETHAMINE 60 MG/2ML IM SOLN
30.0000 mg | Freq: Once | INTRAMUSCULAR | Status: AC
  Administered 2016-12-02: 30 mg via INTRAMUSCULAR

## 2016-12-02 MED ORDER — PROMETHAZINE HCL 25 MG PO TABS: 25.0000 mg | ORAL_TABLET | Freq: Three times a day (TID) | ORAL | 0 refills | Status: DC | PRN

## 2016-12-02 MED ORDER — BENZONATATE 100 MG PO CAPS: 200.0000 mg | ORAL_CAPSULE | Freq: Two times a day (BID) | ORAL | 0 refills | Status: AC | PRN

## 2016-12-02 MED ORDER — LIDOCAINE VISCOUS 2 % MT SOLN: 5.0000 mL | Freq: Three times a day (TID) | OROMUCOSAL | 0 refills | Status: AC | PRN

## 2016-12-02 MED ORDER — ONDANSETRON HCL 4 MG/2ML IJ SOLN
4.0000 mg | Freq: Once | INTRAMUSCULAR | Status: AC
  Administered 2016-12-02: 4 mg via INTRAMUSCULAR

## 2016-12-02 NOTE — Patient Instructions (Signed)
  [name] I have seen you today for an acute visit.  A few things to remember from today's visit:   Fever, unspecified fever cause - Plan: POC Influenza A&B (Binax test), POC Rapid Strep A, ketorolac (TORADOL) injection 30 mg  Nausea without vomiting - Plan: ondansetron (ZOFRAN) injection 4 mg, promethazine (PHENERGAN) 25 MG tablet  URI, acute - Plan: benzonatate (TESSALON) 100 MG capsule, lidocaine (XYLOCAINE) 2 % solution  Type 2 diabetes mellitus without complication, without long-term current use of insulin (HCC)   viral infections are self-limited and we treat each symptom depending of severity.  Over the counter medications as decongestants and cold medications usually help, they need to be taken with caution if there is a history of high blood pressure or palpitations. Tylenol and/or Ibuprofen also helps with most symptoms (headache, muscle aching, fever,etc) Plenty of fluids. Honey helps with cough. Steam inhalations helps with runny nose, nasal congestion, and may prevent sinus infections. Cough and nasal congestion could last a few days and sometimes weeks. Please follow in not any better in 1-2 weeks or if symptoms get worse.    Symptomatic treatment: Over the counter Acetaminophen 500 mg and/or Ibuprofen (400-600 mg) if there is not contraindications; you can alternate in between both every 4-6 hours. Gargles with saline water and throat lozenges might also help. Cold fluids.    Seek prompt medical evaluation if you are having difficulty breathing, mouth swelling, throat closing up, not able to swallow liquids (drooling), skin rash/bruising, or worsening symptoms.  Please follow up in 2 weeks if not any better.         Medications prescribed today are intended for short period of time and will not be refill upon request, a follow up appointment might be necessary to discuss continuation of of treatment if appropriate.     In general please  monitor for signs of worsening symptoms and seek immediate medical attention if any concerning.  If symptoms are not resolved in a few days/weeks you should schedule a follow up appointment with your doctor, before if needed.  Please be sure you have an appointment already scheduled with your PCP before you leave today.

## 2016-12-02 NOTE — Progress Notes (Signed)
HPI:  ACUTE VISIT  Chief Complaint  Patient presents with  . Headache  . Sinus Problem    Erica [name] is a 61 y.o.female here today with her daughter complaining of 3-4 days of respiratory symptoms. Initially she noted body aches,sore throat, and chills. Yesterday she started with fever,nausea, and diarrhea, she has had 3 loose stools so far. She has not noted abdominal pain and denies vomiting.   Productive cough with yellowish sputum, worse at night. + Nasal congestion, rhinorrhea,and post nasal drainage. Frontal headache, pressure, better today. She has Hx of migraine headaches and cephalgia.  She has not noted chest pain, dyspnea, but some night wheezing.  No Hx of recent travel. Sick contact: Granddaughter had flu symptoms a few weeks ago. No known insect bite.  Hx of allergies: Yes. Hx of DM II, she has not checked BS's.  OTC medications for this problem: "Headache pills" and "sinus pills", took Tylenol last night.  Symptoms otherwise stable.   Review of Systems  Constitutional: Positive for appetite change, fatigue and fever. Negative for chills.  HENT: Positive for congestion, postnasal drip, rhinorrhea, sinus pressure and sore throat. Negative for ear pain, mouth sores, sneezing, trouble swallowing and voice change.   Eyes: Negative for photophobia, discharge, redness and visual disturbance.  Respiratory: Positive for cough and wheezing. Negative for shortness of breath.   Cardiovascular: Negative for chest pain.  Gastrointestinal: Positive for diarrhea and nausea. Negative for abdominal pain, blood in stool and vomiting.  Endocrine: Negative for polydipsia, polyphagia and polyuria.  Genitourinary: Negative for decreased urine volume, dysuria and hematuria.  Musculoskeletal: Positive for myalgias. Negative for gait problem and neck pain.  Skin: Negative for rash.  Allergic/Immunologic: Positive for environmental allergies.  Neurological:  Positive for headaches. Negative for syncope, facial asymmetry, speech difficulty and weakness.  Hematological: Negative for adenopathy. Does not bruise/bleed easily.  Psychiatric/Behavioral: Negative for confusion. The patient is nervous/anxious.       Current Outpatient Prescriptions on File Prior to Visit  Medication Sig Dispense Refill  . ALPRAZolam (XANAX) 0.5 MG tablet Take 1 tablet (0.5 mg total) by mouth 2 (two) times daily as needed for anxiety. 60 tablet 1  . atenolol (TENORMIN) 25 MG tablet Take 56m (1 tablet) in the AM and 547m(2 tablets) in the PM 180 tablet 3  . blood glucose meter kit and supplies KIT Dispense based on patient and insurance preference. Use up to four times daily as directed. (FOR ICD-9 250.00, 250.01). 1 each 0  . fluticasone (FLONASE) 50 MCG/ACT nasal spray New Sig: Spray once into each nostril up to twice a day as needed for . 16 g 11  . furosemide (LASIX) 40 MG tablet Take 1 tablet (40 mg total) by mouth 2 (two) times daily. 180 tablet 3  . hydrocortisone (ANUSOL-HC) 2.5 % rectal cream Place rectally as needed. 30 g 3  . linaclotide (LINZESS) 145 MCG CAPS capsule Take 1 capsule (145 mcg total) by mouth daily. 90 capsule 3  . Loratadine (CLARITIN PO) Take by mouth.    . meclizine (ANTIVERT) 25 MG tablet Take 1 tablet (25 mg total) by mouth 3 (three) times daily as needed for dizziness or nausea. 180 tablet 3  . metFORMIN (GLUCOPHAGE) 500 MG tablet Take 1 tablet (500 mg total) by mouth daily with breakfast. 90 tablet 1  . omeprazole (PRILOSEC) 20 MG capsule Take 1 capsule (20 mg total) by mouth daily. 90 capsule 3  . potassium chloride SA (  K-DUR,KLOR-CON) 20 MEQ tablet Take 1 tablet (20 mEq total) by mouth 2 (two) times daily. 180 tablet 3  . simvastatin (ZOCOR) 40 MG tablet Take 1 tablet (40 mg total) by mouth daily at 6 PM. 90 tablet 3  . SUMAtriptan-naproxen (TREXIMET) 85-500 MG tablet Take 1 tablet by mouth every 2 (two) hours as needed for migraine. 10  tablet 8  . tizanidine (ZANAFLEX) 2 MG capsule Take 1 capsule (2 mg total) by mouth 3 (three) times daily as needed for muscle spasms. 270 capsule 1  . traMADol (ULTRAM) 50 MG tablet Take 1 tablet (50 mg total) by mouth every 8 (eight) hours as needed. 40 tablet 0  . venlafaxine XR (EFFEXOR-XR) 150 MG 24 hr capsule Take 175 mg by mouth daily with breakfast.     . [DISCONTINUED] oxycodone (OXY-IR) 5 MG capsule Take 5 mg by mouth every 4 (four) hours as needed. For pain.     No current facility-administered medications on file prior to visit.      Past Medical History:  Diagnosis Date  . Abnormal stress test    a. 09/2016: NST showed a small defect of mild severity present in the basal inferoseptal and mid inferoseptal location, consistent with ischemia. --> medically managed  . ALLERGIC RHINITIS   . Chronic kidney disease   . DDD (degenerative disc disease), lumbar    ESI with Ramos (spring 2016)  . Depression   . Diabetes mellitus without complication ([place])   . Dyslipidemia   . External hemorrhoids   . GERD (gastroesophageal reflux disease)   . Hypertension   . Obesity   . Osteoarthritis   . Palpitations    a. prior event monitor showing sinus tachycardia, no PAF.   Marland Kitchen Panic attacks    Hx of depression  . Sleep apnea    CPAP hs   Allergies  Allergen Reactions  . Codeine Nausea And Vomiting  . Guaifenesin-Codeine Other (See Comments)    REACTION: feels spacey  . Wellbutrin [Bupropion] Palpitations    hallucinations    Social History   Social History  . Marital status: Married    Spouse name: N/A  . Number of children: 3  . Years of education: N/A   Occupational History  . Disabled    Social History Main Topics  . Smoking status: Never Smoker  . Smokeless tobacco: Never Used  . Alcohol use 0.6 oz/week    1 Glasses of wine per week     Comment: one drink a month  . Drug use: No  . Sexual activity: Not Currently   Other Topics Concern  . None   Social  History Narrative   Married with children. Pt is on disablity. Previous worked in the school system    Vitals:   12/02/16 1055  BP: 130/78  Pulse: 98  Resp: 12  Temp: 99.2 F (37.3 C)  O2 sat at RA 95%. Body mass index is 39.91 kg/m.  Physical Exam  Nursing note and vitals reviewed. Constitutional: She is oriented to person, place, and time. She appears well-developed. She does not appear ill. She appears distressed (Mild and due to body aches.).  HENT:  Head: Atraumatic.  Right Ear: Tympanic membrane, external ear and ear canal normal.  Left Ear: Tympanic membrane, external ear and ear canal normal.  Nose: Rhinorrhea present. Right sinus exhibits no maxillary sinus tenderness and no frontal sinus tenderness. Left sinus exhibits no maxillary sinus tenderness and no frontal sinus tenderness.  Mouth/Throat: Uvula is  midline and mucous membranes are normal. Posterior oropharyngeal erythema present. No oropharyngeal exudate or posterior oropharyngeal edema.  Hypertrophic turbinates  Eyes: Conjunctivae and EOM are normal.  Neck: Neck supple. No muscular tenderness present.  Cardiovascular: Normal rate and regular rhythm.   No murmur heard. Respiratory: Effort normal and breath sounds normal. No stridor. No respiratory distress.  GI: Soft. She exhibits no mass. There is no hepatomegaly. There is no tenderness.  Lymphadenopathy:       Head (right side): No submandibular adenopathy present.       Head (left side): No submandibular adenopathy present.    She has no cervical adenopathy.  Neurological: She is alert and oriented to person, place, and time. She has normal strength. No cranial nerve deficit. Coordination and gait normal.  Skin: Skin is warm. No rash noted. No erythema.  Psychiatric: Her speech is normal. Her mood appears anxious.  Well groomed, good eye contact.      ASSESSMENT AND PLAN:   Erica [name] was seen today for headache and sinus problem.  Diagnoses and all  orders for this visit:  Fever, unspecified fever cause  Rapid flu and strep negative. OTC Tylenol 500 mg 3 times a day, rest, and oral hydration. She was instructed to let me know if she still has fever in about 3 days.  -     POC Influenza A&B (Binax test) -     POC Rapid Strep A -     ketorolac (TORADOL) injection 30 mg; Inject 1 mL (30 mg total) into the muscle once.  Nausea without vomiting  Most likely part of viral illness. She was instructed to drink small and frequent sips of clear fluids. Phenergan side effects discussed. Here in the office and after verbal consent Zofran 4 mg given.  -     ondansetron (ZOFRAN) injection 4 mg; Inject 2 mLs (4 mg total) into the muscle once. -     promethazine (PHENERGAN) 25 MG tablet; Take 1 tablet (25 mg total) by mouth every 8 (eight) hours as needed for nausea or vomiting.  URI, acute  Symptoms suggests a viral etiology, I explained patient that symptomatic treatment is usually recommended in this case, so I really do not think abx is needed at this time and may even aggravate GI symptoms.Sh agrees on waiting on abx treatment. Offered and agreeable to receive Toradol 30 mg IM to help with myalgias. Instructed to monitor for signs of complications,clearly instructed about warning signs. I also explained that cough and nasal congestion can last a few days and sometimes weeks. F/U as needed.  -     benzonatate (TESSALON) 100 MG capsule; Take 2 capsules (200 mg total) by mouth 2 (two) times daily as needed. -     lidocaine (XYLOCAINE) 2 % solution; Use as directed 5 mLs in the mouth or throat every 8 (eight) hours as needed for mouth pain.  Type 2 diabetes mellitus without complication, without long-term current use of insulin ([place])  For now no changes in current management. Monitor BS at home and caution with hypoglycemia.    -[name] was advised to return or notify a doctor immediately if symptoms worsen or persist or  new concerns arise.       Anjel Perfetti G. [place], MD  [facility]. [place] office.

## 2016-12-02 NOTE — Progress Notes (Signed)
Pre visit review using our clinic review tool, if applicable. No additional management support is needed unless otherwise documented below in the visit note. 

## 2016-12-08 ENCOUNTER — Telehealth: Payer: Self-pay | Admitting: Internal Medicine

## 2016-12-08 ENCOUNTER — Emergency Department (HOSPITAL_COMMUNITY): Payer: Medicare Other

## 2016-12-08 ENCOUNTER — Telehealth: Payer: Self-pay | Admitting: Student

## 2016-12-08 ENCOUNTER — Encounter (HOSPITAL_COMMUNITY): Payer: Self-pay

## 2016-12-08 ENCOUNTER — Emergency Department (HOSPITAL_COMMUNITY)
Admission: EM | Admit: 2016-12-08 | Discharge: 2016-12-08 | Disposition: A | Discharge status: Home / Self Care | Payer: Medicare Other | Attending: Emergency Medicine | Admitting: Emergency Medicine

## 2016-12-08 DIAGNOSIS — R05 Cough: Secondary | ICD-10-CM

## 2016-12-08 DIAGNOSIS — Z96651 Presence of right artificial knee joint: Secondary | ICD-10-CM | POA: Diagnosis not present

## 2016-12-08 DIAGNOSIS — R0982 Postnasal drip: Secondary | ICD-10-CM | POA: Insufficient documentation

## 2016-12-08 DIAGNOSIS — I10 Essential (primary) hypertension: Secondary | ICD-10-CM | POA: Insufficient documentation

## 2016-12-08 DIAGNOSIS — Z7984 Long term (current) use of oral hypoglycemic drugs: Secondary | ICD-10-CM | POA: Diagnosis not present

## 2016-12-08 DIAGNOSIS — J069 Acute upper respiratory infection, unspecified: Secondary | ICD-10-CM | POA: Diagnosis not present

## 2016-12-08 DIAGNOSIS — Z79899 Other long term (current) drug therapy: Secondary | ICD-10-CM | POA: Diagnosis not present

## 2016-12-08 DIAGNOSIS — R059 Cough, unspecified: Secondary | ICD-10-CM

## 2016-12-08 HISTORY — DX: Disorder of kidney and ureter, unspecified: N28.9

## 2016-12-08 MED ORDER — BUDESONIDE 32 MCG/ACT NA SUSP: 2.0000 | Freq: Every day | NASAL | 1 refills | Status: DC

## 2016-12-08 MED ORDER — AEROCHAMBER PLUS FLO-VU MEDIUM MISC
1.0000 | Freq: Once | Status: AC
  Administered 2016-12-08: 1
  Filled 2016-12-08: qty 1

## 2016-12-08 MED ORDER — IPRATROPIUM-ALBUTEROL 0.5-2.5 (3) MG/3ML IN SOLN
3.0000 mL | Freq: Once | RESPIRATORY_TRACT | Status: AC
  Administered 2016-12-08: 3 mL via RESPIRATORY_TRACT
  Filled 2016-12-08: qty 3

## 2016-12-08 MED ORDER — ALBUTEROL SULFATE HFA 108 (90 BASE) MCG/ACT IN AERS
2.0000 | INHALATION_SPRAY | Freq: Once | RESPIRATORY_TRACT | Status: AC
  Administered 2016-12-08: 2 via RESPIRATORY_TRACT
  Filled 2016-12-08: qty 6.7

## 2016-12-08 MED ORDER — CETIRIZINE HCL 10 MG PO TABS: 10.0000 mg | ORAL_TABLET | Freq: Every day | ORAL | 1 refills | Status: DC

## 2016-12-08 NOTE — Telephone Encounter (Signed)
New message ° ° ° °Pt is returning Jennifer's call.  °

## 2016-12-08 NOTE — Telephone Encounter (Signed)
[facility] - [place] Patient Name: Erica [name] DOB: 11-02-1955 Initial Comment Caller states that she was seen last Thursday and she was diagnosed with the flu and the doctor gave her medications for the coughing but it got worse and her heart rate raising . Nurse Assessment Nurse: Zorita Pang, RN, Deborah Date/Time (Eastern Time): 12/08/2016 12:05:08 PM Confirm and document reason for call. If symptomatic, describe symptoms. ---The caller states she saw the MD last Thursday and he gave her medications for sore throat (Lidocaine) and cough syrup and promethazine (nausea) but she is SOB and heart is racing. Breathing is very heavy. No fever now Does the patient have any new or worsening symptoms? ---Yes Will a triage be completed? ---Yes Related visit to physician within the last 2 weeks? ---Yes Does the PT have any chronic conditions? (i.e. diabetes, asthma, etc.) ---Yes List chronic conditions. ---anxiety, diabetes, hypertension (atenolol and Lasix) Is this a behavioral health or substance abuse call? ---No Guidelines Guideline Title Affirmed Question Affirmed Notes Cough - Acute Productive [1] Continuous (nonstop) coughing interferes with work or school AND [2] no improvement using cough treatment per Care Advice Final Disposition User See Physician within 24 Hours [place], RN, Neoma Laming Comments The caller states that she gets very SOB when she walks and when she has the coughing spells. Due to the patient have a new onset of dyspnea on exertion, she was instructed to go to the ER to be evaluated and the patient is willing to comply with the ER visit and verbalizes understanding. Referrals [facility] - ED Disagree/Comply: Comply

## 2016-12-08 NOTE — ED Triage Notes (Signed)
Patient states she was treated for the flu last week. And today she is still having a productive cough with yellow /green sputum. Patient is also having expiratory wheezing.

## 2016-12-08 NOTE — ED Provider Notes (Signed)
[facility] DEPT Provider Note   CSN: [id number] Arrival date & time: 12/08/16  1332     History   Chief Complaint Chief Complaint  Patient presents with  . flu symptoms  . Wheezing  . Cough    HPI Erica [name] is a 61 y.o. female.  Erica [name] is a 61 y.o. Female who presents to the emergency department complaining of ongoing cough, sneezing, nasal congestion and postnasal drip for the past week. Patient reports last week she was diagnosed with influenza and started on Tessalon Perles and promethazine. She reports her fevers and body aches have resolved, but her cough and nasal congestion has persisted. She reports ear pressure, nasal congestion, postnasal drip, productive cough, chest tightness and some wheezing. She reports fevers have resolved. No antipyretics prior to arrival today. She did not take Tamiflu. She did not receive her flu shot this year. She denies fevers, chest pain, abdominal pain, nausea, vomiting, trouble swallowing, sore throat, ear discharge, lightheadedness, syncope or rashes.   The history is provided by the patient and medical records. No language interpreter was used.  Wheezing   Associated symptoms include ear pain, rhinorrhea and cough. Pertinent negatives include no chest pain, no fever, no abdominal pain, no vomiting, no diarrhea, no dysuria, no headaches, no sore throat, no neck pain and no rash.  Cough  Associated symptoms include ear pain, rhinorrhea and wheezing. Pertinent negatives include no chest pain, no chills, no headaches, no sore throat and no shortness of breath.    Past Medical History:  Diagnosis Date  . Abnormal stress test    a. 09/2016: NST showed a small defect of mild severity present in the basal inferoseptal and mid inferoseptal location, consistent with ischemia. --> medically managed  . ALLERGIC RHINITIS   . DDD (degenerative disc disease), lumbar    ESI with Ramos (spring 2016)  . Depression   . Diabetes  mellitus without complication ([place])   . Dyslipidemia   . External hemorrhoids   . GERD (gastroesophageal reflux disease)   . Hypertension   . Obesity   . Osteoarthritis   . Palpitations    a. prior event monitor showing sinus tachycardia, no PAF.   Marland Kitchen Panic attacks    Hx of depression  . Renal disorder   . Sleep apnea    CPAP hs    Patient Active Problem List   Diagnosis Date Noted  . Panic attacks 09/27/2016  . GERD (gastroesophageal reflux disease) 09/27/2016  . Chest pain 08/23/2016  . Grief reaction 08/23/2016  . Right ankle pain 08/19/2016  . RLQ abdominal pain 05/08/2016  . Diabetes ([place]) 02/15/2016  . Obesity 02/13/2016  . Left leg pain 11/28/2015  . Skin nodule 11/28/2015  . Migraine without aura and with status migrainosus, not intractable 10/03/2015  . Numbness of left hand 10/03/2015  . Cephalalgia 10/03/2015  . Abnormal ECG 09/11/2012  . Palpitations 09/08/2012  . Sciatica of left side   . Peripheral edema 05/09/2012  . FATIGUE 02/23/2010  . Obstructive sleep apnea 10/31/2009  . Morbid obesity ([place]) 10/17/2009  . HEMORRHOIDS, EXTERNAL 10/17/2009  . CONSTIPATION, CHRONIC 10/17/2009  . URTICARIA 03/11/2009  . Hyperlipidemia 01/12/2008  . Depression, major, recurrent ([place]) 01/12/2008  . Essential hypertension, benign 01/12/2008  . Allergic rhinitis 01/12/2008    Past Surgical History:  Procedure Laterality Date  . ABDOMINAL HYSTERECTOMY    . MASS EXCISION Left 12/30/2015   Procedure: EXCISION LEFT LEG MASS;  Surgeon: Donnie Mesa, MD;  Location:  [facility];  Service: General;  Laterality: Left;  . REPLACEMENT TOTAL KNEE Right   . RIGHT OOPHORECTOMY     No cancer    OB History    No data available       Home Medications    Prior to Admission medications   Medication Sig Start Date End Date Taking? Authorizing Provider  ALPRAZolam Duanne Moron) 0.5 MG tablet Take 1 tablet (0.5 mg total) by mouth 2 (two) times daily as needed for  anxiety. 08/17/16  Yes Biagio Borg, MD  atenolol (TENORMIN) 25 MG tablet Take 3m (1 tablet) in the AM and 553m(2 tablets) in the PM Patient taking differently: Take 25 mg by mouth 2 (two) times daily.  11/11/16  Yes BrErma HeritagePA  benzonatate (TESSALON) 100 MG capsule Take 2 capsules (200 mg total) by mouth 2 (two) times daily as needed. Patient taking differently: Take 200 mg by mouth 2 (two) times daily as needed for cough.  12/02/16 12/12/16 Yes Betty G JoMartiniqueMD  furosemide (LASIX) 40 MG tablet Take 1 tablet (40 mg total) by mouth 2 (two) times daily. 09/27/16  Yes StBinnie RailMD  lidocaine (XYLOCAINE) 2 % solution Use as directed 5 mLs in the mouth or throat every 8 (eight) hours as needed for mouth pain. 12/02/16 12/09/16 Yes Betty G JoMartiniqueMD  linaclotide ([facility]145 MCG CAPS capsule Take 1 capsule (145 mcg total) by mouth daily. 03/23/16  Yes StBinnie RailMD  meclizine (ANTIVERT) 25 MG tablet Take 1 tablet (25 mg total) by mouth 3 (three) times daily as needed for dizziness or nausea. 03/23/16  Yes StBinnie RailMD  metFORMIN (GLUCOPHAGE) 500 MG tablet Take 1 tablet (500 mg total) by mouth daily with breakfast. 09/27/16  Yes StBinnie RailMD  omeprazole (PRILOSEC) 20 MG capsule Take 1 capsule (20 mg total) by mouth daily. Patient taking differently: Take 20 mg by mouth at bedtime.  03/23/16  Yes StBinnie RailMD  potassium chloride SA (K-DUR,KLOR-CON) 20 MEQ tablet Take 1 tablet (20 mEq total) by mouth 2 (two) times daily. 03/23/16  Yes StBinnie RailMD  promethazine (PHENERGAN) 25 MG tablet Take 1 tablet (25 mg total) by mouth every 8 (eight) hours as needed for nausea or vomiting. 12/02/16 12/09/16 Yes Betty G JoMartiniqueMD  simvastatin (ZOCOR) 40 MG tablet Take 1 tablet (40 mg total) by mouth daily at 6 PM. Patient taking differently: Take 40 mg by mouth daily.  03/23/16  Yes StBinnie RailMD  venlafaxine XR (EFFEXOR-XR) 150 MG 24 hr capsule Take 150 mg by mouth daily with  breakfast.    Yes Historical Provider, MD  blood glucose meter kit and supplies KIT Dispense based on patient and insurance preference. Use up to four times daily as directed. (FOR ICD-9 250.00, 250.01). 03/23/16   StBinnie RailMD  budesonide (RHINOCORT AQUA) 32 MCG/ACT nasal spray Place 2 sprays into both nostrils daily. 12/08/16   WiWaynetta PeanPA-C  cetirizine (ZYRTEC ALLERGY) 10 MG tablet Take 1 tablet (10 mg total) by mouth daily. 12/08/16   WiWaynetta PeanPA-C  hydrocortisone (ANUSOL-HC) 2.5 % rectal cream Place rectally as needed. 03/23/16   StBinnie RailMD  Loratadine (CLARITIN PO) Take 10 tablets by mouth daily as needed (allergies).     Historical Provider, MD  SUMAtriptan-naproxen (TREXIMET) 85-500 MG tablet Take 1 tablet by mouth every 2 (two) hours as needed for migraine. 03/23/16  [name], MD  tizanidine (ZANAFLEX) 2 MG capsule Take 1 capsule (2 mg total) by mouth 3 (three) times daily as needed for muscle spasms. 03/12/15   Rowe Clack, MD  traMADol (ULTRAM) 50 MG tablet Take 1 tablet (50 mg total) by mouth every 8 (eight) hours as needed. 11/28/15   Biagio Borg, MD    Family History Family History  Problem Relation Age of Onset  . Heart disease Mother     Enlarged Heart, Pacemaker  . Diabetes Mother   . Thyroid disease Mother   . Dementia Father   . Kidney failure Father   . Prostate cancer Father   . Asthma Other   . Allergies Sister   . Asthma Son   . Breast cancer Maternal Aunt     cousin  . Colon cancer Cousin   . Heart disease Son     CHF, morbid obesity  . Prostate cancer Maternal Uncle   . Diabetes Son     Social History Social History  Substance Use Topics  . Smoking status: Never Smoker  . Smokeless tobacco: Never Used  . Alcohol use 0.6 oz/week    1 Glasses of wine per week     Comment: one drink a month     Allergies   Codeine; Guaifenesin-codeine; and Wellbutrin [bupropion]   Review of Systems Review of Systems    Constitutional: Negative for chills and fever.  HENT: Positive for congestion, ear pain, postnasal drip, rhinorrhea and sneezing. Negative for sore throat and trouble swallowing.   Eyes: Negative for visual disturbance.  Respiratory: Positive for cough, chest tightness and wheezing. Negative for shortness of breath.   Cardiovascular: Negative for chest pain and palpitations.  Gastrointestinal: Negative for abdominal pain, diarrhea, nausea and vomiting.  Genitourinary: Negative for dysuria.  Musculoskeletal: Negative for back pain and neck pain.  Skin: Negative for rash.  Neurological: Negative for syncope, light-headedness and headaches.     Physical Exam Updated Vital Signs BP 128/83 (BP Location: Right Arm)   Pulse 68   Temp 98.6 F (37 C) (Oral)   Resp 16   Ht _0  (1.676 m)   Wt 112 kg   SpO2 98%   BMI 39.87 kg/m   Physical Exam  Constitutional: She appears well-developed and well-nourished. No distress.  Nontoxic appearing.  HENT:  Head: Normocephalic and atraumatic.  Right Ear: External ear normal.  Left Ear: External ear normal.  Mouth/Throat: Oropharynx is clear and moist. No oropharyngeal exudate.  Mild clear middle ear effusion noted bilaterally. No TM erythema or loss of landmarks. Body nasal turbinates and rhinorrhea present bilaterally. Evidence of postnasal drip in her oropharynx. Uvula is midline without edema. No peritonsillar abscess. No tonsillar hypertrophy or exudates.  Eyes: Conjunctivae are normal. Pupils are equal, round, and reactive to light. Right eye exhibits no discharge. Left eye exhibits no discharge.  Neck: Neck supple. No JVD present.  Cardiovascular: Normal rate, regular rhythm, normal heart sounds and intact distal pulses.  Exam reveals no gallop and no friction rub.   No murmur heard. Pulmonary/Chest: Effort normal. No stridor. No respiratory distress. She has wheezes. She has no rales.  Slight scattered wheezes noted bilaterally. No  increased work of breathing. No rhonchi.  Abdominal: Soft. There is no tenderness. There is no guarding.  Musculoskeletal: She exhibits no edema or tenderness.  No lower extremity edema or tenderness.  Lymphadenopathy:    She has no cervical adenopathy.  Neurological: She is alert. Coordination normal.  Skin: Skin is warm and dry. Capillary refill takes less than 2 seconds. No rash noted. She is not diaphoretic. No erythema. No pallor.  Psychiatric: She has a normal mood and affect. Her behavior is normal.  Nursing note and vitals reviewed.    ED Treatments / Results  Labs (all labs ordered are listed, but only abnormal results are displayed) Labs Reviewed - No data to display  EKG  EKG Interpretation None       Radiology Dg Chest 2 View  Result Date: 12/08/2016 CLINICAL DATA:  Recent history of the flu, still has cough and sputum production with wheezing EXAM: CHEST  2 VIEW COMPARISON:  Chest x-ray of 05/26/2005 FINDINGS: No active infiltrate or effusion is seen. Mediastinal and hilar contours are unremarkable. The heart is within upper limits of normal in size. No bony abnormality is seen. IMPRESSION: No active cardiopulmonary disease. Electronically Signed   By: Ivar Drape M.D.   On: 12/08/2016 15:40    Procedures Procedures (including critical care time)  Medications Ordered in ED Medications  ipratropium-albuterol (DUONEB) 0.5-2.5 (3) MG/3ML nebulizer solution 3 mL (3 mLs Nebulization Given 12/08/16 1455)     Initial Impression / Assessment and Plan / ED Course  I have reviewed the triage vital signs and the nursing notes.  Pertinent labs & imaging results that were available during my care of the patient were reviewed by me and considered in my medical decision making (see chart for details).    This  is a 61 y.o. Female who presents to the emergency department complaining of ongoing cough, sneezing, nasal congestion and postnasal drip for the past week. Patient  reports last week she was diagnosed with influenza and started on Tessalon Perles and promethazine. She reports her fevers and body aches have resolved, but her cough and nasal congestion has persisted. She reports ear pressure, nasal congestion, postnasal drip, productive cough, chest tightness and some wheezing. She reports fevers have resolved. No antipyretics prior to arrival today. She did not take Tamiflu. She did not receive her flu shot this year. She denies fevers, or chest pain.  On exam patient is afebrile nontoxic appearing. She slight scattered wheezes noted bilaterally with no increased work of breathing. No rales or rhonchi. Rhinorrhea is present. Boggy nasal turbinates bilaterally. Mild middle ear effusion bilaterally. She has postnasal drip in her posterior oropharynx.  Chest x-ray is clear. She reports feeling somewhat better after breathing treatment. No further wheezing. We'll provide her with an albuterol inhaler at discharge. We'll start her on Rhinocort Aqua and Zyrtec for her postnasal drip. I encouraged her to push oral fluids. She can continue using Tessalon Perles for coughing as needed. I advised the patient to follow-up with their primary care provider this week. I advised the patient to return to the emergency department with new or worsening symptoms or new concerns. The patient verbalized understanding and agreement with plan.     Final Clinical Impressions(s) / ED Diagnoses   Final diagnoses:  Viral upper respiratory tract infection  Post-nasal drip  Cough    New Prescriptions New Prescriptions   BUDESONIDE (RHINOCORT AQUA) 32 MCG/ACT NASAL SPRAY    Place 2 sprays into both nostrils daily.   CETIRIZINE (ZYRTEC ALLERGY) 10 MG TABLET    Take 1 tablet (10 mg total) by mouth daily.     Waynetta Pean, PA-C 12/08/16 1608    Leo Grosser, MD 12/08/16 [id number]

## 2016-12-08 NOTE — Telephone Encounter (Signed)
Returned pts call and left her another message for her to call back.

## 2016-12-08 NOTE — ED Notes (Signed)
Respiratory called for neb treatment.

## 2016-12-08 NOTE — Telephone Encounter (Signed)
noted 

## 2016-12-08 NOTE — Telephone Encounter (Signed)
FYI

## 2016-12-08 NOTE — Telephone Encounter (Signed)
Pt returned my call and she has been made aware of her Vas US Carotid.

## 2016-12-14 ENCOUNTER — Ambulatory Visit (INDEPENDENT_AMBULATORY_CARE_PROVIDER_SITE_OTHER): Payer: Medicare Other | Admitting: Nurse Practitioner

## 2016-12-14 ENCOUNTER — Encounter: Payer: Self-pay | Admitting: Nurse Practitioner

## 2016-12-14 VITALS — BP 118/80 | HR 74 | Temp 98.5°F | Ht 66.0 in | Wt 247.5 lb

## 2016-12-14 DIAGNOSIS — G9331 Postviral fatigue syndrome: Secondary | ICD-10-CM

## 2016-12-14 DIAGNOSIS — G933 Postviral fatigue syndrome: Secondary | ICD-10-CM

## 2016-12-14 NOTE — Progress Notes (Signed)
Pre visit review using our clinic review tool, if applicable. No additional management support is needed unless otherwise documented below in the visit note. 

## 2016-12-14 NOTE — Progress Notes (Signed)
Subjective:  Patient ID: Erica [name], female    DOB: 1956/04/19  Age: 61 y.o. MRN: [id number]  CC: Follow-up (ED visit for flu)   Cough  The current episode started in the past 7 days. The problem has been gradually improving. The cough is productive of sputum. Pertinent negatives include no chest pain, chills, fever, headaches, rhinorrhea, sore throat, shortness of breath or wheezing. The symptoms are aggravated by lying down. She has tried a beta-agonist inhaler, steroid inhaler, prescription cough suppressant and OTC cough suppressant for the symptoms. The treatment provided significant relief. Her past medical history is significant for bronchitis.    Outpatient Medications Prior to Visit  Medication Sig Dispense Refill  . ALPRAZolam (XANAX) 0.5 MG tablet Take 1 tablet (0.5 mg total) by mouth 2 (two) times daily as needed for anxiety. 60 tablet 1  . atenolol (TENORMIN) 25 MG tablet Take 65m (1 tablet) in the AM and 5104m(2 tablets) in the PM (Patient taking differently: Take 25 mg by mouth 2 (two) times daily. ) 180 tablet 3  . blood glucose meter kit and supplies KIT Dispense based on patient and insurance preference. Use up to four times daily as directed. (FOR ICD-9 250.00, 250.01). 1 each 0  . budesonide (RHINOCORT AQUA) 32 MCG/ACT nasal spray Place 2 sprays into both nostrils daily. 8.6 g 1  . cetirizine (ZYRTEC ALLERGY) 10 MG tablet Take 1 tablet (10 mg total) by mouth daily. 30 tablet 1  . furosemide (LASIX) 40 MG tablet Take 1 tablet (40 mg total) by mouth 2 (two) times daily. 180 tablet 3  . hydrocortisone (ANUSOL-HC) 2.5 % rectal cream Place rectally as needed. 30 g 3  . linaclotide (LINZESS) 145 MCG CAPS capsule Take 1 capsule (145 mcg total) by mouth daily. 90 capsule 3  . Loratadine (CLARITIN PO) Take 10 tablets by mouth daily as needed (allergies).     . meclizine (ANTIVERT) 25 MG tablet Take 1 tablet (25 mg total) by mouth 3 (three) times daily as needed for dizziness  or nausea. 180 tablet 3  . metFORMIN (GLUCOPHAGE) 500 MG tablet Take 1 tablet (500 mg total) by mouth daily with breakfast. 90 tablet 1  . omeprazole (PRILOSEC) 20 MG capsule Take 1 capsule (20 mg total) by mouth daily. (Patient taking differently: Take 20 mg by mouth at bedtime. ) 90 capsule 3  . potassium chloride SA (K-DUR,KLOR-CON) 20 MEQ tablet Take 1 tablet (20 mEq total) by mouth 2 (two) times daily. 180 tablet 3  . simvastatin (ZOCOR) 40 MG tablet Take 1 tablet (40 mg total) by mouth daily at 6 PM. (Patient taking differently: Take 40 mg by mouth daily. ) 90 tablet 3  . SUMAtriptan-naproxen (TREXIMET) 85-500 MG tablet Take 1 tablet by mouth every 2 (two) hours as needed for migraine. 10 tablet 8  . tizanidine (ZANAFLEX) 2 MG capsule Take 1 capsule (2 mg total) by mouth 3 (three) times daily as needed for muscle spasms. 270 capsule 1  . traMADol (ULTRAM) 50 MG tablet Take 1 tablet (50 mg total) by mouth every 8 (eight) hours as needed. 40 tablet 0  . venlafaxine XR (EFFEXOR-XR) 150 MG 24 hr capsule Take 150 mg by mouth daily with breakfast.     . promethazine (PHENERGAN) 25 MG tablet Take 1 tablet (25 mg total) by mouth every 8 (eight) hours as needed for nausea or vomiting. 20 tablet 0   No facility-administered medications prior to visit.     ROS See  HPI  Objective:  BP 118/80 (BP Location: Left Arm, Patient Position: Sitting, Cuff Size: Large)   Pulse 74   Temp 98.5 F (36.9 C) (Oral)   Ht '5\' 6"'  (1.676 m)   Wt 247 lb 8 oz (112.3 kg)   SpO2 98%   BMI 39.95 kg/m   BP Readings from Last 3 Encounters:  12/14/16 118/80  12/08/16 128/83  12/02/16 130/78    Wt Readings from Last 3 Encounters:  12/14/16 247 lb 8 oz (112.3 kg)  12/08/16 247 lb (112 kg)  12/02/16 247 lb 4 oz (112.2 kg)    Physical Exam  Constitutional: She is oriented to person, place, and time.  HENT:  Right Ear: Tympanic membrane, external ear and ear canal normal.  Left Ear: Tympanic membrane, external  ear and ear canal normal.  Nose: Mucosal edema and rhinorrhea present. Right sinus exhibits no frontal sinus tenderness. Left sinus exhibits no frontal sinus tenderness.  Mouth/Throat: Uvula is midline. No trismus in the jaw. No oropharyngeal exudate.  Eyes: No scleral icterus.  Neck: Normal range of motion. Neck supple.  Cardiovascular: Normal rate and normal heart sounds.   Pulmonary/Chest: Effort normal and breath sounds normal.  Musculoskeletal: She exhibits no edema.  Neurological: She is [name] and oriented to person, place, and time.  Vitals reviewed.   Lab Results  Component Value Date   WBC 7.2 05/07/2016   HGB 12.4 05/07/2016   HCT 37.3 05/07/2016   PLT 249.0 05/07/2016   GLUCOSE 92 09/27/2016   CHOL 184 09/27/2016   TRIG 195.0 (H) 09/27/2016   HDL 44.20 09/27/2016   LDLCALC 100 (H) 09/27/2016   ALT 8 09/27/2016   AST 12 09/27/2016   NA 142 09/27/2016   K 3.9 09/27/2016   CL 106 09/27/2016   CREATININE 0.66 09/27/2016   BUN 16 09/27/2016   CO2 28 09/27/2016   TSH 2.39 09/27/2016   HGBA1C 6.2 09/27/2016   MICROALBUR 1.2 03/23/2016    Dg Chest 2 View  Result Date: 12/08/2016 CLINICAL DATA:  Recent history of the flu, still has cough and sputum production with wheezing EXAM: CHEST  2 VIEW COMPARISON:  Chest x-ray of 05/26/2005 FINDINGS: No active infiltrate or effusion is seen. Mediastinal and hilar contours are unremarkable. The heart is within upper limits of normal in size. No bony abnormality is seen. IMPRESSION: No active cardiopulmonary disease. Electronically Signed   By: Ivar Drape M.D.   On: 12/08/2016 15:40    Assessment & Plan:   Erica [name] was seen today for follow-up.  Diagnoses and all orders for this visit:  Post viral syndrome   I am having Erica [name] maintain her tizanidine, Loratadine (CLARITIN PO), traMADol, venlafaxine XR, blood glucose meter kit and supplies, linaclotide, meclizine, omeprazole, potassium chloride SA, simvastatin,  SUMAtriptan-naproxen, hydrocortisone, ALPRAZolam, metFORMIN, furosemide, atenolol, promethazine, budesonide, and cetirizine.  No orders of the defined types were placed in this encounter.   Follow-up: Return if symptoms worsen or fail to improve.  Wilfred Lacy, NP

## 2016-12-14 NOTE — Patient Instructions (Signed)
Cough, Adult  A cough helps to clear your throat and lungs. A cough may last only 2?3 weeks (acute), or it may last longer than 8 weeks (chronic). Many different things can cause a cough. A cough may be a sign of an illness or another medical condition.  Follow these instructions at home:  ? Pay attention to any changes in your cough.  ? Take medicines only as told by your doctor.  ? If you were prescribed an antibiotic medicine, take it as told by your doctor. Do not stop taking it even if you start to feel better.  ? Talk with your doctor before you try using a cough medicine.  ? Drink enough fluid to keep your pee (urine) clear or pale yellow.  ? If the air is dry, use a cold steam vaporizer or humidifier in your home.  ? Stay away from things that make you cough at work or at home.  ? If your cough is worse at night, try using extra pillows to raise your head up higher while you sleep.  ? Do not smoke, and try not to be around smoke. If you need help quitting, ask your doctor.  ? Do not have caffeine.  ? Do not drink alcohol.  ? Rest as needed.  Contact a doctor if:  ? You have new problems (symptoms).  ? You cough up yellow fluid (pus).  ? Your cough does not get better after 2?3 weeks, or your cough gets worse.  ? Medicine does not help your cough and you are not sleeping well.  ? You have pain that gets worse or pain that is not helped with medicine.  ? You have a fever.  ? You are losing weight and you do not know why.  ? You have night sweats.  Get help right away if:  ? You cough up blood.  ? You have trouble breathing.  ? Your heartbeat is very fast.  This information is not intended to replace advice given to you by your health care provider. Make sure you discuss any questions you have with your health care provider.  Document Released: 06/10/2011 Document Revised: 03/04/2016 Document Reviewed: 12/04/2014  Elsevier Interactive Patient Education ? 2017 [employer]

## 2017-01-13 DIAGNOSIS — F331 Major depressive disorder, recurrent, moderate: Secondary | ICD-10-CM | POA: Diagnosis not present

## 2017-01-13 DIAGNOSIS — F411 Generalized anxiety disorder: Secondary | ICD-10-CM | POA: Diagnosis not present

## 2017-01-13 DIAGNOSIS — F410 Panic disorder [episodic paroxysmal anxiety] without agoraphobia: Secondary | ICD-10-CM | POA: Diagnosis not present

## 2017-01-14 ENCOUNTER — Encounter: Payer: Self-pay | Admitting: Adult Health

## 2017-01-14 ENCOUNTER — Ambulatory Visit (INDEPENDENT_AMBULATORY_CARE_PROVIDER_SITE_OTHER): Payer: Medicare Other | Admitting: Adult Health

## 2017-01-14 DIAGNOSIS — G4733 Obstructive sleep apnea (adult) (pediatric): Secondary | ICD-10-CM | POA: Diagnosis not present

## 2017-01-14 DIAGNOSIS — J301 Allergic rhinitis due to pollen: Secondary | ICD-10-CM

## 2017-01-14 MED ORDER — LEVOCETIRIZINE DIHYDROCHLORIDE 5 MG PO TABS: 5.0000 mg | ORAL_TABLET | Freq: Every evening | ORAL | 1 refills | Status: DC

## 2017-01-14 NOTE — Patient Instructions (Addendum)
Restart CPAP At bedtime  w/ nasal pillows.  Wear each night for 4hr .  Work on weight loss.  Do not drive if sleepy.  Use saline nasal rinses and saline nasal gel (AYR) As needed   Follow up Dr. [name]  In 2 months and As needed

## 2017-01-14 NOTE — Assessment & Plan Note (Signed)
Wants to try xyzal in place of zyrtec  xyzal rx sent to pharm  Add saline nsal rinses/gel.

## 2017-01-14 NOTE — Progress Notes (Signed)
'@Patient'  ID: [name], female    DOB: [birth year], 61 y.o.   MRN: [id number]  Chief Complaint  Patient presents with  . 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  She is maintained on atenolol due to palpitations although atrial fibrillation has not been documented   Download 07/26/13 - on auto 5-20 cm , avg pr 12 cm  No residuals, poor usage  HST 01/2016 20/hr.   01/14/2017 Follow up : OSA  Pt returns for yearly follow up for sleep apnea.  She has been unable to tolerate CPAP in the past. HST was done last year showing worsening OSA w/ AHI at 20/hr . She was referred to dentisit and set up for oral appliance .  She says she is tolerating oral appliance and is helping her snoring . Says Dr. Ron Parker told her she was still having sleep apnea events despite oral appliance. She is worried about cardiovascular side effects. We discussed restarting on CPAP w/ nasal pillows.  Wants to have rx for xyzal to help with nasal drianage, seems to be worse when she starts on CPAP .       Allergies  Allergen Reactions  . Codeine Nausea And Vomiting  . Guaifenesin-Codeine Other (See Comments)    REACTION: feels spacey  . Wellbutrin [Bupropion] Palpitations    hallucinations    Immunization History  Administered Date(s) Administered  . Influenza,inj,Quad PF,36+ Mos 07/25/2013, 09/09/2014, 08/22/2015, 09/27/2016  . Pneumococcal Polysaccharide-23 01/30/2009  . Td 03/17/2004  . Tdap 09/09/2014    Past Medical History:  Diagnosis Date  . Abnormal stress test    a. 09/2016: NST showed a small defect of mild severity present in the basal inferoseptal and mid inferoseptal location,  consistent with ischemia. --> medically managed  . ALLERGIC RHINITIS   . DDD (degenerative disc disease), lumbar    ESI with [facility] (spring 2016)  . Depression   . Diabetes mellitus without complication ([place])   . Dyslipidemia   . External hemorrhoids   . GERD (gastroesophageal reflux disease)   . Hypertension   . Obesity   . Osteoarthritis   . Palpitations    a. prior event monitor showing sinus tachycardia, no PAF.   Marland Kitchen Panic attacks    Hx of depression  . Renal disorder   . Sleep apnea    CPAP hs    Tobacco History: History  Smoking Status  . Never Smoker  Smokeless Tobacco  . Never Used   Counseling given: Not Answered   Outpatient Encounter Prescriptions as of 01/14/2017  Medication Sig  . ALPRAZolam (XANAX) 0.5 MG tablet Take 1 tablet (0.5 mg total) by mouth 2 (two) times daily as needed for anxiety.  Marland Kitchen atenolol (TENORMIN) 25 MG tablet Take 50m (1 tablet) in the AM and 510m(2 tablets) in the PM (Patient taking differently: Take 25 mg by mouth 2 (two) times daily. )  . blood glucose meter kit and supplies KIT Dispense based on patient and insurance preference. Use up to four times daily as directed. (FOR ICD-9 250.00, 250.01).  . budesonide (RHINOCORT AQUA)  32 MCG/ACT nasal spray Place 2 sprays into both nostrils daily.  . cetirizine (ZYRTEC ALLERGY) 10 MG tablet Take 1 tablet (10 mg total) by mouth daily.  . furosemide (LASIX) 40 MG tablet Take 1 tablet (40 mg total) by mouth 2 (two) times daily.  . hydrocortisone (ANUSOL-HC) 2.5 % rectal cream Place rectally as needed.  . linaclotide (LINZESS) 145 MCG CAPS capsule Take 1 capsule (145 mcg total) by mouth daily.  . Loratadine (CLARITIN PO) Take 10 tablets by mouth daily as needed (allergies).   . meclizine (ANTIVERT) 25 MG tablet Take 1 tablet (25 mg total) by mouth 3 (three) times daily as needed for dizziness or nausea.  . metFORMIN (GLUCOPHAGE) 500 MG tablet Take 1 tablet (500 mg total) by mouth daily with breakfast.    . omeprazole (PRILOSEC) 20 MG capsule Take 1 capsule (20 mg total) by mouth daily. (Patient taking differently: Take 20 mg by mouth at bedtime. )  . potassium chloride SA (K-DUR,KLOR-CON) 20 MEQ tablet Take 1 tablet (20 mEq total) by mouth 2 (two) times daily.  . simvastatin (ZOCOR) 40 MG tablet Take 1 tablet (40 mg total) by mouth daily at 6 PM. (Patient taking differently: Take 40 mg by mouth daily. )  . SUMAtriptan-naproxen (TREXIMET) 85-500 MG tablet Take 1 tablet by mouth every 2 (two) hours as needed for migraine.  . tizanidine (ZANAFLEX) 2 MG capsule Take 1 capsule (2 mg total) by mouth 3 (three) times daily as needed for muscle spasms.  . traMADol (ULTRAM) 50 MG tablet Take 1 tablet (50 mg total) by mouth every 8 (eight) hours as needed.  . venlafaxine XR (EFFEXOR-XR) 150 MG 24 hr capsule Take 150 mg by mouth daily with breakfast.   . promethazine (PHENERGAN) 25 MG tablet Take 1 tablet (25 mg total) by mouth every 8 (eight) hours as needed for nausea or vomiting.   No facility-administered encounter medications on file as of 01/14/2017.      Review of Systems  Constitutional:   No  weight loss, night sweats,  Fevers, chills, fatigue, or  lassitude.  HEENT:   No headaches,  Difficulty swallowing,  Tooth/dental problems, or  Sore throat,                No sneezing, itching, ear ache,  +nasal congestion, post nasal drip,   CV:  No chest pain,  Orthopnea, PND, swelling in lower extremities, anasarca, dizziness, palpitations, syncope.   GI  No heartburn, indigestion, abdominal pain, nausea, vomiting, diarrhea, change in bowel habits, loss of appetite, bloody stools.   Resp: No shortness of breath with exertion or at rest.  No excess mucus, no productive cough,  No non-productive cough,  No coughing up of blood.  No change in color of mucus.  No wheezing.  No chest wall deformity  Skin: no rash or lesions.  GU: no dysuria, change in color of urine, no urgency or frequency.  No flank  pain, no hematuria   MS:  No joint pain or swelling.  No decreased range of motion.  No back pain.    Physical Exam  BP 124/68 (BP Location: Left Arm, Cuff Size: Normal)   Pulse 68   Ht '5\' 7"'  (1.702 m)   Wt 250 lb (113.4 kg)   SpO2 98%   BMI 39.16 kg/m   GEN: A/Ox3; pleasant , NAD, obese    HEENT:  [place]/AT,  EACs-clear, TMs-wnl, NOSE-clear drainage , THROAT-clear, no lesions, no postnasal drip or exudate noted. Class  2 MP airway   NECK:  Supple w/ fair ROM; no JVD; normal carotid impulses w/o bruits; no thyromegaly or nodules palpated; no lymphadenopathy.    RESP  Clear  P & A; w/o, wheezes/ rales/ or rhonchi. no accessory muscle use, no dullness to percussion  CARD:  RRR, no m/r/g, no peripheral edema, pulses intact, no cyanosis or clubbing.  GI:   Soft & nt; nml bowel sounds; no organomegaly or masses detected.   Musco: Warm bil, no deformities or joint swelling noted.   Neuro: alert, no focal deficits noted.    Skin: Warm, no lesions or rashes     Lab Results:  CBC    Component Value Date/Time   WBC 7.2 05/07/2016 1229   RBC 4.45 05/07/2016 1229   HGB 12.4 05/07/2016 1229   HCT 37.3 05/07/2016 1229   PLT 249.0 05/07/2016 1229   MCV 83.9 05/07/2016 1229   MCHC 33.1 05/07/2016 1229   RDW 14.2 05/07/2016 1229   LYMPHSABS 1.9 05/07/2016 1229   MONOABS 0.5 05/07/2016 1229   EOSABS 0.1 05/07/2016 1229   BASOSABS 0.0 05/07/2016 1229    BMET    Component Value Date/Time   NA 142 09/27/2016 1608   K 3.9 09/27/2016 1608   CL 106 09/27/2016 1608   CO2 28 09/27/2016 1608   GLUCOSE 92 09/27/2016 1608   GLUCOSE 89 09/20/2006 0915   BUN 16 09/27/2016 1608   CREATININE 0.66 09/27/2016 1608   CALCIUM 9.3 09/27/2016 1608   GFRNONAA >60 12/26/2015 1145   GFRAA >60 12/26/2015 1145    BNP No results found for: BNP  ProBNP    Component Value Date/Time   PROBNP 27.0 12/31/2014 1213    Imaging: No results found.   Assessment & Plan:   Obstructive sleep  apnea Moderate OSA - trouble tolerating CPAP in past. Oral appliance improves snoring but not apneic events  Will try to restart CPAP -auto set 5-20 , nasal pillows with chin strap.  Plan Patient Instructions  Restart CPAP At bedtime  w/ nasal pillows.  Wear each night for 4hr .  Work on weight loss.  Do not drive if sleepy.  Use saline nasal rinses and saline nasal gel (AYR) As needed   Follow up Dr. [name]  In 2 months and As needed      Allergic rhinitis Wants to try xyzal in place of zyrtec  xyzal rx sent to pharm  Add saline nsal rinses/gel.      Rexene Edison, NP 01/14/2017

## 2017-01-14 NOTE — Assessment & Plan Note (Signed)
Moderate OSA - trouble tolerating CPAP in past. Oral appliance improves snoring but not apneic events  Will try to restart CPAP -auto set 5-20 , nasal pillows with chin strap.  Plan Patient Instructions  Restart CPAP At bedtime  w/ nasal pillows.  Wear each night for 4hr .  Work on weight loss.  Do not drive if sleepy.  Use saline nasal rinses and saline nasal gel (AYR) As needed   Follow up Dr. [name]  In 2 months and As needed

## 2017-01-14 NOTE — Addendum Note (Signed)
Addended by: Parke Poisson E on: 01/14/2017 10:56 AM   Modules accepted: Orders

## 2017-01-19 ENCOUNTER — Telehealth: Payer: Self-pay | Admitting: Internal Medicine

## 2017-01-19 NOTE — Telephone Encounter (Signed)
Called patient to schedule awv. Left msg for patient to call office to schedule appt.  °

## 2017-02-21 ENCOUNTER — Telehealth: Payer: Self-pay | Admitting: Pulmonary Disease

## 2017-02-21 DIAGNOSIS — G4733 Obstructive sleep apnea (adult) (pediatric): Secondary | ICD-10-CM

## 2017-02-21 NOTE — Telephone Encounter (Signed)
Call [facility] back on this [phone].Erica [name]

## 2017-02-21 NOTE — Telephone Encounter (Signed)
Called to let pt know that [facility] has been called they are checking on this and will call back

## 2017-02-21 NOTE — Telephone Encounter (Signed)
Spoke with Erica [name], who states because pt had a break in her therapy and has not been compliance. Pt will need to  have a repeat sleep study per insurance.  I have spoken with pt and made her aware that a sleep study is needed.   RA please advise. Thanks   Moderate OSA - trouble tolerating CPAP in past. Oral appliance improves snoring but not apneic events  Will try to restart CPAP -auto set 5-20 , nasal pillows with chin strap.  Plan Patient Instructions  Restart CPAP At bedtime  w/ nasal pillows.  Wear each night for 4hr .  Work on weight loss.  Do not drive if sleepy.  Use saline nasal rinses and saline nasal gel (AYR) As needed   Follow up Dr. [name]  In 2 months and As needed

## 2017-02-22 NOTE — Telephone Encounter (Signed)
Seems like She had repeat HSt done by dr Ron Parker - pl obtian that & should be enough If not, then OK to repeat HST

## 2017-02-23 NOTE — Telephone Encounter (Signed)
I called and spoke with Dr Ron Parker office  She did not have a HST done there  Will order a new one

## 2017-03-08 ENCOUNTER — Other Ambulatory Visit: Payer: Self-pay | Admitting: Internal Medicine

## 2017-03-08 ENCOUNTER — Ambulatory Visit (INDEPENDENT_AMBULATORY_CARE_PROVIDER_SITE_OTHER): Payer: Medicare Other | Admitting: Nurse Practitioner

## 2017-03-08 ENCOUNTER — Telehealth: Payer: Self-pay | Admitting: Internal Medicine

## 2017-03-08 ENCOUNTER — Other Ambulatory Visit: Payer: Self-pay | Admitting: Family Medicine

## 2017-03-08 ENCOUNTER — Encounter: Payer: Self-pay | Admitting: Nurse Practitioner

## 2017-03-08 ENCOUNTER — Ambulatory Visit (INDEPENDENT_AMBULATORY_CARE_PROVIDER_SITE_OTHER)
Admission: RE | Admit: 2017-03-08 | Discharge: 2017-03-08 | Disposition: A | Discharge status: Home / Self Care | Payer: Medicare Other | Source: Ambulatory Visit | Attending: Nurse Practitioner | Admitting: Nurse Practitioner

## 2017-03-08 VITALS — BP 130/84 | HR 75 | Temp 98.5°F | Ht 67.0 in | Wt 241.0 lb

## 2017-03-08 DIAGNOSIS — R0602 Shortness of breath: Secondary | ICD-10-CM

## 2017-03-08 DIAGNOSIS — F339 Major depressive disorder, recurrent, unspecified: Secondary | ICD-10-CM

## 2017-03-08 DIAGNOSIS — J069 Acute upper respiratory infection, unspecified: Secondary | ICD-10-CM

## 2017-03-08 DIAGNOSIS — R110 Nausea: Secondary | ICD-10-CM

## 2017-03-08 DIAGNOSIS — R61 Generalized hyperhidrosis: Secondary | ICD-10-CM | POA: Diagnosis not present

## 2017-03-08 DIAGNOSIS — R05 Cough: Secondary | ICD-10-CM | POA: Diagnosis not present

## 2017-03-08 MED ORDER — VENLAFAXINE HCL ER 150 MG PO CP24: 150.0000 mg | ORAL_CAPSULE | Freq: Every day | ORAL | 0 refills | Status: DC

## 2017-03-08 MED ORDER — BENZONATATE 100 MG PO CAPS: 100.0000 mg | ORAL_CAPSULE | Freq: Three times a day (TID) | ORAL | 0 refills | Status: DC | PRN

## 2017-03-08 MED ORDER — GUAIFENESIN-DM 100-10 MG/5ML PO SYRP: 5.0000 mL | ORAL_SOLUTION | ORAL | 0 refills | Status: DC | PRN

## 2017-03-08 MED ORDER — ALBUTEROL SULFATE HFA 108 (90 BASE) MCG/ACT IN AERS: 2.0000 | INHALATION_SPRAY | Freq: Four times a day (QID) | RESPIRATORY_TRACT | 0 refills | Status: DC | PRN

## 2017-03-08 NOTE — Telephone Encounter (Signed)
Pt would like all med's sent today sent  to [facility] at [address].

## 2017-03-08 NOTE — Telephone Encounter (Signed)
Denied.  Pt should contact her PCP.  Message sent to the pharmacy.

## 2017-03-08 NOTE — Telephone Encounter (Signed)
Spoke with rep through [facility], a note was placed on patients file to cancel RXs when they come through. Meds resent to [facility].

## 2017-03-08 NOTE — Patient Instructions (Addendum)
Normal CXR.  Please contact Psychiatrist about temporary xanax prescription.

## 2017-03-08 NOTE — Progress Notes (Signed)
Subjective:  Patient ID: Erica [name] Alert, female    DOB: [birth year]  Age: 61 y.o. MRN: [id number]  CC: Medication Refill (withdraw from xnax and depression med for 5 days--mail order is processing this for 14 days. chest congestion 3 days. )  Cough  This is a new problem. The current episode started in the past 7 days. The problem has been unchanged. The problem occurs constantly. The cough is non-productive. Associated symptoms include chills, a sore throat, shortness of breath, sweats and wheezing. Pertinent negatives include no chest pain, fever, nasal congestion or postnasal drip. The symptoms are aggravated by lying down and cold air. She has tried nothing for the symptoms. Her past medical history is significant for asthma and environmental allergies.    Anxiety: Has been out of xanax and effexor x 5days. States she has not been able to reach anyone in psychiatrist office. Xanax prescription was mailed and will not be getting refill in another 1week. Last prescription filled was 11/2016, 180tabs dispensed.  Outpatient Medications Prior to Visit  Medication Sig Dispense Refill  . ALPRAZolam (XANAX) 0.5 MG tablet Take 1 tablet (0.5 mg total) by mouth 2 (two) times daily as needed for anxiety. 60 tablet 1  . atenolol (TENORMIN) 25 MG tablet Take 42m (1 tablet) in the AM and 579m(2 tablets) in the PM (Patient taking differently: Take 25 mg by mouth 2 (two) times daily. ) 180 tablet 3  . blood glucose meter kit and supplies KIT Dispense based on patient and insurance preference. Use up to four times daily as directed. (FOR ICD-9 250.00, 250.01). 1 each 0  . budesonide (RHINOCORT AQUA) 32 MCG/ACT nasal spray Place 2 sprays into both nostrils daily. 8.6 g 1  . cetirizine (ZYRTEC ALLERGY) 10 MG tablet Take 1 tablet (10 mg total) by mouth daily. 30 tablet 1  . furosemide (LASIX) 40 MG tablet Take 1 tablet (40 mg total) by mouth 2 (two) times daily. 180 tablet 3  . hydrocortisone (ANUSOL-HC)  2.5 % rectal cream Place rectally as needed. 30 g 3  . levocetirizine (XYZAL) 5 MG tablet Take 1 tablet (5 mg total) by mouth every evening. 90 tablet 1  . linaclotide (LINZESS) 145 MCG CAPS capsule Take 1 capsule (145 mcg total) by mouth daily. 90 capsule 3  . Loratadine (CLARITIN PO) Take 10 tablets by mouth daily as needed (allergies).     . meclizine (ANTIVERT) 25 MG tablet Take 1 tablet (25 mg total) by mouth 3 (three) times daily as needed for dizziness or nausea. 180 tablet 3  . metFORMIN (GLUCOPHAGE) 500 MG tablet Take 1 tablet (500 mg total) by mouth daily with breakfast. 90 tablet 1  . omeprazole (PRILOSEC) 20 MG capsule Take 1 capsule (20 mg total) by mouth daily. (Patient taking differently: Take 20 mg by mouth at bedtime. ) 90 capsule 3  . potassium chloride SA (K-DUR,KLOR-CON) 20 MEQ tablet Take 1 tablet (20 mEq total) by mouth 2 (two) times daily. 180 tablet 3  . simvastatin (ZOCOR) 40 MG tablet Take 1 tablet (40 mg total) by mouth daily at 6 PM. (Patient taking differently: Take 40 mg by mouth daily. ) 90 tablet 3  . SUMAtriptan-naproxen (TREXIMET) 85-500 MG tablet Take 1 tablet by mouth every 2 (two) hours as needed for migraine. 10 tablet 8  . tizanidine (ZANAFLEX) 2 MG capsule Take 1 capsule (2 mg total) by mouth 3 (three) times daily as needed for muscle spasms. 270 capsule 1  . traMADol (  ULTRAM) 50 MG tablet Take 1 tablet (50 mg total) by mouth every 8 (eight) hours as needed. 40 tablet 0  . venlafaxine XR (EFFEXOR-XR) 150 MG 24 hr capsule Take 150 mg by mouth daily with breakfast.     . promethazine (PHENERGAN) 25 MG tablet Take 1 tablet (25 mg total) by mouth every 8 (eight) hours as needed for nausea or vomiting. 20 tablet 0   No facility-administered medications prior to visit.     ROS See HPI  Objective:  BP 130/84   Pulse 75   Temp 98.5 F (36.9 C)   Ht '5\' 7"'  (1.702 m)   Wt 241 lb (109.3 kg)   SpO2 98%   BMI 37.75 kg/m   BP Readings from Last 3 Encounters:    03/08/17 130/84  01/14/17 124/68  12/14/16 118/80    Wt Readings from Last 3 Encounters:  03/08/17 241 lb (109.3 kg)  01/14/17 250 lb (113.4 kg)  12/14/16 247 lb 8 oz (112.3 kg)    Physical Exam  Constitutional: She is oriented to person, place, and time.  HENT:  Right Ear: Tympanic membrane, external ear and ear canal normal.  Left Ear: Tympanic membrane, external ear and ear canal normal.  Mouth/Throat: Uvula is midline. No trismus in the jaw. Posterior oropharyngeal erythema present. No oropharyngeal exudate.  Eyes: No scleral icterus.  Neck: Normal range of motion. Neck supple.  Cardiovascular: Normal rate and normal heart sounds.   Pulmonary/Chest: Effort normal and breath sounds normal.  Musculoskeletal: She exhibits no edema.  Lymphadenopathy:    She has no cervical adenopathy.  Neurological: She is alert and oriented to person, place, and time.  Vitals reviewed.   Lab Results  Component Value Date   WBC 7.2 05/07/2016   HGB 12.4 05/07/2016   HCT 37.3 05/07/2016   PLT 249.0 05/07/2016   GLUCOSE 92 09/27/2016   CHOL 184 09/27/2016   TRIG 195.0 (H) 09/27/2016   HDL 44.20 09/27/2016   LDLCALC 100 (H) 09/27/2016   ALT 8 09/27/2016   AST 12 09/27/2016   NA 142 09/27/2016   K 3.9 09/27/2016   CL 106 09/27/2016   CREATININE 0.66 09/27/2016   BUN 16 09/27/2016   CO2 28 09/27/2016   TSH 2.39 09/27/2016   HGBA1C 6.2 09/27/2016   MICROALBUR 1.2 03/23/2016    Dg Chest 2 View  Result Date: 12/08/2016 CLINICAL DATA:  Recent history of the flu, still has cough and sputum production with wheezing EXAM: CHEST  2 VIEW COMPARISON:  Chest x-ray of 05/26/2005 FINDINGS: No active infiltrate or effusion is seen. Mediastinal and hilar contours are unremarkable. The heart is within upper limits of normal in size. No bony abnormality is seen. IMPRESSION: No active cardiopulmonary disease. Electronically Signed   By: Ivar Drape M.D.   On: 12/08/2016 15:40    Assessment & Plan:    [place] was seen today for medication refill.  Diagnoses and all orders for this visit:  Acute URI -     Discontinue: guaiFENesin-dextromethorphan (ROBITUSSIN DM) 100-10 MG/5ML syrup; Take 5 mLs by mouth every 4 (four) hours as needed for cough. -     Discontinue: albuterol (PROVENTIL HFA;VENTOLIN HFA) 108 (90 Base) MCG/ACT inhaler; Inhale 2 puffs into the lungs every 6 (six) hours as needed for wheezing or shortness of breath. -     Discontinue: benzonatate (TESSALON) 100 MG capsule; Take 1 capsule (100 mg total) by mouth 3 (three) times daily as needed for cough. -  DG Chest 2 View; Future  SOB (shortness of breath) -     DG Chest 2 View; Future  Night sweats -     DG Chest 2 View; Future  Recurrent major depressive disorder, remission status unspecified (HCC) -     Discontinue: venlafaxine XR (EFFEXOR-XR) 150 MG 24 hr capsule; Take 1 capsule (150 mg total) by mouth daily with breakfast.   I have discontinued Ms. Buskey's venlafaxine XR. I am also having her maintain her tizanidine, Loratadine (CLARITIN PO), traMADol, blood glucose meter kit and supplies, linaclotide, meclizine, omeprazole, potassium chloride SA, simvastatin, SUMAtriptan-naproxen, hydrocortisone, ALPRAZolam, metFORMIN, furosemide, atenolol, promethazine, budesonide, cetirizine, and levocetirizine.  Meds ordered this encounter  Medications  . DISCONTD: guaiFENesin-dextromethorphan (ROBITUSSIN DM) 100-10 MG/5ML syrup    Sig: Take 5 mLs by mouth every 4 (four) hours as needed for cough.    Dispense:  118 mL    Refill:  0    Order Specific Question:   Supervising Provider    Answer:   Cassandria Anger [[id number]]  . DISCONTD: albuterol (PROVENTIL HFA;VENTOLIN HFA) 108 (90 Base) MCG/ACT inhaler    Sig: Inhale 2 puffs into the lungs every 6 (six) hours as needed for wheezing or shortness of breath.    Dispense:  1 Inhaler    Refill:  0    Order Specific Question:   Supervising Provider    Answer:   Cassandria Anger [[id number]]  . DISCONTD: benzonatate (TESSALON) 100 MG capsule    Sig: Take 1 capsule (100 mg total) by mouth 3 (three) times daily as needed for cough.    Dispense:  20 capsule    Refill:  0    Order Specific Question:   Supervising Provider    Answer:   Cassandria Anger [[id number]]  . DISCONTD: venlafaxine XR (EFFEXOR-XR) 150 MG 24 hr capsule    Sig: Take 1 capsule (150 mg total) by mouth daily with breakfast.    Dispense:  14 capsule    Refill:  0    Order Specific Question:   Supervising Provider    Answer:   Cassandria Anger [[id number]]    Follow-up: Return if symptoms worsen or fail to improve.  Wilfred Lacy, NP

## 2017-03-09 ENCOUNTER — Telehealth: Payer: Self-pay | Admitting: Emergency Medicine

## 2017-03-09 MED ORDER — TRAMADOL HCL 50 MG PO TABS: 50.0000 mg | ORAL_TABLET | Freq: Three times a day (TID) | ORAL | 0 refills | Status: DC | PRN

## 2017-03-09 NOTE — Telephone Encounter (Signed)
RX for Tramadol faxed to [facility]

## 2017-03-21 DIAGNOSIS — G4733 Obstructive sleep apnea (adult) (pediatric): Secondary | ICD-10-CM | POA: Diagnosis not present

## 2017-03-26 NOTE — Progress Notes (Signed)
Subjective:    Patient ID: Erica [name], female    DOB: [birth year], 61 y.o.   MRN: [id number]  HPI The patient is here for follow up.  Hypertension: She is taking her medication daily. She is compliant with a low sodium diet.  She denies chest pain, palpitations, edema, shortness of breath and regular headaches. She is not exercising regularly.      Diabetes: She is taking her medication daily as prescribed. She is compliant with a diabetic diet. She is exercising regularly. She monitors her sugars and they have been running 145 fasting today and some have been lower. She checks her feet daily and denies foot lesions.    GERD:  She is taking her medication daily as prescribed.  She denies any GERD symptoms and feels her GERD is well controlled.   Hyperlipidemia: She is taking her medication daily. She is compliant with a low fat/cholesterol diet. She is not exercising regularly. She denies myalgias.   Anxiety: She is taking her medication daily as prescribed. She denies any side effects from the medication. She feels her anxiety is well controlled and she is happy with her current dose of medication.   URI:  She is still coughing up clear phlegm.  She has some wheezing at night.  She has nasal congestion.  She denies sob, fever, sinus pain and sore throat.    Constipation:  Taking linzess daily, but still feel constipated.  She is unsure why it has not been working as well.    Medications and allergies reviewed with patient and updated if appropriate.  Patient Active Problem List   Diagnosis Date Noted  . Panic attacks 09/27/2016  . GERD (gastroesophageal reflux disease) 09/27/2016  . Chest pain 08/23/2016  . Grief reaction 08/23/2016  . Right ankle pain 08/19/2016  . RLQ abdominal pain 05/08/2016  . Diabetes ([place]) 02/15/2016  . Obesity 02/13/2016  . Left leg pain 11/28/2015  . Skin nodule 11/28/2015  . Migraine without aura and with status migrainosus, not intractable  10/03/2015  . Numbness of left hand 10/03/2015  . Cephalalgia 10/03/2015  . Abnormal ECG 09/11/2012  . Palpitations 09/08/2012  . Sciatica of left side   . Peripheral edema 05/09/2012  . FATIGUE 02/23/2010  . Obstructive sleep apnea 10/31/2009  . Morbid obesity ([place]) 10/17/2009  . HEMORRHOIDS, EXTERNAL 10/17/2009  . CONSTIPATION, CHRONIC 10/17/2009  . URTICARIA 03/11/2009  . Hyperlipidemia 01/12/2008  . Depression, major, recurrent ([place]) 01/12/2008  . Essential hypertension, benign 01/12/2008  . Allergic rhinitis 01/12/2008    Current Outpatient Prescriptions on File Prior to Visit  Medication Sig Dispense Refill  . albuterol (PROVENTIL HFA;VENTOLIN HFA) 108 (90 Base) MCG/ACT inhaler Inhale 2 puffs into the lungs every 6 (six) hours as needed for wheezing or shortness of breath. 1 Inhaler 0  . ALPRAZolam (XANAX) 0.5 MG tablet Take 1 tablet (0.5 mg total) by mouth 2 (two) times daily as needed for anxiety. 60 tablet 1  . atenolol (TENORMIN) 25 MG tablet Take 66m (1 tablet) in the AM and 515m(2 tablets) in the PM (Patient taking differently: Take 25 mg by mouth 2 (two) times daily. ) 180 tablet 3  . blood glucose meter kit and supplies KIT Dispense based on patient and insurance preference. Use up to four times daily as directed. (FOR ICD-9 250.00, 250.01). 1 each 0  . budesonide (RHINOCORT AQUA) 32 MCG/ACT nasal spray Place 2 sprays into both nostrils daily. 8.6 g 1  . furosemide (LASIX)  40 MG tablet Take 1 tablet (40 mg total) by mouth 2 (two) times daily. 180 tablet 3  . hydrocortisone (ANUSOL-HC) 2.5 % rectal cream Place rectally as needed. 30 g 3  . levocetirizine (XYZAL) 5 MG tablet Take 1 tablet (5 mg total) by mouth every evening. 90 tablet 1  . linaclotide (LINZESS) 145 MCG CAPS capsule Take 1 capsule (145 mcg total) by mouth daily. 90 capsule 3  . meclizine (ANTIVERT) 25 MG tablet Take 1 tablet (25 mg total) by mouth 3 (three) times daily as needed for dizziness or nausea. 180  tablet 3  . metFORMIN (GLUCOPHAGE) 500 MG tablet Take 1 tablet (500 mg total) by mouth daily with breakfast. 90 tablet 1  . omeprazole (PRILOSEC) 20 MG capsule Take 1 capsule (20 mg total) by mouth daily. (Patient taking differently: Take 20 mg by mouth at bedtime. ) 90 capsule 3  . potassium chloride SA (K-DUR,KLOR-CON) 20 MEQ tablet Take 1 tablet (20 mEq total) by mouth 2 (two) times daily. 180 tablet 3  . simvastatin (ZOCOR) 40 MG tablet Take 1 tablet (40 mg total) by mouth daily at 6 PM. (Patient taking differently: Take 40 mg by mouth daily. ) 90 tablet 3  . SUMAtriptan-naproxen (TREXIMET) 85-500 MG tablet Take 1 tablet by mouth every 2 (two) hours as needed for migraine. 10 tablet 8  . tizanidine (ZANAFLEX) 2 MG capsule Take 1 capsule (2 mg total) by mouth 3 (three) times daily as needed for muscle spasms. 270 capsule 1  . traMADol (ULTRAM) 50 MG tablet Take 1 tablet (50 mg total) by mouth every 8 (eight) hours as needed. 40 tablet 0  . venlafaxine XR (EFFEXOR-XR) 150 MG 24 hr capsule Take 1 capsule (150 mg total) by mouth daily with breakfast. 14 capsule 0  . promethazine (PHENERGAN) 25 MG tablet Take 1 tablet (25 mg total) by mouth every 8 (eight) hours as needed for nausea or vomiting. 20 tablet 0  . [DISCONTINUED] oxycodone (OXY-IR) 5 MG capsule Take 5 mg by mouth every 4 (four) hours as needed. For pain.     No current facility-administered medications on file prior to visit.     Past Medical History:  Diagnosis Date  . Abnormal stress test    a. 09/2016: NST showed a small defect of mild severity present in the basal inferoseptal and mid inferoseptal location, consistent with ischemia. --> medically managed  . ALLERGIC RHINITIS   . DDD (degenerative disc disease), lumbar    ESI with Ramos (spring 2016)  . Depression   . Diabetes mellitus without complication ([place])   . Dyslipidemia   . External hemorrhoids   . GERD (gastroesophageal reflux disease)   . Hypertension   . Obesity    . Osteoarthritis   . Palpitations    a. prior event monitor showing sinus tachycardia, no PAF.   Marland Kitchen Panic attacks    Hx of depression  . Renal disorder   . Sleep apnea    CPAP hs    Past Surgical History:  Procedure Laterality Date  . ABDOMINAL HYSTERECTOMY    . MASS EXCISION Left 12/30/2015   Procedure: EXCISION LEFT LEG MASS;  Surgeon: Donnie Mesa, MD;  Location: [facility];  Service: General;  Laterality: Left;  . REPLACEMENT TOTAL KNEE Right   . RIGHT OOPHORECTOMY     No cancer    Social History   Social History  . Marital status: Married    Spouse name: N/A  . Number of  children: 3  . Years of education: N/A   Occupational History  . Disabled    Social History Main Topics  . Smoking status: Never Smoker  . Smokeless tobacco: Never Used  . Alcohol use 0.6 oz/week    1 Glasses of wine per week     Comment: one drink a month  . Drug use: No  . Sexual activity: Not Currently   Other Topics Concern  . Not on file   Social History Narrative   Married with children. Pt is on disablity. Previous worked in the school system    Family History  Problem Relation Age of Onset  . Heart disease Mother        Enlarged Heart, Pacemaker  . Diabetes Mother   . Thyroid disease Mother   . Dementia Father   . Kidney failure Father   . Prostate cancer Father   . Asthma Other   . Allergies Sister   . Asthma Son   . Breast cancer Maternal Aunt        cousin  . Colon cancer Cousin   . Heart disease Son        CHF, morbid obesity  . Prostate cancer Maternal Uncle   . Diabetes Son     Review of Systems  Constitutional: Negative for fever.  HENT: Positive for congestion. Negative for sinus pressure and sore throat.   Respiratory: Positive for cough and wheezing. Negative for shortness of breath.   Cardiovascular: Negative for chest pain, palpitations and leg swelling.  Neurological: Positive for light-headedness and headaches (with cold symptoms).        Objective:   Vitals:   03/28/17 1114  BP: 126/72  Pulse: 72  Resp: 16  Temp: 99 F (37.2 C)   Wt Readings from Last 3 Encounters:  03/28/17 238 lb (108 kg)  03/08/17 241 lb (109.3 kg)  01/14/17 250 lb (113.4 kg)   Body mass index is 37.28 kg/m.   Physical Exam    Constitutional: Appears well-developed and well-nourished. No distress.  HENT:  Head: Normocephalic and atraumatic.  Neck: b/l ear canals and Tm normal.  Neck supple. No tracheal deviation present. No thyromegaly present.  No cervical lymphadenopathy Cardiovascular: Normal rate, regular rhythm and normal heart sounds.   No murmur heard. No carotid bruit .  No edema Pulmonary/Chest: Effort normal and breath sounds normal. No respiratory distress. No has no wheezes. No rales.  Skin: Skin is warm and dry. Not diaphoretic.  Psychiatric: Normal mood and affect. Behavior is normal.   Diabetic Foot Exam - Simple   Simple Foot Form Diabetic Foot exam was performed with the following findings:  Yes 03/28/2017 11:37 AM  Visual Inspection No deformities, no ulcerations, no other skin breakdown bilaterally:  Yes Sensation Testing Intact to touch and monofilament testing bilaterally:  Yes Pulse Check Posterior Tibialis and Dorsalis pulse intact bilaterally:  Yes Comments Onychomycosis right first toe nail       Assessment & Plan:    See Problem List for Assessment and Plan of chronic medical problems.

## 2017-03-27 NOTE — Progress Notes (Signed)
Pre visit review using our clinic review tool, if applicable. No additional management support is needed unless otherwise documented below in the visit note. 

## 2017-03-27 NOTE — Progress Notes (Deleted)
Subjective:   Erica [name] is a 61 y.o. female who presents for Medicare Annual (Subsequent) preventive examination.  Review of Systems:  No ROS.  Medicare Wellness Visit. Additional risk factors are reflected in the social history.    Sleep patterns: {SX; SLEEP PATTERNS:18802::"feels rested on waking","does not get up to void","gets up *** times nightly to void","sleeps *** hours nightly"}.   Home Safety/Smoke Alarms: Feels safe in home. Smoke alarms in place.  Living environment; residence and Firearm Safety: {Rehab home environment / accessibility:30080::"no firearms","firearms stored safely"}. Seat Belt Safety/Bike Helmet: Wears seat belt.   Counseling:   Eye Exam-  Dental-  Female:   Pap-  N/A hysterectomy    Mammo- Last 09/10/14,  BI-RADS CATEGORY  1: Negative     Dexa scan-  N/D      CCS- Last 05/08/14, normal recall 10 years       Objective:     Vitals: There were no vitals taken for this visit.  There is no height or weight on file to calculate BMI.   Tobacco History  Smoking Status  . Never Smoker  Smokeless Tobacco  . Never Used     Counseling given: Not Answered   Past Medical History:  Diagnosis Date  . Abnormal stress test    a. 09/2016: NST showed a small defect of mild severity present in the basal inferoseptal and mid inferoseptal location, consistent with ischemia. --> medically managed  . ALLERGIC RHINITIS   . DDD (degenerative disc disease), lumbar    ESI with Ramos (spring 2016)  . Depression   . Diabetes mellitus without complication ([place])   . Dyslipidemia   . External hemorrhoids   . GERD (gastroesophageal reflux disease)   . Hypertension   . Obesity   . Osteoarthritis   . Palpitations    a. prior event monitor showing sinus tachycardia, no PAF.   Marland Kitchen Panic attacks    Hx of depression  . Renal disorder   . Sleep apnea    CPAP hs   Past Surgical History:  Procedure Laterality Date  . ABDOMINAL HYSTERECTOMY    . MASS  EXCISION Left 12/30/2015   Procedure: EXCISION LEFT LEG MASS;  Surgeon: Donnie Mesa, MD;  Location: [facility];  Service: General;  Laterality: Left;  . REPLACEMENT TOTAL KNEE Right   . RIGHT OOPHORECTOMY     No cancer   Family History  Problem Relation Age of Onset  . Heart disease Mother        Enlarged Heart, Pacemaker  . Diabetes Mother   . Thyroid disease Mother   . Dementia Father   . Kidney failure Father   . Prostate cancer Father   . Asthma Other   . Allergies Sister   . Asthma Son   . Breast cancer Maternal Aunt        cousin  . Colon cancer Cousin   . Heart disease Son        CHF, morbid obesity  . Prostate cancer Maternal Uncle   . Diabetes Son    History  Sexual Activity  . Sexual activity: Not Currently    Outpatient Encounter Prescriptions as of 03/28/2017  Medication Sig  . albuterol (PROVENTIL HFA;VENTOLIN HFA) 108 (90 Base) MCG/ACT inhaler Inhale 2 puffs into the lungs every 6 (six) hours as needed for wheezing or shortness of breath.  . ALPRAZolam (XANAX) 0.5 MG tablet Take 1 tablet (0.5 mg total) by mouth 2 (two) times daily as needed  for anxiety.  Marland Kitchen atenolol (TENORMIN) 25 MG tablet Take 18m (1 tablet) in the AM and 572m(2 tablets) in the PM (Patient taking differently: Take 25 mg by mouth 2 (two) times daily. )  . blood glucose meter kit and supplies KIT Dispense based on patient and insurance preference. Use up to four times daily as directed. (FOR ICD-9 250.00, 250.01).  . budesonide (RHINOCORT AQUA) 32 MCG/ACT nasal spray Place 2 sprays into both nostrils daily.  . cetirizine (ZYRTEC ALLERGY) 10 MG tablet Take 1 tablet (10 mg total) by mouth daily.  . furosemide (LASIX) 40 MG tablet Take 1 tablet (40 mg total) by mouth 2 (two) times daily.  . hydrocortisone (ANUSOL-HC) 2.5 % rectal cream Place rectally as needed.  . Marland Kitchenevocetirizine (XYZAL) 5 MG tablet Take 1 tablet (5 mg total) by mouth every evening.  . linaclotide (LINZESS) 145 MCG  CAPS capsule Take 1 capsule (145 mcg total) by mouth daily.  . Loratadine (CLARITIN PO) Take 10 tablets by mouth daily as needed (allergies).   . meclizine (ANTIVERT) 25 MG tablet Take 1 tablet (25 mg total) by mouth 3 (three) times daily as needed for dizziness or nausea.  . metFORMIN (GLUCOPHAGE) 500 MG tablet Take 1 tablet (500 mg total) by mouth daily with breakfast.  . omeprazole (PRILOSEC) 20 MG capsule Take 1 capsule (20 mg total) by mouth daily. (Patient taking differently: Take 20 mg by mouth at bedtime. )  . potassium chloride SA (K-DUR,KLOR-CON) 20 MEQ tablet Take 1 tablet (20 mEq total) by mouth 2 (two) times daily.  . promethazine (PHENERGAN) 25 MG tablet Take 1 tablet (25 mg total) by mouth every 8 (eight) hours as needed for nausea or vomiting.  . simvastatin (ZOCOR) 40 MG tablet Take 1 tablet (40 mg total) by mouth daily at 6 PM. (Patient taking differently: Take 40 mg by mouth daily. )  . SUMAtriptan-naproxen (TREXIMET) 85-500 MG tablet Take 1 tablet by mouth every 2 (two) hours as needed for migraine.  . tizanidine (ZANAFLEX) 2 MG capsule Take 1 capsule (2 mg total) by mouth 3 (three) times daily as needed for muscle spasms.  . traMADol (ULTRAM) 50 MG tablet Take 1 tablet (50 mg total) by mouth every 8 (eight) hours as needed.  . venlafaxine XR (EFFEXOR-XR) 150 MG 24 hr capsule Take 1 capsule (150 mg total) by mouth daily with breakfast.  . [DISCONTINUED] benzonatate (TESSALON) 100 MG capsule Take 1 capsule (100 mg total) by mouth 3 (three) times daily as needed for cough.  . [DISCONTINUED] guaiFENesin-dextromethorphan (ROBITUSSIN DM) 100-10 MG/5ML syrup Take 5 mLs by mouth every 4 (four) hours as needed for cough.   No facility-administered encounter medications on file as of 03/28/2017.     Activities of Daily Living No flowsheet data found.  Patient Care Team: BuBinnie RailMD as PCP - General (Internal Medicine) BrLafayette DragonMD (Inactive) as Consulting Physician  (Gastroenterology) MoAzucena FallenMD as Consulting Physician (Obstetrics and Gynecology) RaSuella BroadMD as Consulting Physician (Physical Medicine and Rehabilitation) AlGaynelle ArabianMD as Consulting Physician (Orthopedic Surgery) SoChesley MiresMD (Pulmonary Disease)    Assessment:    Physical assessment deferred to PCP.  Exercise Activities and Dietary recommendations   Diet (meal preparation, eat out, water intake, caffeinated beverages, dairy products, fruits and vegetables): {Desc; diets:16563} Breakfast: Lunch:  Dinner:      Goals    None     Fall Risk Fall Risk  09/09/2014  Falls in the past year?  No   Depression Screen PHQ 2/9 Scores 09/09/2014 07/13/2012  PHQ - 2 Score 0 2     Cognitive Function        Immunization History  Administered Date(s) Administered  . Influenza,inj,Quad PF,36+ Mos 07/25/2013, 09/09/2014, 08/22/2015, 09/27/2016  . Pneumococcal Polysaccharide-23 01/30/2009  . Td 03/17/2004  . Tdap 09/09/2014   Screening Tests Health Maintenance  Topic Date Due  . OPHTHALMOLOGY EXAM  11/28/1965  . PAP SMEAR  08/11/2013  . MAMMOGRAM  09/10/2016  . FOOT EXAM  03/23/2017  . URINE MICROALBUMIN  03/23/2017  . PNEUMOCOCCAL POLYSACCHARIDE VACCINE (2) 11/27/2017 (Originally 01/30/2014)  . HEMOGLOBIN A1C  03/28/2017  . INFLUENZA VACCINE  05/11/2017  . COLONOSCOPY  05/08/2024  . TETANUS/TDAP  09/09/2024  . Hepatitis C Screening  Completed  . HIV Screening  Completed      Plan:     I have personally reviewed and noted the following in the patient's chart:   . Medical and social history . Use of alcohol, tobacco or illicit drugs  . Current medications and supplements . Functional ability and status . Nutritional status . Physical activity . Advanced directives . List of other physicians . Vitals . Screenings to include cognitive, depression, and falls . Referrals and appointments  In addition, I have reviewed and discussed with patient  certain preventive protocols, quality metrics, and best practice recommendations. A written personalized care plan for preventive services as well as general preventive health recommendations were provided to patient.     Michiel Cowboy, RN  03/27/2017

## 2017-03-28 ENCOUNTER — Ambulatory Visit: Payer: Medicare Other

## 2017-03-28 ENCOUNTER — Other Ambulatory Visit (INDEPENDENT_AMBULATORY_CARE_PROVIDER_SITE_OTHER): Payer: Medicare Other

## 2017-03-28 ENCOUNTER — Ambulatory Visit (INDEPENDENT_AMBULATORY_CARE_PROVIDER_SITE_OTHER): Payer: Medicare Other | Admitting: Internal Medicine

## 2017-03-28 ENCOUNTER — Ambulatory Visit: Payer: Medicare Other | Admitting: Pulmonary Disease

## 2017-03-28 ENCOUNTER — Encounter: Payer: Self-pay | Admitting: Internal Medicine

## 2017-03-28 VITALS — BP 126/72 | HR 72 | Temp 99.0°F | Resp 16 | Wt 238.0 lb

## 2017-03-28 DIAGNOSIS — D329 Benign neoplasm of meninges, unspecified: Secondary | ICD-10-CM | POA: Insufficient documentation

## 2017-03-28 DIAGNOSIS — E119 Type 2 diabetes mellitus without complications: Secondary | ICD-10-CM | POA: Diagnosis not present

## 2017-03-28 DIAGNOSIS — E784 Other hyperlipidemia: Secondary | ICD-10-CM | POA: Diagnosis not present

## 2017-03-28 DIAGNOSIS — J069 Acute upper respiratory infection, unspecified: Secondary | ICD-10-CM | POA: Diagnosis not present

## 2017-03-28 DIAGNOSIS — I10 Essential (primary) hypertension: Secondary | ICD-10-CM

## 2017-03-28 DIAGNOSIS — K219 Gastro-esophageal reflux disease without esophagitis: Secondary | ICD-10-CM

## 2017-03-28 DIAGNOSIS — K5909 Other constipation: Secondary | ICD-10-CM | POA: Diagnosis not present

## 2017-03-28 DIAGNOSIS — E7849 Other hyperlipidemia: Secondary | ICD-10-CM

## 2017-03-28 LAB — HEMOGLOBIN A1C: Hgb A1c MFr Bld: 6.4 % (ref 4.6–6.5)

## 2017-03-28 LAB — COMPREHENSIVE METABOLIC PANEL
ALT: 11 U/L (ref 0–35)
AST: 17 U/L (ref 0–37)
Albumin: 4.2 g/dL (ref 3.5–5.2)
Alkaline Phosphatase: 137 U/L — ABNORMAL HIGH (ref 39–117)
BUN: 11 mg/dL (ref 6–23)
CO2: 31 mEq/L (ref 19–32)
Calcium: 9.6 mg/dL (ref 8.4–10.5)
Chloride: 102 mEq/L (ref 96–112)
Creatinine, Ser: 0.76 mg/dL (ref 0.40–1.20)
GFR: 98.74 mL/min (ref >60.00)
Glucose, Bld: 114 mg/dL — ABNORMAL HIGH (ref 70–99)
Potassium: 4 mEq/L (ref 3.5–5.1)
Sodium: 139 mEq/L (ref 135–145)
Total Bilirubin: 0.4 mg/dL (ref 0.2–1.2)
Total Protein: 7.6 g/dL (ref 6.0–8.3)

## 2017-03-28 LAB — MICROALBUMIN / CREATININE URINE RATIO
Creatinine,U: 367.1 mg/dL
Microalb Creat Ratio: 0.5 mg/g (ref 0.0–30.0)
Microalb, Ur: 1.7 mg/dL (ref 0.0–1.9)

## 2017-03-28 LAB — LIPID PANEL
Cholesterol: 149 mg/dL (ref 0–200)
HDL: 43 mg/dL (ref >39.00)
LDL Cholesterol: 89 mg/dL (ref 0–99)
NonHDL: 106.27
Total CHOL/HDL Ratio: 3
Triglycerides: 87 mg/dL (ref 0.0–149.0)
VLDL: 17.4 mg/dL (ref 0.0–40.0)

## 2017-03-28 MED ORDER — ALBUTEROL SULFATE HFA 108 (90 BASE) MCG/ACT IN AERS: 2.0000 | INHALATION_SPRAY | Freq: Four times a day (QID) | RESPIRATORY_TRACT | 8 refills | Status: DC | PRN

## 2017-03-28 MED ORDER — METFORMIN HCL 500 MG PO TABS: 500.0000 mg | ORAL_TABLET | Freq: Every day | ORAL | 1 refills | Status: DC

## 2017-03-28 MED ORDER — OMEPRAZOLE 20 MG PO CPDR: 20.0000 mg | DELAYED_RELEASE_CAPSULE | Freq: Every day | ORAL | 3 refills | Status: DC

## 2017-03-28 MED ORDER — HYDROCODONE-HOMATROPINE 5-1.5 MG/5ML PO SYRP: 5.0000 mL | ORAL_SOLUTION | Freq: Three times a day (TID) | ORAL | 0 refills | Status: DC | PRN

## 2017-03-28 NOTE — Assessment & Plan Note (Signed)
Trial of increasing linzess to 290 mcg daily - can increase permanently if effective

## 2017-03-28 NOTE — Assessment & Plan Note (Signed)
Check lipid panel  Continue daily statin Regular exercise and healthy diet encouraged  

## 2017-03-28 NOTE — Assessment & Plan Note (Signed)
BP well controlled Current regimen effective and well tolerated Continue current medications at current doses  

## 2017-03-28 NOTE — Patient Instructions (Addendum)
  Test(s) ordered today. Your results will be released to [place] (or called to you) after review, usually within 72hours after test completion. If any changes need to be made, you will be notified at that same time.  All other Health Maintenance issues reviewed.   All recommended immunizations and age-appropriate screenings are up-to-date or discussed.  No immunizations administered today.   Medications reviewed and updated.  No changes recommended at this time.  We will temporarily increase the Linzess to 290 mcg daily - we can permanently increase this if needed.   A prescription cough syrup was prescribed.   Your prescription(s) have been submitted to your pharmacy. Please take as directed and contact our office if you believe you are having problem(s) with the medication(s).   Please followup in 6 months

## 2017-03-28 NOTE — Assessment & Plan Note (Signed)
Likely viral Symptomatic treatment Hycodan cough syrup and symptomatic treatment

## 2017-03-28 NOTE — Assessment & Plan Note (Signed)
Check a1c Low sugar / carb diet Stressed regular exercise, weight loss  

## 2017-03-28 NOTE — Assessment & Plan Note (Signed)
GERD controlled Continue daily medication  

## 2017-04-05 ENCOUNTER — Telehealth: Payer: Self-pay | Admitting: Internal Medicine

## 2017-04-05 MED ORDER — BENZONATATE 200 MG PO CAPS: 200.0000 mg | ORAL_CAPSULE | Freq: Three times a day (TID) | ORAL | 0 refills | Status: DC | PRN

## 2017-04-05 NOTE — Telephone Encounter (Signed)
Please advise, not on med list.  

## 2017-04-05 NOTE — Telephone Encounter (Signed)
Pt said that she is needing a refill on her Tessalon cough pills   [facility]arm[facility] is [facility] [address]

## 2017-04-05 NOTE — Telephone Encounter (Signed)
sent 

## 2017-04-06 ENCOUNTER — Encounter: Payer: Self-pay | Admitting: Internal Medicine

## 2017-04-06 ENCOUNTER — Telehealth: Payer: Self-pay | Admitting: Adult Health

## 2017-04-06 ENCOUNTER — Encounter: Payer: Self-pay | Admitting: *Deleted

## 2017-04-06 DIAGNOSIS — G4733 Obstructive sleep apnea (adult) (pediatric): Secondary | ICD-10-CM

## 2017-04-06 NOTE — Telephone Encounter (Signed)
5.16.18 HST results received Per TP: mild OSA despite oral appliance.  Treatment options include - change to CPAP, continue oral appliance, or OV to discuss.  O2 desat occurred > CPAP would help with this.  Will not qualify for O2 d/t OSA diagnosis.  Called spoke with patient and discussed HST results/recs.  Pt stated she would like to change to CPAP. Per the 4.6.18 ov w/ TP:  Obstructive sleep apnea Moderate OSA - trouble tolerating CPAP in past. Oral appliance improves snoring but not apneic events  Will try to restart CPAP -auto set 5-20 , nasal pillows with chin strap.   Order for CPAP placed with the above settings and mask type Pt okay with this, nothing further needed Will sign off

## 2017-04-06 NOTE — Telephone Encounter (Signed)
error 

## 2017-04-12 ENCOUNTER — Encounter: Payer: Self-pay | Admitting: Pulmonary Disease

## 2017-04-22 ENCOUNTER — Other Ambulatory Visit: Payer: Self-pay | Admitting: *Deleted

## 2017-04-22 DIAGNOSIS — G4733 Obstructive sleep apnea (adult) (pediatric): Secondary | ICD-10-CM

## 2017-05-10 ENCOUNTER — Encounter: Payer: Self-pay | Admitting: Pulmonary Disease

## 2017-05-10 ENCOUNTER — Ambulatory Visit (INDEPENDENT_AMBULATORY_CARE_PROVIDER_SITE_OTHER): Payer: Medicare Other | Admitting: Pulmonary Disease

## 2017-05-10 DIAGNOSIS — G4733 Obstructive sleep apnea (adult) (pediatric): Secondary | ICD-10-CM

## 2017-05-10 NOTE — Progress Notes (Signed)
   Subjective:    Patient ID: Erica [name], female    DOB: [birth year], 61 y.o.   MRN: [id number]  HPI  61 year old never smoker for FU of obstructive sleep apnea.    Chief Complaint  Patient presents with  . Follow-up    Pt states that she has been having issues getting the correct mask size. States that the CPAP machine is helping her otherwise. DME: [facility]   [facility] [place] diagnosed in 2011 was poorly compliant, try CPAP again and to 2017 and could not tolerate, obtained oral appliance but follow-up home study showed that OSA was not completely treated. Hence was started back on CPAP in 04/2017 with nasal pillows. She is on auto settings and seems to be tolerating it quite well, no nasal congestion, mask and pressure issues  Medication review shows Xanax, Klonopin has been stopped, remains on muscle relaxant and tramadol for pain  She has lost 16 pounds to her current weight of 236 pounds  Significant tests/ events reviewed  11/2009  mild , AHI of 10/hr and desat as low as 83% - wt 240 lbs - BMI 39  PSG 09/2013 showed mild OSA- AHI 13/h    HST 01/2016 20/hr.     HST 03/2017 >> (with oral appliance ) >> mild OSa persists  CT head showed meningioma & has cluster headaches    Review of Systems Patient denies significant dyspnea,cough, hemoptysis,  chest pain, palpitations, pedal edema, orthopnea, paroxysmal nocturnal dyspnea, lightheadedness, nausea, vomiting, abdominal or  leg pains      Objective:   Physical Exam  Gen. Pleasant, obese, in no distress ENT - no lesions, no post nasal drip Neck: No JVD, no thyromegaly, no carotid bruits Lungs: no use of accessory muscles, no dullness to percussion, decreased without rales or rhonchi  Cardiovascular: Rhythm regular, heart sounds  normal, no murmurs or gallops, no peripheral edema Musculoskeletal: No deformities, no cyanosis or clubbing , no tremors        Assessment & Plan:

## 2017-05-10 NOTE — Addendum Note (Signed)
Addended by: Lorretta Harp on: 05/10/2017 01:25 PM   Modules accepted: Orders

## 2017-05-10 NOTE — Assessment & Plan Note (Signed)
Expectation is to use CPAP at least 4-6 hours every night Adjust temperature setting on humidifier She will get her straps change to nasal pillows  Weight loss encouraged, compliance with goal of at least 4-6 hrs every night is the expectation. Advised against medications with sedative side effects Cautioned against driving when sleepy - understanding that sleepiness will vary on a day to day basis

## 2017-05-10 NOTE — Patient Instructions (Signed)
  Expectation is to use CPAP at least 4-6 hours every night We will check download and give you feedback

## 2017-05-10 NOTE — Assessment & Plan Note (Signed)
Residual fatigue may be related to medications, once OSA is treated

## 2017-05-19 ENCOUNTER — Encounter: Payer: Self-pay | Admitting: Pulmonary Disease

## 2017-05-31 DIAGNOSIS — F331 Major depressive disorder, recurrent, moderate: Secondary | ICD-10-CM | POA: Diagnosis not present

## 2017-05-31 DIAGNOSIS — F410 Panic disorder [episodic paroxysmal anxiety] without agoraphobia: Secondary | ICD-10-CM | POA: Diagnosis not present

## 2017-05-31 DIAGNOSIS — F411 Generalized anxiety disorder: Secondary | ICD-10-CM | POA: Diagnosis not present

## 2017-06-02 ENCOUNTER — Telehealth: Payer: Self-pay | Admitting: Internal Medicine

## 2017-06-02 MED ORDER — SIMVASTATIN 40 MG PO TABS: 40.0000 mg | ORAL_TABLET | Freq: Every day | ORAL | 1 refills | Status: DC

## 2017-06-02 NOTE — Telephone Encounter (Signed)
Pt called for a refill of her simvastatin (ZOCOR) 40 MG tablet  Last OV 06/018 Please sent to [facility] Please advise

## 2017-06-02 NOTE — Telephone Encounter (Signed)
Reviewed chart pt is up-to-date sent refills to [facility]by[facility]  

## 2017-06-07 ENCOUNTER — Telehealth: Payer: Self-pay | Admitting: Adult Health

## 2017-06-07 NOTE — Telephone Encounter (Signed)
I called [facility] & spoke to [place].  He states order was received on 7/31 & they changed the cpap pressure thru [facility] & they called pt & left her a vm to make her aware.  I called the pt & explained this to her.  She is stating she needs some kind of clamps for the cpap.  I told her I would check on this for her.  I called Corene Cornea back & he is unaware of what kind of clamps pt would be talking about.  He said to have her call [facility] and ext [phone] for resp therapy dept.  They will be able to help her & if there is something she needs they will request order from [place].  I called the pt back & gave her this information.  Nothing further needed.

## 2017-06-10 ENCOUNTER — Ambulatory Visit (INDEPENDENT_AMBULATORY_CARE_PROVIDER_SITE_OTHER): Payer: Medicare Other | Admitting: Family Medicine

## 2017-06-10 ENCOUNTER — Encounter: Payer: Self-pay | Admitting: Family Medicine

## 2017-06-10 VITALS — BP 126/74 | HR 65 | Temp 98.3°F | Ht 67.0 in | Wt 237.0 lb

## 2017-06-10 DIAGNOSIS — M25552 Pain in left hip: Secondary | ICD-10-CM | POA: Diagnosis not present

## 2017-06-10 DIAGNOSIS — M545 Low back pain, unspecified: Secondary | ICD-10-CM

## 2017-06-10 MED ORDER — NAPROXEN 500 MG PO TABS: 500.0000 mg | ORAL_TABLET | Freq: Two times a day (BID) | ORAL | 0 refills | Status: AC

## 2017-06-10 NOTE — Patient Instructions (Addendum)
Thank you for coming in,   Please try the exercises and stretching as that's chronic keep you from having this problem recur. You can take the naproxen every day for 10 days straight and then as needed after that. If her symptoms do not improve then please follow-up in 3-4 weeks.   Please feel free to call with any questions or concerns at any time, at [phone]. --Dr. Raeford Razor

## 2017-06-10 NOTE — Progress Notes (Signed)
Erica [name] - 61 y.o. female MRN [id number]  Date of birth: 08/01/1956  SUBJECTIVE:  Including CC & ROS.  Chief Complaint  Patient presents with  . Hip Pain    started monday. patient states her sister with MS came to visit and believes she has a flare up of her arthiritis. states that she has a numbness going down her left arm this morning    Erica [name] is a 61 year old female that is presenting with left-sided back, hip, and radicular symptoms. She reports her symptoms started on Monday and have stayed the same. She is having left lower back pain that is localized. She is also having radicular symptoms on the posterior aspect of her left leg that terminates at her ankle. She has a history of radicular symptoms. She has had an epidural before roughly 2 years ago. She has not been taking any anti-inflammatories. She feels her symptoms are worse with movement. The symptoms are sharp in nature. She denies any weakness, urinary retention, or saddle anesthesia. She is ambulating with a single-point cane today. She feels like her symptoms are exacerbated after caring for her sister that has MS. She had to lift her on several occasions.   MRI lumbar spine showing shallow central disc protrusions at L4-5 and L5-S1 without significant neural compression and hypertrophic facet disease but no pars defects.I have independently reviewed her CT abdomen and pelvis from 05/07/16 which reveals some mild joint space narrowing of the left and right hip joint. I have reviewed her lab work from 03/28/17 which shows normal kidney function. Her A1c from 03/28/17 shows A1c of 6.4.  Review of Systems  Cardiovascular: Negative for leg swelling.  Musculoskeletal: Positive for back pain, gait problem and myalgias. Negative for joint swelling.  Skin: Negative for color change.  Neurological: Negative for weakness and numbness.  Hematological: Negative for adenopathy.   otherwise negative  HISTORY: Past Medical, Surgical,  Social, and Family History Reviewed & Updated per EMR.   Pertinent Historical Findings include:  Past Medical History:  Diagnosis Date  . Abnormal stress test    a. 09/2016: NST showed a small defect of mild severity present in the basal inferoseptal and mid inferoseptal location, consistent with ischemia. --> medically managed  . ALLERGIC RHINITIS   . DDD (degenerative disc disease), lumbar    ESI with Ramos (spring 2016)  . Depression   . Diabetes mellitus without complication ([place])   . Dyslipidemia   . External hemorrhoids   . GERD (gastroesophageal reflux disease)   . Hypertension   . Obesity   . Osteoarthritis   . Palpitations    a. prior event monitor showing sinus tachycardia, no PAF.   Marland Kitchen Panic attacks    Hx of depression  . Renal disorder   . Sleep apnea    CPAP hs    Past Surgical History:  Procedure Laterality Date  . ABDOMINAL HYSTERECTOMY    . MASS EXCISION Left 12/30/2015   Procedure: EXCISION LEFT LEG MASS;  Surgeon: Donnie Mesa, MD;  Location: [facility];  Service: General;  Laterality: Left;  . REPLACEMENT TOTAL KNEE Right   . RIGHT OOPHORECTOMY     No cancer    Allergies  Allergen Reactions  . Codeine Nausea And Vomiting  . Guaifenesin-Codeine Other (See Comments)    REACTION: feels spacey  . Wellbutrin [Bupropion] Palpitations    hallucinations    Family History  Problem Relation Age of Onset  . Heart disease Mother  Enlarged Heart, Pacemaker  . Diabetes Mother   . Thyroid disease Mother   . Dementia Father   . Kidney failure Father   . Prostate cancer Father   . Asthma Other   . Allergies Sister   . Asthma Son   . Breast cancer Maternal Aunt        cousin  . Colon cancer Cousin   . Heart disease Son        CHF, morbid obesity  . Prostate cancer Maternal Uncle   . Diabetes Son      Social History   Social History  . Marital status: Married    Spouse name: N/A  . Number of children: 3  . Years of  education: N/A   Occupational History  . Disabled    Social History Main Topics  . Smoking status: Never Smoker  . Smokeless tobacco: Never Used  . Alcohol use 0.6 oz/week    1 Glasses of wine per week     Comment: one drink a month  . Drug use: No  . Sexual activity: Not Currently   Other Topics Concern  . Not on file   Social History Narrative   Married with children. Pt is on disablity. Previous worked in the school system     PHYSICAL EXAM:  VS: BP 126/74 (BP Location: Right Arm, Patient Position: Sitting, Cuff Size: Large)   Pulse 65   Temp 98.3 F (36.8 C) (Oral)   Ht 5\' 7"  (1.702 m)   Wt 237 lb (107.5 kg)   SpO2 99%   BMI 37.12 kg/m  Physical Exam Gen: NAD, alert, cooperative with exam, well-appearing ENT: normal lips, normal nasal mucosa,  Eye: normal EOM, normal conjunctiva and lids CV:  no edema, +2 pedal pulses   Resp: no accessory muscle use, non-labored,  Skin: no rashes, no areas of induration  Neuro: normal tone, normal sensation to touch Psych:  normal insight, alert and oriented MSK:  Back: Tenderness to palpation over the lumbar spine, paraspinal muscles on the left, and greater trochanter. No tenderness to palpation over the SI joint or piriformis. Normal internal and external rotation of the hips. Normal strength with hip flexion. Some weakness with knee extension, with poor effort, to resistance on the left compared to the right. Negative straight leg raise bilaterally. Normal sensation. Normal patellar reflexes bilaterally. Neurovascularly intact      ASSESSMENT & PLAN:   Left hip pain Unclear if this is her back or her hip as the origin. She could have more than one thing presenting itself. Possible for Greater trochanteric bursitis vs glute med syn. CT of her hip doesn't suggest significant arthritis  - naproxen  - provided home exercises  - If no improvement consider injection, referral to PT. Consider further updated xray of hip     Acute left-sided low back pain without sciatica Has some underlying facet arthritis that could cause some muscle spasms in her back. Doesn't appear to be a nerve compression  - naproxen - exercises provided.  - consider PT if no improvement.

## 2017-06-12 DIAGNOSIS — M545 Low back pain, unspecified: Secondary | ICD-10-CM | POA: Insufficient documentation

## 2017-06-12 DIAGNOSIS — M25552 Pain in left hip: Secondary | ICD-10-CM | POA: Insufficient documentation

## 2017-06-12 NOTE — Assessment & Plan Note (Addendum)
Unclear if this is her back or her hip as the origin. She could have more than one thing presenting itself. Possible for Greater trochanteric bursitis vs glute med syn. CT of her hip doesn't suggest significant arthritis  - naproxen  - provided home exercises  - If no improvement consider injection, referral to PT. Consider further updated xray of hip

## 2017-06-12 NOTE — Assessment & Plan Note (Addendum)
Has some underlying facet arthritis that could cause some muscle spasms in her back. Doesn't appear to be a nerve compression  - naproxen - exercises provided.  - consider PT if no improvement.

## 2017-06-15 ENCOUNTER — Ambulatory Visit: Payer: Medicare Other | Admitting: Family Medicine

## 2017-07-04 ENCOUNTER — Ambulatory Visit: Payer: Medicare Other | Admitting: Adult Health

## 2017-07-05 ENCOUNTER — Ambulatory Visit (INDEPENDENT_AMBULATORY_CARE_PROVIDER_SITE_OTHER): Payer: Medicare Other | Admitting: Adult Health

## 2017-07-05 ENCOUNTER — Encounter: Payer: Self-pay | Admitting: Adult Health

## 2017-07-05 DIAGNOSIS — G4733 Obstructive sleep apnea (adult) (pediatric): Secondary | ICD-10-CM

## 2017-07-05 NOTE — Assessment & Plan Note (Signed)
Wt loss  

## 2017-07-05 NOTE — Assessment & Plan Note (Signed)
Improved control   Plan  Patient Instructions  Continue on CPAP At bedtime  .  Wear for at least 4hr each night .  Work on weight loss.  Saline nasal rinses and gel As needed   Do not drive if sleepy  Follow up with Dr. [name]  In 4-6 months and As needed

## 2017-07-05 NOTE — Progress Notes (Signed)
$'@Patient'F$  ID: Erica [name], female    DOB: 07-15-1956, 61 y.o.   MRN: [id number]  Chief Complaint  Patient presents with  . Follow-up    OSA    Referring provider: [name], MD  HPI: 61 year old never smoker for FU of obstructive sleep apnea.  [facility] [place] diagnosed in 2011 was poorly compliant, try CPAP again and to 2017 and could not tolerate, obtained oral appliance but follow-up home study showed that OSA was not completely treated. Hence was started back on CPAP in 04/2017 with nasal pillows. Medication review shows Xanax, Klonopin has been stopped, remains on muscle relaxant and tramadol for pain    Significant tests/ events reviewed  11/2009  mild , AHI of 10/hr and desat as low as 83% - wt 240 lbs - BMI 39  PSG 09/2013 showed mild OSA- AHI 13/h    HST 01/2016 20/hr.     HST 03/2017 >> (with oral appliance ) >> mild OSa persists  CT head showed meningioma & has cluster headaches  07/05/2017 Follow up : OSA  Patient presents for a two-month follow-up. Patient has known sleep apnea. She's recently been restarted back on C Pap. She is trying to get more comfortable with her C Pap. She does feel that is helping some. Download shows improved compliance with 83%. Average daily usage around 5 hours. She is on a set pressure of 10 cm H2O. AHI 3.3. Positive leaks. Missed couple of nights , mother in hospital out of town.     Allergies  Allergen Reactions  . Codeine Nausea And Vomiting  . Guaifenesin-Codeine Other (See Comments)    REACTION: feels spacey  . Wellbutrin [Bupropion] Palpitations    hallucinations    Immunization History  Administered Date(s) Administered  . Influenza,inj,Quad PF,6+ Mos 07/25/2013, 09/09/2014, 08/22/2015, 09/27/2016  . Pneumococcal Polysaccharide-23 01/30/2009  . Td 03/17/2004  . Tdap 09/09/2014    Past Medical History:  Diagnosis Date  . Abnormal stress test    a. 09/2016: NST showed a small defect of mild severity  present in the basal inferoseptal and mid inferoseptal location, consistent with ischemia. --> medically managed  . ALLERGIC RHINITIS   . DDD (degenerative disc disease), lumbar    ESI with Ramos (spring 2016)  . Depression   . Diabetes mellitus without complication ([place])   . Dyslipidemia   . External hemorrhoids   . GERD (gastroesophageal reflux disease)   . Hypertension   . Obesity   . Osteoarthritis   . Palpitations    a. prior event monitor showing sinus tachycardia, no PAF.   Marland Kitchen Panic attacks    Hx of depression  . Renal disorder   . Sleep apnea    CPAP hs    Tobacco History: History  Smoking Status  . Never Smoker  Smokeless Tobacco  . Never Used   Counseling given: Not Answered   Outpatient Encounter Prescriptions as of 07/05/2017  Medication Sig  . albuterol (PROVENTIL HFA;VENTOLIN HFA) 108 (90 Base) MCG/ACT inhaler Inhale 2 puffs into the lungs every 6 (six) hours as needed for wheezing or shortness of breath.  . ALPRAZolam (XANAX) 0.5 MG tablet Take 1 tablet (0.5 mg total) by mouth 2 (two) times daily as needed for anxiety.  Marland Kitchen atenolol (TENORMIN) 25 MG tablet Take '25mg'$  (1 tablet) in the AM and '50mg'$  (2 tablets) in the PM (Patient taking differently: Take 25 mg by mouth 2 (two) times daily. )  . benzonatate (TESSALON) 200 MG capsule Take 1  capsule (200 mg total) by mouth 3 (three) times daily as needed for cough.  . blood glucose meter kit and supplies KIT Dispense based on patient and insurance preference. Use up to four times daily as directed. (FOR ICD-9 250.00, 250.01).  . furosemide (LASIX) 40 MG tablet Take 1 tablet (40 mg total) by mouth 2 (two) times daily.  . hydrocortisone (ANUSOL-HC) 2.5 % rectal cream Place rectally as needed.  Marland Kitchen levocetirizine (XYZAL) 5 MG tablet Take 1 tablet (5 mg total) by mouth every evening.  . linaclotide (LINZESS) 145 MCG CAPS capsule Take 1 capsule (145 mcg total) by mouth daily.  . meclizine (ANTIVERT) 25 MG tablet Take 1 tablet  (25 mg total) by mouth 3 (three) times daily as needed for dizziness or nausea.  . metFORMIN (GLUCOPHAGE) 500 MG tablet Take 1 tablet (500 mg total) by mouth daily with breakfast.  . naproxen (NAPROSYN) 500 MG tablet Take 1 tablet (500 mg total) by mouth 2 (two) times daily with a meal.  . omeprazole (PRILOSEC) 20 MG capsule Take 1 capsule (20 mg total) by mouth daily.  . potassium chloride SA (K-DUR,KLOR-CON) 20 MEQ tablet Take 1 tablet (20 mEq total) by mouth 2 (two) times daily.  . simvastatin (ZOCOR) 40 MG tablet Take 1 tablet (40 mg total) by mouth daily at 6 PM.  . SUMAtriptan-naproxen (TREXIMET) 85-500 MG tablet Take 1 tablet by mouth every 2 (two) hours as needed for migraine.  . tizanidine (ZANAFLEX) 2 MG capsule Take 1 capsule (2 mg total) by mouth 3 (three) times daily as needed for muscle spasms.  . traMADol (ULTRAM) 50 MG tablet Take 1 tablet (50 mg total) by mouth every 8 (eight) hours as needed.  . venlafaxine XR (EFFEXOR-XR) 150 MG 24 hr capsule Take 1 capsule (150 mg total) by mouth daily with breakfast.  . HYDROcodone-homatropine (HYCODAN) 5-1.5 MG/5ML syrup Take 5 mLs by mouth every 8 (eight) hours as needed for cough. (Patient not taking: Reported on 07/05/2017)  . promethazine (PHENERGAN) 25 MG tablet Take 1 tablet (25 mg total) by mouth every 8 (eight) hours as needed for nausea or vomiting.   No facility-administered encounter medications on file as of 07/05/2017.      Review of Systems  Constitutional:   No  weight loss, night sweats,  Fevers, chills, fatigue, or  lassitude.  HEENT:   No headaches,  Difficulty swallowing,  Tooth/dental problems, or  Sore throat,                No sneezing, itching, ear ache, nasal congestion, post nasal drip,   CV:  No chest pain,  Orthopnea, PND, swelling in lower extremities, anasarca, dizziness, palpitations, syncope.   GI  No heartburn, indigestion, abdominal pain, nausea, vomiting, diarrhea, change in bowel habits, loss of  appetite, bloody stools.   Resp: No shortness of breath with exertion or at rest.  No excess mucus, no productive cough,  No non-productive cough,  No coughing up of blood.  No change in color of mucus.  No wheezing.  No chest wall deformity  Skin: no rash or lesions.  GU: no dysuria, change in color of urine, no urgency or frequency.  No flank pain, no hematuria   MS:  No joint pain or swelling.  No decreased range of motion.  No back pain.    Physical Exam  BP 124/76 (BP Location: Left Arm, Cuff Size: Normal)   Pulse 68   Ht '5\' 7"'$  (1.702 m)   Wt  247 lb 12.8 oz (112.4 kg)   SpO2 98%   BMI 38.81 kg/m   GEN: A/Ox3; pleasant , NAD, obese    HEENT:  [place]/AT,  EACs-clear, TMs-wnl, NOSE-clear, THROAT-clear, no lesions, no postnasal drip or exudate noted. Class 2-3 MP airway   NECK:  Supple w/ fair ROM; no JVD; normal carotid impulses w/o bruits; no thyromegaly or nodules palpated; no lymphadenopathy.    RESP  Clear  P & A; w/o, wheezes/ rales/ or rhonchi. no accessory muscle use, no dullness to percussion  CARD:  RRR, no m/r/g, no peripheral edema, pulses intact, no cyanosis or clubbing.  GI:   Soft & nt; nml bowel sounds; no organomegaly or masses detected.   Musco: Warm bil, no deformities or joint swelling noted.   Neuro: [name], no focal deficits noted.    Skin: Warm, no lesions or rashes    Lab Results:    BNP No results found for: BNP  Imaging: No results found.   Assessment & Plan:   Obstructive sleep apnea Improved control   Plan  Patient Instructions  Continue on CPAP At bedtime  .  Wear for at least 4hr each night .  Work on weight loss.  Saline nasal rinses and gel As needed   Do not drive if sleepy  Follow up with Dr. [name]  In 4-6 months and As needed       Morbid obesity Wt loss      Rexene Edison, NP 07/05/2017

## 2017-07-05 NOTE — Patient Instructions (Signed)
Continue on CPAP At bedtime  .  Wear for at least 4hr each night .  Work on weight loss.  Saline nasal rinses and gel As needed   Do not drive if sleepy  Follow up with Dr. [name]  In 4-6 months and As needed

## 2017-07-08 ENCOUNTER — Ambulatory Visit: Payer: Medicare Other | Admitting: Adult Health

## 2017-07-11 ENCOUNTER — Telehealth: Payer: Self-pay | Admitting: Internal Medicine

## 2017-07-11 DIAGNOSIS — R141 Gas pain: Secondary | ICD-10-CM

## 2017-07-11 DIAGNOSIS — R142 Eructation: Principal | ICD-10-CM

## 2017-07-11 DIAGNOSIS — R143 Flatulence: Principal | ICD-10-CM

## 2017-07-11 MED ORDER — LINACLOTIDE 145 MCG PO CAPS: 145.0000 ug | ORAL_CAPSULE | Freq: Every day | ORAL | 1 refills | Status: DC

## 2017-07-11 NOTE — Telephone Encounter (Signed)
Reviewed chart pt is up-to-date sent refills to mail service [place]...Johny Chess

## 2017-07-11 NOTE — Telephone Encounter (Signed)
Patient has called stating that she needs script for linzess to be sent to [facility]

## 2017-08-17 DIAGNOSIS — M5410 Radiculopathy, site unspecified: Secondary | ICD-10-CM | POA: Diagnosis not present

## 2017-09-05 NOTE — Telephone Encounter (Signed)
Created in Error

## 2017-09-10 DIAGNOSIS — M5416 Radiculopathy, lumbar region: Secondary | ICD-10-CM | POA: Diagnosis not present

## 2017-09-10 DIAGNOSIS — M5136 Other intervertebral disc degeneration, lumbar region: Secondary | ICD-10-CM | POA: Diagnosis not present

## 2017-09-20 ENCOUNTER — Other Ambulatory Visit: Payer: Self-pay | Admitting: Internal Medicine

## 2017-09-20 NOTE — Telephone Encounter (Signed)
Copied from [place] [phone]. Topic: Quick Communication - See Telephone Encounter >> Sep 20, 2017  8:59 AM Synthia Innocent wrote: CRM for notification. See Telephone encounter for:  Requesting refill on simvastatin (ZOCOR) 40 MG tablet, metFORMIN (GLUCOPHAGE) 500 MG, linaclotide (LINZESS) 145 MCG CAPS, atenolol (TENORMIN) 25 MG tablet, flonase, levocetirizine (XYZAL) 5 MG tablet, tramadol and furosemide. Requesting they be called in [place]. Requesting call back once sent.

## 2017-09-22 ENCOUNTER — Telehealth: Payer: Self-pay | Admitting: Internal Medicine

## 2017-09-22 ENCOUNTER — Other Ambulatory Visit: Payer: Self-pay | Admitting: *Deleted

## 2017-09-22 DIAGNOSIS — R142 Eructation: Secondary | ICD-10-CM

## 2017-09-22 DIAGNOSIS — R143 Flatulence: Secondary | ICD-10-CM

## 2017-09-22 DIAGNOSIS — R141 Gas pain: Secondary | ICD-10-CM

## 2017-09-22 DIAGNOSIS — J301 Allergic rhinitis due to pollen: Secondary | ICD-10-CM

## 2017-09-22 MED ORDER — METFORMIN HCL 500 MG PO TABS: 500.0000 mg | ORAL_TABLET | Freq: Every day | ORAL | 0 refills | Status: DC

## 2017-09-22 MED ORDER — LEVOCETIRIZINE DIHYDROCHLORIDE 5 MG PO TABS: 5.0000 mg | ORAL_TABLET | Freq: Every evening | ORAL | 0 refills | Status: DC

## 2017-09-22 MED ORDER — OMEPRAZOLE 20 MG PO CPDR: 20.0000 mg | DELAYED_RELEASE_CAPSULE | Freq: Every day | ORAL | 0 refills | Status: DC

## 2017-09-22 MED ORDER — FLUTICASONE PROPIONATE 50 MCG/ACT NA SUSP: 2.0000 | Freq: Every day | NASAL | 1 refills | Status: DC

## 2017-09-22 MED ORDER — SIMVASTATIN 40 MG PO TABS: 40.0000 mg | ORAL_TABLET | Freq: Every day | ORAL | 0 refills | Status: DC

## 2017-09-22 MED ORDER — OMEPRAZOLE 20 MG PO CPDR: 20.0000 mg | DELAYED_RELEASE_CAPSULE | Freq: Every day | ORAL | 1 refills | Status: DC

## 2017-09-22 MED ORDER — ATENOLOL 25 MG PO TABS: ORAL_TABLET | ORAL | 3 refills | Status: DC

## 2017-09-22 MED ORDER — LINACLOTIDE 145 MCG PO CAPS: 145.0000 ug | ORAL_CAPSULE | Freq: Every day | ORAL | 0 refills | Status: DC

## 2017-09-22 MED ORDER — FUROSEMIDE 40 MG PO TABS: 40.0000 mg | ORAL_TABLET | Freq: Two times a day (BID) | ORAL | 0 refills | Status: DC

## 2017-09-22 NOTE — Telephone Encounter (Signed)
Pt returned call regarding medication refills; [facility] CRM # [phone]; pt notified that per Tomma Lightning, Medical Assistant at [facility], flonase is not on the pt's med list and there is no recent refill date on tramadol; information relayed to pt and she states that Dr Quay Burow wrote the prescription for flonase; pt states that she also out of omeprazole and needs a refill; will re-route to [facility] pool for disposition of flonase and tramadol

## 2017-09-22 NOTE — Addendum Note (Signed)
Addended by: Earnstine Regal on: 09/22/2017 02:50 PM   Modules accepted: Orders

## 2017-09-22 NOTE — Telephone Encounter (Signed)
flonase pending - ok to fill.    Needs to be seen before tramadol will be refilled. ( has appt 12/16)

## 2017-09-22 NOTE — Telephone Encounter (Signed)
Flonase not on med list, and no recent refill date on Tramadol. Pls advise...Johny Chess

## 2017-09-22 NOTE — Telephone Encounter (Signed)
Attempted to contact pt regarding refill requests;  Left message at [phone]; will route request to [facility] pool for refills on tramadol and flonase; pt has upcoming appointment with Dr Quay Burow on September 25, 2017; see CRM # [phone].

## 2017-09-22 NOTE — Telephone Encounter (Signed)
MD ok rx for flonase and have been faxed to [facility] (see previous msg). Will send mail order rx for Omprazole as well...Erica [name]

## 2017-09-22 NOTE — Progress Notes (Signed)
See CRM # [phone]

## 2017-09-22 NOTE — Telephone Encounter (Signed)
Notified pt w/MD response. Verified which pharmacy she would like flonase sent. Sent rx to [facility]...Erica [name]

## 2017-09-23 DIAGNOSIS — F410 Panic disorder [episodic paroxysmal anxiety] without agoraphobia: Secondary | ICD-10-CM | POA: Diagnosis not present

## 2017-09-23 DIAGNOSIS — F411 Generalized anxiety disorder: Secondary | ICD-10-CM | POA: Diagnosis not present

## 2017-09-23 DIAGNOSIS — F331 Major depressive disorder, recurrent, moderate: Secondary | ICD-10-CM | POA: Diagnosis not present

## 2017-09-26 NOTE — Progress Notes (Signed)
Subjective:    Patient ID: Erica [name] Alert, female    DOB: [birth year], 61 y.o.   MRN: [id number]  HPI The patient is here for follow up.  For the past couple of days she has been experiencing sinus symptoms.  She states sinus congestion, ear pressure sinus pressure, sore throat and headaches.  She has been using her Flonase.  She has not taken any other medication.  She denies fevers, chills, cough, wheeze and shortness of breath.  Hypertension: She is taking her medication daily. She is compliant with a low sodium diet.  She denies chest pain, shortness of breath and lightheadedness. She is not exercising regularly.      Diabetes: She is taking her medication daily as prescribed. She is not compliant with a diabetic diet. She is not exercising regularly. She monitors her sugars and they have been running 118 - 140, reached 200's after steroid injection. She checks her feet daily and denies foot lesions. She is up-to-date with an ophthalmology examination.   Hyperlipidemia: She is taking her medication daily. She is compliant with a low fat/cholesterol diet. She is not exercising regularly. She denies myalgias.   GERD:  She is taking her medication daily as prescribed.  She denies any GERD symptoms and feels her GERD is well controlled.   Vertigo, nausea: She does experience intermittent vertigo and nausea.  Meclizine did not help that much, but the Phenergan seemed to help with the nausea.  She wondered if she could have that refilled her to use as needed.  She has gained weight since she was here last.  She is interested in a referral for the weight management clinic.  She is not currently exercising and is currently not eating as well as she should.  She thinks part of this is related to increased stress.  Medications and allergies reviewed with patient and updated if appropriate.  Patient Active Problem List   Diagnosis Date Noted  . Left hip pain 06/12/2017  . Acute left-sided  low back pain without sciatica 06/12/2017  . Meningioma ([place]) 03/28/2017  . Panic attacks 09/27/2016  . GERD (gastroesophageal reflux disease) 09/27/2016  . Grief reaction 08/23/2016  . Right ankle pain 08/19/2016  . Diabetes ([place]) 02/15/2016  . Skin nodule 11/28/2015  . Migraine without aura and with status migrainosus, not intractable 10/03/2015  . Numbness of left hand 10/03/2015  . Cephalalgia 10/03/2015  . Abnormal ECG 09/11/2012  . Palpitations 09/08/2012  . Sciatica of left side   . Peripheral edema 05/09/2012  . FATIGUE 02/23/2010  . Obstructive sleep apnea 10/31/2009  . Morbid obesity ([place]) 10/17/2009  . HEMORRHOIDS, EXTERNAL 10/17/2009  . CONSTIPATION, CHRONIC 10/17/2009  . URTICARIA 03/11/2009  . Hyperlipidemia 01/12/2008  . Depression, major, recurrent ([place]) 01/12/2008  . Essential hypertension, benign 01/12/2008  . Allergic rhinitis 01/12/2008    Current Outpatient Medications on File Prior to Visit  Medication Sig Dispense Refill  . ALPRAZolam (XANAX) 0.5 MG tablet Take 1 tablet (0.5 mg total) by mouth 2 (two) times daily as needed for anxiety. 60 tablet 1  . atenolol (TENORMIN) 25 MG tablet Take 76m (1 tablet) in the AM and 520m(2 tablets) in the PM 180 tablet 3  . blood glucose meter kit and supplies KIT Dispense based on patient and insurance preference. Use up to four times daily as directed. (FOR ICD-9 250.00, 250.01). 1 each 0  . furosemide (LASIX) 40 MG tablet Take 1 tablet (40 mg total) by mouth 2 (  two) times daily. 180 tablet 0  . levocetirizine (XYZAL) 5 MG tablet Take 1 tablet (5 mg total) by mouth every evening. 90 tablet 0  . meclizine (ANTIVERT) 25 MG tablet Take 1 tablet (25 mg total) by mouth 3 (three) times daily as needed for dizziness or nausea. 180 tablet 3  . metFORMIN (GLUCOPHAGE) 500 MG tablet Take 1 tablet (500 mg total) by mouth daily with breakfast. 90 tablet 0  . Multiple Vitamins-Minerals (MULTIVITAMIN WITH MINERALS) tablet Take 1 tablet  by mouth daily.    Marland Kitchen omeprazole (PRILOSEC) 20 MG capsule Take 1 capsule (20 mg total) by mouth daily. 90 capsule 1  . potassium chloride SA (K-DUR,KLOR-CON) 20 MEQ tablet Take 1 tablet (20 mEq total) by mouth 2 (two) times daily. 180 tablet 3  . simvastatin (ZOCOR) 40 MG tablet Take 1 tablet (40 mg total) by mouth daily at 6 PM. 90 tablet 0  . SUMAtriptan-naproxen (TREXIMET) 85-500 MG tablet Take 1 tablet by mouth every 2 (two) hours as needed for migraine. 10 tablet 8  . linaclotide (LINZESS) 145 MCG CAPS capsule Take 1 capsule (145 mcg total) by mouth daily. 90 capsule 0  . naproxen (NAPROSYN) 500 MG tablet Take 1 tablet (500 mg total) by mouth 2 (two) times daily with a meal. 60 tablet 0  . [DISCONTINUED] oxycodone (OXY-IR) 5 MG capsule Take 5 mg by mouth every 4 (four) hours as needed. For pain.     No current facility-administered medications on file prior to visit.     Past Medical History:  Diagnosis Date  . Abnormal stress test    a. 09/2016: NST showed a small defect of mild severity present in the basal inferoseptal and mid inferoseptal location, consistent with ischemia. --> medically managed  . ALLERGIC RHINITIS   . DDD (degenerative disc disease), lumbar    ESI with Ramos (spring 2016)  . Depression   . Diabetes mellitus without complication ([place])   . Dyslipidemia   . External hemorrhoids   . GERD (gastroesophageal reflux disease)   . Hypertension   . Obesity   . Osteoarthritis   . Palpitations    a. prior event monitor showing sinus tachycardia, no PAF.   Marland Kitchen Panic attacks    Hx of depression  . Renal disorder   . Sleep apnea    CPAP hs    Past Surgical History:  Procedure Laterality Date  . ABDOMINAL HYSTERECTOMY    . MASS EXCISION Left 12/30/2015   Procedure: EXCISION LEFT LEG MASS;  Surgeon: Donnie Mesa, MD;  Location: [facility];  Service: General;  Laterality: Left;  . REPLACEMENT TOTAL KNEE Right   . RIGHT OOPHORECTOMY     No cancer     Social History   Socioeconomic History  . Marital status: Married    Spouse name: None  . Number of children: 3  . Years of education: None  . Highest education level: None  Social Needs  . Financial resource strain: None  . Food insecurity - worry: None  . Food insecurity - inability: None  . Transportation needs - medical: None  . Transportation needs - non-medical: None  Occupational History  . Occupation: Disabled  Tobacco Use  . Smoking status: Never Smoker  . Smokeless tobacco: Never Used  Substance and Sexual Activity  . Alcohol use: Yes    Alcohol/week: 0.6 oz    Types: 1 Glasses of wine per week    Comment: one drink a month  . Drug  use: No  . Sexual activity: Not Currently  Other Topics Concern  . None  Social History Narrative   Married with children. Pt is on disablity. Previous worked in the school system    Family History  Problem Relation Age of Onset  . Heart disease Mother        Enlarged Heart, Pacemaker  . Diabetes Mother   . Thyroid disease Mother   . Dementia Father   . Kidney failure Father   . Prostate cancer Father   . Asthma Other   . Allergies Sister   . Asthma Son   . Breast cancer Maternal Aunt        cousin  . Colon cancer Cousin   . Heart disease Son        CHF, morbid obesity  . Prostate cancer Maternal Uncle   . Diabetes Son     Review of Systems  Constitutional: Negative for chills and fever.  HENT: Positive for congestion, ear pain, sinus pressure and sore throat.   Respiratory: Negative for cough, shortness of breath and wheezing.   Cardiovascular: Positive for palpitations (relateed to anxiety) and leg swelling (mild). Negative for chest pain.  Neurological: Positive for headaches (from sinuses and cpap). Negative for light-headedness.  Psychiatric/Behavioral: The patient is nervous/anxious (mom having medical problems).        Objective:   Vitals:   09/27/17 1022  BP: 134/76  Pulse: 64  Resp: 20  Temp:  98.3 F (36.8 C)  SpO2: 100%   Wt Readings from Last 3 Encounters:  09/27/17 249 lb (112.9 kg)  07/05/17 247 lb 12.8 oz (112.4 kg)  06/10/17 237 lb (107.5 kg)   Body mass index is 39 kg/m.   Physical Exam    Constitutional: Appears well-developed and well-nourished. No distress.  HENT:  Head: Normocephalic and atraumatic.  Neck: Neck supple. No tracheal deviation present. No thyromegaly present.  No cervical lymphadenopathy Cardiovascular: Normal rate, regular rhythm and normal heart sounds.   No murmur heard. No carotid bruit .  No edema Pulmonary/Chest: Effort normal and breath sounds normal. No respiratory distress. No has no wheezes. No rales.  Skin: Skin is warm and dry. Not diaphoretic.  Psychiatric: Normal mood and affect. Behavior is normal.      Assessment & Plan:    See Problem List for Assessment and Plan of chronic medical problems.

## 2017-09-26 NOTE — Progress Notes (Signed)
Subjective:   Erica [name] is a 61 y.o. female who presents for Medicare Annual (Subsequent) preventive examination.  Review of Systems:  No ROS.  Medicare Wellness Visit. Additional risk factors are reflected in the social history.  Cardiac Risk Factors include: advanced age (>49mn, >>56women);dyslipidemia;diabetes mellitus;hypertension;obesity (BMI >30kg/m2);sedentary lifestyle Sleep patterns: gets up 2 times nightly to void and sleeps 6-7 hours nightly.    Home Safety/Smoke Alarms: Feels safe in home. Smoke alarms in place.  Living environment; residence and Firearm Safety: 1-story house/ trailer, no firearms Lives withhusband, no needs for DME, limited support system Seat Belt Safety/Bike Helmet: Wears seat belt.     Objective:     Vitals: BP 134/76   Pulse 64   Temp 98.3 F (36.8 C)   Resp 20   Ht _0  (1.702 m)   Wt 249 lb (112.9 kg)   SpO2 100%   BMI 39.00 kg/m   Body mass index is 39 kg/m.  Advanced Directives 09/27/2017 12/08/2016 12/25/2015 04/24/2014  Does Patient Have a Medical Advance Directive? No Yes No Patient has advance directive, copy not in chart  Type of Advance Directive - Living will;[place]care [place] of [place]? - No - copy requested - -  Would patient like information on creating a medical advance directive? Yes (ED - Information included in AVS) No - Patient declined - -    Tobacco Social History   Tobacco Use  Smoking Status Never Smoker  Smokeless Tobacco Never Used     Counseling given: Not Answered   Past Medical History:  Diagnosis Date  . Abnormal stress test    a. 09/2016: NST showed a small defect of mild severity present in the basal inferoseptal and mid inferoseptal location, consistent with ischemia. --> medically managed  . ALLERGIC RHINITIS   . DDD (degenerative disc disease), lumbar    ESI with Ramos (spring 2016)  . Depression    . Diabetes mellitus without complication ([place]   . Dyslipidemia   . External hemorrhoids   . GERD (gastroesophageal reflux disease)   . Hypertension   . Obesity   . Osteoarthritis   . Palpitations    a. prior event monitor showing sinus tachycardia, no PAF.   .Marland KitchenPanic attacks    Hx of depression  . Renal disorder   . Sleep apnea    CPAP hs   Past Surgical History:  Procedure Laterality Date  . ABDOMINAL HYSTERECTOMY    . MASS EXCISION Left 12/30/2015   Procedure: EXCISION LEFT LEG MASS;  Surgeon: MDonnie Mesa MD;  [facility]  Service: General;  Laterality: Left;  . REPLACEMENT TOTAL KNEE Right   . RIGHT OOPHORECTOMY     No cancer   Family History  Problem Relation Age of Onset  . Heart disease Mother        Enlarged Heart, Pacemaker  . Diabetes Mother   . Thyroid disease Mother   . Dementia Father   . Kidney failure Father   . Prostate cancer Father   . Asthma Other   . Allergies Sister   . Asthma Son   . Breast cancer Maternal Aunt        cousin  . Colon cancer Cousin   . Heart disease Son        CHF, morbid obesity  . Prostate cancer Maternal Uncle   . Diabetes Son  Social History   Socioeconomic History  . Marital status: Married    Spouse name: None  . Number of children: 3  . Years of education: None  . Highest education level: None  Social Needs  . Financial resource strain: None  . Food insecurity - worry: None  . Food insecurity - inability: None  . Transportation needs - medical: None  . Transportation needs - non-medical: None  Occupational History  . Occupation: Disabled  Tobacco Use  . Smoking status: Never Smoker  . Smokeless tobacco: Never Used  Substance and Sexual Activity  . Alcohol use: Yes    Alcohol/week: 0.6 oz    Types: 1 Glasses of [place] per week    Comment: one drink a month  . Drug use: No  . Sexual activity: Not Currently  Other Topics Concern  . None  Social History Narrative   Married  with children. Pt is on disablity. Previous worked in the school system    Outpatient Encounter Medications as of 09/27/2017  Medication Sig  . albuterol (PROVENTIL HFA;VENTOLIN HFA) 108 (90 Base) MCG/ACT inhaler Inhale 2 puffs into the lungs every 6 (six) hours as needed for wheezing or shortness of breath.  . ALPRAZolam (XANAX) 0.5 MG tablet Take 1 tablet (0.5 mg total) by mouth 2 (two) times daily as needed for anxiety.  Marland Kitchen atenolol (TENORMIN) 25 MG tablet Take 13m (1 tablet) in the AM and 560m(2 tablets) in the PM  . blood glucose meter kit and supplies KIT Dispense based on patient and insurance preference. Use up to four times daily as directed. (FOR ICD-9 250.00, 250.01).  . fluticasone (FLONASE) 50 MCG/ACT nasal spray Place 2 sprays into both nostrils daily.  . furosemide (LASIX) 40 MG tablet Take 1 tablet (40 mg total) by mouth 2 (two) times daily.  . Marland Kitchenevocetirizine (XYZAL) 5 MG tablet Take 1 tablet (5 mg total) by mouth every evening.  . meclizine (ANTIVERT) 25 MG tablet Take 1 tablet (25 mg total) by mouth 3 (three) times daily as needed for dizziness or nausea.  . metFORMIN (GLUCOPHAGE) 500 MG tablet Take 1 tablet (500 mg total) by mouth daily with breakfast.  . Multiple Vitamins-Minerals (MULTIVITAMIN WITH MINERALS) tablet Take 1 tablet by mouth daily.  . Marland Kitchenmeprazole (PRILOSEC) 20 MG capsule Take 1 capsule (20 mg total) by mouth daily.  . potassium chloride SA (K-DUR,KLOR-CON) 20 MEQ tablet Take 1 tablet (20 mEq total) by mouth 2 (two) times daily.  . simvastatin (ZOCOR) 40 MG tablet Take 1 tablet (40 mg total) by mouth daily at 6 PM.  . SUMAtriptan-naproxen (TREXIMET) 85-500 MG tablet Take 1 tablet by mouth every 2 (two) hours as needed for migraine.  . traMADol (ULTRAM) 50 MG tablet Take 1 tablet (50 mg total) by mouth every 8 (eight) hours as needed.  . venlafaxine XR (EFFEXOR-XR) 150 MG 24 hr capsule Take 1 capsule (150 mg total) by mouth daily with breakfast.  . [DISCONTINUED]  albuterol (PROVENTIL HFA;VENTOLIN HFA) 108 (90 Base) MCG/ACT inhaler Inhale 2 puffs into the lungs every 6 (six) hours as needed for wheezing or shortness of breath.  . [DISCONTINUED] fluticasone (FLONASE) 50 MCG/ACT nasal spray Place 2 sprays into both nostrils daily.  . hydrocortisone (ANUSOL-HC) 2.5 % rectal cream Place rectally as needed.  . linaclotide (LINZESS) 145 MCG CAPS capsule Take 1 capsule (145 mcg total) by mouth daily.  . naproxen (NAPROSYN) 500 MG tablet Take 1 tablet (500 mg total) by mouth 2 (two) times daily  with a meal.  . promethazine (PHENERGAN) 25 MG tablet Take 1 tablet (25 mg total) by mouth every 8 (eight) hours as needed for up to 7 days for nausea or vomiting (related to dizziness).  . [DISCONTINUED] benzonatate (TESSALON) 200 MG capsule Take 1 capsule (200 mg total) by mouth 3 (three) times daily as needed for cough.  . [DISCONTINUED] hydrocortisone (ANUSOL-HC) 2.5 % rectal cream Place rectally as needed.  . [DISCONTINUED] promethazine (PHENERGAN) 25 MG tablet Take 1 tablet (25 mg total) by mouth every 8 (eight) hours as needed for nausea or vomiting.  . [DISCONTINUED] tizanidine (ZANAFLEX) 2 MG capsule Take 1 capsule (2 mg total) by mouth 3 (three) times daily as needed for muscle spasms.   No facility-administered encounter medications on file as of 09/27/2017.     Activities of Daily Living In your present state of health, do you have any difficulty performing the following activities: 09/27/2017  Hearing? N  Vision? N  Difficulty concentrating or making decisions? N  Walking or climbing stairs? Y  Dressing or bathing? N  Doing errands, shopping? N  Preparing Food and eating ? N  Using the Toilet? N  In the past six months, have you accidently leaked urine? N  Do you have problems with loss of bowel control? N  Managing your Medications? N  Managing your Finances? N  Housekeeping or managing your Housekeeping? Y  Some recent data might be hidden     Patient Care Team: [name], MD as PCP - General (Internal Medicine) Lafayette Dragon, MD (Inactive) as Consulting Physician (Gastroenterology) Azucena Fallen, MD as Consulting Physician (Obstetrics and Gynecology) Suella Broad, MD as Consulting Physician (Physical Medicine and Rehabilitation) Gaynelle Arabian, MD as Consulting Physician (Orthopedic Surgery) Chesley Mires, MD (Pulmonary Disease)    Assessment:   This is a routine wellness examination for [place]. Physical assessment deferred to PCP.   Exercise Activities and Dietary recommendations Current Exercise Habits: The patient does not participate in regular exercise at present(chair exercise pamphlets provided), Exercise limited by: orthopedic condition(s) Diet (meal preparation, eat out, water intake, caffeinated beverages, dairy products, fruits and vegetables): in general, an "unhealthy" diet   Reviewed heart healthy and diabetic diet, encouraged patient to increase daily water intake., Diet education was provided via handout. Relevant patient education assigned to patient using Emmi.  Goals    . Patient Stated     Lose weight, monitor diet for carbohydrates and exercise       Fall Risk Fall Risk  09/27/2017 09/09/2014  Falls in the past year? No No    Depression Screen PHQ 2/9 Scores 09/27/2017 09/09/2014 07/13/2012  PHQ - 2 Score 1 0 2  PHQ- 9 Score 2 - -     Cognitive Function MMSE - Mini Mental State Exam 09/27/2017  Orientation to time 5  Orientation to Place 5  Registration 3  Attention/ Calculation 3  Recall 3  Language- name 2 objects 2  Language- repeat 1  Language- follow 3 step command 3  Language- read & follow direction 1  Write a sentence 1  Copy design 1  Total score 28        Immunization History  Administered Date(s) Administered  . Influenza,inj,Quad PF,6+ Mos 07/25/2013, 09/09/2014, 08/22/2015, 09/27/2016, 09/27/2017  . Pneumococcal Polysaccharide-23 01/30/2009  . Td  03/17/2004  . Tdap 09/09/2014   Screening Tests Health Maintenance  Topic Date Due  . OPHTHALMOLOGY EXAM  11/28/1965  . PAP SMEAR  08/11/2013  . MAMMOGRAM  09/10/2016  . HEMOGLOBIN A1C  09/27/2017  . PNEUMOCOCCAL POLYSACCHARIDE VACCINE (2) 11/27/2017 (Originally 01/30/2014)  . INFLUENZA VACCINE  01/09/2018 (Originally 05/11/2017)  . FOOT EXAM  03/28/2018  . URINE MICROALBUMIN  03/28/2018  . COLONOSCOPY  05/08/2024  . TETANUS/TDAP  09/09/2024  . Hepatitis C Screening  Completed  . HIV Screening  Completed      Plan:    Continue doing brain stimulating activities (puzzles, reading, adult coloring books, staying active) to keep memory sharp.   Start to eat heart healthy diet (full of fruits, vegetables, whole grains, lean protein, water--limit salt, fat, and sugar intake) and increase physical activity as tolerated.  I have personally reviewed and noted the following in the patient's chart:   . Medical and social history . Use of alcohol, tobacco or illicit drugs  . Current medications and supplements . Functional ability and status . Nutritional status . Physical activity . Advanced directives . List of other physicians . Vitals . Screenings to include cognitive, depression, and falls . Referrals and appointments  In addition, I have reviewed and discussed with patient certain preventive protocols, quality metrics, and best practice recommendations. A written personalized care plan for preventive services as well as general preventive health recommendations were provided to patient.     Michiel Cowboy, RN  09/27/2017    Medical screening examination/treatment/procedure(s) were performed by non-physician practitioner and as supervising physician I was immediately available for consultation/collaboration. I agree with above. [name], MD

## 2017-09-26 NOTE — Patient Instructions (Addendum)
Test(s) ordered today. Your results will be released to [place] (or called to you) after review, usually within 72hours after test completion. If any changes need to be made, you will be notified at that same time.  Medications reviewed and updated.  No changes recommended at this time.   Please followup in 6 months  Please ask Dr. Katy Fitch to send result for eye exam to Dr. Quay Burow.  If you or someone you know has experienced the death of a loved one, the support of others can play an invaluable role in the healing process. [place] offers grief and loss services for anyone in the community. Contact us today [phone]  Continue doing brain stimulating activities (puzzles, reading, adult coloring books, staying active) to keep memory sharp.   Start to eat heart healthy diet (full of fruits, vegetables, whole grains, lean protein, water--limit salt, fat, and sugar intake) and increase physical activity as tolerated.   Ms. Plass , Thank you for taking time to come for your Medicare Wellness Visit. I appreciate your ongoing commitment to your health goals. Please review the following plan we discussed and let me know if I can assist you in the future.   These are the goals we discussed: Goals    . Patient Stated     Lose weight, monitor diet for carbohydrates and exercise       This is a list of the screening recommended for you and due dates:  Health Maintenance  Topic Date Due  . Eye exam for diabetics  11/28/1965  . Pap Smear  08/11/2013  . Mammogram  09/10/2016  . Hemoglobin A1C  09/27/2017  . Pneumococcal vaccine (2) 11/27/2017*  . Flu Shot  01/09/2018*  . Complete foot exam   03/28/2018  . Urine Protein Check  03/28/2018  . Colon Cancer Screening  05/08/2024  . Tetanus Vaccine  09/09/2024  .  Hepatitis C: One time screening is recommended by Center for Disease Control  (CDC) for  adults born from [birth year] through 1967.   Completed  . HIV Screening   Completed  *Topic was postponed. The date shown is not the original due date.

## 2017-09-27 ENCOUNTER — Encounter: Payer: Self-pay | Admitting: Internal Medicine

## 2017-09-27 ENCOUNTER — Ambulatory Visit (INDEPENDENT_AMBULATORY_CARE_PROVIDER_SITE_OTHER): Payer: Medicare Other | Admitting: Internal Medicine

## 2017-09-27 ENCOUNTER — Other Ambulatory Visit: Payer: Self-pay | Admitting: Emergency Medicine

## 2017-09-27 ENCOUNTER — Other Ambulatory Visit (INDEPENDENT_AMBULATORY_CARE_PROVIDER_SITE_OTHER): Payer: Medicare Other

## 2017-09-27 VITALS — BP 134/76 | HR 64 | Temp 98.3°F | Resp 20 | Ht 67.0 in | Wt 249.0 lb

## 2017-09-27 DIAGNOSIS — R143 Flatulence: Secondary | ICD-10-CM | POA: Diagnosis not present

## 2017-09-27 DIAGNOSIS — K219 Gastro-esophageal reflux disease without esophagitis: Secondary | ICD-10-CM | POA: Diagnosis not present

## 2017-09-27 DIAGNOSIS — R142 Eructation: Secondary | ICD-10-CM

## 2017-09-27 DIAGNOSIS — Z0000 Encounter for general adult medical examination without abnormal findings: Secondary | ICD-10-CM

## 2017-09-27 DIAGNOSIS — I10 Essential (primary) hypertension: Secondary | ICD-10-CM | POA: Diagnosis not present

## 2017-09-27 DIAGNOSIS — R110 Nausea: Secondary | ICD-10-CM

## 2017-09-27 DIAGNOSIS — E119 Type 2 diabetes mellitus without complications: Secondary | ICD-10-CM

## 2017-09-27 DIAGNOSIS — E7849 Other hyperlipidemia: Secondary | ICD-10-CM

## 2017-09-27 DIAGNOSIS — Z23 Encounter for immunization: Secondary | ICD-10-CM

## 2017-09-27 DIAGNOSIS — R141 Gas pain: Secondary | ICD-10-CM

## 2017-09-27 DIAGNOSIS — J069 Acute upper respiratory infection, unspecified: Secondary | ICD-10-CM | POA: Diagnosis not present

## 2017-09-27 DIAGNOSIS — M25552 Pain in left hip: Secondary | ICD-10-CM

## 2017-09-27 LAB — LIPID PANEL
Cholesterol: 165 mg/dL (ref 0–200)
HDL: 46.9 mg/dL (ref >39.00)
LDL Cholesterol: 92 mg/dL (ref 0–99)
NonHDL: 118.03
Total CHOL/HDL Ratio: 4
Triglycerides: 132 mg/dL (ref 0.0–149.0)
VLDL: 26.4 mg/dL (ref 0.0–40.0)

## 2017-09-27 LAB — HEMOGLOBIN A1C: Hgb A1c MFr Bld: 6.4 % (ref 4.6–6.5)

## 2017-09-27 LAB — COMPREHENSIVE METABOLIC PANEL
ALT: 7 U/L (ref 0–35)
AST: 11 U/L (ref 0–37)
Albumin: 3.8 g/dL (ref 3.5–5.2)
Alkaline Phosphatase: 119 U/L — ABNORMAL HIGH (ref 39–117)
BUN: 16 mg/dL (ref 6–23)
CO2: 30 mEq/L (ref 19–32)
Calcium: 9.2 mg/dL (ref 8.4–10.5)
Chloride: 106 mEq/L (ref 96–112)
Creatinine, Ser: 0.69 mg/dL (ref 0.40–1.20)
GFR: 110.22 mL/min (ref >60.00)
Glucose, Bld: 96 mg/dL (ref 70–99)
Potassium: 4.3 mEq/L (ref 3.5–5.1)
Sodium: 141 mEq/L (ref 135–145)
Total Bilirubin: 0.4 mg/dL (ref 0.2–1.2)
Total Protein: 7.2 g/dL (ref 6.0–8.3)

## 2017-09-27 MED ORDER — FLUTICASONE PROPIONATE 50 MCG/ACT NA SUSP: 2.0000 | Freq: Every day | NASAL | 11 refills | Status: DC

## 2017-09-27 MED ORDER — ALBUTEROL SULFATE HFA 108 (90 BASE) MCG/ACT IN AERS
2.0000 | INHALATION_SPRAY | Freq: Four times a day (QID) | RESPIRATORY_TRACT | 11 refills | Status: DC | PRN
Start: 2017-09-27 — End: 2018-08-08

## 2017-09-27 MED ORDER — HYDROCORTISONE 2.5 % RE CREA: TOPICAL_CREAM | RECTAL | 3 refills | Status: DC | PRN

## 2017-09-27 MED ORDER — PROMETHAZINE HCL 25 MG PO TABS: 25.0000 mg | ORAL_TABLET | Freq: Three times a day (TID) | ORAL | 2 refills | Status: DC | PRN

## 2017-09-27 NOTE — Assessment & Plan Note (Signed)
Check lipid panel  Continue daily statin Regular exercise and healthy diet encouraged  

## 2017-09-27 NOTE — Assessment & Plan Note (Signed)
Check A1c Stressed low sugar/carbohydrate diet and regular exercise Work on weight loss We will adjust medication if needed

## 2017-09-27 NOTE — Assessment & Plan Note (Signed)
She has regained weight and is currently not exercising or eating as healthy as she should Interested in referral to the weight management clinic-referred today

## 2017-09-27 NOTE — Assessment & Plan Note (Signed)
GERD controlled Continue daily medication  

## 2017-09-27 NOTE — Progress Notes (Signed)
Pt requested a grab bar for the bathroom, Please add DX in comments.

## 2017-09-27 NOTE — Assessment & Plan Note (Signed)
BP well controlled Current regimen effective and well tolerated Continue current medications at current doses cmp  

## 2017-10-12 DIAGNOSIS — G894 Chronic pain syndrome: Secondary | ICD-10-CM | POA: Diagnosis not present

## 2017-10-12 DIAGNOSIS — M5136 Other intervertebral disc degeneration, lumbar region: Secondary | ICD-10-CM | POA: Diagnosis not present

## 2017-10-12 DIAGNOSIS — M545 Low back pain: Secondary | ICD-10-CM | POA: Diagnosis not present

## 2017-10-12 DIAGNOSIS — Z79899 Other long term (current) drug therapy: Secondary | ICD-10-CM | POA: Diagnosis not present

## 2017-10-27 DIAGNOSIS — Z1151 Encounter for screening for human papillomavirus (HPV): Secondary | ICD-10-CM | POA: Diagnosis not present

## 2017-10-27 DIAGNOSIS — R1084 Generalized abdominal pain: Secondary | ICD-10-CM | POA: Diagnosis not present

## 2017-10-27 DIAGNOSIS — Z1231 Encounter for screening mammogram for malignant neoplasm of breast: Secondary | ICD-10-CM | POA: Diagnosis not present

## 2017-10-27 DIAGNOSIS — N760 Acute vaginitis: Secondary | ICD-10-CM | POA: Diagnosis not present

## 2017-10-27 DIAGNOSIS — N951 Menopausal and female climacteric states: Secondary | ICD-10-CM | POA: Diagnosis not present

## 2017-10-27 DIAGNOSIS — Z124 Encounter for screening for malignant neoplasm of cervix: Secondary | ICD-10-CM | POA: Diagnosis not present

## 2017-10-27 LAB — HM MAMMOGRAPHY

## 2017-11-15 DIAGNOSIS — H2513 Age-related nuclear cataract, bilateral: Secondary | ICD-10-CM | POA: Diagnosis not present

## 2017-11-15 DIAGNOSIS — E119 Type 2 diabetes mellitus without complications: Secondary | ICD-10-CM | POA: Diagnosis not present

## 2017-11-15 DIAGNOSIS — H40013 Open angle with borderline findings, low risk, bilateral: Secondary | ICD-10-CM | POA: Diagnosis not present

## 2017-11-15 LAB — HM DIABETES EYE EXAM

## 2017-11-17 ENCOUNTER — Encounter: Payer: Self-pay | Admitting: Internal Medicine

## 2017-12-07 ENCOUNTER — Ambulatory Visit (INDEPENDENT_AMBULATORY_CARE_PROVIDER_SITE_OTHER): Payer: Medicare Other | Admitting: Internal Medicine

## 2017-12-07 ENCOUNTER — Ambulatory Visit: Payer: Self-pay | Admitting: *Deleted

## 2017-12-07 ENCOUNTER — Encounter: Payer: Self-pay | Admitting: Internal Medicine

## 2017-12-07 VITALS — BP 114/80 | HR 78 | Temp 98.2°F | Resp 16 | Wt 247.0 lb

## 2017-12-07 DIAGNOSIS — R141 Gas pain: Secondary | ICD-10-CM

## 2017-12-07 DIAGNOSIS — B351 Tinea unguium: Secondary | ICD-10-CM | POA: Diagnosis not present

## 2017-12-07 DIAGNOSIS — R143 Flatulence: Secondary | ICD-10-CM | POA: Diagnosis not present

## 2017-12-07 DIAGNOSIS — R110 Nausea: Secondary | ICD-10-CM

## 2017-12-07 DIAGNOSIS — R142 Eructation: Secondary | ICD-10-CM | POA: Diagnosis not present

## 2017-12-07 MED ORDER — LINACLOTIDE 145 MCG PO CAPS: 145.0000 ug | ORAL_CAPSULE | Freq: Every day | ORAL | 1 refills | Status: DC

## 2017-12-07 MED ORDER — PROMETHAZINE HCL 25 MG PO TABS: 25.0000 mg | ORAL_TABLET | Freq: Three times a day (TID) | ORAL | 2 refills | Status: DC | PRN

## 2017-12-07 MED ORDER — CICLOPIROX 8 % EX SOLN: Freq: Every day | CUTANEOUS | 3 refills | Status: DC

## 2017-12-07 NOTE — Telephone Encounter (Signed)
Noted  

## 2017-12-07 NOTE — Telephone Encounter (Signed)
Patient had been using Vicks Vapor Rub on toe nail- L large toe. Patient is having some slight pain with it. But describes it as black with yellow on inside. She feels that the toenail is going to come off. Patient does not see a Podiatrist.   Reason for Disposition . Yellow pus seen in skin around toenail (cuticle area), or pus seen under toenail  Answer Assessment - Initial Assessment Questions 1. SYMPTOM: "What's the main symptom you're concerned about?" (e.g., rash, sore, callus, drainage, numbness)     Infection under toe nail 2. LOCATION: "Where is the  _______ located?" (e.g., foot/toe, top/bottom, left/right)     Left toenail- that toenail has come off before 3. ONSET: "When did the  ________  start?"     Over 1 month 4. PAIN: "Is there any pain?" If so, ask: "How bad is it?" (Scale: 1-10; mild, moderate, severe)     mild 5. CAUSE: "What do you think is causing the symptoms?"     Toenail infection- nail may be trying to come off 6. OTHER SYMPTOMS: "Do you have any other symptoms?" (e.g., fever, weakness)     Finger on R hand- middle- is dark 7. PREGNANCY: "Is there any chance you are pregnant?" "When was your last menstrual period?"     n/a  Protocols used: DIABETES - [place]

## 2017-12-07 NOTE — Assessment & Plan Note (Signed)
Has intermittent nausea without vomiting She denies any relation to specific foods Likely related to uncontrolled GERD Advised her to monitor if there is any relation to specific foods Can use promethazine as needed Working on weight loss

## 2017-12-07 NOTE — Patient Instructions (Signed)
  Medications reviewed and updated.  Changes include ciclopirox for your toe nails  Your prescription(s) have been submitted to your pharmacy. Please take as directed and contact our office if you believe you are having problem(s) with the medication(s).  A referral was ordered for podiatry

## 2017-12-07 NOTE — Assessment & Plan Note (Signed)
Related to constipation Controlled with linzess Refilled today

## 2017-12-07 NOTE — Progress Notes (Signed)
Subjective:    Patient ID: Erica [name] Alert, female    DOB: [birth year], 62 y.o.   MRN: [id number]  HPI The patient is here for an acute visit.  Constipation:  She is taking linzess and it works well. She feels her constipation is controlled. She would like a refill.    Nausea, GERD:  She is taking omeprazole daily and feels her gerd is controlled.  She does experience frequent nausea.  She denies relation to any specific food.  She has used promethazine and it has helped.  She ultimately knows she needs to lose weight.  She is currently on a wait list for the weight management clinic.  She is trying to be consistent with her diet.  She has intermittent pain in her right first toe nail.  It is lifting up from the skin and looks like it may fall off. It has fallen off in the past and grew back.  No discharge or pus.  Her first toenail on her left foot is also infected with a fungus in 1 of her fingernails as this as well.  She denies any pain with these nails.  Medications and allergies reviewed with patient and updated if appropriate.  Patient Active Problem List   Diagnosis Date Noted  . Left hip pain 06/12/2017  . Acute left-sided low back pain without sciatica 06/12/2017  . Meningioma ([place]) 03/28/2017  . Panic attacks 09/27/2016  . GERD (gastroesophageal reflux disease) 09/27/2016  . Grief reaction 08/23/2016  . Right ankle pain 08/19/2016  . Diabetes ([place]) 02/15/2016  . Skin nodule 11/28/2015  . Migraine without aura and with status migrainosus, not intractable 10/03/2015  . Numbness of left hand 10/03/2015  . Cephalalgia 10/03/2015  . Abnormal ECG 09/11/2012  . Palpitations 09/08/2012  . Sciatica of left side   . Peripheral edema 05/09/2012  . FATIGUE 02/23/2010  . Obstructive sleep apnea 10/31/2009  . Morbid obesity ([place]) 10/17/2009  . HEMORRHOIDS, EXTERNAL 10/17/2009  . CONSTIPATION, CHRONIC 10/17/2009  . URTICARIA 03/11/2009  . Hyperlipidemia 01/12/2008  .  Depression, major, recurrent ([place]) 01/12/2008  . Essential hypertension, benign 01/12/2008  . Allergic rhinitis 01/12/2008    Current Outpatient Medications on File Prior to Visit  Medication Sig Dispense Refill  . albuterol (PROVENTIL HFA;VENTOLIN HFA) 108 (90 Base) MCG/ACT inhaler Inhale 2 puffs into the lungs every 6 (six) hours as needed for wheezing or shortness of breath. 1 Inhaler 11  . ALPRAZolam (XANAX) 0.5 MG tablet Take 1 tablet (0.5 mg total) by mouth 2 (two) times daily as needed for anxiety. 60 tablet 1  . atenolol (TENORMIN) 25 MG tablet Take 58m (1 tablet) in the AM and 566m(2 tablets) in the PM 180 tablet 3  . blood glucose meter kit and supplies KIT Dispense based on patient and insurance preference. Use up to four times daily as directed. (FOR ICD-9 250.00, 250.01). 1 each 0  . fluticasone (FLONASE) 50 MCG/ACT nasal spray Place 2 sprays into both nostrils daily. 48 g 11  . furosemide (LASIX) 40 MG tablet Take 1 tablet (40 mg total) by mouth 2 (two) times daily. 180 tablet 0  . hydrocortisone (ANUSOL-HC) 2.5 % rectal cream Place rectally as needed. 30 g 3  . levocetirizine (XYZAL) 5 MG tablet Take 1 tablet (5 mg total) by mouth every evening. 90 tablet 0  . meclizine (ANTIVERT) 25 MG tablet Take 1 tablet (25 mg total) by mouth 3 (three) times daily as needed for dizziness or nausea.  180 tablet 3  . metFORMIN (GLUCOPHAGE) 500 MG tablet Take 1 tablet (500 mg total) by mouth daily with breakfast. 90 tablet 0  . Multiple Vitamins-Minerals (MULTIVITAMIN WITH MINERALS) tablet Take 1 tablet by mouth daily.    . naproxen (NAPROSYN) 500 MG tablet Take 1 tablet (500 mg total) by mouth 2 (two) times daily with a meal. 60 tablet 0  . omeprazole (PRILOSEC) 20 MG capsule Take 1 capsule (20 mg total) by mouth daily. 90 capsule 1  . potassium chloride SA (K-DUR,KLOR-CON) 20 MEQ tablet Take 1 tablet (20 mEq total) by mouth 2 (two) times daily. 180 tablet 3  . simvastatin (ZOCOR) 40 MG tablet  Take 1 tablet (40 mg total) by mouth daily at 6 PM. 90 tablet 0  . SUMAtriptan-naproxen (TREXIMET) 85-500 MG tablet Take 1 tablet by mouth every 2 (two) hours as needed for migraine. 10 tablet 8  . promethazine (PHENERGAN) 25 MG tablet Take 1 tablet (25 mg total) by mouth every 8 (eight) hours as needed for up to 7 days for nausea or vomiting (related to dizziness). 30 tablet 2  . [DISCONTINUED] oxycodone (OXY-IR) 5 MG capsule Take 5 mg by mouth every 4 (four) hours as needed. For pain.     No current facility-administered medications on file prior to visit.     Past Medical History:  Diagnosis Date  . Abnormal stress test    a. 09/2016: NST showed a small defect of mild severity present in the basal inferoseptal and mid inferoseptal location, consistent with ischemia. --> medically managed  . ALLERGIC RHINITIS   . DDD (degenerative disc disease), lumbar    ESI with Ramos (spring 2016)  . Depression   . Diabetes mellitus without complication ([place])   . Dyslipidemia   . External hemorrhoids   . GERD (gastroesophageal reflux disease)   . Hypertension   . Obesity   . Osteoarthritis   . Palpitations    a. prior event monitor showing sinus tachycardia, no PAF.   Marland Kitchen Panic attacks    Hx of depression  . Renal disorder   . Sleep apnea    CPAP hs    Past Surgical History:  Procedure Laterality Date  . ABDOMINAL HYSTERECTOMY    . MASS EXCISION Left 12/30/2015   Procedure: EXCISION LEFT LEG MASS;  Surgeon: Donnie Mesa, MD;  Location: [facility];  Service: General;  Laterality: Left;  . REPLACEMENT TOTAL KNEE Right   . RIGHT OOPHORECTOMY     No cancer    Social History   Socioeconomic History  . Marital status: Married    Spouse name: Not on file  . Number of children: 3  . Years of education: Not on file  . Highest education level: Not on file  Social Needs  . Financial resource strain: Not on file  . Food insecurity - worry: Not on file  . Food insecurity -  inability: Not on file  . Transportation needs - medical: Not on file  . Transportation needs - non-medical: Not on file  Occupational History  . Occupation: Disabled  Tobacco Use  . Smoking status: Never Smoker  . Smokeless tobacco: Never Used  Substance and Sexual Activity  . Alcohol use: Yes    Alcohol/week: 0.6 oz    Types: 1 Glasses of wine per week    Comment: one drink a month  . Drug use: No  . Sexual activity: Not Currently  Other Topics Concern  . Not on file  Social  History Narrative   Married with children. Pt is on disablity. Previous worked in the school system    Family History  Problem Relation Age of Onset  . Heart disease Mother        Enlarged Heart, Pacemaker  . Diabetes Mother   . Thyroid disease Mother   . Dementia Father   . Kidney failure Father   . Prostate cancer Father   . Asthma Other   . Allergies Sister   . Asthma Son   . Breast cancer Maternal Aunt        cousin  . Colon cancer Cousin   . Heart disease Son        CHF, morbid obesity  . Prostate cancer Maternal Uncle   . Diabetes Son     Review of Systems  Constitutional: Negative for chills and fever.  Gastrointestinal: Positive for constipation (Controlled) and nausea. Negative for abdominal pain and vomiting.       Denies GERD symptoms-controlled  Skin: Positive for color change (Nails only-right and left first toe and one fingernail). Negative for rash and wound.       Objective:   Vitals:   12/07/17 1443  BP: 114/80  Pulse: 78  Resp: 16  Temp: 98.2 F (36.8 C)  SpO2: 98%   Wt Readings from Last 3 Encounters:  12/07/17 247 lb (112 kg)  09/27/17 249 lb (112.9 kg)  07/05/17 247 lb 12.8 oz (112.4 kg)   Body mass index is 38.69 kg/m.   Physical Exam  Constitutional: She appears well-developed and well-nourished. No distress.  HENT:  Head: Normocephalic and atraumatic.  Musculoskeletal: She exhibits no edema.  Skin: Skin is warm and dry. She is not diaphoretic. No  erythema.  Right first toenail thickened and coming away from the skin, nail bed distorted and thickened, nail is dark in color.  Left first toenail discolored, but minimally thickened.  One fingernail also has some mild coloration.  No surrounding skin swelling, erythema or tenderness.  No discharge           Assessment & Plan:    See Problem List for Assessment and Plan of chronic medical problems.

## 2017-12-07 NOTE — Assessment & Plan Note (Signed)
Severe disease in right first toenail Toe nail needs to be removed - refer to podiatry Can try ciclopirox - doubt it will help much, but can try

## 2017-12-16 DIAGNOSIS — Z96651 Presence of right artificial knee joint: Secondary | ICD-10-CM | POA: Diagnosis not present

## 2017-12-16 DIAGNOSIS — M25552 Pain in left hip: Secondary | ICD-10-CM | POA: Diagnosis not present

## 2017-12-16 DIAGNOSIS — M25561 Pain in right knee: Secondary | ICD-10-CM | POA: Diagnosis not present

## 2017-12-16 DIAGNOSIS — Z471 Aftercare following joint replacement surgery: Secondary | ICD-10-CM | POA: Diagnosis not present

## 2017-12-20 DIAGNOSIS — F331 Major depressive disorder, recurrent, moderate: Secondary | ICD-10-CM | POA: Diagnosis not present

## 2017-12-20 DIAGNOSIS — F411 Generalized anxiety disorder: Secondary | ICD-10-CM | POA: Diagnosis not present

## 2017-12-20 DIAGNOSIS — F410 Panic disorder [episodic paroxysmal anxiety] without agoraphobia: Secondary | ICD-10-CM | POA: Diagnosis not present

## 2017-12-27 ENCOUNTER — Telehealth: Payer: Self-pay | Admitting: Pulmonary Disease

## 2017-12-27 ENCOUNTER — Encounter: Payer: Self-pay | Admitting: Pulmonary Disease

## 2017-12-27 DIAGNOSIS — G4733 Obstructive sleep apnea (adult) (pediatric): Secondary | ICD-10-CM

## 2017-12-27 NOTE — Telephone Encounter (Signed)
LMTCB

## 2017-12-28 NOTE — Telephone Encounter (Signed)
Spoke with pt, she thinks it is up too high. She states it is hard to use and feels the pressure should be turned down. I printed a download for RA to review.

## 2017-12-28 NOTE — Telephone Encounter (Signed)
Pressure is adequate. Change to auto 5-10 cm, that way it will start of flow and increased only if required Recheck download in 1 month and make sure she has follow-up with NP

## 2017-12-28 NOTE — Telephone Encounter (Signed)
Called and spoke to pt. Informed her of the recs per RA. Order placed and OV scheduled with SG in one month. Pt verbalized understanding and denied any further questions or concerns at this time.

## 2017-12-30 ENCOUNTER — Encounter: Payer: Self-pay | Admitting: Podiatry

## 2017-12-30 ENCOUNTER — Ambulatory Visit (INDEPENDENT_AMBULATORY_CARE_PROVIDER_SITE_OTHER): Payer: Medicare Other | Admitting: Podiatry

## 2017-12-30 VITALS — BP 118/72 | HR 63 | Resp 16

## 2017-12-30 DIAGNOSIS — E1149 Type 2 diabetes mellitus with other diabetic neurological complication: Secondary | ICD-10-CM

## 2017-12-30 DIAGNOSIS — B351 Tinea unguium: Secondary | ICD-10-CM

## 2017-12-30 DIAGNOSIS — E1140 Type 2 diabetes mellitus with diabetic neuropathy, unspecified: Secondary | ICD-10-CM | POA: Diagnosis not present

## 2017-12-30 DIAGNOSIS — M79675 Pain in left toe(s): Secondary | ICD-10-CM | POA: Diagnosis not present

## 2017-12-30 DIAGNOSIS — M79674 Pain in right toe(s): Secondary | ICD-10-CM

## 2017-12-30 NOTE — Patient Instructions (Signed)

## 2018-01-02 NOTE — Progress Notes (Signed)
Subjective:   Patient ID: Erica [name], female   DOB: 62 y.o.   MRN: [id number]   HPI Patient presents stating having a lot of nail disease of the nails and that they are getting increasingly sore and making it hard for her to cut.  Patient states that she is tried to do this herself and is becoming gradually worse and patient does not smoke currently and likes to be active   Review of Systems  All other systems reviewed and are negative.       Objective:  Physical Exam  Constitutional: She appears well-developed and well-nourished.  Cardiovascular: Intact distal pulses.  Pulmonary/Chest: Effort normal.  Musculoskeletal: Normal range of motion.  Neurological: She is [name].  Skin: Skin is warm.  Nursing note and vitals reviewed.   Neurovascular status intact with patient noted to have severe thickening of nailbeds 1-5 both feet that are incurvated and impossible for her to cut.  They do get sore and there is irritation especially around the hallux nails bilateral with no indications of proximal pathology     Assessment:  Chronic mycotic nail infection 1-5 both feet with thickness yellow brittle debris noted and pain     Plan:  H&P and conditions reviewed with patient.  Today I went ahead and I debrided nailbeds 1-5 both feet with no iatrogenic bleeding noted of the nailbeds

## 2018-01-04 ENCOUNTER — Other Ambulatory Visit: Payer: Self-pay | Admitting: Internal Medicine

## 2018-01-04 DIAGNOSIS — J301 Allergic rhinitis due to pollen: Secondary | ICD-10-CM

## 2018-01-04 MED ORDER — LEVOCETIRIZINE DIHYDROCHLORIDE 5 MG PO TABS: 5.0000 mg | ORAL_TABLET | Freq: Every evening | ORAL | 0 refills | Status: DC

## 2018-01-04 MED ORDER — METFORMIN HCL 500 MG PO TABS: 500.0000 mg | ORAL_TABLET | Freq: Every day | ORAL | 0 refills | Status: DC

## 2018-01-04 MED ORDER — SIMVASTATIN 40 MG PO TABS: 40.0000 mg | ORAL_TABLET | Freq: Every day | ORAL | 0 refills | Status: DC

## 2018-01-04 NOTE — Telephone Encounter (Signed)
Copied from [place] [id number]. Topic: Quick Communication - Rx Refill/Question >> Jan 04, 2018  3:31 PM Ether Griffins B wrote: Medication: Metformin         Simvastin        allergy med (starts with a L)   Has the patient contacted their pharmacy? Yes.   (Agent: If no, request that the patient contact the pharmacy for the refill.)  Preferred Pharmacy (with phone number or street name): [facility] - [place], [place] - [address] Agent: Please be advised that RX refills may take up to 3 business days. We ask that you follow-up with your pharmacy.

## 2018-01-19 ENCOUNTER — Encounter (INDEPENDENT_AMBULATORY_CARE_PROVIDER_SITE_OTHER): Payer: Medicare Other

## 2018-01-26 ENCOUNTER — Encounter (INDEPENDENT_AMBULATORY_CARE_PROVIDER_SITE_OTHER): Payer: Medicare Other

## 2018-01-30 ENCOUNTER — Ambulatory Visit (INDEPENDENT_AMBULATORY_CARE_PROVIDER_SITE_OTHER): Payer: Medicare Other | Admitting: Family Medicine

## 2018-02-01 ENCOUNTER — Ambulatory Visit (INDEPENDENT_AMBULATORY_CARE_PROVIDER_SITE_OTHER): Payer: Medicare Other | Admitting: Acute Care

## 2018-02-01 ENCOUNTER — Encounter: Payer: Self-pay | Admitting: Acute Care

## 2018-02-01 DIAGNOSIS — G4733 Obstructive sleep apnea (adult) (pediatric): Secondary | ICD-10-CM

## 2018-02-01 NOTE — Progress Notes (Signed)
History of Present Illness Erica [name] is a 62 y.o. female with OSA on CPAP therapy. She is followed by Dr. [name].   02/01/2018 One month follow up after change to Auto Set 5-10 cm on 12/27/2017. Pt. Presents today stating she has been doing well on her CPAP with the pressure adjustment to autoset. She states she is better with the switch to autoset. She has complaints of dry nasal passages while on CPAP. She is compliant 79% of the time. 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 She states he is doing well so she is unsure what is stressing her. She is compliant with her xanax.She is starting a weight loss program tomorrow. She is really bothered by her weight.She has some foot swelling today, but she has not taken her diuretic in the last 2 days.  Test Results: Down Load 01/04/2018-01/31/2018 Auto Set 5-10 cm H2O Usage 22/28 days( 79%) > 4 hours 46% < 4 hours 32% AHI is 1.8 Median pressure 6.4 cm H2O Max pressure 9.0 cm H2O  CBC Latest Ref Rng & Units 05/07/2016 03/23/2016 04/22/2014  WBC 4.0 - 10.5 K/uL 7.2 9.0 7.1  Hemoglobin 12.0 - 15.0 g/dL 12.4 12.9 11.8(L)  Hematocrit 36.0 - 46.0 % 37.3 39.2 36.0  Platelets 150.0 - 400.0 K/uL 249.0 271.0 223.0    BMP Latest Ref Rng & Units 09/27/2017 03/28/2017 09/27/2016  Glucose 70 - 99 mg/dL 96 114(H) 92  BUN 6 - 23 mg/dL '16 11 16  ' Creatinine 0.40 - 1.20 mg/dL 0.69 0.76 0.66  Sodium 135 - 145 mEq/L 141 139 142  Potassium 3.5 - 5.1 mEq/L 4.3 4.0 3.9  Chloride 96 - 112 mEq/L 106 102 106  CO2 19 - 32 mEq/L '30 31 28  ' Calcium 8.4 - 10.5 mg/dL 9.2 9.6 9.3    BNP No results found for: BNP  ProBNP    Component Value Date/Time   PROBNP 27.0 12/31/2014 1213    PFT No results found for:  FEV1PRE, FEV1POST, FVCPRE, FVCPOST, TLC, DLCOUNC, PREFEV1FVCRT, PSTFEV1FVCRT  No results found.   Past medical hx Past Medical History:  Diagnosis Date  . Abnormal stress test    a. 09/2016: NST showed a small defect of mild severity present in the basal inferoseptal and mid inferoseptal location, consistent with ischemia. --> medically managed  . ALLERGIC RHINITIS   . DDD (degenerative disc disease), lumbar    ESI with Ramos (spring 2016)  . Depression   . Diabetes mellitus without complication ([place])   . Dyslipidemia   . External hemorrhoids   . GERD (gastroesophageal reflux disease)   . Hypertension   . Obesity   . Osteoarthritis   . Palpitations    a. prior event monitor showing sinus tachycardia, no PAF.   Marland Kitchen Panic attacks    Hx of depression  . Renal disorder   . Sleep apnea    CPAP hs     Social History   Tobacco Use  . Smoking status: Never Smoker  . Smokeless tobacco: Never Used  Substance Use Topics  . Alcohol use: Yes    Alcohol/week: 0.6 oz    Types: 1 Glasses of wine per week    Comment: one drink a month  . Drug  use: No    Ms.Man reports that she has never smoked. She has never used smokeless tobacco. She reports that she drinks about 0.6 oz of alcohol per week. She reports that she does not use drugs.  Tobacco Cessation: Never smoker  Past surgical hx, Family hx, Social hx all reviewed.  Current Outpatient Medications on File Prior to Visit  Medication Sig  . albuterol (PROVENTIL HFA;VENTOLIN HFA) 108 (90 Base) MCG/ACT inhaler Inhale 2 puffs into the lungs every 6 (six) hours as needed for wheezing or shortness of breath.  . ALPRAZolam (XANAX) 0.5 MG tablet Take 1 tablet (0.5 mg total) by mouth 2 (two) times daily as needed for anxiety.  Marland Kitchen atenolol (TENORMIN) 25 MG tablet Take 34m (1 tablet) in the AM and 576m(2 tablets) in the PM  . baclofen (LIORESAL) 10 MG tablet baclofen 10 mg tablet  Take 1 tablet 3 times a day by oral route.  . blood  glucose meter kit and supplies KIT Dispense based on patient and insurance preference. Use up to four times daily as directed. (FOR ICD-9 250.00, 250.01).  . ciclopirox (PENLAC) 8 % solution Apply topically at bedtime. Apply over nail & surrounding skin. Apply QD over previous coat.After7 days, may remove w alcohol, repeat  . fluticasone (FLONASE) 50 MCG/ACT nasal spray Place 2 sprays into both nostrils daily.  . furosemide (LASIX) 40 MG tablet Take 1 tablet (40 mg total) by mouth 2 (two) times daily.  . hydrocortisone (ANUSOL-HC) 2.5 % rectal cream Place rectally as needed.  . Marland Kitchenevocetirizine (XYZAL) 5 MG tablet Take 1 tablet (5 mg total) by mouth every evening.  . linaclotide (LINZESS) 145 MCG CAPS capsule Take 1 capsule (145 mcg total) by mouth daily.  . meclizine (ANTIVERT) 25 MG tablet Take 1 tablet (25 mg total) by mouth 3 (three) times daily as needed for dizziness or nausea.  . metFORMIN (GLUCOPHAGE) 500 MG tablet Take 1 tablet (500 mg total) by mouth daily with breakfast.  . Multiple Vitamins-Minerals (MULTIVITAMIN WITH MINERALS) tablet Take 1 tablet by mouth daily.  . naproxen (NAPROSYN) 500 MG tablet Take 1 tablet (500 mg total) by mouth 2 (two) times daily with a meal.  . omeprazole (PRILOSEC) 20 MG capsule Take 1 capsule (20 mg total) by mouth daily.  . potassium chloride SA (K-DUR,KLOR-CON) 20 MEQ tablet Take 1 tablet (20 mEq total) by mouth 2 (two) times daily.  . simvastatin (ZOCOR) 40 MG tablet Take 1 tablet (40 mg total) by mouth daily at 6 PM.  . SUMAtriptan-naproxen (TREXIMET) 85-500 MG tablet Take 1 tablet by mouth every 2 (two) hours as needed for migraine.  . promethazine (PHENERGAN) 25 MG tablet Take 1 tablet (25 mg total) by mouth every 8 (eight) hours as needed for up to 7 days for nausea or vomiting (related to dizziness).  . [DISCONTINUED] oxycodone (OXY-IR) 5 MG capsule Take 5 mg by mouth every 4 (four) hours as needed. For pain.   No current facility-administered  medications on file prior to visit.      Allergies  Allergen Reactions  . Codeine Nausea And Vomiting  . Guaifenesin-Codeine Other (See Comments)    REACTION: feels spacey  . Wellbutrin [Bupropion] Palpitations    hallucinations    Review Of Systems:  Constitutional:   No  weight loss, night sweats,  Fevers, chills, fatigue, or  lassitude.  HEENT:   No headaches,  Difficulty swallowing,  Tooth/dental problems, or  Sore throat,  No sneezing, itching, ear ache, nasal congestion, post nasal drip,   CV:  No chest pain,  Orthopnea, PND, swelling in lower extremities, anasarca, dizziness, palpitations, syncope.   GI  No heartburn, indigestion, abdominal pain, nausea, vomiting, diarrhea, change in bowel habits, loss of appetite, bloody stools.   Resp: No shortness of breath with exertion or at rest.  No excess mucus, no productive cough,  No non-productive cough,  No coughing up of blood.  No change in color of mucus.  No wheezing.  No chest wall deformity, dry nasal passages  Skin: no rash or lesions.  GU: no dysuria, change in color of urine, no urgency or frequency.  No flank pain, no hematuria   MS:  No joint pain or swelling.  No decreased range of motion.  No back pain.  Psych:  No change in mood or affect. No depression or anxiety.  No memory loss.   Vital Signs BP 126/78 (BP Location: Left Arm, Cuff Size: Normal)   Pulse 82   Ht '5\' 7"'  (1.702 m)   Wt 247 lb 6.4 oz (112.2 kg)   SpO2 99%   BMI 38.75 kg/m    Physical Exam:  General- No distress,  A&Ox3, pleasant ENT: No sinus tenderness, TM clear, pale nasal mucosa, no oral exudate,no post nasal drip, no LAN Cardiac: S1, S2, regular rate and rhythm, no murmur Chest: No wheeze/ rales/ dullness; no accessory muscle use, no nasal flaring, no sternal retractions Abd.: Soft Non-tender, ND, Obese Ext: No clubbing cyanosis, edema Neuro:  normal strength Skin: No rashes, warm and dry Psych: normal mood and  behavior   Assessment/Plan  Obstructive sleep apnea Doing well with pressure adjustment to AutoSet 5-10 cm H2O Compliance is 79% Does not sleep well due to stress Plan: Continue on CPAP at bedtime. You appear to be benefiting from the treatment Goal is to wear for at least 6 hours each night for maximal clinical benefit. Continue to work on weight loss, as the link between excess weight  and sleep apnea is well established.  Do not drive if sleepy. Remember to clean mask, tubing, filter, and reservoir once weekly with soapy water.  Follow up with Dr. [name] or NP  In   6 months or before as needed.  Try nasal saline for sinus dryness. We will have your DME check the humidity on your machine. Remember to take your lasix daily to avoid foot swelling. Please contact office for sooner follow up if symptoms do not improve or worsen or seek emergency care      Magdalen Spatz, NP 02/01/2018  2:43 PM

## 2018-02-01 NOTE — Patient Instructions (Addendum)
It is good to see you today. Continue on CPAP at bedtime. You appear to be benefiting from the treatment Goal is to wear for at least 6 hours each night for maximal clinical benefit. Continue to work on weight loss, as the link between excess weight  and sleep apnea is well established.  Do not drive if sleepy. Remember to clean mask, tubing, filter, and reservoir once weekly with soapy water.  Follow up with Dr. [name] or NP  In   6 months or before as needed.  Try nasal saline for sinus dryness. We will have your DME check the humidity on your machine. Remember to take your lasix daily to avoid foot swelling. Please contact office for sooner follow up if symptoms do not improve or worsen or seek emergency care

## 2018-02-01 NOTE — Assessment & Plan Note (Signed)
Doing well with pressure adjustment to AutoSet 5-10 cm H2O Compliance is 79% Does not sleep well due to stress Plan: Continue on CPAP at bedtime. You appear to be benefiting from the treatment Goal is to wear for at least 6 hours each night for maximal clinical benefit. Continue to work on weight loss, as the link between excess weight  and sleep apnea is well established.  Do not drive if sleepy. Remember to clean mask, tubing, filter, and reservoir once weekly with soapy water.  Follow up with Dr. [name] or NP  In   6 months or before as needed.  Try nasal saline for sinus dryness. We will have your DME check the humidity on your machine. Remember to take your lasix daily to avoid foot swelling. Please contact office for sooner follow up if symptoms do not improve or worsen or seek emergency care

## 2018-02-02 ENCOUNTER — Ambulatory Visit (INDEPENDENT_AMBULATORY_CARE_PROVIDER_SITE_OTHER): Payer: Medicare Other | Admitting: Family Medicine

## 2018-02-02 ENCOUNTER — Encounter (INDEPENDENT_AMBULATORY_CARE_PROVIDER_SITE_OTHER): Payer: Medicare Other

## 2018-02-09 ENCOUNTER — Ambulatory Visit (INDEPENDENT_AMBULATORY_CARE_PROVIDER_SITE_OTHER): Payer: Medicare Other | Admitting: Family Medicine

## 2018-02-09 ENCOUNTER — Encounter (INDEPENDENT_AMBULATORY_CARE_PROVIDER_SITE_OTHER): Payer: Self-pay | Admitting: Family Medicine

## 2018-02-09 VITALS — BP 113/74 | HR 62 | Temp 98.1°F | Ht 66.0 in | Wt 241.0 lb

## 2018-02-09 DIAGNOSIS — E559 Vitamin D deficiency, unspecified: Secondary | ICD-10-CM | POA: Diagnosis not present

## 2018-02-09 DIAGNOSIS — Z6839 Body mass index (BMI) 39.0-39.9, adult: Secondary | ICD-10-CM | POA: Diagnosis not present

## 2018-02-09 DIAGNOSIS — Z0289 Encounter for other administrative examinations: Secondary | ICD-10-CM

## 2018-02-09 DIAGNOSIS — E119 Type 2 diabetes mellitus without complications: Secondary | ICD-10-CM

## 2018-02-09 DIAGNOSIS — R0600 Dyspnea, unspecified: Secondary | ICD-10-CM

## 2018-02-09 DIAGNOSIS — Z1331 Encounter for screening for depression: Secondary | ICD-10-CM

## 2018-02-09 DIAGNOSIS — E538 Deficiency of other specified B group vitamins: Secondary | ICD-10-CM

## 2018-02-09 DIAGNOSIS — R5383 Other fatigue: Secondary | ICD-10-CM

## 2018-02-09 DIAGNOSIS — R0609 Other forms of dyspnea: Secondary | ICD-10-CM

## 2018-02-09 DIAGNOSIS — E7849 Other hyperlipidemia: Secondary | ICD-10-CM | POA: Diagnosis not present

## 2018-02-09 DIAGNOSIS — E876 Hypokalemia: Secondary | ICD-10-CM | POA: Diagnosis not present

## 2018-02-10 LAB — COMPREHENSIVE METABOLIC PANEL
ALT: 11 IU/L (ref 0–32)
AST: 16 IU/L (ref 0–40)
Albumin/Globulin Ratio: 1.6 (ref 1.2–2.2)
Albumin: 4.2 g/dL (ref 3.6–4.8)
Alkaline Phosphatase: 154 IU/L — ABNORMAL HIGH (ref 39–117)
BUN/Creatinine Ratio: 23 (ref 12–28)
BUN: 17 mg/dL (ref 8–27)
Bilirubin Total: 0.2 mg/dL (ref 0.0–1.2)
CO2: 24 mmol/L (ref 20–29)
Calcium: 9.8 mg/dL (ref 8.7–10.3)
Chloride: 105 mmol/L (ref 96–106)
Creatinine, Ser: 0.75 mg/dL (ref 0.57–1.00)
GFR calc Af Amer: 97 mL/min/{1.73_m2} (ref >59)
GFR calc non Af Amer: 85 mL/min/{1.73_m2} (ref >59)
Globulin, Total: 2.7 g/dL (ref 1.5–4.5)
Glucose: 111 mg/dL — ABNORMAL HIGH (ref 65–99)
Potassium: 4.2 mmol/L (ref 3.5–5.2)
Sodium: 143 mmol/L (ref 134–144)
Total Protein: 6.9 g/dL (ref 6.0–8.5)

## 2018-02-10 LAB — CBC WITH DIFFERENTIAL
Basophils Absolute: 0 x10E3/uL (ref 0.0–0.2)
Basos: 0 %
EOS (ABSOLUTE): 0.1 x10E3/uL (ref 0.0–0.4)
Eos: 1 %
Hematocrit: 38.6 % (ref 34.0–46.6)
Hemoglobin: 12.4 g/dL (ref 11.1–15.9)
Immature Grans (Abs): 0 x10E3/uL (ref 0.0–0.1)
Immature Granulocytes: 0 %
Lymphocytes Absolute: 1.5 x10E3/uL (ref 0.7–3.1)
Lymphs: 23 %
MCH: 27.8 pg (ref 26.6–33.0)
MCHC: 32.1 g/dL (ref 31.5–35.7)
MCV: 87 fL (ref 79–97)
Monocytes Absolute: 0.4 x10E3/uL (ref 0.1–0.9)
Monocytes: 6 %
Neutrophils Absolute: 4.6 x10E3/uL (ref 1.4–7.0)
Neutrophils: 70 %
RBC: 4.46 x10E6/uL (ref 3.77–5.28)
RDW: 15.4 % (ref 12.3–15.4)
WBC: 6.6 x10E3/uL (ref 3.4–10.8)

## 2018-02-10 LAB — TSH: TSH: 2.02 u[IU]/mL (ref 0.450–4.500)

## 2018-02-10 LAB — LIPID PANEL WITH LDL/HDL RATIO
Cholesterol, Total: 183 mg/dL (ref 100–199)
HDL: 55 mg/dL (ref >39)
LDL Calculated: 108 mg/dL — ABNORMAL HIGH (ref 0–99)
LDl/HDL Ratio: 2 ratio (ref 0.0–3.2)
Triglycerides: 99 mg/dL (ref 0–149)
VLDL Cholesterol Cal: 20 mg/dL (ref 5–40)

## 2018-02-10 LAB — T4, FREE: Free T4: 1.06 ng/dL (ref 0.82–1.77)

## 2018-02-10 LAB — VITAMIN B12: Vitamin B-12: 943 pg/mL (ref 232–1245)

## 2018-02-10 LAB — VITAMIN D 25 HYDROXY (VIT D DEFICIENCY, FRACTURES): Vit D, 25-Hydroxy: 23.3 ng/mL — ABNORMAL LOW (ref 30.0–100.0)

## 2018-02-10 LAB — T3: T3, Total: 102 ng/dL (ref 71–180)

## 2018-02-10 LAB — HEMOGLOBIN A1C
Est. average glucose Bld gHb Est-mCnc: 131 mg/dL
Hgb A1c MFr Bld: 6.2 % — ABNORMAL HIGH (ref 4.8–5.6)

## 2018-02-10 LAB — INSULIN, RANDOM: INSULIN: 23.1 u[IU]/mL (ref 2.6–24.9)

## 2018-02-10 LAB — MICROALBUMIN, URINE: Microalbumin, Urine: 6.7 ug/mL

## 2018-02-13 ENCOUNTER — Other Ambulatory Visit: Payer: Self-pay | Admitting: Internal Medicine

## 2018-02-13 NOTE — Progress Notes (Signed)
Office: [phone]  /  Fax: [phone]   Dear Dr. Quay Burow,   Thank you for referring Erica [name] to our clinic. The following note includes my evaluation and treatment recommendations.  HPI:   Chief Complaint: OBESITY    Erica [name] has been referred by Erica Rail, MD for consultation regarding her obesity and obesity related comorbidities.    Erica [name] (MR# [id number]) is a 62 y.o. female who presents on 02/13/2018 for obesity evaluation and treatment. Current BMI is Body mass index is 38.9 kg/m.Marland Kitchen Erica [name] has been struggling with her weight for many years and has been unsuccessful in either losing weight, maintaining weight loss, or reaching her healthy weight goal.     Erica [name] attended our information session and states she is currently in the action stage of change and ready to dedicate time achieving and maintaining a healthier weight. Erica [name] is interested in becoming our patient and working on intensive lifestyle modifications including (but not limited to) diet, exercise and weight loss.    Erica [name] states her desired weight loss is 87 lbs she started gaining weight 6 yrs ago her heaviest weight ever was 150 lbs. she has significant food cravings issues  she snacks frequently in the evenings she skips meals frequently she is frequently drinking liquids with calories she frequently makes poor food choices she has binge eating behaviors she struggles with emotional eating    Fatigue Erica [name] feels her energy is lower than it should be. This has worsened with weight gain and has not worsened recently. Erica [name] admits to daytime somnolence and  admits to waking up still tired. Patient has a diagnosis of obstructive sleep apnea which may contribute to her fatigue. Patent has a history of symptoms of daytime fatigue, morning fatigue and morning headache. Patient generally gets 4 or 5 hours of sleep per night, and states they generally have restless  sleep. Snoring is present. Apneic episodes are present. Epworth Sleepiness Score is 14  Dyspnea on exertion Erica [name] notes increasing shortness of breath with exercising and seems to be worsening over time with weight gain. She notes getting out of breath sooner with activity than she used to. This has not gotten worse recently. [name] denies orthopnea.  Vitamin D deficiency Erica [name] has a diagnosis of vitamin D deficiency. She is currently taking multi vitamin and admits fatigue. [name] denies nausea, vomiting or muscle weakness.  Diabetes II [name] has a diagnosis of diabetes type II. She is currently on metformin and is doing well. Erica [name] does not have her blood sugar log today. She denies any hypoglycemic episodes. Last A1c was at 6.4 She would like to work on intensive lifestyle modifications including diet, exercise, and weight loss to attempt to improve her blood glucose levels.   Vitamin B12 deficiency Erica [name] is on metformin therapy, so Vit B12 level may be low. This is a new diagnosis. Erica [name] is not a vegetarian and does not have a previous diagnosis of pernicious anemia. She does not have a history of weight loss surgery.   Hypokalemia Erica [name] is on potassium 2 times daily, but she hasn't been taking recently.  Hyperlipidemia Erica [name] has hyperlipidemia and is on Zocor. Erica [name] is attempting to control her cholesterol levels with intensive lifestyle modification including a low saturated fat diet, exercise and weight loss. She denies any chest pain or myalgias.  Depression Screen Erica [name]'s Food and Mood (modified PHQ-9) score was  Depression screen PHQ 2/9 02/09/2018  Decreased Interest 1  Down, Depressed, Hopeless 2  PHQ - 2 Score 3  Altered sleeping 3  Tired, decreased energy 3  Change in appetite 1  Feeling bad or failure about yourself  3  Trouble concentrating 2  Moving slowly or fidgety/restless 0  Suicidal thoughts 1  PHQ-9 Score 16    Difficult doing work/chores Somewhat difficult    ALLERGIES: Allergies  Allergen Reactions  . Codeine Nausea And Vomiting  . Guaifenesin-Codeine Other (See Comments)    REACTION: feels spacey  . Wellbutrin [Bupropion] Palpitations    hallucinations    MEDICATIONS: Current Outpatient Medications on File Prior to Visit  Medication Sig Dispense Refill  . albuterol (PROVENTIL HFA;VENTOLIN HFA) 108 (90 Base) MCG/ACT inhaler Inhale 2 puffs into the lungs every 6 (six) hours as needed for wheezing or shortness of breath. 1 Inhaler 11  . ALPRAZolam (XANAX) 0.5 MG tablet Take 1 tablet (0.5 mg total) by mouth 2 (two) times daily as needed for anxiety. 60 tablet 1  . atenolol (TENORMIN) 25 MG tablet Take 60m (1 tablet) in the AM and 542m(2 tablets) in the PM 180 tablet 3  . baclofen (LIORESAL) 10 MG tablet baclofen 10 mg tablet  Take 1 tablet 3 times a day by oral route.    . blood glucose meter kit and supplies KIT Dispense based on patient and insurance preference. Use up to four times daily as directed. (FOR ICD-9 250.00, 250.01). 1 each 0  . ciclopirox (PENLAC) 8 % solution Apply topically at bedtime. Apply over nail & surrounding skin. Apply QD over previous coat.After7 days, may remove w alcohol, repeat 6.6 mL 3  . fluticasone (FLONASE) 50 MCG/ACT nasal spray Place 2 sprays into both nostrils daily. 48 g 11  . furosemide (LASIX) 40 MG tablet Take 1 tablet (40 mg total) by mouth 2 (two) times daily. 180 tablet 0  . hydrocortisone (ANUSOL-HC) 2.5 % rectal cream Place rectally as needed. 30 g 3  . levocetirizine (XYZAL) 5 MG tablet Take 1 tablet (5 mg total) by mouth every evening. 90 tablet 0  . linaclotide (LINZESS) 145 MCG CAPS capsule Take 1 capsule (145 mcg total) by mouth daily. 90 capsule 1  . meclizine (ANTIVERT) 25 MG tablet Take 1 tablet (25 mg total) by mouth 3 (three) times daily as needed for dizziness or nausea. 180 tablet 3  . metFORMIN (GLUCOPHAGE) 500 MG tablet Take 1 tablet  (500 mg total) by mouth daily with breakfast. 90 tablet 0  . Multiple Vitamins-Minerals (MULTIVITAMIN WITH MINERALS) tablet Take 1 tablet by mouth daily.    . naproxen (NAPROSYN) 500 MG tablet Take 1 tablet (500 mg total) by mouth 2 (two) times daily with a meal. 60 tablet 0  . omeprazole (PRILOSEC) 20 MG capsule Take 1 capsule (20 mg total) by mouth daily. 90 capsule 1  . potassium chloride SA (K-DUR,KLOR-CON) 20 MEQ tablet Take 1 tablet (20 mEq total) by mouth 2 (two) times daily. 180 tablet 3  . simvastatin (ZOCOR) 40 MG tablet Take 1 tablet (40 mg total) by mouth daily at 6 PM. 90 tablet 0  . SUMAtriptan-naproxen (TREXIMET) 85-500 MG tablet Take 1 tablet by mouth every 2 (two) hours as needed for migraine. 10 tablet 8  . promethazine (PHENERGAN) 25 MG tablet Take 1 tablet (25 mg total) by mouth every 8 (eight) hours as needed for up to 7 days for nausea or vomiting (related to dizziness). 30 tablet 2  . [DISCONTINUED] oxycodone (OXY-IR) 5 MG capsule Take 5 mg by mouth every 4 (four)  hours as needed. For pain.     No current facility-administered medications on file prior to visit.     PAST MEDICAL HISTORY: Past Medical History:  Diagnosis Date  . Abnormal stress test    a. 09/2016: NST showed a small defect of mild severity present in the basal inferoseptal and mid inferoseptal location, consistent with ischemia. --> medically managed  . ALLERGIC RHINITIS   . Anxiety   . Back pain   . Bursitis   . Constipation   . DDD (degenerative disc disease), lumbar    ESI with [facility] (spring 2016)  . Depression   . Diabetes mellitus without complication ([place])   . Dry mouth   . Dyslipidemia   . Easy bruising   . External hemorrhoids   . Fatigue   . GERD (gastroesophageal reflux disease)   . Hay fever   . Hypertension   . Knee pain   . Leg cramping   . Muscle stiffness   . Nervousness   . Obesity   . Osteoarthritis   . Palpitations    a. prior event monitor showing sinus tachycardia,  no PAF.   Marland Kitchen Panic attacks    Hx of depression  . Renal disorder   . Sinus pain   . Sleep apnea    CPAP hs  . Stress   . Trouble in sleeping     PAST SURGICAL HISTORY: Past Surgical History:  Procedure Laterality Date  . ABDOMINAL HYSTERECTOMY    . MASS EXCISION Left 12/30/2015   Procedure: EXCISION LEFT LEG MASS;  Surgeon: Donnie Mesa, MD;  Location: ;  Service: General;  Laterality: Left;  . REPLACEMENT TOTAL KNEE Right   . RIGHT OOPHORECTOMY     No cancer    SOCIAL HISTORY: Social History   Tobacco Use  . Smoking status: Never Smoker  . Smokeless tobacco: Never Used  Substance Use Topics  . Alcohol use: Yes    Alcohol/week: 0.6 oz    Types: 1 Glasses of wine per week    Comment: one drink a month  . Drug use: No    FAMILY HISTORY: Family History  Problem Relation Age of Onset  . Heart disease Mother        Enlarged Heart, Pacemaker  . Diabetes Mother   . Thyroid disease Mother   . Hyperlipidemia Mother   . Hypertension Mother   . Sleep apnea Mother   . Dementia Father   . Kidney failure Father   . Prostate cancer Father   . Alcoholism Father   . Asthma Other   . Allergies Sister   . Asthma Son   . Breast cancer Maternal Aunt        cousin  . Colon cancer Cousin   . Heart disease Son        CHF, morbid obesity  . Prostate cancer Maternal Uncle   . Diabetes Son     ROS: Review of Systems  Constitutional: Positive for malaise/fatigue.  HENT: Positive for sinus pain and tinnitus.        Hay Fever Dry Mouth  Eyes:       Vision Changes Wear Glasses  Respiratory: Positive for shortness of breath (on exertion).   Cardiovascular: Positive for palpitations. Negative for chest pain.       Leg Cramping  Gastrointestinal: Positive for constipation and heartburn. Negative for nausea and vomiting.  Musculoskeletal: Positive for back pain. Negative for myalgias.       Muscle Stiffness  Negative for muscle weakness  Skin:        Dryness  Endo/Heme/Allergies: Bruises/bleeds easily (bruising).  Psychiatric/Behavioral: Positive for depression. The patient is nervous/anxious (nervousness) and has insomnia.        Stress    PHYSICAL EXAM: Blood pressure 113/74, pulse 62, temperature 98.1 F (36.7 C), temperature source Oral, height _0  (1.676 m), weight 241 lb (109.3 kg), SpO2 99 %. Body mass index is 38.9 kg/m. Physical Exam  Constitutional: She is oriented to person, place, and time. She appears well-developed and well-nourished.  HENT:  Head: Normocephalic and atraumatic.  Nose: Nose normal.  Eyes: EOM are normal. No scleral icterus.  Neck: Normal range of motion. Neck supple. No thyromegaly present.  Cardiovascular: Normal rate and regular rhythm.  Pulmonary/Chest: Effort normal. No respiratory distress.  Abdominal: Soft. There is no tenderness.  + obesity  Musculoskeletal: Normal range of motion.  Range of Motion normal in all 4 extremities  Neurological: She is alert and oriented to person, place, and time. Coordination normal.  Skin: Skin is warm and dry.  Psychiatric: She has a normal mood and affect. Her behavior is normal.  Vitals reviewed.   RECENT LABS AND TESTS: BMET    Component Value Date/Time   NA 143 02/09/2018 1053   K 4.2 02/09/2018 1053   CL 105 02/09/2018 1053   CO2 24 02/09/2018 1053   GLUCOSE 111 (H) 02/09/2018 1053   GLUCOSE 96 09/27/2017 1204   GLUCOSE 89 09/20/2006 0915   BUN 17 02/09/2018 1053   CREATININE 0.75 02/09/2018 1053   CALCIUM 9.8 02/09/2018 1053   GFRNONAA 85 02/09/2018 1053   GFRAA 97 02/09/2018 1053   Lab Results  Component Value Date   HGBA1C 6.2 (H) 02/09/2018   Lab Results  Component Value Date   INSULIN 23.1 02/09/2018   CBC    Component Value Date/Time   WBC 6.6 02/09/2018 1053   WBC 7.2 05/07/2016 1229   RBC 4.46 02/09/2018 1053   RBC 4.45 05/07/2016 1229   HGB 12.4 02/09/2018 1053   HCT 38.6 02/09/2018 1053   PLT 249.0 05/07/2016 1229    MCV 87 02/09/2018 1053   MCH 27.8 02/09/2018 1053   MCHC 32.1 02/09/2018 1053   MCHC 33.1 05/07/2016 1229   RDW 15.4 02/09/2018 1053   LYMPHSABS 1.5 02/09/2018 1053   MONOABS 0.5 05/07/2016 1229   EOSABS 0.1 02/09/2018 1053   BASOSABS 0.0 02/09/2018 1053   Iron/TIBC/Ferritin/ %Sat No results found for: IRON, TIBC, FERRITIN, IRONPCTSAT Lipid Panel     Component Value Date/Time   CHOL 183 02/09/2018 1053   TRIG 99 02/09/2018 1053   TRIG 51 09/20/2006 0915   HDL 55 02/09/2018 1053   CHOLHDL 4 09/27/2017 1204   VLDL 26.4 09/27/2017 1204   LDLCALC 108 (H) 02/09/2018 1053   Hepatic Function Panel     Component Value Date/Time   PROT 6.9 02/09/2018 1053   ALBUMIN 4.2 02/09/2018 1053   AST 16 02/09/2018 1053   ALT 11 02/09/2018 1053   ALKPHOS 154 (H) 02/09/2018 1053   BILITOT 0.2 02/09/2018 1053   BILIDIR 0.1 04/22/2014 1254      Component Value Date/Time   TSH 2.020 02/09/2018 1053   TSH 2.39 09/27/2016 1608   TSH 1.74 03/23/2016 0953    ECG  shows NSR with a rate of 63 BPM INDIRECT CALORIMETER done today shows a VO2 of 156 and a REE of 1088.  Her calculated basal metabolic rate is 0093 thus  her basal metabolic rate is worse than expected.    ASSESSMENT AND PLAN: Other fatigue - Plan: EKG 12-Lead, CBC With Differential, TSH, T4, free, T3  Dyspnea on exertion  Type 2 diabetes mellitus without complication, without long-term current use of insulin (HCC) - Plan: Hemoglobin A1c, Insulin, random, Microalbumin, urine  Vitamin B 12 deficiency - Plan: Vitamin B12  Vitamin D deficiency - Plan: VITAMIN D 25 Hydroxy (Vit-D Deficiency, Fractures)  Hypokalemia - Plan: Comprehensive metabolic panel  Other hyperlipidemia - Plan: Lipid Panel With LDL/HDL Ratio  Depression screening  Class 2 severe obesity with serious comorbidity and body mass index (BMI) of 39.0 to 39.9 in adult, unspecified obesity type (HCC)  PLAN: Fatigue Carolynne was informed that her fatigue may  be related to obesity, depression or many other causes. Labs will be ordered, and in the meanwhile Shakiara has agreed to work on diet, exercise and weight loss to help with fatigue. Proper sleep hygiene was discussed including the need for 7-8 hours of quality sleep each night. A sleep study was not ordered based on symptoms and Epworth score.  Dyspnea on exertion Terril's shortness of breath appears to be obesity related and exercise induced. She has agreed to work on weight loss and gradually increase exercise to treat her exercise induced shortness of breath. If Zykira follows our instructions and loses weight without improvement of her shortness of breath, we will plan to refer to pulmonology. We will monitor this condition regularly. Channah agrees to this plan.  Vitamin D Deficiency Lisseth was informed that low vitamin D levels contributes to fatigue and are associated with obesity, breast, and colon cancer. She agrees to continue multi vitamin. We will check labs and she will follow up for routine testing of vitamin D, at least 2-3 times per year. She was informed of the risk of over-replacement of vitamin D and agrees to not increase her dose unless she discusses this with [place] first.  Diabetes II Kiyoko has been given extensive diabetes education by myself today including ideal fasting and post-prandial blood glucose readings, individual ideal Hgb A1c goals and hypoglycemia prevention. We discussed the importance of good blood sugar control to decrease the likelihood of diabetic complications such as nephropathy, neuropathy, limb loss, blindness, coronary artery disease, and death. We discussed the importance of intensive lifestyle modification including diet, exercise and weight loss as the first line treatment for diabetes. We will check labs and Nissi agrees to work on diet and exercise.  Maddilyn agrees to continue her diabetes medications and will follow up at the agreed  upon time.  Vitamin B12 deficiency Sharetta will work on increasing B12 rich foods in her diet. B12 supplementation was not prescribed today. We will check labs and Marche agreed to follow up at the agreed upon time.  Hypokalemia We will check labs and follow.  Hyperlipidemia Jodeen was informed of the American Heart Association Guidelines emphasizing intensive lifestyle modifications as the first line treatment for hyperlipidemia. We discussed many lifestyle modifications today in depth, and Editha will continue to work on decreasing saturated fats such as fatty red meat, butter and many fried foods. She will also increase vegetables and lean protein in her diet and continue to work on exercise and weight loss efforts. We will check labs and follow.  Depression Screen Ezri had a strongly positive depression screening. Depression is commonly associated with obesity and often results in emotional eating behaviors. We will monitor this closely and work on CBT to help improve the  non-hunger eating patterns. Referral to Psychology may be required if no improvement is seen as she continues in our clinic.  Obesity Maeby is currently in the action stage of change and her goal is to continue with weight loss efforts. I recommend Tiffay begin the structured treatment plan as follows:  She has agreed to follow the Category 1 plan Naveyah has been instructed to eventually work up to a goal of 150 minutes of combined cardio and strengthening exercise per week for weight loss and overall health benefits. We discussed the following Behavioral Modification Strategies today: increasing lean protein intake, decreasing simple carbohydrates  and work on meal planning and easy cooking plans   She was informed of the importance of frequent follow up visits to maximize her success with intensive lifestyle modifications for her multiple health conditions. She was informed we would discuss her  lab results at her next visit unless there is a critical issue that needs to be addressed sooner. Saamiya agreed to keep her next visit at the agreed upon time to discuss these results.    OBESITY BEHAVIORAL INTERVENTION VISIT  Today's visit was # 1 out of 22.  Starting weight: 241 lbs Starting date: 02/09/18 Today's weight : 241 lbs  Today's date: 02/09/2018 Total lbs lost to date: 0 (Patients must lose 7 lbs in the first 6 months to continue with counseling)   ASK: We discussed the diagnosis of obesity with [name] today and Santiago agreed to give [place] permission to discuss obesity behavioral modification therapy today.  ASSESS: Brissia has the diagnosis of obesity and her BMI today is 38.92 Jarae is in the action stage of change   ADVISE: Jordi was educated on the multiple health risks of obesity as well as the benefit of weight loss to improve her health. She was advised of the need for long term treatment and the importance of lifestyle modifications.  AGREE: Multiple dietary modification options and treatment options were discussed and  Lorey agreed to the above obesity treatment plan.   I, Doreene Nest, am acting as transcriptionist for  Dennard Nip, MD  I have reviewed the above documentation for accuracy and completeness, and I agree with the above. -Dennard Nip, MD

## 2018-02-13 NOTE — Telephone Encounter (Signed)
Copied from [place] [id number]. Topic: Quick Communication - Rx Refill/Question >> Feb 13, 2018  1:11 PM Robina Ade, Helene Kelp D wrote: Medication: potassium chloride SA (K-DUR,KLOR-CON) 20 MEQ tablet Has the patient contacted their pharmacy?Yes (Agent: If no, request that the patient contact the pharmacy for the refill.) Preferred Pharmacy (with phone number or street name): [facility] - [place], [place] - [address] Agent: Please be advised that RX refills may take up to 3 business days. We ask that you follow-up with your pharmacy.

## 2018-02-14 MED ORDER — POTASSIUM CHLORIDE CRYS ER 20 MEQ PO TBCR: 20.0000 meq | EXTENDED_RELEASE_TABLET | Freq: Two times a day (BID) | ORAL | 1 refills | Status: DC

## 2018-02-14 NOTE — Telephone Encounter (Signed)
Rx refill request: potassium chloride 20 meq            LOV: 12/07/17  PCP: Quay Burow  Pharmacy: verified

## 2018-02-23 ENCOUNTER — Ambulatory Visit (INDEPENDENT_AMBULATORY_CARE_PROVIDER_SITE_OTHER): Payer: Medicare Other | Admitting: Family Medicine

## 2018-02-23 VITALS — BP 111/75 | HR 77 | Temp 98.4°F | Ht 66.0 in | Wt 238.0 lb

## 2018-02-23 DIAGNOSIS — Z6838 Body mass index (BMI) 38.0-38.9, adult: Secondary | ICD-10-CM | POA: Diagnosis not present

## 2018-02-23 DIAGNOSIS — E559 Vitamin D deficiency, unspecified: Secondary | ICD-10-CM

## 2018-02-23 DIAGNOSIS — E119 Type 2 diabetes mellitus without complications: Secondary | ICD-10-CM

## 2018-02-23 MED ORDER — VITAMIN D (ERGOCALCIFEROL) 1.25 MG (50000 UNIT) PO CAPS: 50000.0000 [IU] | ORAL_CAPSULE | ORAL | 0 refills | Status: DC

## 2018-02-23 NOTE — Progress Notes (Signed)
Office: [phone]  /  Fax: [phone]   HPI:   Chief Complaint: OBESITY Erica [name] is here to discuss her progress with her obesity treatment plan. She is on the Category 1 plan and is following her eating plan approximately 95 % of the time. She states she is exercising 0 minutes 0 times per week. Erica [name] did well with weight loss on the category 1 plan. She missed mayonnaise and watermelon. Erica [name] struggled to eat all her food. Her weight is 238 lb (108 kg) today and has had a weight loss of 3 pounds over a period of 2 weeks since her last visit. She has lost 3 lbs since starting treatment with [place].  Vitamin D deficiency (new) Erica [name] has a new diagnosis of vitamin D deficiency. Erica [name] is not currently taking vit D and she admits fatigue but denies nausea, vomiting or muscle weakness.  Diabetes II Erica [name] has a diagnosis of diabetes type II and she is on metformin. Erica [name] states fasting BGs range between 110 and 158, mostly 130's and she denies any hypoglycemic episodes, nausea or vomiting. Last A1c was at 6.2 She has been working on intensive lifestyle modifications including diet, exercise, and weight loss to help control her blood glucose levels.  ALLERGIES: Allergies  Allergen Reactions  . Codeine Nausea And Vomiting  . Guaifenesin-Codeine Other (See Comments)    REACTION: feels spacey  . Wellbutrin [Bupropion] Palpitations    hallucinations    MEDICATIONS: Current Outpatient Medications on File Prior to Visit  Medication Sig Dispense Refill  . albuterol (PROVENTIL HFA;VENTOLIN HFA) 108 (90 Base) MCG/ACT inhaler Inhale 2 puffs into the lungs every 6 (six) hours as needed for wheezing or shortness of breath. 1 Inhaler 11  . ALPRAZolam (XANAX) 0.5 MG tablet Take 1 tablet (0.5 mg total) by mouth 2 (two) times daily as needed for anxiety. 60 tablet 1  . atenolol (TENORMIN) 25 MG tablet Take 24m (1 tablet) in the AM and 573m(2 tablets) in the PM 180 tablet 3    . baclofen (LIORESAL) 10 MG tablet baclofen 10 mg tablet  Take 1 tablet 3 times a day by oral route.    . blood glucose meter kit and supplies KIT Dispense based on patient and insurance preference. Use up to four times daily as directed. (FOR ICD-9 250.00, 250.01). 1 each 0  . ciclopirox (PENLAC) 8 % solution Apply topically at bedtime. Apply over nail & surrounding skin. Apply QD over previous coat.After7 days, may remove w alcohol, repeat 6.6 mL 3  . fluticasone (FLONASE) 50 MCG/ACT nasal spray Place 2 sprays into both nostrils daily. 48 g 11  . furosemide (LASIX) 40 MG tablet Take 1 tablet (40 mg total) by mouth 2 (two) times daily. 180 tablet 0  . hydrocortisone (ANUSOL-HC) 2.5 % rectal cream Place rectally as needed. 30 g 3  . levocetirizine (XYZAL) 5 MG tablet Take 1 tablet (5 mg total) by mouth every evening. 90 tablet 0  . linaclotide (LINZESS) 145 MCG CAPS capsule Take 1 capsule (145 mcg total) by mouth daily. 90 capsule 1  . meclizine (ANTIVERT) 25 MG tablet Take 1 tablet (25 mg total) by mouth 3 (three) times daily as needed for dizziness or nausea. 180 tablet 3  . metFORMIN (GLUCOPHAGE) 500 MG tablet Take 1 tablet (500 mg total) by mouth daily with breakfast. 90 tablet 0  . Multiple Vitamins-Minerals (MULTIVITAMIN WITH MINERALS) tablet Take 1 tablet by mouth daily.    . naproxen (NAPROSYN) 500 MG tablet Take  1 tablet (500 mg total) by mouth 2 (two) times daily with a meal. 60 tablet 0  . omeprazole (PRILOSEC) 20 MG capsule Take 1 capsule (20 mg total) by mouth daily. 90 capsule 1  . potassium chloride SA (K-DUR,KLOR-CON) 20 MEQ tablet Take 1 tablet (20 mEq total) by mouth 2 (two) times daily. 180 tablet 1  . simvastatin (ZOCOR) 40 MG tablet Take 1 tablet (40 mg total) by mouth daily at 6 PM. 90 tablet 0  . SUMAtriptan-naproxen (TREXIMET) 85-500 MG tablet Take 1 tablet by mouth every 2 (two) hours as needed for migraine. 10 tablet 8  . promethazine (PHENERGAN) 25 MG tablet Take 1  tablet (25 mg total) by mouth every 8 (eight) hours as needed for up to 7 days for nausea or vomiting (related to dizziness). 30 tablet 2  . [DISCONTINUED] oxycodone (OXY-IR) 5 MG capsule Take 5 mg by mouth every 4 (four) hours as needed. For pain.     No current facility-administered medications on file prior to visit.     PAST MEDICAL HISTORY: Past Medical History:  Diagnosis Date  . Abnormal stress test    a. 09/2016: NST showed a small defect of mild severity present in the basal inferoseptal and mid inferoseptal location, consistent with ischemia. --> medically managed  . ALLERGIC RHINITIS   . Anxiety   . Back pain   . Bursitis   . Constipation   . DDD (degenerative disc disease), lumbar    ESI with [facility] (spring 2016)  . Depression   . Diabetes mellitus without complication ([place])   . Dry mouth   . Dyslipidemia   . Easy bruising   . External hemorrhoids   . Fatigue   . GERD (gastroesophageal reflux disease)   . Hay fever   . Hypertension   . Knee pain   . Leg cramping   . Muscle stiffness   . Nervousness   . Obesity   . Osteoarthritis   . Palpitations    a. prior event monitor showing sinus tachycardia, no PAF.   Marland Kitchen Panic attacks    Hx of depression  . Renal disorder   . Sinus pain   . Sleep apnea    CPAP hs  . Stress   . Trouble in sleeping     PAST SURGICAL HISTORY: Past Surgical History:  Procedure Laterality Date  . ABDOMINAL HYSTERECTOMY    . MASS EXCISION Left 12/30/2015   Procedure: EXCISION LEFT LEG MASS;  Surgeon: Donnie Mesa, MD;  Location: [place];  Service: General;  Laterality: Left;  . REPLACEMENT TOTAL KNEE Right   . RIGHT OOPHORECTOMY     No cancer    SOCIAL HISTORY: Social History   Tobacco Use  . Smoking status: Never Smoker  . Smokeless tobacco: Never Used  Substance Use Topics  . Alcohol use: Yes    Alcohol/week: 0.6 oz    Types: 1 Glasses of wine per week    Comment: one drink a month  . Drug use: No     FAMILY HISTORY: Family History  Problem Relation Age of Onset  . Heart disease Mother        Enlarged Heart, Pacemaker  . Diabetes Mother   . Thyroid disease Mother   . Hyperlipidemia Mother   . Hypertension Mother   . Sleep apnea Mother   . Dementia Father   . Kidney failure Father   . Prostate cancer Father   . Alcoholism Father   . Asthma  Other   . Allergies Sister   . Asthma Son   . Breast cancer Maternal Aunt        cousin  . Colon cancer Cousin   . Heart disease Son        CHF, morbid obesity  . Prostate cancer Maternal Uncle   . Diabetes Son     ROS: Review of Systems  Constitutional: Positive for malaise/fatigue and weight loss.  Gastrointestinal: Negative for nausea and vomiting.  Musculoskeletal:       Negative for muscle weakness  Endo/Heme/Allergies:       Negative for hypoglycemia    PHYSICAL EXAM: Blood pressure 111/75, pulse 77, temperature 98.4 F (36.9 C), temperature source Oral, height _0  (1.676 m), weight 238 lb (108 kg), SpO2 98 %. Body mass index is 38.41 kg/m. Physical Exam  Constitutional: She is oriented to person, place, and time. She appears well-developed and well-nourished.  Cardiovascular: Normal rate.  Pulmonary/Chest: Effort normal.  Musculoskeletal: Normal range of motion.  Neurological: She is oriented to person, place, and time.  Skin: Skin is warm and dry.  Psychiatric: She has a normal mood and affect. Her behavior is normal.  Vitals reviewed.   RECENT LABS AND TESTS: BMET    Component Value Date/Time   NA 143 02/09/2018 1053   K 4.2 02/09/2018 1053   CL 105 02/09/2018 1053   CO2 24 02/09/2018 1053   GLUCOSE 111 (H) 02/09/2018 1053   GLUCOSE 96 09/27/2017 1204   GLUCOSE 89 09/20/2006 0915   BUN 17 02/09/2018 1053   CREATININE 0.75 02/09/2018 1053   CALCIUM 9.8 02/09/2018 1053   GFRNONAA 85 02/09/2018 1053   GFRAA 97 02/09/2018 1053   Lab Results  Component Value Date   HGBA1C 6.2 (H) 02/09/2018    HGBA1C 6.4 09/27/2017   HGBA1C 6.4 03/28/2017   HGBA1C 6.2 09/27/2016   HGBA1C 6.3 03/23/2016   Lab Results  Component Value Date   INSULIN 23.1 02/09/2018   CBC    Component Value Date/Time   WBC 6.6 02/09/2018 1053   WBC 7.2 05/07/2016 1229   RBC 4.46 02/09/2018 1053   RBC 4.45 05/07/2016 1229   HGB 12.4 02/09/2018 1053   HCT 38.6 02/09/2018 1053   PLT 249.0 05/07/2016 1229   MCV 87 02/09/2018 1053   MCH 27.8 02/09/2018 1053   MCHC 32.1 02/09/2018 1053   MCHC 33.1 05/07/2016 1229   RDW 15.4 02/09/2018 1053   LYMPHSABS 1.5 02/09/2018 1053   MONOABS 0.5 05/07/2016 1229   EOSABS 0.1 02/09/2018 1053   BASOSABS 0.0 02/09/2018 1053   Iron/TIBC/Ferritin/ %Sat No results found for: IRON, TIBC, FERRITIN, IRONPCTSAT Lipid Panel     Component Value Date/Time   CHOL 183 02/09/2018 1053   TRIG 99 02/09/2018 1053   TRIG 51 09/20/2006 0915   HDL 55 02/09/2018 1053   CHOLHDL 4 09/27/2017 1204   VLDL 26.4 09/27/2017 1204   LDLCALC 108 (H) 02/09/2018 1053   Hepatic Function Panel     Component Value Date/Time   PROT 6.9 02/09/2018 1053   ALBUMIN 4.2 02/09/2018 1053   AST 16 02/09/2018 1053   ALT 11 02/09/2018 1053   ALKPHOS 154 (H) 02/09/2018 1053   BILITOT 0.2 02/09/2018 1053   BILIDIR 0.1 04/22/2014 1254      Component Value Date/Time   TSH 2.020 02/09/2018 1053   TSH 2.39 09/27/2016 1608   TSH 1.74 03/23/2016 0953   Results for DERINDA, BARTUS (MRN [id number]) as of  02/23/2018 17:05  Ref. Range 02/09/2018 10:53  Vitamin D, 25-Hydroxy Latest Ref Range: 30.0 - 100.0 ng/mL 23.3 (L)   ASSESSMENT AND PLAN: Vitamin D deficiency - Plan: Vitamin D, Ergocalciferol, (DRISDOL) 50000 units CAPS capsule  Type 2 diabetes mellitus without complication, without long-term current use of insulin (HCC)  Class 2 severe obesity with serious comorbidity and body mass index (BMI) of 38.0 to 38.9 in adult, unspecified obesity type ([place])  PLAN:  Vitamin D Deficiency (new) Anira  was informed that low vitamin D levels contributes to fatigue and are associated with obesity, breast, and colon cancer. She agrees to start to take prescription Vit D _0 ,000 IU every week #4 with no refills. We will recheck labs in 3 months and she will follow up for routine testing of vitamin D, at least 2-3 times per year. She was informed of the risk of over-replacement of vitamin D and agrees to not increase her dose unless she discusses this with [place] first. Jillienne agrees to follow up as directed.  Diabetes II Rana has been given extensive diabetes education by myself today including ideal fasting and post-prandial blood glucose readings, individual ideal Hgb A1c goals and hypoglycemia prevention. We discussed the importance of good blood sugar control to decrease the likelihood of diabetic complications such as nephropathy, neuropathy, limb loss, blindness, coronary artery disease, and death. We discussed the importance of intensive lifestyle modification including diet, exercise and weight loss as the first line treatment for diabetes. Verley agrees to continue metformin, diet and exercise. We will recheck labs in 3 months and she will follow up at the agreed upon time.  Obesity Varvara is currently in the action stage of change. As such, her goal is to continue with weight loss efforts She has agreed to follow the Category 1 plan Arthea has been instructed to work up to a goal of 150 minutes of combined cardio and strengthening exercise per week for weight loss and overall health benefits. We discussed the following Behavioral Modification Strategies today: increasing lean protein intake, increase H2O intake, no skipping meals  Jearlene has agreed to follow up with our clinic in 2 to 3 weeks. She was informed of the importance of frequent follow up visits to maximize her success with intensive lifestyle modifications for her multiple health conditions.   OBESITY BEHAVIORAL  INTERVENTION VISIT  Today's visit was # 2 out of 22.  Starting weight: 241 lbs Starting date: 02/09/18 Today's weight : 238 lbs  Today's date: 02/23/2018 Total lbs lost to date: 3 (Patients must lose 7 lbs in the first 6 months to continue with counseling)   ASK: We discussed the diagnosis of obesity with [name] today and Saraann agreed to give [place] permission to discuss obesity behavioral modification therapy today.  ASSESS: Sameerah has the diagnosis of obesity and her BMI today is 38.43 Bridney is in the action stage of change   ADVISE: Catori was educated on the multiple health risks of obesity as well as the benefit of weight loss to improve her health. She was advised of the need for long term treatment and the importance of lifestyle modifications.  AGREE: Multiple dietary modification options and treatment options were discussed and  Jarielys agreed to the above obesity treatment plan.  I, Doreene Nest, am acting as transcriptionist for Dennard Nip, MD  I have reviewed the above documentation for accuracy and completeness, and I agree with the above. -Dennard Nip, MD

## 2018-03-15 ENCOUNTER — Ambulatory Visit (INDEPENDENT_AMBULATORY_CARE_PROVIDER_SITE_OTHER): Payer: Medicare Other | Admitting: Family Medicine

## 2018-03-20 ENCOUNTER — Ambulatory Visit (INDEPENDENT_AMBULATORY_CARE_PROVIDER_SITE_OTHER): Payer: Medicare Other | Admitting: Family Medicine

## 2018-03-20 VITALS — BP 106/70 | HR 66 | Temp 98.1°F | Ht 66.0 in | Wt 234.0 lb

## 2018-03-20 DIAGNOSIS — F410 Panic disorder [episodic paroxysmal anxiety] without agoraphobia: Secondary | ICD-10-CM | POA: Diagnosis not present

## 2018-03-20 DIAGNOSIS — F411 Generalized anxiety disorder: Secondary | ICD-10-CM | POA: Diagnosis not present

## 2018-03-20 DIAGNOSIS — E7849 Other hyperlipidemia: Secondary | ICD-10-CM

## 2018-03-20 DIAGNOSIS — Z6837 Body mass index (BMI) 37.0-37.9, adult: Secondary | ICD-10-CM

## 2018-03-20 DIAGNOSIS — E559 Vitamin D deficiency, unspecified: Secondary | ICD-10-CM | POA: Diagnosis not present

## 2018-03-20 DIAGNOSIS — F418 Other specified anxiety disorders: Secondary | ICD-10-CM | POA: Diagnosis not present

## 2018-03-20 DIAGNOSIS — F331 Major depressive disorder, recurrent, moderate: Secondary | ICD-10-CM | POA: Diagnosis not present

## 2018-03-20 MED ORDER — VITAMIN D (ERGOCALCIFEROL) 1.25 MG (50000 UNIT) PO CAPS: 50000.0000 [IU] | ORAL_CAPSULE | ORAL | 0 refills | Status: DC

## 2018-03-20 NOTE — Progress Notes (Signed)
Office: [phone]  /  Fax: [phone]   HPI:   Chief Complaint: OBESITY Erica [name] is here to discuss her progress with her obesity treatment plan. She is on the Category 1 plan and is following her eating plan approximately 87 % of the time. She states she is exercising 0 minutes 0 times per week. [name] continues to do well with weight loss on her category 1 plan. She was told she need to start doing water aerobics for her knees. She is tired with all the extra time she spends caring for her husband and her mood is somewhat depressed. Her weight is 234 lb (106.1 kg) today and has had a weight loss of 4 pounds over a period of 3 weeks since her last visit. She has lost 7 lbs since starting treatment with [place].  Vitamin D deficiency Erica [name] has a diagnosis of vitamin D deficiency. Erica [name] is stable on prescription vit D, but she is not yet at goal. She admits fatigue and denies nausea, vomiting or muscle weakness.  Hyperlipidemia Erica [name] has hyperlipidemia and she has been trying to improve her cholesterol levels with intensive lifestyle modification including a low saturated fat diet, exercise and weight loss. Her psychiatrist told her that her Zocor could be making her tired and told her to discuss this with [place]. She currently takes Zocor in the morning. She denies any chest pain or myalgias.  Depression with Anxiety Erica [name] is followed by her psychiatrist and is not doing any therapy or counseling. She is overwhelmed with caring for her husband and feels her mood is depressed. Erica [name] struggles with emotional eating and using food for comfort to the extent that it is negatively impacting her health. She often snacks when she is not hungry. Erica [name] sometimes feels she is out of control and then feels guilty that she made poor food choices. She has been working on behavior modification techniques to help reduce her emotional eating and has been somewhat successful. She shows no  sign of suicidal or homicidal ideations.  Depression screen [name] 2/9 02/09/2018 09/27/2017 09/09/2014 07/13/2012  Decreased Interest 1 0 0 1  Down, Depressed, Hopeless 2 1 0 1  PHQ - 2 Score 3 1 0 2  Altered sleeping 3 0 - -  Tired, decreased energy 3 1 - -  Change in appetite 1 - - -  Feeling bad or failure about yourself  3 0 - -  Trouble concentrating 2 0 - -  Moving slowly or fidgety/restless 0 0 - -  Suicidal thoughts 1 0 - -  PHQ-9 Score 16 2 - -  Difficult doing work/chores Somewhat difficult Not difficult at all - -     ALLERGIES: Allergies  Allergen Reactions  . Codeine Nausea And Vomiting  . Guaifenesin-Codeine Other (See Comments)    REACTION: feels spacey  . Wellbutrin [Bupropion] Palpitations    hallucinations    MEDICATIONS: Current Outpatient Medications on File Prior to Visit  Medication Sig Dispense Refill  . albuterol (PROVENTIL HFA;VENTOLIN HFA) 108 (90 Base) MCG/ACT inhaler Inhale 2 puffs into the lungs every 6 (six) hours as needed for wheezing or shortness of breath. 1 Inhaler 11  . ALPRAZolam (XANAX) 0.5 MG tablet Take 1 tablet (0.5 mg total) by mouth 2 (two) times daily as needed for anxiety. 60 tablet 1  . atenolol (TENORMIN) 25 MG tablet Take 76m (1 tablet) in the AM and 531m(2 tablets) in the PM 180 tablet 3  . baclofen (LIORESAL) 10 MG tablet baclofen  10 mg tablet  Take 1 tablet 3 times a day by oral route.    . blood glucose meter kit and supplies KIT Dispense based on patient and insurance preference. Use up to four times daily as directed. (FOR ICD-9 250.00, 250.01). 1 each 0  . ciclopirox (PENLAC) 8 % solution Apply topically at bedtime. Apply over nail & surrounding skin. Apply QD over previous coat.After7 days, may remove w alcohol, repeat 6.6 mL 3  . fluticasone (FLONASE) 50 MCG/ACT nasal spray Place 2 sprays into both nostrils daily. 48 g 11  . furosemide (LASIX) 40 MG tablet Take 1 tablet (40 mg total) by mouth 2 (two) times daily. 180 tablet  0  . hydrocortisone (ANUSOL-HC) 2.5 % rectal cream Place rectally as needed. 30 g 3  . levocetirizine (XYZAL) 5 MG tablet Take 1 tablet (5 mg total) by mouth every evening. 90 tablet 0  . linaclotide (LINZESS) 145 MCG CAPS capsule Take 1 capsule (145 mcg total) by mouth daily. 90 capsule 1  . meclizine (ANTIVERT) 25 MG tablet Take 1 tablet (25 mg total) by mouth 3 (three) times daily as needed for dizziness or nausea. 180 tablet 3  . metFORMIN (GLUCOPHAGE) 500 MG tablet Take 1 tablet (500 mg total) by mouth daily with breakfast. 90 tablet 0  . Multiple Vitamins-Minerals (MULTIVITAMIN WITH MINERALS) tablet Take 1 tablet by mouth daily.    . naproxen (NAPROSYN) 500 MG tablet Take 1 tablet (500 mg total) by mouth 2 (two) times daily with a meal. 60 tablet 0  . omeprazole (PRILOSEC) 20 MG capsule Take 1 capsule (20 mg total) by mouth daily. 90 capsule 1  . potassium chloride SA (K-DUR,KLOR-CON) 20 MEQ tablet Take 1 tablet (20 mEq total) by mouth 2 (two) times daily. 180 tablet 1  . simvastatin (ZOCOR) 40 MG tablet Take 1 tablet (40 mg total) by mouth daily at 6 PM. 90 tablet 0  . SUMAtriptan-naproxen (TREXIMET) 85-500 MG tablet Take 1 tablet by mouth every 2 (two) hours as needed for migraine. 10 tablet 8  . Vitamin D, Ergocalciferol, (DRISDOL) 50000 units CAPS capsule Take 1 capsule (50,000 Units total) by mouth every 7 (seven) days. 4 capsule 0  . promethazine (PHENERGAN) 25 MG tablet Take 1 tablet (25 mg total) by mouth every 8 (eight) hours as needed for up to 7 days for nausea or vomiting (related to dizziness). 30 tablet 2  . [DISCONTINUED] oxycodone (OXY-IR) 5 MG capsule Take 5 mg by mouth every 4 (four) hours as needed. For pain.     No current facility-administered medications on file prior to visit.     PAST MEDICAL HISTORY: Past Medical History:  Diagnosis Date  . Abnormal stress test    a. 09/2016: NST showed a small defect of mild severity present in the basal inferoseptal and mid  inferoseptal location, consistent with ischemia. --> medically managed  . ALLERGIC RHINITIS   . Anxiety   . Back pain   . Bursitis   . Constipation   . DDD (degenerative disc disease), lumbar    ESI with Ramos (spring 2016)  . Depression   . Diabetes mellitus without complication ([place])   . Dry mouth   . Dyslipidemia   . Easy bruising   . External hemorrhoids   . Fatigue   . GERD (gastroesophageal reflux disease)   . Hay fever   . Hypertension   . Knee pain   . Leg cramping   . Muscle stiffness   . Nervousness   .  Obesity   . Osteoarthritis   . Palpitations    a. prior event monitor showing sinus tachycardia, no PAF.   Marland Kitchen Panic attacks    Hx of depression  . Renal disorder   . Sinus pain   . Sleep apnea    CPAP hs  . Stress   . Trouble in sleeping     PAST SURGICAL HISTORY: Past Surgical History:  Procedure Laterality Date  . ABDOMINAL HYSTERECTOMY    . MASS EXCISION Left 12/30/2015   Procedure: EXCISION LEFT LEG MASS;  Surgeon: Donnie Mesa, MD;  Location: [facility];  Service: General;  Laterality: Left;  . REPLACEMENT TOTAL KNEE Right   . RIGHT OOPHORECTOMY     No cancer    SOCIAL HISTORY: Social History   Tobacco Use  . Smoking status: Never Smoker  . Smokeless tobacco: Never Used  Substance Use Topics  . Alcohol use: Yes    Alcohol/week: 0.6 oz    Types: 1 Glasses of wine per week    Comment: one drink a month  . Drug use: No    FAMILY HISTORY: Family History  Problem Relation Age of Onset  . Heart disease Mother        Enlarged Heart, Pacemaker  . Diabetes Mother   . Thyroid disease Mother   . Hyperlipidemia Mother   . Hypertension Mother   . Sleep apnea Mother   . Dementia Father   . Kidney failure Father   . Prostate cancer Father   . Alcoholism Father   . Asthma Other   . Allergies Sister   . Asthma Son   . Breast cancer Maternal Aunt        cousin  . Colon cancer Cousin   . Heart disease Son        CHF, morbid  obesity  . Prostate cancer Maternal Uncle   . Diabetes Son     ROS: Review of Systems  Constitutional: Positive for malaise/fatigue and weight loss.  Cardiovascular: Negative for chest pain.  Gastrointestinal: Negative for nausea and vomiting.  Musculoskeletal: Negative for myalgias.       Negative for muscle weakness  Psychiatric/Behavioral: Positive for depression. Negative for suicidal ideas. The patient is nervous/anxious (anxiety).     PHYSICAL EXAM: Blood pressure 106/70, pulse 66, temperature 98.1 F (36.7 C), temperature source Oral, height _0  (1.676 m), weight 234 lb (106.1 kg), SpO2 97 %. Body mass index is 37.77 kg/m. Physical Exam  Constitutional: She is oriented to person, place, and time. She appears well-developed and well-nourished.  Cardiovascular: Normal rate.  Pulmonary/Chest: Effort normal.  Musculoskeletal: Normal range of motion.  Neurological: She is oriented to person, place, and time.  Skin: Skin is warm and dry.  Psychiatric: She has a normal mood and affect. Her behavior is normal.  Vitals reviewed.   RECENT LABS AND TESTS: BMET    Component Value Date/Time   NA 143 02/09/2018 1053   K 4.2 02/09/2018 1053   CL 105 02/09/2018 1053   CO2 24 02/09/2018 1053   GLUCOSE 111 (H) 02/09/2018 1053   GLUCOSE 96 09/27/2017 1204   GLUCOSE 89 09/20/2006 0915   BUN 17 02/09/2018 1053   CREATININE 0.75 02/09/2018 1053   CALCIUM 9.8 02/09/2018 1053   GFRNONAA 85 02/09/2018 1053   GFRAA 97 02/09/2018 1053   Lab Results  Component Value Date   HGBA1C 6.2 (H) 02/09/2018   HGBA1C 6.4 09/27/2017   HGBA1C 6.4 03/28/2017   HGBA1C  6.2 09/27/2016   HGBA1C 6.3 03/23/2016   Lab Results  Component Value Date   INSULIN 23.1 02/09/2018   CBC    Component Value Date/Time   WBC 6.6 02/09/2018 1053   WBC 7.2 05/07/2016 1229   RBC 4.46 02/09/2018 1053   RBC 4.45 05/07/2016 1229   HGB 12.4 02/09/2018 1053   HCT 38.6 02/09/2018 1053   PLT 249.0 05/07/2016  1229   MCV 87 02/09/2018 1053   MCH 27.8 02/09/2018 1053   MCHC 32.1 02/09/2018 1053   MCHC 33.1 05/07/2016 1229   RDW 15.4 02/09/2018 1053   LYMPHSABS 1.5 02/09/2018 1053   MONOABS 0.5 05/07/2016 1229   EOSABS 0.1 02/09/2018 1053   BASOSABS 0.0 02/09/2018 1053   Iron/TIBC/Ferritin/ %Sat No results found for: IRON, TIBC, FERRITIN, IRONPCTSAT Lipid Panel     Component Value Date/Time   CHOL 183 02/09/2018 1053   TRIG 99 02/09/2018 1053   TRIG 51 09/20/2006 0915   HDL 55 02/09/2018 1053   CHOLHDL 4 09/27/2017 1204   VLDL 26.4 09/27/2017 1204   LDLCALC 108 (H) 02/09/2018 1053   Hepatic Function Panel     Component Value Date/Time   PROT 6.9 02/09/2018 1053   ALBUMIN 4.2 02/09/2018 1053   AST 16 02/09/2018 1053   ALT 11 02/09/2018 1053   ALKPHOS 154 (H) 02/09/2018 1053   BILITOT 0.2 02/09/2018 1053   BILIDIR 0.1 04/22/2014 1254      Component Value Date/Time   TSH 2.020 02/09/2018 1053   TSH 2.39 09/27/2016 1608   TSH 1.74 03/23/2016 0953   Results for DAANA, PETRASEK (MRN [id number]) as of 03/20/2018 15:27  Ref. Range 02/09/2018 10:53  Vitamin D, 25-Hydroxy Latest Ref Range: 30.0 - 100.0 ng/mL 23.3 (L)   ASSESSMENT AND PLAN: Vitamin D deficiency - Plan: Vitamin D, Ergocalciferol, (DRISDOL) 50000 units CAPS capsule  Other hyperlipidemia  Depression with anxiety  Class 2 severe obesity with serious comorbidity and body mass index (BMI) of 37.0 to 37.9 in adult, unspecified obesity type ([place])  PLAN:  Vitamin D Deficiency Eliska was informed that low vitamin D levels contributes to fatigue and are associated with obesity, breast, and colon cancer. She agrees to continue to take prescription Vit D _0 ,000 IU every week #4 with no refills and will follow up for routine testing of vitamin D, at least 2-3 times per year. She was informed of the risk of over-replacement of vitamin D and agrees to not increase her dose unless she discusses this with [place] first. Arizbeth  agrees to follow up as directed.  Hyperlipidemia Laural was informed of the American Heart Association Guidelines emphasizing intensive lifestyle modifications as the first line treatment for hyperlipidemia. We discussed many lifestyle modifications today in depth, and Gwynn will continue to work on decreasing saturated fats such as fatty red meat, butter and many fried foods. She will also increase vegetables and lean protein in her diet and continue to work on exercise and weight loss efforts. Rylynn agrees to change Zocor to take in the evening, and see if this helps with fatigue.  Depression with Anxiety We discussed behavior modification techniques today to help Peja deal with her emotional eating and depression. We will refer to Dr. Mallie Mussel for evaluation and therapy, and Karren agreed to follow up as directed.  Obesity Edona is currently in the action stage of change. As such, her goal is to continue with weight loss efforts She has agreed to follow the Category 1 plan [name]  has been instructed to work up to a goal of 150 minutes of combined cardio and strengthening exercise per week or start water aerobics 1 to times per week for weight loss and overall health benefits. We discussed the following Behavioral Modification Strategies today: increasing lean protein intake, work on meal planning and easy cooking plans and emotional eating strategies  Astha has agreed to follow up with our clinic in 2 to 3 weeks. She was informed of the importance of frequent follow up visits to maximize her success with intensive lifestyle modifications for her multiple health conditions.   OBESITY BEHAVIORAL INTERVENTION VISIT  Today's visit was # 3 out of 22.  Starting weight: 241 lbs Starting date: 02/09/18 Today's weight : 234 lbs Today's date: 03/20/2018 Total lbs lost to date: 7 (Patients must lose 7 lbs in the first 6 months to continue with counseling)   ASK: We  discussed the diagnosis of obesity with [name] today and Lyndy agreed to give [place] permission to discuss obesity behavioral modification therapy today.  ASSESS: Dorella has the diagnosis of obesity and her BMI today is 37.79 Zuley is in the action stage of change   ADVISE: Mi was educated on the multiple health risks of obesity as well as the benefit of weight loss to improve her health. She was advised of the need for long term treatment and the importance of lifestyle modifications.  AGREE: Multiple dietary modification options and treatment options were discussed and  Emyah agreed to the above obesity treatment plan.  I, Doreene Nest, am acting as transcriptionist for Dennard Nip, MD  I have reviewed the above documentation for accuracy and completeness, and I agree with the above. -Dennard Nip, MD

## 2018-03-28 ENCOUNTER — Ambulatory Visit: Payer: Medicare Other | Admitting: Internal Medicine

## 2018-04-03 NOTE — Progress Notes (Signed)
Subjective:    Patient ID: Erica [name], female    DOB: Aug 08, 1956, 62 y.o.   MRN: [id number]  HPI The patient is here for follow up.  Hypertension: She is taking her medication daily. She is compliant with a low sodium diet.  She has chest pain, palpitations both due to anxiety only. she has mild ankle edema.  She denies shortness of breath and regular headaches. She is not exercising regularly.      Diabetes: She is taking her medication daily as prescribed. She is compliant with a diabetic diet. She is not exercising regularly. She checks her feet daily and denies foot lesions. She is up-to-date with an ophthalmology examination.   Hyperlipidemia: She is taking her medication daily. She is compliant with a low fat/cholesterol diet. She is not exercising regularly. She denies myalgias.   Chronic constipation:  She is taking linzess.  This is controlling her constipation.    Medications and allergies reviewed with patient and updated if appropriate.  Patient Active Problem List   Diagnosis Date Noted  . Flatulence, eructation, and gas pain 12/07/2017  . Onychomycosis 12/07/2017  . Nausea without vomiting 12/07/2017  . Left hip pain 06/12/2017  . Acute left-sided low back pain without sciatica 06/12/2017  . Meningioma ([place]) 03/28/2017  . Panic attacks 09/27/2016  . GERD (gastroesophageal reflux disease) 09/27/2016  . Grief reaction 08/23/2016  . Right ankle pain 08/19/2016  . Diabetes ([place]) 02/15/2016  . Skin nodule 11/28/2015  . Migraine without aura and with status migrainosus, not intractable 10/03/2015  . Numbness of left hand 10/03/2015  . Cephalalgia 10/03/2015  . Abnormal ECG 09/11/2012  . Palpitations 09/08/2012  . Sciatica of left side   . Peripheral edema 05/09/2012  . FATIGUE 02/23/2010  . Obstructive sleep apnea 10/31/2009  . Morbid obesity ([place]) 10/17/2009  . HEMORRHOIDS, EXTERNAL 10/17/2009  . CONSTIPATION, CHRONIC 10/17/2009  . URTICARIA 03/11/2009    . Hyperlipidemia 01/12/2008  . Depression, major, recurrent ([place]) 01/12/2008  . Essential hypertension, benign 01/12/2008  . Allergic rhinitis 01/12/2008    Current Outpatient Medications on File Prior to Visit  Medication Sig Dispense Refill  . albuterol (PROVENTIL HFA;VENTOLIN HFA) 108 (90 Base) MCG/ACT inhaler Inhale 2 puffs into the lungs every 6 (six) hours as needed for wheezing or shortness of breath. 1 Inhaler 11  . ALPRAZolam (XANAX) 0.5 MG tablet Take 1 tablet (0.5 mg total) by mouth 2 (two) times daily as needed for anxiety. 60 tablet 1  . atenolol (TENORMIN) 25 MG tablet Take 61m (1 tablet) in the AM and 51m(2 tablets) in the PM 180 tablet 3  . baclofen (LIORESAL) 10 MG tablet baclofen 10 mg tablet  Take 1 tablet 3 times a day by oral route.    . blood glucose meter kit and supplies KIT Dispense based on patient and insurance preference. Use up to four times daily as directed. (FOR ICD-9 250.00, 250.01). 1 each 0  . ciclopirox (PENLAC) 8 % solution Apply topically at bedtime. Apply over nail & surrounding skin. Apply QD over previous coat.After7 days, may remove w alcohol, repeat 6.6 mL 3  . fluticasone (FLONASE) 50 MCG/ACT nasal spray Place 2 sprays into both nostrils daily. 48 g 11  . furosemide (LASIX) 40 MG tablet Take 1 tablet (40 mg total) by mouth 2 (two) times daily. 180 tablet 0  . hydrocortisone (ANUSOL-HC) 2.5 % rectal cream Place rectally as needed. 30 g 3  . levocetirizine (XYZAL) 5 MG tablet Take  1 tablet (5 mg total) by mouth every evening. 90 tablet 0  . linaclotide (LINZESS) 145 MCG CAPS capsule Take 1 capsule (145 mcg total) by mouth daily. 90 capsule 1  . meclizine (ANTIVERT) 25 MG tablet Take 1 tablet (25 mg total) by mouth 3 (three) times daily as needed for dizziness or nausea. 180 tablet 3  . metFORMIN (GLUCOPHAGE) 500 MG tablet Take 1 tablet (500 mg total) by mouth daily with breakfast. 90 tablet 0  . Multiple Vitamins-Minerals (MULTIVITAMIN WITH  MINERALS) tablet Take 1 tablet by mouth daily.    . naproxen (NAPROSYN) 500 MG tablet Take 1 tablet (500 mg total) by mouth 2 (two) times daily with a meal. 60 tablet 0  . omeprazole (PRILOSEC) 20 MG capsule Take 1 capsule (20 mg total) by mouth daily. 90 capsule 1  . potassium chloride SA (K-DUR,KLOR-CON) 20 MEQ tablet Take 1 tablet (20 mEq total) by mouth 2 (two) times daily. 180 tablet 1  . simvastatin (ZOCOR) 40 MG tablet Take 1 tablet (40 mg total) by mouth daily at 6 PM. 90 tablet 0  . SUMAtriptan-naproxen (TREXIMET) 85-500 MG tablet Take 1 tablet by mouth every 2 (two) hours as needed for migraine. 10 tablet 8  . Vitamin D, Ergocalciferol, (DRISDOL) 50000 units CAPS capsule Take 1 capsule (50,000 Units total) by mouth every 7 (seven) days. 4 capsule 0  . promethazine (PHENERGAN) 25 MG tablet Take 1 tablet (25 mg total) by mouth every 8 (eight) hours as needed for up to 7 days for nausea or vomiting (related to dizziness). 30 tablet 2  . [DISCONTINUED] oxycodone (OXY-IR) 5 MG capsule Take 5 mg by mouth every 4 (four) hours as needed. For pain.     No current facility-administered medications on file prior to visit.     Past Medical History:  Diagnosis Date  . Abnormal stress test    a. 09/2016: NST showed a small defect of mild severity present in the basal inferoseptal and mid inferoseptal location, consistent with ischemia. --> medically managed  . ALLERGIC RHINITIS   . Anxiety   . Back pain   . Bursitis   . Constipation   . DDD (degenerative disc disease), lumbar    ESI with Ramos (spring 2016)  . Depression   . Diabetes mellitus without complication ([place])   . Dry mouth   . Dyslipidemia   . Easy bruising   . External hemorrhoids   . Fatigue   . GERD (gastroesophageal reflux disease)   . Hay fever   . Hypertension   . Knee pain   . Leg cramping   . Muscle stiffness   . Nervousness   . Obesity   . Osteoarthritis   . Palpitations    a. prior event monitor showing sinus  tachycardia, no PAF.   Marland Kitchen Panic attacks    Hx of depression  . Renal disorder   . Sinus pain   . Sleep apnea    CPAP hs  . Stress   . Trouble in sleeping     Past Surgical History:  Procedure Laterality Date  . ABDOMINAL HYSTERECTOMY    . MASS EXCISION Left 12/30/2015   Procedure: EXCISION LEFT LEG MASS;  Surgeon: Donnie Mesa, MD;  Location: [facility];  Service: General;  Laterality: Left;  . REPLACEMENT TOTAL KNEE Right   . RIGHT OOPHORECTOMY     No cancer    Social History   Socioeconomic History  . Marital status: Married  Spouse name: [name]  . Number of children: 3  . Years of education: Not on file  . Highest education level: Not on file  Occupational History  . Occupation: Disabled  Social Needs  . Financial resource strain: Not on file  . Food insecurity:    Worry: Not on file    Inability: Not on file  . Transportation needs:    Medical: Not on file    Non-medical: Not on file  Tobacco Use  . Smoking status: Never Smoker  . Smokeless tobacco: Never Used  Substance and Sexual Activity  . Alcohol use: Yes    Alcohol/week: 0.6 oz    Types: 1 Glasses of wine per week    Comment: one drink a month  . Drug use: No  . Sexual activity: Not Currently  Lifestyle  . Physical activity:    Days per week: Not on file    Minutes per session: Not on file  . Stress: Not on file  Relationships  . Social connections:    Talks on phone: Not on file    Gets together: Not on file    Attends religious service: Not on file    Active member of club or organization: Not on file    Attends meetings of clubs or organizations: Not on file    Relationship status: Not on file  Other Topics Concern  . Not on file  Social History Narrative   Married with children. Pt is on disablity. Previous worked in the school system    Family History  Problem Relation Age of Onset  . Heart disease Mother        Enlarged Heart, Pacemaker  . Diabetes Mother   .  Thyroid disease Mother   . Hyperlipidemia Mother   . Hypertension Mother   . Sleep apnea Mother   . Dementia Father   . Kidney failure Father   . Prostate cancer Father   . Alcoholism Father   . Asthma Other   . Allergies Sister   . Asthma Son   . Breast cancer Maternal Aunt        cousin  . Colon cancer Cousin   . Heart disease Son        CHF, morbid obesity  . Prostate cancer Maternal Uncle   . Diabetes Son     Review of Systems  Constitutional: Negative for chills and fever.  Respiratory: Negative for cough, shortness of breath and wheezing.   Cardiovascular: Positive for chest pain (anxiety related), palpitations (anxiety related) and leg swelling (soemtimes left ankle).  Gastrointestinal: Positive for constipation (fairly controlled, occ has to strain).  Neurological: Positive for headaches (occasional ). Negative for light-headedness.       Objective:   Vitals:   04/04/18 1532  BP: 116/72  Pulse: 70  Resp: 16  Temp: 98.6 F (37 C)  SpO2: 98%   BP Readings from Last 3 Encounters:  04/04/18 116/72  03/20/18 106/70  02/23/18 111/75   Wt Readings from Last 3 Encounters:  04/04/18 236 lb (107 kg)  03/20/18 234 lb (106.1 kg)  02/23/18 238 lb (108 kg)   Body mass index is 38.09 kg/m.   Physical Exam    Constitutional: Appears well-developed and well-nourished. No distress.  HENT:  Head: Normocephalic and atraumatic.  Neck: Neck supple. No tracheal deviation present. No thyromegaly present.  No cervical lymphadenopathy Cardiovascular: Normal rate, regular rhythm and normal heart sounds.   No murmur heard. No carotid bruit .  No  edema Pulmonary/Chest: Effort normal and breath sounds normal. No respiratory distress. No has no wheezes. No rales.  Skin: Skin is warm and dry. Not diaphoretic.  Psychiatric: Normal mood and affect. Behavior is normal.      Assessment & Plan:    See Problem List for Assessment and Plan of chronic medical problems.

## 2018-04-03 NOTE — Patient Instructions (Addendum)
  Medications reviewed and updated.  No changes recommended at this time.     Please followup in 6 months   

## 2018-04-04 ENCOUNTER — Encounter: Payer: Self-pay | Admitting: Internal Medicine

## 2018-04-04 ENCOUNTER — Ambulatory Visit (INDEPENDENT_AMBULATORY_CARE_PROVIDER_SITE_OTHER): Payer: Medicare Other | Admitting: Internal Medicine

## 2018-04-04 VITALS — BP 116/72 | HR 70 | Temp 98.6°F | Resp 16 | Wt 236.0 lb

## 2018-04-04 DIAGNOSIS — E7849 Other hyperlipidemia: Secondary | ICD-10-CM

## 2018-04-04 DIAGNOSIS — E119 Type 2 diabetes mellitus without complications: Secondary | ICD-10-CM | POA: Diagnosis not present

## 2018-04-04 DIAGNOSIS — K5909 Other constipation: Secondary | ICD-10-CM | POA: Diagnosis not present

## 2018-04-04 DIAGNOSIS — I10 Essential (primary) hypertension: Secondary | ICD-10-CM

## 2018-04-04 NOTE — Assessment & Plan Note (Signed)
Lab Results  Component Value Date   HGBA1C 6.2 (H) 02/09/2018   Well controlled continue metformin Working on weight loss Encouraged regular exercise

## 2018-04-04 NOTE — Assessment & Plan Note (Signed)
Lipids controlled Continue statin Will try to start regular exercise

## 2018-04-04 NOTE — Assessment & Plan Note (Signed)
BP well controlled Current regimen effective and well tolerated Continue current medications at current doses  

## 2018-04-04 NOTE — Assessment & Plan Note (Signed)
Controlled, stable Continue current dose of linzess

## 2018-04-05 ENCOUNTER — Ambulatory Visit (INDEPENDENT_AMBULATORY_CARE_PROVIDER_SITE_OTHER): Payer: Medicare Other | Admitting: Psychology

## 2018-04-05 ENCOUNTER — Ambulatory Visit (INDEPENDENT_AMBULATORY_CARE_PROVIDER_SITE_OTHER): Payer: Medicare Other | Admitting: Family Medicine

## 2018-04-05 VITALS — BP 123/78 | HR 64 | Temp 98.4°F | Ht 66.0 in | Wt 232.0 lb

## 2018-04-05 DIAGNOSIS — F3289 Other specified depressive episodes: Secondary | ICD-10-CM | POA: Diagnosis not present

## 2018-04-05 DIAGNOSIS — F339 Major depressive disorder, recurrent, unspecified: Secondary | ICD-10-CM | POA: Diagnosis not present

## 2018-04-05 DIAGNOSIS — E559 Vitamin D deficiency, unspecified: Secondary | ICD-10-CM | POA: Diagnosis not present

## 2018-04-05 DIAGNOSIS — Z6837 Body mass index (BMI) 37.0-37.9, adult: Secondary | ICD-10-CM
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
## 2018-04-05 NOTE — Progress Notes (Signed)
Office: [phone]  /  Fax: [phone]   HPI:   Chief Complaint: OBESITY Erica [name] is here to discuss her progress with her obesity treatment plan. She is on the Category 1 plan and is following her eating plan approximately 85 % of the time. She states she is exercising 0 minutes 0 times per week. [name] is doing well with weight loss. She is struggling to eat all the food on the meal plan. She is not eating dinner some nights due to stress from taking care of her husband and her mother being in a nursing home. She is seeing Dr. Mallie Mussel today. Her weight is 232 lb (105.2 kg) today and has had a weight loss of 2 pounds over a period of 2 weeks since her last visit. She has lost 9 lbs since starting treatment with [place].  Vitamin D deficiency Erica [name] has a diagnosis of vitamin D deficiency. She is currently taking vit D. Erica [name] complains of intermittent fatigue and she denies nausea, vomiting or muscle weakness.  Depression with emotional eating behaviors Erica [name] struggles with depression and anxiety due to taking care of her husband who has had CVA's two ties. Erica [name] struggles with emotional eating and using food for comfort to the extent that it is negatively impacting her health. She often snacks when she is not hungry. Erica [name] sometimes feels she is out of control and then feels guilty that she made poor food choices. She has been working on behavior modification techniques to help reduce her emotional eating and has been somewhat successful. She shows no sign of suicidal or homicidal ideations.  Depression screen [facility] 2/9 02/09/2018 09/27/2017 09/09/2014 07/13/2012  Decreased Interest 1 0 0 1  Down, Depressed, Hopeless 2 1 0 1  PHQ - 2 Score 3 1 0 2  Altered sleeping 3 0 - -  Tired, decreased energy 3 1 - -  Change in appetite 1 - - -  Feeling bad or failure about yourself  3 0 - -  Trouble concentrating 2 0 - -  Moving slowly or fidgety/restless 0 0 - -  Suicidal  thoughts 1 0 - -  PHQ-9 Score 16 2 - -  Difficult doing work/chores Somewhat difficult Not difficult at all - -     ALLERGIES: Allergies  Allergen Reactions  . Codeine Nausea And Vomiting  . Guaifenesin-Codeine Other (See Comments)    REACTION: feels spacey  . Wellbutrin [Bupropion] Palpitations    hallucinations    MEDICATIONS: Current Outpatient Medications on File Prior to Visit  Medication Sig Dispense Refill  . albuterol (PROVENTIL HFA;VENTOLIN HFA) 108 (90 Base) MCG/ACT inhaler Inhale 2 puffs into the lungs every 6 (six) hours as needed for wheezing or shortness of breath. 1 Inhaler 11  . ALPRAZolam (XANAX) 0.5 MG tablet Take 1 tablet (0.5 mg total) by mouth 2 (two) times daily as needed for anxiety. 60 tablet 1  . atenolol (TENORMIN) 25 MG tablet Take 26m (1 tablet) in the AM and 528m(2 tablets) in the PM 180 tablet 3  . baclofen (LIORESAL) 10 MG tablet baclofen 10 mg tablet  Take 1 tablet 3 times a day by oral route.    . blood glucose meter kit and supplies KIT Dispense based on patient and insurance preference. Use up to four times daily as directed. (FOR ICD-9 250.00, 250.01). 1 each 0  . ciclopirox (PENLAC) 8 % solution Apply topically at bedtime. Apply over nail & surrounding skin. Apply QD over previous coat.After7 days, may remove  w alcohol, repeat 6.6 mL 3  . fluticasone (FLONASE) 50 MCG/ACT nasal spray Place 2 sprays into both nostrils daily. 48 g 11  . furosemide (LASIX) 40 MG tablet Take 1 tablet (40 mg total) by mouth 2 (two) times daily. 180 tablet 0  . hydrocortisone (ANUSOL-HC) 2.5 % rectal cream Place rectally as needed. 30 g 3  . levocetirizine (XYZAL) 5 MG tablet Take 1 tablet (5 mg total) by mouth every evening. 90 tablet 0  . linaclotide (LINZESS) 145 MCG CAPS capsule Take 1 capsule (145 mcg total) by mouth daily. 90 capsule 1  . meclizine (ANTIVERT) 25 MG tablet Take 1 tablet (25 mg total) by mouth 3 (three) times daily as needed for dizziness or nausea.  180 tablet 3  . metFORMIN (GLUCOPHAGE) 500 MG tablet Take 1 tablet (500 mg total) by mouth daily with breakfast. 90 tablet 0  . Multiple Vitamins-Minerals (MULTIVITAMIN WITH MINERALS) tablet Take 1 tablet by mouth daily.    . naproxen (NAPROSYN) 500 MG tablet Take 1 tablet (500 mg total) by mouth 2 (two) times daily with a meal. 60 tablet 0  . omeprazole (PRILOSEC) 20 MG capsule Take 1 capsule (20 mg total) by mouth daily. 90 capsule 1  . potassium chloride SA (K-DUR,KLOR-CON) 20 MEQ tablet Take 1 tablet (20 mEq total) by mouth 2 (two) times daily. 180 tablet 1  . simvastatin (ZOCOR) 40 MG tablet Take 1 tablet (40 mg total) by mouth daily at 6 PM. 90 tablet 0  . SUMAtriptan-naproxen (TREXIMET) 85-500 MG tablet Take 1 tablet by mouth every 2 (two) hours as needed for migraine. 10 tablet 8  . Vitamin D, Ergocalciferol, (DRISDOL) 50000 units CAPS capsule Take 1 capsule (50,000 Units total) by mouth every 7 (seven) days. 4 capsule 0  . promethazine (PHENERGAN) 25 MG tablet Take 1 tablet (25 mg total) by mouth every 8 (eight) hours as needed for up to 7 days for nausea or vomiting (related to dizziness). 30 tablet 2  . [DISCONTINUED] oxycodone (OXY-IR) 5 MG capsule Take 5 mg by mouth every 4 (four) hours as needed. For pain.     No current facility-administered medications on file prior to visit.     PAST MEDICAL HISTORY: Past Medical History:  Diagnosis Date  . Abnormal stress test    a. 09/2016: NST showed a small defect of mild severity present in the basal inferoseptal and mid inferoseptal location, consistent with ischemia. --> medically managed  . ALLERGIC RHINITIS   . Anxiety   . Back pain   . Bursitis   . Constipation   . DDD (degenerative disc disease), lumbar    ESI with [facility] (spring 2016)  . Depression   . Diabetes mellitus without complication ([place])   . Dry mouth   . Dyslipidemia   . Easy bruising   . External hemorrhoids   . Fatigue   . GERD (gastroesophageal reflux  disease)   . Hay fever   . Hypertension   . Knee pain   . Leg cramping   . Muscle stiffness   . Nervousness   . Obesity   . Osteoarthritis   . Palpitations    a. prior event monitor showing sinus tachycardia, no PAF.   Marland Kitchen Panic attacks    Hx of depression  . Renal disorder   . Sinus pain   . Sleep apnea    CPAP hs  . Stress   . Trouble in sleeping     PAST SURGICAL HISTORY: Past Surgical  History:  Procedure Laterality Date  . ABDOMINAL HYSTERECTOMY    . MASS EXCISION Left 12/30/2015   Procedure: EXCISION LEFT LEG MASS;  Surgeon: Donnie Mesa, MD;  Location: [facility];  Service: General;  Laterality: Left;  . REPLACEMENT TOTAL KNEE Right   . RIGHT OOPHORECTOMY     No cancer    SOCIAL HISTORY: Social History   Tobacco Use  . Smoking status: Never Smoker  . Smokeless tobacco: Never Used  Substance Use Topics  . Alcohol use: Yes    Alcohol/week: 0.6 oz    Types: 1 Glasses of wine per week    Comment: one drink a month  . Drug use: No    FAMILY HISTORY: Family History  Problem Relation Age of Onset  . Heart disease Mother        Enlarged Heart, Pacemaker  . Diabetes Mother   . Thyroid disease Mother   . Hyperlipidemia Mother   . Hypertension Mother   . Sleep apnea Mother   . Dementia Father   . Kidney failure Father   . Prostate cancer Father   . Alcoholism Father   . Asthma Other   . Allergies Sister   . Asthma Son   . Breast cancer Maternal Aunt        cousin  . Colon cancer Cousin   . Heart disease Son        CHF, morbid obesity  . Prostate cancer Maternal Uncle   . Diabetes Son     ROS: Review of Systems  Constitutional: Positive for malaise/fatigue and weight loss.  Gastrointestinal: Negative for nausea and vomiting.  Musculoskeletal:       Negative for muscle weakness  Psychiatric/Behavioral: Positive for depression. Negative for suicidal ideas. The patient is nervous/anxious (anxiety).        Positive for stress     PHYSICAL EXAM: Blood pressure 123/78, pulse 64, temperature 98.4 F (36.9 C), temperature source Oral, height '5\' 6"'  (1.676 m), weight 232 lb (105.2 kg), SpO2 99 %. Body mass index is 37.45 kg/m. Physical Exam  Constitutional: She is oriented to person, place, and time. She appears well-developed and well-nourished.  Cardiovascular: Normal rate.  Pulmonary/Chest: Effort normal.  Musculoskeletal: Normal range of motion.  Neurological: She is oriented to person, place, and time.  Skin: Skin is warm and dry.  Psychiatric: She has a normal mood and affect. Her behavior is normal.  Vitals reviewed.   RECENT LABS AND TESTS: BMET    Component Value Date/Time   NA 143 02/09/2018 1053   K 4.2 02/09/2018 1053   CL 105 02/09/2018 1053   CO2 24 02/09/2018 1053   GLUCOSE 111 (H) 02/09/2018 1053   GLUCOSE 96 09/27/2017 1204   GLUCOSE 89 09/20/2006 0915   BUN 17 02/09/2018 1053   CREATININE 0.75 02/09/2018 1053   CALCIUM 9.8 02/09/2018 1053   GFRNONAA 85 02/09/2018 1053   GFRAA 97 02/09/2018 1053   Lab Results  Component Value Date   HGBA1C 6.2 (H) 02/09/2018   HGBA1C 6.4 09/27/2017   HGBA1C 6.4 03/28/2017   HGBA1C 6.2 09/27/2016   HGBA1C 6.3 03/23/2016   Lab Results  Component Value Date   INSULIN 23.1 02/09/2018   CBC    Component Value Date/Time   WBC 6.6 02/09/2018 1053   WBC 7.2 05/07/2016 1229   RBC 4.46 02/09/2018 1053   RBC 4.45 05/07/2016 1229   HGB 12.4 02/09/2018 1053   HCT 38.6 02/09/2018 1053   PLT 249.0  05/07/2016 1229   MCV 87 02/09/2018 1053   MCH 27.8 02/09/2018 1053   MCHC 32.1 02/09/2018 1053   MCHC 33.1 05/07/2016 1229   RDW 15.4 02/09/2018 1053   LYMPHSABS 1.5 02/09/2018 1053   MONOABS 0.5 05/07/2016 1229   EOSABS 0.1 02/09/2018 1053   BASOSABS 0.0 02/09/2018 1053   Iron/TIBC/Ferritin/ %Sat No results found for: IRON, TIBC, FERRITIN, IRONPCTSAT Lipid Panel     Component Value Date/Time   CHOL 183 02/09/2018 1053   TRIG 99 02/09/2018  1053   TRIG 51 09/20/2006 0915   HDL 55 02/09/2018 1053   CHOLHDL 4 09/27/2017 1204   VLDL 26.4 09/27/2017 1204   LDLCALC 108 (H) 02/09/2018 1053   Hepatic Function Panel     Component Value Date/Time   PROT 6.9 02/09/2018 1053   ALBUMIN 4.2 02/09/2018 1053   AST 16 02/09/2018 1053   ALT 11 02/09/2018 1053   ALKPHOS 154 (H) 02/09/2018 1053   BILITOT 0.2 02/09/2018 1053   BILIDIR 0.1 04/22/2014 1254      Component Value Date/Time   TSH 2.020 02/09/2018 1053   TSH 2.39 09/27/2016 1608   TSH 1.74 03/23/2016 0953   Results for PERI, KREFT D "HOLLIS" (MRN [id number]) as of 04/05/2018 15:02  Ref. Range 02/09/2018 10:53  Vitamin D, 25-Hydroxy Latest Ref Range: 30.0 - 100.0 ng/mL 23.3 (L)   ASSESSMENT AND PLAN: Vitamin D deficiency  Other depression - with emotional eating  Class 2 severe obesity with serious comorbidity and body mass index (BMI) of 37.0 to 37.9 in adult, unspecified obesity type ([place])  PLAN:  Vitamin D Deficiency Evianna was informed that low vitamin D levels contributes to fatigue and are associated with obesity, breast, and colon cancer. She agrees to continue to take prescription Vit D '@50' ,000 IU every week. We will recheck labs in 2 months and she will follow up for routine testing of vitamin D, at least 2-3 times per year. She was informed of the risk of over-replacement of vitamin D and agrees to not increase her dose unless she discusses this with [place] first.  Depression with Emotional Eating Behaviors We discussed behavior modification techniques today to help Bari deal with her emotional eating and depression. She is to see Dr. Mallie Mussel today. Ahmia agrees to follow up as directed.  We spent > than 50% of the 15 minute visit on the counseling as documented in the note.  Obesity Ula is currently in the action stage of change. As such, her goal is to continue with weight loss efforts She has agreed to follow the Category 1 plan Jacob  has been instructed to work up to a goal of 150 minutes of combined cardio and strengthening exercise per week for weight loss and overall health benefits. We discussed the following Behavioral Modification Strategies today: no skipping meals, increasing lean protein intake and work on meal planning and easy cooking plans  Frona was educated on the importance of protein intake and meal planning today. She may split up her protein and eat as snacks throughout the day.  Hanalei has agreed to follow up with our clinic in 2 to 3 weeks. She was informed of the importance of frequent follow up visits to maximize her success with intensive lifestyle modifications for her multiple health conditions.   OBESITY BEHAVIORAL INTERVENTION VISIT  Today's visit was # 4 out of 22.  Starting weight: 241 lbs Starting date: 02/09/18 Today's weight : 232 lbs Today's date: 04/05/2018 Total lbs lost to  date: 23 (Patients must lose 7 lbs in the first 6 months to continue with counseling)   ASK: We discussed the diagnosis of obesity with [name] today and Iliani agreed to give [place] permission to discuss obesity behavioral modification therapy today.  ASSESS: Sybella has the diagnosis of obesity and her BMI today is 37.46 Kinley is in the action stage of change   ADVISE: Alisia was educated on the multiple health risks of obesity as well as the benefit of weight loss to improve her health. She was advised of the need for long term treatment and the importance of lifestyle modifications.  AGREE: Multiple dietary modification options and treatment options were discussed and  Sameeha agreed to the above obesity treatment plan.  I, Doreene Nest, am acting as transcriptionist for Dennard Nip, MD  I have reviewed the above documentation for accuracy and completeness, and I agree with the above. -Dennard Nip, MD

## 2018-04-18 NOTE — Progress Notes (Signed)
Office: [phone]  /  Fax: [phone]    Date: April 27, 2018 Time Seen: 10:07am Duration: 33 minutes Provider: Glennie Isle, Psy.D. Type of Session: Individual Therapy   HPI: Erica [name] was referred by Dr. Dennard Nip due to depression with anxiety. Per the note for the office visit with Dr. Leafy Ro on March 20, 2018, "Elizabethis followed by her psychiatrist and is not doing any therapy or counseling. She is overwhelmed with caring for her husband and feels her mood is depressed. Erica [name] struggleswith emotional eating and using food for comfort to the extent that it is negatively impacting herhealth. Sheoften snacks when sheis not hungry. Elizabethsometimes feels sheis out of control and then feels guilty that shemade poor food choices. Erica [name] been working on behavior modification techniques to help reduce heremotional eating and has been somewhat successful.Sheshows no sign of suicidal or homicidal ideations." Per the note for the initial office visit with Dr. Leafy Ro on Feb 09, 2018, Elizabethhas significant food cravings issues, snacks frequently in the evenings, skips meals frequently, frequently drinking liquids with calories,frequently makes poor food choices,has binge eating behaviors, and struggles with emotional eating.  Per the note for the initial visit with this provider on April 05, 2018, Erica [name] reported she "wasn't eating enough," and noted improvement since starting with the clinic. Erica [name] shared she engages in emotional eating when experiencing negative feelings (e.g., upset about something). Per Erica [name], shestarted gaining weight ten years ago and herheaviest weight ever was 250pounds. Before she started gaining weight, she shared she was not eating and lost weight secondary to marital stressors.   Session Content: Session focused on the goals of decreasing emotional eating and increasing coping skills. The session was initiated with the administration  of the PHQ-9 and GAD-7, as well as a brief check-in. The change in symptom scores were shared with Erica [name]. Erica [name] shared she received a prescription for a sleeping aide yesterday. She also reported she visited her sister during the holiday. Moreover, Erica [name] attributed changes in her symptoms to her mother adjusting well to her new living environment. However, she noted, "My mother depends on me way too much." Session then focused on reviewing emotional versus physical hunger. Erica [name] reported, "I make myself eat." She described not wanting to eat for periods, then making unhealthy choices and eating larger portions. This was explored further. Erica [name] of session focused on coping via focusing on self-care as decreased mood impacts her appetite. The connection between thoughts, feelings, and behaviors were introduced to [place]. Furthermore, the provider discussed the benefit of engagement in pleasurable activities to assist with self-care and the subsequent impact on thoughts and feelings. Erica [name] was receptive to today's session as evidenced by discussing events from the past week and her willingness to identify and engage in pleasurable activities listed on the handout provided to her during today's appointment between now and the next appointment.   Mental Status Examination: Erica [name] presented late for the appointment. She presented as appropriately dressed and groomed. Erica [name] appeared her stated age and demonstrated adequate orientation to time, place, person, and purpose of the appointment. She also demonstrated appropriate eye contact. No psychomotor abnormalities or behavioral peculiarities noted. Her mood was euthymic with congruent affect. Her thought processes were logical, linear, and goal-directed. No hallucinations, delusions, bizarre thinking or behavior reported or observed. Judgment, insight, and impulse control appeared to be grossly intact. There was no evidence of  paraphasias (i.e., errors in speech, gross mispronunciations, and word substitutions), repetition deficits, or disturbances in volume or prosody (i.e., rhythm and  intonation). There was no evidence of attention or memory impairments. Erica [name] denied current suicidal and homicidal ideation, intent or plan.  Structured Assessment Results: The Patient Health Questionnaire-9 (PHQ-9) is a self-report measure that assesses symptoms and severity of depression over the course of the last two weeks. Shahad obtained a score of nine suggesting mild depression. Erica [name] finds the aforementioned symptoms to be somewhat difficult. Depression screen PHQ 2/9 04/27/2018  Decreased Interest 2  Down, Depressed, Hopeless 1  PHQ - 2 Score 3  Altered sleeping 1  Tired, decreased energy 1  Change in appetite 2  Feeling bad or failure about yourself  0  Trouble concentrating 1  Moving slowly or fidgety/restless 1  Suicidal thoughts 0  PHQ-9 Score 9  Difficult doing work/chores -   The Generalized Anxiety Disorder-7 (GAD-7) is a brief self-report measure that assesses symptoms of anxiety over the course of the last two weeks. Erica [name] obtained a score of 11 suggesting moderate anxiety.  GAD 7 : Generalized Anxiety Score 04/27/2018 04/05/2018  Nervous, Anxious, on Edge 2 2  Control/stop worrying 1 1  Worry too much - different things 1 2  Trouble relaxing 2 2  Restless 2 1  Easily annoyed or irritable 2 1  Afraid - awful might happen 1 1  Total GAD 7 Score 11 10  Anxiety Difficulty Somewhat difficult -   Interventions: Content from the last session was reviewed (I.e., physical versus emotional hunger), and Erica [name] was administered the PHQ-9 and GAD-7 for symptom monitoring. Throughout today's session, empathic reflections and validation were provided. Psychoeducation regarding self-care; the connection between thoughts, feelings, and behaviors; and pleasurable activities was provided.   DSM-5 Diagnosis:  296.32 (F33.1) Major Depressive Disorder, Moderate, Recurrent, With Anxious Distress   Plan: Erica [name] continues to appear able and willing to participate as evidenced by engagement in reciprocal conversation and asking questions for clarification as needed. The next appointment will be scheduled in two weeks. The next session will focus on reviewing learned skills, and working towards established treatment goals.

## 2018-04-21 ENCOUNTER — Ambulatory Visit: Payer: Self-pay | Admitting: Internal Medicine

## 2018-04-21 DIAGNOSIS — J301 Allergic rhinitis due to pollen: Secondary | ICD-10-CM

## 2018-04-21 NOTE — Telephone Encounter (Signed)
Pt calling with suspected spider bite. Suspected bite located right upper arm. Pt denies redness or edema to site. Pt states she is having bruising to left upper arm. Pt states the itching is severe. Pt stated that while she is not SOB or tachypneic she feels like she is sighing more.  Home care advice given. Called pt back to offer appt for [facility]- message left on voice mail.  Reason for Disposition . Itchy insect bite  Answer Assessment - Initial Assessment Questions 1. TYPE of INSECT: "What type of insect was it?"      Spider  2. ONSET: "When did you get bitten?"      Pt does not know 3. LOCATION: "Where is the insect bite located?"       Right upper arm 4. REDNESS: "Is the area red or pink?" If so, ask "What size is area of redness?" (inches or cm). "When did the redness start?"     No redness Bruising left upper arm 5. PAIN: "Is there any pain?" If so, ask: "How bad is it?"  (Scale 1-10; or mild, moderate, severe)     no 6. ITCHING: "Does it itch?" If so, ask: "How bad is the itch?"    - MILD: doesn't interfere with normal activities   - MODERATE - SEVERE: interferes with work, school, sleep, or other activities      severe 7. SWELLING: "How big is the swelling?" (inches, cm, or compare to coins)     No swelling 8. OTHER SYMPTOMS: "Do you have any other symptoms?"  (e.g., difficulty breathing, hives)     Pt states sighing a lot 9. PREGNANCY: "Is there any chance you are pregnant?" "When was your last menstrual period?"     n/a  Protocols used: INSECT BITE-A-AH

## 2018-04-21 NOTE — Telephone Encounter (Signed)
Spoke with pt. Advised she should be seen at [facility] or an urgent care. Pt did not schedule appt at this time.

## 2018-04-21 NOTE — Telephone Encounter (Signed)
If concerning symptoms - she should be seen today or tomorrow at latest - can take bendaryl if needed

## 2018-04-24 ENCOUNTER — Telehealth: Payer: Self-pay | Admitting: Internal Medicine

## 2018-04-24 NOTE — Telephone Encounter (Signed)
Copied from [place] [phone]. Topic: Quick Communication - See Telephone Encounter >> Apr 24, 2018  3:58 PM Conception Chancy, NT wrote: CRM for notification. See Telephone encounter for: 04/24/18.  Patient is calling and requesting a refill on simvastatin (ZOCOR) 40 MG tablet, metFORMIN (GLUCOPHAGE) 500 MG tablet  and levocetirizine (XYZAL) 5 MG tablet. Please advise.  [facility] - [place], [place] - [address] [place] [address] Phone: [phone] Fax: [phone]

## 2018-04-25 ENCOUNTER — Other Ambulatory Visit (INDEPENDENT_AMBULATORY_CARE_PROVIDER_SITE_OTHER): Payer: Self-pay | Admitting: Family Medicine

## 2018-04-25 DIAGNOSIS — E559 Vitamin D deficiency, unspecified: Secondary | ICD-10-CM

## 2018-04-25 MED ORDER — METFORMIN HCL 500 MG PO TABS: 500.0000 mg | ORAL_TABLET | Freq: Every day | ORAL | 0 refills | Status: DC

## 2018-04-25 MED ORDER — OMEPRAZOLE 20 MG PO CPDR: 20.0000 mg | DELAYED_RELEASE_CAPSULE | Freq: Every day | ORAL | 1 refills | Status: DC

## 2018-04-25 MED ORDER — LEVOCETIRIZINE DIHYDROCHLORIDE 5 MG PO TABS
5.0000 mg | ORAL_TABLET | Freq: Every evening | ORAL | 0 refills | Status: DC
Start: 2018-04-25 — End: 2018-10-19

## 2018-04-25 NOTE — Telephone Encounter (Signed)
Patient requesting refill on the following medications .  Zocor Metformin,Xyzak    PCP : Joyce:

## 2018-04-25 NOTE — Addendum Note (Signed)
Addended by: Terence Lux B on: 04/25/2018 11:15 AM   Modules accepted: Orders

## 2018-04-25 NOTE — Telephone Encounter (Signed)
Patient called back and states she needs a refill for another medication . omeprazole (PRILOSEC) 20 MG capsule

## 2018-04-26 DIAGNOSIS — M5136 Other intervertebral disc degeneration, lumbar region: Secondary | ICD-10-CM | POA: Diagnosis not present

## 2018-04-26 DIAGNOSIS — M5416 Radiculopathy, lumbar region: Secondary | ICD-10-CM | POA: Diagnosis not present

## 2018-04-27 ENCOUNTER — Ambulatory Visit (INDEPENDENT_AMBULATORY_CARE_PROVIDER_SITE_OTHER): Payer: Medicare Other | Admitting: Family Medicine

## 2018-04-27 ENCOUNTER — Ambulatory Visit (INDEPENDENT_AMBULATORY_CARE_PROVIDER_SITE_OTHER): Payer: Medicare Other | Admitting: Psychology

## 2018-04-27 VITALS — BP 110/74 | HR 62 | Temp 97.8°F | Ht 66.0 in | Wt 231.0 lb

## 2018-04-27 DIAGNOSIS — E559 Vitamin D deficiency, unspecified: Secondary | ICD-10-CM | POA: Diagnosis not present

## 2018-04-27 DIAGNOSIS — F331 Major depressive disorder, recurrent, moderate: Secondary | ICD-10-CM

## 2018-04-27 DIAGNOSIS — Z6837 Body mass index (BMI) 37.0-37.9, adult: Secondary | ICD-10-CM

## 2018-04-27 MED ORDER — VITAMIN D (ERGOCALCIFEROL) 1.25 MG (50000 UNIT) PO CAPS: 50000.0000 [IU] | ORAL_CAPSULE | ORAL | 0 refills | Status: DC

## 2018-04-27 NOTE — Progress Notes (Signed)
 Office: [phone]  /  Fax: [phone]   HPI:   Chief Complaint: OBESITY Erica [name] is here to discuss her progress with her obesity treatment plan. She is on the Category 1 plan and is following her eating plan approximately 88 % of the time. She states she is exercising 0 minutes 0 times per week. Erica [name] continues to do well with weight loss. She is also seeing Dr. Barker and feels those visits are beneficial. She is getting bored with the category 1 plan and is not eating all her food. Her weight is 231 lb (104.8 kg) today and has had a weight loss of 1 pound over a period of 3 weeks since her last visit. She has lost 10 lbs since starting treatment with us.  Vitamin D deficiency Erica [name] has a diagnosis of vitamin D deficiency. Erica [name] is stable on vit D. She is not yet at goal but fatigue is improving  and she denies nausea, vomiting or muscle weakness.  ALLERGIES: Allergies  Allergen Reactions  . Codeine Nausea And Vomiting  . Guaifenesin-Codeine Other (See Comments)    REACTION: feels spacey  . Wellbutrin [Bupropion] Palpitations    hallucinations    MEDICATIONS: Current Outpatient Medications on File Prior to Visit  Medication Sig Dispense Refill  . albuterol (PROVENTIL HFA;VENTOLIN HFA) 108 (90 Base) MCG/ACT inhaler Inhale 2 puffs into the lungs every 6 (six) hours as needed for wheezing or shortness of breath. 1 Inhaler 11  . ALPRAZolam (XANAX) 0.5 MG tablet Take 1 tablet (0.5 mg total) by mouth 2 (two) times daily as needed for anxiety. 60 tablet 1  . atenolol (TENORMIN) 25 MG tablet Take 25mg (1 tablet) in the AM and 50mg (2 tablets) in the PM 180 tablet 3  . baclofen (LIORESAL) 10 MG tablet baclofen 10 mg tablet  Take 1 tablet 3 times a day by oral route.    . blood glucose meter kit and supplies KIT Dispense based on patient and insurance preference. Use up to four times daily as directed. (FOR ICD-9 250.00, 250.01). 1 each 0  . ciclopirox (PENLAC) 8 %  solution Apply topically at bedtime. Apply over nail & surrounding skin. Apply QD over previous coat.After7 days, may remove w alcohol, repeat 6.6 mL 3  . fluticasone (FLONASE) 50 MCG/ACT nasal spray Place 2 sprays into both nostrils daily. 48 g 11  . furosemide (LASIX) 40 MG tablet Take 1 tablet (40 mg total) by mouth 2 (two) times daily. 180 tablet 0  . hydrocortisone (ANUSOL-HC) 2.5 % rectal cream Place rectally as needed. 30 g 3  . levocetirizine (XYZAL) 5 MG tablet Take 1 tablet (5 mg total) by mouth every evening. -- Office visit needed for further refills 90 tablet 0  . linaclotide (LINZESS) 145 MCG CAPS capsule Take 1 capsule (145 mcg total) by mouth daily. 90 capsule 1  . meclizine (ANTIVERT) 25 MG tablet Take 1 tablet (25 mg total) by mouth 3 (three) times daily as needed for dizziness or nausea. 180 tablet 3  . metFORMIN (GLUCOPHAGE) 500 MG tablet Take 1 tablet (500 mg total) by mouth daily with breakfast. -- Office visit needed for further refills 90 tablet 0  . Multiple Vitamins-Minerals (MULTIVITAMIN WITH MINERALS) tablet Take 1 tablet by mouth daily.    . naproxen (NAPROSYN) 500 MG tablet Take 1 tablet (500 mg total) by mouth 2 (two) times daily with a meal. 60 tablet 0  . omeprazole (PRILOSEC) 20 MG capsule Take 1 capsule (20 mg total) by   mouth daily. 90 capsule 1  . potassium chloride SA (K-DUR,KLOR-CON) 20 MEQ tablet Take 1 tablet (20 mEq total) by mouth 2 (two) times daily. 180 tablet 1  . simvastatin (ZOCOR) 40 MG tablet Take 1 tablet (40 mg total) by mouth daily at 6 PM. 90 tablet 0  . SUMAtriptan-naproxen (TREXIMET) 85-500 MG tablet Take 1 tablet by mouth every 2 (two) hours as needed for migraine. 10 tablet 8  . promethazine (PHENERGAN) 25 MG tablet Take 1 tablet (25 mg total) by mouth every 8 (eight) hours as needed for up to 7 days for nausea or vomiting (related to dizziness). 30 tablet 2  . [DISCONTINUED] oxycodone (OXY-IR) 5 MG capsule Take 5 mg by mouth every 4 (four)  hours as needed. For pain.     No current facility-administered medications on file prior to visit.     PAST MEDICAL HISTORY: Past Medical History:  Diagnosis Date  . Abnormal stress test    a. 09/2016: NST showed a small defect of mild severity present in the basal inferoseptal and mid inferoseptal location, consistent with ischemia. --> medically managed  . ALLERGIC RHINITIS   . Anxiety   . Back pain   . Bursitis   . Constipation   . DDD (degenerative disc disease), lumbar    ESI with [facility] (spring 2016)  . Depression   . Diabetes mellitus without complication ([place])   . Dry mouth   . Dyslipidemia   . Easy bruising   . External hemorrhoids   . Fatigue   . GERD (gastroesophageal reflux disease)   . Hay fever   . Hypertension   . Knee pain   . Leg cramping   . Muscle stiffness   . Nervousness   . Obesity   . Osteoarthritis   . Palpitations    a. prior event monitor showing sinus tachycardia, no PAF.   Marland Kitchen Panic attacks    Hx of depression  . Renal disorder   . Sinus pain   . Sleep apnea    CPAP hs  . Stress   . Trouble in sleeping     PAST SURGICAL HISTORY: Past Surgical History:  Procedure Laterality Date  . ABDOMINAL HYSTERECTOMY    . MASS EXCISION Left 12/30/2015   Procedure: EXCISION LEFT LEG MASS;  Surgeon: Donnie Mesa, MD;  Location: [facility];  Service: General;  Laterality: Left;  . REPLACEMENT TOTAL KNEE Right   . RIGHT OOPHORECTOMY     No cancer    SOCIAL HISTORY: Social History   Tobacco Use  . Smoking status: Never Smoker  . Smokeless tobacco: Never Used  Substance Use Topics  . Alcohol use: Yes    Alcohol/week: 0.6 oz    Types: 1 Glasses of wine per week    Comment: one drink a month  . Drug use: No    FAMILY HISTORY: Family History  Problem Relation Age of Onset  . Heart disease Mother        Enlarged Heart, Pacemaker  . Diabetes Mother   . Thyroid disease Mother   . Hyperlipidemia Mother   . Hypertension Mother    . Sleep apnea Mother   . Dementia Father   . Kidney failure Father   . Prostate cancer Father   . Alcoholism Father   . Asthma Other   . Allergies Sister   . Asthma Son   . Breast cancer Maternal Aunt        cousin  . Colon cancer Cousin   .  Heart disease Son        CHF, morbid obesity  . Prostate cancer Maternal Uncle   . Diabetes Son     ROS: Review of Systems  Constitutional: Positive for malaise/fatigue and weight loss.  Gastrointestinal: Negative for nausea and vomiting.  Musculoskeletal:       Negative for muscle weakness    PHYSICAL EXAM: Blood pressure 110/74, pulse 62, temperature 97.8 F (36.6 C), temperature source Oral, height 5' 6" (1.676 m), weight 231 lb (104.8 kg), SpO2 100 %. Body mass index is 37.28 kg/m. Physical Exam  Constitutional: She is oriented to person, place, and time. She appears well-developed and well-nourished.  Cardiovascular: Normal rate.  Pulmonary/Chest: Effort normal.  Musculoskeletal: Normal range of motion.  Neurological: She is oriented to person, place, and time.  Skin: Skin is warm and dry.  Psychiatric: She has a normal mood and affect. Her behavior is normal.  Vitals reviewed.   RECENT LABS AND TESTS: BMET    Component Value Date/Time   NA 143 02/09/2018 1053   K 4.2 02/09/2018 1053   CL 105 02/09/2018 1053   CO2 24 02/09/2018 1053   GLUCOSE 111 (H) 02/09/2018 1053   GLUCOSE 96 09/27/2017 1204   GLUCOSE 89 09/20/2006 0915   BUN 17 02/09/2018 1053   CREATININE 0.75 02/09/2018 1053   CALCIUM 9.8 02/09/2018 1053   GFRNONAA 85 02/09/2018 1053   GFRAA 97 02/09/2018 1053   Lab Results  Component Value Date   HGBA1C 6.2 (H) 02/09/2018   HGBA1C 6.4 09/27/2017   HGBA1C 6.4 03/28/2017   HGBA1C 6.2 09/27/2016   HGBA1C 6.3 03/23/2016   Lab Results  Component Value Date   INSULIN 23.1 02/09/2018   CBC    Component Value Date/Time   WBC 6.6 02/09/2018 1053   WBC 7.2 05/07/2016 1229   RBC 4.46 02/09/2018 1053     RBC 4.45 05/07/2016 1229   HGB 12.4 02/09/2018 1053   HCT 38.6 02/09/2018 1053   PLT 249.0 05/07/2016 1229   MCV 87 02/09/2018 1053   MCH 27.8 02/09/2018 1053   MCHC 32.1 02/09/2018 1053   MCHC 33.1 05/07/2016 1229   RDW 15.4 02/09/2018 1053   LYMPHSABS 1.5 02/09/2018 1053   MONOABS 0.5 05/07/2016 1229   EOSABS 0.1 02/09/2018 1053   BASOSABS 0.0 02/09/2018 1053   Iron/TIBC/Ferritin/ %Sat No results found for: IRON, TIBC, FERRITIN, IRONPCTSAT Lipid Panel     Component Value Date/Time   CHOL 183 02/09/2018 1053   TRIG 99 02/09/2018 1053   TRIG 51 09/20/2006 0915   HDL 55 02/09/2018 1053   CHOLHDL 4 09/27/2017 1204   VLDL 26.4 09/27/2017 1204   LDLCALC 108 (H) 02/09/2018 1053   Hepatic Function Panel     Component Value Date/Time   PROT 6.9 02/09/2018 1053   ALBUMIN 4.2 02/09/2018 1053   AST 16 02/09/2018 1053   ALT 11 02/09/2018 1053   ALKPHOS 154 (H) 02/09/2018 1053   BILITOT 0.2 02/09/2018 1053   BILIDIR 0.1 04/22/2014 1254      Component Value Date/Time   TSH 2.020 02/09/2018 1053   TSH 2.39 09/27/2016 1608   TSH 1.74 03/23/2016 0953   Results for SHERLYNN, TOURVILLE D "HOLLIS" (MRN [id number]) as of 04/27/2018 12:13  Ref. Range 02/09/2018 10:53  Vitamin D, 25-Hydroxy Latest Ref Range: 30.0 - 100.0 ng/mL 23.3 (L)   ASSESSMENT AND PLAN: Vitamin D deficiency - Plan: Vitamin D, Ergocalciferol, (DRISDOL) 50000 units CAPS capsule  Class 2 severe obesity  with serious comorbidity and body mass index (BMI) of 37.0 to 37.9 in adult, unspecified obesity type ([place])  PLAN:  Vitamin D Deficiency Erica [name] was informed that low vitamin D levels contributes to fatigue and are associated with obesity, breast, and colon cancer. She agrees to continue to take prescription Vit D _0 ,000 IU every week #4 with no refills and will follow up for routine testing of vitamin D, at least 2-3 times per year. She was informed of the risk of over-replacement of vitamin D and agrees to not  increase her dose unless she discusses this with [place] first. Erica [name] agrees to follow up as directed.  We spent > than 50% of the 30 minute visit on the counseling as documented in the note.  Obesity Erica [name] is currently in the action stage of change. As such, her goal is to continue with weight loss efforts She has agreed to keep a food journal with 800 to 1000 calories and 70 grams of protein  daily Erica [name] has been instructed to work up to a goal of 150 minutes of combined cardio and strengthening exercise per week for weight loss and overall health benefits. We discussed the following Behavioral Modification Strategies today: keep a strict food journal, increasing lean protein intake and work on meal planning and easy cooking plans  Erica [name] has agreed to follow up with our clinic in 2 to 3 weeks. She was informed of the importance of frequent follow up visits to maximize her success with intensive lifestyle modifications for her multiple health conditions.   OBESITY BEHAVIORAL INTERVENTION VISIT  Today's visit was # 5 out of 22.  Starting weight: 241 lbs Starting date: 02/09/18 Today's weight : 231 lbs Today's date: 04/27/2018 Total lbs lost to date: 10    ASK: We discussed the diagnosis of obesity with Erica [name] today and Erica [name] agreed to give [place] permission to discuss obesity behavioral modification therapy today.  ASSESS: Erica [name] has the diagnosis of obesity and her BMI today is 1.3 Erica [name] is in the action stage of change   ADVISE: Erica [name] was educated on the multiple health risks of obesity as well as the benefit of weight loss to improve her health. She was advised of the need for long term treatment and the importance of lifestyle modifications.  AGREE: Multiple dietary modification options and treatment options were discussed and  Erica [name] agreed to the above obesity treatment plan.  I, Doreene Nest, am acting as transcriptionist for Dennard Nip, MD  I have reviewed the above documentation for accuracy and completeness, and I agree with the above. -Dennard Nip, MD

## 2018-05-01 NOTE — Progress Notes (Unsigned)
  Office: [phone]  /  Fax: [phone]  Date: May 15, 2018 Time Seen:*** Duration:*** Provider: Glennie Isle, Psy.D. Type of Session: Individual Therapy   HPI: Elizabethwas referred by Dr. Dennard Nip due to depression with anxiety. Per the note for the office visit with Dr. Leafy Ro on March 20, 2018, "Elizabethis followed by her psychiatrist and is not doing any therapy or counseling. She is overwhelmed with caring for her husband and feels her mood is depressed. Erica [name] struggleswith emotional eating and using food for comfort to the extent that it is negatively impacting herhealth. Sheoften snacks when sheis not hungry. Elizabethsometimes feels sheis out of control and then feels guilty that shemade poor food choices. Erica [name] working on behavior modification techniques to help reduce heremotional eating and has [name] somewhat successful.Sheshows no sign of suicidal or homicidal ideations." Per the note for the initial office visit with Dr. Leafy Ro on Feb 09, 2018, Elizabethhas significant food cravings issues,snacks frequently in the evenings,skips meals frequently,frequently drinking liquids with calories,frequently makes poor food choices,has binge eating behaviors, andstruggles with emotional eating. Per the note for the initial visit with this provider on April 05, 2018, Erica [name] reported she "wasn't eating enough," and noted improvement since starting with the clinic. Erica [name] she engages in emotional eating when experiencing negative feelings (e.g., upset about something).Per Erica [name], shestarted gaining weighttenyears ago andherheaviest weight ever was250pounds.Before she started gaining weight, she [name] she was not eating and lost weight secondary to marital stressors.  Session Content: Session focused on the goals of decreasing emotional eating and increasing coping skills. The session was initiated with the administration of the PHQ-9 and  GAD-7, as well as a a brief check-in.   Erica [name] was receptive to today's session as evidenced by ***.  Mental Status Examination: Erica [name] arrived on time for the appointment. She presented as appropriately dressed and groomed. Erica [name] appeared her stated age and demonstrated adequate orientation to time, place, person, and purpose of the appointment. She also demonstrated appropriate eye contact. No psychomotor abnormalities or behavioral peculiarities noted. Her mood was *** with congruent affect. Her thought processes were logical, linear, and goal-directed. No hallucinations, delusions, bizarre thinking or behavior reported or observed. Judgment, insight, and impulse control appeared to be grossly intact. There was no evidence of paraphasias (i.e., errors in speech, gross mispronunciations, and word substitutions), repetition deficits, or disturbances in volume or prosody (i.e., rhythm and intonation). There was no evidence of attention or memory impairments. Erica [name] denied current suicidal and homicidal ideation, intent or plan.  Interventions: Erica [name] was administered the PHQ-9 and GAD-7 for symptom monitoring. Content from the last session was reviewed (I.e., self-care; the connection between thoughts, feelings, and behaviors; and pleasurable activities). Throughout today's session, empathic reflections and validation were provided. Psychoeducation regarding *** was provided and *** [insert other interventions].   DSM-5 Diagnosis: 296.32 (F33.1) Major Depressive Disorder, Moderate, Recurrent, With Anxious Distress  Plan: Erica [name] continues to appear able and willing to participate as evidenced by engagement in reciprocal conversation, and asking questions for clarification as appropriate.*** The next appointment will be scheduled in ***. The next session will focus on reviewing learned skills, and working towards established treatment goals.

## 2018-05-10 DIAGNOSIS — N760 Acute vaginitis: Secondary | ICD-10-CM | POA: Diagnosis not present

## 2018-05-10 DIAGNOSIS — N952 Postmenopausal atrophic vaginitis: Secondary | ICD-10-CM | POA: Diagnosis not present

## 2018-05-15 ENCOUNTER — Encounter (INDEPENDENT_AMBULATORY_CARE_PROVIDER_SITE_OTHER): Payer: Self-pay

## 2018-05-15 ENCOUNTER — Ambulatory Visit (INDEPENDENT_AMBULATORY_CARE_PROVIDER_SITE_OTHER): Payer: Medicare Other | Admitting: Psychology

## 2018-05-15 ENCOUNTER — Telehealth: Payer: Self-pay | Admitting: Internal Medicine

## 2018-05-15 ENCOUNTER — Ambulatory Visit (INDEPENDENT_AMBULATORY_CARE_PROVIDER_SITE_OTHER): Payer: Self-pay | Admitting: Physician Assistant

## 2018-05-15 MED ORDER — SIMVASTATIN 40 MG PO TABS: 40.0000 mg | ORAL_TABLET | Freq: Every day | ORAL | 1 refills | Status: DC

## 2018-05-15 NOTE — Telephone Encounter (Signed)
Copied from [place] [id number]. Topic: Quick Communication - See Telephone Encounter >> May 15, 2018 11:54 AM Conception Chancy, NT wrote: CRM for notification. See Telephone encounter for: 05/15/18.  Patient is calling and is states that her pharmacy never received her medicine for simvastatin (ZOCOR) 40 MG tablet. Patient states she is out and is requesting a refill.   simvastatin (ZOCOR) 40 MG tablet   [facility] - [place], [place] - [address] [place] [address] Phone: [phone] Fax: [phone]

## 2018-05-30 DIAGNOSIS — M5136 Other intervertebral disc degeneration, lumbar region: Secondary | ICD-10-CM | POA: Diagnosis not present

## 2018-05-30 DIAGNOSIS — M51369 Other intervertebral disc degeneration, lumbar region without mention of lumbar back pain or lower extremity pain: Secondary | ICD-10-CM | POA: Insufficient documentation

## 2018-06-06 NOTE — Progress Notes (Addendum)
Office: [phone]  /  Fax: [phone]   Date: June 07, 2018 Time Seen: 10:00am Duration: 30 minutes Provider: Glennie Isle, Psy.D. Type of Session: Individual Therapy   HPI: Elizabethwas referred by Dr. Dennard Nip due to depression with anxiety. Per the note for the office visit with Dr. Leafy Ro on March 20, 2018, "Elizabethis followed by her psychiatrist and is not doing any therapy or counseling. She is overwhelmed with caring for her husband and feels her mood is depressed. Sarahelizabeth struggleswith emotional eating and using food for comfort to the extent that it is negatively impacting herhealth. Sheoften snacks when sheis not hungry. Elizabethsometimes feels sheis out of control and then feels guilty that shemade poor food choices. Nunzio Cory been working on behavior modification techniques to help reduce heremotional eating and has been somewhat successful.Sheshows no sign of suicidal or homicidal ideations." Per the note for the initial office visit with Dr. Leafy Ro on Feb 09, 2018, Elizabethhas significant food cravings issues,snacks frequently in the evenings,skips meals frequently,frequently drinking liquids with calories,frequently makes poor food choices,has binge eating behaviors, andstruggles with emotional eating. Per the note for the initial visit with this provider on April 05, 2018, Rosalena reported she "wasn't eating enough," and noted improvement since starting with the clinic. Lotoya shared she engages in emotional eating when experiencing negative feelings (e.g., upset about something).Per [name], shestarted gaining weighttenyears ago andherheaviest weight ever was250pounds.Before she started gaining weight, she shared she was not eating and lost weight secondary to marital stressors.  Session Content: Session focused on the following treatment goal: decrease emotional eating and increase coping skills. The session was initiated with the  administration of the PHQ-9 and GAD-7, as well as a brief check-in. Demetress shared issues related to sciatic nerve pain, which resulted in her having to cancel the last appointment with this provider. Regarding pleasurable activities, she discussed watching different TV shows, engaging in yard work, and that she "enjoyed a concert." This provider and Gorgeous discussed the impact of engagement in pleasurable activities on emotional eating. Additionally, Kailin shared worry related to a family reunion this weekend as her mother is sick and she wants her mother to attend. She also discussed a family conflict. Thus, this provider assisted in processing Reva's thoughts and feelings associated with the conflict. Moreover, Zanobia shared her eating patterns have been "pretty good;" however, she acknowledged the family conflict and worry about her mother has impacted her eating. She discussed using My Fitness Pal to journal; however, expressed confusion related to her calorie and protein goals and whether or not she should be following the previously prescribed meal plan. As such, this provider recommended she discuss this during her appointment today with Abby Potash, PA-C.  Ciaira agreed and was receptive to this provider also touching base with Abby Potash, PA-C prior to her appointment. Furthermore, brief psychoeducation regarding triggers for emotional eating was provided. Treniya was provided a handout, and encouraged to utilize the handout between now and the next appointment to increase awareness of triggers and frequency. Sanjana agreed.   Madylin was receptive to today's session as evidenced by her sharing about recent events, discussing engagement in pleasurable activities, and willingness to speak with Abby Potash, PA-C regarding confusion related to her meal plan and journaling goals.   Mental Status Examination: Avin arrived on time for the appointment. She presented as  appropriately dressed and groomed. Denetria appeared her stated age and demonstrated adequate orientation to time, place, person, and purpose of the appointment. She also demonstrated appropriate eye  contact. No psychomotor abnormalities or behavioral peculiarities noted. Her mood was euthymic with congruent affect. Her thought processes were logical, linear, and goal-directed. No hallucinations, delusions, bizarre thinking or behavior reported or observed. Judgment, insight, and impulse control appeared to be grossly intact. There was no evidence of paraphasias (i.e., errors in speech, gross mispronunciations, and word substitutions), repetition deficits, or disturbances in volume or prosody (i.e., rhythm and intonation). There was no evidence of attention or memory impairments. Jackquelyn denied current suicidal and homicidal ideation, intent or plan.  Structured Assessment Results: The Patient Health Questionnaire-9 (PHQ-9) is a self-report measure that assesses symptoms and severity of depression over the course of the last two weeks. Andretta obtained a score of eight suggesting mild depression. Rhyann finds the endorsed symptoms to be somewhat difficult. Depression screen PHQ 2/9 06/07/2018  Decreased Interest 1  Down, Depressed, Hopeless 1  PHQ - 2 Score 2  Altered sleeping 2  Tired, decreased energy 2  Change in appetite 0  Feeling bad or failure about yourself  0  Trouble concentrating 2  Moving slowly or fidgety/restless 0  Suicidal thoughts 0  PHQ-9 Score 8  Difficult doing work/chores -   The Generalized Anxiety Disorder-7 (GAD-7) is a brief self-report measure that assesses symptoms of anxiety over the course of the last two weeks. Kaylena obtained a score of six suggesting mild anxiety. GAD 7 : Generalized Anxiety Score 06/07/2018  Nervous, Anxious, on Edge 1  Control/stop worrying 1  Worry too much - different things 1  Trouble relaxing 1  Restless 0  Easily annoyed or  irritable 1  Afraid - awful might happen 1  Total GAD 7 Score 6  Anxiety Difficulty Somewhat difficult   Interventions: Beverlyn was administered the PHQ-9 and GAD-7 for symptom monitoring. Content from the last session was reviewed Throughout today's session, empathic reflections and validation were provided. This provider assisted Dorothy in processing thoughts and feelings related to her recent family conflict and its subsequent impact on her eating. The impact of pleasurable activities on emotional eating was also reviewed. Brief psychoeducation regarding triggers for emotional eating was provided. Marieme was given a handout.   DSM-5 Diagnosis: 296.32 (F33.1) Major Depressive Disorder, Moderate, Recurrent, With Anxious Distress  Plan: Lorenia continues to appear able and willing to participate as evidenced by engagement in reciprocal conversation, and asking questions for clarification as appropriate.The next appointment will be scheduled in two weeks. The next session will focus on reviewing learned skills, and working towards the established treatment goals. This provider will also speak with Abby Potash, PA-C as noted above prior to [facility] appointment with her today.

## 2018-06-07 ENCOUNTER — Ambulatory Visit (INDEPENDENT_AMBULATORY_CARE_PROVIDER_SITE_OTHER): Payer: Medicare Other | Admitting: Physician Assistant

## 2018-06-07 ENCOUNTER — Ambulatory Visit (INDEPENDENT_AMBULATORY_CARE_PROVIDER_SITE_OTHER): Payer: Medicare Other | Admitting: Psychology

## 2018-06-07 ENCOUNTER — Encounter (INDEPENDENT_AMBULATORY_CARE_PROVIDER_SITE_OTHER): Payer: Self-pay | Admitting: Physician Assistant

## 2018-06-07 VITALS — BP 119/77 | HR 65 | Temp 97.9°F | Ht 66.0 in | Wt 227.0 lb

## 2018-06-07 DIAGNOSIS — Z6836 Body mass index (BMI) 36.0-36.9, adult: Secondary | ICD-10-CM | POA: Diagnosis not present

## 2018-06-07 DIAGNOSIS — F331 Major depressive disorder, recurrent, moderate: Secondary | ICD-10-CM | POA: Diagnosis not present

## 2018-06-07 DIAGNOSIS — E559 Vitamin D deficiency, unspecified: Secondary | ICD-10-CM | POA: Diagnosis not present

## 2018-06-07 DIAGNOSIS — E119 Type 2 diabetes mellitus without complications: Secondary | ICD-10-CM | POA: Diagnosis not present

## 2018-06-07 MED ORDER — VITAMIN D (ERGOCALCIFEROL) 1.25 MG (50000 UNIT) PO CAPS
50000.0000 [IU] | ORAL_CAPSULE | ORAL | 0 refills | Status: DC
Start: 2018-06-07 — End: 2018-07-05

## 2018-06-07 NOTE — Progress Notes (Signed)
Office: [phone]  /  Fax: [phone]   HPI:   Chief Complaint: OBESITY Erica [name] is here to discuss her progress with her obesity treatment plan. She is keeping a food journal with 800 to 1000 calories and 70 grams of protein and is following her eating plan approximately 85 % of the time. She states she is exercising 0 minutes 0 times per week. Erica [name] did very well with weight loss. She has been journaling and enjoys keeping track of her food. Her weight is 227 lb (103 kg) today and has had a weight loss of 4 pounds over a period of 6 weeks since her last visit. She has lost 14 lbs since starting treatment with [place].  Vitamin D deficiency Erica [name] has a diagnosis of vitamin D deficiency. She is currently taking vit D and denies nausea, vomiting or muscle weakness.  Diabetes II Erica [name] has a diagnosis of diabetes type II. Erica [name] is taking metformin and denies any hypoglycemic episodes. She denies nausea, vomiting, and diarrhea. Last A1c was 6.2. She has been working on intensive lifestyle modifications including diet, exercise, and weight loss to help control her blood glucose levels.   ALLERGIES: Allergies  Allergen Reactions  . Codeine Nausea And Vomiting  . Guaifenesin-Codeine Other (See Comments)    REACTION: feels spacey  . Wellbutrin [Bupropion] Palpitations    hallucinations    MEDICATIONS: Current Outpatient Medications on File Prior to Visit  Medication Sig Dispense Refill  . albuterol (PROVENTIL HFA;VENTOLIN HFA) 108 (90 Base) MCG/ACT inhaler Inhale 2 puffs into the lungs every 6 (six) hours as needed for wheezing or shortness of breath. 1 Inhaler 11  . ALPRAZolam (XANAX) 0.5 MG tablet Take 1 tablet (0.5 mg total) by mouth 2 (two) times daily as needed for anxiety. 60 tablet 1  . atenolol (TENORMIN) 25 MG tablet Take 69m (1 tablet) in the AM and 554m(2 tablets) in the PM 180 tablet 3  . baclofen (LIORESAL) 10 MG tablet baclofen 10 mg tablet  Take 1 tablet  3 times a day by oral route.    . blood glucose meter kit and supplies KIT Dispense based on patient and insurance preference. Use up to four times daily as directed. (FOR ICD-9 250.00, 250.01). 1 each 0  . ciclopirox (PENLAC) 8 % solution Apply topically at bedtime. Apply over nail & surrounding skin. Apply QD over previous coat.After7 days, may remove w alcohol, repeat 6.6 mL 3  . fluticasone (FLONASE) 50 MCG/ACT nasal spray Place 2 sprays into both nostrils daily. 48 g 11  . furosemide (LASIX) 40 MG tablet Take 1 tablet (40 mg total) by mouth 2 (two) times daily. 180 tablet 0  . hydrocortisone (ANUSOL-HC) 2.5 % rectal cream Place rectally as needed. 30 g 3  . levocetirizine (XYZAL) 5 MG tablet Take 1 tablet (5 mg total) by mouth every evening. -- Office visit needed for further refills 90 tablet 0  . linaclotide (LINZESS) 145 MCG CAPS capsule Take 1 capsule (145 mcg total) by mouth daily. 90 capsule 1  . meclizine (ANTIVERT) 25 MG tablet Take 1 tablet (25 mg total) by mouth 3 (three) times daily as needed for dizziness or nausea. 180 tablet 3  . metFORMIN (GLUCOPHAGE) 500 MG tablet Take 1 tablet (500 mg total) by mouth daily with breakfast. -- Office visit needed for further refills 90 tablet 0  . Multiple Vitamins-Minerals (MULTIVITAMIN WITH MINERALS) tablet Take 1 tablet by mouth daily.    . naproxen (NAPROSYN) 500 MG tablet Take  1 tablet (500 mg total) by mouth 2 (two) times daily with a meal. 60 tablet 0  . omeprazole (PRILOSEC) 20 MG capsule Take 1 capsule (20 mg total) by mouth daily. 90 capsule 1  . potassium chloride SA (K-DUR,KLOR-CON) 20 MEQ tablet Take 1 tablet (20 mEq total) by mouth 2 (two) times daily. 180 tablet 1  . simvastatin (ZOCOR) 40 MG tablet Take 1 tablet (40 mg total) by mouth daily at 6 PM. 90 tablet 1  . SUMAtriptan-naproxen (TREXIMET) 85-500 MG tablet Take 1 tablet by mouth every 2 (two) hours as needed for migraine. 10 tablet 8  . promethazine (PHENERGAN) 25 MG tablet  Take 1 tablet (25 mg total) by mouth every 8 (eight) hours as needed for up to 7 days for nausea or vomiting (related to dizziness). 30 tablet 2  . [DISCONTINUED] oxycodone (OXY-IR) 5 MG capsule Take 5 mg by mouth every 4 (four) hours as needed. For pain.     No current facility-administered medications on file prior to visit.     PAST MEDICAL HISTORY: Past Medical History:  Diagnosis Date  . Abnormal stress test    a. 09/2016: NST showed a small defect of mild severity present in the basal inferoseptal and mid inferoseptal location, consistent with ischemia. --> medically managed  . ALLERGIC RHINITIS   . Anxiety   . Back pain   . Bursitis   . Constipation   . DDD (degenerative disc disease), lumbar    ESI with [facility] (spring 2016)  . Depression   . Diabetes mellitus without complication ([place])   . Dry mouth   . Dyslipidemia   . Easy bruising   . External hemorrhoids   . Fatigue   . GERD (gastroesophageal reflux disease)   . Hay fever   . Hypertension   . Knee pain   . Leg cramping   . Muscle stiffness   . Nervousness   . Obesity   . Osteoarthritis   . Palpitations    a. prior event monitor showing sinus tachycardia, no PAF.   Marland Kitchen Panic attacks    Hx of depression  . Renal disorder   . Sinus pain   . Sleep apnea    CPAP hs  . Stress   . Trouble in sleeping     PAST SURGICAL HISTORY: Past Surgical History:  Procedure Laterality Date  . ABDOMINAL HYSTERECTOMY    . MASS EXCISION Left 12/30/2015   Procedure: EXCISION LEFT LEG MASS;  Surgeon: Donnie Mesa, MD;  Location: [facility];  Service: General;  Laterality: Left;  . REPLACEMENT TOTAL KNEE Right   . RIGHT OOPHORECTOMY     No cancer    SOCIAL HISTORY: Social History   Tobacco Use  . Smoking status: Never Smoker  . Smokeless tobacco: Never Used  Substance Use Topics  . Alcohol use: Yes    Alcohol/week: 1.0 standard drinks    Types: 1 Glasses of wine per week    Comment: one drink a month    . Drug use: No    FAMILY HISTORY: Family History  Problem Relation Age of Onset  . Heart disease Mother        Enlarged Heart, Pacemaker  . Diabetes Mother   . Thyroid disease Mother   . Hyperlipidemia Mother   . Hypertension Mother   . Sleep apnea Mother   . Dementia Father   . Kidney failure Father   . Prostate cancer Father   . Alcoholism Father   .  Asthma Other   . Allergies Sister   . Asthma Son   . Breast cancer Maternal Aunt        cousin  . Colon cancer Cousin   . Heart disease Son        CHF, morbid obesity  . Prostate cancer Maternal Uncle   . Diabetes Son     ROS: Review of Systems  Constitutional: Positive for weight loss.  Gastrointestinal: Negative for diarrhea, nausea and vomiting.  Musculoskeletal:       Negative for muscle weakness.  Endo/Heme/Allergies:       Negative for hypoglycemia.    PHYSICAL EXAM: Blood pressure 119/77, pulse 65, temperature 97.9 F (36.6 C), temperature source Oral, height _0  (1.676 m), weight 227 lb (103 kg), SpO2 98 %. Body mass index is 36.64 kg/m. Physical Exam  Constitutional: She is oriented to person, place, and time. She appears well-developed and well-nourished.  Cardiovascular: Normal rate.  Pulmonary/Chest: Effort normal.  Musculoskeletal: Normal range of motion.  Neurological: She is oriented to person, place, and time.  Skin: Skin is warm and dry.  Psychiatric: She has a normal mood and affect. Her behavior is normal.  Vitals reviewed.   RECENT LABS AND TESTS: BMET    Component Value Date/Time   NA 143 02/09/2018 1053   K 4.2 02/09/2018 1053   CL 105 02/09/2018 1053   CO2 24 02/09/2018 1053   GLUCOSE 111 (H) 02/09/2018 1053   GLUCOSE 96 09/27/2017 1204   GLUCOSE 89 09/20/2006 0915   BUN 17 02/09/2018 1053   CREATININE 0.75 02/09/2018 1053   CALCIUM 9.8 02/09/2018 1053   GFRNONAA 85 02/09/2018 1053   GFRAA 97 02/09/2018 1053   Lab Results  Component Value Date   HGBA1C 6.2 (H)  02/09/2018   HGBA1C 6.4 09/27/2017   HGBA1C 6.4 03/28/2017   HGBA1C 6.2 09/27/2016   HGBA1C 6.3 03/23/2016   Lab Results  Component Value Date   INSULIN 23.1 02/09/2018   CBC    Component Value Date/Time   WBC 6.6 02/09/2018 1053   WBC 7.2 05/07/2016 1229   RBC 4.46 02/09/2018 1053   RBC 4.45 05/07/2016 1229   HGB 12.4 02/09/2018 1053   HCT 38.6 02/09/2018 1053   PLT 249.0 05/07/2016 1229   MCV 87 02/09/2018 1053   MCH 27.8 02/09/2018 1053   MCHC 32.1 02/09/2018 1053   MCHC 33.1 05/07/2016 1229   RDW 15.4 02/09/2018 1053   LYMPHSABS 1.5 02/09/2018 1053   MONOABS 0.5 05/07/2016 1229   EOSABS 0.1 02/09/2018 1053   BASOSABS 0.0 02/09/2018 1053   Iron/TIBC/Ferritin/ %Sat No results found for: IRON, TIBC, FERRITIN, IRONPCTSAT Lipid Panel     Component Value Date/Time   CHOL 183 02/09/2018 1053   TRIG 99 02/09/2018 1053   TRIG 51 09/20/2006 0915   HDL 55 02/09/2018 1053   CHOLHDL 4 09/27/2017 1204   VLDL 26.4 09/27/2017 1204   LDLCALC 108 (H) 02/09/2018 1053   Hepatic Function Panel     Component Value Date/Time   PROT 6.9 02/09/2018 1053   ALBUMIN 4.2 02/09/2018 1053   AST 16 02/09/2018 1053   ALT 11 02/09/2018 1053   ALKPHOS 154 (H) 02/09/2018 1053   BILITOT 0.2 02/09/2018 1053   BILIDIR 0.1 04/22/2014 1254      Component Value Date/Time   TSH 2.020 02/09/2018 1053   TSH 2.39 09/27/2016 1608   TSH 1.74 03/23/2016 0953   Results for ELECIA, SERAFIN D "HOLLIS" (MRN [id number]) as  of 06/07/2018 17:50  Ref. Range 02/09/2018 10:53  Vitamin D, 25-Hydroxy Latest Ref Range: 30.0 - 100.0 ng/mL 23.3 (L)   ASSESSMENT AND PLAN: Vitamin D deficiency - Plan: Vitamin D, Ergocalciferol, (DRISDOL) 50000 units CAPS capsule  Type 2 diabetes mellitus without complication, without long-term current use of insulin (HCC)  Class 2 severe obesity with serious comorbidity and body mass index (BMI) of 36.0 to 36.9 in adult, unspecified obesity type ([place])  PLAN:  Vitamin D  Deficiency Zlaty was informed that low vitamin D levels contributes to fatigue and are associated with obesity, breast, and colon cancer. She agrees to continue to take prescription Vit D _0 ,000 IU every week #4 with no refills and will follow up for routine testing of vitamin D, at least 2-3 times per year. She was informed of the risk of over-replacement of vitamin D and agrees to not increase her dose unless she discusses this with [place] first. Ashlinn agrees to follow up in 3 weeks and we will check labs then.  Diabetes II Erica [name] has been given extensive diabetes education by myself today including ideal fasting and post-prandial blood glucose readings, individual ideal Hgb A1c goals  and hypoglycemia prevention. We discussed the importance of good blood sugar control to decrease the likelihood of diabetic complications such as nephropathy, neuropathy, limb loss, blindness, coronary artery disease, and death. We discussed the importance of intensive lifestyle modification including diet, exercise and weight loss as the first line treatment for diabetes. Naysa agrees to continue metformin, diet, and weight loss. We will check labs at her next visit in 3 weeks.  Obesity Erica [name] is currently in the action stage of change. As such, her goal is to continue with weight loss efforts. She has agreed to keep a food journal with 800 to 1000 calories and 70 grams of protein. Erica [name] has been instructed to work up to a goal of 150 minutes of combined cardio and strengthening exercise per week for weight loss and overall health benefits. We discussed the following Behavioral Modification Strategies today: work on meal planning and easy cooking plans and planning for success. Erica [name] was given recipes, eating out, and protein paperwork today.  Erica [name] has agreed to follow up with our clinic in 3 weeks. She was informed of the importance of frequent follow up visits to maximize her success with  intensive lifestyle modifications for her multiple health conditions.   OBESITY BEHAVIORAL INTERVENTION VISIT  Today's visit was # 6   Starting weight: 241 lbs Starting date: 02/09/18 Today's weight :  227 lb (103 kg)  Today's date: 06/07/2018 Total lbs lost to date: 14 At least 15 minutes were spent on discussing the following behavioral intervention visit.   ASK: We discussed the diagnosis of obesity with Erica [name] today and Geraldina agreed to give [place] permission to discuss obesity behavioral modification therapy today.  ASSESS: Erica [name] has the diagnosis of obesity and her BMI today is 36.66. Sherrilyn is in the action stage of change.   ADVISE: Erica [name] was educated on the multiple health risks of obesity as well as the benefit of weight loss to improve her health. She was advised of the need for long term treatment and the importance of lifestyle modifications to improve her current health and to decrease her risk of future health problems.  AGREE: Multiple dietary modification options and treatment options were discussed and  Erica [name] agreed to follow the recommendations documented in the above note.  ARRANGE: Erica [name] was educated on the importance of  frequent visits to treat obesity as outlined per CMS and USPSTF guidelines and agreed to schedule her next follow up appointment today.  Lenward Chancellor, am acting as transcriptionist for Abby Potash, PA-C I, Abby Potash, PA-C have reviewed above note and agree with its content

## 2018-06-22 DIAGNOSIS — F410 Panic disorder [episodic paroxysmal anxiety] without agoraphobia: Secondary | ICD-10-CM | POA: Diagnosis not present

## 2018-06-22 DIAGNOSIS — F411 Generalized anxiety disorder: Secondary | ICD-10-CM | POA: Diagnosis not present

## 2018-06-22 DIAGNOSIS — F331 Major depressive disorder, recurrent, moderate: Secondary | ICD-10-CM | POA: Diagnosis not present

## 2018-06-22 NOTE — Progress Notes (Unsigned)
Office: [phone]  /  Fax: [phone]   Date: June 28, 2018 Time Seen:*** Duration:*** Provider: Glennie Isle, Psy.D. Type of Session: Individual Therapy   HPI: Erica [name] was referred by Dr. Dennard Nip due to depression with anxiety and was seen for initial appointment with this provider on June 05, 2018. Per the note for the office visit with Dr. Leafy Ro on March 20, 2018, "Erica [name] is followed by her psychiatrist and is not doing any therapy or counseling. She is overwhelmed with caring for her husband and feels her mood is depressed. Erica [name] struggles with emotional eating and using food for comfort to the extent that it is negatively impacting her health. She often snacks when she is not hungry. Erica [name] sometimes feels she is out of control and then feels guilty that she made poor food choices. She has been working on behavior modification techniques to help reduce her emotional eating and has been somewhat successful. She shows no sign of suicidal or homicidal ideations." Per the note for the initial office visit with Dr. Leafy Ro on Feb 09, 2018, Erica [name] has significant food cravings issues, snacks frequently in the evenings, skips meals frequently, frequently drinking liquids with calories, frequently makes poor food choices, has binge eating behaviors, and struggles with emotional eating.  In addition, during that visit Erica [name] shared she started gaining weight 10 years ago and her heaviest weight ever was 250 pounds. During the initial appointment with this provider, Erica [name] reported she "wasn't eating enough," and noted improvement since starting with the clinic. Erica [name] shared she engages in emotional eating when experiencing negative feelings (e.g., upset about something).  She also explained that before she started gaining weight, she she was not eating and lost weight secondary to marital stressors  Session Content: Session focused on the following treatment  goals: decrease emotional eating and increase coping skills. The session was initiated with the administration of the PHQ-9 and GAD-7, as well as a brief check-in. Information concerning the practice, financial arrangements, and confidentiality and patients' rights were discussed during the initial appointment; however, per this provider's new office policy, Ivelisse was asked to sign a service agreement related to the aforementioned. The agreement was reviewed with, and signed by, Erica [name].     Erica [name] was receptive to today's session as evidenced by ***.  Mental Status Examination: Erica [name] arrived on time for the appointment. She presented as appropriately dressed and groomed. Erica [name] appeared her stated age and demonstrated adequate orientation to time, place, person, and purpose of the appointment. She also demonstrated appropriate eye contact. No psychomotor abnormalities or behavioral peculiarities noted. Her mood was {gbmood:21757} with congruent affect. Her thought processes were logical, linear, and goal-directed. No hallucinations, delusions, bizarre thinking or behavior reported or observed. Judgment, insight, and impulse control appeared to be grossly intact. There was no evidence of paraphasias (i.e., errors in speech, gross mispronunciations, and word substitutions), repetition deficits, or disturbances in volume or prosody (i.e., rhythm and intonation). There was no evidence of attention or memory impairments. Erica [name] denied current suicidal and homicidal ideation, intent or plan.  Structured Assessment Results: The Patient Health Questionnaire-9 (PHQ-9) is a self-report measure that assesses symptoms and severity of depression over the course of the last two weeks. Erica [name] obtained a score of *** suggesting {GBPHQ9SEVERITY:21752}. Erica [name] finds the endorsed symptoms to be {gbphq9difficulty:21754}.  The Generalized Anxiety Disorder-7 (GAD-7) is a brief self-report measure that  assesses symptoms of anxiety over the course of the last two weeks. Erica [name] obtained a score of *** suggesting {gbgad7severity:21753}.  Interventions: Erica [name] was administered the PHQ-9 and GAD-7 for symptom monitoring. Content from the last session was reviewed. Throughout today's session, empathic reflections and validation were provided. Psychoeducation regarding *** was provided and *** [insert other interventions].   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 Erica [name] has demonstrated progress in her goal of *** as evidenced by ***  Plan: Erica [name] continues to appear able and willing to participate as evidenced by engagement in reciprocal conversation, and asking questions for clarification as appropriate.*** The next appointment will be scheduled in {gbweeks:21758}. The next session will focus on reviewing learned skills, and working towards the established treatment goal.***

## 2018-06-28 ENCOUNTER — Ambulatory Visit (INDEPENDENT_AMBULATORY_CARE_PROVIDER_SITE_OTHER): Payer: Self-pay | Admitting: Psychology

## 2018-06-28 ENCOUNTER — Ambulatory Visit (INDEPENDENT_AMBULATORY_CARE_PROVIDER_SITE_OTHER): Payer: Self-pay | Admitting: Physician Assistant

## 2018-07-04 NOTE — Progress Notes (Signed)
Office: [phone]  /  Fax: [phone]   Date: July 05, 2018  Time Seen: 9:00am Duration: 30 minutes Provider: Glennie Isle, Psy.D. Type of Session: Individual Therapy   HPI: Erica [name] was referred by Dr. Dennard [name] due to depression with anxiety and was seen for an initial appointment with this provider on April 05, 2018. Per the note for the office visit with Dr. Leafy [name] on March 20, 2018, "Elizabethis followed by her psychiatrist and is not doing any therapy or counseling. She is overwhelmed with caring for her husband and feels her mood is depressed. Erica [name] struggleswith emotional eating and using food for comfort to the extent that it is negatively impacting herhealth. Sheoften snacks when sheis not hungry. Elizabethsometimes feels sheis out of control and then feels guilty that shemade poor food choices. Erica [name] been working on behavior modification techniques to help reduce heremotional eating and has been somewhat successful.Sheshows no sign of suicidal or homicidal ideations." Per the note for the initial office visit with Dr. Leafy [name] on Feb 09, 2018, Elizabethhas significant food cravings issues, snacks frequently in the evenings, skips meals frequently, frequently drinking liquids with calories,frequently makes poor food choices,has binge eating behaviors, and struggles with emotional eating. During the initial appointment with this provider, Erica [name] reported she "wasn't eating enough," and noted improvement since starting with the clinic. Erica [name] shared she engages in emotional eating when experiencing negative feelings (e.g., upset about something). Moreover, shestarted gaining weight ten years ago and herheaviest weight ever was 250pounds. Before she started gaining weight, she shared she was not eating and lost weight secondary to marital stressors.   Session Content: The session was initiated with the administration of the PHQ-9 and GAD-7, as well as a  brief check-in. Information concerning the practice, financial arrangements, and confidentiality and patients' rights were discussed during the initial appointment; however, per this provider's new office policy, Erica [name] was asked to sign a service agreement related to the aforementioned. The agreement was reviewed with, and signed by, Erica [name]. Erica [name] shared about her mother and a close friend passing away and the support she received. This provider processed thoughts and feelings related to her recent losses. She discussed feeling a "void" currently. Erica [name] described an increase in emotional eating as she was "picking up whatever to eat." She felt she was "giving into addiction." Erica [name] noted she is specifically eating sweets. She added, "I don't want to gain no weight." Erica [name] of session focused on treatment moving forward as sessions with this provider have not been consistent due to Erica [name] ongoing stressors. This provider discussed a referral for longer-term therapy that would also focus on grief.  Erica [name] was receptive to today's session as evidenced by her openness to sharing, responsiveness to feedback, and openness to a referral for longer-term therapeutic services.  Session concluded with Erica [name] sharing that she will be going on a cruise on July 22, 2018.  Mental Status Examination: Erica [name] arrived early for the appointment. She presented as appropriately dressed and groomed. Erica [name] appeared her stated age and demonstrated adequate orientation to time, place, person, and purpose of the appointment. She also demonstrated appropriate eye contact. No psychomotor abnormalities or behavioral peculiarities noted. Her mood was sad with congruent affect. Her thought processes were logical, linear, and goal-directed. No hallucinations, delusions, bizarre thinking or behavior reported or observed. Judgment, insight, and impulse control appeared to be grossly intact. There was no  evidence of paraphasias (i.e., errors in speech, gross mispronunciations, and word substitutions), repetition deficits, or disturbances in volume or prosody (  i.e., rhythm and intonation). There was no evidence of attention or memory impairments. Erica [name] denied current suicidal and homicidal ideation, intent or plan.  Structured Assessment Results: The Patient Health Questionnaire-9 (PHQ-9) is a self-report measure that assesses symptoms and severity of depression over the course of the last two weeks. Erica [name] obtained a score of 13 suggesting moderate depression. Erica [name] finds the endorsed symptoms to be not difficult at all.  Depression screen PHQ 2/9 07/05/2018  Decreased Interest 1  Down, Depressed, Hopeless 1  PHQ - 2 Score 2  Altered sleeping 2  Tired, decreased energy 3  Change in appetite 2  Feeling bad or failure about yourself  0  Trouble concentrating 2  Moving slowly or fidgety/restless 2  Suicidal thoughts 0  PHQ-9 Score 13  Difficult doing work/chores -   The Generalized Anxiety Disorder-7 (GAD-7) is a brief self-report measure that assesses symptoms of anxiety over the course of the last two weeks. Erica [name] obtained a score of six suggesting mild anxiety. GAD 7 : Generalized Anxiety Score 07/05/2018  Nervous, Anxious, on Edge 1  Control/stop worrying 1  Worry too much - different things 2  Trouble relaxing 1  Restless 1  Easily annoyed or irritable 0  Afraid - awful might happen 0  Total GAD 7 Score 6  Anxiety Difficulty Not difficult at all   Interventions: Erica [name] was administered the PHQ-9 and GAD-7 for symptom monitoring. Throughout today's session, empathic reflections and validation were provided.  This provider assisted Erica [name] in processing thoughts and feelings related to her recent losses. This provider also discussed options for therapeutic services.  DSM-5 Diagnosis: 296.32 (F33.1) Major Depressive Disorder, Moderate, Recurrent, With Anxious  Distress  Treatment Goal & Progress: Erica [name] was seen for an initial appointment with this provider on April 05, 2018 during which the following treatment goals were established: decrease emotional eating and increase coping skills. Erica [name] has demonstrated progress in her goal of decreasing emotional eating as evidenced by her increased awareness of hunger patterns and triggers for emotional eating. She also demonstrated some progress in her goal of increasing coping skills as evidenced by her reporting engagement in pleasurable activities.  Plan: Erica [name] will be referred for longer term therapeutic services that will focus on grief therapy. This provider explained to Erica [name] that should she continue to experience emotional eating after receiving grief therapy and she is still established with this clinic, she can request to reinitiate services with this provider. Erica [name] verbally acknowledged understanding. In addition, Erica [name] requested that this provider indicate her home telephone number in the referral as she does not have a cell phone at this time.

## 2018-07-05 ENCOUNTER — Encounter (INDEPENDENT_AMBULATORY_CARE_PROVIDER_SITE_OTHER): Payer: Self-pay | Admitting: Bariatrics

## 2018-07-05 ENCOUNTER — Ambulatory Visit (INDEPENDENT_AMBULATORY_CARE_PROVIDER_SITE_OTHER): Payer: Medicare Other | Admitting: Psychology

## 2018-07-05 ENCOUNTER — Ambulatory Visit (INDEPENDENT_AMBULATORY_CARE_PROVIDER_SITE_OTHER): Payer: Medicare Other | Admitting: Bariatrics

## 2018-07-05 VITALS — BP 121/83 | HR 67 | Temp 97.9°F | Ht 66.0 in | Wt 226.0 lb

## 2018-07-05 DIAGNOSIS — E559 Vitamin D deficiency, unspecified: Secondary | ICD-10-CM

## 2018-07-05 DIAGNOSIS — F331 Major depressive disorder, recurrent, moderate: Secondary | ICD-10-CM | POA: Diagnosis not present

## 2018-07-05 DIAGNOSIS — Z6836 Body mass index (BMI) 36.0-36.9, adult: Secondary | ICD-10-CM | POA: Diagnosis not present

## 2018-07-05 DIAGNOSIS — E119 Type 2 diabetes mellitus without complications: Secondary | ICD-10-CM | POA: Diagnosis not present

## 2018-07-05 MED ORDER — VITAMIN D (ERGOCALCIFEROL) 1.25 MG (50000 UNIT) PO CAPS: 50000.0000 [IU] | ORAL_CAPSULE | ORAL | 0 refills | Status: DC

## 2018-07-05 NOTE — Addendum Note (Signed)
Addended by: Nash Dimmer on: 07/05/2018 12:20 PM   Modules accepted: Orders

## 2018-07-06 LAB — COMPREHENSIVE METABOLIC PANEL
ALT: 9 IU/L (ref 0–32)
AST: 9 IU/L (ref 0–40)
Albumin/Globulin Ratio: 1.8 (ref 1.2–2.2)
Albumin: 4.5 g/dL (ref 3.6–4.8)
Alkaline Phosphatase: 149 IU/L — ABNORMAL HIGH (ref 39–117)
BUN/Creatinine Ratio: 22 (ref 12–28)
BUN: 16 mg/dL (ref 8–27)
Bilirubin Total: 0.2 mg/dL (ref 0.0–1.2)
CO2: 24 mmol/L (ref 20–29)
Calcium: 9.7 mg/dL (ref 8.7–10.3)
Chloride: 101 mmol/L (ref 96–106)
Creatinine, Ser: 0.72 mg/dL (ref 0.57–1.00)
GFR calc Af Amer: 102 mL/min/{1.73_m2} (ref >59)
GFR calc non Af Amer: 89 mL/min/{1.73_m2} (ref >59)
Globulin, Total: 2.5 g/dL (ref 1.5–4.5)
Glucose: 97 mg/dL (ref 65–99)
Potassium: 4.2 mmol/L (ref 3.5–5.2)
Sodium: 141 mmol/L (ref 134–144)
Total Protein: 7 g/dL (ref 6.0–8.5)

## 2018-07-06 LAB — INSULIN, RANDOM: INSULIN: 15.7 u[IU]/mL (ref 2.6–24.9)

## 2018-07-06 LAB — HEMOGLOBIN A1C
Est. average glucose Bld gHb Est-mCnc: 131 mg/dL
Hgb A1c MFr Bld: 6.2 % — ABNORMAL HIGH (ref 4.8–5.6)

## 2018-07-06 LAB — VITAMIN D 25 HYDROXY (VIT D DEFICIENCY, FRACTURES): Vit D, 25-Hydroxy: 46 ng/mL (ref 30.0–100.0)

## 2018-07-06 NOTE — Progress Notes (Signed)
Office: [phone]  /  Fax: [phone]   HPI:   Chief Complaint: OBESITY Erica [name] is here to discuss her progress with her obesity treatment plan. She is on the  keep a food journal with [phone] calories and 70g protein  and is following her eating plan approximately 75 % of the time. She states she is exercising 0 minutes 0 times per week. Erica [name] is currently struggling with increased stress and changes in schedule. She has been without her phone since she lost it.  Her weight is 226 lb (102.5 kg) today and has had a weight loss of 1 pounds over a period of 2 weeks since her last visit. She has lost 15 lbs since starting treatment with [place].  Vitamin D deficiency Erica [name] has a diagnosis of vitamin D deficiency. She is currently taking vit D and denies nausea, vomiting or muscle weakness.   Ref. Range 07/05/2018 12:00  Vitamin D, 25-Hydroxy Latest Ref Range: 30.0 - 100.0 ng/mL 46.0   Diabetes II Erica [name] has a diagnosis of diabetes type II. Erica [name] states fasting BGs range between 120 and 130s and denies any hypoglycemic episodes. Last A1c was 6.2.  She has been working on intensive lifestyle modifications including diet, exercise, and weight loss to help control her blood glucose levels.  ALLERGIES: Allergies  Allergen Reactions  . Codeine Nausea And Vomiting  . Guaifenesin-Codeine Other (See Comments)    REACTION: feels spacey  . Wellbutrin [Bupropion] Palpitations    hallucinations    MEDICATIONS: Current Outpatient Medications on File Prior to Visit  Medication Sig Dispense Refill  . albuterol (PROVENTIL HFA;VENTOLIN HFA) 108 (90 Base) MCG/ACT inhaler Inhale 2 puffs into the lungs every 6 (six) hours as needed for wheezing or shortness of breath. 1 Inhaler 11  . ALPRAZolam (XANAX) 0.5 MG tablet Take 1 tablet (0.5 mg total) by mouth 2 (two) times daily as needed for anxiety. 60 tablet 1  . atenolol (TENORMIN) 25 MG tablet Take 69m (1 tablet) in the AM and 533m(2  tablets) in the PM 180 tablet 3  . baclofen (LIORESAL) 10 MG tablet baclofen 10 mg tablet  Take 1 tablet 3 times a day by oral route.    . blood glucose meter kit and supplies KIT Dispense based on patient and insurance preference. Use up to four times daily as directed. (FOR ICD-9 250.00, 250.01). 1 each 0  . ciclopirox (PENLAC) 8 % solution Apply topically at bedtime. Apply over nail & surrounding skin. Apply QD over previous coat.After7 days, may remove w alcohol, repeat 6.6 mL 3  . fluticasone (FLONASE) 50 MCG/ACT nasal spray Place 2 sprays into both nostrils daily. 48 g 11  . furosemide (LASIX) 40 MG tablet Take 1 tablet (40 mg total) by mouth 2 (two) times daily. 180 tablet 0  . hydrocortisone (ANUSOL-HC) 2.5 % rectal cream Place rectally as needed. 30 g 3  . levocetirizine (XYZAL) 5 MG tablet Take 1 tablet (5 mg total) by mouth every evening. -- Office visit needed for further refills 90 tablet 0  . linaclotide (LINZESS) 145 MCG CAPS capsule Take 1 capsule (145 mcg total) by mouth daily. 90 capsule 1  . meclizine (ANTIVERT) 25 MG tablet Take 1 tablet (25 mg total) by mouth 3 (three) times daily as needed for dizziness or nausea. 180 tablet 3  . metFORMIN (GLUCOPHAGE) 500 MG tablet Take 1 tablet (500 mg total) by mouth daily with breakfast. -- Office visit needed for further refills 90 tablet 0  .  Multiple Vitamins-Minerals (MULTIVITAMIN WITH MINERALS) tablet Take 1 tablet by mouth daily.    Marland Kitchen omeprazole (PRILOSEC) 20 MG capsule Take 1 capsule (20 mg total) by mouth daily. 90 capsule 1  . potassium chloride SA (K-DUR,KLOR-CON) 20 MEQ tablet Take 1 tablet (20 mEq total) by mouth 2 (two) times daily. 180 tablet 1  . simvastatin (ZOCOR) 40 MG tablet Take 1 tablet (40 mg total) by mouth daily at 6 PM. 90 tablet 1  . SUMAtriptan-naproxen (TREXIMET) 85-500 MG tablet Take 1 tablet by mouth every 2 (two) hours as needed for migraine. 10 tablet 8  . promethazine (PHENERGAN) 25 MG tablet Take 1 tablet  (25 mg total) by mouth every 8 (eight) hours as needed for up to 7 days for nausea or vomiting (related to dizziness). 30 tablet 2  . [DISCONTINUED] oxycodone (OXY-IR) 5 MG capsule Take 5 mg by mouth every 4 (four) hours as needed. For pain.     No current facility-administered medications on file prior to visit.     PAST MEDICAL HISTORY: Past Medical History:  Diagnosis Date  . Abnormal stress test    a. 09/2016: NST showed a small defect of mild severity present in the basal inferoseptal and mid inferoseptal location, consistent with ischemia. --> medically managed  . ALLERGIC RHINITIS   . Anxiety   . Back pain   . Bursitis   . Constipation   . DDD (degenerative disc disease), lumbar    ESI with [facility] (spring 2016)  . Depression   . Diabetes mellitus without complication ([place])   . Dry mouth   . Dyslipidemia   . Easy bruising   . External hemorrhoids   . Fatigue   . GERD (gastroesophageal reflux disease)   . Hay fever   . Hypertension   . Knee pain   . Leg cramping   . Muscle stiffness   . Nervousness   . Obesity   . Osteoarthritis   . Palpitations    a. prior event monitor showing sinus tachycardia, no PAF.   Marland Kitchen Panic attacks    Hx of depression  . Renal disorder   . Sinus pain   . Sleep apnea    CPAP hs  . Stress   . Trouble in sleeping     PAST SURGICAL HISTORY: Past Surgical History:  Procedure Laterality Date  . ABDOMINAL HYSTERECTOMY    . MASS EXCISION Left 12/30/2015   Procedure: EXCISION LEFT LEG MASS;  Surgeon: Erica [name] Mesa, MD;  Location: [facility];  Service: General;  Laterality: Left;  . REPLACEMENT TOTAL KNEE Right   . RIGHT OOPHORECTOMY     No cancer    SOCIAL HISTORY: Social History   Tobacco Use  . Smoking status: Never Smoker  . Smokeless tobacco: Never Used  Substance Use Topics  . Alcohol use: Yes    Alcohol/week: 1.0 standard drinks    Types: 1 Glasses of wine per week    Comment: one drink a month  . Drug use: No     FAMILY HISTORY: Family History  Problem Relation Age of Onset  . Heart disease Mother        Enlarged Heart, Pacemaker  . Diabetes Mother   . Thyroid disease Mother   . Hyperlipidemia Mother   . Hypertension Mother   . Sleep apnea Mother   . Dementia Father   . Kidney failure Father   . Prostate cancer Father   . Alcoholism Father   . Asthma Other   .  Allergies Sister   . Asthma Son   . Breast cancer Maternal Aunt        cousin  . Colon cancer Cousin   . Heart disease Son        CHF, morbid obesity  . Prostate cancer Maternal Uncle   . Diabetes Son     ROS: Review of Systems  Constitutional: Positive for weight loss.  Gastrointestinal: Negative for nausea and vomiting.  Musculoskeletal:       Negative for muscle weakness  Endo/Heme/Allergies:       Negative for hypoglycemia     PHYSICAL EXAM: Blood pressure 121/83, pulse 67, temperature 97.9 F (36.6 C), temperature source Oral, height 5' 6"  (1.676 m), weight 226 lb (102.5 kg), SpO2 100 %. Body mass index is 36.48 kg/m. Physical Exam  Constitutional: She is oriented to person, place, and time. She appears well-developed and well-nourished.  HENT:  Head: Normocephalic.  Cardiovascular: Normal rate.  Pulmonary/Chest: Effort normal.  Musculoskeletal: Normal range of motion.  Neurological: She is alert and oriented to person, place, and time.  Skin: Skin is warm and dry.  Psychiatric: She has a normal mood and affect. Her behavior is normal.  Nursing note and vitals reviewed.   RECENT LABS AND TESTS: BMET    Component Value Date/Time   NA 141 07/05/2018 1200   K 4.2 07/05/2018 1200   CL 101 07/05/2018 1200   CO2 24 07/05/2018 1200   GLUCOSE 97 07/05/2018 1200   GLUCOSE 96 09/27/2017 1204   GLUCOSE 89 09/20/2006 0915   BUN 16 07/05/2018 1200   CREATININE 0.72 07/05/2018 1200   CALCIUM 9.7 07/05/2018 1200   GFRNONAA 89 07/05/2018 1200   GFRAA 102 07/05/2018 1200   Lab Results  Component Value  Date   HGBA1C 6.2 (H) 07/05/2018   HGBA1C 6.2 (H) 02/09/2018   HGBA1C 6.4 09/27/2017   HGBA1C 6.4 03/28/2017   HGBA1C 6.2 09/27/2016   Lab Results  Component Value Date   INSULIN 15.7 07/05/2018   INSULIN 23.1 02/09/2018   CBC    Component Value Date/Time   WBC 6.6 02/09/2018 1053   WBC 7.2 05/07/2016 1229   RBC 4.46 02/09/2018 1053   RBC 4.45 05/07/2016 1229   HGB 12.4 02/09/2018 1053   HCT 38.6 02/09/2018 1053   PLT 249.0 05/07/2016 1229   MCV 87 02/09/2018 1053   MCH 27.8 02/09/2018 1053   MCHC 32.1 02/09/2018 1053   MCHC 33.1 05/07/2016 1229   RDW 15.4 02/09/2018 1053   LYMPHSABS 1.5 02/09/2018 1053   MONOABS 0.5 05/07/2016 1229   EOSABS 0.1 02/09/2018 1053   BASOSABS 0.0 02/09/2018 1053   Iron/TIBC/Ferritin/ %Sat No results found for: IRON, TIBC, FERRITIN, IRONPCTSAT Lipid Panel     Component Value Date/Time   CHOL 183 02/09/2018 1053   TRIG 99 02/09/2018 1053   TRIG 51 09/20/2006 0915   HDL 55 02/09/2018 1053   CHOLHDL 4 09/27/2017 1204   VLDL 26.4 09/27/2017 1204   LDLCALC 108 (H) 02/09/2018 1053   Hepatic Function Panel     Component Value Date/Time   PROT 7.0 07/05/2018 1200   ALBUMIN 4.5 07/05/2018 1200   AST 9 07/05/2018 1200   ALT 9 07/05/2018 1200   ALKPHOS 149 (H) 07/05/2018 1200   BILITOT 0.2 07/05/2018 1200   BILIDIR 0.1 04/22/2014 1254      Component Value Date/Time   TSH 2.020 02/09/2018 1053   TSH 2.39 09/27/2016 1608   TSH 1.74 03/23/2016 0953  Ref. Range 07/05/2018 12:00  Vitamin D, 25-Hydroxy Latest Ref Range: 30.0 - 100.0 ng/mL 46.0   ASSESSMENT AND PLAN: Vitamin D deficiency - Plan: VITAMIN D 25 Hydroxy (Vit-D Deficiency, Fractures), Vitamin D, Ergocalciferol, (DRISDOL) 50000 units CAPS capsule  Type 2 diabetes mellitus without complication, without long-term current use of insulin (HCC) - Plan: Comprehensive metabolic panel, Hemoglobin A1c, Insulin, random  Class 2 severe obesity with serious comorbidity and body mass  index (BMI) of 36.0 to 36.9 in adult, unspecified obesity type ([place])  PLAN: Vitamin D Deficiency Erica [name] was informed that low vitamin D levels contributes to fatigue and are associated with obesity, breast, and colon cancer. She agrees to continue to take prescription Vit D '@50'$ ,000 IU every week #4 with no refills and will follow up for routine testing of vitamin D, at least 2-3 times per year. She was informed of the risk of over-replacement of vitamin D and agrees to not increase her dose unless she discusses this with [place] first.  Diabetes II Erica [name] has been given extensive diabetes education by myself today including ideal fasting and post-prandial blood glucose readings, individual ideal HgA1c goals  and hypoglycemia prevention. We discussed the importance of good blood sugar control to decrease the likelihood of diabetic complications such as nephropathy, neuropathy, limb loss, blindness, coronary artery disease, and death. We discussed the importance of intensive lifestyle modification including diet, exercise and weight loss as the first line treatment for diabetes. Erica [name] agrees to continue her diabetes medications and will follow up at the agreed upon time.  Obesity Erica [name] is currently in the action stage of change. As such, her goal is to continue with weight loss efforts She has agreed to keep a food journal with 1000 calories and 70g protein . Discussed resuming journaling, she agrees to start back, but if she has issues can use Category 1.  Erica [name] has been instructed to work up to a goal of 150 minutes of combined cardio and strengthening exercise per week for weight loss and overall health benefits. We discussed the following Behavioral Modification Strategies today: increasing lean protein intake, increasing vegetables, increasing water intake, no skipping meals, and work on meal planning and easy cooking plans.   Erica [name] has agreed to follow up with our clinic in 2  weeks. She was informed of the importance of frequent follow up visits to maximize her success with intensive lifestyle modifications for her multiple health conditions.   OBESITY BEHAVIORAL INTERVENTION VISIT  Today's visit was # 7   Starting weight: 241 lb Starting date: 02/09/18 Today's weight : 226 lb Today's date: 07/05/18 Total lbs lost to date: 15 lb At least 15 minutes were spent on discussing the following behavioral intervention visit.   ASK: We discussed the diagnosis of obesity with Erica [name] Alert today and Erica [name] agreed to give [place] permission to discuss obesity behavioral modification therapy today.  ASSESS: Erica [name] has the diagnosis of obesity and her BMI today is 36.49 Erica [name] is in the action stage of change   ADVISE: Erica [name] was educated on the multiple health risks of obesity as well as the benefit of weight loss to improve her health. She was advised of the need for long term treatment and the importance of lifestyle modifications to improve her current health and to decrease her risk of future health problems.  AGREE: Multiple dietary modification options and treatment options were discussed and  Erica [name] agreed to follow the recommendations documented in the above note.  ARRANGE: Kathie was educated on  the importance of frequent visits to treat obesity as outlined per CMS and USPSTF guidelines and agreed to schedule her next follow up appointment today.  Leary Roca, am acting as transcriptionist for [employer], DO  I have reviewed the above documentation for accuracy and completeness, and I agree with the above. -Jearld Lesch, DO

## 2018-07-19 ENCOUNTER — Ambulatory Visit (INDEPENDENT_AMBULATORY_CARE_PROVIDER_SITE_OTHER): Payer: Self-pay | Admitting: Bariatrics

## 2018-07-31 ENCOUNTER — Encounter (INDEPENDENT_AMBULATORY_CARE_PROVIDER_SITE_OTHER): Payer: Self-pay

## 2018-08-07 ENCOUNTER — Ambulatory Visit (INDEPENDENT_AMBULATORY_CARE_PROVIDER_SITE_OTHER): Payer: Medicare Other | Admitting: Bariatrics

## 2018-08-07 VITALS — BP 111/75 | HR 77 | Temp 98.6°F | Ht 66.0 in | Wt 231.0 lb

## 2018-08-07 DIAGNOSIS — E119 Type 2 diabetes mellitus without complications: Secondary | ICD-10-CM

## 2018-08-07 DIAGNOSIS — F3289 Other specified depressive episodes: Secondary | ICD-10-CM | POA: Diagnosis not present

## 2018-08-07 DIAGNOSIS — K5909 Other constipation: Secondary | ICD-10-CM | POA: Diagnosis not present

## 2018-08-07 DIAGNOSIS — Z6837 Body mass index (BMI) 37.0-37.9, adult: Secondary | ICD-10-CM

## 2018-08-07 DIAGNOSIS — E559 Vitamin D deficiency, unspecified: Secondary | ICD-10-CM

## 2018-08-07 MED ORDER — VITAMIN D (ERGOCALCIFEROL) 1.25 MG (50000 UNIT) PO CAPS: 50000.0000 [IU] | ORAL_CAPSULE | ORAL | 0 refills | Status: DC

## 2018-08-08 ENCOUNTER — Other Ambulatory Visit: Payer: Self-pay | Admitting: Internal Medicine

## 2018-08-08 DIAGNOSIS — R141 Gas pain: Secondary | ICD-10-CM

## 2018-08-08 DIAGNOSIS — R143 Flatulence: Principal | ICD-10-CM

## 2018-08-08 DIAGNOSIS — J069 Acute upper respiratory infection, unspecified: Secondary | ICD-10-CM

## 2018-08-08 DIAGNOSIS — R142 Eructation: Principal | ICD-10-CM

## 2018-08-08 MED ORDER — ALBUTEROL SULFATE HFA 108 (90 BASE) MCG/ACT IN AERS: 2.0000 | INHALATION_SPRAY | Freq: Four times a day (QID) | RESPIRATORY_TRACT | 11 refills | Status: DC | PRN

## 2018-08-08 MED ORDER — ATENOLOL 25 MG PO TABS: ORAL_TABLET | ORAL | 0 refills | Status: DC

## 2018-08-08 MED ORDER — FUROSEMIDE 40 MG PO TABS: 40.0000 mg | ORAL_TABLET | Freq: Two times a day (BID) | ORAL | 0 refills | Status: DC

## 2018-08-08 MED ORDER — SUMATRIPTAN-NAPROXEN SODIUM 85-500 MG PO TABS: 1.0000 | ORAL_TABLET | ORAL | 8 refills | Status: DC | PRN

## 2018-08-08 MED ORDER — METFORMIN HCL 500 MG PO TABS: 500.0000 mg | ORAL_TABLET | Freq: Every day | ORAL | 0 refills | Status: DC

## 2018-08-08 NOTE — Telephone Encounter (Signed)
Created in error

## 2018-08-08 NOTE — Progress Notes (Signed)
Office: [phone]  /  Fax: [phone]   HPI:   Chief Complaint: OBESITY Erica [name] is here to discuss her progress with her obesity treatment plan. She is on the  keep a food journal with 1000 calories and 70g of protein daily or follow the Category 1 plan and is following her eating plan approximately 85 % of the time. She states she is exercising 0 minutes 0 times per week. Erica [name] is currently struggling with experiencing grief due to the death of her mother.  Her weight is 231 lb (104.8 kg) today and has gained 5 lb since her last visit. She has lost 10 lbs since starting treatment with Erica [name].  Vitamin D deficiency Erica [name] has a diagnosis of vitamin D deficiency. She is currently taking vit D and denies nausea, vomiting or muscle weakness.  Ref. Range 07/05/2018 12:00  Vitamin D, 25-Hydroxy Latest Ref Range: 30.0 - 100.0 ng/mL 46.0   Diabetes II Erica [name] has a diagnosis of diabetes type II. Erica [name] denies any hypoglycemic episodes. Last A1c was 6.2. She has been working on intensive lifestyle modifications including diet, exercise, and weight loss to help control her blood glucose levels. She is currently taking Metformin with no side effects.   Constipation Erica [name] notes constipation for the last few weeks, worse since attempting weight loss. She states BM are less frequent and are not hard and painful. She denies hematochezia or melena. She denies drinking less H20 recently. She is currently taking Linzess as prescribed per PCP.   Depression with emotional eating behaviors Erica [name] is struggling with emotional eating and using food for comfort to the extent that it is negatively impacting her health. She often snacks when she is not hungry. Erica [name] sometimes feels she is out of control and then feels guilty that she made poor food choices. She has been working on behavior modification techniques to help reduce her emotional eating and has been somewhat successful. She has  seen Erica [name], psychologist, whom suggested she attend grief counseling. She shows no sign of suicidal or homicidal ideations.  Depression screen Erica [name] 2/9 07/05/2018 06/07/2018 04/27/2018 04/05/2018 02/09/2018  Decreased Interest '1 1 2 3 1  ' Down, Depressed, Hopeless '1 1 1 1 2  ' PHQ - 2 Score '2 2 3 4 3  ' Altered sleeping '2 2 1 3 3  ' Tired, decreased energy '3 2 1 3 3  ' Change in appetite 2 0 '2 2 1  ' Feeling bad or failure about yourself  0 0 0 2 3  Trouble concentrating '2 2 1 1 2  ' Moving slowly or fidgety/restless 2 0 1 1 0  Suicidal thoughts 0 0 0 0 1  PHQ-9 Score '13 8 9 16 16  ' Difficult doing work/chores - - - - Somewhat difficult    ALLERGIES: Allergies  Allergen Reactions  . Codeine Nausea And Vomiting  . Guaifenesin-Codeine Other (See Comments)    REACTION: feels spacey  . Wellbutrin [Bupropion] Palpitations    hallucinations    MEDICATIONS: Current Outpatient Medications on File Prior to Visit  Medication Sig Dispense Refill  . albuterol (PROVENTIL HFA;VENTOLIN HFA) 108 (90 Base) MCG/ACT inhaler Inhale 2 puffs into the lungs every 6 (six) hours as needed for wheezing or shortness of breath. 1 Inhaler 11  . ALPRAZolam (XANAX) 0.5 MG tablet Take 1 tablet (0.5 mg total) by mouth 2 (two) times daily as needed for anxiety. 60 tablet 1  . atenolol (TENORMIN) 25 MG tablet Take 20m (1 tablet) in the AM and 575m(2 tablets)  in the PM 180 tablet 3  . baclofen (LIORESAL) 10 MG tablet baclofen 10 mg tablet  Take 1 tablet 3 times a day by oral route.    . blood glucose meter kit and supplies KIT Dispense based on patient and insurance preference. Use up to four times daily as directed. (FOR ICD-9 250.00, 250.01). 1 each 0  . ciclopirox (PENLAC) 8 % solution Apply topically at bedtime. Apply over nail & surrounding skin. Apply QD over previous coat.After7 days, may remove w alcohol, repeat 6.6 mL 3  . fluticasone (FLONASE) 50 MCG/ACT nasal spray Place 2 sprays into both nostrils daily. 48 g 11  .  furosemide (LASIX) 40 MG tablet Take 1 tablet (40 mg total) by mouth 2 (two) times daily. 180 tablet 0  . hydrocortisone (ANUSOL-HC) 2.5 % rectal cream Place rectally as needed. 30 g 3  . levocetirizine (XYZAL) 5 MG tablet Take 1 tablet (5 mg total) by mouth every evening. -- Office visit needed for further refills 90 tablet 0  . linaclotide (LINZESS) 145 MCG CAPS capsule Take 1 capsule (145 mcg total) by mouth daily. 90 capsule 1  . meclizine (ANTIVERT) 25 MG tablet Take 1 tablet (25 mg total) by mouth 3 (three) times daily as needed for dizziness or nausea. 180 tablet 3  . metFORMIN (GLUCOPHAGE) 500 MG tablet Take 1 tablet (500 mg total) by mouth daily with breakfast. -- Office visit needed for further refills 90 tablet 0  . Multiple Vitamins-Minerals (MULTIVITAMIN WITH MINERALS) tablet Take 1 tablet by mouth daily.    Marland Kitchen omeprazole (PRILOSEC) 20 MG capsule Take 1 capsule (20 mg total) by mouth daily. 90 capsule 1  . potassium chloride SA (K-DUR,KLOR-CON) 20 MEQ tablet Take 1 tablet (20 mEq total) by mouth 2 (two) times daily. 180 tablet 1  . simvastatin (ZOCOR) 40 MG tablet Take 1 tablet (40 mg total) by mouth daily at 6 PM. 90 tablet 1  . SUMAtriptan-naproxen (TREXIMET) 85-500 MG tablet Take 1 tablet by mouth every 2 (two) hours as needed for migraine. 10 tablet 8  . promethazine (PHENERGAN) 25 MG tablet Take 1 tablet (25 mg total) by mouth every 8 (eight) hours as needed for up to 7 days for nausea or vomiting (related to dizziness). 30 tablet 2  . [DISCONTINUED] oxycodone (OXY-IR) 5 MG capsule Take 5 mg by mouth every 4 (four) hours as needed. For pain.     No current facility-administered medications on file prior to visit.     PAST MEDICAL HISTORY: Past Medical History:  Diagnosis Date  . Abnormal stress test    a. 09/2016: NST showed a small defect of mild severity present in the basal inferoseptal and mid inferoseptal location, consistent with ischemia. --> medically managed  .  ALLERGIC RHINITIS   . Anxiety   . Back pain   . Bursitis   . Constipation   . DDD (degenerative disc disease), lumbar    ESI with [facility] (spring 2016)  . Depression   . Diabetes mellitus without complication ([place])   . Dry mouth   . Dyslipidemia   . Easy bruising   . External hemorrhoids   . Fatigue   . GERD (gastroesophageal reflux disease)   . Hay fever   . Hypertension   . Knee pain   . Leg cramping   . Muscle stiffness   . Nervousness   . Obesity   . Osteoarthritis   . Palpitations    a. prior event monitor showing sinus tachycardia,  no PAF.   Marland Kitchen Panic attacks    Hx of depression  . Renal disorder   . Sinus pain   . Sleep apnea    CPAP hs  . Stress   . Trouble in sleeping     PAST SURGICAL HISTORY: Past Surgical History:  Procedure Laterality Date  . ABDOMINAL HYSTERECTOMY    . MASS EXCISION Left 12/30/2015   Procedure: EXCISION LEFT LEG MASS;  Surgeon: Donnie Mesa, MD;  Location: [facility];  Service: General;  Laterality: Left;  . REPLACEMENT TOTAL KNEE Right   . RIGHT OOPHORECTOMY     No cancer    SOCIAL HISTORY: Social History   Tobacco Use  . Smoking status: Never Smoker  . Smokeless tobacco: Never Used  Substance Use Topics  . Alcohol use: Yes    Alcohol/week: 1.0 standard drinks    Types: 1 Glasses of wine per week    Comment: one drink a month  . Drug use: No    FAMILY HISTORY: Family History  Problem Relation Age of Onset  . Heart disease Mother        Enlarged Heart, Pacemaker  . Diabetes Mother   . Thyroid disease Mother   . Hyperlipidemia Mother   . Hypertension Mother   . Sleep apnea Mother   . Dementia Father   . Kidney failure Father   . Prostate cancer Father   . Alcoholism Father   . Asthma Other   . Allergies Sister   . Asthma Son   . Breast cancer Maternal Aunt        cousin  . Colon cancer Cousin   . Heart disease Son        CHF, morbid obesity  . Prostate cancer Maternal Uncle   . Diabetes Son      ROS: Review of Systems  Constitutional: Negative for weight loss.  Gastrointestinal: Negative for melena, nausea and vomiting.       Negative for hematochezia  Musculoskeletal:       Negative for muscle weakness  Endo/Heme/Allergies:       Negative for hypoglycemia   Psychiatric/Behavioral: Positive for depression. Negative for suicidal ideas.       Negative for homicidal ideations     PHYSICAL EXAM: Blood pressure 111/75, pulse 77, temperature 98.6 F (37 C), temperature source Oral, height '5\' 6"'  (1.676 m), weight 231 lb (104.8 kg), SpO2 96 %. Body mass index is 37.28 kg/m. Physical Exam  Constitutional: She is oriented to person, place, and time. She appears well-developed and well-nourished.  HENT:  Head: Normocephalic.  Neck: Normal range of motion.  Cardiovascular: Normal rate.  Pulmonary/Chest: Effort normal.  Musculoskeletal: Normal range of motion.  Neurological: She is [name] and oriented to person, place, and time.  Skin: Skin is warm and dry.  Psychiatric: She has a normal mood and affect. Her behavior is normal.  Vitals reviewed.   RECENT LABS AND TESTS: BMET    Component Value Date/Time   NA 141 07/05/2018 1200   K 4.2 07/05/2018 1200   CL 101 07/05/2018 1200   CO2 24 07/05/2018 1200   GLUCOSE 97 07/05/2018 1200   GLUCOSE 96 09/27/2017 1204   GLUCOSE 89 09/20/2006 0915   BUN 16 07/05/2018 1200   CREATININE 0.72 07/05/2018 1200   CALCIUM 9.7 07/05/2018 1200   GFRNONAA 89 07/05/2018 1200   GFRAA 102 07/05/2018 1200   Lab Results  Component Value Date   HGBA1C 6.2 (H) 07/05/2018   HGBA1C  6.2 (H) 02/09/2018   HGBA1C 6.4 09/27/2017   HGBA1C 6.4 03/28/2017   HGBA1C 6.2 09/27/2016   Lab Results  Component Value Date   INSULIN 15.7 07/05/2018   INSULIN 23.1 02/09/2018   CBC    Component Value Date/Time   WBC 6.6 02/09/2018 1053   WBC 7.2 05/07/2016 1229   RBC 4.46 02/09/2018 1053   RBC 4.45 05/07/2016 1229   HGB 12.4 02/09/2018 1053    HCT 38.6 02/09/2018 1053   PLT 249.0 05/07/2016 1229   MCV 87 02/09/2018 1053   MCH 27.8 02/09/2018 1053   MCHC 32.1 02/09/2018 1053   MCHC 33.1 05/07/2016 1229   RDW 15.4 02/09/2018 1053   LYMPHSABS 1.5 02/09/2018 1053   MONOABS 0.5 05/07/2016 1229   EOSABS 0.1 02/09/2018 1053   BASOSABS 0.0 02/09/2018 1053   Iron/TIBC/Ferritin/ %Sat No results found for: IRON, TIBC, FERRITIN, IRONPCTSAT Lipid Panel     Component Value Date/Time   CHOL 183 02/09/2018 1053   TRIG 99 02/09/2018 1053   TRIG 51 09/20/2006 0915   HDL 55 02/09/2018 1053   CHOLHDL 4 09/27/2017 1204   VLDL 26.4 09/27/2017 1204   LDLCALC 108 (H) 02/09/2018 1053   Hepatic Function Panel     Component Value Date/Time   PROT 7.0 07/05/2018 1200   ALBUMIN 4.5 07/05/2018 1200   AST 9 07/05/2018 1200   ALT 9 07/05/2018 1200   ALKPHOS 149 (H) 07/05/2018 1200   BILITOT 0.2 07/05/2018 1200   BILIDIR 0.1 04/22/2014 1254      Component Value Date/Time   TSH 2.020 02/09/2018 1053   TSH 2.39 09/27/2016 1608   TSH 1.74 03/23/2016 0953    Ref. Range 07/05/2018 12:00  Vitamin D, 25-Hydroxy Latest Ref Range: 30.0 - 100.0 ng/mL 46.0    ASSESSMENT AND PLAN: Vitamin D deficiency - Plan: Vitamin D, Ergocalciferol, (DRISDOL) 50000 units CAPS capsule  Type 2 diabetes mellitus without complication, without long-term current use of insulin (H[facility])  Other constipation  Other depression - with emotional eating  Class 2 severe obesity with serious comorbidity and body mass index (BMI) of 37.0 to 37.9 in adult, unspecified obesity type (Erica [name])  PLAN: Vitamin D Deficiency Erica [name] was informed that low vitamin D levels contributes to fatigue and are associated with obesity, breast, and colon cancer. She agrees to continue to take prescription Vit D '@50' ,000 IU every week  #4 with no refills and will follow up for routine testing of vitamin D, at least 2-3 times per year. She was informed of the risk of over-replacement of vitamin D  and agrees to not increase her dose unless she discusses this with Erica [name] first.  Diabetes II Erica [name] has been given extensive diabetes education by myself today including ideal fasting and post-prandial blood glucose readings, individual ideal HgA1c goals  and hypoglycemia prevention. We discussed the importance of good blood sugar control to decrease the likelihood of diabetic complications such as nephropathy, neuropathy, limb loss, blindness, coronary artery disease, and death. We discussed the importance of intensive lifestyle modification including diet, exercise and weight loss as the first line treatment for diabetes. She agrees to call her PCP for Metformin (to [place]). Erica [name] agrees to continue her diabetes medications and will follow up at the agreed upon time.  Constipation Erica [name] was informed decrease bowel movement frequency is normal while losing weight, but stools should not be hard or painful. She was advised to increase her H20 intake and work on increasing her fiber intake. High fiber  foods were discussed today. She agrees to start Miralax 17g daily and follow up with Linzess.   Depression with Emotional Eating Behaviors We discussed behavior modification techniques today to help Erica [name] deal with her emotional eating and depression. She has agreed to go to Hospice grief counseling.   Obesity Erica [name] is currently in the action stage of change. As such, her goal is to continue with weight loss efforts She has agreed to follow the Category 1 plan  Discussed stress eating strategies and she agrees to go to grief counseling through hospice.  Erica [name] has been instructed to work up to a goal of 150 minutes of combined cardio and strengthening exercise per week for weight loss and overall health benefits. We discussed the following Behavioral Modification Strategies today: increasing lean protein intake, decreasing simple carbohydrates , increasing vegetables, decrease eating out,  no skipping meals, keeping healthy foods in the home, increasing water intake, and work on meal planning and easy cooking plans.   Erica [name] has agreed to follow up with our clinic in 3 weeks. She was informed of the importance of frequent follow up visits to maximize her success with intensive lifestyle modifications for her multiple health conditions.   OBESITY BEHAVIORAL INTERVENTION VISIT  Today's visit was # 8   Starting weight: 241 lb Starting date: 02/09/18 Today's weight : 231 lb Today's date: 08/07/18 Total lbs lost to date: 10 lb At least 15 minutes were spent on discussing the following behavioral intervention visit.   ASK: We discussed the diagnosis of obesity with Erica [name] today and Erica [name] agreed to give Erica [name] permission to discuss obesity behavioral modification therapy today.  ASSESS: Erica [name] has the diagnosis of obesity and her BMI today is 79.3 Erica [name] is in the action stage of change   ADVISE: Erica [name] was educated on the multiple health risks of obesity as well as the benefit of weight loss to improve her health. She was advised of the need for long term treatment and the importance of lifestyle modifications to improve her current health and to decrease her risk of future health problems.  AGREE: Multiple dietary modification options and treatment options were discussed and  Jazlen agreed to follow the recommendations documented in the above note.  ARRANGE: Breiana was educated on the importance of frequent visits to treat obesity as outlined per CMS and USPSTF guidelines and agreed to schedule her next follow up appointment today.  Leary Roca, am acting as transcriptionist for [employer], DO   I have reviewed the above documentation for accuracy and completeness, and I agree with the above. -Jearld Lesch, DO

## 2018-08-08 NOTE — Telephone Encounter (Signed)
Copied from [place] [phone]. Topic: General - Other >> Aug 08, 2018 11:40 AM Janace Aris A wrote: Medication: albuterol, metFORMIN (GLUCOPHAGE) 500 MG , linaclotide (LINZESS) 145 MCG CAPS , atenolol (TENORMIN) 25 Mg, furosemide (LASIX) 40 Mg, SUMAtriptan-naproxen (TREXIMET) 85-500 MG tablet   Has the patient contacted their pharmacy? Yes    Preferred Pharmacy (with phone number or street name): [facility] - [place], [place] - [address] (Phone) [phone] (Fax)    Agent: Please be advised that RX refills may take up to 3 business days. We ask that you follow-up with your pharmacy.

## 2018-08-08 NOTE — Addendum Note (Signed)
Addended by: Addison Naegeli on: 08/08/2018 02:25 PM   Modules accepted: Orders

## 2018-08-08 NOTE — Telephone Encounter (Signed)
Requested Prescriptions  Pending Prescriptions Disp Refills  . metFORMIN (GLUCOPHAGE) 500 MG tablet 90 tablet 0    Sig: Take 1 tablet (500 mg total) by mouth daily with breakfast. -- Office visit needed for further refills     Endocrinology:  Diabetes - Biguanides Passed - 08/08/2018  2:25 PM      Passed - Cr in normal range and within 360 days    Creatinine, Ser  Date Value Ref Range Status  07/05/2018 0.72 0.57 - 1.00 mg/dL Final         Passed - HBA1C is between 0 and 7.9 and within 180 days    Hgb A1c MFr Bld  Date Value Ref Range Status  07/05/2018 6.2 (H) 4.8 - 5.6 % Final    Comment:             Prediabetes: 5.7 - 6.4          Diabetes: >6.4          Glycemic control for adults with diabetes: <7.0          Passed - eGFR in normal range and within 360 days    GFR calc Af Amer  Date Value Ref Range Status  07/05/2018 102 >59 mL/min/1.73 Final   GFR calc non Af Amer  Date Value Ref Range Status  07/05/2018 89 >59 mL/min/1.73 Final   GFR  Date Value Ref Range Status  09/27/2017 110.22 >60.00 mL/min Final         Passed - Valid encounter within last 6 months    Recent Outpatient Visits          4 months ago Essential hypertension, benign   [place], Claudina Lick, MD   8 months ago Onychomycosis   [place], MD   10 months ago Essential hypertension, benign   [place], Claudina Lick, MD   1 year ago Left hip pain   [facility], Enid Baas, MD   1 year ago Essential hypertension, benign   [place], MD      Future Appointments            In 3 days [place], Claudina Lick, MD [facility]         . linaclotide [facility]) 145 MCG CAPS capsule 90 capsule 1    Sig: Take 1 capsule (145 mcg total) by mouth daily.     Gastroenterology: Irritable Bowel Syndrome Passed  - 08/08/2018  2:25 PM      Passed - Valid encounter within last 12 months    Recent Outpatient Visits          4 months ago Essential hypertension, benign   [place], Claudina Lick, MD   8 months ago Onychomycosis   [place], MD   10 months ago Essential hypertension, benign   [place], Claudina Lick, MD   1 year ago Left hip pain   [facility], Enid Baas, MD   1 year ago Essential hypertension, benign   [place], MD      Future Appointments            In 3 days Burns, Claudina Lick, MD [place], [facility]  Signed Prescriptions Disp Refills   albuterol (PROVENTIL HFA;VENTOLIN HFA) 108 (90 Base) MCG/ACT inhaler 1 Inhaler 11    Sig: Inhale 2 puffs into the lungs every 6 (six) hours as needed for wheezing or shortness of breath.     Pulmonology:  Beta Agonists Failed - 08/08/2018  2:25 PM      Failed - One inhaler should last at least one month. If the patient is requesting refills earlier, contact the patient to check for uncontrolled symptoms.      Passed - Valid encounter within last 12 months    Recent Outpatient Visits          4 months ago Essential hypertension, benign   [place], Claudina Lick, MD   8 months ago Onychomycosis   [place], MD   10 months ago Essential hypertension, benign   [place], Claudina Lick, MD   1 year ago Left hip pain   [place], Enid Baas, MD   1 year ago Essential hypertension, benign   [place], MD      Future Appointments            In 3 days Burns, Claudina Lick, MD [place]          atenolol (TENORMIN) 25 MG tablet 270  tablet 0    Sig: Take 28m (1 tablet) in the AM and 539m(2 tablets) in the PM     Cardiovascular:  Beta Blockers Passed - 08/08/2018  2:25 PM      Passed - Last BP in normal range    BP Readings from Last 1 Encounters:  08/07/18 111/75         Passed - Last Heart Rate in normal range    Pulse Readings from Last 1 Encounters:  08/07/18 77         Passed - Valid encounter within last 6 months    Recent Outpatient Visits          4 months ago Essential hypertension, benign   [facility]   8 months ago Onychomycosis   [place]   10 months ago Essential hypertension, benign   [place]   1 year ago Left hip pain   [facility]   1 year ago Essential hypertension, benign   [place]      Future Appointments            In 3 days [place]          furosemide (LASIX) 40 MG tablet 180 tablet 0    Sig: Take 1 tablet (40 mg total) by mouth 2 (two) times daily.     Cardiovascular:  Diuretics - Loop Passed - 08/08/2018  2:25 PM      Passed - K in normal range and within 360 days    Potassium  Date Value Ref Range Status  07/05/2018 4.2 3.5 - 5.2 mmol/L Final         Passed - Ca in normal range and within 360 days    Calcium  Date Value Ref Range Status  07/05/2018  9.7 8.7 - 10.3 mg/dL Final         Passed - Na in normal range and within 360 days    Sodium  Date Value Ref Range Status  07/05/2018 141 134 - 144 mmol/L Final         Passed - Cr in normal range and within 360 days    Creatinine, Ser  Date Value Ref Range Status  07/05/2018 0.72 0.57 - 1.00 mg/dL Final         Passed - Last BP in normal range    BP Readings from Last 1 Encounters:  08/07/18 111/75         Passed -  Valid encounter within last 6 months    Recent Outpatient Visits          4 months ago Essential hypertension, benign   [place], Claudina Lick, MD   8 months ago Onychomycosis   [place], Claudina Lick, MD   10 months ago Essential hypertension, benign   [place], Claudina Lick, MD   1 year ago Left hip pain   [facility], Enid Baas, MD   1 year ago Essential hypertension, benign   [place], MD      Future Appointments            In 3 days Burns, Claudina Lick, MD [place], [facility]          SUMAtriptan-naproxen (TREXIMET) 85-500 MG tablet 10 tablet 8    Sig: Take 1 tablet by mouth every 2 (two) hours as needed for migraine.     Off-Protocol Failed - 08/08/2018  2:25 PM      Failed - Medication not assigned to a protocol, review manually.      Passed - Valid encounter within last 12 months    Recent Outpatient Visits          4 months ago Essential hypertension, benign   [place], Claudina Lick, MD   8 months ago Onychomycosis   [place], MD   10 months ago Essential hypertension, benign   [place], Claudina Lick, MD   1 year ago Left hip pain   [facility], Enid Baas, MD   1 year ago Essential hypertension, benign   [place], MD      Future Appointments            In 3 days Burns, Claudina Lick, MD [place], [place]  . atenolol (TENORMIN) 25 MG tablet 180 tablet 3    Sig: Take 61m (1 tablet) in the AM and 549m(2 tablets) in the PM     Cardiovascular:  Beta Blockers Passed - 08/08/2018  2:25 PM      Passed - Last BP in normal range    BP  Readings from Last 1 Encounters:  08/07/18 111/75         Passed - Last Heart Rate in normal range    Pulse Readings from Last 1 Encounters:  08/07/18 77         Passed - Valid encounter within last 6 months    Recent Outpatient Visits  4 months ago Essential hypertension, benign   [place], Claudina Lick, MD   8 months ago Onychomycosis   [place], MD   10 months ago Essential hypertension, benign   [place], Claudina Lick, MD   1 year ago Left hip pain   [facility], Enid Baas, MD   1 year ago Essential hypertension, benign   [place], MD      Future Appointments            In 3 days Burns, Claudina Lick, MD [facility] [place]         . furosemide (LASIX) 40 MG tablet 180 tablet 0    Sig: Take 1 tablet (40 mg total) by mouth 2 (two) times daily.     Cardiovascular:  Diuretics - Loop Passed - 08/08/2018  2:25 PM      Passed - K in normal range and within 360 days    Potassium  Date Value Ref Range Status  07/05/2018 4.2 3.5 - 5.2 mmol/L Final         Passed - Ca in normal range and within 360 days    Calcium  Date Value Ref Range Status  07/05/2018 9.7 8.7 - 10.3 mg/dL Final         Passed - Na in normal range and within 360 days    Sodium  Date Value Ref Range Status  07/05/2018 141 134 - 144 mmol/L Final         Passed - Cr in normal range and within 360 days    Creatinine, Ser  Date Value Ref Range Status  07/05/2018 0.72 0.57 - 1.00 mg/dL Final         Passed - Last BP in normal range    BP Readings from Last 1 Encounters:  08/07/18 111/75         Passed - Valid encounter within last 6 months    Recent Outpatient Visits          4 months ago Essential hypertension, benign   [place], Claudina Lick, MD   8 months  ago Onychomycosis   [place], Claudina Lick, MD   10 months ago Essential hypertension, benign   [place], Claudina Lick, MD   1 year ago Left hip pain   [facility], Enid Baas, MD   1 year ago Essential hypertension, benign   [place], MD      Future Appointments            In 3 days Burns, Claudina Lick, MD [facility]         . linaclotide [facility]) 145 MCG CAPS capsule 90 capsule 1    Sig: Take 1 capsule (145 mcg total) by mouth daily.     Gastroenterology: Irritable Bowel Syndrome Passed - 08/08/2018  2:25 PM      Passed - Valid encounter within last 12 months    Recent Outpatient Visits          4 months ago Essential hypertension, benign   [place], Claudina Lick, MD   8 months ago Onychomycosis   [facility], Claudina Lick, MD  10 months ago Essential hypertension, benign   [place], Claudina Lick, MD   1 year ago Left hip pain   [facility], Enid Baas, MD   1 year ago Essential hypertension, benign   [place], MD      Future Appointments            In 3 days Burns, Claudina Lick, MD [facility] [place]         . metFORMIN (GLUCOPHAGE) 500 MG tablet 90 tablet 0    Sig: Take 1 tablet (500 mg total) by mouth daily with breakfast. -- Office visit needed for further refills     Endocrinology:  Diabetes - Biguanides Passed - 08/08/2018  2:25 PM      Passed - Cr in normal range and within 360 days    Creatinine, Ser  Date Value Ref Range Status  07/05/2018 0.72 0.57 - 1.00 mg/dL Final         Passed - HBA1C is between 0 and 7.9 and within 180 days    Hgb A1c MFr Bld  Date Value Ref Range Status  07/05/2018 6.2 (H) 4.8 - 5.6  % Final    Comment:             Prediabetes: 5.7 - 6.4          Diabetes: >6.4          Glycemic control for adults with diabetes: <7.0          Passed - eGFR in normal range and within 360 days    GFR calc Af Amer  Date Value Ref Range Status  07/05/2018 102 >59 mL/min/1.73 Final   GFR calc non Af Amer  Date Value Ref Range Status  07/05/2018 89 >59 mL/min/1.73 Final   GFR  Date Value Ref Range Status  09/27/2017 110.22 >60.00 mL/min Final         Passed - Valid encounter within last 6 months    Recent Outpatient Visits          4 months ago Essential hypertension, benign   [place], Claudina Lick, MD   8 months ago Onychomycosis   [place], MD   10 months ago Essential hypertension, benign   [place], Claudina Lick, MD   1 year ago Left hip pain   [facility], Enid Baas, MD   1 year ago Essential hypertension, benign   [place], Claudina Lick, MD      Future Appointments            In 3 days Burns, Claudina Lick, MD [place]         . SUMAtriptan-naproxen (TREXIMET) 85-500 MG tablet 10 tablet 8    Sig: Take 1 tablet by mouth every 2 (two) hours as needed for migraine.     Off-Protocol Failed - 08/08/2018  2:25 PM      Failed - Medication not assigned to a protocol, review manually.      Passed - Valid encounter within last 12 months    Recent Outpatient Visits          4 months ago Essential hypertension, benign   [place], Claudina Lick, MD   8 months ago Onychomycosis  [place], Claudina Lick, MD   10 months ago Essential hypertension, benign   [place], Claudina Lick, MD   1 year ago Left hip pain   [facility], Enid Baas, MD   1 year ago  Essential hypertension, benign   [place], Claudina Lick, MD      Future Appointments            In 3 days Burns, Claudina Lick, MD [place], [place]         . albuterol (PROVENTIL HFA;VENTOLIN HFA) 108 (90 Base) MCG/ACT inhaler 1 Inhaler 11    Sig: Inhale 2 puffs into the lungs every 6 (six) hours as needed for wheezing or shortness of breath.     Pulmonology:  Beta Agonists Failed - 08/08/2018  2:25 PM      Failed - One inhaler should last at least one month. If the patient is requesting refills earlier, contact the patient to check for uncontrolled symptoms.      Passed - Valid encounter within last 12 months    Recent Outpatient Visits          4 months ago Essential hypertension, benign   [place], Claudina Lick, MD   8 months ago Onychomycosis   [place], MD   10 months ago Essential hypertension, benign   [place], Claudina Lick, MD   1 year ago Left hip pain   [facility], Enid Baas, MD   1 year ago Essential hypertension, benign   [place], MD      Future Appointments            In 3 days Burns, Claudina Lick, MD [place], [facility]

## 2018-08-08 NOTE — Telephone Encounter (Signed)
Requested Prescriptions  Pending Prescriptions Disp Refills  . albuterol (PROVENTIL HFA;VENTOLIN HFA) 108 (90 Base) MCG/ACT inhaler 1 Inhaler 11    Sig: Inhale 2 puffs into the lungs every 6 (six) hours as needed for wheezing or shortness of breath.     Pulmonology:  Beta Agonists Failed - 08/08/2018  2:25 PM      Failed - One inhaler should last at least one month. If the patient is requesting refills earlier, contact the patient to check for uncontrolled symptoms.      Passed - Valid encounter within last 12 months    Recent Outpatient Visits          4 months ago Essential hypertension, benign   [place], Claudina Lick, MD   8 months ago Onychomycosis   [place], MD   10 months ago Essential hypertension, benign   [place], Claudina Lick, MD   1 year ago Left hip pain   [facility], Enid Baas, MD   1 year ago Essential hypertension, benign   [place], Claudina Lick, MD           . metFORMIN (GLUCOPHAGE) 500 MG tablet 90 tablet 0    Sig: Take 1 tablet (500 mg total) by mouth daily with breakfast. -- Office visit needed for further refills     Endocrinology:  Diabetes - Biguanides Passed - 08/08/2018  2:25 PM      Passed - Cr in normal range and within 360 days    Creatinine, Ser  Date Value Ref Range Status  07/05/2018 0.72 0.57 - 1.00 mg/dL Final         Passed - HBA1C is between 0 and 7.9 and within 180 days    Hgb A1c MFr Bld  Date Value Ref Range Status  07/05/2018 6.2 (H) 4.8 - 5.6 % Final    Comment:             Prediabetes: 5.7 - 6.4          Diabetes: >6.4          Glycemic control for adults with diabetes: <7.0          Passed - eGFR in normal range and within 360 days    GFR calc Af Amer  Date Value Ref Range Status  07/05/2018 102 >59 mL/min/1.73 Final   GFR calc non Af Amer  Date Value  Ref Range Status  07/05/2018 89 >59 mL/min/1.73 Final   GFR  Date Value Ref Range Status  09/27/2017 110.22 >60.00 mL/min Final         Passed - Valid encounter within last 6 months    Recent Outpatient Visits          4 months ago Essential hypertension, benign   [place], MD   8 months ago Onychomycosis   [place], MD   10 months ago Essential hypertension, benign   [place], Claudina Lick, MD   1 year ago Left hip pain   [facility], Enid Baas, MD   1 year ago Essential hypertension, benign   [place], Claudina Lick, MD           . linaclotide [facility]) 145 MCG CAPS capsule 90 capsule 1  Sig: Take 1 capsule (145 mcg total) by mouth daily.     Gastroenterology: Irritable Bowel Syndrome Passed - 08/08/2018  2:25 PM      Passed - Valid encounter within last 12 months    Recent Outpatient Visits          4 months ago Essential hypertension, benign   [place], Claudina Lick, MD   8 months ago Onychomycosis   [place], MD   10 months ago Essential hypertension, benign   [place], Claudina Lick, MD   1 year ago Left hip pain   [place], Jeremy E, MD   1 year ago Essential hypertension, benign   [place], Claudina Lick, MD           . atenolol (TENORMIN) 25 MG tablet 180 tablet 3    Sig: Take 63m (1 tablet) in the AM and 546m(2 tablets) in the PM     Cardiovascular:  Beta Blockers Passed - 08/08/2018  2:25 PM      Passed - Last BP in normal range    BP Readings from Last 1 Encounters:  08/07/18 111/75         Passed - Last Heart Rate in normal range    Pulse Readings from Last 1 Encounters:  08/07/18 77          Passed - Valid encounter within last 6 months    Recent Outpatient Visits          4 months ago Essential hypertension, benign   [place]   8 months ago Onychomycosis   [place]   10 months ago Essential hypertension, benign   [place]   1 year ago Left hip pain   [place], [place]   1 year ago Essential hypertension, benign   [place]           . furosemide (LASIX) 40 MG tablet 180 tablet 0    Sig: Take 1 tablet (40 mg total) by mouth 2 (two) times daily.     Cardiovascular:  Diuretics - Loop Passed - 08/08/2018  2:25 PM      Passed - K in normal range and within 360 days    Potassium  Date Value Ref Range Status  07/05/2018 4.2 3.5 - 5.2 mmol/L Final         Passed - Ca in normal range and within 360 days    Calcium  Date Value Ref Range Status  07/05/2018 9.7 8.7 - 10.3 mg/dL Final         Passed - Na in normal range and within 360 days    Sodium  Date Value Ref Range Status  07/05/2018 141 134 - 144 mmol/L Final         Passed - Cr in normal range and within 360 days    Creatinine, Ser  Date Value Ref Range Status  07/05/2018 0.72 0.57 - 1.00 mg/dL Final         Passed - Last BP in normal range    BP Readings from Last 1 Encounters:  08/07/18 111/75         Passed - Valid encounter  within last 6 months    Recent Outpatient Visits          4 months ago Essential hypertension, benign   [place], Claudina Lick, MD   8 months ago Onychomycosis   [place], MD   10 months ago Essential hypertension, benign   [place], Claudina Lick, MD   1 year ago Left hip pain   [facility], Enid Baas, MD   1  year ago Essential hypertension, benign   [employer], Claudina Lick, MD           . SUMAtriptan-naproxen (TREXIMET) 85-500 MG tablet 10 tablet 8    Sig: Take 1 tablet by mouth every 2 (two) hours as needed for migraine.     Off-Protocol Failed - 08/08/2018  2:25 PM      Failed - Medication not assigned to a protocol, review manually.      Passed - Valid encounter within last 12 months    Recent Outpatient Visits          4 months ago Essential hypertension, benign   [place], Claudina Lick, MD   8 months ago Onychomycosis   [place], MD   10 months ago Essential hypertension, benign   [place], Claudina Lick, MD   1 year ago Left hip pain   [facility], Enid Baas, MD   1 year ago Essential hypertension, benign   [place], Claudina Lick, MD           Refused Prescriptions Disp Refills  . atenolol (TENORMIN) 25 MG tablet 180 tablet 3    Sig: Take '25mg'$  (1 tablet) in the AM and '50mg'$  (2 tablets) in the PM     Cardiovascular:  Beta Blockers Passed - 08/08/2018  2:25 PM      Passed - Last BP in normal range    BP Readings from Last 1 Encounters:  08/07/18 111/75         Passed - Last Heart Rate in normal range    Pulse Readings from Last 1 Encounters:  08/07/18 77         Passed - Valid encounter within last 6 months    Recent Outpatient Visits          4 months ago Essential hypertension, benign   [place], Claudina Lick, MD   8 months ago Onychomycosis   [place], MD   10 months ago Essential hypertension, benign   [place], MD   1 year ago Left hip pain   [place], Jeremy E, MD   1 year ago Essential hypertension, benign    [place], Claudina Lick, MD           . furosemide (LASIX) 40 MG tablet 180 tablet 0    Sig: Take 1 tablet (40 mg total) by mouth 2 (two) times daily.     Cardiovascular:  Diuretics - Loop Passed - 08/08/2018  2:25 PM      Passed - K in normal range and within 360 days    Potassium  Date Value Ref Range Status  07/05/2018  4.2 3.5 - 5.2 mmol/L Final         Passed - Ca in normal range and within 360 days    Calcium  Date Value Ref Range Status  07/05/2018 9.7 8.7 - 10.3 mg/dL Final         Passed - Na in normal range and within 360 days    Sodium  Date Value Ref Range Status  07/05/2018 141 134 - 144 mmol/L Final         Passed - Cr in normal range and within 360 days    Creatinine, Ser  Date Value Ref Range Status  07/05/2018 0.72 0.57 - 1.00 mg/dL Final         Passed - Last BP in normal range    BP Readings from Last 1 Encounters:  08/07/18 111/75         Passed - Valid encounter within last 6 months    Recent Outpatient Visits          4 months ago Essential hypertension, benign   [place], Claudina Lick, MD   8 months ago Onychomycosis   [place], Claudina Lick, MD   10 months ago Essential hypertension, benign   [place], MD   1 year ago Left hip pain   [place], Enid Baas, MD   1 year ago Essential hypertension, benign   [place], Claudina Lick, MD           . linaclotide [facility]) 145 MCG CAPS capsule 90 capsule 1    Sig: Take 1 capsule (145 mcg total) by mouth daily.     Gastroenterology: Irritable Bowel Syndrome Passed - 08/08/2018  2:25 PM      Passed - Valid encounter within last 12 months    Recent Outpatient Visits          4 months ago Essential hypertension, benign   [place], Claudina Lick, MD   8 months ago  Onychomycosis   [place], MD   10 months ago Essential hypertension, benign   [place], Claudina Lick, MD   1 year ago Left hip pain   [place], Jeremy E, MD   1 year ago Essential hypertension, benign   [place], Claudina Lick, MD           . metFORMIN (GLUCOPHAGE) 500 MG tablet 90 tablet 0    Sig: Take 1 tablet (500 mg total) by mouth daily with breakfast. -- Office visit needed for further refills     Endocrinology:  Diabetes - Biguanides Passed - 08/08/2018  2:25 PM      Passed - Cr in normal range and within 360 days    Creatinine, Ser  Date Value Ref Range Status  07/05/2018 0.72 0.57 - 1.00 mg/dL Final         Passed - HBA1C is between 0 and 7.9 and within 180 days    Hgb A1c MFr Bld  Date Value Ref Range Status  07/05/2018 6.2 (H) 4.8 - 5.6 % Final    Comment:             Prediabetes: 5.7 - 6.4          Diabetes: >6.4  Glycemic control for adults with diabetes: <7.0          Passed - eGFR in normal range and within 360 days    GFR calc Af Amer  Date Value Ref Range Status  07/05/2018 102 >59 mL/min/1.73 Final   GFR calc non Af Amer  Date Value Ref Range Status  07/05/2018 89 >59 mL/min/1.73 Final   GFR  Date Value Ref Range Status  09/27/2017 110.22 >60.00 mL/min Final         Passed - Valid encounter within last 6 months    Recent Outpatient Visits          4 months ago Essential hypertension, benign   [place], Claudina Lick, MD   8 months ago Onychomycosis   [place], MD   10 months ago Essential hypertension, benign   [place], Claudina Lick, MD   1 year ago Left hip pain   [facility], Enid Baas, MD   1 year ago Essential hypertension, benign   [place], Claudina Lick, MD           . SUMAtriptan-naproxen (TREXIMET) 85-500 MG tablet 10 tablet 8    Sig: Take 1 tablet by mouth every 2 (two) hours as needed for migraine.     Off-Protocol Failed - 08/08/2018  2:25 PM      Failed - Medication not assigned to a protocol, review manually.      Passed - Valid encounter within last 12 months    Recent Outpatient Visits          4 months ago Essential hypertension, benign   [employer], Claudina Lick, MD   8 months ago Onychomycosis   [place], MD   10 months ago Essential hypertension, benign   [place], Claudina Lick, MD   1 year ago Left hip pain   [place], Jeremy E, MD   1 year ago Essential hypertension, benign   [facility], Claudina Lick, MD           . albuterol (PROVENTIL HFA;VENTOLIN HFA) 108 (90 Base) MCG/ACT inhaler 1 Inhaler 11    Sig: Inhale 2 puffs into the lungs every 6 (six) hours as needed for wheezing or shortness of breath.     Pulmonology:  Beta Agonists Failed - 08/08/2018  2:25 PM      Failed - One inhaler should last at least one month. If the patient is requesting refills earlier, contact the patient to check for uncontrolled symptoms.      Passed - Valid encounter within last 12 months    Recent Outpatient Visits          4 months ago Essential hypertension, benign   [place], Claudina Lick, MD   8 months ago Onychomycosis   [place], MD   10 months ago Essential hypertension, benign   [place], Claudina Lick, MD   1 year ago Left hip pain   [facility], Enid Baas, MD   1 year ago Essential hypertension, benign   [facility], Claudina Lick, MD

## 2018-08-08 NOTE — Telephone Encounter (Addendum)
Pt requesting refill for metformin; per refill note dated 04/25/18 pt needs office visit for refills; contacted pt regarding this issue; pt offered and accepted appointment with Dr Billey Gosling, [facility], 08/11/18 at [facility]; she verbalized understanding; will route to office for notification.  Requested medication (s) are due for refill today: yes  Requested medication (s) are on the active medication list: yes  Last refill:  12/07/17  Future visit scheduled: yes 08/11/18  Notes to clinic:  No protocol    Requested Prescriptions  Pending Prescriptions Disp Refills   linaclotide (LINZESS) 145 MCG CAPS capsule 90 capsule 1    Sig: Take 1 capsule (145 mcg total) by mouth daily.     Gastroenterology: Irritable Bowel Syndrome Passed - 08/08/2018  2:25 PM      Passed - Valid encounter within last 12 months    Recent Outpatient Visits          4 months ago Essential hypertension, benign   [place], Claudina Lick, MD   8 months ago Onychomycosis   [place], MD   10 months ago Essential hypertension, benign   [place], Claudina Lick, MD   1 year ago Left hip pain   [facility], Enid Baas, MD   1 year ago Essential hypertension, benign   [place], MD      Future Appointments            In 3 days Burns, Claudina Lick, MD [place]         Signed Prescriptions Disp Refills   albuterol (PROVENTIL HFA;VENTOLIN HFA) 108 (90 Base) MCG/ACT inhaler 1 Inhaler 11    Sig: Inhale 2 puffs into the lungs every 6 (six) hours as needed for wheezing or shortness of breath.     Pulmonology:  Beta Agonists Failed - 08/08/2018  2:25 PM      Failed - One inhaler should last at least one month. If the patient is requesting refills earlier, contact the patient to check for uncontrolled symptoms.       Passed - Valid encounter within last 12 months    Recent Outpatient Visits          4 months ago Essential hypertension, benign   [place], Claudina Lick, MD   8 months ago Onychomycosis   [place], MD   10 months ago Essential hypertension, benign   [place], Claudina Lick, MD   1 year ago Left hip pain   [facility], Enid Baas, MD   1 year ago Essential hypertension, benign   [place], MD      Future Appointments            In 3 days Burns, Claudina Lick, MD [place]          metFORMIN (GLUCOPHAGE) 500 MG tablet 90 tablet 0    Sig: Take 1 tablet (500 mg total) by mouth daily with breakfast. -- Office visit needed for further refills     Endocrinology:  Diabetes - Biguanides Passed - 08/08/2018  2:25 PM      Passed - Cr in normal range and within 360 days    Creatinine, Ser  Date  Value Ref Range Status  07/05/2018 0.72 0.57 - 1.00 mg/dL Final         Passed - HBA1C is between 0 and 7.9 and within 180 days    Hgb A1c MFr Bld  Date Value Ref Range Status  07/05/2018 6.2 (H) 4.8 - 5.6 % Final    Comment:             Prediabetes: 5.7 - 6.4          Diabetes: >6.4          Glycemic control for adults with diabetes: <7.0          Passed - eGFR in normal range and within 360 days    GFR calc Af Amer  Date Value Ref Range Status  07/05/2018 102 >59 mL/min/1.73 Final   GFR calc non Af Amer  Date Value Ref Range Status  07/05/2018 89 >59 mL/min/1.73 Final   GFR  Date Value Ref Range Status  09/27/2017 110.22 >60.00 mL/min Final         Passed - Valid encounter within last 6 months    Recent Outpatient Visits          4 months ago Essential hypertension, benign   [place], Claudina Lick, MD   8 months ago Onychomycosis   [place], MD   10 months ago Essential hypertension, benign   [place], Claudina Lick, MD   1 year ago Left hip pain   [place], Enid Baas, MD   1 year ago Essential hypertension, benign   [place], MD      Future Appointments            In 3 days Burns, Claudina Lick, MD [place]          atenolol (TENORMIN) 25 MG tablet 270 tablet 0    Sig: Take 30m (1 tablet) in the AM and 533m(2 tablets) in the PM     Cardiovascular:  Beta Blockers Passed - 08/08/2018  2:25 PM      Passed - Last BP in normal range    BP Readings from Last 1 Encounters:  08/07/18 111/75         Passed - Last Heart Rate in normal range    Pulse Readings from Last 1 Encounters:  08/07/18 77         Passed - Valid encounter within last 6 months    Recent Outpatient Visits          4 months ago Essential hypertension, benign   [place]   8 months ago Onychomycosis   [place]   10 months ago Essential hypertension, benign   [place]   1 year ago Left hip pain   [place] Baas[place]   1 year ago Essential hypertension, benign   [place]      Future Appointments            In 3 days Burns, [place]          furosemide (LASIX) 40 MG tablet 180 tablet 0  Sig: Take 1 tablet (40 mg total) by mouth 2 (two) times daily.     Cardiovascular:  Diuretics - Loop Passed - 08/08/2018  2:25 PM      Passed - K in normal range and within 360 days    Potassium  Date Value Ref Range Status  07/05/2018 4.2 3.5 - 5.2 mmol/L Final         Passed - Ca in normal range  and within 360 days    Calcium  Date Value Ref Range Status  07/05/2018 9.7 8.7 - 10.3 mg/dL Final         Passed - Na in normal range and within 360 days    Sodium  Date Value Ref Range Status  07/05/2018 141 134 - 144 mmol/L Final         Passed - Cr in normal range and within 360 days    Creatinine, Ser  Date Value Ref Range Status  07/05/2018 0.72 0.57 - 1.00 mg/dL Final         Passed - Last BP in normal range    BP Readings from Last 1 Encounters:  08/07/18 111/75         Passed - Valid encounter within last 6 months    Recent Outpatient Visits          4 months ago Essential hypertension, benign   [place], Claudina Lick, MD   8 months ago Onychomycosis   [place], Claudina Lick, MD   10 months ago Essential hypertension, benign   [place], Claudina Lick, MD   1 year ago Left hip pain   [facility], Enid Baas, MD   1 year ago Essential hypertension, benign   [place], Claudina Lick, MD      Future Appointments            In 3 days Burns, Claudina Lick, MD [place]          SUMAtriptan-naproxen (TREXIMET) 85-500 MG tablet 10 tablet 8    Sig: Take 1 tablet by mouth every 2 (two) hours as needed for migraine.     Off-Protocol Failed - 08/08/2018  2:25 PM      Failed - Medication not assigned to a protocol, review manually.      Passed - Valid encounter within last 12 months    Recent Outpatient Visits          4 months ago Essential hypertension, benign   [place], Claudina Lick, MD   8 months ago Onychomycosis   [place], MD   10 months ago Essential hypertension, benign   [place], Claudina Lick, MD   1 year ago Left hip pain   [facility],  Enid Baas, MD   1 year ago Essential hypertension, benign   [place], MD      Future Appointments            In 3 days Burns, Claudina Lick, MD [place]         Refused Prescriptions Disp Refills   atenolol (TENORMIN) 25 MG tablet 180 tablet 3    Sig: Take 50m (1 tablet) in the AM and 55m(2 tablets) in  the PM     Cardiovascular:  Beta Blockers Passed - 08/08/2018  2:25 PM      Passed - Last BP in normal range    BP Readings from Last 1 Encounters:  08/07/18 111/75         Passed - Last Heart Rate in normal range    Pulse Readings from Last 1 Encounters:  08/07/18 77         Passed - Valid encounter within last 6 months    Recent Outpatient Visits          4 months ago Essential hypertension, benign   [place], Claudina Lick, MD   8 months ago Onychomycosis   [place], Claudina Lick, MD   10 months ago Essential hypertension, benign   [place], Claudina Lick, MD   1 year ago Left hip pain   [place], Enid Baas, MD   1 year ago Essential hypertension, benign   [place], MD      Future Appointments            In 3 days Burns, Claudina Lick, MD [facility]          furosemide (LASIX) 40 MG tablet 180 tablet 0    Sig: Take 1 tablet (40 mg total) by mouth 2 (two) times daily.     Cardiovascular:  Diuretics - Loop Passed - 08/08/2018  2:25 PM      Passed - K in normal range and within 360 days    Potassium  Date Value Ref Range Status  07/05/2018 4.2 3.5 - 5.2 mmol/L Final         Passed - Ca in normal range and within 360 days    Calcium  Date Value Ref Range Status  07/05/2018 9.7 8.7 - 10.3 mg/dL Final         Passed - Na in normal range and within 360 days    Sodium  Date Value Ref Range  Status  07/05/2018 141 134 - 144 mmol/L Final         Passed - Cr in normal range and within 360 days    Creatinine, Ser  Date Value Ref Range Status  07/05/2018 0.72 0.57 - 1.00 mg/dL Final         Passed - Last BP in normal range    BP Readings from Last 1 Encounters:  08/07/18 111/75         Passed - Valid encounter within last 6 months    Recent Outpatient Visits          4 months ago Essential hypertension, benign   [place], Claudina Lick, MD   8 months ago Onychomycosis   [place], MD   10 months ago Essential hypertension, benign   [place], Claudina Lick, MD   1 year ago Left hip pain   [facility], Enid Baas, MD   1 year ago Essential hypertension, benign   [place], Claudina Lick, MD      Future Appointments            In 3 days Burns, Claudina Lick, MD [place], [facility]  linaclotide (LINZESS) 145 MCG CAPS capsule 90 capsule 1    Sig: Take 1 capsule (145 mcg total) by mouth daily.     Gastroenterology: Irritable Bowel Syndrome Passed - 08/08/2018  2:25 PM      Passed - Valid encounter within last 12 months    Recent Outpatient Visits          4 months ago Essential hypertension, benign   [place], Claudina Lick, MD   8 months ago Onychomycosis   [place], MD   10 months ago Essential hypertension, benign   [place], Claudina Lick, MD   1 year ago Left hip pain   [facility], Enid Baas, MD   1 year ago Essential hypertension, benign   [place], MD      Future Appointments            In 3 days Burns, Claudina Lick, MD [place]          metFORMIN  (GLUCOPHAGE) 500 MG tablet 90 tablet 0    Sig: Take 1 tablet (500 mg total) by mouth daily with breakfast. -- Office visit needed for further refills     Endocrinology:  Diabetes - Biguanides Passed - 08/08/2018  2:25 PM      Passed - Cr in normal range and within 360 days    Creatinine, Ser  Date Value Ref Range Status  07/05/2018 0.72 0.57 - 1.00 mg/dL Final         Passed - HBA1C is between 0 and 7.9 and within 180 days    Hgb A1c MFr Bld  Date Value Ref Range Status  07/05/2018 6.2 (H) 4.8 - 5.6 % Final    Comment:             Prediabetes: 5.7 - 6.4          Diabetes: >6.4          Glycemic control for adults with diabetes: <7.0          Passed - eGFR in normal range and within 360 days    GFR calc Af Amer  Date Value Ref Range Status  07/05/2018 102 >59 mL/min/1.73 Final   GFR calc non Af Amer  Date Value Ref Range Status  07/05/2018 89 >59 mL/min/1.73 Final   GFR  Date Value Ref Range Status  09/27/2017 110.22 >60.00 mL/min Final         Passed - Valid encounter within last 6 months    Recent Outpatient Visits          4 months ago Essential hypertension, benign   [place], Stacy J, MD   8 months ago Onychomycosis   [place], MD   10 months ago Essential hypertension, benign   [place], Claudina Lick, MD   1 year ago Left hip pain   [facility], Enid Baas, MD   1 year ago Essential hypertension, benign   [place], Claudina Lick, MD      Future Appointments            In 3 days Burns, Claudina Lick, MD [place]          SUMAtriptan-naproxen (TREXIMET)  85-500 MG tablet 10 tablet 8    Sig: Take 1 tablet by mouth every 2 (two) hours as needed for migraine.     Off-Protocol Failed - 08/08/2018  2:25 PM      Failed - Medication not assigned to a protocol,  review manually.      Passed - Valid encounter within last 12 months    Recent Outpatient Visits          4 months ago Essential hypertension, benign   [place], Claudina Lick, MD   8 months ago Onychomycosis   [place], MD   10 months ago Essential hypertension, benign   [place], Claudina Lick, MD   1 year ago Left hip pain   [facility], Enid Baas, MD   1 year ago Essential hypertension, benign   [place], MD      Future Appointments            In 3 days Burns, Claudina Lick, MD [facility]          albuterol (PROVENTIL HFA;VENTOLIN HFA) 108 (90 Base) MCG/ACT inhaler 1 Inhaler 11    Sig: Inhale 2 puffs into the lungs every 6 (six) hours as needed for wheezing or shortness of breath.     Pulmonology:  Beta Agonists Failed - 08/08/2018  2:25 PM      Failed - One inhaler should last at least one month. If the patient is requesting refills earlier, contact the patient to check for uncontrolled symptoms.      Passed - Valid encounter within last 12 months    Recent Outpatient Visits          4 months ago Essential hypertension, benign   [place], Claudina Lick, MD   8 months ago Onychomycosis   [place], MD   10 months ago Essential hypertension, benign   [place], Claudina Lick, MD   1 year ago Left hip pain   [facility], Enid Baas, MD   1 year ago Essential hypertension, benign   [place], MD      Future Appointments            In 3 days Burns, Claudina Lick, MD [place], [facility]

## 2018-08-09 ENCOUNTER — Ambulatory Visit: Payer: Medicare Other | Admitting: Internal Medicine

## 2018-08-09 ENCOUNTER — Encounter (INDEPENDENT_AMBULATORY_CARE_PROVIDER_SITE_OTHER): Payer: Self-pay | Admitting: Bariatrics

## 2018-08-09 MED ORDER — LINACLOTIDE 145 MCG PO CAPS: 145.0000 ug | ORAL_CAPSULE | Freq: Every day | ORAL | 1 refills | Status: DC

## 2018-08-09 NOTE — Telephone Encounter (Signed)
Sent rx to [facility].Marland KitchenJohny Chess

## 2018-08-09 NOTE — Addendum Note (Signed)
Addended by: Earnstine Regal on: 08/09/2018 09:20 AM   Modules accepted: Orders

## 2018-08-10 NOTE — Progress Notes (Signed)
Subjective:    Patient ID: Erica [name] Alert, female    DOB: 30-Sep-1956, 62 y.o.   MRN: [id number]  HPI The patient is here for follow up.  Diabetes: She is taking her medication daily as prescribed. She is compliant with a diabetic diet. She is not exercising regularly. She  denies foot lesions. She is up-to-date with an ophthalmology examination.   Hypertension: She is taking her medication daily. She is compliant with a low sodium diet.  She denies chest pain, palpitations, edema, shortness of breath. She is not exercising regularly.  She does not monitor her blood pressure at home.    Hyperlipidemia: She is taking her medication daily. She is compliant with a low fat/cholesterol diet. She is not exercising regularly. She denies myalgias.   URI:  Her symptoms started last week.  She has chills, nasal congestion, PND, sinus pressure, sore throat and cough from the PND.  At times she feels like she is wheezing.  She has had some headaches.  She is taking xyzal and flonase.   Migraines: has 1 migraine a month.  She uses the trximet as needed and it works well.    GERD:  She is taking her medication daily as prescribed.  She denies any GERD symptoms and feels her GERD is well controlled.   Constipation:  She is taking linzess which works mostly, but she still has some constipation.    Medications and allergies reviewed with patient and updated if appropriate.  Patient Active Problem List   Diagnosis Date Noted  . Flatulence, eructation, and gas pain 12/07/2017  . Onychomycosis 12/07/2017  . Nausea without vomiting 12/07/2017  . Left hip pain 06/12/2017  . Acute left-sided low back pain without sciatica 06/12/2017  . Meningioma ([place]) 03/28/2017  . Panic attacks 09/27/2016  . GERD (gastroesophageal reflux disease) 09/27/2016  . Grief reaction 08/23/2016  . Right ankle pain 08/19/2016  . Diabetes ([place]) 02/15/2016  . Skin nodule 11/28/2015  . Migraine without aura and with status  migrainosus, not intractable 10/03/2015  . Numbness of left hand 10/03/2015  . Cephalalgia 10/03/2015  . Abnormal ECG 09/11/2012  . Palpitations 09/08/2012  . Sciatica of left side   . Peripheral edema 05/09/2012  . FATIGUE 02/23/2010  . Obstructive sleep apnea 10/31/2009  . Morbid obesity ([place]) 10/17/2009  . HEMORRHOIDS, EXTERNAL 10/17/2009  . CONSTIPATION, CHRONIC 10/17/2009  . Hyperlipidemia 01/12/2008  . Depression, major, recurrent ([place]) 01/12/2008  . Essential hypertension, benign 01/12/2008  . Allergic rhinitis 01/12/2008    Current Outpatient Medications on File Prior to Visit  Medication Sig Dispense Refill  . albuterol (PROVENTIL HFA;VENTOLIN HFA) 108 (90 Base) MCG/ACT inhaler Inhale 2 puffs into the lungs every 6 (six) hours as needed for wheezing or shortness of breath. 1 Inhaler 11  . ALPRAZolam (XANAX) 0.5 MG tablet Take 1 tablet (0.5 mg total) by mouth 2 (two) times daily as needed for anxiety. 60 tablet 1  . atenolol (TENORMIN) 25 MG tablet Take 43m (1 tablet) in the AM and 54m(2 tablets) in the PM 270 tablet 0  . baclofen (LIORESAL) 10 MG tablet baclofen 10 mg tablet  Take 1 tablet 3 times a day by oral route.    . blood glucose meter kit and supplies KIT Dispense based on patient and insurance preference. Use up to four times daily as directed. (FOR ICD-9 250.00, 250.01). 1 each 0  . ciclopirox (PENLAC) 8 % solution Apply topically at bedtime. Apply over nail & surrounding  skin. Apply QD over previous coat.After7 days, may remove w alcohol, repeat 6.6 mL 3  . fluticasone (FLONASE) 50 MCG/ACT nasal spray Place 2 sprays into both nostrils daily. 48 g 11  . furosemide (LASIX) 40 MG tablet Take 1 tablet (40 mg total) by mouth 2 (two) times daily. 180 tablet 0  . hydrocortisone (ANUSOL-HC) 2.5 % rectal cream Place rectally as needed. 30 g 3  . levocetirizine (XYZAL) 5 MG tablet Take 1 tablet (5 mg total) by mouth every evening. -- Office visit needed for further refills  90 tablet 0  . linaclotide (LINZESS) 145 MCG CAPS capsule Take 1 capsule (145 mcg total) by mouth daily. 90 capsule 1  . meclizine (ANTIVERT) 25 MG tablet Take 1 tablet (25 mg total) by mouth 3 (three) times daily as needed for dizziness or nausea. 180 tablet 3  . metFORMIN (GLUCOPHAGE) 500 MG tablet Take 1 tablet (500 mg total) by mouth daily with breakfast. -- Office visit needed for further refills 90 tablet 0  . Multiple Vitamins-Minerals (MULTIVITAMIN WITH MINERALS) tablet Take 1 tablet by mouth daily.    Marland Kitchen omeprazole (PRILOSEC) 20 MG capsule Take 1 capsule (20 mg total) by mouth daily. 90 capsule 1  . potassium chloride SA (K-DUR,KLOR-CON) 20 MEQ tablet Take 1 tablet (20 mEq total) by mouth 2 (two) times daily. 180 tablet 1  . simvastatin (ZOCOR) 40 MG tablet Take 1 tablet (40 mg total) by mouth daily at 6 PM. 90 tablet 1  . SUMAtriptan-naproxen (TREXIMET) 85-500 MG tablet Take 1 tablet by mouth every 2 (two) hours as needed for migraine. 10 tablet 8  . Vitamin D, Ergocalciferol, (DRISDOL) 50000 units CAPS capsule Take 1 capsule (50,000 Units total) by mouth every 7 (seven) days. 4 capsule 0  . promethazine (PHENERGAN) 25 MG tablet Take 1 tablet (25 mg total) by mouth every 8 (eight) hours as needed for up to 7 days for nausea or vomiting (related to dizziness). 30 tablet 2  . [DISCONTINUED] oxycodone (OXY-IR) 5 MG capsule Take 5 mg by mouth every 4 (four) hours as needed. For pain.     No current facility-administered medications on file prior to visit.     Past Medical History:  Diagnosis Date  . Abnormal stress test    a. 09/2016: NST showed a small defect of mild severity present in the basal inferoseptal and mid inferoseptal location, consistent with ischemia. --> medically managed  . ALLERGIC RHINITIS   . Anxiety   . Back pain   . Bursitis   . Constipation   . DDD (degenerative disc disease), lumbar    ESI with Ramos (spring 2016)  . Depression   . Diabetes mellitus without  complication ([place])   . Dry mouth   . Dyslipidemia   . Easy bruising   . External hemorrhoids   . Fatigue   . GERD (gastroesophageal reflux disease)   . Hay fever   . Hypertension   . Knee pain   . Leg cramping   . Muscle stiffness   . Nervousness   . Obesity   . Osteoarthritis   . Palpitations    a. prior event monitor showing sinus tachycardia, no PAF.   Marland Kitchen Panic attacks    Hx of depression  . Renal disorder   . Sinus pain   . Sleep apnea    CPAP hs  . Stress   . Trouble in sleeping     Past Surgical History:  Procedure Laterality Date  . ABDOMINAL  HYSTERECTOMY    . MASS EXCISION Left 12/30/2015   Procedure: EXCISION LEFT LEG MASS;  Surgeon: Donnie Mesa, MD;  Location: [facility];  Service: General;  Laterality: Left;  . REPLACEMENT TOTAL KNEE Right   . RIGHT OOPHORECTOMY     No cancer    Social History   Socioeconomic History  . Marital status: Married    Spouse name: [name]  . Number of children: 3  . Years of education: Not on file  . Highest education level: Not on file  Occupational History  . Occupation: Disabled  Social Needs  . Financial resource strain: Not on file  . Food insecurity:    Worry: Not on file    Inability: Not on file  . Transportation needs:    Medical: Not on file    Non-medical: Not on file  Tobacco Use  . Smoking status: Never Smoker  . Smokeless tobacco: Never Used  Substance and Sexual Activity  . Alcohol use: Yes    Alcohol/week: 1.0 standard drinks    Types: 1 Glasses of wine per week    Comment: one drink a month  . Drug use: No  . Sexual activity: Not Currently  Lifestyle  . Physical activity:    Days per week: Not on file    Minutes per session: Not on file  . Stress: Not on file  Relationships  . Social connections:    Talks on phone: Not on file    Gets together: Not on file    Attends religious service: Not on file    Active member of club or organization: Not on file    Attends meetings of  clubs or organizations: Not on file    Relationship status: Not on file  Other Topics Concern  . Not on file  Social History Narrative   Married with children. Pt is on disablity. Previous worked in the school system    Family History  Problem Relation Age of Onset  . Heart disease Mother        Enlarged Heart, Pacemaker  . Diabetes Mother   . Thyroid disease Mother   . Hyperlipidemia Mother   . Hypertension Mother   . Sleep apnea Mother   . Dementia Father   . Kidney failure Father   . Prostate cancer Father   . Alcoholism Father   . Asthma Other   . Allergies Sister   . Asthma Son   . Breast cancer Maternal Aunt        cousin  . Colon cancer Cousin   . Heart disease Son        CHF, morbid obesity  . Prostate cancer Maternal Uncle   . Diabetes Son     Review of Systems  Constitutional: Positive for chills. Negative for fever.  HENT: Positive for congestion, postnasal drip, sinus pressure and sore throat (initially, resolved). Negative for ear pain.   Respiratory: Positive for cough (from PND) and wheezing. Negative for shortness of breath.   Cardiovascular: Negative for chest pain, palpitations and leg swelling.  Neurological: Positive for headaches. Negative for dizziness and light-headedness.       Objective:   Vitals:   08/11/18 0842  BP: 122/74  Pulse: 73  Resp: 16  Temp: 98.5 F (36.9 C)  SpO2: 97%   BP Readings from Last 3 Encounters:  08/11/18 122/74  08/07/18 111/75  07/05/18 121/83   Wt Readings from Last 3 Encounters:  08/11/18 231 lb (104.8 kg)  08/07/18  231 lb (104.8 kg)  07/05/18 226 lb (102.5 kg)   Body mass index is 37.28 kg/m.   Physical Exam    Constitutional: Appears well-developed and well-nourished. No distress.  HENT:  Head: Normocephalic and atraumatic.  Neck: Neck supple. No tracheal deviation present. No thyromegaly present.  No cervical lymphadenopathy Cardiovascular: Normal rate, regular rhythm and normal heart sounds.    No murmur heard. No carotid bruit .  No edema Pulmonary/Chest: Effort normal and breath sounds normal. No respiratory distress. No has no wheezes. No rales.  Skin: Skin is warm and dry. Not diaphoretic.  Psychiatric: Normal mood and affect. Behavior is normal.      Assessment & Plan:    See Problem List for Assessment and Plan of chronic medical problems.

## 2018-08-11 ENCOUNTER — Ambulatory Visit (INDEPENDENT_AMBULATORY_CARE_PROVIDER_SITE_OTHER): Payer: Medicare Other | Admitting: Internal Medicine

## 2018-08-11 ENCOUNTER — Encounter: Payer: Self-pay | Admitting: Internal Medicine

## 2018-08-11 VITALS — BP 122/74 | HR 73 | Temp 98.5°F | Resp 16 | Ht 66.0 in | Wt 231.0 lb

## 2018-08-11 DIAGNOSIS — R141 Gas pain: Secondary | ICD-10-CM | POA: Diagnosis not present

## 2018-08-11 DIAGNOSIS — G43001 Migraine without aura, not intractable, with status migrainosus: Secondary | ICD-10-CM | POA: Diagnosis not present

## 2018-08-11 DIAGNOSIS — I10 Essential (primary) hypertension: Secondary | ICD-10-CM

## 2018-08-11 DIAGNOSIS — K644 Residual hemorrhoidal skin tags: Secondary | ICD-10-CM

## 2018-08-11 DIAGNOSIS — K219 Gastro-esophageal reflux disease without esophagitis: Secondary | ICD-10-CM

## 2018-08-11 DIAGNOSIS — E7849 Other hyperlipidemia: Secondary | ICD-10-CM | POA: Diagnosis not present

## 2018-08-11 DIAGNOSIS — R142 Eructation: Secondary | ICD-10-CM

## 2018-08-11 DIAGNOSIS — J069 Acute upper respiratory infection, unspecified: Secondary | ICD-10-CM | POA: Diagnosis not present

## 2018-08-11 DIAGNOSIS — R143 Flatulence: Secondary | ICD-10-CM

## 2018-08-11 DIAGNOSIS — K5909 Other constipation: Secondary | ICD-10-CM

## 2018-08-11 DIAGNOSIS — E119 Type 2 diabetes mellitus without complications: Secondary | ICD-10-CM | POA: Diagnosis not present

## 2018-08-11 MED ORDER — FUROSEMIDE 40 MG PO TABS: 40.0000 mg | ORAL_TABLET | Freq: Two times a day (BID) | ORAL | 0 refills | Status: DC

## 2018-08-11 MED ORDER — LINACLOTIDE 145 MCG PO CAPS: 145.0000 ug | ORAL_CAPSULE | Freq: Every day | ORAL | 1 refills | Status: DC

## 2018-08-11 MED ORDER — SUMATRIPTAN-NAPROXEN SODIUM 85-500 MG PO TABS: 1.0000 | ORAL_TABLET | ORAL | 8 refills | Status: DC | PRN

## 2018-08-11 MED ORDER — HYDROCORTISONE 2.5 % RE CREA: TOPICAL_CREAM | RECTAL | 3 refills | Status: DC | PRN

## 2018-08-11 MED ORDER — ALBUTEROL SULFATE HFA 108 (90 BASE) MCG/ACT IN AERS: 2.0000 | INHALATION_SPRAY | Freq: Four times a day (QID) | RESPIRATORY_TRACT | 11 refills | Status: DC | PRN

## 2018-08-11 MED ORDER — METFORMIN HCL 500 MG PO TABS: 500.0000 mg | ORAL_TABLET | Freq: Every day | ORAL | 0 refills | Status: DC

## 2018-08-11 MED ORDER — SIMVASTATIN 40 MG PO TABS: 40.0000 mg | ORAL_TABLET | Freq: Every day | ORAL | 1 refills | Status: DC

## 2018-08-11 NOTE — Assessment & Plan Note (Signed)
Continue linzess - she does not want to increase her dose Increase exercise and water Try benefiber or metamucil

## 2018-08-11 NOTE — Assessment & Plan Note (Signed)
Taking metformin Check a1c Low sugar / carb diet Stressed regular exercise, weight loss

## 2018-08-11 NOTE — Patient Instructions (Addendum)
Start benefiber or metamucil 1- 2 times daily -- adjust as needed.7   Medications reviewed and updated.  Changes include :     Your prescription(s) have been submitted to your pharmacy. Please take as directed and contact our office if you believe you are having problem(s) with the medication(s).   Please followup in 6 months     Sinusitis, Adult Sinusitis is soreness and inflammation of your sinuses. Sinuses are hollow spaces in the bones around your face. Your sinuses are located:  Around your eyes.  In the middle of your forehead.  Behind your nose.  In your cheekbones.  Your sinuses and nasal passages are lined with a stringy fluid (mucus). Mucus normally drains out of your sinuses. When your nasal tissues become inflamed or swollen, the mucus can become trapped or blocked so air cannot flow through your sinuses. This allows bacteria, viruses, and funguses to grow, which leads to infection. Sinusitis can develop quickly and last for 7?10 days (acute) or for more than 12 weeks (chronic). Sinusitis often develops after a cold. What are the causes? This condition is caused by anything that creates swelling in the sinuses or stops mucus from draining, including:  Allergies.  Asthma.  Bacterial or viral infection.  Abnormally shaped bones between the nasal passages.  Nasal growths that contain mucus (nasal polyps).  Narrow sinus openings.  Pollutants, such as chemicals or irritants in the air.  A foreign object stuck in the nose.  A fungal infection. This is rare.  What increases the risk? The following factors may make you more likely to develop this condition:  Having allergies or asthma.  Having had a recent cold or respiratory tract infection.  Having structural deformities or blockages in your nose or sinuses.  Having a weak immune system.  Doing a lot of swimming or diving.  Overusing nasal sprays.  Smoking.  What are the signs or symptoms? The  main symptoms of this condition are pain and a feeling of pressure around the affected sinuses. Other symptoms include:  Upper toothache.  Earache.  Headache.  Bad breath.  Decreased sense of smell and taste.  A cough that may get worse at night.  Fatigue.  Fever.  Thick drainage from your nose. The drainage is often green and it may contain pus (purulent).  Stuffy nose or congestion.  Postnasal drip. This is when extra mucus collects in the throat or back of the nose.  Swelling and warmth over the affected sinuses.  Sore throat.  Sensitivity to light.  How is this diagnosed? This condition is diagnosed based on symptoms, a medical history, and a physical exam. To find out if your condition is acute or chronic, your health care provider may:  Look in your nose for signs of nasal polyps.  Tap over the affected sinus to check for signs of infection.  View the inside of your sinuses using an imaging device that has a light attached (endoscope).  If your health care provider suspects that you have chronic sinusitis, you may also:  Be tested for allergies.  Have a sample of mucus taken from your nose (nasal culture) and checked for bacteria.  Have a mucus sample examined to see if your sinusitis is related to an allergy.  If your sinusitis does not respond to treatment and it lasts longer than 8 weeks, you may have an MRI or CT scan to check your sinuses. These scans also help to determine how severe your infection is. In rare  cases, a bone biopsy may be done to rule out more serious types of fungal sinus disease. How is this treated? Treatment for sinusitis depends on the cause and whether your condition is chronic or acute. If a virus is causing your sinusitis, your symptoms will go away on their own within 10 days. You may be given medicines to relieve your symptoms, including:  Topical nasal decongestants. They shrink swollen nasal passages and let mucus drain from  your sinuses.  Antihistamines. These drugs block inflammation that is triggered by allergies. This can help to ease swelling in your nose and sinuses.  Topical nasal corticosteroids. These are nasal sprays that ease inflammation and swelling in your nose and sinuses.  Nasal saline washes. These rinses can help to get rid of thick mucus in your nose.  If your condition is caused by bacteria, you will be given an antibiotic medicine. If your condition is caused by a fungus, you will be given an antifungal medicine. Surgery may be needed to correct underlying conditions, such as narrow nasal passages. Surgery may also be needed to remove polyps. Follow these instructions at home: Medicines  Take, use, or apply over-the-counter and prescription medicines only as told by your health care provider. These may include nasal sprays.  If you were prescribed an antibiotic medicine, take it as told by your health care provider. Do not stop taking the antibiotic even if you start to feel better. Hydrate and Humidify  Drink enough water to keep your urine clear or pale yellow. Staying hydrated will help to thin your mucus.  Use a cool mist humidifier to keep the humidity level in your home above 50%.  Inhale steam for 10-15 minutes, 3-4 times a day or as told by your health care provider. You can do this in the bathroom while a hot shower is running.  Limit your exposure to cool or dry air. Rest  Rest as much as possible.  Sleep with your head raised (elevated).  Make sure to get enough sleep each night. General instructions  Apply a warm, moist washcloth to your face 3-4 times a day or as told by your health care provider. This will help with discomfort.  Wash your hands often with soap and water to reduce your exposure to viruses and other germs. If soap and water are not available, use hand sanitizer.  Do not smoke. Avoid being around people who are smoking (secondhand smoke).  Keep all  follow-up visits as told by your health care provider. This is important. Contact a health care provider if:  You have a fever.  Your symptoms get worse.  Your symptoms do not improve within 10 days. Get help right away if:  You have a severe headache.  You have persistent vomiting.  You have pain or swelling around your face or eyes.  You have vision problems.  You develop confusion.  Your neck is stiff.  You have trouble breathing. This information is not intended to replace advice given to you by your health care provider. Make sure you discuss any questions you have with your health care provider. Document Released: 09/27/2005 Document Revised: 05/23/2016 Document Reviewed: 07/23/2015 Elsevier Interactive Patient Education  [employer].

## 2018-08-11 NOTE — Assessment & Plan Note (Signed)
BP well controlled Current regimen effective and well tolerated Continue current medications at current doses  

## 2018-08-11 NOTE — Assessment & Plan Note (Signed)
anusol prn

## 2018-08-11 NOTE — Assessment & Plan Note (Signed)
Continue statin. 

## 2018-08-11 NOTE — Assessment & Plan Note (Signed)
Likely viral in nature No antibiotic at this time Continue allergy medication Symptomatic cold medications

## 2018-08-11 NOTE — Assessment & Plan Note (Addendum)
She has on average 1 migraine a month treximent effective Continue, renewed

## 2018-08-11 NOTE — Assessment & Plan Note (Signed)
GERD controlled Continue daily medication  

## 2018-08-28 ENCOUNTER — Encounter (INDEPENDENT_AMBULATORY_CARE_PROVIDER_SITE_OTHER): Payer: Self-pay | Admitting: Bariatrics

## 2018-08-28 ENCOUNTER — Ambulatory Visit (INDEPENDENT_AMBULATORY_CARE_PROVIDER_SITE_OTHER): Payer: Medicare Other | Admitting: Bariatrics

## 2018-08-28 VITALS — BP 115/72 | HR 66 | Temp 98.1°F | Ht 66.0 in | Wt 231.0 lb

## 2018-08-28 DIAGNOSIS — Z6837 Body mass index (BMI) 37.0-37.9, adult: Secondary | ICD-10-CM

## 2018-08-28 DIAGNOSIS — E559 Vitamin D deficiency, unspecified: Secondary | ICD-10-CM

## 2018-08-28 DIAGNOSIS — E119 Type 2 diabetes mellitus without complications: Secondary | ICD-10-CM

## 2018-08-28 DIAGNOSIS — E7849 Other hyperlipidemia: Secondary | ICD-10-CM | POA: Diagnosis not present

## 2018-08-28 MED ORDER — VITAMIN D (ERGOCALCIFEROL) 1.25 MG (50000 UNIT) PO CAPS: 50000.0000 [IU] | ORAL_CAPSULE | ORAL | 0 refills | Status: DC

## 2018-08-30 ENCOUNTER — Encounter: Payer: Self-pay | Admitting: Internal Medicine

## 2018-08-31 DIAGNOSIS — Z6837 Body mass index (BMI) 37.0-37.9, adult: Secondary | ICD-10-CM

## 2018-08-31 NOTE — Progress Notes (Signed)
Office: [phone]  /  Fax: [phone]   HPI:   Chief Complaint: OBESITY Nykerria is here to discuss her progress with her obesity treatment plan. She is on the Category 1 plan and is following her eating plan approximately 85 % of the time. She states she is exercising 0 minutes 0 times per week. Keosha is doing okay with the Category 1 plan. She is under a lot of stress. Nira is not doing any stress eating. Her weight is 231 lb (104.8 kg) today and she has maintained weight over a period of 3 weeks since her last visit. She has lost 10 lbs since starting treatment with [place].  Vitamin D deficiency Emrey has a diagnosis of vitamin D deficiency. She is currently taking high dose prescription vit D and denies nausea, vomiting or muscle weakness.  Hyperlipidemia Saliyah has hyperlipidemia and she is currently taking Zocor. She has been trying to improve her cholesterol levels with intensive lifestyle modification including a low saturated fat diet, exercise and weight loss. She denies myalgias.  Diabetes II without insulin Wilhemenia has a diagnosis of diabetes type II. Latitia is taking metformin and she denies any hypoglycemic episodes. Last A1c was at 6.2 She has been working on intensive lifestyle modifications including diet, exercise, and weight loss to help control her blood glucose levels.  ALLERGIES: Allergies  Allergen Reactions  . Codeine Nausea And Vomiting  . Guaifenesin-Codeine Other (See Comments)    REACTION: feels spacey  . Wellbutrin [Bupropion] Palpitations    hallucinations    MEDICATIONS: Current Outpatient Medications on File Prior to Visit  Medication Sig Dispense Refill  . albuterol (PROVENTIL HFA;VENTOLIN HFA) 108 (90 Base) MCG/ACT inhaler Inhale 2 puffs into the lungs every 6 (six) hours as needed for wheezing or shortness of breath. 1 Inhaler 11  . ALPRAZolam (XANAX) 0.5 MG tablet Take 1 tablet (0.5 mg total) by mouth 2 (two) times daily  as needed for anxiety. 60 tablet 1  . atenolol (TENORMIN) 25 MG tablet Take 59m (1 tablet) in the AM and 574m(2 tablets) in the PM 270 tablet 0  . baclofen (LIORESAL) 10 MG tablet baclofen 10 mg tablet  Take 1 tablet 3 times a day by oral route.    . blood glucose meter kit and supplies KIT Dispense based on patient and insurance preference. Use up to four times daily as directed. (FOR ICD-9 250.00, 250.01). 1 each 0  . ciclopirox (PENLAC) 8 % solution Apply topically at bedtime. Apply over nail & surrounding skin. Apply QD over previous coat.After7 days, may remove w alcohol, repeat 6.6 mL 3  . fluticasone (FLONASE) 50 MCG/ACT nasal spray Place 2 sprays into both nostrils daily. 48 g 11  . furosemide (LASIX) 40 MG tablet Take 1 tablet (40 mg total) by mouth 2 (two) times daily. 180 tablet 0  . hydrocortisone (ANUSOL-HC) 2.5 % rectal cream Place rectally as needed. 30 g 3  . levocetirizine (XYZAL) 5 MG tablet Take 1 tablet (5 mg total) by mouth every evening. -- Office visit needed for further refills 90 tablet 0  . linaclotide (LINZESS) 145 MCG CAPS capsule Take 1 capsule (145 mcg total) by mouth daily. 90 capsule 1  . meclizine (ANTIVERT) 25 MG tablet Take 1 tablet (25 mg total) by mouth 3 (three) times daily as needed for dizziness or nausea. 180 tablet 3  . metFORMIN (GLUCOPHAGE) 500 MG tablet Take 1 tablet (500 mg total) by mouth daily with breakfast. 90 tablet 0  .  Multiple Vitamins-Minerals (MULTIVITAMIN WITH MINERALS) tablet Take 1 tablet by mouth daily.    Marland Kitchen omeprazole (PRILOSEC) 20 MG capsule Take 1 capsule (20 mg total) by mouth daily. 90 capsule 1  . potassium chloride SA (K-DUR,KLOR-CON) 20 MEQ tablet Take 1 tablet (20 mEq total) by mouth 2 (two) times daily. 180 tablet 1  . simvastatin (ZOCOR) 40 MG tablet Take 1 tablet (40 mg total) by mouth daily at 6 PM. 90 tablet 1  . SUMAtriptan-naproxen (TREXIMET) 85-500 MG tablet Take 1 tablet by mouth every 2 (two) hours as needed for  migraine. 10 tablet 8  . promethazine (PHENERGAN) 25 MG tablet Take 1 tablet (25 mg total) by mouth every 8 (eight) hours as needed for up to 7 days for nausea or vomiting (related to dizziness). 30 tablet 2  . [DISCONTINUED] oxycodone (OXY-IR) 5 MG capsule Take 5 mg by mouth every 4 (four) hours as needed. For pain.     No current facility-administered medications on file prior to visit.     PAST MEDICAL HISTORY: Past Medical History:  Diagnosis Date  . Abnormal stress test    a. 09/2016: NST showed a small defect of mild severity present in the basal inferoseptal and mid inferoseptal location, consistent with ischemia. --> medically managed  . ALLERGIC RHINITIS   . Anxiety   . Back pain   . Bursitis   . Constipation   . DDD (degenerative disc disease), lumbar    ESI with [facility] (spring 2016)  . Depression   . Diabetes mellitus without complication ([place])   . Dry mouth   . Dyslipidemia   . Easy bruising   . External hemorrhoids   . Fatigue   . GERD (gastroesophageal reflux disease)   . Hay fever   . Hypertension   . Knee pain   . Leg cramping   . Muscle stiffness   . Nervousness   . Obesity   . Osteoarthritis   . Palpitations    a. prior event monitor showing sinus tachycardia, no PAF.   Marland Kitchen Panic attacks    Hx of depression  . Renal disorder   . Sinus pain   . Sleep apnea    CPAP hs  . Stress   . Trouble in sleeping     PAST SURGICAL HISTORY: Past Surgical History:  Procedure Laterality Date  . ABDOMINAL HYSTERECTOMY    . MASS EXCISION Left 12/30/2015   Procedure: EXCISION LEFT LEG MASS;  Surgeon: Donnie Mesa, MD;  Location: [facility];  Service: General;  Laterality: Left;  . REPLACEMENT TOTAL KNEE Right   . RIGHT OOPHORECTOMY     No cancer    SOCIAL HISTORY: Social History   Tobacco Use  . Smoking status: Never Smoker  . Smokeless tobacco: Never Used  Substance Use Topics  . Alcohol use: Yes    Alcohol/week: 1.0 standard drinks     Types: 1 Glasses of wine per week    Comment: one drink a month  . Drug use: No    FAMILY HISTORY: Family History  Problem Relation Age of Onset  . Heart disease Mother        Enlarged Heart, Pacemaker  . Diabetes Mother   . Thyroid disease Mother   . Hyperlipidemia Mother   . Hypertension Mother   . Sleep apnea Mother   . Dementia Father   . Kidney failure Father   . Prostate cancer Father   . Alcoholism Father   . Asthma Other   .  Allergies Sister   . Asthma Son   . Breast cancer Maternal Aunt        cousin  . Colon cancer Cousin   . Heart disease Son        CHF, morbid obesity  . Prostate cancer Maternal Uncle   . Diabetes Son     ROS: Review of Systems  Constitutional: Negative for weight loss.  Gastrointestinal: Negative for nausea and vomiting.  Musculoskeletal: Negative for myalgias.       Negative for muscle weakness  Endo/Heme/Allergies:       Negative for hypoglycemia    PHYSICAL EXAM: Blood pressure 115/72, pulse 66, temperature 98.1 F (36.7 C), temperature source Oral, height 5' 6" (1.676 m), weight 231 lb (104.8 kg), SpO2 99 %. Body mass index is 37.28 kg/m. Physical Exam  Constitutional: She is oriented to person, place, and time. She appears well-developed and well-nourished.  Cardiovascular: Normal rate.  Pulmonary/Chest: Effort normal.  Musculoskeletal: Normal range of motion.  Neurological: She is oriented to person, place, and time.  Skin: Skin is warm and dry.  Psychiatric: She has a normal mood and affect. Her behavior is normal.  Vitals reviewed.   RECENT LABS AND TESTS: BMET    Component Value Date/Time   NA 141 07/05/2018 1200   K 4.2 07/05/2018 1200   CL 101 07/05/2018 1200   CO2 24 07/05/2018 1200   GLUCOSE 97 07/05/2018 1200   GLUCOSE 96 09/27/2017 1204   GLUCOSE 89 09/20/2006 0915   BUN 16 07/05/2018 1200   CREATININE 0.72 07/05/2018 1200   CALCIUM 9.7 07/05/2018 1200   GFRNONAA 89 07/05/2018 1200   GFRAA 102  07/05/2018 1200   Lab Results  Component Value Date   HGBA1C 6.2 (H) 07/05/2018   HGBA1C 6.2 (H) 02/09/2018   HGBA1C 6.4 09/27/2017   HGBA1C 6.4 03/28/2017   HGBA1C 6.2 09/27/2016   Lab Results  Component Value Date   INSULIN 15.7 07/05/2018   INSULIN 23.1 02/09/2018   CBC    Component Value Date/Time   WBC 6.6 02/09/2018 1053   WBC 7.2 05/07/2016 1229   RBC 4.46 02/09/2018 1053   RBC 4.45 05/07/2016 1229   HGB 12.4 02/09/2018 1053   HCT 38.6 02/09/2018 1053   PLT 249.0 05/07/2016 1229   MCV 87 02/09/2018 1053   MCH 27.8 02/09/2018 1053   MCHC 32.1 02/09/2018 1053   MCHC 33.1 05/07/2016 1229   RDW 15.4 02/09/2018 1053   LYMPHSABS 1.5 02/09/2018 1053   MONOABS 0.5 05/07/2016 1229   EOSABS 0.1 02/09/2018 1053   BASOSABS 0.0 02/09/2018 1053   Iron/TIBC/Ferritin/ %Sat No results found for: IRON, TIBC, FERRITIN, IRONPCTSAT Lipid Panel     Component Value Date/Time   CHOL 183 02/09/2018 1053   TRIG 99 02/09/2018 1053   TRIG 51 09/20/2006 0915   HDL 55 02/09/2018 1053   CHOLHDL 4 09/27/2017 1204   VLDL 26.4 09/27/2017 1204   LDLCALC 108 (H) 02/09/2018 1053   Hepatic Function Panel     Component Value Date/Time   PROT 7.0 07/05/2018 1200   ALBUMIN 4.5 07/05/2018 1200   AST 9 07/05/2018 1200   ALT 9 07/05/2018 1200   ALKPHOS 149 (H) 07/05/2018 1200   BILITOT 0.2 07/05/2018 1200   BILIDIR 0.1 04/22/2014 1254      Component Value Date/Time   TSH 2.020 02/09/2018 1053   TSH 2.39 09/27/2016 1608   TSH 1.74 03/23/2016 0953   Results for Leavy, Alys D "HOLLIS" (MRN  [id number]) as of 08/31/2018 11:19  Ref. Range 07/05/2018 12:00  Vitamin D, 25-Hydroxy Latest Ref Range: 30.0 - 100.0 ng/mL 46.0   ASSESSMENT AND PLAN: Vitamin D deficiency - Plan: Vitamin D, Ergocalciferol, (DRISDOL) 1.25 MG (50000 UT) CAPS capsule  Other hyperlipidemia  Type 2 diabetes mellitus without complication, without long-term current use of insulin (HCC)  At risk for heart  disease  Class 2 severe obesity with serious comorbidity and body mass index (BMI) of 37.0 to 37.9 in adult, unspecified obesity type ([place])  PLAN:  Vitamin D Deficiency Shamaine was informed that low vitamin D levels contributes to fatigue and are associated with obesity, breast, and colon cancer. She agrees to continue to take prescription Vit D _0 ,000 IU every week #4 with no refills and will follow up for routine testing of vitamin D, at least 2-3 times per year. She was informed of the risk of over-replacement of vitamin D and agrees to not increase her dose unless she discusses this with [place] first. Yarelie agrees to follow up as directed.  Hyperlipidemia Rayni was informed of the American Heart Association Guidelines emphasizing intensive lifestyle modifications as the first line treatment for hyperlipidemia. We discussed many lifestyle modifications today in depth, and Kaelynne will continue to work on decreasing saturated fats such as fatty red meat, butter and many fried foods. She will also increase vegetables and lean protein in her diet and continue to work on exercise and weight loss efforts. Srishti will continue to take Zocor and will follow up as directed.  Diabetes II without insulin Shemaiah has been given extensive diabetes education by myself today including ideal fasting and post-prandial blood glucose readings, individual ideal Hgb A1c goals and hypoglycemia prevention. We discussed the importance of good blood sugar control to decrease the likelihood of diabetic complications such as nephropathy, neuropathy, limb loss, blindness, coronary artery disease, and death. We discussed the importance of intensive lifestyle modification including diet, exercise and weight loss as the first line treatment for diabetes. Tula will continue metformin and will follow up at the agreed upon time.  Obesity Kasy is currently in the action stage of change. As such, her goal  is to continue with weight loss efforts She has agreed to follow the Category 1 plan Deissy has been instructed to work up to a goal of 150 minutes of combined cardio and strengthening exercise per week for weight loss and overall health benefits. We discussed the following Behavioral Modification Strategies today: increase H2O intake, no skipping meals, increasing lean protein intake, decreasing simple carbohydrates  and increasing vegetables  Handout for "Thanksgiving" was given to patient today.  Frederika has agreed to follow up with our clinic in 3 weeks. She was informed of the importance of frequent follow up visits to maximize her success with intensive lifestyle modifications for her multiple health conditions.   OBESITY BEHAVIORAL INTERVENTION VISIT  Today's visit was # 9   Starting weight: 241 lbs Starting date: 02/09/2018 Today's weight : 231 lbs Today's date: 08/28/2018 Total lbs lost to date: 10 At least 15 minutes were spent on discussing the following behavioral intervention visit.   ASK: We discussed the diagnosis of obesity with [name] today and Dachelle agreed to give [place] permission to discuss obesity behavioral modification therapy today.  ASSESS: Joseph has the diagnosis of obesity and her BMI today is 49.3 Sarabeth is in the action stage of change   ADVISE: Daphane was educated on the multiple health risks of obesity as well  as the benefit of weight loss to improve her health. She was advised of the need for long term treatment and the importance of lifestyle modifications to improve her current health and to decrease her risk of future health problems.  AGREE: Multiple dietary modification options and treatment options were discussed and  Shelba agreed to follow the recommendations documented in the above note.  ARRANGE: Zya was educated on the importance of frequent visits to treat obesity as outlined per CMS and USPSTF  guidelines and agreed to schedule her next follow up appointment today.  I, Joanne Murray, am acting as transcriptionist for Angel A. Brown, DO  I have reviewed the above documentation for accuracy and completeness, and I agree with the above. -Angel Brown, DO    

## 2018-09-18 ENCOUNTER — Encounter (INDEPENDENT_AMBULATORY_CARE_PROVIDER_SITE_OTHER): Payer: Self-pay

## 2018-09-18 ENCOUNTER — Ambulatory Visit (INDEPENDENT_AMBULATORY_CARE_PROVIDER_SITE_OTHER): Payer: Self-pay | Admitting: Bariatrics

## 2018-09-20 DIAGNOSIS — F410 Panic disorder [episodic paroxysmal anxiety] without agoraphobia: Secondary | ICD-10-CM | POA: Diagnosis not present

## 2018-09-20 DIAGNOSIS — F332 Major depressive disorder, recurrent severe without psychotic features: Secondary | ICD-10-CM | POA: Diagnosis not present

## 2018-09-20 DIAGNOSIS — F411 Generalized anxiety disorder: Secondary | ICD-10-CM | POA: Diagnosis not present

## 2018-10-17 ENCOUNTER — Ambulatory Visit: Payer: Medicare Other | Admitting: Internal Medicine

## 2018-10-17 DIAGNOSIS — Z0289 Encounter for other administrative examinations: Secondary | ICD-10-CM

## 2018-10-19 ENCOUNTER — Encounter (INDEPENDENT_AMBULATORY_CARE_PROVIDER_SITE_OTHER): Payer: Self-pay

## 2018-10-19 ENCOUNTER — Ambulatory Visit (INDEPENDENT_AMBULATORY_CARE_PROVIDER_SITE_OTHER): Payer: Medicare Other | Admitting: Internal Medicine

## 2018-10-19 ENCOUNTER — Encounter: Payer: Self-pay | Admitting: Internal Medicine

## 2018-10-19 VITALS — BP 118/72 | HR 61 | Temp 98.5°F | Resp 16 | Ht 66.0 in | Wt 238.8 lb

## 2018-10-19 DIAGNOSIS — J301 Allergic rhinitis due to pollen: Secondary | ICD-10-CM | POA: Diagnosis not present

## 2018-10-19 DIAGNOSIS — R229 Localized swelling, mass and lump, unspecified: Secondary | ICD-10-CM

## 2018-10-19 DIAGNOSIS — E119 Type 2 diabetes mellitus without complications: Secondary | ICD-10-CM | POA: Diagnosis not present

## 2018-10-19 MED ORDER — LEVOCETIRIZINE DIHYDROCHLORIDE 5 MG PO TABS: 5.0000 mg | ORAL_TABLET | Freq: Every evening | ORAL | 1 refills | Status: DC

## 2018-10-19 MED ORDER — FUROSEMIDE 40 MG PO TABS: 40.0000 mg | ORAL_TABLET | Freq: Two times a day (BID) | ORAL | 1 refills | Status: DC

## 2018-10-19 MED ORDER — OMEPRAZOLE 20 MG PO CPDR: 20.0000 mg | DELAYED_RELEASE_CAPSULE | Freq: Every day | ORAL | 1 refills | Status: DC

## 2018-10-19 NOTE — Progress Notes (Signed)
Subjective:    Patient ID: Erica [name], female    DOB: [birth year], 63 y.o.   MRN: [id number]  HPI The patient is here for an acute visit.  Not recessed in left abdomen: Approximately 1 week ago she noticed a bruise and soreness in the left side of her abdomen.  She does not recall hitting the area.  She then noticed there is a cyst or a lump in that area.  She denies any obvious change in size.  She denies any pain at this time.  Diabetes: She would like to see a podiatrist for her diabetes.  Medications and allergies reviewed with patient and updated if appropriate.  Patient Active Problem List   Diagnosis Date Noted  . Class 2 severe obesity with serious comorbidity and body mass index (BMI) of 37.0 to 37.9 in adult ([place]) 08/31/2018  . Onychomycosis 12/07/2017  . Nausea without vomiting 12/07/2017  . Left hip pain 06/12/2017  . Acute left-sided low back pain without sciatica 06/12/2017  . Meningioma ([place]) 03/28/2017  . Panic attacks 09/27/2016  . GERD (gastroesophageal reflux disease) 09/27/2016  . Grief reaction 08/23/2016  . Right ankle pain 08/19/2016  . Diabetes ([place]) 02/15/2016  . Skin nodule 11/28/2015  . Migraine without aura and with status migrainosus, not intractable 10/03/2015  . Numbness of left hand 10/03/2015  . Cephalalgia 10/03/2015  . Abnormal ECG 09/11/2012  . Palpitations 09/08/2012  . Sciatica of left side   . Peripheral edema 05/09/2012  . FATIGUE 02/23/2010  . Obstructive sleep apnea 10/31/2009  . External hemorrhoids 10/17/2009  . CONSTIPATION, CHRONIC 10/17/2009  . Hyperlipidemia 01/12/2008  . Depression, major, recurrent ([place]) 01/12/2008  . Essential hypertension, benign 01/12/2008  . Allergic rhinitis 01/12/2008  . Acute URI 11/20/2007    Current Outpatient Medications on File Prior to Visit  Medication Sig Dispense Refill  . albuterol (PROVENTIL HFA;VENTOLIN HFA) 108 (90 Base) MCG/ACT inhaler Inhale 2 puffs into the lungs every 6  (six) hours as needed for wheezing or shortness of breath. 1 Inhaler 11  . ALPRAZolam (XANAX) 0.5 MG tablet Take 1 tablet (0.5 mg total) by mouth 2 (two) times daily as needed for anxiety. 60 tablet 1  . atenolol (TENORMIN) 25 MG tablet Take 60m (1 tablet) in the AM and 558m(2 tablets) in the PM 270 tablet 0  . baclofen (LIORESAL) 10 MG tablet baclofen 10 mg tablet  Take 1 tablet 3 times a day by oral route.    . blood glucose meter kit and supplies KIT Dispense based on patient and insurance preference. Use up to four times daily as directed. (FOR ICD-9 250.00, 250.01). 1 each 0  . ciclopirox (PENLAC) 8 % solution Apply topically at bedtime. Apply over nail & surrounding skin. Apply QD over previous coat.After7 days, may remove w alcohol, repeat 6.6 mL 3  . fluticasone (FLONASE) 50 MCG/ACT nasal spray Place 2 sprays into both nostrils daily. 48 g 11  . hydrocortisone (ANUSOL-HC) 2.5 % rectal cream Place rectally as needed. 30 g 3  . linaclotide (LINZESS) 145 MCG CAPS capsule Take 1 capsule (145 mcg total) by mouth daily. 90 capsule 1  . meclizine (ANTIVERT) 25 MG tablet Take 1 tablet (25 mg total) by mouth 3 (three) times daily as needed for dizziness or nausea. 180 tablet 3  . metFORMIN (GLUCOPHAGE) 500 MG tablet Take 1 tablet (500 mg total) by mouth daily with breakfast. 90 tablet 0  . Multiple Vitamins-Minerals (MULTIVITAMIN WITH MINERALS) tablet Take  1 tablet by mouth daily.    . potassium chloride SA (K-DUR,KLOR-CON) 20 MEQ tablet Take 1 tablet (20 mEq total) by mouth 2 (two) times daily. 180 tablet 1  . simvastatin (ZOCOR) 40 MG tablet Take 1 tablet (40 mg total) by mouth daily at 6 PM. 90 tablet 1  . SUMAtriptan-naproxen (TREXIMET) 85-500 MG tablet Take 1 tablet by mouth every 2 (two) hours as needed for migraine. 10 tablet 8  . Vitamin D, Ergocalciferol, (DRISDOL) 1.25 MG (50000 UT) CAPS capsule Take 1 capsule (50,000 Units total) by mouth every 7 (seven) days. 4 capsule 0  . promethazine  (PHENERGAN) 25 MG tablet Take 1 tablet (25 mg total) by mouth every 8 (eight) hours as needed for up to 7 days for nausea or vomiting (related to dizziness). 30 tablet 2  . [DISCONTINUED] oxycodone (OXY-IR) 5 MG capsule Take 5 mg by mouth every 4 (four) hours as needed. For pain.     No current facility-administered medications on file prior to visit.     Past Medical History:  Diagnosis Date  . Abnormal stress test    a. 09/2016: NST showed a small defect of mild severity present in the basal inferoseptal and mid inferoseptal location, consistent with ischemia. --> medically managed  . ALLERGIC RHINITIS   . Anxiety   . Back pain   . Bursitis   . Constipation   . DDD (degenerative disc disease), lumbar    ESI with Ramos (spring 2016)  . Depression   . Diabetes mellitus without complication ([place])   . Dry mouth   . Dyslipidemia   . Easy bruising   . External hemorrhoids   . Fatigue   . GERD (gastroesophageal reflux disease)   . Hay fever   . Hypertension   . Knee pain   . Leg cramping   . Muscle stiffness   . Nervousness   . Obesity   . Osteoarthritis   . Palpitations    a. prior event monitor showing sinus tachycardia, no PAF.   Marland Kitchen Panic attacks    Hx of depression  . Renal disorder   . Sinus pain   . Sleep apnea    CPAP hs  . Stress   . Trouble in sleeping     Past Surgical History:  Procedure Laterality Date  . ABDOMINAL HYSTERECTOMY    . MASS EXCISION Left 12/30/2015   Procedure: EXCISION LEFT LEG MASS;  Surgeon: Donnie Mesa, MD;  Location: [facility];  Service: General;  Laterality: Left;  . REPLACEMENT TOTAL KNEE Right   . RIGHT OOPHORECTOMY     No cancer    Social History   Socioeconomic History  . Marital status: Married    Spouse name: [name]  . Number of children: 3  . Years of education: Not on file  . Highest education level: Not on file  Occupational History  . Occupation: Disabled  Social Needs  . Financial resource strain:  Not on file  . Food insecurity:    Worry: Not on file    Inability: Not on file  . Transportation needs:    Medical: Not on file    Non-medical: Not on file  Tobacco Use  . Smoking status: Never Smoker  . Smokeless tobacco: Never Used  Substance and Sexual Activity  . Alcohol use: Yes    Alcohol/week: 1.0 standard drinks    Types: 1 Glasses of wine per week    Comment: one drink a month  . Drug  use: No  . Sexual activity: Not Currently  Lifestyle  . Physical activity:    Days per week: Not on file    Minutes per session: Not on file  . Stress: Not on file  Relationships  . Social connections:    Talks on phone: Not on file    Gets together: Not on file    Attends religious service: Not on file    Active member of club or organization: Not on file    Attends meetings of clubs or organizations: Not on file    Relationship status: Not on file  Other Topics Concern  . Not on file  Social History Narrative   Married with children. Pt is on disablity. Previous worked in the school system    Family History  Problem Relation Age of Onset  . Heart disease Mother        Enlarged Heart, Pacemaker  . Diabetes Mother   . Thyroid disease Mother   . Hyperlipidemia Mother   . Hypertension Mother   . Sleep apnea Mother   . Dementia Father   . Kidney failure Father   . Prostate cancer Father   . Alcoholism Father   . Asthma Other   . Allergies Sister   . Asthma Son   . Breast cancer Maternal Aunt        cousin  . Colon cancer Cousin   . Heart disease Son        CHF, morbid obesity  . Prostate cancer Maternal Uncle   . Diabetes Son     Review of Systems  Constitutional: Negative for chills and fever.  Skin: Positive for color change. Negative for wound.       Objective:   Vitals:   10/19/18 1127  BP: 118/72  Pulse: 61  Resp: 16  Temp: 98.5 F (36.9 C)  SpO2: 97%   BP Readings from Last 3 Encounters:  10/19/18 118/72  08/28/18 115/72  08/11/18 122/74    Wt Readings from Last 3 Encounters:  10/19/18 238 lb 12.8 oz (108.3 kg)  08/28/18 231 lb (104.8 kg)  08/11/18 231 lb (104.8 kg)   Body mass index is 38.54 kg/m.   Physical Exam    Constitutional: Appears well-developed and well-nourished. No distress.  Skin: Skin is warm and dry. Not diaphoretic.  Residual bruise the size of a tangerine left side of abdomen that is nontender.  No wound.  She has a couple of palpable lumps in that area that are nontender.      Assessment & Plan:    See Problem List for Assessment and Plan of chronic medical problems.

## 2018-10-19 NOTE — Patient Instructions (Addendum)
The lump under your skin is likely either from the bruise or a lipoma.   Medications reviewed and updated.  Changes include :   none  Your prescription(s) have been submitted to your pharmacy. Please take as directed and contact our office if you believe you are having problem(s) with the medication(s).  A referral was ordered for podiatry - dr Daylene Katayama

## 2018-10-19 NOTE — Assessment & Plan Note (Signed)
Left side of abdomen-2 lumps that are nontender and in an area where there is a bruise Discussed with her that this could be related to the trauma and possible hematoma under the skin Can also be a lipoma that she did not realize was there Discussed that this is benign She will monitor and we can follow-up at her next exam

## 2018-10-19 NOTE — Assessment & Plan Note (Signed)
Referred to podiatry.

## 2018-11-07 ENCOUNTER — Ambulatory Visit (INDEPENDENT_AMBULATORY_CARE_PROVIDER_SITE_OTHER): Payer: Medicare Other | Admitting: Podiatry

## 2018-11-07 ENCOUNTER — Encounter: Payer: Self-pay | Admitting: Podiatry

## 2018-11-07 DIAGNOSIS — B353 Tinea pedis: Secondary | ICD-10-CM | POA: Diagnosis not present

## 2018-11-07 DIAGNOSIS — M79675 Pain in left toe(s): Secondary | ICD-10-CM | POA: Diagnosis not present

## 2018-11-07 DIAGNOSIS — L84 Corns and callosities: Secondary | ICD-10-CM | POA: Diagnosis not present

## 2018-11-07 DIAGNOSIS — E1142 Type 2 diabetes mellitus with diabetic polyneuropathy: Secondary | ICD-10-CM

## 2018-11-07 DIAGNOSIS — M79674 Pain in right toe(s): Secondary | ICD-10-CM

## 2018-11-07 DIAGNOSIS — B351 Tinea unguium: Secondary | ICD-10-CM | POA: Diagnosis not present

## 2018-11-07 MED ORDER — KETOCONAZOLE 2 % EX CREA: 1.0000 "application " | TOPICAL_CREAM | Freq: Every day | CUTANEOUS | 1 refills | Status: DC

## 2018-11-07 NOTE — Patient Instructions (Signed)
Diabetes Mellitus and Foot Care  Foot care is an important part of your health, especially when you have diabetes. Diabetes may cause you to have problems because of poor blood flow (circulation) to your feet and legs, which can cause your skin to:   Become thinner and drier.   Break more easily.   Heal more slowly.   Peel and crack.  You may also have nerve damage (neuropathy) in your legs and feet, causing decreased feeling in them. This means that you may not notice minor injuries to your feet that could lead to more serious problems. Noticing and addressing any potential problems early is the best way to prevent future foot problems.  How to care for your feet  Foot hygiene   Wash your feet daily with warm water and mild soap. Do not use hot water. Then, pat your feet and the areas between your toes until they are completely dry. Do not soak your feet as this can dry your skin.   Trim your toenails straight across. Do not dig under them or around the cuticle. File the edges of your nails with an emery board or nail file.   Apply a moisturizing lotion or petroleum jelly to the skin on your feet and to dry, brittle toenails. Use lotion that does not contain alcohol and is unscented. Do not apply lotion between your toes.  Shoes and socks   Wear clean socks or stockings every day. Make sure they are not too tight. Do not wear knee-high stockings since they may decrease blood flow to your legs.   Wear shoes that fit properly and have enough cushioning. Always look in your shoes before you put them on to be sure there are no objects inside.   To break in new shoes, wear them for just a few hours a day. This prevents injuries on your feet.  Wounds, scrapes, corns, and calluses   Check your feet daily for blisters, cuts, bruises, sores, and redness. If you cannot see the bottom of your feet, use a mirror or ask someone for help.   Do not cut corns or calluses or try to remove them with medicine.   If you  find a minor scrape, cut, or break in the skin on your feet, keep it and the skin around it clean and dry. You may clean these areas with mild soap and water. Do not clean the area with peroxide, alcohol, or iodine.   If you have a wound, scrape, corn, or callus on your foot, look at it several times a day to make sure it is healing and not infected. Check for:  ? Redness, swelling, or pain.  ? Fluid or blood.  ? Warmth.  ? Pus or a bad smell.  General instructions   Do not cross your legs. This may decrease blood flow to your feet.   Do not use heating pads or hot water bottles on your feet. They may burn your skin. If you have lost feeling in your feet or legs, you may not know this is happening until it is too late.   Protect your feet from hot and cold by wearing shoes, such as at the beach or on hot pavement.   Schedule a complete foot exam at least once a year (annually) or more often if you have foot problems. If you have foot problems, report any cuts, sores, or bruises to your health care provider immediately.  Contact a health care provider if:     You have a medical condition that increases your risk of infection and you have any cuts, sores, or bruises on your feet.   You have an injury that is not healing.   You have redness on your legs or feet.   You feel burning or tingling in your legs or feet.   You have pain or cramps in your legs and feet.   Your legs or feet are numb.   Your feet always feel cold.   You have pain around a toenail.  Get help right away if:   You have a wound, scrape, corn, or callus on your foot and:  ? You have pain, swelling, or redness that gets worse.  ? You have fluid or blood coming from the wound, scrape, corn, or callus.  ? Your wound, scrape, corn, or callus feels warm to the touch.  ? You have pus or a bad smell coming from the wound, scrape, corn, or callus.  ? You have a fever.  ? You have a red line going up your leg.  Summary   Check your feet every day  for cuts, sores, red spots, swelling, and blisters.   Moisturize feet and legs daily.   Wear shoes that fit properly and have enough cushioning.   If you have foot problems, report any cuts, sores, or bruises to your health care provider immediately.   Schedule a complete foot exam at least once a year (annually) or more often if you have foot problems.  This information is not intended to replace advice given to you by your health care provider. Make sure you discuss any questions you have with your health care provider.  Document Released: 09/24/2000 Document Revised: 11/09/2017 Document Reviewed: 10/29/2016  Elsevier Interactive Patient Education  2019 [employer]

## 2018-11-07 NOTE — Progress Notes (Signed)
Subjective: Erica [name] presents today with painful, thick toenails 1-5 b/l that she cannot cut and which interfere with daily activities.  Pain is aggravated when wearing enclosed shoe gear.  [name], MD is her PCP and last DOS was 10/19/2018.   Current Outpatient Medications:  .  albuterol (PROVENTIL HFA;VENTOLIN HFA) 108 (90 Base) MCG/ACT inhaler, Inhale 2 puffs into the lungs every 6 (six) hours as needed for wheezing or shortness of breath., Disp: 1 Inhaler, Rfl: 11 .  ALPRAZolam (XANAX) 0.5 MG tablet, Take 1 tablet (0.5 mg total) by mouth 2 (two) times daily as needed for anxiety., Disp: 60 tablet, Rfl: 1 .  atenolol (TENORMIN) 25 MG tablet, Take 45m (1 tablet) in the AM and 590m(2 tablets) in the PM, Disp: 270 tablet, Rfl: 0 .  baclofen (LIORESAL) 10 MG tablet, baclofen 10 mg tablet  Take 1 tablet 3 times a day by oral route., Disp: , Rfl:  .  blood glucose meter kit and supplies KIT, Dispense based on patient and insurance preference. Use up to four times daily as directed. (FOR ICD-9 250.00, 250.01)., Disp: 1 each, Rfl: 0 .  ciclopirox (PENLAC) 8 % solution, Apply topically at bedtime. Apply over nail & surrounding skin. Apply QD over previous coat.After7 days, may remove w alcohol, repeat, Disp: 6.6 mL, Rfl: 3 .  fluticasone (FLONASE) 50 MCG/ACT nasal spray, Place 2 sprays into both nostrils daily., Disp: 48 g, Rfl: 11 .  furosemide (LASIX) 40 MG tablet, Take 1 tablet (40 mg total) by mouth 2 (two) times daily., Disp: 180 tablet, Rfl: 1 .  HYDROcodone-acetaminophen (NORCO) 5-325 MG tablet, Norco 5 mg-325 mg tablet  Take 1 tablet 3 times a day by oral route as needed., Disp: , Rfl:  .  hydrocortisone (ANUSOL-HC) 2.5 % rectal cream, Place rectally as needed., Disp: 30 g, Rfl: 3 .  levocetirizine (XYZAL) 5 MG tablet, Take 1 tablet (5 mg total) by mouth every evening. -- Office visit needed for further refills, Disp: 90 tablet, Rfl: 1 .  linaclotide (LINZESS) 145 MCG CAPS  capsule, Take 1 capsule (145 mcg total) by mouth daily., Disp: 90 capsule, Rfl: 1 .  meclizine (ANTIVERT) 25 MG tablet, Take 1 tablet (25 mg total) by mouth 3 (three) times daily as needed for dizziness or nausea., Disp: 180 tablet, Rfl: 3 .  metFORMIN (GLUCOPHAGE) 500 MG tablet, Take 1 tablet (500 mg total) by mouth daily with breakfast., Disp: 90 tablet, Rfl: 0 .  Multiple Vitamins-Minerals (MULTIVITAMIN WITH MINERALS) tablet, Take 1 tablet by mouth daily., Disp: , Rfl:  .  omeprazole (PRILOSEC) 20 MG capsule, Take 1 capsule (20 mg total) by mouth daily., Disp: 90 capsule, Rfl: 1 .  potassium chloride SA (K-DUR,KLOR-CON) 20 MEQ tablet, Take 1 tablet (20 mEq total) by mouth 2 (two) times daily., Disp: 180 tablet, Rfl: 1 .  simvastatin (ZOCOR) 40 MG tablet, Take 1 tablet (40 mg total) by mouth daily at 6 PM., Disp: 90 tablet, Rfl: 1 .  SUMAtriptan-naproxen (TREXIMET) 85-500 MG tablet, Take 1 tablet by mouth every 2 (two) hours as needed for migraine., Disp: 10 tablet, Rfl: 8 .  Vitamin D, Ergocalciferol, (DRISDOL) 1.25 MG (50000 UT) CAPS capsule, Take 1 capsule (50,000 Units total) by mouth every 7 (seven) days., Disp: 4 capsule, Rfl: 0 .  ketoconazole (NIZORAL) 2 % cream, Apply 1 application topically daily. Apply to both feet and between toes once daily for 6 weeks., Disp: 30 g, Rfl: 1 .  promethazine (  PHENERGAN) 25 MG tablet, Take 1 tablet (25 mg total) by mouth every 8 (eight) hours as needed for up to 7 days for nausea or vomiting (related to dizziness)., Disp: 30 tablet, Rfl: 2  Allergies  Allergen Reactions  . Codeine Nausea And Vomiting  . Guaifenesin-Codeine Other (See Comments)    REACTION: feels spacey  . Wellbutrin [Bupropion] Palpitations    hallucinations    Objective:  Vascular Examination: Capillary refill time immediate x 10 digits  Dorsalis pedis and Posterior tibial pulses palpable b/l  Digital hair present x 10 digits  Skin temperature gradient WNL  b/l  Dermatological Examination: Skin with normal turgor, texture and tone b/l  Toenails 1-5 b/l discolored, thick, dystrophic with subungual debris and pain with palpation to nailbeds due to thickness of nails.  Hyperkeratotic lesions noted medial left great toe and right great toe.  There is no erythema, no edema, no drainage, no flocculence noted.  Diffuse scaling noted peripherally and plantarly b/l feet with mild foot odor.  No interdigital macerations.  No blisters, no weeping. No signs of secondary bacterial infection noted.  Musculoskeletal: Muscle strength 5/5 to all LE muscle groups  Hallux abductovalgus with bunion deformity bilaterally  No pain, crepitus or joint limitation noted with ROM.   Neurological: Sensation intact with 10 gram monofilament.  Vibratory sensation intact.  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 Toenails 1-5 b/l were debrided in length and girth without iatrogenic bleeding.Compounded Toenail Antifungal will be ordered and mailed to patient's home from [facility]. The medication consist of terbinafine 3%, fluconazole 2%, tea tree oil 5%, urea 10%, ibuprofen 2% and DMSO suspension. Apply to toenails once daily at bedtime. Calluses pared bilateral hallux without incident. For tinea pedis, prescription sent to pharmacy for Ketoconazole Cream 2% to be applied to both feet and between toes qd x 6 weeks.  This prescription was sent to her pharmacy at the [facility] in [place], [place]. Patient to continue soft, supportive shoe gear.  She is interested in acquiring diabetic shoes.  We will see if this is something [facility] will [facility] for her or she can have done in our clinic. Patient to report any pedal injuries to medical professional immediately. Follow up 3 months. Patient/POA to call  should there be a concern in the interim.

## 2018-11-08 MED ORDER — NONFORMULARY OR COMPOUNDED ITEM: 5 refills | Status: DC

## 2018-11-13 ENCOUNTER — Ambulatory Visit: Payer: Medicare Other | Admitting: Orthotics

## 2018-11-13 DIAGNOSIS — M2141 Flat foot [pes planus] (acquired), right foot: Secondary | ICD-10-CM

## 2018-11-13 DIAGNOSIS — M79675 Pain in left toe(s): Principal | ICD-10-CM

## 2018-11-13 DIAGNOSIS — M2142 Flat foot [pes planus] (acquired), left foot: Secondary | ICD-10-CM

## 2018-11-13 DIAGNOSIS — E1149 Type 2 diabetes mellitus with other diabetic neurological complication: Secondary | ICD-10-CM

## 2018-11-13 DIAGNOSIS — M79674 Pain in right toe(s): Principal | ICD-10-CM

## 2018-11-13 DIAGNOSIS — B351 Tinea unguium: Secondary | ICD-10-CM

## 2018-11-13 DIAGNOSIS — E1140 Type 2 diabetes mellitus with diabetic neuropathy, unspecified: Secondary | ICD-10-CM

## 2018-11-13 NOTE — Progress Notes (Signed)

## 2018-11-15 ENCOUNTER — Telehealth: Payer: Self-pay | Admitting: Podiatry

## 2018-11-15 ENCOUNTER — Encounter: Payer: Self-pay | Admitting: Podiatry

## 2018-11-15 NOTE — Telephone Encounter (Signed)
Left message for pt that we would have to [facility] to [facility] Elisha Ponder and then we would have to submit a request for them to cover shoes from our office and to call if any questions

## 2018-11-23 NOTE — Telephone Encounter (Signed)
Pt returned my call today and left message.  I returned her call and left a message for her to call me back.

## 2018-11-28 DIAGNOSIS — Z124 Encounter for screening for malignant neoplasm of cervix: Secondary | ICD-10-CM | POA: Diagnosis not present

## 2018-11-28 DIAGNOSIS — Z01419 Encounter for gynecological examination (general) (routine) without abnormal findings: Secondary | ICD-10-CM | POA: Diagnosis not present

## 2018-11-28 DIAGNOSIS — R1084 Generalized abdominal pain: Secondary | ICD-10-CM | POA: Diagnosis not present

## 2018-11-28 DIAGNOSIS — Z1151 Encounter for screening for human papillomavirus (HPV): Secondary | ICD-10-CM | POA: Diagnosis not present

## 2018-11-28 DIAGNOSIS — Z01411 Encounter for gynecological examination (general) (routine) with abnormal findings: Secondary | ICD-10-CM | POA: Diagnosis not present

## 2018-11-28 DIAGNOSIS — Z1231 Encounter for screening mammogram for malignant neoplasm of breast: Secondary | ICD-10-CM | POA: Diagnosis not present

## 2018-11-28 LAB — HM MAMMOGRAPHY

## 2018-12-11 ENCOUNTER — Encounter: Payer: Self-pay | Admitting: Family Medicine

## 2018-12-11 ENCOUNTER — Ambulatory Visit (INDEPENDENT_AMBULATORY_CARE_PROVIDER_SITE_OTHER): Payer: Medicare Other

## 2018-12-11 ENCOUNTER — Ambulatory Visit (INDEPENDENT_AMBULATORY_CARE_PROVIDER_SITE_OTHER): Payer: Medicare Other | Admitting: Family Medicine

## 2018-12-11 ENCOUNTER — Telehealth: Payer: Self-pay

## 2018-12-11 VITALS — BP 120/78 | HR 75 | Temp 98.2°F | Ht 66.0 in

## 2018-12-11 DIAGNOSIS — M25562 Pain in left knee: Secondary | ICD-10-CM

## 2018-12-11 DIAGNOSIS — M1712 Unilateral primary osteoarthritis, left knee: Secondary | ICD-10-CM | POA: Diagnosis not present

## 2018-12-11 DIAGNOSIS — R1084 Generalized abdominal pain: Secondary | ICD-10-CM | POA: Diagnosis not present

## 2018-12-11 DIAGNOSIS — M5432 Sciatica, left side: Secondary | ICD-10-CM | POA: Diagnosis not present

## 2018-12-11 MED ORDER — PREDNISONE 5 MG PO TABS: ORAL_TABLET | ORAL | 0 refills | Status: DC

## 2018-12-11 NOTE — Progress Notes (Signed)
Erica [name] - 63 y.o. female MRN [id number]  Date of birth: December 14, 1955  SUBJECTIVE:  Including CC & ROS.  Chief Complaint  Patient presents with  . Pain    pain in both hips to lower back/ Left knee pain feels like about to give out/ no injury/ 1 week    Erica [name] is a 63 y.o. female that is  Presenting with acute low back pain with left sided sciatica and left knee pain. Symptoms have been present for 1 week.  She feels the pain in the lower back and then also in the lateral aspect of the lower leg.  It radiates down to the ankle and foot.  Symptoms are sharp and stabbing.  She denies any inciting event or trauma.  Has not had any improvement with home modalities.  Feels like the symptoms are worsening.  Symptoms are worse with prolonged standing.   Also presenting with left knee pain.  She feels the pain anteriorly.  Denies any inciting event or trauma.  Feels like the pain is intermittent.  Feels like her knee may give out at times.  Denies any locking.  Symptoms are moderate.  Symptoms are sharp and stabbing.  Symptoms are worse with bending.   Review of Systems  Constitutional: Negative for fever.  HENT: Negative for congestion.   Respiratory: Negative for cough.   Cardiovascular: Negative for chest pain.  Gastrointestinal: Negative for abdominal pain.  Musculoskeletal: Positive for back pain.  Skin: Negative for color change.  Neurological: Negative for weakness.  Hematological: Negative for adenopathy.  Psychiatric/Behavioral: Negative for agitation.    HISTORY: Past Medical, Surgical, Social, and Family History Reviewed & Updated per EMR.   Pertinent Historical Findings include:  Past Medical History:  Diagnosis Date  . Abnormal stress test    a. 09/2016: NST showed a small defect of mild severity present in the basal inferoseptal and mid inferoseptal location, consistent with ischemia. --> medically managed  . ALLERGIC RHINITIS   . Anxiety   . Back pain   .  Bursitis   . Constipation   . DDD (degenerative disc disease), lumbar    ESI with Ramos (spring 2016)  . Depression   . Diabetes mellitus without complication ([place])   . Dry mouth   . Dyslipidemia   . Easy bruising   . External hemorrhoids   . Fatigue   . GERD (gastroesophageal reflux disease)   . Hay fever   . Hypertension   . Knee pain   . Leg cramping   . Muscle stiffness   . Nervousness   . Obesity   . Osteoarthritis   . Palpitations    a. prior event monitor showing sinus tachycardia, no PAF.   Marland Kitchen Panic attacks    Hx of depression  . Renal disorder   . Sinus pain   . Sleep apnea    CPAP hs  . Stress   . Trouble in sleeping     Past Surgical History:  Procedure Laterality Date  . ABDOMINAL HYSTERECTOMY    . MASS EXCISION Left 12/30/2015   Procedure: EXCISION LEFT LEG MASS;  Surgeon: Donnie Mesa, MD;  Location: [place];  Service: General;  Laterality: Left;  . REPLACEMENT TOTAL KNEE Right   . RIGHT OOPHORECTOMY     No cancer    Allergies  Allergen Reactions  . Codeine Nausea And Vomiting  . Guaifenesin-Codeine Other (See Comments)    REACTION: feels spacey  . Wellbutrin [Bupropion] Palpitations  hallucinations    Family History  Problem Relation Age of Onset  . Heart disease Mother        Enlarged Heart, Pacemaker  . Diabetes Mother   . Thyroid disease Mother   . Hyperlipidemia Mother   . Hypertension Mother   . Sleep apnea Mother   . Dementia Father   . Kidney failure Father   . Prostate cancer Father   . Alcoholism Father   . Asthma Other   . Allergies Sister   . Asthma Son   . Breast cancer Maternal Aunt        cousin  . Colon cancer Cousin   . Heart disease Son        CHF, morbid obesity  . Prostate cancer Maternal Uncle   . Diabetes Son      Social History   Socioeconomic History  . Marital status: Married    Spouse name: [name]  . Number of children: 3  . Years of education: Not on file  . Highest education  level: Not on file  Occupational History  . Occupation: Disabled  Social Needs  . Financial resource strain: Not on file  . Food insecurity:    Worry: Not on file    Inability: Not on file  . Transportation needs:    Medical: Not on file    Non-medical: Not on file  Tobacco Use  . Smoking status: Never Smoker  . Smokeless tobacco: Never Used  Substance and Sexual Activity  . Alcohol use: Yes    Alcohol/week: 1.0 standard drinks    Types: 1 Glasses of wine per week    Comment: one drink a month  . Drug use: No  . Sexual activity: Not Currently  Lifestyle  . Physical activity:    Days per week: Not on file    Minutes per session: Not on file  . Stress: Not on file  Relationships  . Social connections:    Talks on phone: Not on file    Gets together: Not on file    Attends religious service: Not on file    Active member of club or organization: Not on file    Attends meetings of clubs or organizations: Not on file    Relationship status: Not on file  . Intimate partner violence:    Fear of current or ex partner: Not on file    Emotionally abused: Not on file    Physically abused: Not on file    Forced sexual activity: Not on file  Other Topics Concern  . Not on file  Social History Narrative   Married with children. Pt is on disablity. Previous worked in the school system     PHYSICAL EXAM:  VS: BP 120/78   Pulse 75   Temp 98.2 F (36.8 C) (Oral)   Ht 5\' 6"  (1.676 m)   SpO2 96%   BMI 38.54 kg/m  Physical Exam Gen: NAD, alert, cooperative with exam, well-appearing ENT: normal lips, normal nasal mucosa,  Eye: normal EOM, normal conjunctiva and lids CV:  no edema, +2 pedal pulses   Resp: no accessory muscle use, non-labored,  GI: no masses or tenderness, no hernia  Skin: no rashes, no areas of induration  Neuro: normal tone, normal sensation to touch Psych:  normal insight, alert and oriented MSK:  Back Exam:  Inspection: Unremarkable  Palpable  tenderness: none Leg strength: Quad: 5/5 Hamstring: 5/5 Hip flexor:  Strength at foot: Plantar-flexion: 5/5 Dorsi-flexion: 5/5 Eversion: 5/5 Inversion:  5/5  Gait unremarkable. SLR laying: Negative  XSLR laying: Negative  Left Knee: Normal to inspection with no erythema or effusion or obvious bony abnormalities. Palpation normal with no warmth,  Mild medial and lateral joint line tenderness  ROM full in flexion and extension and lower leg rotation. Ligaments with solid consistent endpoints including  LCL, MCL. Negative Mcmurray's Non painful patellar compression. Patellar glide without crepitus. Patellar and quadriceps tendons unremarkable. Hamstring and quadriceps strength is normal.  Neurovascularly intact       ASSESSMENT & PLAN:   Sciatica of left side Acute exacerbation of her sciatica.  Also having some low back pain likely related to spasm.  Previous CT scans ago showed mild degenerative changes of the hip joints.  Less likely to be the hip.  Possible for greater trochanteric bursitis. -Prednisone today. -Counseled on home exercise therapy and supportive care. -If no improvement would consider imaging or physical therapy.  Acute pain of left knee Pain seems to be more patellofemoral in nature.  Less likely for significant cause from osteoarthritis. -X-ray today. -Counseled on supportive care. -If no improvement can consider injection.

## 2018-12-11 NOTE — Assessment & Plan Note (Addendum)
Acute exacerbation of her sciatica.  Also having some low back pain likely related to spasm.  Previous CT scans ago showed mild degenerative changes of the hip joints.  Less likely to be the hip.  Possible for greater trochanteric bursitis. -Prednisone today. -Counseled on home exercise therapy and supportive care. -If no improvement would consider imaging or physical therapy.

## 2018-12-11 NOTE — Assessment & Plan Note (Signed)
Pain seems to be more patellofemoral in nature.  Less likely for significant cause from osteoarthritis. -X-ray today. -Counseled on supportive care. -If no improvement can consider injection.

## 2018-12-11 NOTE — Telephone Encounter (Signed)
Called pt unable to leave VM need to inform her that Dr. Raeford Razor wants to try the prednisone first and see if that helps before trying the gabapentin

## 2018-12-11 NOTE — Patient Instructions (Signed)
Good to see you  Please try the medicine  Please try the stretches and exercises  I will call you with the results from today  Please try ice on the knee  Please see me back in 2-3 weeks if no better.

## 2018-12-12 ENCOUNTER — Encounter: Payer: Self-pay | Admitting: Internal Medicine

## 2018-12-12 ENCOUNTER — Telehealth: Payer: Self-pay | Admitting: Family Medicine

## 2018-12-12 NOTE — Telephone Encounter (Signed)
Left VM for patient. If she calls back please have her speak with a nurse/[name] and inform that her xray shows moderate degenerative changes of the inside of the knee. The [facility] can report results to patient.   If any questions then please take the best time and phone number to call and I will try to call her back.   Rosemarie Ax, MD [facility] 12/12/2018, 3:48 PM

## 2018-12-13 NOTE — Telephone Encounter (Signed)
Message read to patient. States she does have additional questions and would like a CB from Dr. Raeford Razor. Cell...[phone] States 4:30 is best time to reach her. Also wants Dr. Raeford Razor to know "I'm still hurting."

## 2018-12-14 ENCOUNTER — Encounter: Payer: Self-pay | Admitting: Family Medicine

## 2018-12-14 ENCOUNTER — Other Ambulatory Visit: Payer: Self-pay | Admitting: Internal Medicine

## 2018-12-14 ENCOUNTER — Ambulatory Visit (INDEPENDENT_AMBULATORY_CARE_PROVIDER_SITE_OTHER): Payer: Medicare Other

## 2018-12-14 ENCOUNTER — Ambulatory Visit (INDEPENDENT_AMBULATORY_CARE_PROVIDER_SITE_OTHER): Payer: Medicare Other | Admitting: Family Medicine

## 2018-12-14 VITALS — BP 122/86 | HR 76 | Temp 98.5°F | Ht 66.0 in

## 2018-12-14 DIAGNOSIS — K5909 Other constipation: Secondary | ICD-10-CM

## 2018-12-14 DIAGNOSIS — M5432 Sciatica, left side: Secondary | ICD-10-CM | POA: Diagnosis not present

## 2018-12-14 DIAGNOSIS — M47815 Spondylosis without myelopathy or radiculopathy, thoracolumbar region: Secondary | ICD-10-CM | POA: Diagnosis not present

## 2018-12-14 DIAGNOSIS — M25562 Pain in left knee: Secondary | ICD-10-CM

## 2018-12-14 MED ORDER — GABAPENTIN 100 MG PO CAPS: 100.0000 mg | ORAL_CAPSULE | Freq: Three times a day (TID) | ORAL | 3 refills | Status: DC

## 2018-12-14 MED ORDER — METFORMIN HCL 500 MG PO TABS: 500.0000 mg | ORAL_TABLET | Freq: Every day | ORAL | 0 refills | Status: DC

## 2018-12-14 MED ORDER — ATENOLOL 25 MG PO TABS: ORAL_TABLET | ORAL | 0 refills | Status: DC

## 2018-12-14 NOTE — Assessment & Plan Note (Signed)
Still seems more patellofemoral as opposed to degenerative as the source. -Injection today. -Counseled on home exercise therapy and supportive care. -If no improvement can consider physical therapy or bracing

## 2018-12-14 NOTE — Telephone Encounter (Signed)
Copied from [place] [id number]. Topic: Quick Communication - Rx Refill/Question >> Dec 14, 2018  2:50 PM Colvin Caroli N wrote: Medication: atenolol (TENORMIN) 25 MG tablet , metFORMIN (GLUCOPHAGE) 500 MG tablet and linaclotide (LINZESS) 145 MCG CAPS capsule  Patient is requesting refills.   Preferred Pharmacy (with phone number or street name):[facility] - [place], [place] - [address] (Phone) [phone] (Fax)

## 2018-12-14 NOTE — Assessment & Plan Note (Signed)
No improvement with prednisone.  Still likely seems sciatica with the route of pain. -Gabapentin. -Hip and lumbar x-ray. -Counseled supportive care  -If no improvement will consider physical therapy

## 2018-12-14 NOTE — Progress Notes (Signed)
Erica [name] - 63 y.o. female MRN [id number]  Date of birth: [birth year]  SUBJECTIVE:  Including CC & ROS.  Chief Complaint  Patient presents with  . Pain    left hip and knee pain/ not better from visit on 3/2/ pt wants to discuss injection/ pt also wants clarification of what is wrong with knee from [place] is a 63 y.o. female that is   presenting with worsening left-sided sciatic type pain and left knee pain.  Has been on prednisone with no alleviation of the pain.  The pain is constant and severe.  It is sharp and stabbing.  She is not able to sleep.  It is worse with any movement.  Denies any numbness or tingling.  No saddle anesthesia.  Independent review of the left knee x-ray from 3/2 shows mild degenerative changes of the medial joint space.  Review of the MRI from 2009 shows a shallow central disc protrusion L4-5 and L5-S1.  No significant neurocompression.   Review of Systems  Constitutional: Negative for fever.  HENT: Negative for congestion.   Respiratory: Negative for cough.   Cardiovascular: Negative for chest pain.  Gastrointestinal: Negative for abdominal pain.  Musculoskeletal: Positive for arthralgias and back pain.  Skin: Negative for color change.  Neurological: Negative for weakness.  Hematological: Negative for adenopathy.  Psychiatric/Behavioral: Negative for agitation.    HISTORY: Past Medical, Surgical, Social, and Family History Reviewed & Updated per EMR.   Pertinent Historical Findings include:  Past Medical History:  Diagnosis Date  . Abnormal stress test    a. 09/2016: NST showed a small defect of mild severity present in the basal inferoseptal and mid inferoseptal location, consistent with ischemia. --> medically managed  . ALLERGIC RHINITIS   . Anxiety   . Back pain   . Bursitis   . Constipation   . DDD (degenerative disc disease), lumbar    ESI with Ramos (spring 2016)  . Depression   . Diabetes mellitus without  complication ([place])   . Dry mouth   . Dyslipidemia   . Easy bruising   . External hemorrhoids   . Fatigue   . GERD (gastroesophageal reflux disease)   . Hay fever   . Hypertension   . Knee pain   . Leg cramping   . Muscle stiffness   . Nervousness   . Obesity   . Osteoarthritis   . Palpitations    a. prior event monitor showing sinus tachycardia, no PAF.   Marland Kitchen Panic attacks    Hx of depression  . Renal disorder   . Sinus pain   . Sleep apnea    CPAP hs  . Stress   . Trouble in sleeping     Past Surgical History:  Procedure Laterality Date  . ABDOMINAL HYSTERECTOMY    . MASS EXCISION Left 12/30/2015   Procedure: EXCISION LEFT LEG MASS;  Surgeon: Donnie Mesa, MD;  Location: [place];  Service: General;  Laterality: Left;  . REPLACEMENT TOTAL KNEE Right   . RIGHT OOPHORECTOMY     No cancer    Allergies  Allergen Reactions  . Codeine Nausea And Vomiting  . Guaifenesin-Codeine Other (See Comments)    REACTION: feels spacey  . Wellbutrin [Bupropion] Palpitations    hallucinations    Family History  Problem Relation Age of Onset  . Heart disease Mother        Enlarged Heart, Pacemaker  . Diabetes Mother   .  Thyroid disease Mother   . Hyperlipidemia Mother   . Hypertension Mother   . Sleep apnea Mother   . Dementia Father   . Kidney failure Father   . Prostate cancer Father   . Alcoholism Father   . Asthma Other   . Allergies Sister   . Asthma Son   . Breast cancer Maternal Aunt        cousin  . Colon cancer Cousin   . Heart disease Son        CHF, morbid obesity  . Prostate cancer Maternal Uncle   . Diabetes Son      Social History   Socioeconomic History  . Marital status: Married    Spouse name: [name]  . Number of children: 3  . Years of education: Not on file  . Highest education level: Not on file  Occupational History  . Occupation: Disabled  Social Needs  . Financial resource strain: Not on file  . Food insecurity:     Worry: Not on file    Inability: Not on file  . Transportation needs:    Medical: Not on file    Non-medical: Not on file  Tobacco Use  . Smoking status: Never Smoker  . Smokeless tobacco: Never Used  Substance and Sexual Activity  . Alcohol use: Yes    Alcohol/week: 1.0 standard drinks    Types: 1 Glasses of wine per week    Comment: one drink a month  . Drug use: No  . Sexual activity: Not Currently  Lifestyle  . Physical activity:    Days per week: Not on file    Minutes per session: Not on file  . Stress: Not on file  Relationships  . Social connections:    Talks on phone: Not on file    Gets together: Not on file    Attends religious service: Not on file    Active member of club or organization: Not on file    Attends meetings of clubs or organizations: Not on file    Relationship status: Not on file  . Intimate partner violence:    Fear of current or ex partner: Not on file    Emotionally abused: Not on file    Physically abused: Not on file    Forced sexual activity: Not on file  Other Topics Concern  . Not on file  Social History Narrative   Married with children. Pt is on disablity. Previous worked in the school system     PHYSICAL EXAM:  VS: BP 122/86   Pulse 76   Temp 98.5 F (36.9 C) (Oral)   Ht 5\' 6"  (1.676 m)   SpO2 96%   BMI 38.54 kg/m  Physical Exam Gen: NAD, alert, cooperative with exam, well-appearing ENT: normal lips, normal nasal mucosa,  Eye: normal EOM, normal conjunctiva and lids CV:  no edema, +2 pedal pulses   Resp: no accessory muscle use, non-labored,  Skin: no rashes, no areas of induration  Neuro: normal tone, normal sensation to touch Psych:  normal insight, alert and oriented MSK:  Back:  No ttp of lower lumbar paraspinal muscle Normal left hip IR and ER  Normal strength to resistance with hip flexion, Positive straight leg raise on the left. Left knee: No effusion. Tenderness palpation over the patellar tendon and  medial joint line. Normal range of motion. Normal strength resistance. Negative McMurray's test. Neurovascular intact   Aspiration/Injection Procedure Note Erica [name] 01-04-1956  Procedure: Injection Indications: Left  knee pain  Procedure Details Consent: Risks of procedure as well as the alternatives and risks of each were explained to the (patient/caregiver).  Consent for procedure obtained. Time Out: Verified patient identification, verified procedure, site/side was marked, verified correct patient position, special equipment/implants available, medications/allergies/relevent history reviewed, required imaging and test results available.  Performed.  The area was cleaned with iodine and alcohol swabs.    The left knee superior lateral suprapatellar pouch was injected using 1 cc's of 40 mg Kenalog and 4 cc's of 0.25% bupivacaine with a 22 1 1/2" needle.  Ultrasound was used. Images were obtained in long views showing the injection.     A sterile dressing was applied.  Patient did tolerate procedure well.      ASSESSMENT & PLAN:   Sciatica of left side No improvement with prednisone.  Still likely seems sciatica with the route of pain. -Gabapentin. -Hip and lumbar x-ray. -Counseled supportive care  -If no improvement will consider physical therapy  Acute pain of left knee Still seems more patellofemoral as opposed to degenerative as the source. -Injection today. -Counseled on home exercise therapy and supportive care. -If no improvement can consider physical therapy or bracing

## 2018-12-14 NOTE — Patient Instructions (Signed)
Good to see you  Please try the gabapentin. This can make you sleepy. Please start with one at night and then increase to two time or three times daily  Please try the exercises  I will call you with the results from today  Please follow up with me in 2-3 weeks if no better.

## 2018-12-15 ENCOUNTER — Telehealth: Payer: Self-pay | Admitting: Family Medicine

## 2018-12-15 NOTE — Telephone Encounter (Signed)
Spoke with patient about results.    Rosemarie Ax, MD [facility] 12/15/2018, 9:06 AM

## 2018-12-18 IMAGING — DX DG CHEST 2V
2 series · 2 of 2 positions shown · non-contrast
Comparison: 12/08/2016.

CLINICAL DATA: Cough and congestion.

EXAM:
CHEST  2 VIEW

[chest pa]
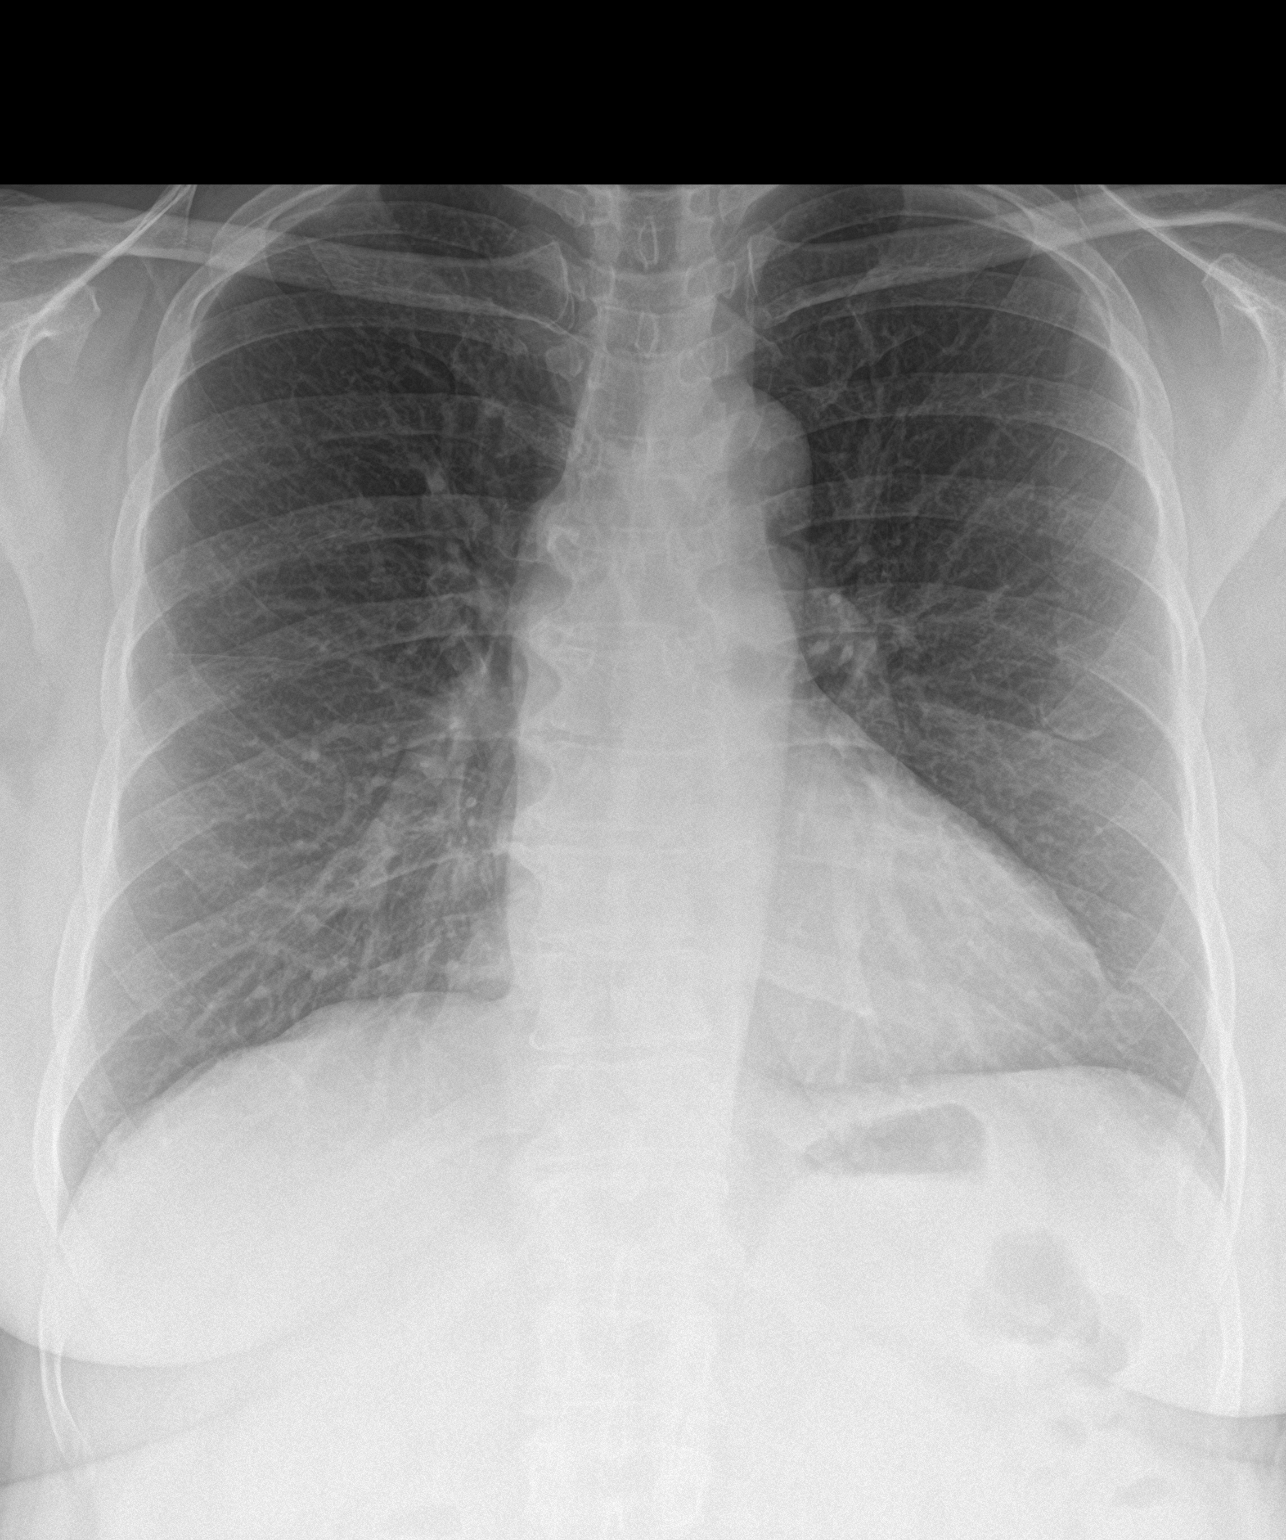

[chest lat]
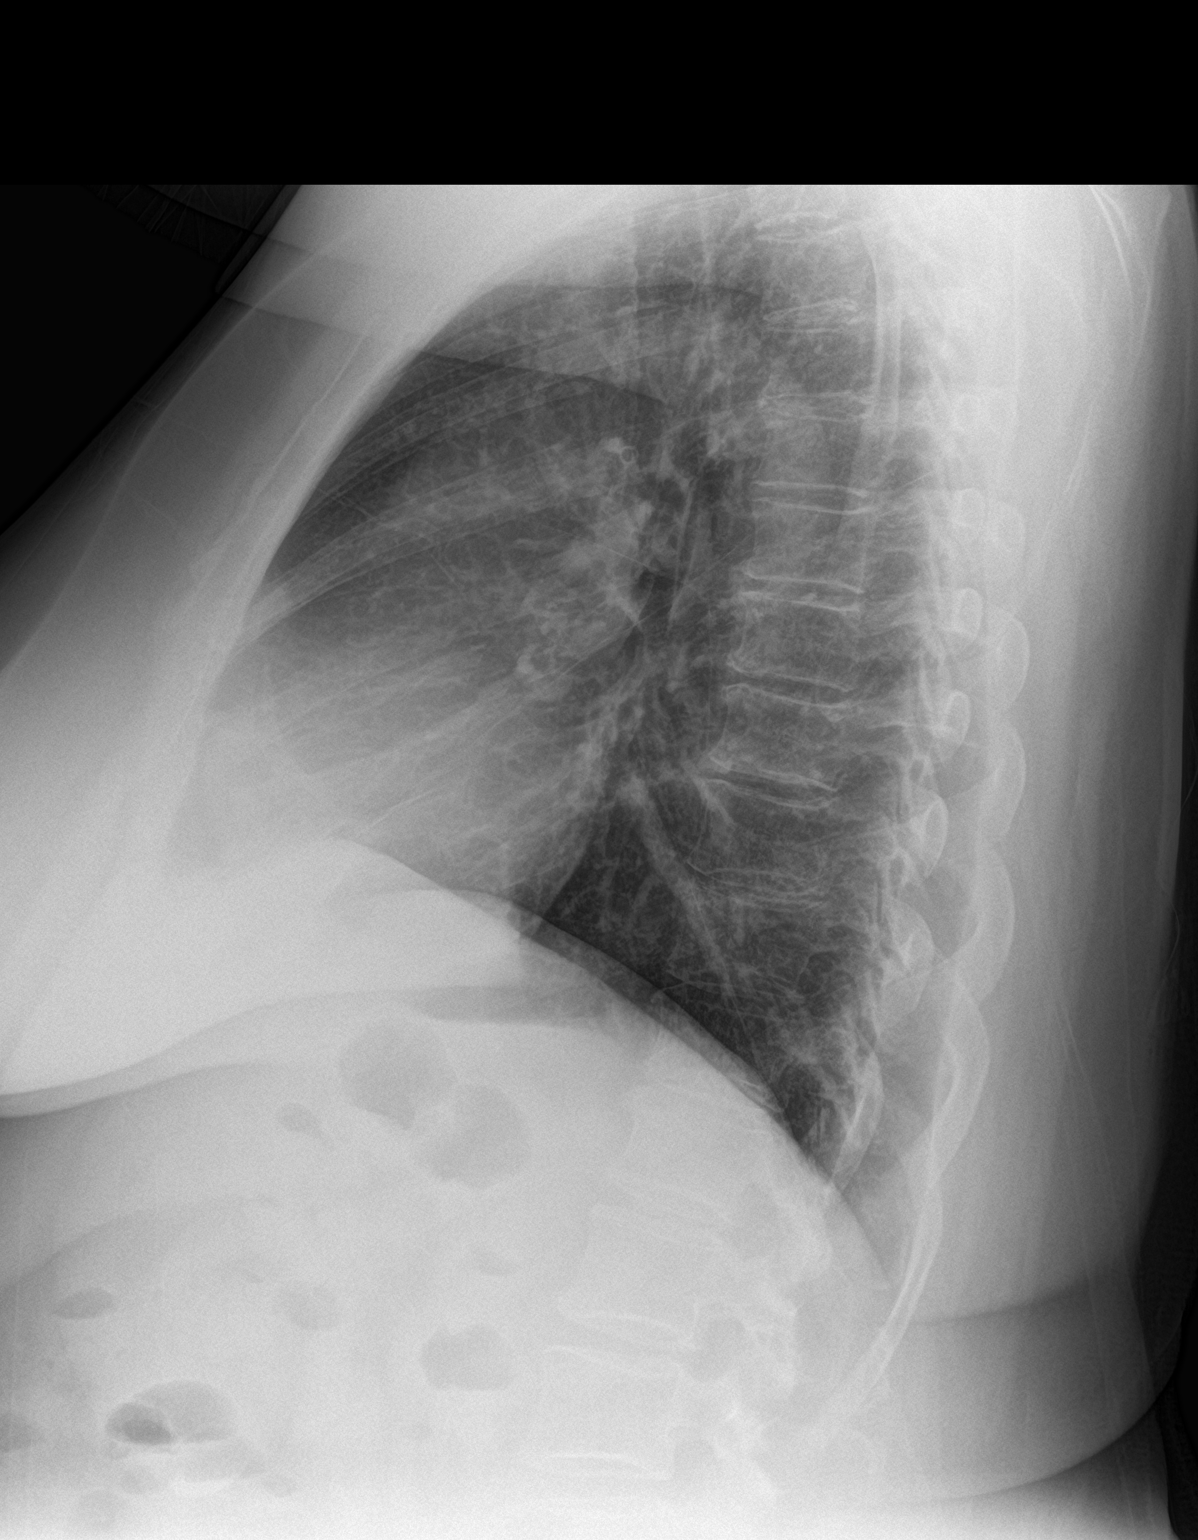

[2 of 2 positions shown; findings below may reference images not displayed]

FINDINGS: Mediastinum and hilar structures normal. Lungs are clear. Heart size
normal. No pleural effusion or pneumothorax. Degenerative changes
thoracic spine.
IMPRESSION: No acute cardiopulmonary disease .

## 2018-12-22 ENCOUNTER — Ambulatory Visit: Payer: Medicare Other | Admitting: Orthotics

## 2018-12-22 ENCOUNTER — Other Ambulatory Visit: Payer: Self-pay

## 2018-12-22 DIAGNOSIS — E1142 Type 2 diabetes mellitus with diabetic polyneuropathy: Secondary | ICD-10-CM | POA: Diagnosis not present

## 2018-12-22 DIAGNOSIS — L84 Corns and callosities: Secondary | ICD-10-CM | POA: Diagnosis not present

## 2019-01-01 DIAGNOSIS — F332 Major depressive disorder, recurrent severe without psychotic features: Secondary | ICD-10-CM | POA: Diagnosis not present

## 2019-01-01 DIAGNOSIS — F411 Generalized anxiety disorder: Secondary | ICD-10-CM | POA: Diagnosis not present

## 2019-01-01 DIAGNOSIS — F410 Panic disorder [episodic paroxysmal anxiety] without agoraphobia: Secondary | ICD-10-CM | POA: Diagnosis not present

## 2019-01-23 ENCOUNTER — Encounter: Payer: Self-pay | Admitting: Family Medicine

## 2019-01-23 ENCOUNTER — Encounter: Payer: Self-pay | Admitting: Internal Medicine

## 2019-01-24 ENCOUNTER — Telehealth: Payer: Self-pay | Admitting: Internal Medicine

## 2019-01-24 ENCOUNTER — Telehealth: Payer: Self-pay | Admitting: *Deleted

## 2019-01-24 DIAGNOSIS — J301 Allergic rhinitis due to pollen: Secondary | ICD-10-CM

## 2019-01-24 MED ORDER — LEVOCETIRIZINE DIHYDROCHLORIDE 5 MG PO TABS: 5.0000 mg | ORAL_TABLET | Freq: Every evening | ORAL | 1 refills | Status: DC

## 2019-01-24 NOTE — Telephone Encounter (Signed)
Copied from [place] [id number]. Topic: Quick Communication - Rx Refill/Question >> Jan 24, 2019  1:08 PM Celene Kras A wrote: Medication: levocetirizine (XYZAL) 5 MG tablet, omeprazole (PRILOSEC) 20 MG capsule  Has the patient contacted their pharmacy? Yes.   (Agent: If no, request that the patient contact the pharmacy for the refill.) (Agent: If yes, when and what did the pharmacy advise?)  Preferred Pharmacy (with phone number or street name): [facility] - [place], [place] - [address] [place] [address] Phone: [phone] Fax: [phone] Not a 24 hour pharmacy; exact hours not known.    Agent: Please be advised that RX refills may take up to 3 business days. We ask that you follow-up with your pharmacy.

## 2019-01-24 NOTE — Telephone Encounter (Signed)
Copied from [place] [phone]. Topic: General - Other >> Jan 24, 2019  1:13 PM Erica [name] wrote: Reason for CRM: Pt called stating she is still experiencing back pain. She states Dr. Raeford Razor told her if she was still experiencing the pain then they would have to set up [name] MRI. Pt is also stating she is almost out of gabapentin (NEURONTIN) 100 MG capsule. She is requesting [name] refill. Please advise

## 2019-01-24 NOTE — Telephone Encounter (Signed)
Sent!

## 2019-01-25 NOTE — Telephone Encounter (Signed)
Left VM for patient. If she calls back please have her speak with a nurse/[name] and inform that I would hold off on an MRI for now due to the virus. We can increase the gabapentin to 300 mg TID. I would consider a follow up after increasing the medicine if she is still having pain. The [facility] can report results to patient.   If any questions then please take the best time and phone number to call and I will try to call her back.   Rosemarie Ax, MD [facility] 01/25/2019, 1:24 PM

## 2019-01-31 NOTE — Telephone Encounter (Signed)
Pt. Reports gabapentin is helping, but she is afraid to increase dose because it makes her sleepy. Would like to add a muscle relaxer if possible. States has used Baclofen in the past. Agrees with holding off on MRI. Please advise pt.

## 2019-02-02 MED ORDER — BACLOFEN 10 MG PO TABS: 10.0000 mg | ORAL_TABLET | Freq: Two times a day (BID) | ORAL | 0 refills | Status: DC | PRN

## 2019-02-02 NOTE — Telephone Encounter (Signed)
Spoke with patient. Will start baclofen.   Rosemarie Ax, MD [facility] 02/02/2019, 9:37 AM

## 2019-02-02 NOTE — Addendum Note (Signed)
Addended by: Rosemarie Ax on: 02/02/2019 09:37 AM   Modules accepted: Orders

## 2019-02-05 ENCOUNTER — Ambulatory Visit: Payer: Medicare Other | Admitting: Podiatry

## 2019-02-11 NOTE — Progress Notes (Signed)
Virtual Visit via Video Note  I connected with Erica [name] Born on 02/12/19 at  1:30 PM EDT by a video enabled telemedicine application and verified that I am speaking with the correct person using two identifiers.   I discussed the limitations of evaluation and management by telemedicine and the availability of in person appointments. The patient expressed understanding and agreed to proceed.  The patient is currently at home and I am in the office.    No referring provider.    History of Present Illness: She is here for follow up of her chronic medical conditions.   She is not exercising regularly due to knee and back pain. She does a little walking sometimes.     Sinus issues: She has seasonal allergies, but today she started ear popping, ear pressure, headache and sinus pressure.  She is taking loratadine and using flonase.  She did take 1 of her migraine medications earlier today.  She has not taken any Tylenol or anything else for the sinus pressure.  Diabetes: She is taking her medication daily as prescribed. She is not compliant with a diabetic diet.  She monitors her sugars and they have been running less than 140. She checks her feet daily and denies foot lesions. She is up-to-date with an ophthalmology examination.   Hypertension: She is taking her medication daily. She is compliant with a low sodium diet.  She denies chest pain, palpitations, edema, shortness of breath and regular headaches. She does not monitor her blood pressure at home.    Hyperlipidemia: She is taking her medication daily. She is compliant with a low fat/cholesterol diet. She denies myalgias.   Migraines:  She takes treximet as needed.  She typically gets one migraine a month.   GERD:  She is taking her medication daily as prescribed.  She denies any GERD symptoms and feels her GERD is well controlled.   Constipation:  She takes linzess daily.   She has a BM about three days a week.  She has intermittent  pain in her abdomen - in the upper abdomen.  She saw her gyn and it is not gyn related.  She denies GERD.  The pain may come about three times a week.      Review of Systems  Constitutional: Negative for chills and fever.  HENT: Negative for congestion, ear pain (pressure and popping), sinus pain (sinus pressure) and sore throat.        PND  Respiratory: Positive for wheezing. Negative for cough and shortness of breath.   Cardiovascular: Positive for leg swelling (mild at times). Negative for chest pain and palpitations.  Neurological: Positive for headaches. Negative for dizziness.     Social History   Socioeconomic History  . Marital status: Married    Spouse name: [name]  . Number of children: 3  . Years of education: Not on file  . Highest education level: Not on file  Occupational History  . Occupation: Disabled  Social Needs  . Financial resource strain: Not on file  . Food insecurity:    Worry: Not on file    Inability: Not on file  . Transportation needs:    Medical: Not on file    Non-medical: Not on file  Tobacco Use  . Smoking status: Never Smoker  . Smokeless tobacco: Never Used  Substance and Sexual Activity  . Alcohol use: Yes    Alcohol/week: 1.0 standard drinks    Types: 1 Glasses of wine per week  Comment: one drink a month  . Drug use: No  . Sexual activity: Not Currently  Lifestyle  . Physical activity:    Days per week: Not on file    Minutes per session: Not on file  . Stress: Not on file  Relationships  . Social connections:    Talks on phone: Not on file    Gets together: Not on file    Attends religious service: Not on file    Active member of club or organization: Not on file    Attends meetings of clubs or organizations: Not on file    Relationship status: Not on file  Other Topics Concern  . Not on file  Social History Narrative   Married with children. Pt is on disablity. Previous worked in the school system      Observations/Objective: Appears well in NAD Normal mood and affect  Assessment and Plan:  See Problem List for Assessment and Plan of chronic medical problems.   Follow Up Instructions:    I discussed the assessment and treatment plan with the patient. The patient was provided an opportunity to ask questions and all were answered. The patient agreed with the plan and demonstrated an understanding of the instructions.   The patient was advised to call back or seek an in-person evaluation if the symptoms worsen or if the condition fails to improve as anticipated.  Follow-up with me in the office in 6 months.  We will try to have her come in for blood work in 3 months.  [name], MD

## 2019-02-12 ENCOUNTER — Encounter: Payer: Self-pay | Admitting: Internal Medicine

## 2019-02-12 ENCOUNTER — Ambulatory Visit (INDEPENDENT_AMBULATORY_CARE_PROVIDER_SITE_OTHER): Payer: Medicare Other | Admitting: Internal Medicine

## 2019-02-12 DIAGNOSIS — E7849 Other hyperlipidemia: Secondary | ICD-10-CM | POA: Diagnosis not present

## 2019-02-12 DIAGNOSIS — I10 Essential (primary) hypertension: Secondary | ICD-10-CM | POA: Diagnosis not present

## 2019-02-12 DIAGNOSIS — J069 Acute upper respiratory infection, unspecified: Secondary | ICD-10-CM | POA: Diagnosis not present

## 2019-02-12 DIAGNOSIS — G43001 Migraine without aura, not intractable, with status migrainosus: Secondary | ICD-10-CM | POA: Diagnosis not present

## 2019-02-12 DIAGNOSIS — E119 Type 2 diabetes mellitus without complications: Secondary | ICD-10-CM | POA: Diagnosis not present

## 2019-02-12 DIAGNOSIS — K644 Residual hemorrhoidal skin tags: Secondary | ICD-10-CM

## 2019-02-12 DIAGNOSIS — J3489 Other specified disorders of nose and nasal sinuses: Secondary | ICD-10-CM | POA: Insufficient documentation

## 2019-02-12 DIAGNOSIS — K219 Gastro-esophageal reflux disease without esophagitis: Secondary | ICD-10-CM | POA: Diagnosis not present

## 2019-02-12 DIAGNOSIS — K5909 Other constipation: Secondary | ICD-10-CM

## 2019-02-12 MED ORDER — HYDROCORTISONE 2.5 % RE CREA: TOPICAL_CREAM | RECTAL | 3 refills | Status: DC | PRN

## 2019-02-12 MED ORDER — ALBUTEROL SULFATE HFA 108 (90 BASE) MCG/ACT IN AERS: 2.0000 | INHALATION_SPRAY | Freq: Four times a day (QID) | RESPIRATORY_TRACT | 3 refills | Status: DC | PRN

## 2019-02-12 MED ORDER — LINACLOTIDE 290 MCG PO CAPS: 290.0000 ug | ORAL_CAPSULE | Freq: Every day | ORAL | 1 refills | Status: DC

## 2019-02-12 MED ORDER — OMEPRAZOLE 20 MG PO CPDR: 20.0000 mg | DELAYED_RELEASE_CAPSULE | Freq: Every day | ORAL | 1 refills | Status: DC

## 2019-02-12 NOTE — Assessment & Plan Note (Signed)
Requested refill of her steroid cream-sent to pharmacy

## 2019-02-12 NOTE — Assessment & Plan Note (Signed)
BP Readings from Last 3 Encounters:  12/14/18 122/86  12/11/18 120/78  10/19/18 118/72   Blood pressure is always been well controlled Not monitoring at home Continue current medications at current doses

## 2019-02-12 NOTE — Assessment & Plan Note (Signed)
Has chronic allergies, but she started having some ear popping, sinus pressure and headaches today Not likely an infection Advised her to take Tylenol as needed and continue her allergy medications She will call if this does not improve

## 2019-02-12 NOTE — Assessment & Plan Note (Signed)
Chronic constipation Taking Linzess 145 mcg daily, which helps, but she is still constipated We will try 290 mcg of Linzess daily to see if that helps

## 2019-02-12 NOTE — Assessment & Plan Note (Signed)
Lab Results  Component Value Date   HGBA1C 6.2 (H) 07/05/2018    Sugars have been controlled We will continue current medications We will try to recheck blood work in 3 months and have her follow-up with me in the office in 6 months

## 2019-02-12 NOTE — Assessment & Plan Note (Signed)
Has approximately 1 migraine a month Takes Treximet as needed, which is effective Continue

## 2019-02-12 NOTE — Assessment & Plan Note (Signed)
GERD controlled Continue daily medication  

## 2019-02-12 NOTE — Assessment & Plan Note (Signed)
Continue statin. 

## 2019-02-15 DIAGNOSIS — Z20828 Contact with and (suspected) exposure to other viral communicable diseases: Secondary | ICD-10-CM | POA: Diagnosis not present

## 2019-03-16 ENCOUNTER — Encounter: Payer: Self-pay | Admitting: Family Medicine

## 2019-03-16 ENCOUNTER — Ambulatory Visit (INDEPENDENT_AMBULATORY_CARE_PROVIDER_SITE_OTHER): Payer: Medicare Other | Admitting: Family Medicine

## 2019-03-16 ENCOUNTER — Other Ambulatory Visit: Payer: Self-pay

## 2019-03-16 VITALS — BP 114/72 | HR 63 | Resp 16

## 2019-03-16 DIAGNOSIS — M5432 Sciatica, left side: Secondary | ICD-10-CM

## 2019-03-16 MED ORDER — DICLOFENAC SODIUM 75 MG PO TBEC: 75.0000 mg | DELAYED_RELEASE_TABLET | Freq: Two times a day (BID) | ORAL | 1 refills | Status: DC

## 2019-03-16 MED ORDER — BACLOFEN 10 MG PO TABS: 10.0000 mg | ORAL_TABLET | Freq: Two times a day (BID) | ORAL | 0 refills | Status: DC | PRN

## 2019-03-16 NOTE — Patient Instructions (Signed)
Good to see you Please try the diclofenac  Please try the muscle relaxer  Please try voltaren gel  Please try heat on the area  You will get a call to set up an MRI  Please send me a message in MyChart with any questions or updates.  I will call you once the MRI is completed.   --Dr. Raeford Razor

## 2019-03-16 NOTE — Progress Notes (Signed)
Erica [name] - 63 y.o. female MRN [id number]  Date of birth: 12-May-1956  SUBJECTIVE:  Including CC & ROS.  No chief complaint on file.   FRANCIE KEELING is a 63 y.o. female that is presenting with acute on chronic exacerbation of the lower back pain. The pain is moderate to severe. Has some pain down the left posterior leg. Pain is worse with transitioning from sitting to standing. No saddle anesthesia or urinary incontinence. No numbness. Pain is sharp and stabbing.    Review of Systems  Constitutional: Negative for fever.  HENT: Negative for congestion.   Respiratory: Negative for cough.   Cardiovascular: Negative for chest pain.  Gastrointestinal: Negative for abdominal pain.  Musculoskeletal: Positive for back pain.  Skin: Negative for color change.  Neurological: Negative for weakness.  Hematological: Negative for adenopathy.    HISTORY: Past Medical, Surgical, Social, and Family History Reviewed & Updated per EMR.   Pertinent Historical Findings include:  Past Medical History:  Diagnosis Date  . Abnormal stress test    a. 09/2016: NST showed a small defect of mild severity present in the basal inferoseptal and mid inferoseptal location, consistent with ischemia. --> medically managed  . ALLERGIC RHINITIS   . Anxiety   . Back pain   . Bursitis   . Constipation   . DDD (degenerative disc disease), lumbar    ESI with Ramos (spring 2016)  . Depression   . Diabetes mellitus without complication ([place])   . Dry mouth   . Dyslipidemia   . Easy bruising   . External hemorrhoids   . Fatigue   . GERD (gastroesophageal reflux disease)   . Hay fever   . Hypertension   . Knee pain   . Leg cramping   . Muscle stiffness   . Nervousness   . Obesity   . Osteoarthritis   . Palpitations    a. prior event monitor showing sinus tachycardia, no PAF.   Marland Kitchen Panic attacks    Hx of depression  . Renal disorder   . Sinus pain   . Sleep apnea    CPAP hs  . Stress   . Trouble  in sleeping     Past Surgical History:  Procedure Laterality Date  . ABDOMINAL HYSTERECTOMY    . MASS EXCISION Left 12/30/2015   Procedure: EXCISION LEFT LEG MASS;  Surgeon: Donnie Mesa, MD;  Location: [facility];  Service: General;  Laterality: Left;  . REPLACEMENT TOTAL KNEE Right   . RIGHT OOPHORECTOMY     No cancer    Allergies  Allergen Reactions  . Codeine Nausea And Vomiting  . Guaifenesin-Codeine Other (See Comments)    REACTION: feels spacey  . Wellbutrin [Bupropion] Palpitations    hallucinations    Family History  Problem Relation Age of Onset  . Heart disease Mother        Enlarged Heart, Pacemaker  . Diabetes Mother   . Thyroid disease Mother   . Hyperlipidemia Mother   . Hypertension Mother   . Sleep apnea Mother   . Dementia Father   . Kidney failure Father   . Prostate cancer Father   . Alcoholism Father   . Asthma Other   . Allergies Sister   . Asthma Son   . Breast cancer Maternal Aunt        cousin  . Colon cancer Cousin   . Heart disease Son        CHF, morbid obesity  .  Prostate cancer Maternal Uncle   . Diabetes Son      Social History   Socioeconomic History  . Marital status: Married    Spouse name: [name]  . Number of children: 3  . Years of education: Not on file  . Highest education level: Not on file  Occupational History  . Occupation: Disabled  Social Needs  . Financial resource strain: Not on file  . Food insecurity:    Worry: Not on file    Inability: Not on file  . Transportation needs:    Medical: Not on file    Non-medical: Not on file  Tobacco Use  . Smoking status: Never Smoker  . Smokeless tobacco: Never Used  Substance and Sexual Activity  . Alcohol use: Yes    Alcohol/week: 1.0 standard drinks    Types: 1 Glasses of wine per week    Comment: one drink a month  . Drug use: No  . Sexual activity: Not Currently  Lifestyle  . Physical activity:    Days per week: Not on file    Minutes per  session: Not on file  . Stress: Not on file  Relationships  . Social connections:    Talks on phone: Not on file    Gets together: Not on file    Attends religious service: Not on file    Active member of club or organization: Not on file    Attends meetings of clubs or organizations: Not on file    Relationship status: Not on file  . Intimate partner violence:    Fear of current or ex partner: Not on file    Emotionally abused: Not on file    Physically abused: Not on file    Forced sexual activity: Not on file  Other Topics Concern  . Not on file  Social History Narrative   Married with children. Pt is on disablity. Previous worked in the school system     PHYSICAL EXAM:  VS: BP 114/72   Pulse 63   Resp 16   SpO2 98%  Physical Exam Gen: NAD, alert, cooperative with exam, well-appearing ENT: normal lips, normal nasal mucosa,  Eye: normal EOM, normal conjunctiva and lids CV:  no edema, +2 pedal pulses   Resp: no accessory muscle use, non-labored,  Skin: no rashes, no areas of induration  Neuro: normal tone, normal sensation to touch Psych:  normal insight, alert and oriented MSK:  Back/left hip:  No TTP over the SI joint  TTp over the paraspinal muscles on the left side  Normal IR and ER  No TTP over the GT  Normal strength to resistance with hip flexion, knee flexion and extension, plantarflexion and dorsalflexion  +2 DTR's on left  Positive SLR  Neurovascularly intact       ASSESSMENT & PLAN:   Sciatica of left side Likely has a component of spasm in the lower back. Seems to have a nerve impingement component.  - baclofen  - diclofenac  - MRI lumbar spine to elevate for impingement and consider epidural.

## 2019-03-19 NOTE — Assessment & Plan Note (Signed)
Likely has a component of spasm in the lower back. Seems to have a nerve impingement component.  - baclofen  - diclofenac  - MRI lumbar spine to elevate for impingement and consider epidural.

## 2019-03-21 ENCOUNTER — Telehealth: Payer: Self-pay | Admitting: Internal Medicine

## 2019-03-21 DIAGNOSIS — I10 Essential (primary) hypertension: Secondary | ICD-10-CM

## 2019-03-21 DIAGNOSIS — E119 Type 2 diabetes mellitus without complications: Secondary | ICD-10-CM

## 2019-03-21 NOTE — Telephone Encounter (Signed)
We did a virtual visit not long ago and she has a follow-up in November.  I did discuss with her at her recent virtual visit that ideally we should get blood work done over the summer to check her A1c and kidney function.  I have ordered blood work and a urine test for diabetes.  If she feels comfortable over the next month or so she can pop into the lab and have blood work and urine test taken.

## 2019-03-21 NOTE — Telephone Encounter (Signed)
Pt aware of response below and states she will come by next week.

## 2019-03-29 DIAGNOSIS — F411 Generalized anxiety disorder: Secondary | ICD-10-CM | POA: Diagnosis not present

## 2019-03-29 DIAGNOSIS — F410 Panic disorder [episodic paroxysmal anxiety] without agoraphobia: Secondary | ICD-10-CM | POA: Diagnosis not present

## 2019-03-29 DIAGNOSIS — F332 Major depressive disorder, recurrent severe without psychotic features: Secondary | ICD-10-CM | POA: Diagnosis not present

## 2019-04-02 ENCOUNTER — Telehealth: Payer: Self-pay | Admitting: Internal Medicine

## 2019-04-02 MED ORDER — METFORMIN HCL 500 MG PO TABS: 500.0000 mg | ORAL_TABLET | Freq: Every day | ORAL | 1 refills | Status: DC

## 2019-04-02 NOTE — Telephone Encounter (Signed)
Medication: metFORMIN (GLUCOPHAGE) 500 MG tablet, simvastatin (ZOCOR) 40 MG tablet   Has the patient contacted their pharmacy? Yes.   (Agent: If no, request that the patient contact the pharmacy for the refill.) (Agent: If yes, when and what did the pharmacy advise?)  Preferred Pharmacy (with phone number or street name): [facility] - [place], [place] - [address] (Phone) [phone] (Fax)    Agent: Please be advised that RX refills may take up to 3 business days. We ask that you follow-up with your pharmacy.

## 2019-04-02 NOTE — Telephone Encounter (Signed)
Rx sent 

## 2019-04-04 ENCOUNTER — Other Ambulatory Visit: Payer: Self-pay

## 2019-04-04 ENCOUNTER — Ambulatory Visit
Admission: RE | Admit: 2019-04-04 | Discharge: 2019-04-04 | Disposition: A | Discharge status: Home / Self Care | Payer: Medicare Other | Source: Ambulatory Visit | Attending: Family Medicine | Admitting: Family Medicine

## 2019-04-04 DIAGNOSIS — M48061 Spinal stenosis, lumbar region without neurogenic claudication: Secondary | ICD-10-CM | POA: Diagnosis not present

## 2019-04-04 DIAGNOSIS — M5432 Sciatica, left side: Secondary | ICD-10-CM

## 2019-04-05 ENCOUNTER — Telehealth: Payer: Self-pay | Admitting: Family Medicine

## 2019-04-05 NOTE — Telephone Encounter (Signed)
Left VM for patient. If she calls back please have her speak with a nurse/[name] and inform that her MRI doesn't show a nerve imingement or stenosis. We could try physical therapy as she has spondylosis which physical therapy can help with. The [facility] can report results to patient.   If any questions then please take the best time and phone number to call and I will try to call her back.   Rosemarie Ax, MD [facility] 04/05/2019, 1:28 PM

## 2019-04-10 ENCOUNTER — Telehealth: Payer: Self-pay | Admitting: *Deleted

## 2019-04-10 DIAGNOSIS — M5432 Sciatica, left side: Secondary | ICD-10-CM

## 2019-04-10 NOTE — Telephone Encounter (Signed)
Spoke to patient and she stated that she could not do PT. Asking about an injection.

## 2019-04-10 NOTE — Telephone Encounter (Signed)
Spoke with patient about her MRi and will try an epidural.   Rosemarie Ax, MD [facility] 04/10/2019, 9:31 AM

## 2019-04-23 NOTE — Progress Notes (Deleted)
'@Patient'  ID: Erica [name], female    DOB: 01-Sep-1956, 63 y.o.   MRN: [id number]  No chief complaint on file.   Referring provider: [name], MD  HPI:   PMH:  Smoker/ Smoking History:  Maintenance:   Pt of: Dr. Stoney [name] is a 63 y.o. female with OSA on CPAP therapy. She is followed by Dr. [name].   04/23/2019  - Visit   HPI  Tests:   03/21/2017-home sleep study- AHI 12.3, SaO2 low 75%  10/21/2016-echocardiogram-LV ejection fraction 55 to 64%, grade 2 diastolic dysfunction  FENO:  No results found for: NITRICOXIDE  PFT: No flowsheet data found.  Imaging: Mr Lumbar Spine Wo Contrast  Result Date: 04/04/2019 CLINICAL DATA:  Back pain for 5 months. Occasional leg numbness bilaterally. EXAM: MRI LUMBAR SPINE WITHOUT CONTRAST TECHNIQUE: Multiplanar, multisequence MR imaging of the lumbar spine was performed. No intravenous contrast was administered. COMPARISON:  None. FINDINGS: Segmentation:  Standard. Alignment:  Physiologic. Vertebrae:  No fracture, evidence of discitis, or bone lesion. Conus medullaris and cauda equina: Conus extends to the T12 level. Conus and cauda equina appear normal. Paraspinal and other soft tissues: No acute paraspinal abnormality. Disc levels: Disc spaces: Disc spaces are maintained. T12-L1: No significant disc bulge. No evidence of neural foraminal stenosis. No central canal stenosis. L1-L2: No significant disc bulge. No evidence of neural foraminal stenosis. No central canal stenosis. Mild bilateral facet arthropathy. L2-L3: No significant disc bulge. No evidence of neural foraminal stenosis. No central canal stenosis. Mild bilateral facet arthropathy. L3-L4: No significant disc bulge. No evidence of neural foraminal stenosis. No central canal stenosis. Moderate right and mild left facet arthropathy. L4-L5: Mild broad-based disc bulge with a small central annular fissure. Mild bilateral facet arthropathy. No evidence of neural  foraminal stenosis. No central canal stenosis. L5-S1: Mild broad-based disc bulge. Mild bilateral facet arthropathy. No evidence of neural foraminal stenosis. No central canal stenosis. IMPRESSION: 1. Mild lumbar spine spondylosis as described above. No significant disc protrusion, foraminal stenosis or central canal stenosis. 2.  No acute osseous injury of the lumbar spine. Electronically Signed   By: [name]   On: 04/04/2019 13:44      Specialty Problems      Pulmonary Problems   Allergic rhinitis    Allegra, Flonase       Obstructive sleep apnea    AHI 8//h - 11/2009 AHI 13/h - 2014 HST 03/2017 >> (with oral appliance ) >> mild OSa persists - back on autoCPAP       Sinus pressure      Allergies  Allergen Reactions   Codeine Nausea And Vomiting   Guaifenesin-Codeine Other (See Comments)    REACTION: feels spacey   Wellbutrin [Bupropion] Palpitations    hallucinations    Immunization History  Administered Date(s) Administered   Influenza,inj,Quad PF,6+ Mos 07/25/2013, 09/09/2014, 08/22/2015, 09/27/2016, 09/27/2017, 08/01/2018   Pneumococcal Polysaccharide-23 01/30/2009   Td 03/17/2004   Tdap 09/09/2014    Past Medical History:  Diagnosis Date   Abnormal stress test    a. 09/2016: NST showed a small defect of mild severity present in the basal inferoseptal and mid inferoseptal location, consistent with ischemia. --> medically managed   ALLERGIC RHINITIS    Anxiety    Back pain    Bursitis    Constipation    DDD (degenerative disc disease), lumbar    ESI with Ramos (spring 2016)   Depression    Diabetes mellitus without  complication ([facility])    Dry mouth    Dyslipidemia    Easy bruising    External hemorrhoids    Fatigue    GERD (gastroesophageal reflux disease)    Hay fever    Hypertension    Knee pain    Leg cramping    Muscle stiffness    Nervousness    Obesity    Osteoarthritis    Palpitations    a. prior event  monitor showing sinus tachycardia, no PAF.    Panic attacks    Hx of depression   Renal disorder    Sinus pain    Sleep apnea    CPAP hs   Stress    Trouble in sleeping     Tobacco History: Social History   Tobacco Use  Smoking Status Never Smoker  Smokeless Tobacco Never Used   Counseling given: Not Answered   Continue to not smoke  Outpatient Encounter Medications as of 04/24/2019  Medication Sig   albuterol (VENTOLIN HFA) 108 (90 Base) MCG/ACT inhaler Inhale 2 puffs into the lungs every 6 (six) hours as needed for wheezing or shortness of breath.   ALPRAZolam (XANAX) 0.5 MG tablet Take 1 tablet (0.5 mg total) by mouth 2 (two) times daily as needed for anxiety.   atenolol (TENORMIN) 25 MG tablet Take 64m (1 tablet) in the AM and 545m(2 tablets) in the PM   baclofen (LIORESAL) 10 MG tablet Take 1 tablet (10 mg total) by mouth 2 (two) times daily as needed for muscle spasms.   blood glucose meter kit and supplies KIT Dispense based on patient and insurance preference. Use up to four times daily as directed. (FOR ICD-9 250.00, 250.01).   ciclopirox (PENLAC) 8 % solution Apply topically at bedtime. Apply over nail & surrounding skin. Apply QD over previous coat.After7 days, may remove w alcohol, repeat   diclofenac (VOLTAREN) 75 MG EC tablet Take 1 tablet (75 mg total) by mouth 2 (two) times daily.   fluticasone (FLONASE) 50 MCG/ACT nasal spray Place 2 sprays into both nostrils daily.   furosemide (LASIX) 40 MG tablet Take 1 tablet (40 mg total) by mouth 2 (two) times daily.   gabapentin (NEURONTIN) 100 MG capsule Take 1 capsule (100 mg total) by mouth 3 (three) times daily.   HYDROcodone-acetaminophen (NORCO) 5-325 MG tablet Norco 5 mg-325 mg tablet  Take 1 tablet 3 times a day by oral route as needed.   hydrocortisone (ANUSOL-HC) 2.5 % rectal cream Place rectally as needed.   ketoconazole (NIZORAL) 2 % cream Apply 1 application topically daily. Apply to both  feet and between toes once daily for 6 weeks.   levocetirizine (XYZAL) 5 MG tablet Take 1 tablet (5 mg total) by mouth every evening. -- Office visit needed for further refills   linaclotide (LINZESS) 290 MCG CAPS capsule Take 1 capsule (290 mcg total) by mouth daily before breakfast.   meclizine (ANTIVERT) 25 MG tablet Take 1 tablet (25 mg total) by mouth 3 (three) times daily as needed for dizziness or nausea.   metFORMIN (GLUCOPHAGE) 500 MG tablet Take 1 tablet (500 mg total) by mouth daily with breakfast.   Multiple Vitamins-Minerals (MULTIVITAMIN WITH MINERALS) tablet Take 1 tablet by mouth daily.   NONFORMULARY OR COMPOUNDED ITEM [facility]:  Antifungal - Terbinafine 3%, Fluconazole 2%, Tea Tree Oin 5%, Urea 10%, Ibuprofen 2% in DMSO #[id number] to the affected nail(s) once (at bedtime) or twice daily.   omeprazole (PRILOSEC) 20 MG capsule Take  1 capsule (20 mg total) by mouth daily.   potassium chloride SA (K-DUR,KLOR-CON) 20 MEQ tablet Take 1 tablet (20 mEq total) by mouth 2 (two) times daily.   promethazine (PHENERGAN) 25 MG tablet Take 1 tablet (25 mg total) by mouth every 8 (eight) hours as needed for up to 7 days for nausea or vomiting (related to dizziness).   simvastatin (ZOCOR) 40 MG tablet Take 1 tablet (40 mg total) by mouth daily at 6 PM.   SUMAtriptan-naproxen (TREXIMET) 85-500 MG tablet Take 1 tablet by mouth every 2 (two) hours as needed for migraine.   Vitamin D, Ergocalciferol, (DRISDOL) 1.25 MG (50000 UT) CAPS capsule Take 1 capsule (50,000 Units total) by mouth every 7 (seven) days.   [DISCONTINUED] oxycodone (OXY-IR) 5 MG capsule Take 5 mg by mouth every 4 (four) hours as needed. For pain.   No facility-administered encounter medications on file as of 04/24/2019.      Review of Systems  Review of Systems   Physical Exam  There were no vitals taken for this visit.  Wt Readings from Last 5 Encounters:  10/19/18 238 lb 12.8 oz (108.3 kg)    08/28/18 231 lb (104.8 kg)  08/11/18 231 lb (104.8 kg)  08/07/18 231 lb (104.8 kg)  07/05/18 226 lb (102.5 kg)     Physical Exam   Lab Results:  CBC    Component Value Date/Time   WBC 6.6 02/09/2018 1053   WBC 7.2 05/07/2016 1229   RBC 4.46 02/09/2018 1053   RBC 4.45 05/07/2016 1229   HGB 12.4 02/09/2018 1053   HCT 38.6 02/09/2018 1053   PLT 249.0 05/07/2016 1229   MCV 87 02/09/2018 1053   MCH 27.8 02/09/2018 1053   MCHC 32.1 02/09/2018 1053   MCHC 33.1 05/07/2016 1229   RDW 15.4 02/09/2018 1053   LYMPHSABS 1.5 02/09/2018 1053   MONOABS 0.5 05/07/2016 1229   EOSABS 0.1 02/09/2018 1053   BASOSABS 0.0 02/09/2018 1053    BMET    Component Value Date/Time   NA 141 07/05/2018 1200   K 4.2 07/05/2018 1200   CL 101 07/05/2018 1200   CO2 24 07/05/2018 1200   GLUCOSE 97 07/05/2018 1200   GLUCOSE 96 09/27/2017 1204   GLUCOSE 89 09/20/2006 0915   BUN 16 07/05/2018 1200   CREATININE 0.72 07/05/2018 1200   CALCIUM 9.7 07/05/2018 1200   GFRNONAA 89 07/05/2018 1200   GFRAA 102 07/05/2018 1200    BNP No results found for: BNP  ProBNP    Component Value Date/Time   PROBNP 27.0 12/31/2014 1213      Assessment & Plan:   No problem-specific Assessment & Plan notes found for this encounter.    No follow-ups on file.   Lauraine Rinne, NP 04/23/2019   This appointment was *** minutes long with over 50% of the time in direct face-to-face patient care, assessment, plan of care, and follow-up.

## 2019-04-24 ENCOUNTER — Ambulatory Visit: Payer: Medicare Other | Admitting: Pulmonary Disease

## 2019-05-01 ENCOUNTER — Encounter: Payer: Self-pay | Admitting: Cardiology

## 2019-05-01 ENCOUNTER — Ambulatory Visit (INDEPENDENT_AMBULATORY_CARE_PROVIDER_SITE_OTHER): Payer: Medicare Other | Admitting: Internal Medicine

## 2019-05-01 ENCOUNTER — Encounter: Payer: Self-pay | Admitting: Internal Medicine

## 2019-05-01 ENCOUNTER — Telehealth: Payer: Self-pay

## 2019-05-01 DIAGNOSIS — J301 Allergic rhinitis due to pollen: Secondary | ICD-10-CM

## 2019-05-01 DIAGNOSIS — E119 Type 2 diabetes mellitus without complications: Secondary | ICD-10-CM

## 2019-05-01 DIAGNOSIS — Z20828 Contact with and (suspected) exposure to other viral communicable diseases: Secondary | ICD-10-CM

## 2019-05-01 DIAGNOSIS — J019 Acute sinusitis, unspecified: Secondary | ICD-10-CM | POA: Diagnosis not present

## 2019-05-01 DIAGNOSIS — Z20822 Contact with and (suspected) exposure to covid-19: Secondary | ICD-10-CM

## 2019-05-01 MED ORDER — ONDANSETRON HCL 4 MG PO TABS: 4.0000 mg | ORAL_TABLET | Freq: Three times a day (TID) | ORAL | 0 refills | Status: DC | PRN

## 2019-05-01 MED ORDER — HYDROCODONE-HOMATROPINE 5-1.5 MG/5ML PO SYRP: 5.0000 mL | ORAL_SOLUTION | Freq: Four times a day (QID) | ORAL | 0 refills | Status: AC | PRN

## 2019-05-01 MED ORDER — AZITHROMYCIN 250 MG PO TABS: ORAL_TABLET | ORAL | 1 refills | Status: DC

## 2019-05-01 NOTE — Assessment & Plan Note (Signed)
stable overall by history and exam, recent data reviewed with pt, and pt to continue medical treatment as before,  to f/u any worsening symptoms or concerns  

## 2019-05-01 NOTE — Progress Notes (Deleted)
Cardiology Office Note   Date:  05/01/2019   ID:  Erica [name], DOB Apr 28, 1956, MRN [id number]  PCP:  Erica Rail, MD  Cardiologist:   No primary care provider on file. Referring:  ***  No chief complaint on file.     History of Present Illness: Erica [name] is a 63 y.o. female who presents for ***    In 2018 an echo demonstrated NL EF and non significant abnormalities.  There was suggestion of mild diastolic dysfunction.  She did have a sleep study with mild sleep apnea.   ***    Past Medical History:  Diagnosis Date  . Abnormal stress test    a. 09/2016: NST showed a small defect of mild severity present in the basal inferoseptal and mid inferoseptal location, consistent with ischemia. --> medically managed  . ALLERGIC RHINITIS   . Anxiety   . Bursitis   . DDD (degenerative disc disease), lumbar    ESI with Ramos (spring 2016)  . Depression   . Diabetes mellitus without complication ([place])   . Dyslipidemia   . External hemorrhoids   . GERD (gastroesophageal reflux disease)   . Hypertension   . Obesity   . Osteoarthritis   . Palpitations    a. prior event monitor showing sinus tachycardia, no PAF.   Marland Kitchen Panic attacks    Hx of depression  . Sleep apnea    CPAP hs    Past Surgical History:  Procedure Laterality Date  . ABDOMINAL HYSTERECTOMY    . MASS EXCISION Left 12/30/2015   Procedure: EXCISION LEFT LEG MASS;  Surgeon: Donnie Mesa, MD;  Location: [facility];  Service: General;  Laterality: Left;  . REPLACEMENT TOTAL KNEE Right   . RIGHT OOPHORECTOMY     No cancer     Current Outpatient Medications  Medication Sig Dispense Refill  . albuterol (VENTOLIN HFA) 108 (90 Base) MCG/ACT inhaler Inhale 2 puffs into the lungs every 6 (six) hours as needed for wheezing or shortness of breath. 3 Inhaler 3  . ALPRAZolam (XANAX) 0.5 MG tablet Take 1 tablet (0.5 mg total) by mouth 2 (two) times daily as needed for anxiety. 60 tablet 1  .  atenolol (TENORMIN) 25 MG tablet Take 22m (1 tablet) in the AM and 540m(2 tablets) in the PM 270 tablet 0  . baclofen (LIORESAL) 10 MG tablet Take 1 tablet (10 mg total) by mouth 2 (two) times daily as needed for muscle spasms. 30 each 0  . blood glucose meter kit and supplies KIT Dispense based on patient and insurance preference. Use up to four times daily as directed. (FOR ICD-9 250.00, 250.01). 1 each 0  . ciclopirox (PENLAC) 8 % solution Apply topically at bedtime. Apply over nail & surrounding skin. Apply QD over previous coat.After7 days, may remove w alcohol, repeat 6.6 mL 3  . diclofenac (VOLTAREN) 75 MG EC tablet Take 1 tablet (75 mg total) by mouth 2 (two) times daily. 60 tablet 1  . fluticasone (FLONASE) 50 MCG/ACT nasal spray Place 2 sprays into both nostrils daily. 48 g 11  . furosemide (LASIX) 40 MG tablet Take 1 tablet (40 mg total) by mouth 2 (two) times daily. 180 tablet 1  . gabapentin (NEURONTIN) 100 MG capsule Take 1 capsule (100 mg total) by mouth 3 (three) times daily. 90 capsule 3  . HYDROcodone-acetaminophen (NORCO) 5-325 MG tablet Norco 5 mg-325 mg tablet  Take 1 tablet 3 times a day by  oral route as needed.    . hydrocortisone (ANUSOL-HC) 2.5 % rectal cream Place rectally as needed. 30 g 3  . ketoconazole (NIZORAL) 2 % cream Apply 1 application topically daily. Apply to both feet and between toes once daily for 6 weeks. 30 g 1  . levocetirizine (XYZAL) 5 MG tablet Take 1 tablet (5 mg total) by mouth every evening. -- Office visit needed for further refills 90 tablet 1  . linaclotide (LINZESS) 290 MCG CAPS capsule Take 1 capsule (290 mcg total) by mouth daily before breakfast. 90 capsule 1  . meclizine (ANTIVERT) 25 MG tablet Take 1 tablet (25 mg total) by mouth 3 (three) times daily as needed for dizziness or nausea. 180 tablet 3  . metFORMIN (GLUCOPHAGE) 500 MG tablet Take 1 tablet (500 mg total) by mouth daily with breakfast. 90 tablet 1  . Multiple Vitamins-Minerals  (MULTIVITAMIN WITH MINERALS) tablet Take 1 tablet by mouth daily.    . NONFORMULARY OR COMPOUNDED ITEM [facility]:  Antifungal - Terbinafine 3%, Fluconazole 2%, Tea Tree Oin 5%, Urea 10%, Ibuprofen 2% in DMSO #92m. Apply to the affected nail(s) once (at bedtime) or twice daily. 30 each 5  . omeprazole (PRILOSEC) 20 MG capsule Take 1 capsule (20 mg total) by mouth daily. 90 capsule 1  . potassium chloride SA (K-DUR,KLOR-CON) 20 MEQ tablet Take 1 tablet (20 mEq total) by mouth 2 (two) times daily. 180 tablet 1  . promethazine (PHENERGAN) 25 MG tablet Take 1 tablet (25 mg total) by mouth every 8 (eight) hours as needed for up to 7 days for nausea or vomiting (related to dizziness). 30 tablet 2  . simvastatin (ZOCOR) 40 MG tablet Take 1 tablet (40 mg total) by mouth daily at 6 PM. 90 tablet 1  . SUMAtriptan-naproxen (TREXIMET) 85-500 MG tablet Take 1 tablet by mouth every 2 (two) hours as needed for migraine. 10 tablet 8  . Vitamin D, Ergocalciferol, (DRISDOL) 1.25 MG (50000 UT) CAPS capsule Take 1 capsule (50,000 Units total) by mouth every 7 (seven) days. 4 capsule 0   No current facility-administered medications for this visit.     Allergies:   Codeine, Guaifenesin-codeine, and Wellbutrin [bupropion]    Social History:  The patient  reports that she has never smoked. She has never used smokeless tobacco. She reports current alcohol use of about 1.0 standard drinks of alcohol per week. She reports that she does not use drugs.   Family History:  The patient's ***family history includes Alcoholism in her father; Allergies in her sister; Asthma in her son and another family member; Breast cancer in her maternal aunt; Colon cancer in her cousin; Dementia in her father; Diabetes in her mother and son; Heart disease in her mother and son; Hyperlipidemia in her mother; Hypertension in her mother; Kidney failure in her father; Prostate cancer in her father and maternal uncle; Sleep apnea in her  mother; Thyroid disease in her mother.    ROS:  Please see the history of present illness.   Otherwise, review of systems are positive for {NONE DEFAULTED:18576::"none"}.   All other systems are reviewed and negative.    PHYSICAL EXAM: VS:  There were no vitals taken for this visit. , BMI There is no height or weight on file to calculate BMI. GENERAL:  Well appearing HEENT:  Pupils equal round and reactive, fundi not visualized, oral mucosa unremarkable NECK:  No jugular venous distention, waveform within normal limits, carotid upstroke brisk and symmetric, no bruits, no thyromegaly  LYMPHATICS:  No cervical, inguinal adenopathy LUNGS:  Clear to auscultation bilaterally BACK:  No CVA tenderness CHEST:  Unremarkable HEART:  PMI not displaced or sustained,S1 and S2 within normal limits, no S3, no S4, no clicks, no rubs, *** murmurs ABD:  Flat, positive bowel sounds normal in frequency in pitch, no bruits, no rebound, no guarding, no midline pulsatile mass, no hepatomegaly, no splenomegaly EXT:  2 plus pulses throughout, no edema, no cyanosis no clubbing SKIN:  No rashes no nodules NEURO:  Cranial nerves II through XII grossly intact, motor grossly intact throughout PSYCH:  Cognitively intact, oriented to person place and time    EKG:  EKG {ACTION; IS/IS RCV:[id number]} ordered today. The ekg ordered today demonstrates ***   Recent Labs: 07/05/2018: ALT 9; BUN 16; Creatinine, Ser 0.72; Potassium 4.2; Sodium 141    Lipid Panel    Component Value Date/Time   CHOL 183 02/09/2018 1053   TRIG 99 02/09/2018 1053   TRIG 51 09/20/2006 0915   HDL 55 02/09/2018 1053   CHOLHDL 4 09/27/2017 1204   VLDL 26.4 09/27/2017 1204   LDLCALC 108 (H) 02/09/2018 1053      Wt Readings from Last 3 Encounters:  10/19/18 238 lb 12.8 oz (108.3 kg)  08/28/18 231 lb (104.8 kg)  08/11/18 231 lb (104.8 kg)      Other studies Reviewed: Additional studies/ records that were reviewed today include: ***.  Review of the above records demonstrates:  Please see elsewhere in the note.  ***   ASSESSMENT AND PLAN:   PALPITATIONS:  ***  Given her previous workup with an absence of significant dysrhythmias I'm going to start just by adding an increased dose of atenolol in the evening. She actually takes her dose 25 mg twice a day and she's going to start taking 25 mg in the morning and 50 mg in the evening. She can come back in one month to see if this is changed her symptoms.  Of note she had a recent normal TSH.    ABNORMAL STRESS TEST:  ***  This was mildly abnormal results similar to the previous. She's not having any chest pain. She doesn't have significant risk factors. At this point I don't think further cardiovascular testing is suggested.  ABNORMAL EKG:  ***  Her EKG is changed from previous with poor anterior R wave motion and LAE suggested.  I will check an echocardiogram.    Current medicines are reviewed at length with the patient today.  The patient {ACTIONS; HAS/DOES NOT HAVE:19233} concerns regarding medicines.  The following changes have been made:  {PLAN; NO CHANGE:13088:s}  Labs/ tests ordered today include: *** No orders of the defined types were placed in this encounter.    Disposition:   FU with ***    Signed, Minus Breeding, MD  05/01/2019 10:00 AM    [facility]

## 2019-05-01 NOTE — Patient Instructions (Signed)
Please take all new medication as prescribed - the antibiotic, cough medicine, nausea medication  Please continue all other medications as before, and refills have been done if requested.  Please have the pharmacy call with any other refills you may need.  Please keep your appointments with your specialists as you may have planned  Please go to the [facility] testing site tomorrow for COVID testing

## 2019-05-01 NOTE — Assessment & Plan Note (Signed)
To restart flonase, allegra prn asd

## 2019-05-01 NOTE — Assessment & Plan Note (Addendum)
Mild to mod, for antibx course,  to f/u any worsening symptoms or concerns, for COVID testing referral

## 2019-05-01 NOTE — Progress Notes (Signed)
Patient ID: Erica [name], female   DOB: Nov 18, 1955, 63 y.o.   MRN: [id number]  Virtual Visit via Video Note  I connected with [name] on 05/01/19 at  7:00 PM EDT by a video enabled telemedicine application and verified that I am speaking with the correct person using two identifiers.  Location: Patient: at home Provider: at office   I discussed the limitations of evaluation and management by telemedicine and the availability of in person appointments. The patient expressed understanding and agreed to proceed.  History of Present Illness:  Here with 2-3 days acute onset fever, facial pain, pressure, headache, general weakness and malaise, and greenish d/c, with mild ST and cough, but pt denies chest pain, wheezing, increased sob or doe, orthopnea, PND, increased LE swelling, palpitations, dizziness or syncope.  Does have several wks ongoing nasal allergy symptoms with clearish congestion, itch and sneezing, without fever, pain, ST, cough, swelling or wheezing.   Pt denies polydipsia, polyuria Past Medical History:  Diagnosis Date  . Abnormal stress test    a. 09/2016: NST showed a small defect of mild severity present in the basal inferoseptal and mid inferoseptal location, consistent with ischemia. --> medically managed  . ALLERGIC RHINITIS   . Anxiety   . Bursitis   . DDD (degenerative disc disease), lumbar    ESI with Ramos (spring 2016)  . Depression   . Diabetes mellitus without complication ([place])   . Dyslipidemia   . External hemorrhoids   . GERD (gastroesophageal reflux disease)   . Hypertension   . Obesity   . Osteoarthritis   . Palpitations    a. prior event monitor showing sinus tachycardia, no PAF.   Marland Kitchen Panic attacks    Hx of depression  . Sleep apnea    CPAP hs   Past Surgical History:  Procedure Laterality Date  . ABDOMINAL HYSTERECTOMY    . MASS EXCISION Left 12/30/2015   Procedure: EXCISION LEFT LEG MASS;  Surgeon: Donnie Mesa, MD;  Location: [facility];  Service: General;  Laterality: Left;  . REPLACEMENT TOTAL KNEE Right   . RIGHT OOPHORECTOMY     No cancer    reports that she has never smoked. She has never used smokeless tobacco. She reports current alcohol use of about 1.0 standard drinks of alcohol per week. She reports that she does not use drugs. family history includes Alcoholism in her father; Allergies in her sister; Asthma in her son and another family member; Breast cancer in her maternal aunt; Colon cancer in her cousin; Dementia in her father; Diabetes in her mother and son; Heart disease in her mother and son; Hyperlipidemia in her mother; Hypertension in her mother; Kidney failure in her father; Prostate cancer in her father and maternal uncle; Sleep apnea in her mother; Thyroid disease in her mother. Allergies  Allergen Reactions  . Codeine Nausea And Vomiting  . Guaifenesin-Codeine Other (See Comments)    REACTION: feels spacey  . Wellbutrin [Bupropion] Palpitations    hallucinations   Current Outpatient Medications on File Prior to Visit  Medication Sig Dispense Refill  . albuterol (VENTOLIN HFA) 108 (90 Base) MCG/ACT inhaler Inhale 2 puffs into the lungs every 6 (six) hours as needed for wheezing or shortness of breath. 3 Inhaler 3  . ALPRAZolam (XANAX) 0.5 MG tablet Take 1 tablet (0.5 mg total) by mouth 2 (two) times daily as needed for anxiety. 60 tablet 1  . atenolol (TENORMIN) 25 MG tablet Take 13m (1  tablet) in the AM and 81m (2 tablets) in the PM 270 tablet 0  . baclofen (LIORESAL) 10 MG tablet Take 1 tablet (10 mg total) by mouth 2 (two) times daily as needed for muscle spasms. 30 each 0  . blood glucose meter kit and supplies KIT Dispense based on patient and insurance preference. Use up to four times daily as directed. (FOR ICD-9 250.00, 250.01). 1 each 0  . ciclopirox (PENLAC) 8 % solution Apply topically at bedtime. Apply over nail & surrounding skin. Apply QD over previous coat.After7  days, may remove w alcohol, repeat 6.6 mL 3  . diclofenac (VOLTAREN) 75 MG EC tablet Take 1 tablet (75 mg total) by mouth 2 (two) times daily. 60 tablet 1  . fluticasone (FLONASE) 50 MCG/ACT nasal spray Place 2 sprays into both nostrils daily. 48 g 11  . furosemide (LASIX) 40 MG tablet Take 1 tablet (40 mg total) by mouth 2 (two) times daily. 180 tablet 1  . gabapentin (NEURONTIN) 100 MG capsule Take 1 capsule (100 mg total) by mouth 3 (three) times daily. 90 capsule 3  . HYDROcodone-acetaminophen (NORCO) 5-325 MG tablet Norco 5 mg-325 mg tablet  Take 1 tablet 3 times a day by oral route as needed.    . hydrocortisone (ANUSOL-HC) 2.5 % rectal cream Place rectally as needed. 30 g 3  . ketoconazole (NIZORAL) 2 % cream Apply 1 application topically daily. Apply to both feet and between toes once daily for 6 weeks. 30 g 1  . levocetirizine (XYZAL) 5 MG tablet Take 1 tablet (5 mg total) by mouth every evening. -- Office visit needed for further refills 90 tablet 1  . linaclotide (LINZESS) 290 MCG CAPS capsule Take 1 capsule (290 mcg total) by mouth daily before breakfast. 90 capsule 1  . meclizine (ANTIVERT) 25 MG tablet Take 1 tablet (25 mg total) by mouth 3 (three) times daily as needed for dizziness or nausea. 180 tablet 3  . metFORMIN (GLUCOPHAGE) 500 MG tablet Take 1 tablet (500 mg total) by mouth daily with breakfast. 90 tablet 1  . Multiple Vitamins-Minerals (MULTIVITAMIN WITH MINERALS) tablet Take 1 tablet by mouth daily.    . NONFORMULARY OR COMPOUNDED ITEM [facility]:  Antifungal - Terbinafine 3%, Fluconazole 2%, Tea Tree Oin 5%, Urea 10%, Ibuprofen 2% in DMSO #374m Apply to the affected nail(s) once (at bedtime) or twice daily. 30 each 5  . omeprazole (PRILOSEC) 20 MG capsule Take 1 capsule (20 mg total) by mouth daily. 90 capsule 1  . potassium chloride SA (K-DUR,KLOR-CON) 20 MEQ tablet Take 1 tablet (20 mEq total) by mouth 2 (two) times daily. 180 tablet 1  . promethazine  (PHENERGAN) 25 MG tablet Take 1 tablet (25 mg total) by mouth every 8 (eight) hours as needed for up to 7 days for nausea or vomiting (related to dizziness). 30 tablet 2  . simvastatin (ZOCOR) 40 MG tablet Take 1 tablet (40 mg total) by mouth daily at 6 PM. 90 tablet 1  . SUMAtriptan-naproxen (TREXIMET) 85-500 MG tablet Take 1 tablet by mouth every 2 (two) hours as needed for migraine. 10 tablet 8  . Vitamin D, Ergocalciferol, (DRISDOL) 1.25 MG (50000 UT) CAPS capsule Take 1 capsule (50,000 Units total) by mouth every 7 (seven) days. 4 capsule 0  . [DISCONTINUED] oxycodone (OXY-IR) 5 MG capsule Take 5 mg by mouth every 4 (four) hours as needed. For pain.     No current facility-administered medications on file prior to visit.  Observations/Objective: Alert, NAD, mild ill appropriate mood and affect, resps normal, cn 2-12 intact, moves all 4s, no visible rash or swelling Lab Results  Component Value Date   WBC 6.6 02/09/2018   HGB 12.4 02/09/2018   HCT 38.6 02/09/2018   PLT 249.0 05/07/2016   GLUCOSE 97 07/05/2018   CHOL 183 02/09/2018   TRIG 99 02/09/2018   HDL 55 02/09/2018   LDLCALC 108 (H) 02/09/2018   ALT 9 07/05/2018   AST 9 07/05/2018   NA 141 07/05/2018   K 4.2 07/05/2018   CL 101 07/05/2018   CREATININE 0.72 07/05/2018   BUN 16 07/05/2018   CO2 24 07/05/2018   TSH 2.020 02/09/2018   HGBA1C 6.2 (H) 07/05/2018   MICROALBUR 1.7 03/28/2017   Assessment and Plan: See notes  Follow Up Instructions: See notes   I discussed the assessment and treatment plan with the patient. The patient was provided an opportunity to ask questions and all were answered. The patient agreed with the plan and demonstrated an understanding of the instructions.   The patient was advised to call back or seek an in-person evaluation if the symptoms worsen or if the condition fails to improve as anticipated.   Cathlean Cower, MD

## 2019-05-01 NOTE — Telephone Encounter (Signed)
7-22@320  [facility] APPT  CHANGED TO TELEPHONE VISIT [phone] PT IS SICK.

## 2019-05-02 ENCOUNTER — Telehealth: Payer: Self-pay | Admitting: Cardiology

## 2019-05-02 ENCOUNTER — Telehealth: Payer: Medicare Other | Admitting: Cardiology

## 2019-05-02 ENCOUNTER — Other Ambulatory Visit: Payer: Self-pay | Admitting: Internal Medicine

## 2019-05-02 DIAGNOSIS — R6889 Other general symptoms and signs: Secondary | ICD-10-CM | POA: Diagnosis not present

## 2019-05-02 DIAGNOSIS — Z20822 Contact with and (suspected) exposure to covid-19: Secondary | ICD-10-CM

## 2019-05-02 NOTE — Telephone Encounter (Signed)
Pt said she wants an office visit instead of a Virtual Visit. She will call back when she is feeling better.

## 2019-05-04 ENCOUNTER — Telehealth: Payer: Self-pay | Admitting: Internal Medicine

## 2019-05-04 ENCOUNTER — Other Ambulatory Visit: Payer: Self-pay | Admitting: Family Medicine

## 2019-05-04 DIAGNOSIS — M5432 Sciatica, left side: Secondary | ICD-10-CM

## 2019-05-04 MED ORDER — ATENOLOL 25 MG PO TABS: ORAL_TABLET | ORAL | 0 refills | Status: DC

## 2019-05-04 MED ORDER — SIMVASTATIN 40 MG PO TABS: 40.0000 mg | ORAL_TABLET | Freq: Every day | ORAL | 0 refills | Status: DC

## 2019-05-04 NOTE — Telephone Encounter (Signed)
Rx sent 

## 2019-05-04 NOTE — Telephone Encounter (Signed)
Medication Refill - Medication: simvastatin (ZOCOR) 40 MG tablet [[id number]]  atenolol (TENORMIN) 25 MG tablet [[id number]]    Has the patient contacted their pharmacy? No. (Agent: If no, request that the patient contact the pharmacy for the refill.) [facility] and [facility] has to fill it.   Preferred Pharmacy (with phone number or street name):  [facility] - [place], [place] - [address] (Phone) [phone] (Fax)     Agent: Please be advised that RX refills may take up to 3 business days. We ask that you follow-up with your pharmacy.

## 2019-05-06 LAB — NOVEL CORONAVIRUS, NAA: SARS-CoV-2, NAA: NOT DETECTED

## 2019-05-06 LAB — SPECIMEN STATUS REPORT

## 2019-05-07 ENCOUNTER — Telehealth: Payer: Self-pay

## 2019-05-07 ENCOUNTER — Other Ambulatory Visit: Payer: Self-pay

## 2019-05-07 ENCOUNTER — Encounter: Payer: Self-pay | Admitting: Family Medicine

## 2019-05-07 DIAGNOSIS — F32A Depression, unspecified: Secondary | ICD-10-CM

## 2019-05-07 DIAGNOSIS — F329 Major depressive disorder, single episode, unspecified: Secondary | ICD-10-CM

## 2019-05-07 MED ORDER — SIMVASTATIN 40 MG PO TABS: 40.0000 mg | ORAL_TABLET | Freq: Every day | ORAL | 0 refills | Status: DC

## 2019-05-07 MED ORDER — ATENOLOL 25 MG PO TABS: ORAL_TABLET | ORAL | 0 refills | Status: DC

## 2019-05-07 MED ORDER — FUROSEMIDE 40 MG PO TABS: 40.0000 mg | ORAL_TABLET | Freq: Two times a day (BID) | ORAL | 0 refills | Status: DC

## 2019-05-07 NOTE — Telephone Encounter (Addendum)
Pt also needs lasix 40 mg and 20 mg. Pt said she takes both mg.  Pt also needs 30 day supply of lasix 20 mg and 40 mg, simvastatin and atenolol please sent to [facility] on [address].

## 2019-05-07 NOTE — Telephone Encounter (Signed)
Is patient supposed to be taking all that lasix?

## 2019-05-07 NOTE — Telephone Encounter (Signed)
Psychiatry

## 2019-05-07 NOTE — Telephone Encounter (Signed)
-----   Message from Biagio Borg, MD sent at 05/06/2019  2:59 PM EDT ----- Left message on MyChart, pt to cont same tx  Shirron to please inform pt, COVID neg

## 2019-05-07 NOTE — Telephone Encounter (Signed)
Pt has been informed of results and expressed understanding.  °

## 2019-05-07 NOTE — Telephone Encounter (Signed)
I need to know what dose she is taking daily -- she is supposed to be taking 40 mg BID.   I will only write for one script - that is what the insurance will cover - it may be a 20 mg pill and she may have to take more than one depending on her dosing but we need to have her medication dose correct in her chart -- did someone advise her to change her dose?

## 2019-05-07 NOTE — Telephone Encounter (Signed)
Copied from [place] [id number]. Topic: Referral - Request for Referral >> May 07, 2019  9:17 AM Lennox Solders wrote: Has patient seen PCP for this complaint? Yes. Pt is requesting a referral to behavioral health for depression and anxiety

## 2019-05-07 NOTE — Telephone Encounter (Signed)
For therapy or psychiatry?

## 2019-05-08 ENCOUNTER — Other Ambulatory Visit: Payer: Medicare Other

## 2019-05-08 ENCOUNTER — Telehealth: Payer: Self-pay | Admitting: Internal Medicine

## 2019-05-08 DIAGNOSIS — J301 Allergic rhinitis due to pollen: Secondary | ICD-10-CM

## 2019-05-08 NOTE — Telephone Encounter (Signed)
Medication Refill - Medication:  levocetirizine (XYZAL) 5 MG tablet  Has the patient contacted their pharmacy? Yes advised to call.   Preferred Pharmacy (with phone number or street name):  [facility] - [place], [place] - [address] (Phone) [phone] (Fax)   Agent: Please be advised that RX refills may take up to 3 business days. We ask that you follow-up with your pharmacy.

## 2019-05-08 NOTE — Telephone Encounter (Signed)
Patient called in and stated atenolol (TENORMIN) 25 MG tablet , furosemide (LASIX) 40 MG tablet, and simvastatin (ZOCOR) 40 MG tablet was sent to wrong pharmacy. Patient is wanting script to be sent to the [facility] on [facility]. States she is wanting a 30 day supply sent in to [facility] than remainding 60 to be sent to [place].

## 2019-05-09 ENCOUNTER — Other Ambulatory Visit: Payer: Self-pay

## 2019-05-09 MED ORDER — ATENOLOL 25 MG PO TABS: ORAL_TABLET | ORAL | 0 refills | Status: DC

## 2019-05-09 MED ORDER — SIMVASTATIN 40 MG PO TABS: 40.0000 mg | ORAL_TABLET | Freq: Every day | ORAL | 0 refills | Status: DC

## 2019-05-09 MED ORDER — FUROSEMIDE 40 MG PO TABS: 40.0000 mg | ORAL_TABLET | Freq: Two times a day (BID) | ORAL | 0 refills | Status: DC

## 2019-05-09 MED ORDER — LEVOCETIRIZINE DIHYDROCHLORIDE 5 MG PO TABS: 5.0000 mg | ORAL_TABLET | Freq: Every evening | ORAL | 1 refills | Status: DC

## 2019-05-09 NOTE — Telephone Encounter (Signed)
Meds fixed and resent

## 2019-05-09 NOTE — Telephone Encounter (Signed)
Rx sent 

## 2019-05-11 ENCOUNTER — Emergency Department (HOSPITAL_BASED_OUTPATIENT_CLINIC_OR_DEPARTMENT_OTHER)
Admission: EM | Admit: 2019-05-11 | Discharge: 2019-05-11 | Disposition: A | Discharge status: Home / Self Care | Payer: Medicare Other | Attending: Emergency Medicine | Admitting: Emergency Medicine

## 2019-05-11 ENCOUNTER — Encounter (HOSPITAL_BASED_OUTPATIENT_CLINIC_OR_DEPARTMENT_OTHER): Payer: Self-pay | Admitting: *Deleted

## 2019-05-11 ENCOUNTER — Other Ambulatory Visit: Payer: Self-pay

## 2019-05-11 ENCOUNTER — Emergency Department (HOSPITAL_BASED_OUTPATIENT_CLINIC_OR_DEPARTMENT_OTHER): Payer: Medicare Other

## 2019-05-11 DIAGNOSIS — Z79899 Other long term (current) drug therapy: Secondary | ICD-10-CM | POA: Insufficient documentation

## 2019-05-11 DIAGNOSIS — R002 Palpitations: Secondary | ICD-10-CM | POA: Diagnosis not present

## 2019-05-11 DIAGNOSIS — Z7984 Long term (current) use of oral hypoglycemic drugs: Secondary | ICD-10-CM | POA: Diagnosis not present

## 2019-05-11 DIAGNOSIS — Z96651 Presence of right artificial knee joint: Secondary | ICD-10-CM | POA: Diagnosis not present

## 2019-05-11 DIAGNOSIS — T50905A Adverse effect of unspecified drugs, medicaments and biological substances, initial encounter: Secondary | ICD-10-CM

## 2019-05-11 DIAGNOSIS — T4395XA Adverse effect of unspecified psychotropic drug, initial encounter: Secondary | ICD-10-CM | POA: Diagnosis not present

## 2019-05-11 DIAGNOSIS — E119 Type 2 diabetes mellitus without complications: Secondary | ICD-10-CM | POA: Diagnosis not present

## 2019-05-11 DIAGNOSIS — E860 Dehydration: Secondary | ICD-10-CM | POA: Diagnosis not present

## 2019-05-11 DIAGNOSIS — F419 Anxiety disorder, unspecified: Secondary | ICD-10-CM | POA: Insufficient documentation

## 2019-05-11 DIAGNOSIS — T43595A Adverse effect of other antipsychotics and neuroleptics, initial encounter: Secondary | ICD-10-CM | POA: Diagnosis not present

## 2019-05-11 LAB — CBC WITH DIFFERENTIAL/PLATELET
Abs Immature Granulocytes: 0.01 10*3/uL (ref 0.00–0.07)
Basophils Absolute: 0 10*3/uL (ref 0.0–0.1)
Basophils Relative: 0 %
Eosinophils Absolute: 0 10*3/uL (ref 0.0–0.5)
Eosinophils Relative: 0 %
HCT: 40.8 % (ref 36.0–46.0)
Hemoglobin: 13 g/dL (ref 12.0–15.0)
Immature Granulocytes: 0 %
Lymphocytes Relative: 19 %
Lymphs Abs: 1.4 10*3/uL (ref 0.7–4.0)
MCH: 27.6 pg (ref 26.0–34.0)
MCHC: 31.9 g/dL (ref 30.0–36.0)
MCV: 86.6 fL (ref 80.0–100.0)
Monocytes Absolute: 0.5 10*3/uL (ref 0.1–1.0)
Monocytes Relative: 7 %
Neutro Abs: 5.3 10*3/uL (ref 1.7–7.7)
Neutrophils Relative %: 74 %
Platelets: 272 10*3/uL (ref 150–400)
RBC: 4.71 MIL/uL (ref 3.87–5.11)
RDW: 13.9 % (ref 11.5–15.5)
WBC: 7.2 10*3/uL (ref 4.0–10.5)
nRBC: 0 % (ref 0.0–0.2)

## 2019-05-11 LAB — COMPREHENSIVE METABOLIC PANEL
ALT: 12 U/L (ref 0–44)
AST: 16 U/L (ref 15–41)
Albumin: 4.1 g/dL (ref 3.5–5.0)
Alkaline Phosphatase: 131 U/L — ABNORMAL HIGH (ref 38–126)
Anion gap: 14 (ref 5–15)
BUN: 14 mg/dL (ref 8–23)
CO2: 23 mmol/L (ref 22–32)
Calcium: 9.7 mg/dL (ref 8.9–10.3)
Chloride: 101 mmol/L (ref 98–111)
Creatinine, Ser: 0.72 mg/dL (ref 0.44–1.00)
GFR calc Af Amer: >60 mL/min (ref >60)
GFR calc non Af Amer: >60 mL/min (ref >60)
Glucose, Bld: 134 mg/dL — ABNORMAL HIGH (ref 70–99)
Potassium: 3.7 mmol/L (ref 3.5–5.1)
Sodium: 138 mmol/L (ref 135–145)
Total Bilirubin: 0.5 mg/dL (ref 0.3–1.2)
Total Protein: 7.8 g/dL (ref 6.5–8.1)

## 2019-05-11 LAB — URINALYSIS, MICROSCOPIC (REFLEX)

## 2019-05-11 LAB — URINALYSIS, ROUTINE W REFLEX MICROSCOPIC
Glucose, UA: NEGATIVE mg/dL
Hgb urine dipstick: NEGATIVE
Ketones, ur: 15 mg/dL — AB
Leukocytes,Ua: NEGATIVE
Nitrite: NEGATIVE
Protein, ur: NEGATIVE mg/dL
Specific Gravity, Urine: >1.03 — ABNORMAL HIGH (ref 1.005–1.030)
pH: 6 (ref 5.0–8.0)

## 2019-05-11 LAB — RAPID URINE DRUG SCREEN, HOSP PERFORMED
Amphetamines: NOT DETECTED
Barbiturates: NOT DETECTED
Benzodiazepines: POSITIVE — AB
Cocaine: NOT DETECTED
Opiates: POSITIVE — AB
Tetrahydrocannabinol: NOT DETECTED

## 2019-05-11 LAB — TROPONIN I (HIGH SENSITIVITY): Troponin I (High Sensitivity): 5 ng/L (ref <18)

## 2019-05-11 LAB — TSH: TSH: 2.717 u[IU]/mL (ref 0.350–4.500)

## 2019-05-11 MED ORDER — ALPRAZOLAM 0.25 MG PO TABS: 0.2500 mg | ORAL_TABLET | Freq: Three times a day (TID) | ORAL | 0 refills | Status: DC | PRN

## 2019-05-11 MED ORDER — SODIUM CHLORIDE 0.9 % IV BOLUS
1000.0000 mL | Freq: Once | INTRAVENOUS | Status: AC
  Administered 2019-05-11: 11:00:00 1000 mL via INTRAVENOUS

## 2019-05-11 MED ORDER — ALPRAZOLAM 0.5 MG PO TABS
0.2500 mg | ORAL_TABLET | Freq: Once | ORAL | Status: AC
  Administered 2019-05-11: 0.25 mg via ORAL
  Filled 2019-05-11: qty 1

## 2019-05-11 NOTE — Discharge Instructions (Signed)
1.  Carefully reviewed your medications with your doctor and your psychiatrist.  Do not take any more doses of Quetiapine (seroquel). 2.  Take additional fluids and and stay hydrated. 3.  Take Xanax sparingly as needed for anxiety. 4.  Return to the emergency department if you have new worsening or changing symptoms. 5.  Follow-up with your doctor soon as possible for recheck

## 2019-05-11 NOTE — ED Provider Notes (Signed)
[facility] EMERGENCY DEPARTMENT Provider Note   CSN: [id number] Arrival date & time: 05/11/19  2683    History   Chief Complaint Chief Complaint  Patient presents with  . Palpitations    HPI Erica [name] is a 63 y.o. female.     HPI Patient reports that she is just felt terrible this week.  He has felt like her heart is racing at times.  She reports she woke up all sweaty this morning.  She reports that she feels like she is having some visual hallucinations of "people screaming".  She reports she just has not felt like herself.  She had called EMS on Monday and Tuesday this week.  She reports they checked her vital signs and heart rate and apparently everything checked out okay so she did not come to the hospital.  After much discussion, it came to light that the patient thought that she had gotten a prescription for Xanax but it was actually Seroquel.  She reports she has been taking it several times a day trying to help with her anxiety and the way she is feeling.  Patient became very upset when she became aware that this mixup had occurred.  She reports that he has had Seroquel in the past to try to help her sleep and she had a terrible reaction to it it made her feel horrible so she did not take it anymore. Past Medical History:  Diagnosis Date  . Abnormal stress test    a. 09/2016: NST showed a small defect of mild severity present in the basal inferoseptal and mid inferoseptal location, consistent with ischemia. --> medically managed  . ALLERGIC RHINITIS   . Anxiety   . Bursitis   . DDD (degenerative disc disease), lumbar    ESI with Ramos (spring 2016)  . Depression   . Diabetes mellitus without complication ([place])   . Dyslipidemia   . External hemorrhoids   . GERD (gastroesophageal reflux disease)   . Hypertension   . Obesity   . Osteoarthritis   . Palpitations    a. prior event monitor showing sinus tachycardia, no PAF.   Marland Kitchen Panic attacks    Hx of  depression  . Sleep apnea    CPAP hs    Patient Active Problem List   Diagnosis Date Noted  . Acute sinus infection 05/01/2019  . Sinus pressure 02/12/2019  . Acute pain of left knee 12/11/2018  . Class 2 severe obesity with serious comorbidity and body mass index (BMI) of 37.0 to 37.9 in adult ([place]) 08/31/2018  . Degeneration of lumbar intervertebral disc 05/30/2018  . Onychomycosis 12/07/2017  . Nausea without vomiting 12/07/2017  . Left hip pain 06/12/2017  . Acute left-sided low back pain without sciatica 06/12/2017  . Meningioma ([place]) 03/28/2017  . Panic attacks 09/27/2016  . GERD (gastroesophageal reflux disease) 09/27/2016  . Grief reaction 08/23/2016  . Right ankle pain 08/19/2016  . Diabetes ([place]) 02/15/2016  . Lump of skin 11/28/2015  . Migraine without aura and with status migrainosus, not intractable 10/03/2015  . Numbness of left hand 10/03/2015  . Cephalalgia 10/03/2015  . Abnormal ECG 09/11/2012  . Palpitations 09/08/2012  . Sciatica of left side   . Peripheral edema 05/09/2012  . FATIGUE 02/23/2010  . Obstructive sleep apnea 10/31/2009  . External hemorrhoids 10/17/2009  . CONSTIPATION, CHRONIC 10/17/2009  . Hyperlipidemia 01/12/2008  . Depression, major, recurrent ([place]) 01/12/2008  . Essential hypertension, benign 01/12/2008  . Allergic rhinitis 01/12/2008  Past Surgical History:  Procedure Laterality Date  . ABDOMINAL HYSTERECTOMY    . MASS EXCISION Left 12/30/2015   Procedure: EXCISION LEFT LEG MASS;  Surgeon: Donnie Mesa, MD;  Location: [facility];  Service: General;  Laterality: Left;  . REPLACEMENT TOTAL KNEE Right   . RIGHT OOPHORECTOMY     No cancer     OB History    Gravida  3   Para  3   Term  3   Preterm      AB      Living  3     SAB      TAB      Ectopic      Multiple      Live Births               Home Medications    Prior to Admission medications   Medication Sig Start Date End Date  Taking? Authorizing Provider  albuterol (VENTOLIN HFA) 108 (90 Base) MCG/ACT inhaler Inhale 2 puffs into the lungs every 6 (six) hours as needed for wheezing or shortness of breath. 02/12/19   [name], MD  ALPRAZolam Duanne Moron) 0.25 MG tablet Take 1 tablet (0.25 mg total) by mouth 3 (three) times daily as needed for anxiety. 05/11/19   Charlesetta Shanks, MD  ALPRAZolam Duanne Moron) 0.5 MG tablet Take 1 tablet (0.5 mg total) by mouth 2 (two) times daily as needed for anxiety. 08/17/16   Biagio Borg, MD  atenolol (TENORMIN) 25 MG tablet Take 60m (1 tablet) in the AM and 515m(2 tablets) in the PM 05/09/19   BuBinnie RailMD  azithromycin ([facility] MG tablet 2 tab by mouth day 1, then 1 per day 05/01/19   JoBiagio BorgMD  baclofen (LIORESAL) 10 MG tablet TAKE ONE TABLET BY MOUTH TWICE A DAY AS NEEDED FOR MUSCLE SPASMS 05/07/19   ScRosemarie AxMD  blood glucose meter kit and supplies KIT Dispense based on patient and insurance preference. Use up to four times daily as directed. (FOR ICD-9 250.00, 250.01). 03/23/16   BuBinnie RailMD  ciclopirox (PENLAC) 8 % solution Apply topically at bedtime. Apply over nail & surrounding skin. Apply QD over previous coat.After7 days, may remove w alcohol, repeat 12/07/17   BuBinnie RailMD  diclofenac (VOLTAREN) 75 MG EC tablet Take 1 tablet (75 mg total) by mouth 2 (two) times daily. 03/16/19 03/15/20  ScRosemarie AxMD  fluticasone (FLONASE) 50 MCG/ACT nasal spray Place 2 sprays into both nostrils daily. 09/27/17   BuBinnie RailMD  furosemide (LASIX) 40 MG tablet Take 1 tablet (40 mg total) by mouth 2 (two) times daily. 05/09/19   BuBinnie RailMD  gabapentin (NEURONTIN) 100 MG capsule Take 1 capsule (100 mg total) by mouth 3 (three) times daily. 12/14/18   ScRosemarie AxMD  HYDROcodone-acetaminophen (NORCO) 5-325 MG tablet Norco 5 mg-325 mg tablet  Take 1 tablet 3 times a day by oral route as needed.    [provider]   HYDROcodone-homatropine ([facility]-1.5 MG/5ML syrup Take 5 mLs by mouth every 6 (six) hours as needed for up to 10 days for cough. 05/01/19 05/11/19  JoBiagio BorgMD  hydrocortisone (ANUSOL-HC) 2.5 % rectal cream Place rectally as needed. 02/12/19   BuBinnie RailMD  ketoconazole (NIZORAL) 2 % cream Apply 1 application topically daily. Apply to both feet and between toes once daily for 6 weeks. 11/07/18  Marzetta Board, DPM  levocetirizine (XYZAL) 5 MG tablet Take 1 tablet (5 mg total) by mouth every evening. 05/09/19   [name], MD  linaclotide [facility]) 290 MCG CAPS capsule Take 1 capsule (290 mcg total) by mouth daily before breakfast. 02/12/19   Burns, Claudina Lick, MD  meclizine (ANTIVERT) 25 MG tablet Take 1 tablet (25 mg total) by mouth 3 (three) times daily as needed for dizziness or nausea. 03/23/16   [name], MD  metFORMIN (GLUCOPHAGE) 500 MG tablet Take 1 tablet (500 mg total) by mouth daily with breakfast. 04/02/19   [name], MD  Multiple Vitamins-Minerals (MULTIVITAMIN WITH MINERALS) tablet Take 1 tablet by mouth daily.    [provider]  NONFORMULARY OR COMPOUNDED ITEM [facility]:  Antifungal - Terbinafine 3%, Fluconazole 2%, Tea Tree Oin 5%, Urea 10%, Ibuprofen 2% in DMSO #7m. Apply to the affected nail(s) once (at bedtime) or twice daily. 11/08/18   GMarzetta Board DPM  omeprazole (PRILOSEC) 20 MG capsule Take 1 capsule (20 mg total) by mouth daily. 02/12/19   BBinnie Rail MD  ondansetron (ZOFRAN) 4 MG tablet Take 1 tablet (4 mg total) by mouth every 8 (eight) hours as needed for nausea or vomiting. 05/01/19   JBiagio Borg MD  potassium chloride SA (K-DUR,KLOR-CON) 20 MEQ tablet Take 1 tablet (20 mEq total) by mouth 2 (two) times daily. 02/14/18   BBinnie Rail MD  promethazine (PHENERGAN) 25 MG tablet Take 1 tablet (25 mg total) by mouth every 8 (eight) hours as needed for up to 7 days for nausea or vomiting (related to dizziness). 12/07/17  12/14/17  BBinnie Rail MD  simvastatin (ZOCOR) 40 MG tablet Take 1 tablet (40 mg total) by mouth daily at 6 PM. 05/09/19   Burns, SClaudina Lick MD  SUMAtriptan-naproxen (TREXIMET) 85-500 MG tablet Take 1 tablet by mouth every 2 (two) hours as needed for migraine. 08/11/18   BBinnie Rail MD  Vitamin D, Ergocalciferol, (DRISDOL) 1.25 MG (50000 UT) CAPS capsule Take 1 capsule (50,000 Units total) by mouth every 7 (seven) days. 08/28/18   BJearld LeschA, DO  oxycodone (OXY-IR) 5 MG capsule Take 5 mg by mouth every 4 (four) hours as needed. For pain.  02/27/15  [provider]    Family History Family History  Problem Relation Age of Onset  . Heart disease Mother        Enlarged Heart, Pacemaker  . Diabetes Mother   . Thyroid disease Mother   . Hyperlipidemia Mother   . Hypertension Mother   . Sleep apnea Mother   . Dementia Father   . Kidney failure Father   . Prostate cancer Father   . Alcoholism Father   . Asthma Other   . Allergies Sister   . Asthma Son   . Breast cancer Maternal Aunt        cousin  . Colon cancer Cousin   . Heart disease Son        CHF, morbid obesity  . Prostate cancer Maternal Uncle   . Diabetes Son     Social History Social History   Tobacco Use  . Smoking status: Never Smoker  . Smokeless tobacco: Never Used  Substance Use Topics  . Alcohol use: Yes    Alcohol/week: 1.0 standard drinks    Types: 1 Glasses of wine per week    Comment: one drink a month  . Drug use: No  Allergies   Codeine, Guaifenesin-codeine, and Wellbutrin [bupropion]   Review of Systems Review of Systems 10 Systems reviewed and are negative for acute change except as noted in the HPI.  Physical Exam Updated Vital Signs BP 140/89 (BP Location: Left Arm)   Pulse 68   Temp 99.6 F (37.6 C) (Oral)   Resp 16   Ht _0  (1.702 m)   Wt 108.9 kg   SpO2 100%   BMI 37.59 kg/m   Physical Exam Constitutional:      Comments: Patient is alert and nontoxic.  No  respiratory distress.  She is appropriate.  She appears a little anxious but no signs of active hallucination or delirium.  HENT:     Head: Normocephalic and atraumatic.     Mouth/Throat:     Mouth: Mucous membranes are moist.     Pharynx: Oropharynx is clear.  Eyes:     Extraocular Movements: Extraocular movements intact.     Pupils: Pupils are equal, round, and reactive to light.  Neck:     Musculoskeletal: Neck supple.  Cardiovascular:     Rate and Rhythm: Normal rate and regular rhythm.     Pulses: Normal pulses.     Heart sounds: Normal heart sounds.  Pulmonary:     Effort: Pulmonary effort is normal.     Breath sounds: Normal breath sounds.  Abdominal:     General: There is no distension.     Palpations: Abdomen is soft.     Tenderness: There is no abdominal tenderness.  Musculoskeletal: Normal range of motion.        General: No swelling or tenderness.     Right lower leg: No edema.     Left lower leg: No edema.     Comments: Condition of feet and lower extremities good.  No wounds or cellulitis.  Skin:    General: Skin is warm and dry.  Neurological:     General: No focal deficit present.     Mental Status: She is oriented to person, place, and time.     Cranial Nerves: No cranial nerve deficit.     Coordination: Coordination normal.     Comments: Cognitive function is normal.  Speech is clear.  Psychiatric:     Comments: Patient is mildly anxious but she is appropriate.  She is interacting appropriately.  No signs of withdrawal or responding to internal stimuli.      ED Treatments / Results  Labs (all labs ordered are listed, but only abnormal results are displayed) Labs Reviewed  COMPREHENSIVE METABOLIC PANEL - Abnormal; Notable for the following components:      Result Value   Glucose, Bld 134 (*)    Alkaline Phosphatase 131 (*)    All other components within normal limits  URINALYSIS, ROUTINE W REFLEX MICROSCOPIC - Abnormal; Notable for the following  components:   APPearance TURBID (*)    Specific Gravity, Urine >1.030 (*)    Bilirubin Urine SMALL (*)    Ketones, ur 15 (*)    All other components within normal limits  RAPID URINE DRUG SCREEN, HOSP PERFORMED - Abnormal; Notable for the following components:   Opiates POSITIVE (*)    Benzodiazepines POSITIVE (*)    All other components within normal limits  URINALYSIS, MICROSCOPIC (REFLEX) - Abnormal; Notable for the following components:   Bacteria, UA RARE (*)    All other components within normal limits  URINE CULTURE  CBC WITH DIFFERENTIAL/PLATELET  TSH  TROPONIN I (HIGH SENSITIVITY)  TROPONIN I (HIGH SENSITIVITY)    EKG EKG Interpretation  Date/Time:  Friday May 11 2019 09:15:36 EDT Ventricular Rate:  73 PR Interval:    QRS Duration: 90 QT Interval:  364 QTC Calculation: 402 R Axis:   12 Text Interpretation:  Sinus rhythm Borderline T abnormalities, anterior leads no change from previous Confirmed by Charlesetta Shanks [phone]) on 05/11/2019 9:25:41 AM   Radiology Dg Chest 2 View  Result Date: 05/11/2019 CLINICAL DATA:  Palpitations EXAM: CHEST - 2 VIEW COMPARISON:  03/08/2017 FINDINGS: Normal heart size and mediastinal contours. Artifact from EKG leads. There is no edema, consolidation, effusion, or pneumothorax. IMPRESSION: No active cardiopulmonary disease. Electronically Signed   By: Monte Fantasia M.D.   On: 05/11/2019 09:32    Procedures Procedures (including critical care time)  Medications Ordered in ED Medications  ALPRAZolam Duanne Moron) tablet 0.25 mg (0.25 mg Oral Given 05/11/19 1003)  sodium chloride 0.9 % bolus 1,000 mL (0 mLs Intravenous Stopped 05/11/19 1232)     Initial Impression / Assessment and Plan / ED Course  I have reviewed the triage vital signs and the nursing notes.  Pertinent labs & imaging results that were available during my care of the patient were reviewed by me and considered in my medical decision making (see chart for details).        Patient feels much improved.  She has had rehydration and a dose of Xanax orally.  Patient has been taking Seroquel erroneously, believing that it was her Xanax.  This is very likely the cause of her symptoms.  She was taking 2 tablets at a time a couple times a day.  She describes previously having had significant intolerance to Seroquel with just dose of 1 tablet at a time.  Patient shows no signs of delirium.  Her mental status is clear.  Diagnostic work-up is unremarkable.  Urinalysis did suggest dehydration.  Patient was rehydrated and has normal vital signs.  Patient's daughter is present with her and involved in assistance in care.  At this time she is stable for discharge.  Return precautions reviewed.  Patient is counseled on close follow-up with her PCP and her psychiatrist to make sure her medications are managed such that she does not run out early for medications that could precipitate withdrawal.  Final Clinical Impressions(s) / ED Diagnoses   Final diagnoses:  Adverse effect of drug, initial encounter  Dehydration  Anxiety    ED Discharge Orders         Ordered    ALPRAZolam (XANAX) 0.25 MG tablet  3 times daily PRN     05/11/19 1343           Charlesetta Shanks, MD 05/11/19 1347

## 2019-05-11 NOTE — ED Triage Notes (Signed)
Woke this am w increased sweating, palpation and hallucination  Denies chest pain

## 2019-05-12 LAB — URINE CULTURE: Culture: <10000 — AB

## 2019-05-14 ENCOUNTER — Other Ambulatory Visit: Payer: Self-pay | Admitting: Family Medicine

## 2019-05-14 DIAGNOSIS — M5432 Sciatica, left side: Secondary | ICD-10-CM

## 2019-05-14 MED ORDER — GABAPENTIN 100 MG PO CAPS: 100.0000 mg | ORAL_CAPSULE | Freq: Three times a day (TID) | ORAL | 1 refills | Status: DC

## 2019-05-16 ENCOUNTER — Other Ambulatory Visit: Payer: Self-pay | Admitting: Family Medicine

## 2019-05-16 DIAGNOSIS — M5432 Sciatica, left side: Secondary | ICD-10-CM

## 2019-05-16 MED ORDER — GABAPENTIN 100 MG PO CAPS: 100.0000 mg | ORAL_CAPSULE | Freq: Three times a day (TID) | ORAL | 1 refills | Status: DC

## 2019-05-17 DIAGNOSIS — R441 Visual hallucinations: Secondary | ICD-10-CM | POA: Insufficient documentation

## 2019-05-17 NOTE — Patient Instructions (Addendum)
  We checked your a1c and urine today.    Prevnar pneumonia immunization administered today.   Medications reviewed and updated.  Changes include :   none  Your prescription(s) have been submitted to your pharmacy. Please take as directed and contact our office if you believe you are having problem(s) with the medication(s).  A bone density was ordered.    Please followup in 6 months    [facility]  [address] [place], [place]  [place]            [address] [place], [place]

## 2019-05-17 NOTE — Progress Notes (Signed)
Subjective:    Patient ID: Erica [name] Alert, female    DOB: Feb 26, 1956, 63 y.o.   MRN: [id number]  HPI The patient is here for follow up from the hospital.   ED on 05/11/19 for palpitations.  She had intermittent heart racing for one week.  She woke up sweaty the morning she went to the ED.  She reported visual hallucinations of people screaming.  She did not feel like herself.  She had called EMS two days earlier that week and they came and checked her vitals - everything was normal.  She was taking what she thought was xanax several times a day to help with anxiety, but realized it was seroquel.  She was upset when she realized this mixup occurred.  She was on seroquel in the past for sleep, but she had a terrible reaction to it and made her feel horrible.  She received a dose of xanax and IVF in the ED and felt better.    The seroquel was likely the cause of her symptoms.  UA showed dehydration.  Other blood work was unremarkable.  She was discharged home with xanax.  She was advised follow up with me and her psychiatrist.   She still feels a little sweaty - it is a lot better.  She has some heart racing at times and will make an appointment with her cardiologist - she is due. She denies hallucinations.  She does feel much better.  She is back on the xanax and her anxiety is controlled.    Diabetes: She is taking her medication daily as prescribed. She is compliant with a diabetic diet.   She is up-to-date with an ophthalmology examination. She is not exercising regularly.     Hypertension: She is taking her medication daily. She is compliant with a low sodium diet.  She denies chest pain, edema, shortness of breath and regular headaches.     Medications and allergies reviewed with patient and updated if appropriate.  Patient Active Problem List   Diagnosis Date Noted  . Visual hallucinations 05/17/2019  . Acute sinus infection 05/01/2019  . Sinus pressure 02/12/2019  . Acute pain  of left knee 12/11/2018  . Class 2 severe obesity with serious comorbidity and body mass index (BMI) of 37.0 to 37.9 in adult ([place]) 08/31/2018  . Degeneration of lumbar intervertebral disc 05/30/2018  . Onychomycosis 12/07/2017  . Nausea without vomiting 12/07/2017  . Left hip pain 06/12/2017  . Acute left-sided low back pain without sciatica 06/12/2017  . Meningioma ([place]) 03/28/2017  . Panic attacks 09/27/2016  . GERD (gastroesophageal reflux disease) 09/27/2016  . Grief reaction 08/23/2016  . Right ankle pain 08/19/2016  . Diabetes ([place]) 02/15/2016  . Lump of skin 11/28/2015  . Migraine without aura and with status migrainosus, not intractable 10/03/2015  . Numbness of left hand 10/03/2015  . Cephalalgia 10/03/2015  . Abnormal ECG 09/11/2012  . Palpitations 09/08/2012  . Sciatica of left side   . Peripheral edema 05/09/2012  . FATIGUE 02/23/2010  . Obstructive sleep apnea 10/31/2009  . External hemorrhoids 10/17/2009  . CONSTIPATION, CHRONIC 10/17/2009  . Hyperlipidemia 01/12/2008  . Depression, major, recurrent ([place]) 01/12/2008  . Essential hypertension, benign 01/12/2008  . Allergic rhinitis 01/12/2008    Current Outpatient Medications on File Prior to Visit  Medication Sig Dispense Refill  . albuterol (VENTOLIN HFA) 108 (90 Base) MCG/ACT inhaler Inhale 2 puffs into the lungs every 6 (six) hours as needed for wheezing or shortness  of breath. 3 Inhaler 3  . ALPRAZolam (XANAX) 0.25 MG tablet Take 1 tablet (0.25 mg total) by mouth 3 (three) times daily as needed for anxiety. (Patient taking differently: Take 1 mg by mouth 2 (two) times daily as needed for anxiety. ) 12 tablet 0  . atenolol (TENORMIN) 25 MG tablet Take 9m (1 tablet) in the AM and 547m(2 tablets) in the PM 180 tablet 0  . baclofen (LIORESAL) 10 MG tablet TAKE ONE TABLET BY MOUTH TWICE A DAY AS NEEDED FOR MUSCLE SPASMS 30 each 0  . blood glucose meter kit and supplies KIT Dispense based on patient and insurance  preference. Use up to four times daily as directed. (FOR ICD-9 250.00, 250.01). 1 each 0  . ciclopirox (PENLAC) 8 % solution Apply topically at bedtime. Apply over nail & surrounding skin. Apply QD over previous coat.After7 days, may remove w alcohol, repeat 6.6 mL 3  . diclofenac (VOLTAREN) 75 MG EC tablet Take 1 tablet (75 mg total) by mouth 2 (two) times daily. 60 tablet 1  . fluticasone (FLONASE) 50 MCG/ACT nasal spray Place 2 sprays into both nostrils daily. 48 g 11  . furosemide (LASIX) 40 MG tablet Take 1 tablet (40 mg total) by mouth 2 (two) times daily. 120 tablet 0  . gabapentin (NEURONTIN) 100 MG capsule Take 1 capsule (100 mg total) by mouth 3 (three) times daily. 90 capsule 1  . hydrocortisone (ANUSOL-HC) 2.5 % rectal cream Place rectally as needed. 30 g 3  . ketoconazole (NIZORAL) 2 % cream Apply 1 application topically daily. Apply to both feet and between toes once daily for 6 weeks. 30 g 1  . levocetirizine (XYZAL) 5 MG tablet Take 1 tablet (5 mg total) by mouth every evening. 90 tablet 1  . linaclotide (LINZESS) 290 MCG CAPS capsule Take 1 capsule (290 mcg total) by mouth daily before breakfast. 90 capsule 1  . meclizine (ANTIVERT) 25 MG tablet Take 1 tablet (25 mg total) by mouth 3 (three) times daily as needed for dizziness or nausea. 180 tablet 3  . metFORMIN (GLUCOPHAGE) 500 MG tablet Take 1 tablet (500 mg total) by mouth daily with breakfast. 90 tablet 1  . Multiple Vitamins-Minerals (MULTIVITAMIN WITH MINERALS) tablet Take 1 tablet by mouth daily.    . NONFORMULARY OR COMPOUNDED ITEM [facility]:  Antifungal - Terbinafine 3%, Fluconazole 2%, Tea Tree Oin 5%, Urea 10%, Ibuprofen 2% in DMSO #[id number] to the affected nail(s) once (at bedtime) or twice daily. 30 each 5  . omeprazole (PRILOSEC) 20 MG capsule Take 1 capsule (20 mg total) by mouth daily. 90 capsule 1  . ondansetron (ZOFRAN) 4 MG tablet Take 1 tablet (4 mg total) by mouth every 8 (eight) hours as needed for  nausea or vomiting. 40 tablet 0  . potassium chloride SA (K-DUR,KLOR-CON) 20 MEQ tablet Take 1 tablet (20 mEq total) by mouth 2 (two) times daily. 180 tablet 1  . simvastatin (ZOCOR) 40 MG tablet Take 1 tablet (40 mg total) by mouth daily at 6 PM. 60 tablet 0  . SUMAtriptan-naproxen (TREXIMET) 85-500 MG tablet Take 1 tablet by mouth every 2 (two) hours as needed for migraine. 10 tablet 8  . Vitamin D, Ergocalciferol, (DRISDOL) 1.25 MG (50000 UT) CAPS capsule Take 1 capsule (50,000 Units total) by mouth every 7 (seven) days. 4 capsule 0  . promethazine (PHENERGAN) 25 MG tablet Take 1 tablet (25 mg total) by mouth every 8 (eight) hours as needed for up to  7 days for nausea or vomiting (related to dizziness). 30 tablet 2  . [DISCONTINUED] oxycodone (OXY-IR) 5 MG capsule Take 5 mg by mouth every 4 (four) hours as needed. For pain.     No current facility-administered medications on file prior to visit.     Past Medical History:  Diagnosis Date  . Abnormal stress test    a. 09/2016: NST showed a small defect of mild severity present in the basal inferoseptal and mid inferoseptal location, consistent with ischemia. --> medically managed  . ALLERGIC RHINITIS   . Anxiety   . Bursitis   . DDD (degenerative disc disease), lumbar    ESI with [facility] (spring 2016)  . Depression   . Diabetes mellitus without complication ([place])   . Dyslipidemia   . External hemorrhoids   . GERD (gastroesophageal reflux disease)   . Hypertension   . Obesity   . Osteoarthritis   . Palpitations    a. prior event monitor showing sinus tachycardia, no PAF.   Marland Kitchen Panic attacks    Hx of depression  . Sleep apnea    CPAP hs    Past Surgical History:  Procedure Laterality Date  . ABDOMINAL HYSTERECTOMY    . MASS EXCISION Left 12/30/2015   Procedure: EXCISION LEFT LEG MASS;  Surgeon: Donnie Mesa, MD;  Location: [facility];  Service: General;  Laterality: Left;  . REPLACEMENT TOTAL KNEE Right   . RIGHT  OOPHORECTOMY     No cancer    Social History   Socioeconomic History  . Marital status: Married    Spouse name: [name]  . Number of children: 3  . Years of education: Not on file  . Highest education level: Not on file  Occupational History  . Occupation: Disabled  Social Needs  . Financial resource strain: Not on file  . Food insecurity    Worry: Not on file    Inability: Not on file  . Transportation needs    Medical: Not on file    Non-medical: Not on file  Tobacco Use  . Smoking status: Never Smoker  . Smokeless tobacco: Never Used  Substance and Sexual Activity  . Alcohol use: Yes    Alcohol/week: 1.0 standard drinks    Types: 1 Glasses of wine per week    Comment: one drink a month  . Drug use: No  . Sexual activity: Not Currently  Lifestyle  . Physical activity    Days per week: Not on file    Minutes per session: Not on file  . Stress: Not on file  Relationships  . Social Herbalist on phone: Not on file    Gets together: Not on file    Attends religious service: Not on file    Active member of club or organization: Not on file    Attends meetings of clubs or organizations: Not on file    Relationship status: Not on file  Other Topics Concern  . Not on file  Social History Narrative   Married with children. Pt is on disablity. Previous worked in the school system    Family History  Problem Relation Age of Onset  . Heart disease Mother        Enlarged Heart, Pacemaker  . Diabetes Mother   . Thyroid disease Mother   . Hyperlipidemia Mother   . Hypertension Mother   . Sleep apnea Mother   . Dementia Father   . Kidney failure Father   .  Prostate cancer Father   . Alcoholism Father   . Asthma Other   . Allergies Sister   . Asthma Son   . Breast cancer Maternal Aunt        cousin  . Colon cancer Cousin   . Heart disease Son        CHF, morbid obesity  . Prostate cancer Maternal Uncle   . Diabetes Son     Review of Systems   Constitutional: Positive for diaphoresis. Negative for chills and fever.  Respiratory: Negative for cough, shortness of breath and wheezing.   Cardiovascular: Positive for palpitations. Negative for chest pain and leg swelling.  Neurological: Negative for dizziness, light-headedness and headaches.  Psychiatric/Behavioral: Negative for hallucinations.       Objective:   Vitals:   05/18/19 0959  BP: 134/86  Pulse: 78  Resp: 16  Temp: 99.2 F (37.3 C)  SpO2: 97%   BP Readings from Last 3 Encounters:  05/18/19 134/86  05/11/19 124/85  03/16/19 114/72   Wt Readings from Last 3 Encounters:  05/18/19 242 lb (109.8 kg)  05/11/19 240 lb (108.9 kg)  10/19/18 238 lb 12.8 oz (108.3 kg)   Body mass index is 37.9 kg/m.   Physical Exam    Constitutional: Appears well-developed and well-nourished. No distress.  HENT:  Head: Normocephalic and atraumatic.  Neck: Neck supple. No tracheal deviation present. No thyromegaly present.  No cervical lymphadenopathy Cardiovascular: Normal rate, regular rhythm and normal heart sounds.  No murmur heard. No carotid bruit .  No edema Pulmonary/Chest: Effort normal and breath sounds normal. No respiratory distress. No has no wheezes. No rales.  Skin: Skin is warm and dry. Not diaphoretic.  Psychiatric: Normal mood and affect. Behavior is normal.  Thought process and judgement normal.      Assessment & Plan:    See Problem List for Assessment and Plan of chronic medical problems.

## 2019-05-18 ENCOUNTER — Encounter: Payer: Self-pay | Admitting: Internal Medicine

## 2019-05-18 ENCOUNTER — Ambulatory Visit (INDEPENDENT_AMBULATORY_CARE_PROVIDER_SITE_OTHER)
Admission: RE | Admit: 2019-05-18 | Discharge: 2019-05-18 | Disposition: A | Discharge status: Home / Self Care | Payer: Medicare Other | Source: Ambulatory Visit | Attending: Internal Medicine | Admitting: Internal Medicine

## 2019-05-18 ENCOUNTER — Other Ambulatory Visit: Payer: Self-pay

## 2019-05-18 ENCOUNTER — Ambulatory Visit (INDEPENDENT_AMBULATORY_CARE_PROVIDER_SITE_OTHER): Payer: Medicare Other | Admitting: Internal Medicine

## 2019-05-18 ENCOUNTER — Other Ambulatory Visit: Payer: Medicare Other

## 2019-05-18 VITALS — BP 134/86 | HR 78 | Temp 99.2°F | Resp 16 | Ht 67.0 in | Wt 242.0 lb

## 2019-05-18 DIAGNOSIS — I10 Essential (primary) hypertension: Secondary | ICD-10-CM

## 2019-05-18 DIAGNOSIS — E2839 Other primary ovarian failure: Secondary | ICD-10-CM | POA: Diagnosis not present

## 2019-05-18 DIAGNOSIS — R441 Visual hallucinations: Secondary | ICD-10-CM | POA: Diagnosis not present

## 2019-05-18 DIAGNOSIS — Z13820 Encounter for screening for osteoporosis: Secondary | ICD-10-CM | POA: Diagnosis not present

## 2019-05-18 DIAGNOSIS — Z23 Encounter for immunization: Secondary | ICD-10-CM

## 2019-05-18 DIAGNOSIS — E119 Type 2 diabetes mellitus without complications: Secondary | ICD-10-CM

## 2019-05-18 DIAGNOSIS — R002 Palpitations: Secondary | ICD-10-CM

## 2019-05-18 DIAGNOSIS — F410 Panic disorder [episodic paroxysmal anxiety] without agoraphobia: Secondary | ICD-10-CM | POA: Diagnosis not present

## 2019-05-18 LAB — POCT GLYCOSYLATED HEMOGLOBIN (HGB A1C): Hemoglobin A1C: 6.1 % — AB (ref 4.0–5.6)

## 2019-05-18 LAB — MICROALBUMIN / CREATININE URINE RATIO
Creatinine,U: 237.1 mg/dL
Microalb Creat Ratio: 0.3 mg/g (ref 0.0–30.0)
Microalb, Ur: <0.7 mg/dL (ref 0.0–1.9)

## 2019-05-18 MED ORDER — FLUTICASONE PROPIONATE 50 MCG/ACT NA SUSP: 2.0000 | Freq: Every day | NASAL | 11 refills | Status: DC

## 2019-05-18 MED ORDER — OMEPRAZOLE 20 MG PO CPDR: 20.0000 mg | DELAYED_RELEASE_CAPSULE | Freq: Every day | ORAL | 1 refills | Status: DC

## 2019-05-18 NOTE — Assessment & Plan Note (Signed)
A1c, urine micro Stressed regular exercise Continue metformin - will adjust if needed

## 2019-05-18 NOTE — Assessment & Plan Note (Signed)
Following with psychiatry Taking xanax as prescribed - will f/u with pysch May want to change to a different psychiatrist and wants to start seeing a therapist Given a couple of numbers to call

## 2019-05-18 NOTE — Addendum Note (Signed)
Addended by: Delice Bison E on: 05/18/2019 11:07 AM   Modules accepted: Orders

## 2019-05-18 NOTE — Assessment & Plan Note (Signed)
BP slightly elevated here today, but overall looks controlled  Current regimen effective and well tolerated Continue current medications at current doses

## 2019-05-18 NOTE — Assessment & Plan Note (Signed)
Secondary to seroquel - this has been stopped - added it to her allergy list Resolved after stopping medication

## 2019-05-18 NOTE — Assessment & Plan Note (Signed)
Has a history of palpitations - worse recently related to Seroquel and withdrawal from xanax Still has some heart racing Due to see cardiology and will schedule an appointment HR normal today, no CP or SOB

## 2019-05-19 ENCOUNTER — Encounter: Payer: Self-pay | Admitting: Internal Medicine

## 2019-05-19 DIAGNOSIS — M8580 Other specified disorders of bone density and structure, unspecified site: Secondary | ICD-10-CM | POA: Insufficient documentation

## 2019-05-24 ENCOUNTER — Telehealth: Payer: Self-pay | Admitting: Pulmonary Disease

## 2019-05-24 NOTE — Telephone Encounter (Signed)
error 

## 2019-05-25 ENCOUNTER — Ambulatory Visit (INDEPENDENT_AMBULATORY_CARE_PROVIDER_SITE_OTHER): Payer: Medicare Other | Admitting: Pulmonary Disease

## 2019-05-25 ENCOUNTER — Other Ambulatory Visit: Payer: Self-pay

## 2019-05-25 ENCOUNTER — Encounter: Payer: Self-pay | Admitting: Pulmonary Disease

## 2019-05-25 VITALS — BP 114/74 | HR 68 | Temp 98.8°F | Ht 67.0 in | Wt 247.4 lb

## 2019-05-25 DIAGNOSIS — G4733 Obstructive sleep apnea (adult) (pediatric): Secondary | ICD-10-CM | POA: Diagnosis not present

## 2019-05-25 DIAGNOSIS — J301 Allergic rhinitis due to pollen: Secondary | ICD-10-CM

## 2019-05-25 NOTE — Patient Instructions (Addendum)
You were seen today by Lauraine Rinne, NP  for:   1. Obstructive sleep apnea  We recommend that you continue using your CPAP daily >>>Keep up the hard work using your device >>> Goal should be wearing this for the entire night that you are sleeping, at least 4 to 6 hours  Remember:  . Do not drive or operate heavy machinery if tired or drowsy.  . Please notify the supply company and office if you are unable to use your device regularly due to missing supplies or machine being broken.  . Work on maintaining a healthy weight and following your recommended nutrition plan  . Maintain proper daily exercise and movement  . Maintaining proper use of your device can also help improve management of other chronic illnesses such as: Blood pressure, blood sugars, and weight management.   BiPAP/ CPAP Cleaning:  >>>Clean weekly, with Dawn soap, and bottle brush.  Set up to air dry.  DME order: [facility]   - Cpap titration; Future  [facility] now [facility] >>>Contact number for them is Dimas Chyle [phone] or [phone] ext [phone]   2. Obesity ([place])  Continue to work on increasing physical activity to reduce BMI    We recommend today:  Orders Placed This Encounter  Procedures  . Cpap titration    Standing Status:   Future    Standing Expiration Date:   05/24/2020    Order Specific Question:   Where should this test be performed:    Answer:   [facility]   Orders Placed This Encounter  Procedures  . Cpap titration   No orders of the defined types were placed in this encounter.   Follow Up:    Return in about 2 months (around 07/25/2019), or if symptoms worsen or fail to improve, for Follow up with Dr. [name].   Please do your part to reduce the spread of COVID-19:      Reduce your risk of any infection  and COVID19 by using the similar precautions used for avoiding the common cold or flu:  Marland Kitchen Wash your hands often with soap and warm water for at least  20 seconds.  If soap and water are not readily available, use an alcohol-based hand sanitizer with at least 60% alcohol.  . If coughing or sneezing, cover your mouth and nose by coughing or sneezing into the elbow areas of your shirt or coat, into a tissue or into your sleeve (not your hands). Langley Gauss A MASK when in public  . Avoid shaking hands with others and consider head nods or verbal greetings only. . Avoid touching your eyes, nose, or mouth with unwashed hands.  . Avoid close contact with people who are sick. . Avoid places or events with large numbers of people in one location, like concerts or sporting events. . If you have some symptoms but not all symptoms, continue to monitor at home and seek medical attention if your symptoms worsen. . If you are having a medical emergency, call 911.   [place] / e-Visit: [email or web address]         [facility]: [phone]  [facility]: [phone]                   [facility]: [phone]     It is flu season:   >>> Best ways to protect herself from the  flu: Receive the yearly flu vaccine, practice good hand hygiene washing with soap and also using hand sanitizer when available, eat a nutritious meals, get adequate rest, hydrate appropriately   Please contact the office if your symptoms worsen or you have concerns that you are not improving.   Thank you for choosing [facility] for your healthcare, and for allowing [place] to partner with you on your healthcare journey. I am thankful to be able to provide care to you today.   Erica Quaker FNP-C     Living With Sleep Apnea Sleep apnea is a condition in which breathing pauses or becomes shallow during sleep. Sleep apnea is most commonly caused by a collapsed or blocked airway. People with sleep apnea snore loudly and have times when they  gasp and stop breathing for 10 seconds or more during sleep. This happens over and over during the night. This disrupts your sleep and keeps your body from getting the rest that it needs, which can cause tiredness and lack of energy (fatigue) during the day. The breaks in breathing also interrupt the deep sleep that you need to feel rested. Even if you do not completely wake up from the gaps in breathing, your sleep may not be restful. You may also have a headache in the morning and low energy during the day, and you may feel anxious or depressed. How can sleep apnea affect me? Sleep apnea increases your chances of extreme tiredness during the day (daytime fatigue). It can also increase your risk for health conditions, such as:  Heart attack.  Stroke.  Diabetes.  Heart failure.  Irregular heartbeat.  High blood pressure. If you have daytime fatigue as a result of sleep apnea, you may be more likely to:  Perform poorly at school or work.  Fall asleep while driving.  Have difficulty with attention.  Develop depression or anxiety.  Become severely overweight (obese).  Have sexual dysfunction. What actions can I take to manage sleep apnea? Sleep apnea treatment   If you were given a device to open your airway while you sleep, use it only as told by your health care provider. You may be given: ? An oral appliance. This is a custom-made mouthpiece that shifts your lower jaw forward. ? A continuous positive airway pressure (CPAP) device. This device blows air through a mask when you breathe out (exhale). ? A nasal expiratory positive airway pressure (EPAP) device. This device has valves that you put into each nostril. ? A bi-level positive airway pressure (BPAP) device. This device blows air through a mask when you breathe in (inhale) and breathe out (exhale).  You may need surgery if other treatments do not work for you. Sleep habits  Go to sleep and wake up at the same time every  day. This helps set your internal clock (circadian rhythm) for sleeping. ? If you stay up later than usual, such as on weekends, try to get up in the morning within 2 hours of your normal wake time.  Try to get at least 7-9 hours of sleep each night.  Stop computer, tablet, and mobile phone use a few hours before bedtime.  Do not take long naps during the day. If you nap, limit it to 30 minutes.  Have a relaxing bedtime routine. Reading or listening to music may relax you and help you sleep.  Use your bedroom only for sleep. ? Keep your television and computer out of your bedroom. ? Keep your bedroom cool, dark,  and quiet. ? Use a supportive mattress and pillows.  Follow your health care provider's instructions for other changes to sleep habits. Nutrition  Do not eat heavy meals in the evening.  Do not have caffeine in the later part of the day. The effects of caffeine can last for more than 5 hours.  Follow your health care provider's or dietitian's instructions for any diet changes. Lifestyle      Do not drink alcohol before bedtime. Alcohol can cause you to fall asleep at first, but then it can cause you to wake up in the middle of the night and have trouble getting back to sleep.  Do not use any products that contain nicotine or tobacco, such as cigarettes and e-cigarettes. If you need help quitting, ask your health care provider. Medicines  Take over-the-counter and prescription medicines only as told by your health care provider.  Do not use over-the-counter sleep medicine. You can become dependent on this medicine, and it can make sleep apnea worse.  Do not use medicines, such as sedatives and narcotics, unless told by your health care provider. Activity  Exercise on most days, but avoid exercising in the evening. Exercising near bedtime can interfere with sleeping.  If possible, spend time outside every day. Natural light helps regulate your circadian rhythm.  General information  Lose weight if you need to, and maintain a healthy weight.  Keep all follow-up visits as told by your health care provider. This is important.  If you are having surgery, make sure to tell your health care provider that you have sleep apnea. You may need to bring your device with you. Where to find more information Learn more about sleep apnea and daytime fatigue from:  American Sleep Association: sleepassociation.[place]: sleepfoundation.org  National Heart, Lung, and Blood Institute: https://www.[place].biz/ Summary  Sleep apnea can cause daytime fatigue and other serious health conditions.  Both sleep apnea and daytime fatigue can be bad for your health and well-being.  You may need to wear a device while sleeping to help keep your airway open.  If you are having surgery, make sure to tell your health care provider that you have sleep apnea. You may need to bring your device with you.  Making changes to sleep habits, diet, lifestyle, and activity can help you manage sleep apnea. This information is not intended to replace advice given to you by your health care provider. Make sure you discuss any questions you have with your health care provider. Document Released: 12/22/2017 Document Revised: 01/19/2019 Document Reviewed: 12/22/2017 Elsevier Patient Education  [place].    CPAP and BPAP Information CPAP and BPAP are methods of helping a person breathe with the use of air pressure. CPAP stands for "continuous positive airway pressure." BPAP stands for "bi-level positive airway pressure." In both methods, air is blown through your nose or mouth and into your air passages to help you breathe well. CPAP and BPAP use different amounts of pressure to blow air. With CPAP, the amount of pressure stays the same while you breathe in and out. With BPAP, the amount of pressure is increased when you breathe in (inhale) so that you can take larger  breaths. Your health care provider will recommend whether CPAP or BPAP would be more helpful for you. Why are CPAP and BPAP treatments used? CPAP or BPAP can be helpful if you have:  Sleep apnea.  Chronic obstructive pulmonary disease (COPD).  Heart failure.  Medical conditions that  weaken the muscles of the chest including muscular dystrophy, or neurological diseases such as amyotrophic lateral sclerosis (ALS).  Other problems that cause breathing to be weak, abnormal, or difficult. CPAP is most commonly used for obstructive sleep apnea (OSA) to keep the airways from collapsing when the muscles relax during sleep. How is CPAP or BPAP administered? Both CPAP and BPAP are provided by a small machine with a flexible plastic tube that attaches to a plastic mask. You wear the mask. Air is blown through the mask into your nose or mouth. The amount of pressure that is used to blow the air can be adjusted on the machine. Your health care provider will determine the pressure setting that should be used based on your individual needs. When should CPAP or BPAP be used? In most cases, the mask only needs to be worn during sleep. Generally, the mask needs to be worn throughout the night and during any daytime naps. People with certain medical conditions may also need to wear the mask at other times when they are awake. Follow instructions from your health care provider about when to use the machine. What are some tips for using the mask?   Because the mask needs to be snug, some people feel trapped or closed-in (claustrophobic) when first using the mask. If you feel this way, you may need to get used to the mask. One way to do this is by holding the mask loosely over your nose or mouth and then gradually applying the mask more snugly. You can also gradually increase the amount of time that you use the mask.  Masks are available in various types and sizes. Some fit over your mouth and nose while others fit  over just your nose. If your mask does not fit well, talk with your health care provider about getting a different one.  If you are using a mask that fits over your nose and you tend to breathe through your mouth, a chin strap may be applied to help keep your mouth closed.  The CPAP and BPAP machines have alarms that may sound if the mask comes off or develops a leak.  If you have trouble with the mask, it is very important that you talk with your health care provider about finding a way to make the mask easier to tolerate. Do not stop using the mask. Stopping the use of the mask could have a negative impact on your health. What are some tips for using the machine?  Place your CPAP or BPAP machine on a secure table or stand near an electrical outlet.  Know where the on/off switch is located on the machine.  Follow instructions from your health care provider about how to set the pressure on your machine and when you should use it.  Do not eat or drink while the CPAP or BPAP machine is on. Food or fluids could get pushed into your lungs by the pressure of the CPAP or BPAP.  Do not smoke. Tobacco smoke residue can damage the machine.  For home use, CPAP and BPAP machines can be rented or purchased through home health care companies. Many different brands of machines are available. Renting a machine before purchasing may help you find out which particular machine works well for you.  Keep the CPAP or BPAP machine and attachments clean. Ask your health care provider for specific instructions. Get help right away if:  You have redness or open areas around your nose or mouth where  the mask fits.  You have trouble using the CPAP or BPAP machine.  You cannot tolerate wearing the CPAP or BPAP mask.  You have pain, discomfort, and bloating in your abdomen. Summary  CPAP and BPAP are methods of helping a person breathe with the use of air pressure.  Both CPAP and BPAP are provided by a small  machine with a flexible plastic tube that attaches to a plastic mask.  If you have trouble with the mask, it is very important that you talk with your health care provider about finding a way to make the mask easier to tolerate. This information is not intended to replace advice given to you by your health care provider. Make sure you discuss any questions you have with your health care provider. Document Released: 06/25/2004 Document Revised: 01/17/2019 Document Reviewed: 08/16/2016 Elsevier Patient Education  2020 [employer].

## 2019-05-25 NOTE — Addendum Note (Signed)
Addended by: Valerie Salts on: 05/25/2019 11:10 AM   Modules accepted: Orders

## 2019-05-25 NOTE — Assessment & Plan Note (Signed)
Noncompliance with CPAP Patient interested in titration study Patient is not interested in oral appliance based off of 2018 attempt where mild OSA still persisted  Plan: CPAP titration Order for mask of choice Instructed patient to follow-up with DME company Follow-up in 2 months

## 2019-05-25 NOTE — Progress Notes (Signed)
_0  ID: Erica [name], female    DOB: 25-Feb-1956, 63 y.o.   MRN: [id number]  Chief Complaint  Patient presents with   Follow-up    CPAP f/u. Patient states she has not been using machine due to repeat sinus infections and the mask not being comfortable.     Referring provider: [name], MD  HPI:  63 year old female followed in our office for mild obstructive sleep apnea   PMH: Hyperlipidemia, hypertension, fatigue, diabetes, GERD Smoker/ Smoking History: Never smoker Maintenance: None Pt of: Dr. [name]  05/25/2019  - Visit   63 year old female never smoker followed in our office for mild obstructive sleep apnea.  Patient presenting today for follow-up visit.  Patient was tried briefly in 2018 with an oral appliance but obstructive sleep apnea persisted.  Patient CPAP compliance report today shows poor compliance.  See compliance report listed below:  04/24/2019-05/23/2019-CPAP compliance report-1 of the last 30 days use, 0 of those days greater than 4 hours, average usage 2 hours and 26 minutes, APAP setting 5-10, AHI 1.2  Patient reporting that she is been having recurrent sinus infections.  Chart review does not reveal that she has been having recurrent sinus infections.  Last treated with antibiotics and July/2020 via a tele-visit from her primary care provider.  Patient feels that CPAP use worsens her sinus flares.  Patient also reports a primary care tells her not to use her CPAP when she is having sinus infections.  Patient has never had formal sinus CT imaging.   Tests:   03/21/2017-home sleep study- AHI 12.3, SaO2 low 75%  10/21/2016-echocardiogram-LV ejection fraction 55 to 56%, grade 2 diastolic dysfunction  FENO:  No results found for: NITRICOXIDE  PFT: No flowsheet data found.  Imaging: Dg Chest 2 View  Result Date: 05/11/2019 CLINICAL DATA:  Palpitations EXAM: CHEST - 2 VIEW COMPARISON:  03/08/2017 FINDINGS: Normal heart size and mediastinal  contours. Artifact from EKG leads. There is no edema, consolidation, effusion, or pneumothorax. IMPRESSION: No active cardiopulmonary disease. Electronically Signed   By: Monte Fantasia M.D.   On: 05/11/2019 09:32   Dg Bone Density  Result Date: 05/18/2019 Date of study: 05/18/2019 Exam: DUAL X-RAY ABSORPTIOMETRY (DXA) FOR BONE MINERAL DENSITY (BMD) Instrument: [employer] Requesting Provider: PCP Indication: follow up for low BMD Comparison: none (please note that it is not possible to compare data from different instruments) Clinical data: Pt is a 63 y.o. female without previous history of fracture. Results:  Lumbar spine L1-L4 Femoral neck (FN) T-score -1.0 RFN: -1.1 LFN: -1.1 Assessment: By the [facility] Criteria for diagnosis based on bone density, this patient has Low Bone Density Z Score compares the patients bone density to age, sex, and race matched controls.  Compared to age, sex, and race matched controls, this patient's bone density is average FRAX 10-year fracture risk calculator: 6.6 % for any major fracture and 0.3 % for hip fracture.  Pharmacologic therapy is recommended if 10 year fracture risk is >20% for any major osteoporotic fracture or >3% for hip fracture.  Comments: the technical quality of the study is good. WHO criteria for diagnosis of osteoporosis in postmenopausal women and in men 27 y/o or older: - normal: T-score -1.0 to + 1.0 - osteopenia/low bone density: T-score between -2.5 and -1.0 - osteoporosis: T-score below -2.5 - severe osteoporosis: T-score below -2.5 with history of fragility fracture Note: although not part of the WHO classification, the presence of a fragility fracture, regardless of the T-score,  should be considered diagnostic of osteoporosis, provided other causes for the fracture have been excluded. RECOMMENDATIONS:  Recommend optimizing calcium (1200 mg/day) and vitamin D (800 IU/day) intake  Follow up BMD is recommended: 2 years INTERPRETED BY: Abby Nena Jordan, MD [facility]      Specialty Problems      Pulmonary Problems   Allergic rhinitis    Allegra, Flonase       Obstructive sleep apnea    AHI 8//h - 11/2009 AHI 13/h - 2014 HST 03/2017 >> (with oral appliance ) >> mild OSa persists - back on autoCPAP       Sinus pressure      Allergies  Allergen Reactions   Seroquel [Quetiapine Fumarate]     Hallucinations, palpitations   Codeine Nausea And Vomiting   Guaifenesin-Codeine Other (See Comments)    REACTION: feels spacey   Wellbutrin [Bupropion] Palpitations    hallucinations    Immunization History  Administered Date(s) Administered   Influenza,inj,Quad PF,6+ Mos 07/25/2013, 09/09/2014, 08/22/2015, 09/27/2016, 09/27/2017, 08/01/2018   Pneumococcal Conjugate-13 05/18/2019   Pneumococcal Polysaccharide-23 01/30/2009   Td 03/17/2004   Tdap 09/09/2014    Past Medical History:  Diagnosis Date   Abnormal stress test    a. 09/2016: NST showed a small defect of mild severity present in the basal inferoseptal and mid inferoseptal location, consistent with ischemia. --> medically managed   ALLERGIC RHINITIS    Anxiety    Bursitis    DDD (degenerative disc disease), lumbar    ESI with Ramos (spring 2016)   Depression    Diabetes mellitus without complication (HCC)    Dyslipidemia    External hemorrhoids    GERD (gastroesophageal reflux disease)    Hypertension    Obesity    Osteoarthritis    Palpitations    a. prior event monitor showing sinus tachycardia, no PAF.    Panic attacks    Hx of depression   Sleep apnea    CPAP hs    Tobacco History: Social History   Tobacco Use  Smoking Status Never Smoker  Smokeless Tobacco Never Used   Counseling given: Yes   Continue to not smoke  Outpatient Encounter Medications as of 05/25/2019  Medication Sig   albuterol (VENTOLIN HFA) 108 (90 Base) MCG/ACT inhaler Inhale 2 puffs into the lungs every 6 (six) hours as  needed for wheezing or shortness of breath.   ALPRAZolam (XANAX) 0.25 MG tablet Take 1 tablet (0.25 mg total) by mouth 3 (three) times daily as needed for anxiety. (Patient taking differently: Take 1 mg by mouth 2 (two) times daily as needed for anxiety. )   atenolol (TENORMIN) 25 MG tablet Take 84m (1 tablet) in the AM and 561m(2 tablets) in the PM   baclofen (LIORESAL) 10 MG tablet TAKE ONE TABLET BY MOUTH TWICE A DAY AS NEEDED FOR MUSCLE SPASMS   blood glucose meter kit and supplies KIT Dispense based on patient and insurance preference. Use up to four times daily as directed. (FOR ICD-9 250.00, 250.01).   ciclopirox (PENLAC) 8 % solution Apply topically at bedtime. Apply over nail & surrounding skin. Apply QD over previous coat.After7 days, may remove w alcohol, repeat   diclofenac (VOLTAREN) 75 MG EC tablet Take 1 tablet (75 mg total) by mouth 2 (two) times daily.   fluticasone (FLONASE) 50 MCG/ACT nasal spray Place 2 sprays into both nostrils daily.   furosemide (LASIX) 40 MG tablet Take 1 tablet (40 mg total) by mouth 2 (  two) times daily.   gabapentin (NEURONTIN) 100 MG capsule Take 1 capsule (100 mg total) by mouth 3 (three) times daily.   hydrocortisone (ANUSOL-HC) 2.5 % rectal cream Place rectally as needed.   ketoconazole (NIZORAL) 2 % cream Apply 1 application topically daily. Apply to both feet and between toes once daily for 6 weeks.   levocetirizine (XYZAL) 5 MG tablet Take 1 tablet (5 mg total) by mouth every evening.   linaclotide (LINZESS) 290 MCG CAPS capsule Take 1 capsule (290 mcg total) by mouth daily before breakfast.   meclizine (ANTIVERT) 25 MG tablet Take 1 tablet (25 mg total) by mouth 3 (three) times daily as needed for dizziness or nausea.   metFORMIN (GLUCOPHAGE) 500 MG tablet Take 1 tablet (500 mg total) by mouth daily with breakfast.   Multiple Vitamins-Minerals (MULTIVITAMIN WITH MINERALS) tablet Take 1 tablet by mouth daily.   NONFORMULARY OR  COMPOUNDED ITEM [facility]:  Antifungal - Terbinafine 3%, Fluconazole 2%, Tea Tree Oin 5%, Urea 10%, Ibuprofen 2% in DMSO #1m. Apply to the affected nail(s) once (at bedtime) or twice daily.   omeprazole (PRILOSEC) 20 MG capsule Take 1 capsule (20 mg total) by mouth daily.   ondansetron (ZOFRAN) 4 MG tablet Take 1 tablet (4 mg total) by mouth every 8 (eight) hours as needed for nausea or vomiting.   potassium chloride SA (K-DUR,KLOR-CON) 20 MEQ tablet Take 1 tablet (20 mEq total) by mouth 2 (two) times daily.   simvastatin (ZOCOR) 40 MG tablet Take 1 tablet (40 mg total) by mouth daily at 6 PM.   SUMAtriptan-naproxen (TREXIMET) 85-500 MG tablet Take 1 tablet by mouth every 2 (two) hours as needed for migraine.   Vitamin D, Ergocalciferol, (DRISDOL) 1.25 MG (50000 UT) CAPS capsule Take 1 capsule (50,000 Units total) by mouth every 7 (seven) days.   promethazine (PHENERGAN) 25 MG tablet Take 1 tablet (25 mg total) by mouth every 8 (eight) hours as needed for up to 7 days for nausea or vomiting (related to dizziness).   [DISCONTINUED] oxycodone (OXY-IR) 5 MG capsule Take 5 mg by mouth every 4 (four) hours as needed. For pain.   No facility-administered encounter medications on file as of 05/25/2019.      Review of Systems  Review of Systems  Constitutional: Negative for activity change, fatigue and fever.  HENT: Positive for congestion and postnasal drip. Negative for sinus pressure, sinus pain and sore throat.        Recently treated with antibiotics in July/2020 by a tele-visit very care provider for suspected sinus infection  Respiratory: Negative for cough, shortness of breath and wheezing.   Cardiovascular: Negative for chest pain and palpitations.  Gastrointestinal: Negative for diarrhea, nausea and vomiting.  Musculoskeletal: Negative for arthralgias.  Neurological: Negative for dizziness.  Psychiatric/Behavioral: Positive for sleep disturbance (Poor sleep quality). The  patient is not nervous/anxious.      Physical Exam  BP 114/74    Pulse 68    Temp 98.8 F (37.1 C) (Oral)    Ht _0  (1.702 m)    Wt 247 lb 6.4 oz (112.2 kg)    SpO2 96%    BMI 38.75 kg/m   Wt Readings from Last 5 Encounters:  05/25/19 247 lb 6.4 oz (112.2 kg)  05/18/19 242 lb (109.8 kg)  05/11/19 240 lb (108.9 kg)  10/19/18 238 lb 12.8 oz (108.3 kg)  08/28/18 231 lb (104.8 kg)     Physical Exam Vitals signs and nursing note reviewed.  Constitutional:      General: She is not in acute distress.    Appearance: Normal appearance. She is obese.  HENT:     Head: Normocephalic and atraumatic.     Right Ear: Tympanic membrane, ear canal and external ear normal. There is no impacted cerumen.     Left Ear: Tympanic membrane, ear canal and external ear normal. There is no impacted cerumen.     Nose: Rhinorrhea present. No congestion.     Mouth/Throat:     Mouth: Mucous membranes are moist.     Pharynx: Oropharynx is clear.     Comments: Postnasal drip Mallampati 3 Eyes:     Pupils: Pupils are equal, round, and reactive to light.  Neck:     Musculoskeletal: Normal range of motion.  Cardiovascular:     Rate and Rhythm: Normal rate and regular rhythm.     Pulses: Normal pulses.     Heart sounds: Normal heart sounds. No murmur.  Pulmonary:     Effort: Pulmonary effort is normal. No respiratory distress.     Breath sounds: Normal breath sounds. No decreased air movement. No decreased breath sounds, wheezing or rales.  Skin:    General: Skin is warm and dry.     Capillary Refill: Capillary refill takes less than 2 seconds.  Neurological:     General: No focal deficit present.     Mental Status: She is [name] and oriented to person, place, and time. Mental status is at baseline.     Gait: Gait normal.  Psychiatric:        Mood and Affect: Mood normal.        Behavior: Behavior normal.        Thought Content: Thought content normal.        Judgment: Judgment normal.       Lab Results:  CBC    Component Value Date/Time   WBC 7.2 05/11/2019 0919   RBC 4.71 05/11/2019 0919   HGB 13.0 05/11/2019 0919   HGB 12.4 02/09/2018 1053   HCT 40.8 05/11/2019 0919   HCT 38.6 02/09/2018 1053   PLT 272 05/11/2019 0919   MCV 86.6 05/11/2019 0919   MCV 87 02/09/2018 1053   MCH 27.6 05/11/2019 0919   MCHC 31.9 05/11/2019 0919   RDW 13.9 05/11/2019 0919   RDW 15.4 02/09/2018 1053   LYMPHSABS 1.4 05/11/2019 0919   LYMPHSABS 1.5 02/09/2018 1053   MONOABS 0.5 05/11/2019 0919   EOSABS 0.0 05/11/2019 0919   EOSABS 0.1 02/09/2018 1053   BASOSABS 0.0 05/11/2019 0919   BASOSABS 0.0 02/09/2018 1053    BMET    Component Value Date/Time   NA 138 05/11/2019 0919   NA 141 07/05/2018 1200   K 3.7 05/11/2019 0919   CL 101 05/11/2019 0919   CO2 23 05/11/2019 0919   GLUCOSE 134 (H) 05/11/2019 0919   GLUCOSE 89 09/20/2006 0915   BUN 14 05/11/2019 0919   BUN 16 07/05/2018 1200   CREATININE 0.72 05/11/2019 0919   CALCIUM 9.7 05/11/2019 0919   GFRNONAA >60 05/11/2019 0919   GFRAA >60 05/11/2019 0919    BNP No results found for: BNP  ProBNP    Component Value Date/Time   PROBNP 27.0 12/31/2014 1213      Assessment & Plan:   Obstructive sleep apnea Noncompliance with CPAP Patient interested in titration study Patient is not interested in oral appliance based off of 2018 attempt where mild OSA still persisted  Plan: CPAP titration  Order for mask of choice Instructed patient to follow-up with DME company Follow-up in 2 months  Allergic rhinitis Plan: Continue Allegra daily Continue Flonase Start nasal saline rinses    Return in about 2 months (around 07/25/2019), or if symptoms worsen or fail to improve, for Follow up with Dr. [name].   Lauraine Rinne, NP 05/25/2019   This appointment was 28 minutes long with over 50% of the time in direct face-to-face patient care, assessment, plan of care, and follow-up.

## 2019-05-25 NOTE — Assessment & Plan Note (Signed)
Plan: Continue Allegra daily Continue Flonase Start nasal saline rinses

## 2019-05-29 ENCOUNTER — Telehealth: Payer: Self-pay | Admitting: Pulmonary Disease

## 2019-05-29 ENCOUNTER — Encounter: Payer: Self-pay | Admitting: Internal Medicine

## 2019-05-29 NOTE — Telephone Encounter (Signed)
lmtcb for pt. Not sure why pt is calling back.

## 2019-05-29 NOTE — Telephone Encounter (Signed)
Pcc's didn't call the patient

## 2019-05-29 NOTE — Progress Notes (Signed)
Abstracted and sent to scan  

## 2019-05-30 NOTE — Telephone Encounter (Signed)
ATC patient LM to call back, x2

## 2019-05-31 NOTE — Telephone Encounter (Signed)
ATC patient unable to reach , left message x3 let patient know we won't be follow up and if she needed [place] to give our office a call again. Closing message per triage protocol

## 2019-06-03 NOTE — Progress Notes (Signed)
Cardiology Office Note   Date:  06/04/2019   ID:  Erica, [name] 1956/09/13, MRN [id number]  PCP:  [name], MD  Cardiologist:   No primary care provider on file.  Chief Complaint  Patient presents with   Palpitations     History of Present Illness:  Erica [name] is a 63 y.o. female who presents for follow up of palpitations.  She was in the emergency room on 7/31 for this.  I reviewed these records.  It turns out there was a medication error.  She had run out of Xanax and was actually taking Seroquel.  Her EKG was unremarkable.  High-sensitivity troponin was normal.  There was not thought to be any cardiac etiology.  She has been taking atenolol for years for palpitations and that seems to be working.  Sometimes she will wake up at night with rapid heart rates but this is been a stable pattern.  She has had no new symptoms since her stress perfusion study. I saw her in 09/2016 for evaluation of an abnormal stress test which showed a small defect of mild severity present in the basal inferoseptal and mid inferoseptal location, consistent with ischemia. She was treated medically.  An echocardiogram was obtained secondary to poor R-wave progression on her EKG and this showed a normal EF of 55-60% with Grade 2 DD and no wall motion abnormalities.   She has not been as active as I would like but she denies any cardiovascular symptoms with walking which she does occasionally.    Past Medical History:  Diagnosis Date   Abnormal stress test    a. 09/2016: NST showed a small defect of mild severity present in the basal inferoseptal and mid inferoseptal location, consistent with ischemia. --> medically managed   ALLERGIC RHINITIS    Anxiety    Bursitis    DDD (degenerative disc disease), lumbar    ESI with Ramos (spring 2016)   Depression    Diabetes mellitus without complication (HCC)    Dyslipidemia    External hemorrhoids    GERD (gastroesophageal reflux  disease)    Hypertension    Obesity    Osteoarthritis    Palpitations    a. prior event monitor showing sinus tachycardia, no PAF.    Panic attacks    Hx of depression   Sleep apnea    CPAP hs    Past Surgical History:  Procedure Laterality Date   ABDOMINAL HYSTERECTOMY     MASS EXCISION Left 12/30/2015   Procedure: EXCISION LEFT LEG MASS;  Surgeon: Donnie Mesa, MD;  Location: [place];  Service: General;  Laterality: Left;   REPLACEMENT TOTAL KNEE Right    RIGHT OOPHORECTOMY     No cancer     Current Outpatient Medications  Medication Sig Dispense Refill   albuterol (VENTOLIN HFA) 108 (90 Base) MCG/ACT inhaler Inhale 2 puffs into the lungs every 6 (six) hours as needed for wheezing or shortness of breath. 3 Inhaler 3   ALPRAZolam (XANAX) 0.25 MG tablet Take 1 tablet (0.25 mg total) by mouth 3 (three) times daily as needed for anxiety. (Patient taking differently: Take 1 mg by mouth 2 (two) times daily as needed for anxiety. ) 12 tablet 0   atenolol (TENORMIN) 25 MG tablet Take 77m (1 tablet) in the AM and 544m(2 tablets) in the PM 180 tablet 0   baclofen (LIORESAL) 10 MG tablet TAKE ONE TABLET BY MOUTH TWICE  A DAY AS NEEDED FOR MUSCLE SPASMS 30 each 0   blood glucose meter kit and supplies KIT Dispense based on patient and insurance preference. Use up to four times daily as directed. (FOR ICD-9 250.00, 250.01). 1 each 0   ciclopirox (PENLAC) 8 % solution Apply topically at bedtime. Apply over nail & surrounding skin. Apply QD over previous coat.After7 days, may remove w alcohol, repeat 6.6 mL 3   diclofenac (VOLTAREN) 75 MG EC tablet Take 1 tablet (75 mg total) by mouth 2 (two) times daily. 60 tablet 1   fluticasone (FLONASE) 50 MCG/ACT nasal spray Place 2 sprays into both nostrils daily. 48 g 11   furosemide (LASIX) 40 MG tablet Take 1 tablet (40 mg total) by mouth 2 (two) times daily. 120 tablet 0   gabapentin (NEURONTIN) 100 MG capsule  Take 1 capsule (100 mg total) by mouth 3 (three) times daily. 90 capsule 1   hydrocortisone (ANUSOL-HC) 2.5 % rectal cream Place rectally as needed. 30 g 3   ketoconazole (NIZORAL) 2 % cream Apply 1 application topically daily. Apply to both feet and between toes once daily for 6 weeks. 30 g 1   levocetirizine (XYZAL) 5 MG tablet Take 1 tablet (5 mg total) by mouth every evening. 90 tablet 1   linaclotide (LINZESS) 290 MCG CAPS capsule Take 1 capsule (290 mcg total) by mouth daily before breakfast. 90 capsule 1   meclizine (ANTIVERT) 25 MG tablet Take 1 tablet (25 mg total) by mouth 3 (three) times daily as needed for dizziness or nausea. 180 tablet 3   metFORMIN (GLUCOPHAGE) 500 MG tablet Take 1 tablet (500 mg total) by mouth daily with breakfast. 90 tablet 1   Multiple Vitamins-Minerals (MULTIVITAMIN WITH MINERALS) tablet Take 1 tablet by mouth daily.     NONFORMULARY OR COMPOUNDED ITEM [facility]:  Antifungal - Terbinafine 3%, Fluconazole 2%, Tea Tree Oin 5%, Urea 10%, Ibuprofen 2% in DMSO #59m. Apply to the affected nail(s) once (at bedtime) or twice daily. 30 each 5   omeprazole (PRILOSEC) 20 MG capsule Take 1 capsule (20 mg total) by mouth daily. 90 capsule 1   ondansetron (ZOFRAN) 4 MG tablet Take 1 tablet (4 mg total) by mouth every 8 (eight) hours as needed for nausea or vomiting. 40 tablet 0   potassium chloride SA (K-DUR,KLOR-CON) 20 MEQ tablet Take 1 tablet (20 mEq total) by mouth 2 (two) times daily. 180 tablet 1   simvastatin (ZOCOR) 40 MG tablet Take 1 tablet (40 mg total) by mouth daily at 6 PM. 60 tablet 0   SUMAtriptan-naproxen (TREXIMET) 85-500 MG tablet Take 1 tablet by mouth every 2 (two) hours as needed for migraine. 10 tablet 8   venlafaxine XR (EFFEXOR-XR) 150 MG 24 hr capsule Take 150 mg by mouth 2 (two) times daily.     Vitamin D, Ergocalciferol, (DRISDOL) 1.25 MG (50000 UT) CAPS capsule Take 1 capsule (50,000 Units total) by mouth every 7 (seven)  days. 4 capsule 0   promethazine (PHENERGAN) 25 MG tablet Take 1 tablet (25 mg total) by mouth every 8 (eight) hours as needed for up to 7 days for nausea or vomiting (related to dizziness). 30 tablet 2   No current facility-administered medications for this visit.     Allergies:   Seroquel [quetiapine fumarate], Codeine, Guaifenesin-codeine, and Wellbutrin [bupropion]    ROS:  Please see the history of present illness.   Otherwise, review of systems are positive for back problems she also says she has  newly diagnosed diabetes.   All other systems are reviewed and negative.    PHYSICAL EXAM: VS:  BP 108/80    Pulse 71    Ht 5' 7" (1.702 m)    Wt 251 lb (113.9 kg)    BMI 39.31 kg/m  , BMI Body mass index is 39.31 kg/m. GENERAL:  Well appearing HEENT:  Pupils equal round and reactive, fundi not visualized, oral mucosa unremarkable NECK:  No jugular venous distention, waveform within normal limits, carotid upstroke brisk and symmetric, no bruits, no thyromegaly LYMPHATICS:  No cervical, inguinal adenopathy LUNGS:  Clear to auscultation bilaterally BACK:  No CVA tenderness CHEST:  Unremarkable HEART:  PMI not displaced or sustained,S1 and S2 within normal limits, no S3, no S4, no clicks, no rubs, no murmurs ABD:  Flat, positive bowel sounds normal in frequency in pitch, no bruits, no rebound, no guarding, no midline pulsatile mass, no hepatomegaly, no splenomegaly EXT:  2 plus pulses throughout, no edema, no cyanosis no clubbing SKIN:  No rashes no nodules NEURO:  Cranial nerves II through XII grossly intact, motor grossly intact throughout PSYCH:  Cognitively intact, oriented to person place and time    EKG:  EKG is not ordered today. The ekg ordered to 7/31 day demonstrates normal sinus rhythm, rate 73, axis within normal limits, intervals within normal limits, no acute ST-T wave changes, poor anterior R wave progression.   Recent Labs: 05/11/2019: ALT 12; BUN 14; Creatinine, Ser  0.72; Hemoglobin 13.0; Platelets 272; Potassium 3.7; Sodium 138; TSH 2.717    Lipid Panel    Component Value Date/Time   CHOL 183 02/09/2018 1053   TRIG 99 02/09/2018 1053   TRIG 51 09/20/2006 0915   HDL 55 02/09/2018 1053   CHOLHDL 4 09/27/2017 1204   VLDL 26.4 09/27/2017 1204   LDLCALC 108 (H) 02/09/2018 1053      Wt Readings from Last 3 Encounters:  06/04/19 251 lb (113.9 kg)  05/25/19 247 lb 6.4 oz (112.2 kg)  05/18/19 242 lb (109.8 kg)      Other studies Reviewed: Additional studies/ records that were reviewed today include: ED records. Review of the above records demonstrates:  Please see elsewhere in the note.     ASSESSMENT AND PLAN:   Palpitations These are baseline.  She seems to be well treated with her current course of medications.  No change in therapy.  Abnormal Stress Test She had no new symptoms since this mildly abnormal stress test.  Were managing this medically.  She can continue with risk reduction.  HTN Her blood pressure is well controlled.  She will continue the meds as listed.  HLD LDL was 108 but HDL was 55.  Given this I think she is on a reasonable regimen.   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  She is overdue for follow up.  I will order this.  WEIGHT I talked about weight loss and diet strategies as she is gained 20 pounds in the last few months.  Current medicines are reviewed at length with the patient today.  The patient does not have concerns regarding medicines.  The following changes have been made:  no change  Labs/ tests ordered today include:   Orders Placed This Encounter  Procedures   VAS US CAROTID     Disposition:   FU with me as needed.     Signed, Minus Breeding, MD  06/04/2019 1:58 PM    [facility]

## 2019-06-04 ENCOUNTER — Encounter: Payer: Self-pay | Admitting: Cardiology

## 2019-06-04 ENCOUNTER — Ambulatory Visit (INDEPENDENT_AMBULATORY_CARE_PROVIDER_SITE_OTHER): Payer: Medicare Other | Admitting: Cardiology

## 2019-06-04 ENCOUNTER — Other Ambulatory Visit: Payer: Self-pay

## 2019-06-04 VITALS — BP 108/80 | HR 71 | Ht 67.0 in | Wt 251.0 lb

## 2019-06-04 DIAGNOSIS — E663 Overweight: Secondary | ICD-10-CM

## 2019-06-04 DIAGNOSIS — I10 Essential (primary) hypertension: Secondary | ICD-10-CM | POA: Diagnosis not present

## 2019-06-04 DIAGNOSIS — R002 Palpitations: Secondary | ICD-10-CM | POA: Diagnosis not present

## 2019-06-04 DIAGNOSIS — I6523 Occlusion and stenosis of bilateral carotid arteries: Secondary | ICD-10-CM | POA: Insufficient documentation

## 2019-06-04 DIAGNOSIS — E785 Hyperlipidemia, unspecified: Secondary | ICD-10-CM

## 2019-06-04 NOTE — Patient Instructions (Signed)
Medication Instructions:  Your physician recommends that you continue on your current medications as directed. Please refer to the Current Medication list given to you today.  If you need a refill on your cardiac medications before your next appointment, please call your pharmacy.   Lab work: NONE  Testing/Procedures: Your physician has requested that you have a carotid duplex. This test is an ultrasound of the carotid arteries in your neck. It looks at blood flow through these arteries that supply the brain with blood. Allow one hour for this exam. There are no restrictions or special instructions.  Follow-Up: AS NEEDED

## 2019-06-05 ENCOUNTER — Other Ambulatory Visit (HOSPITAL_COMMUNITY)
Admission: RE | Admit: 2019-06-05 | Discharge: 2019-06-05 | Disposition: A | Discharge status: Home / Self Care | Payer: Medicare Other | Source: Ambulatory Visit | Attending: Pulmonary Disease | Admitting: Pulmonary Disease

## 2019-06-05 DIAGNOSIS — Z20828 Contact with and (suspected) exposure to other viral communicable diseases: Secondary | ICD-10-CM | POA: Insufficient documentation

## 2019-06-05 DIAGNOSIS — Z01812 Encounter for preprocedural laboratory examination: Secondary | ICD-10-CM | POA: Diagnosis not present

## 2019-06-05 LAB — SARS CORONAVIRUS 2 (TAT 6-24 HRS): SARS Coronavirus 2: NEGATIVE

## 2019-06-07 ENCOUNTER — Other Ambulatory Visit: Payer: Self-pay

## 2019-06-07 ENCOUNTER — Ambulatory Visit (HOSPITAL_BASED_OUTPATIENT_CLINIC_OR_DEPARTMENT_OTHER): Payer: Medicare Other | Attending: Pulmonary Disease | Admitting: Pulmonary Disease

## 2019-06-07 DIAGNOSIS — G4733 Obstructive sleep apnea (adult) (pediatric): Secondary | ICD-10-CM | POA: Insufficient documentation

## 2019-06-08 ENCOUNTER — Other Ambulatory Visit: Payer: Self-pay

## 2019-06-08 DIAGNOSIS — G4733 Obstructive sleep apnea (adult) (pediatric): Secondary | ICD-10-CM

## 2019-06-08 NOTE — Progress Notes (Signed)
CPAP titration report has come back:  Recommendations are to change patient's CPAP pressure to 13  See recommendations listed below:   RECOMMENDATIONS  - Trial of CPAP therapy on 13 cm H2O with a Medium size Resmed  Full Face Mask AirFit F30i mask and heated humidification.  - Avoid alcohol, sedatives and other CNS depressants that may  worsen sleep apnea and disrupt normal sleep architecture.  - Sleep hygiene should be reviewed to assess factors that may  improve sleep quality.  - Weight management and regular exercise should be initiated or  continued.    Please place order with DME company for set pressure changes.  If patient would like to continue using the same mask that she has that is okay for now.  If she is would like to try the mask that is suggested based on the CPAP titration that we can place an order for it.  Wyn Quaker FNP

## 2019-06-08 NOTE — Addendum Note (Signed)
Addended by: Valerie Salts on: 06/08/2019 04:09 PM   Modules accepted: Orders

## 2019-06-08 NOTE — Procedures (Signed)
Patient Name: Erica [name], Erica [name] Date: 06/07/2019 Gender: Female D.O.B: 1956-04-24 Age (years): 23 Referring Provider: Lauraine Rinne NP Height (inches): 67 Interpreting Physician: Kara Mead MD, ABSM Weight (lbs): 245 RPSGT: Zadie Rhine BMI: 38 MRN: [id number] Neck Size: 14.00 <br> <br> CLINICAL INFORMATION The patient is referred for a CPAP titration to treat sleep apnea due to poor compliance / intolerance of CPAP    Date of HST: 03/21/2017-home sleep study- AHI 12.3, SaO2 low 75%  SLEEP STUDY TECHNIQUE As per the AASM Manual for the Scoring of Sleep and Associated Events v2.3 (April 2016) with a hypopnea requiring 4% desaturations.  The channels recorded and monitored were frontal, central and occipital EEG, electrooculogram (EOG), submentalis EMG (chin), nasal and oral airflow, thoracic and abdominal wall motion, anterior tibialis EMG, snore microphone, electrocardiogram, and pulse oximetry. Continuous positive airway pressure (CPAP) was initiated at the beginning of the study and titrated to treat sleep-disordered breathing.  MEDICATIONS Medications self-administered by patient taken the night of the study : EFFEXOR-XR, NEURONTIN, PRILCOSEC, Duanne Moron, XYZAL  TECHNICIAN COMMENTS Comments added by technician: NO RESTROOM VISTED. Patient had difficulty initiating sleep. Comments added by scorer: N/A RESPIRATORY PARAMETERS Optimal PAP Pressure (cm): 13 AHI at Optimal Pressure (/hr): 0.0 Overall Minimal O2 (%): 91.0 Supine % at Optimal Pressure (%): 0 Minimal O2 at Optimal Pressure (%): 94.0   SLEEP ARCHITECTURE The study was initiated at 10:17:11 PM and ended at 4:36:34 AM.  Sleep onset time was 22.5 minutes and the sleep efficiency was 88.7%%. The total sleep time was 336.4 minutes.  The patient spent 8.3%% of the night in stage N1 sleep, 81.9%% in stage N2 sleep, 0.0%% in stage N3 and 9.8% in REM.Stage REM latency was 85.5 minutes  Wake after sleep onset was 20.5. Alpha  intrusion was absent. Supine sleep was 36.12%.  CARDIAC DATA The 2 lead EKG demonstrated sinus rhythm. The mean heart rate was 57.1 beats per minute. Other EKG findings include: None.   LEG MOVEMENT DATA The total Periodic Limb Movements of Sleep (PLMS) were 0. The PLMS index was 0.0. A PLMS index of <15 is considered normal in adults.  IMPRESSIONS - The optimal PAP pressure was 13 cm of water. - Central sleep apnea was not noted during this titration (CAI = 0.0/h). - Significant oxygen desaturations were not observed during this titration (min O2 = 91.0%). - No snoring was audible during this study. - No cardiac abnormalities were observed during this study. - Clinically significant periodic limb movements were not noted during this study. Arousals associated with PLMs were rare.   DIAGNOSIS - Obstructive Sleep Apnea (327.23 [G47.33 ICD-10])   RECOMMENDATIONS - Trial of CPAP therapy on 13 cm H2O with a Medium size Resmed Full Face Mask AirFit F30i mask and heated humidification. - Avoid alcohol, sedatives and other CNS depressants that may worsen sleep apnea and disrupt normal sleep architecture. - Sleep hygiene should be reviewed to assess factors that may improve sleep quality. - Weight management and regular exercise should be initiated or continued. - Return to [facility] for re-evaluation after 4 weeks of therapy   Kara Mead MD Board Certified in [place]

## 2019-06-12 ENCOUNTER — Ambulatory Visit (HOSPITAL_COMMUNITY)
Admission: RE | Admit: 2019-06-12 | Discharge: 2019-06-12 | Disposition: A | Discharge status: Home / Self Care | Payer: Medicare Other | Source: Ambulatory Visit | Attending: Internal Medicine | Admitting: Internal Medicine

## 2019-06-12 ENCOUNTER — Other Ambulatory Visit: Payer: Self-pay

## 2019-06-12 ENCOUNTER — Other Ambulatory Visit (HOSPITAL_COMMUNITY): Payer: Self-pay | Admitting: Cardiology

## 2019-06-12 ENCOUNTER — Other Ambulatory Visit: Payer: Self-pay | Admitting: Cardiology

## 2019-06-12 DIAGNOSIS — I773 Arterial fibromuscular dysplasia: Secondary | ICD-10-CM | POA: Insufficient documentation

## 2019-06-27 DIAGNOSIS — F410 Panic disorder [episodic paroxysmal anxiety] without agoraphobia: Secondary | ICD-10-CM | POA: Diagnosis not present

## 2019-06-27 DIAGNOSIS — F411 Generalized anxiety disorder: Secondary | ICD-10-CM | POA: Diagnosis not present

## 2019-06-27 DIAGNOSIS — F332 Major depressive disorder, recurrent severe without psychotic features: Secondary | ICD-10-CM | POA: Diagnosis not present

## 2019-07-14 ENCOUNTER — Ambulatory Visit (INDEPENDENT_AMBULATORY_CARE_PROVIDER_SITE_OTHER): Payer: Medicare Other

## 2019-07-14 DIAGNOSIS — Z23 Encounter for immunization: Secondary | ICD-10-CM

## 2019-07-19 NOTE — Progress Notes (Signed)
Virtual Visit via Video Note  I connected with Erica [name] on 07/20/19 at 10:30 AM EDT by a video enabled telemedicine application and verified that I am speaking with the correct person using two identifiers.   I discussed the limitations of evaluation and management by telemedicine and the availability of in person appointments. The patient expressed understanding and agreed to proceed.  The patient is currently at home and I am in the office.    No referring provider.    History of Present Illness: She is here for an acute visit for cold symptoms.   She had her flu shot one week ago.  Her symptoms started three days ago.  She had chills and felt achy.  The achiness and chills went away.  She has had nasal congestion, cough from postnasal drip and a sore throat.  The sore throat has resolved.  She has had some ear pain and feels it is related to her sinuses.  She did initially have some sinus pain in her forehead, but that has resolved.  She does feel better than she did.  In the past this was consistent with a sinus infection and antibiotics have cleared up.    She has tried taking prescription cough syrup, Xyzal, tylenol  Review of Systems  Constitutional: Positive for chills. Negative for fever and malaise/fatigue.  HENT: Positive for congestion and ear pain (both ears). Negative for sinus pain and sore throat (had ST - resolved).        PND.  No loss of taste or smell.  Respiratory: Positive for cough (from PND). Negative for sputum production and wheezing.   Gastrointestinal: Negative for diarrhea and nausea.  Musculoskeletal: Positive for myalgias.  Neurological: Negative for dizziness and headaches.     Social History   Socioeconomic History  . Marital status: Married    Spouse name: [name]  . Number of children: 3  . Years of education: Not on file  . Highest education level: Not on file  Occupational History  . Occupation: Disabled  Social Needs  . Financial  resource strain: Not on file  . Food insecurity    Worry: Not on file    Inability: Not on file  . Transportation needs    Medical: Not on file    Non-medical: Not on file  Tobacco Use  . Smoking status: Never Smoker  . Smokeless tobacco: Never Used  Substance and Sexual Activity  . Alcohol use: Yes    Alcohol/week: 1.0 standard drinks    Types: 1 Glasses of wine per week    Comment: one drink a month  . Drug use: No  . Sexual activity: Not Currently  Lifestyle  . Physical activity    Days per week: Not on file    Minutes per session: Not on file  . Stress: Not on file  Relationships  . Social Herbalist on phone: Not on file    Gets together: Not on file    Attends religious service: Not on file    Active member of club or organization: Not on file    Attends meetings of clubs or organizations: Not on file    Relationship status: Not on file  Other Topics Concern  . Not on file  Social History Narrative   Married with children. Pt is on disablity. Previous worked in the school system     Observations/Objective: Appears well in NAD   Assessment and Plan:  See Problem List for  Assessment and Plan of chronic medical problems.   Follow Up Instructions:    I discussed the assessment and treatment plan with the patient. The patient was provided an opportunity to ask questions and all were answered. The patient agreed with the plan and demonstrated an understanding of the instructions.   The patient was advised to call back or seek an in-person evaluation if the symptoms worsen or if the condition fails to improve as anticipated.    [name], MD

## 2019-07-20 ENCOUNTER — Ambulatory Visit (INDEPENDENT_AMBULATORY_CARE_PROVIDER_SITE_OTHER): Payer: Medicare Other | Admitting: Internal Medicine

## 2019-07-20 ENCOUNTER — Encounter: Payer: Self-pay | Admitting: Internal Medicine

## 2019-07-20 DIAGNOSIS — J069 Acute upper respiratory infection, unspecified: Secondary | ICD-10-CM

## 2019-07-20 DIAGNOSIS — I6523 Occlusion and stenosis of bilateral carotid arteries: Secondary | ICD-10-CM

## 2019-07-20 MED ORDER — HYDROXYZINE HCL 10 MG PO TABS: 10.0000 mg | ORAL_TABLET | Freq: Three times a day (TID) | ORAL | 0 refills | Status: DC | PRN

## 2019-07-20 NOTE — Assessment & Plan Note (Signed)
Symptoms likely viral in nature Continue symptomatic treatment with over-the-counter cold medications, Tylenol/ibuprofen, cough syrup Increase rest and fluids Call if symptoms worsen or do not improve

## 2019-07-27 ENCOUNTER — Encounter: Payer: Self-pay | Admitting: Internal Medicine

## 2019-08-08 ENCOUNTER — Encounter: Payer: Self-pay | Admitting: Pulmonary Disease

## 2019-08-08 ENCOUNTER — Telehealth: Payer: Self-pay

## 2019-08-08 ENCOUNTER — Ambulatory Visit (INDEPENDENT_AMBULATORY_CARE_PROVIDER_SITE_OTHER): Payer: Medicare Other | Admitting: Pulmonary Disease

## 2019-08-08 ENCOUNTER — Other Ambulatory Visit: Payer: Self-pay

## 2019-08-08 VITALS — BP 132/84 | HR 84 | Temp 97.0°F | Ht 67.0 in | Wt 250.0 lb

## 2019-08-08 DIAGNOSIS — Z6837 Body mass index (BMI) 37.0-37.9, adult: Secondary | ICD-10-CM | POA: Diagnosis not present

## 2019-08-08 DIAGNOSIS — J301 Allergic rhinitis due to pollen: Secondary | ICD-10-CM | POA: Diagnosis not present

## 2019-08-08 DIAGNOSIS — I6523 Occlusion and stenosis of bilateral carotid arteries: Secondary | ICD-10-CM

## 2019-08-08 DIAGNOSIS — G4733 Obstructive sleep apnea (adult) (pediatric): Secondary | ICD-10-CM

## 2019-08-08 NOTE — Assessment & Plan Note (Signed)
Difficult problem here-poor CPAP compliance due to sinusitis which is longstanding.  Unfortunately she cannot tolerate decongestants, this was caused panic attacks in the past She has been getting the run around from DME about getting a new mask-she does have new full facemask but does not have been connected for her hose  Make appointment at sleep lab [phone] for mass desensitization  Prescription to DME for new hose  If above does not work, then go back to using oral appliance until he can get new supplies in February.  We discussed hypoglossal nerve stimulation Unfortunately, you do not qualify for the inspire device -need BMI to be less than 32

## 2019-08-08 NOTE — Patient Instructions (Signed)
Make appointment at sleep lab [phone] for mass desensitization  Prescription to DME for new hose  If above does not work, then go back to using oral appliance until he can get new supplies in February.  Unfortunately, you do not qualify for the inspire device -need BMI to be less than 32

## 2019-08-08 NOTE — Assessment & Plan Note (Signed)
Continue Flonase and Zyrtec. Does not tolerate decongestants

## 2019-08-08 NOTE — Progress Notes (Signed)
   Subjective:    Patient ID: Erica [name], female    DOB: Nov 16, 1955, 63 y.o.   MRN: [id number]  HPI  63 year old never smoker for follow-up of OSA   OSA diagnosed in 2011 was poorly compliant, try CPAP again and to 2017 and could not tolerate, obtained oral appliance but follow-up home study showed that OSA was not completely treated. Hence started back on autoCPAP in 04/2017 with nasal pillows  Chief Complaint  Patient presents with  . Follow-up    OSA-CPAP mask not working increasing sinus infections    Unfortunately she has not been able to use her CPAP much.  She attributes this to sinus congestion.  She is unable to use decongestants as these have caused panic issues in the past and hospitalization. She is on auto CPAP with nasal pillows with average pressure around 8 cm and good control of events but poor compliance. CPAP titration was reviewed which shows average pressure of 13 cm required. She was fitted with a full facemask which she has been unable to connect this to her hose.  DME is giving her the run around for supplies  She reports ongoing depression, has gained weight due to 50 pounds. In the past oral appliance has not worked-he will had residual OSA      Significant tests/ events reviewed    05/2019 CPAP titration >> 13 cm 11/2009  mild , AHI of 10/hr and desat as low as 83% - wt 240 lbs - BMI 39  PSG 09/2013 showed mild OSA- AHI 13/h    HST 01/2016 20/hr.     HST 03/2017 >> (with oral appliance ) >> mild OSa persists  CT head showed meningioma & has cluster headaches    Past Medical History:  Diagnosis Date  . Abnormal stress test    a. 09/2016: NST showed a small defect of mild severity present in the basal inferoseptal and mid inferoseptal location, consistent with ischemia. --> medically managed  . ALLERGIC RHINITIS   . Anxiety   . Bursitis   . DDD (degenerative disc disease), lumbar    ESI with Ramos (spring 2016)  . Depression   .  Diabetes mellitus without complication ([place])   . Dyslipidemia   . External hemorrhoids   . GERD (gastroesophageal reflux disease)   . Hypertension   . Obesity   . Osteoarthritis   . Palpitations    a. prior event monitor showing sinus tachycardia, no PAF.   Marland Kitchen Panic attacks    Hx of depression  . Sleep apnea    CPAP hs    Review of Systems neg for any significant sore throat, dysphagia, itching, sneezing, nasal congestion or excess/ purulent secretions, fever, chills, sweats, unintended wt loss, pleuritic or exertional cp, hempoptysis, orthopnea pnd or change in chronic leg swelling. Also denies presyncope, palpitations, heartburn, abdominal pain, nausea, vomiting, diarrhea or change in bowel or urinary habits, dysuria,hematuria, rash, arthralgias, visual complaints, headache, numbness weakness or ataxia.     Objective:   Physical Exam  Gen. Pleasant, obese, in no distress ENT - no lesions, no post nasal drip Neck: No JVD, no thyromegaly, no carotid bruits Lungs: no use of accessory muscles, no dullness to percussion, decreased without rales or rhonchi  Cardiovascular: Rhythm regular, heart sounds  normal, no murmurs or gallops, no peripheral edema Musculoskeletal: No deformities, no cyanosis or clubbing , no tremors       Assessment & Plan:

## 2019-08-08 NOTE — Telephone Encounter (Signed)
Pt aware and expressed understanding.  

## 2019-08-08 NOTE — Telephone Encounter (Signed)
Spoke with pt. She will get her shingles shot at the pharmacy. She also states that linzess does not help her much. She is still constipated and wanted to see if something else can be called in to the [place] mail order pharmacy.

## 2019-08-08 NOTE — Telephone Encounter (Signed)
She may need a combination of different medications.  These may be things she is already tried in the past, but should try them again.  She can try adding Metamucil daily, MiraLAX daily, stool softener such as Colace on a daily basis.  She should be drinking plenty of water, exercising regularly and eating a high-fiber diet.  If all of this is not working the next step would be for her to see GI for further evaluation.

## 2019-08-08 NOTE — Assessment & Plan Note (Signed)
Obesity and depression complicates management of OSA. Clearly weight loss would be beneficial in the long run. She is considering gastric sleeve surgery

## 2019-08-08 NOTE — Telephone Encounter (Signed)
Copied from [place] [phone]. Topic: General - Other >> Aug 07, 2019  1:11 PM Yvette Rack wrote: Reason for CRM: Pt stated she spoke with [facility] and she told to contact the office about getting the shingles vaccine. Pt also stated that the linaclotide (LINZESS) 290 MCG CAPS capsule is not working because she still is not having normal bowel movements. Pt requests call back.

## 2019-08-15 ENCOUNTER — Telehealth: Payer: Self-pay | Admitting: *Deleted

## 2019-08-15 DIAGNOSIS — M5432 Sciatica, left side: Secondary | ICD-10-CM

## 2019-08-15 NOTE — Telephone Encounter (Signed)
Pt left msg stating she needs a refill of gabapentin. She needs #30 sent into the [facility] & #60 sent into the [place]. Pt also stated that the "other med" Dr. Raeford Razor prescribed her is not helping.

## 2019-08-16 MED ORDER — GABAPENTIN 100 MG PO CAPS: 100.0000 mg | ORAL_CAPSULE | Freq: Three times a day (TID) | ORAL | 1 refills | Status: DC

## 2019-08-16 NOTE — Telephone Encounter (Signed)
Refilled gabapentin.   Rosemarie Ax, MD [facility] 08/16/2019, 8:13 AM

## 2019-08-20 ENCOUNTER — Other Ambulatory Visit (HOSPITAL_COMMUNITY)
Admission: RE | Admit: 2019-08-20 | Discharge: 2019-08-20 | Disposition: A | Discharge status: Home / Self Care | Payer: Medicare Other | Source: Ambulatory Visit | Attending: Pulmonary Disease | Admitting: Pulmonary Disease

## 2019-08-20 DIAGNOSIS — Z01812 Encounter for preprocedural laboratory examination: Secondary | ICD-10-CM | POA: Insufficient documentation

## 2019-08-20 DIAGNOSIS — Z20828 Contact with and (suspected) exposure to other viral communicable diseases: Secondary | ICD-10-CM | POA: Insufficient documentation

## 2019-08-20 LAB — SARS CORONAVIRUS 2 (TAT 6-24 HRS): SARS Coronavirus 2: NEGATIVE

## 2019-08-22 ENCOUNTER — Ambulatory Visit (HOSPITAL_BASED_OUTPATIENT_CLINIC_OR_DEPARTMENT_OTHER): Payer: Medicare Other | Attending: Pulmonary Disease | Admitting: Radiology

## 2019-08-22 ENCOUNTER — Other Ambulatory Visit: Payer: Self-pay

## 2019-08-22 ENCOUNTER — Ambulatory Visit
Admission: EM | Admit: 2019-08-22 | Discharge: 2019-08-22 | Disposition: A | Discharge status: Home / Self Care | Payer: Medicare Other | Attending: Emergency Medicine | Admitting: Emergency Medicine

## 2019-08-22 ENCOUNTER — Ambulatory Visit (INDEPENDENT_AMBULATORY_CARE_PROVIDER_SITE_OTHER): Payer: Medicare Other

## 2019-08-22 DIAGNOSIS — R0602 Shortness of breath: Secondary | ICD-10-CM | POA: Diagnosis not present

## 2019-08-22 DIAGNOSIS — R0789 Other chest pain: Secondary | ICD-10-CM

## 2019-08-22 DIAGNOSIS — R0600 Dyspnea, unspecified: Secondary | ICD-10-CM

## 2019-08-22 DIAGNOSIS — G4733 Obstructive sleep apnea (adult) (pediatric): Secondary | ICD-10-CM

## 2019-08-22 DIAGNOSIS — R0609 Other forms of dyspnea: Secondary | ICD-10-CM

## 2019-08-22 DIAGNOSIS — E119 Type 2 diabetes mellitus without complications: Secondary | ICD-10-CM

## 2019-08-22 LAB — POCT FASTING CBG KUC MANUAL ENTRY: POCT Glucose (KUC): 90 mg/dL (ref 70–99)

## 2019-08-22 NOTE — ED Provider Notes (Signed)
[facility]    CSN: [id number] Arrival date & time: 08/22/19  1312      History   Chief Complaint Chief Complaint  Patient presents with  . Shortness of Breath    HPI Erica [name] is a 63 y.o. female with history of obesity, hypertension, dyslipidemia, type 2 diabetes, sleep apnea presenting for increased dyspnea for the last week.  States is worse with exertion.  Denies other associated symptoms such as lightheadedness, chest pain, severe abdominal pain.  Patient does note taking Lasix daily: States she takes it once every morning as opposed to twice daily.  States she was prescribed Afrin right knee replacement, though is unsure why, "they never took me off ".  Patient denies history of heart failure.  No fever, cough, sick exposure.  Patient waiting on replacement piece for her CPAP.   Past Medical History:  Diagnosis Date  . Abnormal stress test    a. 09/2016: NST showed a small defect of mild severity present in the basal inferoseptal and mid inferoseptal location, consistent with ischemia. --> medically managed  . ALLERGIC RHINITIS   . Anxiety   . Bursitis   . DDD (degenerative disc disease), lumbar    ESI with [facility] (spring 2016)  . Depression   . Diabetes mellitus without complication ([place])   . Dyslipidemia   . External hemorrhoids   . GERD (gastroesophageal reflux disease)   . Hypertension   . Obesity   . Osteoarthritis   . Palpitations    a. prior event monitor showing sinus tachycardia, no PAF.   Marland Kitchen Panic attacks    Hx of depression  . Sleep apnea    CPAP hs    Patient Active Problem List   Diagnosis Date Noted  . Bilateral carotid artery stenosis 06/04/2019  . Osteopenia 05/19/2019  . Visual hallucinations 05/17/2019  . Sinus pressure 02/12/2019  . Acute pain of left knee 12/11/2018  . Class 2 severe obesity with serious comorbidity and body mass index (BMI) of 37.0 to 37.9 in adult ([place]) 08/31/2018  . Degeneration of lumbar  intervertebral disc 05/30/2018  . Onychomycosis 12/07/2017  . Nausea without vomiting 12/07/2017  . Left hip pain 06/12/2017  . Acute left-sided low back pain without sciatica 06/12/2017  . Meningioma ([place]) 03/28/2017  . Panic attacks 09/27/2016  . GERD (gastroesophageal reflux disease) 09/27/2016  . Grief reaction 08/23/2016  . Right ankle pain 08/19/2016  . Diabetes ([place]) 02/15/2016  . Lump of skin 11/28/2015  . Migraine without aura and with status migrainosus, not intractable 10/03/2015  . Numbness of left hand 10/03/2015  . Cephalalgia 10/03/2015  . Abnormal ECG 09/11/2012  . Palpitations 09/08/2012  . Sciatica of left side   . Peripheral edema 05/09/2012  . FATIGUE 02/23/2010  . Obstructive sleep apnea 10/31/2009  . External hemorrhoids 10/17/2009  . CONSTIPATION, CHRONIC 10/17/2009  . Hyperlipidemia 01/12/2008  . Depression, major, recurrent ([place]) 01/12/2008  . Essential hypertension, benign 01/12/2008  . Allergic rhinitis 01/12/2008    Past Surgical History:  Procedure Laterality Date  . ABDOMINAL HYSTERECTOMY    . MASS EXCISION Left 12/30/2015   Procedure: EXCISION LEFT LEG MASS;  Surgeon: Donnie Mesa, MD;  Location: [facility];  Service: General;  Laterality: Left;  . REPLACEMENT TOTAL KNEE Right   . RIGHT OOPHORECTOMY     No cancer    OB History    Gravida  3   Para  3   Term  3   Preterm  AB      Living  3     SAB      TAB      Ectopic      Multiple      Live Births               Home Medications    Prior to Admission medications   Medication Sig Start Date End Date Taking? Authorizing Provider  albuterol (VENTOLIN HFA) 108 (90 Base) MCG/ACT inhaler Inhale 2 puffs into the lungs every 6 (six) hours as needed for wheezing or shortness of breath. 02/12/19   [name], MD  ALPRAZolam Duanne Moron) 0.25 MG tablet Take 1 tablet (0.25 mg total) by mouth 3 (three) times daily as needed for anxiety. Patient taking  differently: Take 1 mg by mouth 3 (three) times daily as needed for anxiety.  05/11/19   Charlesetta Shanks, MD  atenolol (TENORMIN) 25 MG tablet Take '25mg'$  (1 tablet) in the AM and '50mg'$  (2 tablets) in the PM 05/09/19   Burns, Claudina Lick, MD  baclofen (LIORESAL) 10 MG tablet TAKE ONE TABLET BY MOUTH TWICE A DAY AS NEEDED FOR MUSCLE SPASMS 05/07/19   Rosemarie Ax, MD  blood glucose meter kit and supplies KIT Dispense based on patient and insurance preference. Use up to four times daily as directed. (FOR ICD-9 250.00, 250.01). 03/23/16   [name], MD  ciclopirox (PENLAC) 8 % solution Apply topically at bedtime. Apply over nail & surrounding skin. Apply QD over previous coat.After7 days, may remove w alcohol, repeat 12/07/17   [name], MD  diclofenac (VOLTAREN) 75 MG EC tablet Take 1 tablet (75 mg total) by mouth 2 (two) times daily. 03/16/19 03/15/20  Rosemarie Ax, MD  fluticasone (FLONASE) 50 MCG/ACT nasal spray Place 2 sprays into both nostrils daily. 05/18/19   [name], MD  furosemide (LASIX) 40 MG tablet Take 1 tablet (40 mg total) by mouth 2 (two) times daily. 05/09/19   [name], MD  gabapentin (NEURONTIN) 100 MG capsule Take 1 capsule (100 mg total) by mouth 3 (three) times daily. 08/16/19   Rosemarie Ax, MD  hydrocortisone (ANUSOL-HC) 2.5 % rectal cream Place rectally as needed. 02/12/19   [name], MD  hydrOXYzine (ATARAX/VISTARIL) 10 MG tablet Take 1 tablet (10 mg total) by mouth 3 (three) times daily as needed. 07/20/19   [name], MD  ketoconazole (NIZORAL) 2 % cream Apply 1 application topically daily. Apply to both feet and between toes once daily for 6 weeks. 11/07/18   Marzetta Board, DPM  levocetirizine (XYZAL) 5 MG tablet Take 1 tablet (5 mg total) by mouth every evening. 05/09/19   [name], MD  linaclotide [facility]) 290 MCG CAPS capsule Take 1 capsule (290 mcg total) by mouth daily before breakfast. 02/12/19   Burns, Claudina Lick, MD  meclizine  (ANTIVERT) 25 MG tablet Take 1 tablet (25 mg total) by mouth 3 (three) times daily as needed for dizziness or nausea. 03/23/16   [name], MD  metFORMIN (GLUCOPHAGE) 500 MG tablet Take 1 tablet (500 mg total) by mouth daily with breakfast. 04/02/19   [name], MD  Multiple Vitamins-Minerals (MULTIVITAMIN WITH MINERALS) tablet Take 1 tablet by mouth daily.    [provider]  NONFORMULARY OR COMPOUNDED ITEM [facility]:  Antifungal - Terbinafine 3%, Fluconazole 2%, Tea Tree Oin 5%, Urea 10%, Ibuprofen 2% in DMSO #66m. Apply to the affected nail(s) once (  at bedtime) or twice daily. 11/08/18   Marzetta Board, DPM  omeprazole (PRILOSEC) 20 MG capsule Take 1 capsule (20 mg total) by mouth daily. 05/18/19   [name], MD  ondansetron (ZOFRAN) 4 MG tablet Take 1 tablet (4 mg total) by mouth every 8 (eight) hours as needed for nausea or vomiting. 05/01/19   Biagio Borg, MD  potassium chloride SA (K-DUR,KLOR-CON) 20 MEQ tablet Take 1 tablet (20 mEq total) by mouth 2 (two) times daily. 02/14/18   [name], MD  promethazine (PHENERGAN) 25 MG tablet Take 1 tablet (25 mg total) by mouth every 8 (eight) hours as needed for up to 7 days for nausea or vomiting (related to dizziness). 12/07/17 12/14/17  [name], MD  simvastatin (ZOCOR) 40 MG tablet Take 1 tablet (40 mg total) by mouth daily at 6 PM. 05/09/19   Burns, Claudina Lick, MD  SUMAtriptan-naproxen (TREXIMET) 85-500 MG tablet Take 1 tablet by mouth every 2 (two) hours as needed for migraine. 08/11/18   [name], MD  venlafaxine XR (EFFEXOR-XR) 150 MG 24 hr capsule Take 150 mg by mouth 2 (two) times daily.    [provider]  Vitamin D, Ergocalciferol, (DRISDOL) 1.25 MG (50000 UT) CAPS capsule Take 1 capsule (50,000 Units total) by mouth every 7 (seven) days. 08/28/18   Jearld Lesch A, DO  oxycodone (OXY-IR) 5 MG capsule Take 5 mg by mouth every 4 (four) hours as needed. For pain.  02/27/15  [provider]    Family History Family History  Problem Relation Age of Onset  . Heart disease Mother        Enlarged Heart, Pacemaker  . Diabetes Mother   . Thyroid disease Mother   . Hyperlipidemia Mother   . Hypertension Mother   . Sleep apnea Mother   . Dementia Father   . Kidney failure Father   . Prostate cancer Father   . Alcoholism Father   . Asthma Other   . Allergies Sister   . Asthma Son   . Breast cancer Maternal Aunt        cousin  . Colon cancer Cousin   . Heart disease Son        CHF, morbid obesity  . Prostate cancer Maternal Uncle   . Diabetes Son     Social History Social History   Tobacco Use  . Smoking status: Never Smoker  . Smokeless tobacco: Never Used  . Tobacco comment: tried to as a teenager  Substance Use Topics  . Alcohol use: Yes    Alcohol/week: 1.0 standard drinks    Types: 1 Glasses of wine per week    Comment: one drink a month  . Drug use: No     Allergies   Seroquel [quetiapine fumarate], Codeine, Guaifenesin-codeine, and Wellbutrin [bupropion]   Review of Systems Review of Systems  Constitutional: Negative for activity change, appetite change, fatigue and fever.  HENT: Negative for ear pain, sinus pain, sore throat and voice change.   Eyes: Negative for pain, redness and visual disturbance.  Respiratory: Positive for shortness of breath. Negative for cough, chest tightness and wheezing.   Cardiovascular: Positive for leg swelling. Negative for chest pain and palpitations.  Gastrointestinal: Negative for abdominal pain, diarrhea and vomiting.  Musculoskeletal: Negative for arthralgias and myalgias.  Skin: Negative for rash and wound.  Neurological: Negative for syncope and headaches.     Physical Exam Triage Vital Signs ED Triage Vitals  Enc  Vitals Group     BP 08/22/19 1320 137/86     Pulse Rate 08/22/19 1320 72     Resp 08/22/19 1320 18     Temp 08/22/19 1320 98.1 F (36.7 C)     Temp Source 08/22/19 1320 Oral     SpO2  08/22/19 1320 98 %     Weight --      Height --      Head Circumference --      Peak Flow --      Pain Score 08/22/19 1314 0     Pain Loc --      Pain Edu? --      Excl. in [place]? --    No data found.  Updated Vital Signs BP 137/86 (BP Location: Left Arm)   Pulse 72   Temp 98.1 F (36.7 C) (Oral)   Resp 18   SpO2 98%   Visual Acuity Right Eye Distance:   Left Eye Distance:   Bilateral Distance:    Right Eye Near:   Left Eye Near:    Bilateral Near:     Physical Exam Constitutional:      General: She is not in acute distress.    Appearance: She is well-developed. She is obese. She is not ill-appearing.  HENT:     Head: Normocephalic and atraumatic.     Mouth/Throat:     Mouth: Mucous membranes are moist.     Pharynx: Oropharynx is clear.  Eyes:     General: No scleral icterus.    Pupils: Pupils are equal, round, and reactive to light.  Neck:     Vascular: No JVD.     Trachea: No tracheal deviation.  Cardiovascular:     Rate and Rhythm: Normal rate and regular rhythm.  No extrasystoles are present.    Heart sounds: No murmur. No gallop.   Pulmonary:     Effort: Pulmonary effort is normal. No tachypnea, accessory muscle usage or respiratory distress.     Breath sounds: No stridor. No decreased breath sounds, wheezing, rhonchi or rales.  Musculoskeletal: Normal range of motion.     Right lower leg: She exhibits no tenderness. Edema present.     Left lower leg: She exhibits no tenderness. Edema present.     Comments: Lower extremities with nonpitting edema  Skin:    General: Skin is warm.     Capillary Refill: Capillary refill takes less than 2 seconds.     Coloration: Skin is not cyanotic, jaundiced or pale.  Neurological:     Mental Status: She is alert and oriented to person, place, and time.      UC Treatments / Results  Labs (all labs ordered are listed, but only abnormal results are displayed) Labs Reviewed  POCT FASTING [facility] [place]    EKG    Radiology Dg Chest 2 View  Result Date: 08/22/2019 CLINICAL DATA:  Shortness of breath and chest tightness for 1 week. Lower extremity edema. EXAM: CHEST - 2 VIEW COMPARISON:  Chest x-ray dated 05/11/2019 FINDINGS: The heart size and mediastinal contours are within normal limits. Both lungs are clear. The visualized skeletal structures are unremarkable. IMPRESSION: Normal exam. Electronically Signed   By: Lorriane Shire M.D.   On: 08/22/2019 14:00    Procedures Procedures (including critical care time)  Medications Ordered in UC Medications - No data to display  Initial Impression / Assessment and Plan / UC Course  I have reviewed the triage vital signs and  the nursing notes.  Pertinent labs & imaging results that were available during my care of the patient were reviewed by me and considered in my medical decision making (see chart for details).     Chest x-ray done in office, reviewed by me and radiology.  Compared to previous from 2018: Heart size and mediastinal contours WNL.  No consolidation, pneumothorax, pulmonary edema observed.  Overall stable.  Reviewed findings with patient who verbalized understanding.  Lower concern for acute heart failure given chest x-ray.  Patient to continue home medications, follow-up with her specialists and PCP.  ER return precautions discussed, patient verbalized understanding and is agreeable to plan. Final Clinical Impressions(s) / UC Diagnoses   Final diagnoses:  Dyspnea on exertion     Discharge Instructions     Important follow-up with your PCP about fluid pills, GI referral. No evidence of fluid on lungs on chest x-ray: Recommend following up with your sleep doctor and cardiologist for further evaluation/management. Go to ER sooner than if you develop worsening shortness of breath, chest pain, lightheadedness, or pass out.    ED Prescriptions    None     PDMP not reviewed this encounter.   Neldon Mc [place], [place]  08/22/19 1949

## 2019-08-22 NOTE — Discharge Instructions (Signed)
Important follow-up with your PCP about fluid pills, GI referral. No evidence of fluid on lungs on chest x-ray: Recommend following up with your sleep doctor and cardiologist for further evaluation/management. Go to ER sooner than if you develop worsening shortness of breath, chest pain, lightheadedness, or pass out.

## 2019-08-22 NOTE — ED Triage Notes (Signed)
Pt states went to see her sleep study MD and he said he heard fluid on her lungs. States she takes her fluid pill 2x a day. States she is having some SOB

## 2019-08-23 ENCOUNTER — Ambulatory Visit (INDEPENDENT_AMBULATORY_CARE_PROVIDER_SITE_OTHER): Payer: Medicare Other | Admitting: Internal Medicine

## 2019-08-23 ENCOUNTER — Other Ambulatory Visit (INDEPENDENT_AMBULATORY_CARE_PROVIDER_SITE_OTHER): Payer: Medicare Other

## 2019-08-23 ENCOUNTER — Other Ambulatory Visit: Payer: Self-pay

## 2019-08-23 ENCOUNTER — Encounter: Payer: Self-pay | Admitting: Internal Medicine

## 2019-08-23 ENCOUNTER — Ambulatory Visit (INDEPENDENT_AMBULATORY_CARE_PROVIDER_SITE_OTHER)
Admission: RE | Admit: 2019-08-23 | Discharge: 2019-08-23 | Disposition: A | Discharge status: Home / Self Care | Payer: Medicare Other | Source: Ambulatory Visit | Attending: Internal Medicine | Admitting: Internal Medicine

## 2019-08-23 VITALS — BP 130/86 | HR 74 | Temp 98.2°F | Ht 67.0 in | Wt 252.0 lb

## 2019-08-23 DIAGNOSIS — R1084 Generalized abdominal pain: Secondary | ICD-10-CM

## 2019-08-23 DIAGNOSIS — R609 Edema, unspecified: Secondary | ICD-10-CM

## 2019-08-23 DIAGNOSIS — I5030 Unspecified diastolic (congestive) heart failure: Secondary | ICD-10-CM | POA: Diagnosis not present

## 2019-08-23 DIAGNOSIS — R110 Nausea: Secondary | ICD-10-CM

## 2019-08-23 DIAGNOSIS — K5909 Other constipation: Secondary | ICD-10-CM | POA: Diagnosis not present

## 2019-08-23 DIAGNOSIS — R109 Unspecified abdominal pain: Secondary | ICD-10-CM | POA: Insufficient documentation

## 2019-08-23 DIAGNOSIS — K5900 Constipation, unspecified: Secondary | ICD-10-CM | POA: Diagnosis not present

## 2019-08-23 DIAGNOSIS — I6523 Occlusion and stenosis of bilateral carotid arteries: Secondary | ICD-10-CM | POA: Diagnosis not present

## 2019-08-23 LAB — CBC WITH DIFFERENTIAL/PLATELET
Basophils Absolute: 0 10*3/uL (ref 0.0–0.1)
Basophils Relative: 0.5 % (ref 0.0–3.0)
Eosinophils Absolute: 0.1 10*3/uL (ref 0.0–0.7)
Eosinophils Relative: 1.5 % (ref 0.0–5.0)
HCT: 37.2 % (ref 36.0–46.0)
Hemoglobin: 12.2 g/dL (ref 12.0–15.0)
Lymphocytes Relative: 27.2 % (ref 12.0–46.0)
Lymphs Abs: 2 10*3/uL (ref 0.7–4.0)
MCHC: 32.8 g/dL (ref 30.0–36.0)
MCV: 86.6 fl (ref 78.0–100.0)
Monocytes Absolute: 0.6 10*3/uL (ref 0.1–1.0)
Monocytes Relative: 7.5 % (ref 3.0–12.0)
Neutro Abs: 4.7 10*3/uL (ref 1.4–7.7)
Neutrophils Relative %: 63.3 % (ref 43.0–77.0)
Platelets: 208 10*3/uL (ref 150.0–400.0)
RBC: 4.29 Mil/uL (ref 3.87–5.11)
RDW: 14.8 % (ref 11.5–15.5)
WBC: 7.4 10*3/uL (ref 4.0–10.5)

## 2019-08-23 LAB — HEPATIC FUNCTION PANEL
ALT: 12 U/L (ref 0–35)
AST: 17 U/L (ref 0–37)
Albumin: 4.2 g/dL (ref 3.5–5.2)
Alkaline Phosphatase: 154 U/L — ABNORMAL HIGH (ref 39–117)
Bilirubin, Direct: 0 mg/dL (ref 0.0–0.3)
Total Bilirubin: 0.4 mg/dL (ref 0.2–1.2)
Total Protein: 7.7 g/dL (ref 6.0–8.3)

## 2019-08-23 LAB — URINALYSIS, ROUTINE W REFLEX MICROSCOPIC
Bilirubin Urine: NEGATIVE
Hgb urine dipstick: NEGATIVE
Ketones, ur: NEGATIVE
Leukocytes,Ua: NEGATIVE
Nitrite: NEGATIVE
RBC / HPF: NONE SEEN (ref 0–?)
Specific Gravity, Urine: 1.015 (ref 1.000–1.030)
Total Protein, Urine: NEGATIVE
Urine Glucose: NEGATIVE
Urobilinogen, UA: 1 (ref 0.0–1.0)
pH: 6.5 (ref 5.0–8.0)

## 2019-08-23 LAB — BASIC METABOLIC PANEL
BUN: 14 mg/dL (ref 6–23)
CO2: 31 mEq/L (ref 19–32)
Calcium: 9.8 mg/dL (ref 8.4–10.5)
Chloride: 102 mEq/L (ref 96–112)
Creatinine, Ser: 0.8 mg/dL (ref 0.40–1.20)
GFR: 86.91 mL/min (ref >60.00)
Glucose, Bld: 103 mg/dL — ABNORMAL HIGH (ref 70–99)
Potassium: 3.8 mEq/L (ref 3.5–5.1)
Sodium: 140 mEq/L (ref 135–145)

## 2019-08-23 LAB — BRAIN NATRIURETIC PEPTIDE: Pro B Natriuretic peptide (BNP): 29 pg/mL (ref 0.0–100.0)

## 2019-08-23 LAB — LIPASE: Lipase: 32 U/L (ref 11.0–59.0)

## 2019-08-23 NOTE — Assessment & Plan Note (Addendum)
For BNP with labs, I suspect venous insufficiency  Note:  Total time for pt hx, exam, review of record with pt in the room, determination of diagnoses and plan for further eval and tx is > 40 min, with over 50% spent in coordination and counseling of patient including the differential dx, tx, further evaluation and other management of edema, nausea, chronic constipation, abd pain

## 2019-08-23 NOTE — Progress Notes (Signed)
Subjective:    Patient ID: Erica [name] Alert, female    DOB: May 04, 1956, 63 y.o.   MRN: [id number]  HPI here to f/u recent UC visit for dyspnea and leg swelling, has f/u tomorrow with cardiology.  Pt denies chest pain, increased sob or doe, wheezing, orthopnea, PND, palpitations, dizziness or syncope.  Denies worsening reflux, dysphagia, vomiting, bowel change or blood except for worsening constiaption in last 2 week, with hard knots despite the suppository, as well as abd pain, worse to the right mid and lower quadrant and nausea, and lightheaded.  Taking linzess, even at highest dose linzess at 290.  Has taken along with it veggie laxatives and the glycerin suppository.  Metamucil no help as well.  Has long hx of chronic constipation.  More stress recently with mother died sept 60, and son died 5 yrs ago about the same day of the year.  Long hx of anxiety and also hx of withdrawal from xanax at one point per pt, now at 1 mg tid prn, in addition to atarax prn panic. Has been on metformin and statin for many months, not likely related to constipatoin. COVID neg 3 days ago   Pt denies polydipsia, polyuria Past Medical History:  Diagnosis Date  . Abnormal stress test    a. 09/2016: NST showed a small defect of mild severity present in the basal inferoseptal and mid inferoseptal location, consistent with ischemia. --> medically managed  . ALLERGIC RHINITIS   . Anxiety   . Bursitis   . DDD (degenerative disc disease), lumbar    ESI with [facility] (spring 2016)  . Depression   . Diabetes mellitus without complication ([place])   . Dyslipidemia   . External hemorrhoids   . GERD (gastroesophageal reflux disease)   . Hypertension   . Obesity   . Osteoarthritis   . Palpitations    a. prior event monitor showing sinus tachycardia, no PAF.   Marland Kitchen Panic attacks    Hx of depression  . Sleep apnea    CPAP hs   Past Surgical History:  Procedure Laterality Date  . ABDOMINAL HYSTERECTOMY    . MASS EXCISION  Left 12/30/2015   Procedure: EXCISION LEFT LEG MASS;  Surgeon: Donnie Mesa, MD;  Location: [facility];  Service: General;  Laterality: Left;  . REPLACEMENT TOTAL KNEE Right   . RIGHT OOPHORECTOMY     No cancer    reports that she has never smoked. She has never used smokeless tobacco. She reports current alcohol use of about 1.0 standard drinks of alcohol per week. She reports that she does not use drugs. family history includes Alcoholism in her father; Allergies in her sister; Asthma in her son and another family member; Breast cancer in her maternal aunt; Colon cancer in her cousin; Dementia in her father; Diabetes in her mother and son; Heart disease in her mother and son; Hyperlipidemia in her mother; Hypertension in her mother; Kidney failure in her father; Prostate cancer in her father and maternal uncle; Sleep apnea in her mother; Thyroid disease in her mother. Allergies  Allergen Reactions  . Seroquel [Quetiapine Fumarate]     Hallucinations, palpitations  . Codeine Nausea And Vomiting  . Guaifenesin-Codeine Other (See Comments)    REACTION: feels spacey  . Wellbutrin [Bupropion] Palpitations    hallucinations   Review of Systems  Constitutional: Negative for other unusual diaphoresis or sweats HENT: Negative for ear discharge or swelling Eyes: Negative for other worsening visual disturbances Respiratory: Negative  for stridor or other swelling  Gastrointestinal: Negative for worsening distension or other blood Genitourinary: Negative for retention or other urinary change Musculoskeletal: Negative for other MSK pain or swelling Skin: Negative for color change or other new lesions Neurological: Negative for worsening tremors and other numbness  Psychiatric/Behavioral: Negative for worsening agitation or other fatigue All otherwise neg per pt     Objective:   Physical Exam BP 130/86   Pulse 74   Temp 98.2 F (36.8 C) (Oral)   Ht 5\' 7"  (1.702 m)   Wt 252 lb  (114.3 kg)   SpO2 96%   BMI 39.47 kg/m  VS noted,  Constitutional: Pt appears in NAD HENT: Head: NCAT.  Right Ear: External ear normal.  Left Ear: External ear normal.  Eyes: . Pupils are equal, round, and reactive to light. Conjunctivae and EOM are normal Nose: without d/c or deformity Neck: Neck supple. Gross normal ROM Cardiovascular: Normal rate and regular rhythm.   Pulmonary/Chest: Effort normal and breath sounds without rales or wheezing.  Abd:  Soft, NT, ND, + BS, no organomegaly Neurological: Pt is alert. At baseline orientation, motor grossly intact Skin: Skin is warm. No rashes, other new lesions, trace to 1+ LE edema Psychiatric: Pt behavior is normal without agitation  All otherwise neg per pt   Lab Results  Component Value Date   WBC 7.2 05/11/2019   HGB 13.0 05/11/2019   HCT 40.8 05/11/2019   PLT 272 05/11/2019   GLUCOSE 134 (H) 05/11/2019   CHOL 183 02/09/2018   TRIG 99 02/09/2018   HDL 55 02/09/2018   LDLCALC 108 (H) 02/09/2018   ALT 12 05/11/2019   AST 16 05/11/2019   NA 138 05/11/2019   K 3.7 05/11/2019   CL 101 05/11/2019   CREATININE 0.72 05/11/2019   BUN 14 05/11/2019   CO2 23 05/11/2019   TSH 2.717 05/11/2019   HGBA1C 6.1 (A) 05/18/2019   MICROALBUR <0.7 05/18/2019      Assessment & Plan:

## 2019-08-23 NOTE — Assessment & Plan Note (Signed)
Exam benign, for labs as ordered

## 2019-08-23 NOTE — Assessment & Plan Note (Signed)
On high dose linzess, but not working well recently, for GI referal as per pt request

## 2019-08-23 NOTE — Assessment & Plan Note (Signed)
For anti emetic, most likley related to constipation

## 2019-08-23 NOTE — Progress Notes (Signed)
Cardiology Office Note   Date:  08/24/2019   ID:  Erica [name], DOB April 17, 1956, MRN [id number]  PCP:  [name], MD  Cardiologist:   No primary care provider on file.  No chief complaint on file.    History of Present Illness:  Erica [name] is a 63 y.o. female who presents for follow up of palpitations.  She was in the emergency room on 7/31 for this.  It turns out there was a medication error.  She had run out of Xanax and was actually taking Seroquel.  Her EKG was unremarkable.  High-sensitivity troponin was normal.  There was not thought to be any cardiac etiology.   She was in the ED on 11/11 with dyspnea.  I reviewed these records for this visit.    CXR was normal.  BNP was normal.    She was told to come back and see me to make sure she was not having any heart failure.  Of note she saw her primary provider yesterday and had another chest x-ray which along with the lung the day before demonstrated no edema.  BNP was normal.  She is really not complaining of PND orthopnea or other symptoms consistent with acute heart failure.  She might of had a little swelling in her ankles.  She is not describing any chest pressure, neck or arm discomfort.  Has had no weight gain She has had no cough fevers or chills.  She is just tired all the time.  She does not sleep well.  She try to get her CPAP fixed.   Past Medical History:  Diagnosis Date  . Abnormal stress test    a. 09/2016: NST showed a small defect of mild severity present in the basal inferoseptal and mid inferoseptal location, consistent with ischemia. --> medically managed  . ALLERGIC RHINITIS   . Anxiety   . Bursitis   . DDD (degenerative disc disease), lumbar    ESI with Ramos (spring 2016)  . Depression   . Diabetes mellitus without complication ([place])   . Dyslipidemia   . External hemorrhoids   . GERD (gastroesophageal reflux disease)   . Hypertension   . Obesity   . Osteoarthritis   . Palpitations     a. prior event monitor showing sinus tachycardia, no PAF.   Marland Kitchen Panic attacks    Hx of depression  . Sleep apnea    CPAP hs    Past Surgical History:  Procedure Laterality Date  . ABDOMINAL HYSTERECTOMY    . MASS EXCISION Left 12/30/2015   Procedure: EXCISION LEFT LEG MASS;  Surgeon: Donnie Mesa, MD;  Location: [facility];  Service: General;  Laterality: Left;  . REPLACEMENT TOTAL KNEE Right   . RIGHT OOPHORECTOMY     No cancer     Current Outpatient Medications  Medication Sig Dispense Refill  . albuterol (VENTOLIN HFA) 108 (90 Base) MCG/ACT inhaler Inhale 2 puffs into the lungs every 6 (six) hours as needed for wheezing or shortness of breath. 3 Inhaler 3  . ALPRAZolam (XANAX) 0.25 MG tablet Take 1 tablet (0.25 mg total) by mouth 3 (three) times daily as needed for anxiety. (Patient taking differently: Take 1 mg by mouth 3 (three) times daily as needed for anxiety. ) 12 tablet 0  . atenolol (TENORMIN) 25 MG tablet Take 69m (1 tablet) in the AM and 523m(2 tablets) in the PM 180 tablet 0  . baclofen (LIORESAL) 10 MG tablet  TAKE ONE TABLET BY MOUTH TWICE A DAY AS NEEDED FOR MUSCLE SPASMS 30 each 0  . blood glucose meter kit and supplies KIT Dispense based on patient and insurance preference. Use up to four times daily as directed. (FOR ICD-9 250.00, 250.01). 1 each 0  . ciclopirox (PENLAC) 8 % solution Apply topically at bedtime. Apply over nail & surrounding skin. Apply QD over previous coat.After7 days, may remove w alcohol, repeat 6.6 mL 3  . diclofenac (VOLTAREN) 75 MG EC tablet Take 1 tablet (75 mg total) by mouth 2 (two) times daily. 60 tablet 1  . fluticasone (FLONASE) 50 MCG/ACT nasal spray Place 2 sprays into both nostrils daily. 48 g 11  . furosemide (LASIX) 40 MG tablet Take 1 tablet (40 mg total) by mouth 2 (two) times daily. 120 tablet 0  . gabapentin (NEURONTIN) 100 MG capsule Take 1 capsule (100 mg total) by mouth 3 (three) times daily. 90 capsule 1  .  hydrocortisone (ANUSOL-HC) 2.5 % rectal cream Place rectally as needed. 30 g 3  . hydrOXYzine (ATARAX/VISTARIL) 10 MG tablet Take 1 tablet (10 mg total) by mouth 3 (three) times daily as needed. 30 tablet 0  . ketoconazole (NIZORAL) 2 % cream Apply 1 application topically daily. Apply to both feet and between toes once daily for 6 weeks. 30 g 1  . levocetirizine (XYZAL) 5 MG tablet Take 1 tablet (5 mg total) by mouth every evening. 90 tablet 1  . linaclotide (LINZESS) 290 MCG CAPS capsule Take 1 capsule (290 mcg total) by mouth daily before breakfast. 90 capsule 1  . meclizine (ANTIVERT) 25 MG tablet Take 1 tablet (25 mg total) by mouth 3 (three) times daily as needed for dizziness or nausea. 180 tablet 3  . metFORMIN (GLUCOPHAGE) 500 MG tablet Take 1 tablet (500 mg total) by mouth daily with breakfast. 90 tablet 1  . Multiple Vitamins-Minerals (MULTIVITAMIN WITH MINERALS) tablet Take 1 tablet by mouth daily.    . NONFORMULARY OR COMPOUNDED ITEM [facility]:  Antifungal - Terbinafine 3%, Fluconazole 2%, Tea Tree Oin 5%, Urea 10%, Ibuprofen 2% in DMSO #72m. Apply to the affected nail(s) once (at bedtime) or twice daily. 30 each 5  . omeprazole (PRILOSEC) 20 MG capsule Take 1 capsule (20 mg total) by mouth daily. 90 capsule 1  . ondansetron (ZOFRAN) 4 MG tablet Take 1 tablet (4 mg total) by mouth every 8 (eight) hours as needed for nausea or vomiting. 40 tablet 0  . potassium chloride SA (K-DUR,KLOR-CON) 20 MEQ tablet Take 1 tablet (20 mEq total) by mouth 2 (two) times daily. 180 tablet 1  . simvastatin (ZOCOR) 40 MG tablet Take 1 tablet (40 mg total) by mouth daily at 6 PM. 60 tablet 0  . SUMAtriptan-naproxen (TREXIMET) 85-500 MG tablet Take 1 tablet by mouth every 2 (two) hours as needed for migraine. 10 tablet 8  . venlafaxine XR (EFFEXOR-XR) 150 MG 24 hr capsule Take 150 mg by mouth 2 (two) times daily.    . Vitamin D, Ergocalciferol, (DRISDOL) 1.25 MG (50000 UT) CAPS capsule Take 1 capsule  (50,000 Units total) by mouth every 7 (seven) days. 4 capsule 0  . promethazine (PHENERGAN) 25 MG tablet Take 1 tablet (25 mg total) by mouth every 8 (eight) hours as needed for up to 7 days for nausea or vomiting (related to dizziness). 30 tablet 2   No current facility-administered medications for this visit.     Allergies:   Seroquel [quetiapine fumarate], Codeine, Guaifenesin-codeine, and  Wellbutrin [bupropion]    ROS:  Please see the history of present illness.   Otherwise, review of systems are positive for none All other systems are reviewed and negative.    PHYSICAL EXAM: VS:  BP 108/60   Pulse 60   Ht '5\' 7"'  (1.702 m)   Wt 252 lb (114.3 kg)   BMI 39.47 kg/m  , BMI Body mass index is 39.47 kg/m. GENERAL:  Well appearing NECK:  No jugular venous distention, waveform within normal limits, carotid upstroke brisk and symmetric, no bruits, no thyromegaly LUNGS:  Clear to auscultation bilaterally CHEST:  Unremarkable HEART:  PMI not displaced or sustained,S1 and S2 within normal limits, no S3, no S4, no clicks, no rubs, no murmurs ABD:  Flat, positive bowel sounds normal in frequency in pitch, no bruits, no rebound, no guarding, no midline pulsatile mass, no hepatomegaly, no splenomegaly EXT:  2 plus pulses throughout, no edema, no cyanosis no clubbing   EKG:  EKG is  not ordered today.    Recent Labs: 05/11/2019: TSH 2.717 08/23/2019: ALT 12; BUN 14; Creatinine, Ser 0.80; Hemoglobin 12.2; Platelets 208.0; Potassium 3.8; Pro B Natriuretic peptide (BNP) 29.0; Sodium 140    Lipid Panel    Component Value Date/Time   CHOL 183 02/09/2018 1053   TRIG 99 02/09/2018 1053   TRIG 51 09/20/2006 0915   HDL 55 02/09/2018 1053   CHOLHDL 4 09/27/2017 1204   VLDL 26.4 09/27/2017 1204   LDLCALC 108 (H) 02/09/2018 1053      Wt Readings from Last 3 Encounters:  08/24/19 252 lb (114.3 kg)  08/23/19 252 lb (114.3 kg)  08/08/19 250 lb (113.4 kg)      Other studies  Reviewed: Additional studies/ records that were reviewed today include: None Review of the above records demonstrates:  NA   ASSESSMENT AND PLAN:   WHEEZING Apparently during her sleep study there was some wheezing or crackles or tachypnea but I see no evidence of respiratory failure or excess fluids.  She has had 2 chest x-rays and a BNP as above.  She is not particularly describing excessive shortness of breath.  No change in therapy.  Abnormal Stress Test She had a low risk study.  She does not have any symptoms consistent with angina.  No change in therapy.   HTN Her blood pressure is well controlled.  No change in therapy.   HLD LDL was 108.  No change in therapy.   CAROTID STENOSIS She had 60 - 79% stenosis on the left in Sept.  This is to be repeated in one year. I reviewed this with her again today.    Current medicines are reviewed at length with the patient today.  The patient does not have concerns regarding medicines.  The following changes have been made:  None  Labs/ tests ordered today include: None  No orders of the defined types were placed in this encounter.    Disposition:   FU with me in one year.    Signed, Minus Breeding, MD  08/24/2019 7:53 AM    [facility]

## 2019-08-23 NOTE — Patient Instructions (Signed)
Please continue all other medications as before, and refills have been done if requested.  Please have the pharmacy call with any other refills you may need.  Please continue your efforts at being more active, low cholesterol diet, and weight control.  You are otherwise up to date with prevention measures today.  Please keep your appointments with your specialists as you may have planned  You will be contacted regarding the referral for: Gastroenterology  Please go to the XRAY Department in the Basement (go straight as you get off the elevator) for the x-ray testing  Please go to the LAB in the Basement (turn left off the elevator) for the tests to be done today  You will be contacted by phone if any changes need to be made immediately.  Otherwise, you will receive a letter about your results with an explanation, but please check with MyChart first.  Please remember to sign up for MyChart if you have not done so, as this will be important to you in the future with finding out test results, communicating by private email, and scheduling acute appointments online when needed.

## 2019-08-24 ENCOUNTER — Ambulatory Visit (INDEPENDENT_AMBULATORY_CARE_PROVIDER_SITE_OTHER): Payer: Medicare Other | Admitting: Cardiology

## 2019-08-24 ENCOUNTER — Other Ambulatory Visit: Payer: Self-pay

## 2019-08-24 ENCOUNTER — Encounter: Payer: Self-pay | Admitting: Cardiology

## 2019-08-24 VITALS — BP 108/60 | HR 60 | Ht 67.0 in | Wt 252.0 lb

## 2019-08-24 DIAGNOSIS — I10 Essential (primary) hypertension: Secondary | ICD-10-CM

## 2019-08-24 DIAGNOSIS — I6523 Occlusion and stenosis of bilateral carotid arteries: Secondary | ICD-10-CM

## 2019-08-24 DIAGNOSIS — E785 Hyperlipidemia, unspecified: Secondary | ICD-10-CM

## 2019-08-24 DIAGNOSIS — R0602 Shortness of breath: Secondary | ICD-10-CM

## 2019-08-24 NOTE — Patient Instructions (Signed)
Medication Instructions:  NO CHANGE *If you need a refill on your cardiac medications before your next appointment, please call your pharmacy*  Lab Work: If you have labs (blood work) drawn today and your tests are completely normal, you will receive your results only by: . MyChart Message (if you have MyChart) OR . A paper copy in the mail If you have any lab test that is abnormal or we need to change your treatment, we will call you to review the results.  Follow-Up: At [facility], you and your health needs are our priority.  As part of our continuing mission to provide you with exceptional heart care, we have created designated Provider Care Teams.  These Care Teams include your primary Cardiologist (physician) and Advanced Practice Providers (APPs -  Physician Assistants and Nurse Practitioners) who all work together to provide you with the care you need, when you need it.  Your next appointment:   12 months  The format for your next appointment:   In Person  Provider:   James Hochrein, MD   

## 2019-08-27 ENCOUNTER — Ambulatory Visit: Payer: Medicare Other | Admitting: Internal Medicine

## 2019-08-29 ENCOUNTER — Encounter: Payer: Self-pay | Admitting: Gastroenterology

## 2019-08-30 ENCOUNTER — Ambulatory Visit (INDEPENDENT_AMBULATORY_CARE_PROVIDER_SITE_OTHER): Payer: Medicare Other | Admitting: Podiatry

## 2019-08-30 ENCOUNTER — Other Ambulatory Visit: Payer: Self-pay

## 2019-08-30 ENCOUNTER — Encounter: Payer: Self-pay | Admitting: Podiatry

## 2019-08-30 DIAGNOSIS — L600 Ingrowing nail: Secondary | ICD-10-CM | POA: Diagnosis not present

## 2019-08-30 DIAGNOSIS — I6523 Occlusion and stenosis of bilateral carotid arteries: Secondary | ICD-10-CM

## 2019-08-31 ENCOUNTER — Other Ambulatory Visit: Payer: Self-pay

## 2019-09-03 NOTE — Progress Notes (Signed)
Subjective:   Patient ID: Erica [name], female   DOB: 64 y.o.   MRN: [id number]   HPI Patient presents stating she has a damaged right big toenail that she is concerned about and states that it is loose and it needs to be removed   ROS      Objective:  Physical Exam  Neurovascular status unchanged with thick damaged right hallux nail that is loose on the distal one half with attachment on the proximal portion.  The other nails have some thickness and it appears to been some trauma to this big toenail     Assessment:  Damaged hallux nail right with probability for trauma leading to the pathology she is experiencing     Plan:  H&P reviewed condition and recommended nail removal and allowing new nail to regrow.  Discussed risk of this and she understands and today infiltrated with anesthetic and then using sterile instrumentation remove the hallux nail applied sterile dressing.  Gave instructions on soaks and will be seen back as needed and debrided remaining nails that were thick

## 2019-09-10 ENCOUNTER — Ambulatory Visit (HOSPITAL_COMMUNITY): Payer: Self-pay | Admitting: Licensed Clinical Social Worker

## 2019-09-13 ENCOUNTER — Ambulatory Visit: Payer: Self-pay | Admitting: *Deleted

## 2019-09-13 NOTE — Telephone Encounter (Signed)
Patient is calling to report she has had elevated fasting glucose for several days- does not seem to be improving. Call to office for appointment.  Reason for Disposition . [1] Blood glucose > 300 mg/dL (16.7 mmol/L) AND [2] two or more times in a row  Answer Assessment - Initial Assessment Questions 1. BLOOD GLUCOSE: "What is your blood glucose level?"      Last Saturday- patient reports elevation in levels. 299 this morning 2. ONSET: "When did you check the blood glucose?"     9 am 3. USUAL RANGE: "What is your glucose level usually?" (e.g., usual fasting morning value, usual evening value)     Fasting highs in the morning- high starting with Saturday- over 200- [id number] 4. KETONES: "Do you check for ketones (urine or blood test strips)?" If yes, ask: "What does the test show now?"      no 5. TYPE 1 or 2:  "Do you know what type of diabetes you have?"  (e.g., Type 1, Type 2, Gestational; doesn't know)      Type 2 6. INSULIN: "Do you take insulin?" "What type of insulin(s) do you use? What is the mode of delivery? (syringe, pen; injection or pump)?"      No insulin 7. DIABETES PILLS: "Do you take any pills for your diabetes?" If yes, ask: "Have you missed taking any pills recently?"     Metformin, no missed doses  8. OTHER SYMPTOMS: "Do you have any symptoms?" (e.g., fever, frequent urination, difficulty breathing, dizziness, weakness, vomiting)     Sleepy a lot of time- she is on anxiety medication 9. PREGNANCY: "Is there any chance you are pregnant?" "When was your last menstrual period?"     n/a  Protocols used: DIABETES - HIGH BLOOD SUGAR-A-AH

## 2019-09-13 NOTE — Progress Notes (Signed)
Subjective:    Patient ID: Erica [name], female    DOB: 1955-11-11, 63 y.o.   MRN: [id number]  HPI The patient is here for an acute visit.   High fasting sugar:   Last Saturday her sugars started being high - high 200's- 300's.   She has eaten a little more sweets. She is taking her medications as prescribed.  She has not had an steroid injections.     Today she feels fine.  Her sugar here today is 115.    Medications and allergies reviewed with patient and updated if appropriate.  Patient Active Problem List   Diagnosis Date Noted  . Chronic constipation 08/23/2019  . Abdominal pain 08/23/2019  . Bilateral carotid artery stenosis 06/04/2019  . Osteopenia 05/19/2019  . Visual hallucinations 05/17/2019  . Sinus pressure 02/12/2019  . Acute pain of left knee 12/11/2018  . Class 2 severe obesity with serious comorbidity and body mass index (BMI) of 37.0 to 37.9 in adult ([place]) 08/31/2018  . Degeneration of lumbar intervertebral disc 05/30/2018  . Onychomycosis 12/07/2017  . Nausea without vomiting 12/07/2017  . Left hip pain 06/12/2017  . Acute left-sided low back pain without sciatica 06/12/2017  . Meningioma ([place]) 03/28/2017  . Panic attacks 09/27/2016  . GERD (gastroesophageal reflux disease) 09/27/2016  . Grief reaction 08/23/2016  . Right ankle pain 08/19/2016  . Diabetes ([place]) 02/15/2016  . Lump of skin 11/28/2015  . Migraine without aura and with status migrainosus, not intractable 10/03/2015  . Numbness of left hand 10/03/2015  . Cephalalgia 10/03/2015  . Abnormal ECG 09/11/2012  . Palpitations 09/08/2012  . Sciatica of left side   . Peripheral edema 05/09/2012  . FATIGUE 02/23/2010  . Obstructive sleep apnea 10/31/2009  . External hemorrhoids 10/17/2009  . CONSTIPATION, CHRONIC 10/17/2009  . Hyperlipidemia 01/12/2008  . Depression, major, recurrent ([place]) 01/12/2008  . Essential hypertension, benign 01/12/2008  . Allergic rhinitis 01/12/2008     Current Outpatient Medications on File Prior to Visit  Medication Sig Dispense Refill  . albuterol (VENTOLIN HFA) 108 (90 Base) MCG/ACT inhaler Inhale 2 puffs into the lungs every 6 (six) hours as needed for wheezing or shortness of breath. 3 Inhaler 3  . ALPRAZolam (XANAX) 0.25 MG tablet Take 1 tablet (0.25 mg total) by mouth 3 (three) times daily as needed for anxiety. (Patient taking differently: Take 1 mg by mouth 3 (three) times daily as needed for anxiety. ) 12 tablet 0  . baclofen (LIORESAL) 10 MG tablet TAKE ONE TABLET BY MOUTH TWICE A DAY AS NEEDED FOR MUSCLE SPASMS 30 each 0  . ciclopirox (PENLAC) 8 % solution Apply topically at bedtime. Apply over nail & surrounding skin. Apply QD over previous coat.After7 days, may remove w alcohol, repeat 6.6 mL 3  . diclofenac (VOLTAREN) 75 MG EC tablet Take 1 tablet (75 mg total) by mouth 2 (two) times daily. 60 tablet 1  . furosemide (LASIX) 40 MG tablet Take 1 tablet (40 mg total) by mouth 2 (two) times daily. 120 tablet 0  . gabapentin (NEURONTIN) 100 MG capsule Take 1 capsule (100 mg total) by mouth 3 (three) times daily. 90 capsule 1  . hydrocortisone (ANUSOL-HC) 2.5 % rectal cream Place rectally as needed. 30 g 3  . hydrOXYzine (ATARAX/VISTARIL) 10 MG tablet Take 1 tablet (10 mg total) by mouth 3 (three) times daily as needed. 30 tablet 0  . ketoconazole (NIZORAL) 2 % cream Apply 1 application topically daily. Apply to both feet  and between toes once daily for 6 weeks. 30 g 1  . levocetirizine (XYZAL) 5 MG tablet Take 1 tablet (5 mg total) by mouth every evening. 90 tablet 1  . meclizine (ANTIVERT) 25 MG tablet Take 1 tablet (25 mg total) by mouth 3 (three) times daily as needed for dizziness or nausea. 180 tablet 3  . Multiple Vitamins-Minerals (MULTIVITAMIN WITH MINERALS) tablet Take 1 tablet by mouth daily.    . NONFORMULARY OR COMPOUNDED ITEM [facility]:  Antifungal - Terbinafine 3%, Fluconazole 2%, Tea Tree Oin 5%, Urea 10%,  Ibuprofen 2% in DMSO #3ml. Apply to the affected nail(s) once (at bedtime) or twice daily. 30 each 5  . omeprazole (PRILOSEC) 20 MG capsule Take 1 capsule (20 mg total) by mouth daily. 90 capsule 1  . ondansetron (ZOFRAN) 4 MG tablet Take 1 tablet (4 mg total) by mouth every 8 (eight) hours as needed for nausea or vomiting. 40 tablet 0  . potassium chloride SA (K-DUR,KLOR-CON) 20 MEQ tablet Take 1 tablet (20 mEq total) by mouth 2 (two) times daily. 180 tablet 1  . SUMAtriptan-naproxen (TREXIMET) 85-500 MG tablet Take 1 tablet by mouth every 2 (two) hours as needed for migraine. 10 tablet 8  . venlafaxine XR (EFFEXOR-XR) 150 MG 24 hr capsule Take 150 mg by mouth 2 (two) times daily.    . Vitamin D, Ergocalciferol, (DRISDOL) 1.25 MG (50000 UT) CAPS capsule Take 1 capsule (50,000 Units total) by mouth every 7 (seven) days. 4 capsule 0  . promethazine (PHENERGAN) 25 MG tablet Take 1 tablet (25 mg total) by mouth every 8 (eight) hours as needed for up to 7 days for nausea or vomiting (related to dizziness). 30 tablet 2  . [DISCONTINUED] oxycodone (OXY-IR) 5 MG capsule Take 5 mg by mouth every 4 (four) hours as needed. For pain.     No current facility-administered medications on file prior to visit.     Past Medical History:  Diagnosis Date  . Abnormal stress test    a. 09/2016: NST showed a small defect of mild severity present in the basal inferoseptal and mid inferoseptal location, consistent with ischemia. --> medically managed  . ALLERGIC RHINITIS   . Anxiety   . Bursitis   . DDD (degenerative disc disease), lumbar    ESI with Ramos (spring 2016)  . Depression   . Diabetes mellitus without complication ([place])   . Dyslipidemia   . External hemorrhoids   . GERD (gastroesophageal reflux disease)   . Hypertension   . Obesity   . Osteoarthritis   . Palpitations    a. prior event monitor showing sinus tachycardia, no PAF.   Marland Kitchen Panic attacks    Hx of depression  . Sleep apnea    CPAP hs     Past Surgical History:  Procedure Laterality Date  . ABDOMINAL HYSTERECTOMY    . MASS EXCISION Left 12/30/2015   Procedure: EXCISION LEFT LEG MASS;  Surgeon: Donnie Mesa, MD;  Location: [facility];  Service: General;  Laterality: Left;  . REPLACEMENT TOTAL KNEE Right   . RIGHT OOPHORECTOMY     No cancer    Social History   Socioeconomic History  . Marital status: Married    Spouse name: [name]  . Number of children: 3  . Years of education: Not on file  . Highest education level: Not on file  Occupational History  . Occupation: Disabled  Social Needs  . Financial resource strain: Not on file  .  Food insecurity    Worry: Not on file    Inability: Not on file  . Transportation needs    Medical: Not on file    Non-medical: Not on file  Tobacco Use  . Smoking status: Never Smoker  . Smokeless tobacco: Never Used  . Tobacco comment: tried to as a teenager  Substance and Sexual Activity  . Alcohol use: Yes    Alcohol/week: 1.0 standard drinks    Types: 1 Glasses of wine per week    Comment: one drink a month  . Drug use: No  . Sexual activity: Not Currently  Lifestyle  . Physical activity    Days per week: Not on file    Minutes per session: Not on file  . Stress: Not on file  Relationships  . Social Herbalist on phone: Not on file    Gets together: Not on file    Attends religious service: Not on file    Active member of club or organization: Not on file    Attends meetings of clubs or organizations: Not on file    Relationship status: Not on file  Other Topics Concern  . Not on file  Social History Narrative   Married with children. Pt is on disablity. Previous worked in the school system    Family History  Problem Relation Age of Onset  . Heart disease Mother        Enlarged Heart, Pacemaker  . Diabetes Mother   . Thyroid disease Mother   . Hyperlipidemia Mother   . Hypertension Mother   . Sleep apnea Mother   . Dementia  Father   . Kidney failure Father   . Prostate cancer Father   . Alcoholism Father   . Asthma Other   . Allergies Sister   . Asthma Son   . Breast cancer Maternal Aunt        cousin  . Colon cancer Cousin   . Heart disease Son        CHF, morbid obesity  . Prostate cancer Maternal Uncle   . Diabetes Son     Review of Systems  Constitutional: Negative for chills and fever.  Eyes: Negative for visual disturbance.  Gastrointestinal: Negative for nausea.  Endocrine: Negative for polydipsia and polyuria.  Neurological: Negative for light-headedness and headaches.       Felt a little off balanced the past few days       Objective:   Vitals:   09/14/19 1042  BP: 124/76  Pulse: 66  Resp: 16  Temp: 98.6 F (37 C)  SpO2: 99%   BP Readings from Last 3 Encounters:  09/14/19 124/76  08/24/19 108/60  08/23/19 130/86   Wt Readings from Last 3 Encounters:  09/14/19 252 lb (114.3 kg)  08/24/19 252 lb (114.3 kg)  08/23/19 252 lb (114.3 kg)   Body mass index is 39.47 kg/m.   Physical Exam    Constitutional: Appears well-developed and well-nourished. No distress.  HENT:  Head: Normocephalic and atraumatic.  Neck: Neck supple. No tracheal deviation present. No thyromegaly present.  No cervical lymphadenopathy Cardiovascular: Normal rate, regular rhythm and normal heart sounds.   No murmur heard. No carotid bruit .  No edema Pulmonary/Chest: Effort normal and breath sounds normal. No respiratory distress. No has no wheezes. No rales.  Skin: Skin is warm and dry. Not diaphoretic.  Psychiatric: Normal mood and affect. Behavior is normal.       Assessment & Plan:  See Problem List for Assessment and Plan of chronic medical problems.    This visit occurred during the SARS-CoV-2 public health emergency.  Safety protocols were in place, including screening questions prior to the visit, additional usage of staff PPE, and extensive cleaning of exam room while observing  appropriate contact time as indicated for disinfecting solutions.

## 2019-09-13 NOTE — Telephone Encounter (Signed)
Patient has appointment in office tomorrow.

## 2019-09-14 ENCOUNTER — Other Ambulatory Visit: Payer: Self-pay

## 2019-09-14 ENCOUNTER — Ambulatory Visit (INDEPENDENT_AMBULATORY_CARE_PROVIDER_SITE_OTHER): Payer: Medicare Other | Admitting: Internal Medicine

## 2019-09-14 ENCOUNTER — Encounter: Payer: Self-pay | Admitting: Internal Medicine

## 2019-09-14 VITALS — BP 124/76 | HR 66 | Temp 98.6°F | Resp 16 | Ht 67.0 in | Wt 252.0 lb

## 2019-09-14 DIAGNOSIS — E119 Type 2 diabetes mellitus without complications: Secondary | ICD-10-CM | POA: Diagnosis not present

## 2019-09-14 DIAGNOSIS — I6523 Occlusion and stenosis of bilateral carotid arteries: Secondary | ICD-10-CM

## 2019-09-14 MED ORDER — METFORMIN HCL 500 MG PO TABS: 500.0000 mg | ORAL_TABLET | Freq: Every day | ORAL | 1 refills | Status: DC

## 2019-09-14 MED ORDER — SIMVASTATIN 40 MG PO TABS: 40.0000 mg | ORAL_TABLET | Freq: Every day | ORAL | 1 refills | Status: DC

## 2019-09-14 MED ORDER — ATENOLOL 25 MG PO TABS: ORAL_TABLET | ORAL | 1 refills | Status: DC

## 2019-09-14 MED ORDER — BLOOD GLUCOSE MONITOR KIT: PACK | 0 refills | Status: DC

## 2019-09-14 MED ORDER — LINACLOTIDE 290 MCG PO CAPS: 290.0000 ug | ORAL_CAPSULE | Freq: Every day | ORAL | 1 refills | Status: DC

## 2019-09-14 MED ORDER — FLUTICASONE PROPIONATE 50 MCG/ACT NA SUSP: 2.0000 | Freq: Every day | NASAL | 11 refills | Status: DC

## 2019-09-14 NOTE — Assessment & Plan Note (Signed)
The past few days she has had elevated sugars at home for no apparent reason She has felt her balance has been a little off, but otherwise has felt fine and today she feels normal No change in diet, except for maybe a little more sweets but not enough to count for this elevation in sugars.  No change in medications and no steroid injections She wonders if her machine is accurate Sugar here today well controlled and last A1c well controlled We will give her a prescription for new glucometer and she will monitor at home and let us know if it is elevated No change in medication

## 2019-09-14 NOTE — Patient Instructions (Signed)
  Your sugar here today was 115.  Medications reviewed and updated.  Changes include :   none   Your prescription(s) have been submitted to your pharmacy. Please take as directed and contact our office if you believe you are having problem(s) with the medication(s).

## 2019-09-17 ENCOUNTER — Other Ambulatory Visit: Payer: Self-pay | Admitting: Internal Medicine

## 2019-09-17 MED ORDER — POTASSIUM CHLORIDE CRYS ER 20 MEQ PO TBCR: 20.0000 meq | EXTENDED_RELEASE_TABLET | Freq: Two times a day (BID) | ORAL | 1 refills | Status: DC

## 2019-09-17 MED ORDER — OMEPRAZOLE 20 MG PO CPDR: 20.0000 mg | DELAYED_RELEASE_CAPSULE | Freq: Every day | ORAL | 1 refills | Status: DC

## 2019-09-17 MED ORDER — FUROSEMIDE 40 MG PO TABS: 40.0000 mg | ORAL_TABLET | Freq: Two times a day (BID) | ORAL | 0 refills | Status: DC

## 2019-09-17 NOTE — Telephone Encounter (Signed)
Medication: potassium chloride SA (K-DUR,KLOR-CON) 20 MEQ tablet [id number] , furosemide (LASIX) 40 MG tablet [id number] , omeprazole (PRILOSEC) 20 MG capsule [id number]   Has the patient contacted their pharmacy? Yes  (Agent: If no, request that the patient contact the pharmacy for the refill.) (Agent: If yes, when and what did the pharmacy advise?)  Preferred Pharmacy (with phone number or street name): [facility] - [place], [place] - [address] (Phone) [phone] (Fax)    Agent: Please be advised that RX refills may take up to 3 business days. We ask that you follow-up with your pharmacy.

## 2019-09-20 DIAGNOSIS — F411 Generalized anxiety disorder: Secondary | ICD-10-CM | POA: Diagnosis not present

## 2019-09-20 DIAGNOSIS — F332 Major depressive disorder, recurrent severe without psychotic features: Secondary | ICD-10-CM | POA: Diagnosis not present

## 2019-09-20 DIAGNOSIS — F410 Panic disorder [episodic paroxysmal anxiety] without agoraphobia: Secondary | ICD-10-CM | POA: Diagnosis not present

## 2019-09-24 ENCOUNTER — Ambulatory Visit
Admission: RE | Admit: 2019-09-24 | Discharge: 2019-09-24 | Disposition: A | Discharge status: Home / Self Care | Payer: Medicare Other | Source: Ambulatory Visit | Attending: Family Medicine | Admitting: Family Medicine

## 2019-09-24 DIAGNOSIS — M5432 Sciatica, left side: Secondary | ICD-10-CM

## 2019-09-24 MED ORDER — IOPAMIDOL (ISOVUE-M 300) INJECTION 61%
1.0000 mL | Freq: Once | INTRAMUSCULAR | Status: AC | PRN
  Administered 2019-09-24: 1 mL via EPIDURAL

## 2019-09-24 MED ORDER — TRIAMCINOLONE ACETONIDE 40 MG/ML IJ SUSP (RADIOLOGY)
60.0000 mg | Freq: Once | INTRAMUSCULAR | Status: AC
  Administered 2019-09-24: 13:00:00 60 mg via EPIDURAL

## 2019-09-24 NOTE — Discharge Instructions (Signed)

## 2019-09-27 ENCOUNTER — Ambulatory Visit (INDEPENDENT_AMBULATORY_CARE_PROVIDER_SITE_OTHER): Payer: Medicare Other | Admitting: Licensed Clinical Social Worker

## 2019-09-27 ENCOUNTER — Other Ambulatory Visit: Payer: Self-pay

## 2019-09-27 DIAGNOSIS — F411 Generalized anxiety disorder: Secondary | ICD-10-CM | POA: Diagnosis not present

## 2019-09-27 DIAGNOSIS — F331 Major depressive disorder, recurrent, moderate: Secondary | ICD-10-CM
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
## 2019-10-04 ENCOUNTER — Encounter: Payer: Self-pay | Admitting: Gastroenterology

## 2019-10-04 ENCOUNTER — Ambulatory Visit (INDEPENDENT_AMBULATORY_CARE_PROVIDER_SITE_OTHER): Payer: Medicare Other | Admitting: Gastroenterology

## 2019-10-04 ENCOUNTER — Other Ambulatory Visit: Payer: Self-pay

## 2019-10-04 VITALS — BP 126/76 | HR 76 | Temp 97.2°F | Ht 66.0 in | Wt 252.0 lb

## 2019-10-04 DIAGNOSIS — K5909 Other constipation: Secondary | ICD-10-CM

## 2019-10-04 NOTE — Patient Instructions (Signed)
Please use a daily stool bulking agent such as Metamucil twice daily.  Continue your Linzess.  I am recommending an x-ray test to further evaluate your constipation.   A recent study showed that eating two kiwi every day is as good as medical therapy for treating constipation. You may find that some foods improve your constipation.   Toileting tips to help with your constipation  - Establish a time to try to move your bowels every day. For many people, this is after a cup of coffee.  - Sit all of the way back on the toilet keeping your back fairly straight and lean forward bending from your hips, resting your forearms on your thighs. Raising your feet with a step stool can be helpful.  - Relax the rectum feeling it bulge toward the toilet water. If you feel your rectum raising toward your body, you are contracting rather than relaxing.  - Breathe in and slowly exhale. "Belly breath" by expanding your belly towards your belly button. Keep belly expanded as you gently direct pressure down and back to the anus. A low pitched GRRR sound can assist with increasing intra-abdominal pressure.   - Repeat 3-4 times. If unsuccessful, contract the pelvic floor to restore normal tone and get off the toilet. Avoid excessive straining.  - To reduce excessive wiping by teaching your anus to normally contract, place hands on outer aspect of knees and resist knee movement outward. Hold 5-10 second then place hands just inside of knees and resist inward movement of knees. Hold 5 seconds. Repeat a few times each way.

## 2019-10-04 NOTE — Progress Notes (Signed)
Referring Provider: Biagio Borg, MD Primary Care Physician:  [name], MD  Reason for Consultation:  Constipation   IMPRESSION:  Chronic constipation  Constipation not responding to laxative therapy without alarm features.  Likely defecatory disorder, normal transit constipation, or slow transit constipation. Differential for constipation also includes disordered colonic and/or pelvic floor/anorectal function.  Colonic disease (stricture, cancer, anal fissure, proctitis) seems less likely given her history.  No obvious metabolic disturbances.  Neurologic etiologies not suspected.  PLAN: Sitz mark study Use a stool bulking agent such as Metamucil twice daily Continue to use the Linzess 290 mcg daily Drink plenty of water Follow-up in 4-6 weeks  Please see the "Patient Instructions" section for addition details about the plan.  HPI: Erica [name] is a 63 y.o. female referred by Dr. Quay Burow for bloating and crampy abdominal pain associated with constipation.  The history is obtained to the patient and review of her electronic health record.  She was previously seen by Dr. Olevia Perches, most recently in 2015.  The patient reports a "big change" in her bowel habits.   She had a screening colonoscopy in 2005 and in 2015.  The chart mentions a family history of colon cancer in a cousin.  She has a bowel movement once weekly.  Drinks water, and frequently uses bisacodyl, Linzess, magnesium citrate Previously tried MiraLAX, milk of magnesia, Dulcolax, and herbal laxatives without satisfactory results She finds that the Linzess is not working, even with a change to the higher dose. Feels ongoing upper abdominal bloating. Feels like her abdomen is hard.  There is a mild diffuse abdominal cramping but progresses with her constipation. Feels full. No appetite. Gaining weight.  Sense of incomplete evacuation. Straining.  Lumpy hard stools No manual assistance with defecation.  No  sensation of anal rectal obstruction or blockage No blood or mucous in the stool.  No other associated symptoms. No identified exacerbating or relieving features.    TSH normal 05/11/19 2.717 Serum calcium normal 08/23/19 9.8  Abdominal films 08/23/19: normal  Prior endoscopy: Normal screening colonoscopy 04/20/2004 Normal colonoscopy 05/08/2014 Screening colonoscopy recommended in 2025 Husband with polyps. Cousin with colon polyps. No known family history of colon cancer or polyps. No family history of uterine/endometrial cancer, pancreatic cancer or gastric/stomach cancer.   Past Medical History:  Diagnosis Date  . Abnormal stress test    a. 09/2016: NST showed a small defect of mild severity present in the basal inferoseptal and mid inferoseptal location, consistent with ischemia. --> medically managed  . ALLERGIC RHINITIS   . Anxiety   . Bursitis   . DDD (degenerative disc disease), lumbar    ESI with Ramos (spring 2016)  . Depression   . Diabetes mellitus without complication ([name])   . Dyslipidemia   . External hemorrhoids   . GERD (gastroesophageal reflux disease)   . Hypertension   . Obesity   . Osteoarthritis   . Palpitations    a. prior event monitor showing sinus tachycardia, no PAF.   Marland Kitchen Panic attacks    Hx of depression  . Sleep apnea    CPAP hs    Past Surgical History:  Procedure Laterality Date  . ABDOMINAL HYSTERECTOMY    . MASS EXCISION Left 12/30/2015   Procedure: EXCISION LEFT LEG MASS;  Surgeon: Donnie Mesa, MD;  Location: [place];  Service: General;  Laterality: Left;  . REPLACEMENT TOTAL KNEE Right   . RIGHT OOPHORECTOMY     No cancer  Current Outpatient Medications  Medication Sig Dispense Refill  . albuterol (VENTOLIN HFA) 108 (90 Base) MCG/ACT inhaler Inhale 2 puffs into the lungs every 6 (six) hours as needed for wheezing or shortness of breath. 3 Inhaler 3  . ALPRAZolam (XANAX) 1 MG tablet Take 1 mg by mouth as needed  for anxiety.    Marland Kitchen atenolol (TENORMIN) 25 MG tablet Take 71m (1 tablet) in the AM and 526m(2 tablets) in the PM 270 tablet 1  . baclofen (LIORESAL) 10 MG tablet TAKE ONE TABLET BY MOUTH TWICE A DAY AS NEEDED FOR MUSCLE SPASMS 30 each 0  . blood glucose meter kit and supplies KIT Dispense based on patient and insurance preference. Use up to four times daily as directed. (FOR E11.9). 1 each 0  . fluticasone (FLONASE) 50 MCG/ACT nasal spray Place 2 sprays into both nostrils daily. 48 g 11  . furosemide (LASIX) 40 MG tablet Take 1 tablet (40 mg total) by mouth 2 (two) times daily. 120 tablet 0  . gabapentin (NEURONTIN) 100 MG capsule Take 1 capsule (100 mg total) by mouth 3 (three) times daily. 90 capsule 1  . hydrocortisone (ANUSOL-HC) 2.5 % rectal cream Place rectally as needed. 30 g 3  . hydrOXYzine (ATARAX/VISTARIL) 10 MG tablet Take 1 tablet (10 mg total) by mouth 3 (three) times daily as needed. 30 tablet 0  . levocetirizine (XYZAL) 5 MG tablet Take 1 tablet (5 mg total) by mouth every evening. 90 tablet 1  . linaclotide (LINZESS) 290 MCG CAPS capsule Take 1 capsule (290 mcg total) by mouth daily before breakfast. 90 capsule 1  . metFORMIN (GLUCOPHAGE) 500 MG tablet Take 1 tablet (500 mg total) by mouth daily with breakfast. 90 tablet 1  . Multiple Vitamins-Minerals (MULTIVITAMIN WITH MINERALS) tablet Take 1 tablet by mouth daily.    . Marland Kitchenmeprazole (PRILOSEC) 20 MG capsule Take 1 capsule (20 mg total) by mouth daily. 90 capsule 1  . ondansetron (ZOFRAN) 4 MG tablet Take 1 tablet (4 mg total) by mouth every 8 (eight) hours as needed for nausea or vomiting. 40 tablet 0  . potassium chloride SA (KLOR-CON) 20 MEQ tablet Take 1 tablet (20 mEq total) by mouth 2 (two) times daily. 180 tablet 1  . simvastatin (ZOCOR) 40 MG tablet Take 1 tablet (40 mg total) by mouth daily at 6 PM. 90 tablet 1  . SUMAtriptan-naproxen (TREXIMET) 85-500 MG tablet Take 1 tablet by mouth every 2 (two) hours as needed for  migraine. 10 tablet 8  . venlafaxine XR (EFFEXOR-XR) 150 MG 24 hr capsule Take 150 mg by mouth 2 (two) times daily.    . Marland KitchenITAMIN D PO Take 1 tablet by mouth daily.    . Vitamin D, Ergocalciferol, (DRISDOL) 1.25 MG (50000 UT) CAPS capsule Take 1 capsule (50,000 Units total) by mouth every 7 (seven) days. 4 capsule 0  . diclofenac (VOLTAREN) 75 MG EC tablet Take 1 tablet (75 mg total) by mouth 2 (two) times daily. (Patient not taking: Reported on 10/04/2019) 60 tablet 1  . NONFORMULARY OR COMPOUNDED ITEM [facility]:  Antifungal - Terbinafine 3%, Fluconazole 2%, Tea Tree Oin 5%, Urea 10%, Ibuprofen 2% in DMSO #3045mApply to the affected nail(s) once (at bedtime) or twice daily. (Patient not taking: Reported on 10/04/2019) 30 each 5   No current facility-administered medications for this visit.    Allergies as of 10/04/2019 - Review Complete 10/04/2019  Allergen Reaction Noted  . Seroquel [quetiapine fumarate]  05/18/2019  .  Codeine Nausea And Vomiting 01/23/2014  . Guaifenesin-codeine Other (See Comments) 01/12/2008  . Wellbutrin [bupropion] Palpitations 09/20/2011    Family History  Problem Relation Age of Onset  . Heart disease Mother        Enlarged Heart, Pacemaker  . Diabetes Mother   . Thyroid disease Mother   . Hyperlipidemia Mother   . Hypertension Mother   . Sleep apnea Mother   . Dementia Father   . Kidney failure Father   . Prostate cancer Father   . Alcoholism Father   . Asthma Other   . Allergies Sister   . Asthma Son   . Breast cancer Maternal Aunt        cousin  . Colon cancer Cousin   . Heart disease Son        CHF, morbid obesity  . Prostate cancer Maternal Uncle   . Diabetes Son     Social History   Socioeconomic History  . Marital status: Married    Spouse name: [name]  . Number of children: 3  . Years of education: Not on file  . Highest education level: Not on file  Occupational History  . Occupation: Disabled  Tobacco Use  . Smoking  status: Never Smoker  . Smokeless tobacco: Never Used  . Tobacco comment: tried to as a teenager  Substance and Sexual Activity  . Alcohol use: Yes    Alcohol/week: 1.0 standard drinks    Types: 1 Glasses of wine per week    Comment: one drink a month  . Drug use: No  . Sexual activity: Not Currently  Other Topics Concern  . Not on file  Social History Narrative   Married with children. Pt is on disablity. Previous worked in the school system   Social Determinants of Radio broadcast assistant Strain:   . Difficulty of Paying Living Expenses: Not on file  Food Insecurity:   . Worried About Charity fundraiser in the Last Year: Not on file  . Ran Out of Food in the Last Year: Not on file  Transportation Needs:   . Lack of Transportation (Medical): Not on file  . Lack of Transportation (Non-Medical): Not on file  Physical Activity:   . Days of Exercise per Week: Not on file  . Minutes of Exercise per Session: Not on file  Stress:   . Feeling of Stress : Not on file  Social Connections:   . Frequency of Communication with Friends and Family: Not on file  . Frequency of Social Gatherings with Friends and Family: Not on file  . Attends Religious Services: Not on file  . Active Member of Clubs or Organizations: Not on file  . Attends Archivist Meetings: Not on file  . Marital Status: Not on file  Intimate Partner Violence:   . Fear of Current or Ex-Partner: Not on file  . Emotionally Abused: Not on file  . Physically Abused: Not on file  . Sexually Abused: Not on file    Review of Systems: 12 system ROS is negative except as noted above with the additions of anxiety, arthritis, vision changes, depression, night sweats.   Physical Exam: General:   Alert,  well-nourished, pleasant and cooperative in NAD Head:  Normocephalic and atraumatic. Eyes:  Sclera clear, no icterus.   Conjunctiva pink. Ears:  Normal auditory acuity. Nose:  No deformity, discharge,  or  lesions. Mouth:  No deformity or lesions.   Neck:  Supple; no masses or  thyromegaly. Lungs:  Clear throughout to auscultation.   No wheezes. Heart:  Regular rate and rhythm; no murmurs. Abdomen:  Soft, mild central obesity, nontender, nondistended, normal bowel sounds, no rebound or guarding. No hepatosplenomegaly.   Rectal:  Deferred  Msk:  Symmetrical. No boney deformities LAD: No inguinal or umbilical LAD Extremities:  No clubbing or edema. Neurologic:  Alert and  oriented x4;  grossly nonfocal Skin:  Intact without significant lesions or rashes. Psych:  Alert and cooperative. Normal mood and affect.     Leelah Hanna L. Tarri Glenn, MD, MPH 10/04/2019, 9:29 AM

## 2019-10-09 ENCOUNTER — Other Ambulatory Visit: Payer: Self-pay

## 2019-10-09 ENCOUNTER — Ambulatory Visit (INDEPENDENT_AMBULATORY_CARE_PROVIDER_SITE_OTHER)
Admission: RE | Admit: 2019-10-09 | Discharge: 2019-10-09 | Disposition: A | Discharge status: Home / Self Care | Payer: Medicare Other | Source: Ambulatory Visit | Attending: Gastroenterology | Admitting: Gastroenterology

## 2019-10-09 DIAGNOSIS — K5909 Other constipation: Secondary | ICD-10-CM | POA: Diagnosis not present

## 2019-10-29 ENCOUNTER — Other Ambulatory Visit: Payer: Self-pay

## 2019-10-29 ENCOUNTER — Ambulatory Visit (INDEPENDENT_AMBULATORY_CARE_PROVIDER_SITE_OTHER): Payer: Medicare Other | Admitting: Licensed Clinical Social Worker

## 2019-10-29 DIAGNOSIS — F331 Major depressive disorder, recurrent, moderate: Secondary | ICD-10-CM

## 2019-10-29 DIAGNOSIS — F411 Generalized anxiety disorder: Secondary | ICD-10-CM

## 2019-10-29 NOTE — Progress Notes (Signed)
Virtual Visit via Video Note   I connected with Erica [name] on 10/29/19 at 11:00am by [facility] video application and verified that I am speaking with the correct person using two identifiers.   I discussed the limitations, risks, security and privacy concerns of performing an evaluation and management service by telephone and the availability of in person appointments. I also discussed with the patient that there may be a patient responsible charge related to this service. The patient expressed understanding and agreed to proceed.   I discussed the assessment and treatment plan with the patient. The patient was provided an opportunity to ask questions and all were answered. The patient agreed with the plan and demonstrated an understanding of the instructions.   The patient was advised to call back or seek an in-person evaluation if the symptoms worsen or if the condition fails to improve as anticipated.   I provided 30 minutes of non-face-to-face time during this encounter.     Shade Flood, LCSW, LCASA _________________________________ THERAPIST PROGRESS NOTE  Session Time: 11:00am - 11:30am  Participation Level: Active   Behavioral Response: Alert, casually dressed, depressed mood/affect  Type of Therapy:  Individual Therapy  Treatment Goals addressed: Anxiety/Depression management; medication management; Exercise; Spiritual and family support; Grieving    Interventions: CBT, mindfulness   Summary: Erica [name] met with clinician for virtual therapy session today via Webex.  Erica [name] reported scores of 5/10 for depression and 1/10 for anxiety, and stated "I've been doing alright I guess".  Erica [name] reported that she has continued to take her medication daily, has considered looking into water aerobics again for exercise, continues speaking with her sisters, and children weekly for support, prays daily and attends virtual church sermons x2 weekly.  Erica [name] reported that she is  still grieving her deceased family members at this time, but is trying to focus on the positive memories that she had with them now, instead of ruminating on their passing.  Erica [name] reported that one area she is currently struggling with is anxiety regarding recent violence in [place], stating "I've been more jittery lately and worry about people while I'm out in public".  Erica [name] reported that she tries to pray when feeling anxious, but would like additional coping skills.  She was agreeable to trying mindful breathing, and participated in exercise, stating "I do feel more calm".  She agreed to try this out each day and report back on progress in 2 weeks.      Suicidal/Homicidal: None, without plan or intent.    Therapist Response: Clinician met with Erica [name] for virtual session today.  Clinician assessed for safety.  Clinician inquired about Erica [name]'s present emotional ratings at this time, as well as any significant changes in thoughts, feelings or behavior since last conversation.  Clinician revisited treatment plan with Erica [name] to identify where progress is being made, as well as areas that need consideration.  Clinician empathized with Campbell's anxiety at this time regarding world events, and encouraged her to openly discuss what about recent news has her concerned, and how she is dealing with this anxiety trigger.  Clinician offered to demonstrate mindful breathing technique to [facility] regarding using breathing to ground oneself, and avoid allowing thoughts and feelings to destabilize mood.  Clinician guided Erica [name] through exercise, and inquired about effectiveness afterward.  Clinician will continue to monitor.    Plan: Follow up again in 1 week virtually.    Diagnosis: Major Depressive Disorder, Recurrent, moderate without psychotic features and generalized anxiety disorder  Shade Flood,  LCSW, LCASA 10/29/19

## 2019-11-15 ENCOUNTER — Encounter: Payer: Self-pay | Admitting: Family Medicine

## 2019-11-19 NOTE — Patient Instructions (Addendum)
  Blood work was ordered.     Medications reviewed and updated.  Changes include :   None  Your prescription(s) have been submitted to your pharmacy. Please take as directed and contact our office if you believe you are having problem(s) with the medication(s).  A referral was ordered for surgery   Please followup in 6 months

## 2019-11-19 NOTE — Progress Notes (Signed)
Subjective:    Patient ID: Erica [name], female    DOB: 1956/05/22, 64 y.o.   MRN: [id number]  HPI The patient is here for follow up of their chronic medical problems, including hypertension, diabetes, hyperlipidemia, migraines, gerd, constipation.  She is taking all of her medications as prescribed.   She is not exercising regularly.  She can not get into the water because of covid.    She has gained weight and she is very unhappy with her weight.  She states she has never weighed this much.  She wonders if there is something I could give her that would stop the craving for sugar.    Medications and allergies reviewed with patient and updated if appropriate.  Patient Active Problem List   Diagnosis Date Noted  . Chronic constipation 08/23/2019  . Abdominal pain 08/23/2019  . Bilateral carotid artery stenosis 06/04/2019  . Osteopenia 05/19/2019  . Visual hallucinations 05/17/2019  . Sinus pressure 02/12/2019  . Acute pain of left knee 12/11/2018  . Class 2 severe obesity with serious comorbidity and body mass index (BMI) of 37.0 to 37.9 in adult ([place]) 08/31/2018  . Degeneration of lumbar intervertebral disc 05/30/2018  . Onychomycosis 12/07/2017  . Nausea without vomiting 12/07/2017  . Left hip pain 06/12/2017  . Acute left-sided low back pain without sciatica 06/12/2017  . Meningioma ([place]) 03/28/2017  . Panic attacks 09/27/2016  . GERD (gastroesophageal reflux disease) 09/27/2016  . Grief reaction 08/23/2016  . Right ankle pain 08/19/2016  . Diabetes ([place]) 02/15/2016  . Lump of skin 11/28/2015  . Migraine without aura and with status migrainosus, not intractable 10/03/2015  . Numbness of left hand 10/03/2015  . Cephalalgia 10/03/2015  . Abnormal ECG 09/11/2012  . Palpitations 09/08/2012  . Sciatica of left side   . Peripheral edema 05/09/2012  . FATIGUE 02/23/2010  . Obstructive sleep apnea 10/31/2009  . External hemorrhoids 10/17/2009  . CONSTIPATION,  CHRONIC 10/17/2009  . Hyperlipidemia 01/12/2008  . Depression, major, recurrent ([place]) 01/12/2008  . Essential hypertension, benign 01/12/2008  . Allergic rhinitis 01/12/2008    Current Outpatient Medications on File Prior to Visit  Medication Sig Dispense Refill  . ALPRAZolam (XANAX) 1 MG tablet Take 1 mg by mouth as needed for anxiety.    . blood glucose meter kit and supplies KIT Dispense based on patient and insurance preference. Use up to four times daily as directed. (FOR E11.9). 1 each 0  . gabapentin (NEURONTIN) 100 MG capsule Take 1 capsule (100 mg total) by mouth 3 (three) times daily. 90 capsule 1  . hydrocortisone (ANUSOL-HC) 2.5 % rectal cream Place rectally as needed. 30 g 3  . hydrOXYzine (ATARAX/VISTARIL) 10 MG tablet Take 1 tablet (10 mg total) by mouth 3 (three) times daily as needed. 30 tablet 0  . Multiple Vitamins-Minerals (MULTIVITAMIN WITH MINERALS) tablet Take 1 tablet by mouth daily.    . NONFORMULARY OR COMPOUNDED ITEM [facility]:  Antifungal - Terbinafine 3%, Fluconazole 2%, Tea Tree Oin 5%, Urea 10%, Ibuprofen 2% in DMSO #95m. Apply to the affected nail(s) once (at bedtime) or twice daily. 30 each 5  . SUMAtriptan-naproxen (TREXIMET) 85-500 MG tablet Take 1 tablet by mouth every 2 (two) hours as needed for migraine. 10 tablet 8  . venlafaxine XR (EFFEXOR-XR) 150 MG 24 hr capsule Take 150 mg by mouth 2 (two) times daily.    .Marland KitchenVITAMIN D PO Take 1 tablet by mouth daily.    . Vitamin D, Ergocalciferol, (  DRISDOL) 1.25 MG (50000 UT) CAPS capsule Take 1 capsule (50,000 Units total) by mouth every 7 (seven) days. 4 capsule 0  . [DISCONTINUED] oxycodone (OXY-IR) 5 MG capsule Take 5 mg by mouth every 4 (four) hours as needed. For pain.     No current facility-administered medications on file prior to visit.    Past Medical History:  Diagnosis Date  . Abnormal stress test    a. 09/2016: NST showed a small defect of mild severity present in the basal inferoseptal  and mid inferoseptal location, consistent with ischemia. --> medically managed  . ALLERGIC RHINITIS   . Anxiety   . Bursitis   . DDD (degenerative disc disease), lumbar    ESI with Ramos (spring 2016)  . Depression   . Diabetes mellitus without complication ([place])   . Dyslipidemia   . External hemorrhoids   . GERD (gastroesophageal reflux disease)   . Hypertension   . Obesity   . Osteoarthritis   . Palpitations    a. prior event monitor showing sinus tachycardia, no PAF.   Marland Kitchen Panic attacks    Hx of depression  . Sleep apnea    CPAP hs    Past Surgical History:  Procedure Laterality Date  . ABDOMINAL HYSTERECTOMY    . MASS EXCISION Left 12/30/2015   Procedure: EXCISION LEFT LEG MASS;  Surgeon: Donnie Mesa, MD;  Location: [place];  Service: General;  Laterality: Left;  . REPLACEMENT TOTAL KNEE Right   . RIGHT OOPHORECTOMY     No cancer    Social History   Socioeconomic History  . Marital status: Married    Spouse name: [name]  . Number of children: 3  . Years of education: Not on file  . Highest education level: Not on file  Occupational History  . Occupation: Disabled  Tobacco Use  . Smoking status: Never Smoker  . Smokeless tobacco: Never Used  . Tobacco comment: tried to as a teenager  Substance and Sexual Activity  . Alcohol use: Yes    Alcohol/week: 1.0 standard drinks    Types: 1 Glasses of wine per week    Comment: one drink a month  . Drug use: No  . Sexual activity: Not Currently  Other Topics Concern  . Not on file  Social History Narrative   Married with children. Pt is on disablity. Previous worked in the school system   Social Determinants of Radio broadcast assistant Strain:   . Difficulty of Paying Living Expenses: Not on file  Food Insecurity:   . Worried About Charity fundraiser in the Last Year: Not on file  . Ran Out of Food in the Last Year: Not on file  Transportation Needs:   . Lack of Transportation (Medical):  Not on file  . Lack of Transportation (Non-Medical): Not on file  Physical Activity:   . Days of Exercise per Week: Not on file  . Minutes of Exercise per Session: Not on file  Stress:   . Feeling of Stress : Not on file  Social Connections:   . Frequency of Communication with Friends and Family: Not on file  . Frequency of Social Gatherings with Friends and Family: Not on file  . Attends Religious Services: Not on file  . Active Member of Clubs or Organizations: Not on file  . Attends Archivist Meetings: Not on file  . Marital Status: Not on file    Family History  Problem Relation Age of  Onset  . Heart disease Mother        Enlarged Heart, Pacemaker  . Diabetes Mother   . Thyroid disease Mother   . Hyperlipidemia Mother   . Hypertension Mother   . Sleep apnea Mother   . Dementia Father   . Kidney failure Father   . Prostate cancer Father   . Alcoholism Father   . Asthma Other   . Allergies Sister   . Asthma Son   . Breast cancer Maternal Aunt        cousin  . Colon cancer Cousin   . Heart disease Son        CHF, morbid obesity  . Prostate cancer Maternal Uncle   . Diabetes Son     Review of Systems  Constitutional: Negative for chills and fever.  Respiratory: Negative for cough, shortness of breath and wheezing.   Cardiovascular: Negative for chest pain, palpitations and leg swelling.  Musculoskeletal: Positive for arthralgias (knees).  Neurological: Positive for light-headedness (occ). Negative for dizziness and headaches.       Objective:   Vitals:   11/20/19 0942  BP: 124/82  Pulse: 71  Resp: 16  Temp: 98.6 F (37 C)  SpO2: 96%   BP Readings from Last 3 Encounters:  11/20/19 124/82  10/04/19 126/76  09/24/19 (!) 151/95   Wt Readings from Last 3 Encounters:  11/20/19 247 lb (112 kg)  10/04/19 252 lb (114.3 kg)  09/14/19 252 lb (114.3 kg)   Body mass index is 39.87 kg/m.   Physical Exam    Constitutional: Appears well-developed  and well-nourished. No distress.  HENT:  Head: Normocephalic and atraumatic.  Neck: Neck supple. No tracheal deviation present. No thyromegaly present.  No cervical lymphadenopathy Cardiovascular: Normal rate, regular rhythm and normal heart sounds.  No murmur heard. No carotid bruit .  No edema Pulmonary/Chest: Effort normal and breath sounds normal. No respiratory distress. No has no wheezes. No rales.  Skin: Skin is warm and dry. Not diaphoretic.  Psychiatric: Normal mood and affect. Behavior is normal.      Assessment & Plan:    See Problem List for Assessment and Plan of chronic medical problems.    This visit occurred during the SARS-CoV-2 public health emergency.  Safety protocols were in place, including screening questions prior to the visit, additional usage of staff PPE, and extensive cleaning of exam room while observing appropriate contact time as indicated for disinfecting solutions.

## 2019-11-20 ENCOUNTER — Other Ambulatory Visit: Payer: Self-pay

## 2019-11-20 ENCOUNTER — Encounter: Payer: Self-pay | Admitting: Internal Medicine

## 2019-11-20 ENCOUNTER — Ambulatory Visit (INDEPENDENT_AMBULATORY_CARE_PROVIDER_SITE_OTHER): Payer: Medicare Other | Admitting: Internal Medicine

## 2019-11-20 VITALS — BP 124/82 | HR 71 | Temp 98.6°F | Resp 16 | Ht 66.0 in | Wt 247.0 lb

## 2019-11-20 DIAGNOSIS — K219 Gastro-esophageal reflux disease without esophagitis: Secondary | ICD-10-CM | POA: Diagnosis not present

## 2019-11-20 DIAGNOSIS — E119 Type 2 diabetes mellitus without complications: Secondary | ICD-10-CM | POA: Diagnosis not present

## 2019-11-20 DIAGNOSIS — M5432 Sciatica, left side: Secondary | ICD-10-CM

## 2019-11-20 DIAGNOSIS — E7849 Other hyperlipidemia: Secondary | ICD-10-CM | POA: Diagnosis not present

## 2019-11-20 DIAGNOSIS — J301 Allergic rhinitis due to pollen: Secondary | ICD-10-CM

## 2019-11-20 DIAGNOSIS — K5909 Other constipation: Secondary | ICD-10-CM

## 2019-11-20 DIAGNOSIS — J069 Acute upper respiratory infection, unspecified: Secondary | ICD-10-CM

## 2019-11-20 DIAGNOSIS — I10 Essential (primary) hypertension: Secondary | ICD-10-CM | POA: Diagnosis not present

## 2019-11-20 DIAGNOSIS — Z6837 Body mass index (BMI) 37.0-37.9, adult: Secondary | ICD-10-CM

## 2019-11-20 DIAGNOSIS — G43001 Migraine without aura, not intractable, with status migrainosus: Secondary | ICD-10-CM | POA: Diagnosis not present

## 2019-11-20 LAB — COMPREHENSIVE METABOLIC PANEL
ALT: 12 U/L (ref 0–35)
AST: 14 U/L (ref 0–37)
Albumin: 4 g/dL (ref 3.5–5.2)
Alkaline Phosphatase: 157 U/L — ABNORMAL HIGH (ref 39–117)
BUN: 20 mg/dL (ref 6–23)
CO2: 29 mEq/L (ref 19–32)
Calcium: 9.6 mg/dL (ref 8.4–10.5)
Chloride: 103 mEq/L (ref 96–112)
Creatinine, Ser: 0.73 mg/dL (ref 0.40–1.20)
GFR: 96.52 mL/min (ref >60.00)
Glucose, Bld: 111 mg/dL — ABNORMAL HIGH (ref 70–99)
Potassium: 3.9 mEq/L (ref 3.5–5.1)
Sodium: 137 mEq/L (ref 135–145)
Total Bilirubin: 0.2 mg/dL (ref 0.2–1.2)
Total Protein: 7.3 g/dL (ref 6.0–8.3)

## 2019-11-20 LAB — LIPID PANEL
Cholesterol: 166 mg/dL (ref 0–200)
HDL: 50.6 mg/dL (ref >39.00)
LDL Cholesterol: 95 mg/dL (ref 0–99)
NonHDL: 115.21
Total CHOL/HDL Ratio: 3
Triglycerides: 101 mg/dL (ref 0.0–149.0)
VLDL: 20.2 mg/dL (ref 0.0–40.0)

## 2019-11-20 LAB — HEMOGLOBIN A1C: Hgb A1c MFr Bld: 6.4 % (ref 4.6–6.5)

## 2019-11-20 MED ORDER — FLUTICASONE PROPIONATE 50 MCG/ACT NA SUSP: 2.0000 | Freq: Every day | NASAL | 2 refills | Status: DC

## 2019-11-20 MED ORDER — ALBUTEROL SULFATE HFA 108 (90 BASE) MCG/ACT IN AERS: 2.0000 | INHALATION_SPRAY | Freq: Four times a day (QID) | RESPIRATORY_TRACT | 3 refills | Status: DC | PRN

## 2019-11-20 MED ORDER — LEVOCETIRIZINE DIHYDROCHLORIDE 5 MG PO TABS: 5.0000 mg | ORAL_TABLET | Freq: Every evening | ORAL | 1 refills | Status: DC

## 2019-11-20 MED ORDER — LINACLOTIDE 290 MCG PO CAPS: 290.0000 ug | ORAL_CAPSULE | Freq: Every day | ORAL | 1 refills | Status: DC

## 2019-11-20 MED ORDER — ATENOLOL 25 MG PO TABS: ORAL_TABLET | ORAL | 1 refills | Status: DC

## 2019-11-20 MED ORDER — SIMVASTATIN 40 MG PO TABS: 40.0000 mg | ORAL_TABLET | Freq: Every day | ORAL | 1 refills | Status: DC

## 2019-11-20 MED ORDER — METFORMIN HCL 500 MG PO TABS: 500.0000 mg | ORAL_TABLET | Freq: Every day | ORAL | 1 refills | Status: DC

## 2019-11-20 MED ORDER — BACLOFEN 10 MG PO TABS: ORAL_TABLET | ORAL | 0 refills | Status: DC

## 2019-11-20 MED ORDER — ONDANSETRON HCL 4 MG PO TABS: 4.0000 mg | ORAL_TABLET | Freq: Three times a day (TID) | ORAL | 0 refills | Status: DC | PRN

## 2019-11-20 MED ORDER — FUROSEMIDE 40 MG PO TABS: 40.0000 mg | ORAL_TABLET | Freq: Two times a day (BID) | ORAL | 1 refills | Status: DC

## 2019-11-20 MED ORDER — POTASSIUM CHLORIDE CRYS ER 20 MEQ PO TBCR: 20.0000 meq | EXTENDED_RELEASE_TABLET | Freq: Two times a day (BID) | ORAL | 1 refills | Status: DC

## 2019-11-20 MED ORDER — OMEPRAZOLE 20 MG PO CPDR: 20.0000 mg | DELAYED_RELEASE_CAPSULE | Freq: Every day | ORAL | 1 refills | Status: DC

## 2019-11-20 NOTE — Assessment & Plan Note (Signed)
Chronic Sugars have been well controlled with Metformin 500 mg daily Check A1c Stressed diabetic diet-low sugar diet to help with weight loss Encouraged regular exercise

## 2019-11-20 NOTE — Assessment & Plan Note (Signed)
Chronic Taking Linzess daily and that is helping Continue

## 2019-11-20 NOTE — Assessment & Plan Note (Signed)
Chronic Check lipid panel  Continue daily statin Regular exercise and healthy diet encouraged  

## 2019-11-20 NOTE — Assessment & Plan Note (Signed)
Chronic GERD controlled Continue daily medication  

## 2019-11-20 NOTE — Assessment & Plan Note (Signed)
Chronic Continue baclofen twice daily as needed

## 2019-11-20 NOTE — Assessment & Plan Note (Signed)
Chronic BP well controlled Current regimen effective and well tolerated Continue current medications at current doses cmp  

## 2019-11-20 NOTE — Assessment & Plan Note (Signed)
Chronic Controlled Continue Xyzal, Flonase

## 2019-11-20 NOTE — Assessment & Plan Note (Signed)
Chronic Maybe once a month Takes treximet as needed, which is effective continue

## 2019-11-20 NOTE — Assessment & Plan Note (Addendum)
Chronic With co morbidities of diabetes, hypertension, hyperlipidemia, sleep apnea She wants to lose weight and discussed that she would benefit greatly from weight loss Discussed healthy weight and wellness clinic Discussed that there is no medication I can give her that we will stop her cravings for sugar and she just needs to stop eating them, which will help the most with the cravings Encouraged regular exercise, healthy diet decrease portions She is interested in bariatric surgery-gastric sleeve.  Referral ordered for bariatric surgery.  Discussed that this only works if she makes changes in her lifestyle, which she understands

## 2019-11-27 ENCOUNTER — Encounter: Payer: Self-pay | Admitting: Internal Medicine

## 2019-11-29 ENCOUNTER — Ambulatory Visit: Payer: Medicare Other | Admitting: Family Medicine

## 2019-12-03 DIAGNOSIS — N952 Postmenopausal atrophic vaginitis: Secondary | ICD-10-CM | POA: Diagnosis not present

## 2019-12-03 DIAGNOSIS — Z124 Encounter for screening for malignant neoplasm of cervix: Secondary | ICD-10-CM | POA: Diagnosis not present

## 2019-12-03 DIAGNOSIS — N760 Acute vaginitis: Secondary | ICD-10-CM | POA: Diagnosis not present

## 2019-12-03 DIAGNOSIS — Z1231 Encounter for screening mammogram for malignant neoplasm of breast: Secondary | ICD-10-CM | POA: Diagnosis not present

## 2019-12-07 ENCOUNTER — Ambulatory Visit (INDEPENDENT_AMBULATORY_CARE_PROVIDER_SITE_OTHER): Payer: Medicare Other | Admitting: Family Medicine

## 2019-12-07 ENCOUNTER — Encounter: Payer: Self-pay | Admitting: Family Medicine

## 2019-12-07 ENCOUNTER — Ambulatory Visit (INDEPENDENT_AMBULATORY_CARE_PROVIDER_SITE_OTHER): Payer: Medicare Other

## 2019-12-07 ENCOUNTER — Other Ambulatory Visit: Payer: Self-pay

## 2019-12-07 VITALS — BP 110/80 | HR 67 | Ht 66.0 in

## 2019-12-07 DIAGNOSIS — M25561 Pain in right knee: Secondary | ICD-10-CM

## 2019-12-07 DIAGNOSIS — M5136 Other intervertebral disc degeneration, lumbar region: Secondary | ICD-10-CM

## 2019-12-07 DIAGNOSIS — G8929 Other chronic pain: Secondary | ICD-10-CM | POA: Diagnosis not present

## 2019-12-07 MED ORDER — METHYLPREDNISOLONE ACETATE 80 MG/ML IJ SUSP
80.0000 mg | Freq: Once | INTRAMUSCULAR | Status: AC
  Administered 2019-12-07: 80 mg via INTRAMUSCULAR

## 2019-12-07 MED ORDER — VITAMIN D (ERGOCALCIFEROL) 1.25 MG (50000 UNIT) PO CAPS: 50000.0000 [IU] | ORAL_CAPSULE | ORAL | 0 refills | Status: DC

## 2019-12-07 MED ORDER — PREDNISONE 50 MG PO TABS: ORAL_TABLET | ORAL | 0 refills | Status: DC

## 2019-12-07 MED ORDER — KETOROLAC TROMETHAMINE 60 MG/2ML IM SOLN
60.0000 mg | Freq: Once | INTRAMUSCULAR | Status: AC
  Administered 2019-12-07: 60 mg via INTRAMUSCULAR

## 2019-12-07 NOTE — Assessment & Plan Note (Signed)
Severe spinal stenosis. Discussed which activities to do  Patient has not responded as well to the last epidural but has in the past.  Discussed with patient in great length about icing regimen, home exercises, discussed over-the-counter medication and starting once weekly vitamin D for muscle strength and endurance.  Follow-up again 4 weeks

## 2019-12-07 NOTE — Progress Notes (Signed)
[facility] [address] [place] Phone: [phone] Subjective:   I Erica [name] am serving as a Education administrator for Dr. [name].  This visit occurred during the SARS-CoV-2 public health emergency.  Safety protocols were in place, including screening questions prior to the visit, additional usage of staff PPE, and extensive cleaning of exam room while observing appropriate contact time as indicated for disinfecting solutions.   I'm seeing this patient by the request  of:  [name], MD  CC: Low back pain  [id number]  Erica [name] is a 64 y.o. female coming in with complaint of low back and right knee pain. Has had an epidural before. Bilateral sciatic pain. Posterior leg aches. Patient has issues with sleeping. 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 States she feels popping. Posterior achy pain.    Onset- Chronic knee and back  Location - lower back,  Duration- worse at night Character- achy, throbbing Aggravating factors- extension  Reliving factors-  Therapies tried- exercises, gabapentin, baclofen  Severity-  7/10 back    Patient was seen another provider and had an MRI in June 2020 this was independently visualized by me showing mild lumbar spinal stenosis and spondylosis but very minimal.  Patient did have an epidural in December 2020  Past Medical History:  Diagnosis Date  . Abnormal stress test    a. 09/2016: NST showed a small defect of mild severity present in the basal inferoseptal and mid inferoseptal location, consistent with ischemia. --> medically managed  . ALLERGIC RHINITIS   . Anxiety   . Bursitis   . DDD (degenerative disc disease), lumbar    ESI with Ramos (spring 2016)  . Depression   . Diabetes mellitus without complication ([place])   . Dyslipidemia   . External hemorrhoids   . GERD (gastroesophageal reflux disease)   . Hypertension   . Obesity   . Osteoarthritis   . Palpitations    a.  prior event monitor showing sinus tachycardia, no PAF.   Marland Kitchen Panic attacks    Hx of depression  . Sleep apnea    CPAP hs   Past Surgical History:  Procedure Laterality Date  . ABDOMINAL HYSTERECTOMY    . MASS EXCISION Left 12/30/2015   Procedure: EXCISION LEFT LEG MASS;  Surgeon: Donnie Mesa, MD;  Location: [facility];  Service: General;  Laterality: Left;  . REPLACEMENT TOTAL KNEE Right   . RIGHT OOPHORECTOMY     No cancer   Social History   Socioeconomic History  . Marital status: Married    Spouse name: [name]  . Number of children: 3  . Years of education: Not on file  . Highest education level: Not on file  Occupational History  . Occupation: Disabled  Tobacco Use  . Smoking status: Never Smoker  . Smokeless tobacco: Never Used  . Tobacco comment: tried to as a teenager  Substance and Sexual Activity  . Alcohol use: Yes    Alcohol/week: 1.0 standard drinks    Types: 1 Glasses of wine per week    Comment: one drink a month  . Drug use: No  . Sexual activity: Not Currently  Other Topics Concern  . Not on file  Social History Narrative   Married with children. Pt is on disablity. Previous worked in the school system   Social Determinants of Radio broadcast assistant Strain:   . Difficulty of Paying Living Expenses: Not on  file  Food Insecurity:   . Worried About Charity fundraiser in the Last Year: Not on file  . Ran Out of Food in the Last Year: Not on file  Transportation Needs:   . Lack of Transportation (Medical): Not on file  . Lack of Transportation (Non-Medical): Not on file  Physical Activity:   . Days of Exercise per Week: Not on file  . Minutes of Exercise per Session: Not on file  Stress:   . Feeling of Stress : Not on file  Social Connections:   . Frequency of Communication with Friends and Family: Not on file  . Frequency of Social Gatherings with Friends and Family: Not on file  . Attends Religious Services: Not on file  .  Active Member of Clubs or Organizations: Not on file  . Attends Archivist Meetings: Not on file  . Marital Status: Not on file   Allergies  Allergen Reactions  . Seroquel [Quetiapine Fumarate]     Hallucinations, palpitations  . Codeine Nausea And Vomiting  . Guaifenesin-Codeine Other (See Comments)    REACTION: feels spacey  . Wellbutrin [Bupropion] Palpitations    hallucinations   Family History  Problem Relation Age of Onset  . Heart disease Mother        Enlarged Heart, Pacemaker  . Diabetes Mother   . Thyroid disease Mother   . Hyperlipidemia Mother   . Hypertension Mother   . Sleep apnea Mother   . Dementia Father   . Kidney failure Father   . Prostate cancer Father   . Alcoholism Father   . Asthma Other   . Allergies Sister   . Asthma Son   . Breast cancer Maternal Aunt        cousin  . Colon cancer Cousin   . Heart disease Son        CHF, morbid obesity  . Prostate cancer Maternal Uncle   . Diabetes Son     Current Outpatient Medications (Endocrine & Metabolic):  .  metFORMIN (GLUCOPHAGE) 500 MG tablet, Take 1 tablet (500 mg total) by mouth daily with breakfast. .  predniSONE (DELTASONE) 50 MG tablet, Take one tablet daily for the next 5 days.  Current Outpatient Medications (Cardiovascular):  .  atenolol (TENORMIN) 25 MG tablet, Take 45m (1 tablet) in the AM and 562m(2 tablets) in the PM .  furosemide (LASIX) 40 MG tablet, Take 1 tablet (40 mg total) by mouth 2 (two) times daily. .  simvastatin (ZOCOR) 40 MG tablet, Take 1 tablet (40 mg total) by mouth daily at 6 PM.  Current Outpatient Medications (Respiratory):  .  albuterol (VENTOLIN HFA) 108 (90 Base) MCG/ACT inhaler, Inhale 2 puffs into the lungs every 6 (six) hours as needed for wheezing or shortness of breath. .  fluticasone (FLONASE) 50 MCG/ACT nasal spray, Place 2 sprays into both nostrils daily. . Marland Kitchenlevocetirizine (XYZAL) 5 MG tablet, Take 1 tablet (5 mg total) by mouth every  evening.  Current Outpatient Medications (Analgesics):  . Marland KitchenSUMAtriptan-naproxen (TREXIMET) 85-500 MG tablet, Take 1 tablet by mouth every 2 (two) hours as needed for migraine.   Current Outpatient Medications (Other):  . Marland KitchenALPRAZolam (XANAX) 1 MG tablet, Take 1 mg by mouth as needed for anxiety. .  baclofen (LIORESAL) 10 MG tablet, TAKE ONE TABLET BY MOUTH TWICE A DAY AS NEEDED FOR MUSCLE SPASMS .  blood glucose meter kit and supplies KIT, Dispense based on patient and insurance preference. Use up  to four times daily as directed. (FOR E11.9). Marland Kitchen  gabapentin (NEURONTIN) 100 MG capsule, Take 1 capsule (100 mg total) by mouth 3 (three) times daily. .  hydrocortisone (ANUSOL-HC) 2.5 % rectal cream, Place rectally as needed. .  hydrOXYzine (ATARAX/VISTARIL) 10 MG tablet, Take 1 tablet (10 mg total) by mouth 3 (three) times daily as needed. .  linaclotide (LINZESS) 290 MCG CAPS capsule, Take 1 capsule (290 mcg total) by mouth daily before breakfast. .  Multiple Vitamins-Minerals (MULTIVITAMIN WITH MINERALS) tablet, Take 1 tablet by mouth daily. .  NONFORMULARY OR COMPOUNDED ITEM, [facility]:  Antifungal - Terbinafine 3%, Fluconazole 2%, Tea Tree Oin 5%, Urea 10%, Ibuprofen 2% in DMSO #32m. Apply to the affected nail(s) once (at bedtime) or twice daily. .Marland Kitchen omeprazole (PRILOSEC) 20 MG capsule, Take 1 capsule (20 mg total) by mouth daily. .  ondansetron (ZOFRAN) 4 MG tablet, Take 1 tablet (4 mg total) by mouth every 8 (eight) hours as needed for nausea or vomiting. .  potassium chloride SA (KLOR-CON) 20 MEQ tablet, Take 1 tablet (20 mEq total) by mouth 2 (two) times daily. .Marland Kitchen venlafaxine XR (EFFEXOR-XR) 150 MG 24 hr capsule, Take 150 mg by mouth 2 (two) times daily. .Marland Kitchen VITAMIN D PO, Take 1 tablet by mouth daily. .  Vitamin D, Ergocalciferol, (DRISDOL) 1.25 MG (50000 UT) CAPS capsule, Take 1 capsule (50,000 Units total) by mouth every 7 (seven) days. .  Vitamin D, Ergocalciferol, (DRISDOL) 1.25  MG (50000 UNIT) CAPS capsule, Take 1 capsule (50,000 Units total) by mouth every 7 (seven) days.   Reviewed prior external information including notes and imaging from  primary care provider As well as notes that were available from care everywhere and other healthcare systems.  Past medical history, social, surgical and family history all reviewed in electronic medical record.  No pertanent information unless stated regarding to the chief complaint.   Review of Systems:  No headache, visual changes, nausea, vomiting, diarrhea, constipation, dizziness, abdominal pain, skin rash, fevers, chills, night sweats, weight loss, swollen lymph nodes, , joint swelling, chest pain, shortness of breath, mood changes. POSITIVE muscle aches, body aches  Objective  Blood pressure 110/80, pulse 67, height '5\' 6"'  (1.676 m), SpO2 98 %.   General: No apparent distress alert and oriented x3 mood and affect normal, dressed appropriately.  HEENT: Pupils equal, extraocular movements intact  Respiratory: Patient's speak in full sentences and does not appear short of breath  Cardiovascular: No lower extremity edema, non tender, no erythema  Skin: Warm dry intact with no signs of infection or rash on extremities or on axial skeleton.  Abdomen: Soft nontender  Neuro: Cranial nerves II through XII are intact, neurovascularly intact in all extremities with 2+ DTRs and 2+ pulses.  Lymph: No lymphadenopathy of posterior or anterior cervical chain or axillae bilaterally.  Gait normal with good balance and coordination.  MSK:  Non tender with full range of motion and good stability and symmetric strength and tone of shoulders, elbows, wrist, hip, knee and ankles bilaterally.  Back exam does have some loss of lordosis and some degenerative scoliosis noted.  Patient does have significant tightness with straight leg test bilaterally.  Deep tendon reflexes intact.  4+ out of 5 strength.  Patient remains severely tender in the  right sensation palpation in the lumbar spine.  Mild radicular symptoms SLT bilaterally at 25 degrees    Impression and Recommendations:     This case required medical decision making of moderate  complexity. The above documentation has been reviewed and is accurate and complete Lyndal Pulley, DO       Note: This dictation was prepared with Dragon dictation along with smaller phrase technology. Any transcriptional errors that result from this process are unintentional.

## 2019-12-07 NOTE — Patient Instructions (Signed)
Good to see you.  Ice 20 minutes 2 times daily. Usually after activity and before bed. Exercises 3 times a week.  Prednisone start tomorrow  Once weekly vitamin D for 12 weeks.  Turmeric 500mg  daily  Tart cherry extract 1200mg  at night See me again in

## 2020-01-14 ENCOUNTER — Other Ambulatory Visit: Payer: Self-pay

## 2020-01-14 ENCOUNTER — Ambulatory Visit (INDEPENDENT_AMBULATORY_CARE_PROVIDER_SITE_OTHER): Payer: Medicare Other | Admitting: Family Medicine

## 2020-01-14 ENCOUNTER — Encounter: Payer: Self-pay | Admitting: Family Medicine

## 2020-01-14 DIAGNOSIS — M5136 Other intervertebral disc degeneration, lumbar region: Secondary | ICD-10-CM

## 2020-01-14 DIAGNOSIS — Z96651 Presence of right artificial knee joint: Secondary | ICD-10-CM

## 2020-01-14 DIAGNOSIS — G8929 Other chronic pain: Secondary | ICD-10-CM | POA: Insufficient documentation

## 2020-01-14 DIAGNOSIS — M25561 Pain in right knee: Secondary | ICD-10-CM | POA: Diagnosis not present

## 2020-01-14 NOTE — Assessment & Plan Note (Signed)
Patient does have some instability noted.  Possibly even a small effusion.  16 years since surgery.  Discussed the possibility of bone scan but patient would want to avoid surgical intervention.  Due to the instability and abnormal thigh to calf ratio and a OA stability brace could be beneficial for her and elongate the lifespan of this knee replacement.  Patient is in agreement with the plan and will consider also compression.  Follow-up again in 4 to 6 weeks

## 2020-01-14 NOTE — Patient Instructions (Signed)
Good to see you  I am so happy you are improving.  Keep it up  They will call you on rhe brace for the knee  See me again in 8-12 weeks

## 2020-01-14 NOTE — Progress Notes (Signed)
[facility] [address] [place] Phone: [phone] Subjective:   I Erica [name] am serving as a Education administrator for Dr. [name].  This visit occurred during the SARS-CoV-2 public health emergency.  Safety protocols were in place, including screening questions prior to the visit, additional usage of staff PPE, and extensive cleaning of exam room while observing appropriate contact time as indicated for disinfecting solutions.   I'm seeing this patient by the request  of:  [name], MD  CC: Low back pain, knee pain follow-up  [id number]   12/07/2019 Severe spinal stenosis. Discussed which activities to do  Patient has not responded as well to the last epidural but has in the past.  Discussed with patient in great length about icing regimen, home exercises, discussed over-the-counter medication and starting once weekly vitamin D for muscle strength and endurance.  Follow-up again 4 weeks  01/14/2020 Erica [name] is a 64 y.o. female coming in with complaint of right knee pain. Back is doing well. Knee is doing ok. Most impressed with the decrease back and hip pain. Still taking OTC vitamins.  Patient would state that her back pain seems to be about 85 to 95% better.     Past Medical History:  Diagnosis Date  . Abnormal stress test    a. 09/2016: NST showed a small defect of mild severity present in the basal inferoseptal and mid inferoseptal location, consistent with ischemia. --> medically managed  . ALLERGIC RHINITIS   . Anxiety   . Bursitis   . DDD (degenerative disc disease), lumbar    ESI with Ramos (spring 2016)  . Depression   . Diabetes mellitus without complication ([place])   . Dyslipidemia   . External hemorrhoids   . GERD (gastroesophageal reflux disease)   . Hypertension   . Obesity   . Osteoarthritis   . Palpitations    a. prior event monitor showing sinus tachycardia, no PAF.   Marland Kitchen Panic attacks    Hx of  depression  . Sleep apnea    CPAP hs   Past Surgical History:  Procedure Laterality Date  . ABDOMINAL HYSTERECTOMY    . MASS EXCISION Left 12/30/2015   Procedure: EXCISION LEFT LEG MASS;  Surgeon: Donnie Mesa, MD;  Location: [facility];  Service: General;  Laterality: Left;  . REPLACEMENT TOTAL KNEE Right   . RIGHT OOPHORECTOMY     No cancer   Social History   Socioeconomic History  . Marital status: Married    Spouse name: [name]  . Number of children: 3  . Years of education: Not on file  . Highest education level: Not on file  Occupational History  . Occupation: Disabled  Tobacco Use  . Smoking status: Never Smoker  . Smokeless tobacco: Never Used  . Tobacco comment: tried to as a teenager  Substance and Sexual Activity  . Alcohol use: Yes    Alcohol/week: 1.0 standard drinks    Types: 1 Glasses of wine per week    Comment: one drink a month  . Drug use: No  . Sexual activity: Not Currently  Other Topics Concern  . Not on file  Social History Narrative   Married with children. Pt is on disablity. Previous worked in the school system   Social Determinants of Radio broadcast assistant Strain:   . Difficulty of Paying Living Expenses:   Food Insecurity:   . Worried About Charity fundraiser  in the Last Year:   . [place] in the Last Year:   Transportation Needs:   . Film/video editor (Medical):   Marland Kitchen Lack of Transportation (Non-Medical):   Physical Activity:   . Days of Exercise per Week:   . Minutes of Exercise per Session:   Stress:   . Feeling of Stress :   Social Connections:   . Frequency of Communication with Friends and Family:   . Frequency of Social Gatherings with Friends and Family:   . Attends Religious Services:   . Active Member of Clubs or Organizations:   . Attends Archivist Meetings:   Marland Kitchen Marital Status:    Allergies  Allergen Reactions  . Seroquel [Quetiapine Fumarate]     Hallucinations,  palpitations  . Codeine Nausea And Vomiting  . Guaifenesin-Codeine Other (See Comments)    REACTION: feels spacey  . Wellbutrin [Bupropion] Palpitations    hallucinations   Family History  Problem Relation Age of Onset  . Heart disease Mother        Enlarged Heart, Pacemaker  . Diabetes Mother   . Thyroid disease Mother   . Hyperlipidemia Mother   . Hypertension Mother   . Sleep apnea Mother   . Dementia Father   . Kidney failure Father   . Prostate cancer Father   . Alcoholism Father   . Asthma Other   . Allergies Sister   . Asthma Son   . Breast cancer Maternal Aunt        cousin  . Colon cancer Cousin   . Heart disease Son        CHF, morbid obesity  . Prostate cancer Maternal Uncle   . Diabetes Son     Current Outpatient Medications (Endocrine & Metabolic):  .  metFORMIN (GLUCOPHAGE) 500 MG tablet, Take 1 tablet (500 mg total) by mouth daily with breakfast. .  predniSONE (DELTASONE) 50 MG tablet, Take one tablet daily for the next 5 days.  Current Outpatient Medications (Cardiovascular):  .  atenolol (TENORMIN) 25 MG tablet, Take 66m (1 tablet) in the AM and 512m(2 tablets) in the PM .  furosemide (LASIX) 40 MG tablet, Take 1 tablet (40 mg total) by mouth 2 (two) times daily. .  simvastatin (ZOCOR) 40 MG tablet, Take 1 tablet (40 mg total) by mouth daily at 6 PM.  Current Outpatient Medications (Respiratory):  .  albuterol (VENTOLIN HFA) 108 (90 Base) MCG/ACT inhaler, Inhale 2 puffs into the lungs every 6 (six) hours as needed for wheezing or shortness of breath. .  fluticasone (FLONASE) 50 MCG/ACT nasal spray, Place 2 sprays into both nostrils daily. . Marland Kitchenlevocetirizine (XYZAL) 5 MG tablet, Take 1 tablet (5 mg total) by mouth every evening.  Current Outpatient Medications (Analgesics):  . Marland KitchenSUMAtriptan-naproxen (TREXIMET) 85-500 MG tablet, Take 1 tablet by mouth every 2 (two) hours as needed for migraine.   Current Outpatient Medications (Other):  . Marland KitchenALPRAZolam  (XANAX) 1 MG tablet, Take 1 mg by mouth as needed for anxiety. .  baclofen (LIORESAL) 10 MG tablet, TAKE ONE TABLET BY MOUTH TWICE A DAY AS NEEDED FOR MUSCLE SPASMS .  blood glucose meter kit and supplies KIT, Dispense based on patient and insurance preference. Use up to four times daily as directed. (FOR E11.9). . Marland Kitchengabapentin (NEURONTIN) 100 MG capsule, Take 1 capsule (100 mg total) by mouth 3 (three) times daily. .  hydrocortisone (ANUSOL-HC) 2.5 % rectal cream, Place rectally as needed. .Marland Kitchen  hydrOXYzine (ATARAX/VISTARIL) 10 MG tablet, Take 1 tablet (10 mg total) by mouth 3 (three) times daily as needed. .  linaclotide (LINZESS) 290 MCG CAPS capsule, Take 1 capsule (290 mcg total) by mouth daily before breakfast. .  Multiple Vitamins-Minerals (MULTIVITAMIN WITH MINERALS) tablet, Take 1 tablet by mouth daily. .  NONFORMULARY OR COMPOUNDED ITEM, [facility]:  Antifungal - Terbinafine 3%, Fluconazole 2%, Tea Tree Oin 5%, Urea 10%, Ibuprofen 2% in DMSO #15m. Apply to the affected nail(s) once (at bedtime) or twice daily. .Marland Kitchen omeprazole (PRILOSEC) 20 MG capsule, Take 1 capsule (20 mg total) by mouth daily. .  ondansetron (ZOFRAN) 4 MG tablet, Take 1 tablet (4 mg total) by mouth every 8 (eight) hours as needed for nausea or vomiting. .  potassium chloride SA (KLOR-CON) 20 MEQ tablet, Take 1 tablet (20 mEq total) by mouth 2 (two) times daily. .Marland Kitchen venlafaxine XR (EFFEXOR-XR) 150 MG 24 hr capsule, Take 150 mg by mouth 2 (two) times daily. .Marland Kitchen VITAMIN D PO, Take 1 tablet by mouth daily. .  Vitamin D, Ergocalciferol, (DRISDOL) 1.25 MG (50000 UNIT) CAPS capsule, Take 1 capsule (50,000 Units total) by mouth every 7 (seven) days. .  Vitamin D, Ergocalciferol, (DRISDOL) 1.25 MG (50000 UT) CAPS capsule, Take 1 capsule (50,000 Units total) by mouth every 7 (seven) days.   Reviewed prior external information including notes and imaging from  primary care provider As well as notes that were available from  care everywhere and other healthcare systems.  Past medical history, social, surgical and family history all reviewed in electronic medical record.  No pertanent information unless stated regarding to the chief complaint.   Review of Systems:  No headache, visual changes, nausea, vomiting, diarrhea, constipation, dizziness, abdominal pain, skin rash, fevers, chills, night sweats, weight loss, swollen lymph nodes, , joint swelling, chest pain, shortness of breath, mood changes. POSITIVE muscle aches, body aches  Objective  Blood pressure 124/78, pulse 72, height 5' 6" (1.676 m), SpO2 98 %.   General: No apparent distress alert and oriented x3 mood and affect normal, dressed appropriately.  HEENT: Pupils equal, extraocular movements intact  Respiratory: Patient's speak in full sentences and does not appear short of breath  Cardiovascular: No lower extremity edema, non tender, no erythema  Neuro: Cranial nerves II through XII are intact, neurovascularly intact in all extremities with 2+ DTRs and 2+ pulses.  Gait antalgic Low back exam still has loss of lordosis.  Patient still has significant tightness with extension of the back of only 10 degrees.  Tightness of the hamstrings bilaterally right greater than left. Knee does have the replacement.  Instability though noted with valgus and varus force.  Abnormal thigh to calf ratio noted.   Impression and Recommendations:     This case required medical decision making of moderate complexity. The above documentation has been reviewed and is accurate and complete ZLyndal Pulley DO       Note: This dictation was prepared with Dragon dictation along with smaller phrase technology. Any transcriptional errors that result from this process are unintentional.

## 2020-01-14 NOTE — Assessment & Plan Note (Signed)
Doing well with conservative therapy.  Discussed icing regimen and home exercise, which activities to do which wants to avoid.  Increase activity slowly over the course the next several weeks.  Follow-up again in 4 to 8 weeks

## 2020-02-06 DIAGNOSIS — E119 Type 2 diabetes mellitus without complications: Secondary | ICD-10-CM | POA: Diagnosis not present

## 2020-02-14 DIAGNOSIS — E119 Type 2 diabetes mellitus without complications: Secondary | ICD-10-CM | POA: Diagnosis not present

## 2020-02-14 DIAGNOSIS — H2513 Age-related nuclear cataract, bilateral: Secondary | ICD-10-CM | POA: Diagnosis not present

## 2020-02-14 DIAGNOSIS — H40013 Open angle with borderline findings, low risk, bilateral: Secondary | ICD-10-CM | POA: Diagnosis not present

## 2020-02-28 ENCOUNTER — Other Ambulatory Visit: Payer: Self-pay | Admitting: Family Medicine

## 2020-03-24 ENCOUNTER — Other Ambulatory Visit: Payer: Self-pay

## 2020-03-24 ENCOUNTER — Ambulatory Visit (INDEPENDENT_AMBULATORY_CARE_PROVIDER_SITE_OTHER): Payer: Medicare Other | Admitting: Family Medicine

## 2020-03-24 ENCOUNTER — Encounter: Payer: Self-pay | Admitting: Family Medicine

## 2020-03-24 VITALS — BP 122/72 | HR 80 | Ht 66.0 in | Wt 256.0 lb

## 2020-03-24 DIAGNOSIS — G8929 Other chronic pain: Secondary | ICD-10-CM | POA: Diagnosis not present

## 2020-03-24 DIAGNOSIS — T84032D Mechanical loosening of internal right knee prosthetic joint, subsequent encounter: Secondary | ICD-10-CM | POA: Diagnosis not present

## 2020-03-24 DIAGNOSIS — M545 Low back pain, unspecified: Secondary | ICD-10-CM

## 2020-03-24 DIAGNOSIS — E669 Obesity, unspecified: Secondary | ICD-10-CM | POA: Diagnosis not present

## 2020-03-24 DIAGNOSIS — M25561 Pain in right knee: Secondary | ICD-10-CM

## 2020-03-24 DIAGNOSIS — Z96651 Presence of right artificial knee joint: Secondary | ICD-10-CM

## 2020-03-24 NOTE — Progress Notes (Addendum)
[place] Phone: [phone] Subjective:   Erica [name], am serving as a scribe for Dr. [name]. This visit occurred during the SARS-CoV-2 public health emergency.  Safety protocols were in place, including screening questions prior to the visit, additional usage of staff PPE, and extensive cleaning of exam room while observing appropriate contact time as indicated for disinfecting solutions.   I'm seeing this patient by the request  of:  [name], MD  CC: Knee pain back pain follow-up  XYI:[id number]   01/14/2020 Patient does have some instability noted.  Possibly even a small effusion.  16 years since surgery.  Discussed the possibility of bone scan but patient would want to avoid surgical intervention.  Due to the instability and abnormal thigh to calf ratio and a OA stability brace could be beneficial for her and elongate the lifespan of this knee replacement.  Patient is in agreement with the plan and will consider also compression.  Follow-up again in 4 to 6 weeks  Patient does have some instability noted.  Possibly even a small effusion.  16 years since surgery.  Discussed the possibility of bone scan but patient would want to avoid surgical intervention.  Due to the instability and abnormal thigh to calf ratio and a OA stability brace could be beneficial for her and elongate the lifespan of this knee replacement.  Patient is in agreement with the plan and will consider also compression.  Follow-up again in 4 to 6 weeks   Update 03/24/2020 Erica [name] is a 64 y.o. female coming in with complaint of right knee and back pain. Patient states that she has had an increase in knee pain last night. Back pain is still doing well but is having to help sister as she just had surgery.  Patient states that unfortunately with all the lifting she had to do had some mild increase in her back pain but nothing severe.   Patient still states that the knee and the instability of the knee seems to be the most concerning aspect.      Past Medical History:  Diagnosis Date  . Abnormal stress test    a. 09/2016: NST showed a small defect of mild severity present in the basal inferoseptal and mid inferoseptal location, consistent with ischemia. --> medically managed  . ALLERGIC RHINITIS   . Anxiety   . Bursitis   . DDD (degenerative disc disease), lumbar    ESI with Ramos (spring 2016)  . Depression   . Diabetes mellitus without complication ([place])   . Dyslipidemia   . External hemorrhoids   . GERD (gastroesophageal reflux disease)   . Hypertension   . Obesity   . Osteoarthritis   . Palpitations    a. prior event monitor showing sinus tachycardia, [name] PAF.   Marland Kitchen Panic attacks    Hx of depression  . Sleep apnea    CPAP hs   Past Surgical History:  Procedure Laterality Date  . ABDOMINAL HYSTERECTOMY    . MASS EXCISION Left 12/30/2015   Procedure: EXCISION LEFT LEG MASS;  Surgeon: Donnie Mesa, MD;  Location: [facility];  Service: General;  Laterality: Left;  . REPLACEMENT TOTAL KNEE Right   . RIGHT OOPHORECTOMY     [name] cancer   Social History   Socioeconomic History  . Marital status: Married    Spouse name: [name]  . Number of children: 3  . Years  of education: Not on file  . Highest education level: Not on file  Occupational History  . Occupation: Disabled  Tobacco Use  . Smoking status: Never Smoker  . Smokeless tobacco: Never Used  . Tobacco comment: tried to as a teenager  Vaping Use  . Vaping Use: Never used  Substance and Sexual Activity  . Alcohol use: Yes    Alcohol/week: 1.0 standard drink    Types: 1 Glasses of wine per week    Comment: one drink a month  . Drug use: [name]  . Sexual activity: Not Currently  Other Topics Concern  . Not on file  Social History Narrative   Married with children. Pt is on disablity. Previous worked in the school system   Social  Determinants of Radio broadcast assistant Strain:   . Difficulty of Paying Living Expenses:   Food Insecurity:   . Worried About Charity fundraiser in the Last Year:   . Arboriculturist in the Last Year:   Transportation Needs:   . Film/video editor (Medical):   Marland Kitchen Lack of Transportation (Non-Medical):   Physical Activity:   . Days of Exercise per Week:   . Minutes of Exercise per Session:   Stress:   . Feeling of Stress :   Social Connections:   . Frequency of Communication with Friends and Family:   . Frequency of Social Gatherings with Friends and Family:   . Attends Religious Services:   . Active Member of Clubs or Organizations:   . Attends Archivist Meetings:   Marland Kitchen Marital Status:    Allergies  Allergen Reactions  . Seroquel [Quetiapine Fumarate]     Hallucinations, palpitations  . Codeine Nausea And Vomiting  . Guaifenesin-Codeine Other (See Comments)    REACTION: feels spacey  . Wellbutrin [Bupropion] Palpitations    hallucinations   Family History  Problem Relation Age of Onset  . Heart disease Mother        Enlarged Heart, Pacemaker  . Diabetes Mother   . Thyroid disease Mother   . Hyperlipidemia Mother   . Hypertension Mother   . Sleep apnea Mother   . Dementia Father   . Kidney failure Father   . Prostate cancer Father   . Alcoholism Father   . Asthma Other   . Allergies Sister   . Asthma Son   . Breast cancer Maternal Aunt        cousin  . Colon cancer Cousin   . Heart disease Son        CHF, morbid obesity  . Prostate cancer Maternal Uncle   . Diabetes Son     Current Outpatient Medications (Endocrine & Metabolic):  .  metFORMIN (GLUCOPHAGE) 500 MG tablet, Take 1 tablet (500 mg total) by mouth daily with breakfast.  Current Outpatient Medications (Cardiovascular):  .  atenolol (TENORMIN) 25 MG tablet, Take 73m (1 tablet) in the AM and 574m(2 tablets) in the PM .  furosemide (LASIX) 40 MG tablet, Take 1 tablet (40 mg total)  by mouth 2 (two) times daily. .  simvastatin (ZOCOR) 40 MG tablet, Take 1 tablet (40 mg total) by mouth daily at 6 PM.  Current Outpatient Medications (Respiratory):  .  albuterol (VENTOLIN HFA) 108 (90 Base) MCG/ACT inhaler, Inhale 2 puffs into the lungs every 6 (six) hours as needed for wheezing or shortness of breath. .  fluticasone (FLONASE) 50 MCG/ACT nasal spray, Place 2 sprays into both nostrils daily. .Marland Kitchen  levocetirizine (XYZAL) 5 MG tablet, Take 1 tablet (5 mg total) by mouth every evening.  Current Outpatient Medications (Analgesics):  Marland Kitchen  SUMAtriptan-naproxen (TREXIMET) 85-500 MG tablet, Take 1 tablet by mouth every 2 (two) hours as needed for migraine.   Current Outpatient Medications (Other):  Marland Kitchen  ALPRAZolam (XANAX) 1 MG tablet, Take 1 mg by mouth as needed for anxiety. .  baclofen (LIORESAL) 10 MG tablet, TAKE ONE TABLET BY MOUTH TWICE A DAY AS NEEDED FOR MUSCLE SPASMS .  blood glucose meter kit and supplies KIT, Dispense based on patient and insurance preference. Use up to four times daily as directed. (FOR E11.9). Marland Kitchen  gabapentin (NEURONTIN) 100 MG capsule, Take 1 capsule (100 mg total) by mouth 3 (three) times daily. .  hydrocortisone (ANUSOL-HC) 2.5 % rectal cream, Place rectally as needed. .  hydrOXYzine (ATARAX/VISTARIL) 10 MG tablet, Take 1 tablet (10 mg total) by mouth 3 (three) times daily as needed. .  linaclotide (LINZESS) 290 MCG CAPS capsule, Take 1 capsule (290 mcg total) by mouth daily before breakfast. .  Multiple Vitamins-Minerals (MULTIVITAMIN WITH MINERALS) tablet, Take 1 tablet by mouth daily. .  NONFORMULARY OR COMPOUNDED ITEM, [facility]:  Antifungal - Terbinafine 3%, Fluconazole 2%, Tea Tree Oin 5%, Urea 10%, Ibuprofen 2% in DMSO #15m. Apply to the affected nail(s) once (at bedtime) or twice daily. .Marland Kitchen omeprazole (PRILOSEC) 20 MG capsule, Take 1 capsule (20 mg total) by mouth daily. .  ondansetron (ZOFRAN) 4 MG tablet, Take 1 tablet (4 mg total) by mouth  every 8 (eight) hours as needed for nausea or vomiting. .  potassium chloride SA (KLOR-CON) 20 MEQ tablet, Take 1 tablet (20 mEq total) by mouth 2 (two) times daily. .Marland Kitchen venlafaxine XR (EFFEXOR-XR) 150 MG 24 hr capsule, Take 150 mg by mouth 2 (two) times daily. .Marland Kitchen VITAMIN D PO, Take 1 tablet by mouth daily. .  Vitamin D, Ergocalciferol, (DRISDOL) 1.25 MG (50000 UNIT) CAPS capsule, TAKE 1 CAPSULE (50,000 UNITS TOTAL) BY MOUTH EVERY 7 (SEVEN) DAYS. .Marland Kitchen Vitamin D, Ergocalciferol, (DRISDOL) 1.25 MG (50000 UT) CAPS capsule, Take 1 capsule (50,000 Units total) by mouth every 7 (seven) days.   Reviewed prior external information including notes and imaging from  primary care provider As well as notes that were available from care everywhere and other healthcare systems.  Past medical history, social, surgical and family history all reviewed in electronic medical record.  [name] pertanent information unless stated regarding to the chief complaint.   Review of Systems:  [name] headache, visual changes, nausea, vomiting, diarrhea, constipation, dizziness, abdominal pain, skin rash, fevers, chills, night sweats, weight loss, swollen lymph nodes, body aches, joint swelling, chest pain, shortness of breath, mood changes. POSITIVE muscle aches  Objective  Blood pressure 122/72, pulse 80, height '5\' 6"'  (1.676 m), weight 256 lb (116.1 kg), SpO2 98 %.   General: [name] apparent distress alert and oriented x3 mood and affect normal, dressed appropriately.  HEENT: Pupils equal, extraocular movements intact  Respiratory: Patient's speak in full sentences and does not appear short of breath  Cardiovascular: Trace lower extremity edema, non tender, [name] erythema  Neuro: Cranial nerves II through XII are intact, neurovascularly intact in all extremities with 2+ DTRs and 2+ pulses.  Gait antalgic.  MSK: Patient's right knee does have the replacement noted.  Possibly trace effusion noted.  95 degrees of flexion are noted.  Patient  does have an audible clicking with valgus and varus force. Instability noted with valgus  and varus force.   Patient states that it is somewhat uncomfortable.  Neurovascularly intact distally.    Impression and Recommendations:     The above documentation has been reviewed and is accurate and complete Lyndal Pulley, DO       Note: This dictation was prepared with Dragon dictation along with smaller phrase technology. Any transcriptional errors that result from this process are unintentional.

## 2020-03-24 NOTE — Assessment & Plan Note (Addendum)
Chronic problem with worsening symptoms.  Patient is having significant instability of the knee at this moment.  Increasing discomfort and pain as well with abnormal thigh to calf ratio.  Patient has baclofen discussed previously.  I do believe a bone scan is necessary as well.  Discussed home exercises and icing regimen.  Patient will increase activity slowly.  Follow-up again in after imaging to discuss different treatment options.

## 2020-03-24 NOTE — Patient Instructions (Addendum)
Shoes: New Balance Greater than 990 or 1080, HOKA Bondi 7 Look for shoes at [employer], [employer], [employer] Feet Bone scan Healthy weight and wellness

## 2020-03-24 NOTE — Assessment & Plan Note (Signed)
Continue to monitor but likely secondary to some of the compensation for the knee.

## 2020-03-25 ENCOUNTER — Telehealth: Payer: Self-pay

## 2020-03-25 NOTE — Telephone Encounter (Signed)
Mychart message sent to patient.

## 2020-04-16 ENCOUNTER — Encounter: Payer: Self-pay | Admitting: Podiatry

## 2020-04-16 ENCOUNTER — Other Ambulatory Visit: Payer: Self-pay

## 2020-04-16 ENCOUNTER — Ambulatory Visit (INDEPENDENT_AMBULATORY_CARE_PROVIDER_SITE_OTHER): Payer: Medicare Other

## 2020-04-16 ENCOUNTER — Ambulatory Visit (INDEPENDENT_AMBULATORY_CARE_PROVIDER_SITE_OTHER): Payer: Medicare Other | Admitting: Podiatry

## 2020-04-16 DIAGNOSIS — E1140 Type 2 diabetes mellitus with diabetic neuropathy, unspecified: Secondary | ICD-10-CM

## 2020-04-16 DIAGNOSIS — M79672 Pain in left foot: Secondary | ICD-10-CM | POA: Diagnosis not present

## 2020-04-16 DIAGNOSIS — M79674 Pain in right toe(s): Secondary | ICD-10-CM

## 2020-04-16 DIAGNOSIS — M21619 Bunion of unspecified foot: Secondary | ICD-10-CM | POA: Diagnosis not present

## 2020-04-16 DIAGNOSIS — M79671 Pain in right foot: Secondary | ICD-10-CM | POA: Diagnosis not present

## 2020-04-16 DIAGNOSIS — B351 Tinea unguium: Secondary | ICD-10-CM

## 2020-04-16 DIAGNOSIS — G629 Polyneuropathy, unspecified: Secondary | ICD-10-CM

## 2020-04-16 DIAGNOSIS — E1149 Type 2 diabetes mellitus with other diabetic neurological complication: Secondary | ICD-10-CM | POA: Diagnosis not present

## 2020-04-16 DIAGNOSIS — M79675 Pain in left toe(s): Secondary | ICD-10-CM | POA: Diagnosis not present

## 2020-04-16 NOTE — Patient Instructions (Signed)
Diabetes Mellitus and Foot Care Foot care is an important part of your health, especially when you have diabetes. Diabetes may cause you to have problems because of poor blood flow (circulation) to your feet and legs, which can cause your skin to:  Become thinner and drier.  Break more easily.  Heal more slowly.  Peel and crack. You may also have nerve damage (neuropathy) in your legs and feet, causing decreased feeling in them. This means that you may not notice minor injuries to your feet that could lead to more serious problems. Noticing and addressing any potential problems early is the best way to prevent future foot problems. How to care for your feet Foot hygiene  Wash your feet daily with warm water and mild soap. Do not use hot water. Then, pat your feet and the areas between your toes until they are completely dry. Do not soak your feet as this can dry your skin.  Trim your toenails straight across. Do not dig under them or around the cuticle. File the edges of your nails with an emery board or nail file.  Apply a moisturizing lotion or petroleum jelly to the skin on your feet and to dry, brittle toenails. Use lotion that does not contain alcohol and is unscented. Do not apply lotion between your toes. Shoes and socks  Wear clean socks or stockings every day. Make sure they are not too tight. Do not wear knee-high stockings since they may decrease blood flow to your legs.  Wear shoes that fit properly and have enough cushioning. Always look in your shoes before you put them on to be sure there are no objects inside.  To break in new shoes, wear them for just a few hours a day. This prevents injuries on your feet. Wounds, scrapes, corns, and calluses  Check your feet daily for blisters, cuts, bruises, sores, and redness. If you cannot see the bottom of your feet, use a mirror or ask someone for help.  Do not cut corns or calluses or try to remove them with medicine.  If you  find a minor scrape, cut, or break in the skin on your feet, keep it and the skin around it clean and dry. You may clean these areas with mild soap and water. Do not clean the area with peroxide, alcohol, or iodine.  If you have a wound, scrape, corn, or callus on your foot, look at it several times a day to make sure it is healing and not infected. Check for: ? Redness, swelling, or pain. ? Fluid or blood. ? Warmth. ? Pus or a bad smell. General instructions  Do not cross your legs. This may decrease blood flow to your feet.  Do not use heating pads or hot water bottles on your feet. They may burn your skin. If you have lost feeling in your feet or legs, you may not know this is happening until it is too late.  Protect your feet from hot and cold by wearing shoes, such as at the beach or on hot pavement.  Schedule a complete foot exam at least once a year (annually) or more often if you have foot problems. If you have foot problems, report any cuts, sores, or bruises to your health care provider immediately. Contact a health care provider if:  You have a medical condition that increases your risk of infection and you have any cuts, sores, or bruises on your feet.  You have an injury that is not   healing.  You have redness on your legs or feet.  You feel burning or tingling in your legs or feet.  You have pain or cramps in your legs and feet.  Your legs or feet are numb.  Your feet always feel cold.  You have pain around a toenail. Get help right away if:  You have a wound, scrape, corn, or callus on your foot and: ? You have pain, swelling, or redness that gets worse. ? You have fluid or blood coming from the wound, scrape, corn, or callus. ? Your wound, scrape, corn, or callus feels warm to the touch. ? You have pus or a bad smell coming from the wound, scrape, corn, or callus. ? You have a fever. ? You have a red line going up your leg. Summary  Check your feet every day  for cuts, sores, red spots, swelling, and blisters.  Moisturize feet and legs daily.  Wear shoes that fit properly and have enough cushioning.  If you have foot problems, report any cuts, sores, or bruises to your health care provider immediately.  Schedule a complete foot exam at least once a year (annually) or more often if you have foot problems. This information is not intended to replace advice given to you by your health care provider. Make sure you discuss any questions you have with your health care provider. Document Revised: 06/20/2019 Document Reviewed: 10/29/2016 Elsevier Patient Education  2020 [employer]vier Inc.  

## 2020-04-17 ENCOUNTER — Other Ambulatory Visit: Payer: Self-pay | Admitting: Podiatry

## 2020-04-17 DIAGNOSIS — E1140 Type 2 diabetes mellitus with diabetic neuropathy, unspecified: Secondary | ICD-10-CM

## 2020-04-17 DIAGNOSIS — M21619 Bunion of unspecified foot: Secondary | ICD-10-CM

## 2020-04-17 NOTE — Progress Notes (Signed)
Subjective:   Patient ID: Erica [name], female   DOB: 64 y.o.   MRN: [id number]   HPI Patient states she feels like maybe she has cyst on top of her feet and she does have nail disease of both feet that are incurvated and moderately tender when pressed.  Patient is a diabetic with neuropathy and is done well in the past with diabetic shoes   ROS      Objective:  Physical Exam  Neurovascular status unchanged from previous visit with patient noted to have diminishment sharp dull vibratory and does have slight inflammation of the dorsal midfoot bilateral but nonpainful and mild at its intensity with thick yellow brittle nailbeds 1-5 both feet and hyperostosis medial aspect first metatarsal head bilateral mild and nature     Assessment:  Patient does have mycotic nail infection with pain possibility for cyst formation dorsal bilateral but not intense moderate bunion with diabetic neuropathy     Plan:  HP reviewed all conditions debrided nailbeds 1-5 both feet with no iatrogenic bleeding discussed diabetic shoes and patient will get approved for new diabetic shoes and I do not recommend treatment of cyst unless they were to grow in size or become painful or bunions  X-rays indicate moderate structural bunion deformity bilateral with possibility for slight midtarsal joint arthritis but no indications of spur formation

## 2020-04-24 ENCOUNTER — Encounter (HOSPITAL_COMMUNITY): Admission: RE | Admit: 2020-04-24 | Payer: Medicare Other | Source: Ambulatory Visit

## 2020-04-24 ENCOUNTER — Encounter (HOSPITAL_COMMUNITY): Payer: Medicare Other

## 2020-04-25 ENCOUNTER — Encounter (HOSPITAL_COMMUNITY)
Admission: RE | Admit: 2020-04-25 | Discharge: 2020-04-25 | Disposition: A | Discharge status: Home / Self Care | Payer: Medicare Other | Source: Ambulatory Visit | Attending: Family Medicine | Admitting: Family Medicine

## 2020-04-25 DIAGNOSIS — T84032D Mechanical loosening of internal right knee prosthetic joint, subsequent encounter: Secondary | ICD-10-CM

## 2020-04-25 DIAGNOSIS — M79604 Pain in right leg: Secondary | ICD-10-CM | POA: Diagnosis not present

## 2020-04-25 MED ORDER — TECHNETIUM TC 99M MEDRONATE IV KIT
20.0000 | PACK | Freq: Once | INTRAVENOUS | Status: AC | PRN
  Administered 2020-04-25: 20 via INTRAVENOUS

## 2020-05-01 DIAGNOSIS — T84032D Mechanical loosening of internal right knee prosthetic joint, subsequent encounter: Secondary | ICD-10-CM | POA: Diagnosis not present

## 2020-05-05 ENCOUNTER — Ambulatory Visit (INDEPENDENT_AMBULATORY_CARE_PROVIDER_SITE_OTHER): Payer: Medicare Other | Admitting: Family Medicine

## 2020-05-05 ENCOUNTER — Encounter: Payer: Self-pay | Admitting: Family Medicine

## 2020-05-05 ENCOUNTER — Other Ambulatory Visit: Payer: Self-pay

## 2020-05-05 DIAGNOSIS — M25561 Pain in right knee: Secondary | ICD-10-CM

## 2020-05-05 DIAGNOSIS — G8929 Other chronic pain: Secondary | ICD-10-CM

## 2020-05-05 DIAGNOSIS — Z96651 Presence of right artificial knee joint: Secondary | ICD-10-CM | POA: Diagnosis not present

## 2020-05-05 DIAGNOSIS — M7061 Trochanteric bursitis, right hip: Secondary | ICD-10-CM | POA: Diagnosis not present

## 2020-05-05 MED ORDER — TIZANIDINE HCL 4 MG PO TABS
4.0000 mg | ORAL_TABLET | Freq: Every day | ORAL | 1 refills | Status: DC
Start: 2020-05-05 — End: 2020-08-28

## 2020-05-05 NOTE — Patient Instructions (Addendum)
Injected hip today HOKA Bondhi 7 See me again in 6-8 weeks

## 2020-05-05 NOTE — Assessment & Plan Note (Signed)
Injection given today, tolerated the procedure well, discussed icing regimen and home exercises, topical anti-inflammatories given.  Known degenerative disc disease.  May need to consider advanced imaging and questionable epidurals.  Discussed medications including the gabapentin and baclofen follow-up again in 4 to 8 weeks

## 2020-05-05 NOTE — Assessment & Plan Note (Signed)
Continues pain and feeling of instability.  Bone scan did not show any significant loosening.  I do believe the patient does have some instability and I do feel it custom brace secondary to the abnormal thigh to calf ratio and instability would be beneficial.  We will see if patient can be sized.  Follow-up again in 4 to 8 weeks

## 2020-05-05 NOTE — Progress Notes (Signed)
Erica [name] [place] Phone: [phone] Subjective:   Fontaine No, am serving as a scribe for Dr. [name]. This visit occurred during the SARS-CoV-2 public health emergency.  Safety protocols were in place, including screening questions prior to the visit, additional usage of staff PPE, and extensive cleaning of exam room while observing appropriate contact time as indicated for disinfecting solutions.   I'm seeing this patient by the request  of:  [name], MD  CC: Right knee pain,  [id number]   03/24/2020 Chronic problem with worsening symptoms.  Patient is having significant instability of the knee at this moment.  Increasing discomfort and pain as well with abnormal thigh to calf ratio.  Patient has baclofen discussed previously.  I do believe a bone scan is necessary as well.  Discussed home exercises and icing regimen.  Patient will increase activity slowly.  Follow-up again in after imaging to discuss different treatment options.  Update 05/05/2020 KEMAYA DORNER is a 64 y.o. female coming in with complaint of right knee pain. Patient states that she has been having right hip pain for past 2 weeks over greater trochanter and into her lower back. Pain is worse in the morning but is constant throughout her day. No using anything for her pain.  Also having knee pain that is not improving on right side.   Bone Scan 04/25/2020 IMPRESSION: 1. No scintigraphic evidence of prosthesis loosening or infection. Low level activity along the right knee arthroplasty compatible with postsurgical change.    Past Medical History:  Diagnosis Date  . Abnormal stress test    a. 09/2016: NST showed a small defect of mild severity present in the basal inferoseptal and mid inferoseptal location, consistent with ischemia. --> medically managed  . ALLERGIC RHINITIS   . Anxiety   . Bursitis   . DDD (degenerative disc  disease), lumbar    ESI with Ramos (spring 2016)  . Depression   . Diabetes mellitus without complication ([place])   . Dyslipidemia   . External hemorrhoids   . GERD (gastroesophageal reflux disease)   . Hypertension   . Obesity   . Osteoarthritis   . Palpitations    a. prior event monitor showing sinus tachycardia, no PAF.   Marland Kitchen Panic attacks    Hx of depression  . Sleep apnea    CPAP hs   Past Surgical History:  Procedure Laterality Date  . ABDOMINAL HYSTERECTOMY    . MASS EXCISION Left 12/30/2015   Procedure: EXCISION LEFT LEG MASS;  Surgeon: Donnie Mesa, MD;  Location: [facility];  Service: General;  Laterality: Left;  . REPLACEMENT TOTAL KNEE Right   . RIGHT OOPHORECTOMY     No cancer   Social History   Socioeconomic History  . Marital status: Married    Spouse name: [name]  . Number of children: 3  . Years of education: Not on file  . Highest education level: Not on file  Occupational History  . Occupation: Disabled  Tobacco Use  . Smoking status: Never Smoker  . Smokeless tobacco: Never Used  . Tobacco comment: tried to as a teenager  Vaping Use  . Vaping Use: Never used  Substance and Sexual Activity  . Alcohol use: Yes    Alcohol/week: 1.0 standard drink    Types: 1 Glasses of wine per week    Comment: one drink a month  . Drug use: No  . Sexual  activity: Not Currently  Other Topics Concern  . Not on file  Social History Narrative   Married with children. Pt is on disablity. Previous worked in the school system   Social Determinants of Radio broadcast assistant Strain:   . Difficulty of Paying Living Expenses:   Food Insecurity:   . Worried About Charity fundraiser in the Last Year:   . Arboriculturist in the Last Year:   Transportation Needs:   . Film/video editor (Medical):   Marland Kitchen Lack of Transportation (Non-Medical):   Physical Activity:   . Days of Exercise per Week:   . Minutes of Exercise per Session:   Stress:   .  Feeling of Stress :   Social Connections:   . Frequency of Communication with Friends and Family:   . Frequency of Social Gatherings with Friends and Family:   . Attends Religious Services:   . Active Member of Clubs or Organizations:   . Attends Archivist Meetings:   Marland Kitchen Marital Status:    Allergies  Allergen Reactions  . Seroquel [Quetiapine Fumarate]     Hallucinations, palpitations  . Codeine Nausea And Vomiting  . Guaifenesin-Codeine Other (See Comments)    REACTION: feels spacey  . Wellbutrin [Bupropion] Palpitations    hallucinations   Family History  Problem Relation Age of Onset  . Heart disease Mother        Enlarged Heart, Pacemaker  . Diabetes Mother   . Thyroid disease Mother   . Hyperlipidemia Mother   . Hypertension Mother   . Sleep apnea Mother   . Dementia Father   . Kidney failure Father   . Prostate cancer Father   . Alcoholism Father   . Asthma Other   . Allergies Sister   . Asthma Son   . Breast cancer Maternal Aunt        cousin  . Colon cancer Cousin   . Heart disease Son        CHF, morbid obesity  . Prostate cancer Maternal Uncle   . Diabetes Son     Current Outpatient Medications (Endocrine & Metabolic):  .  metFORMIN (GLUCOPHAGE) 500 MG tablet, Take 1 tablet (500 mg total) by mouth daily with breakfast.  Current Outpatient Medications (Cardiovascular):  .  atenolol (TENORMIN) 25 MG tablet, Take 60m (1 tablet) in the AM and 557m(2 tablets) in the PM .  furosemide (LASIX) 40 MG tablet, Take 1 tablet (40 mg total) by mouth 2 (two) times daily. .  simvastatin (ZOCOR) 40 MG tablet, Take 1 tablet (40 mg total) by mouth daily at 6 PM.  Current Outpatient Medications (Respiratory):  .  albuterol (VENTOLIN HFA) 108 (90 Base) MCG/ACT inhaler, Inhale 2 puffs into the lungs every 6 (six) hours as needed for wheezing or shortness of breath. .  fluticasone (FLONASE) 50 MCG/ACT nasal spray, Place 2 sprays into both nostrils daily. . Marland Kitchen levocetirizine (XYZAL) 5 MG tablet, Take 1 tablet (5 mg total) by mouth every evening.  Current Outpatient Medications (Analgesics):  . Marland KitchenSUMAtriptan-naproxen (TREXIMET) 85-500 MG tablet, Take 1 tablet by mouth every 2 (two) hours as needed for migraine.   Current Outpatient Medications (Other):  . Marland KitchenALPRAZolam (XANAX) 1 MG tablet, Take 1 mg by mouth as needed for anxiety. .  baclofen (LIORESAL) 10 MG tablet, TAKE ONE TABLET BY MOUTH TWICE A DAY AS NEEDED FOR MUSCLE SPASMS .  blood glucose meter kit and supplies KIT, Dispense  based on patient and insurance preference. Use up to four times daily as directed. (FOR E11.9). Marland Kitchen  gabapentin (NEURONTIN) 100 MG capsule, Take 1 capsule (100 mg total) by mouth 3 (three) times daily. .  hydrocortisone (ANUSOL-HC) 2.5 % rectal cream, Place rectally as needed. .  hydrOXYzine (ATARAX/VISTARIL) 10 MG tablet, Take 1 tablet (10 mg total) by mouth 3 (three) times daily as needed. .  linaclotide (LINZESS) 290 MCG CAPS capsule, Take 1 capsule (290 mcg total) by mouth daily before breakfast. .  Multiple Vitamins-Minerals (MULTIVITAMIN WITH MINERALS) tablet, Take 1 tablet by mouth daily. .  NONFORMULARY OR COMPOUNDED ITEM, [facility]:  Antifungal - Terbinafine 3%, Fluconazole 2%, Tea Tree Oin 5%, Urea 10%, Ibuprofen 2% in DMSO #59m. Apply to the affected nail(s) once (at bedtime) or twice daily. .Marland Kitchen omeprazole (PRILOSEC) 20 MG capsule, Take 1 capsule (20 mg total) by mouth daily. .  ondansetron (ZOFRAN) 4 MG tablet, Take 1 tablet (4 mg total) by mouth every 8 (eight) hours as needed for nausea or vomiting. .  potassium chloride SA (KLOR-CON) 20 MEQ tablet, Take 1 tablet (20 mEq total) by mouth 2 (two) times daily. .Marland Kitchen venlafaxine XR (EFFEXOR-XR) 150 MG 24 hr capsule, Take 150 mg by mouth 2 (two) times daily. .Marland Kitchen VITAMIN D PO, Take 1 tablet by mouth daily. .  Vitamin D, Ergocalciferol, (DRISDOL) 1.25 MG (50000 UNIT) CAPS capsule, TAKE 1 CAPSULE (50,000 UNITS  TOTAL) BY MOUTH EVERY 7 (SEVEN) DAYS. .Marland Kitchen Vitamin D, Ergocalciferol, (DRISDOL) 1.25 MG (50000 UT) CAPS capsule, Take 1 capsule (50,000 Units total) by mouth every 7 (seven) days. .Marland Kitchen tiZANidine (ZANAFLEX) 4 MG tablet, Take 1 tablet (4 mg total) by mouth at bedtime.   Reviewed prior external information including notes and imaging from  primary care provider As well as notes that were available from care everywhere and other healthcare systems.  Past medical history, social, surgical and family history all reviewed in electronic medical record.  No pertanent information unless stated regarding to the chief complaint.   Review of Systems:  No headache, visual changes, nausea, vomiting, diarrhea, constipation, dizziness, abdominal pain, skin rash, fevers, chills, night sweats, weight loss, swollen lymph nodes, body aches, joint swelling, chest pain, shortness of breath, mood changes. POSITIVE muscle aches  Objective  Blood pressure 116/80, pulse 75, height 5' 6" (1.676 m), weight (!) 251 lb (113.9 kg), SpO2 97 %.   General: No apparent distress alert and oriented x3 mood and affect normal, dressed appropriately. uncomfortable HEENT: Pupils equal, extraocular movements intact  Respiratory: Patient's speak in full sentences and does not appear short of breath  Cardiovascular: No lower extremity edema, non tender, no erythema  Neuro: Cranial nerves II through XII are intact, neurovascularly intact in all extremities with 2+ DTRs and 2+ pulses.  Gait overweight antalgic noted favoring the right leg.  Patient's right knee still has some mild swelling.  95 degrees of flexion noted.  Patient does still have some mild instability of this replacement with valgus and varus force with audible clicking noted.  Right hip exam does have tenderness to palpation of the greater trochanteric area.  Positive FCorky Sox  Difficult to even do secondary to the amount of discomfort.  Back exam does have some mild diffuse  tenderness.  After verbal consent patient was prepped in sterile fashion with alcohol swabs. Ethyl chloride used patient was injected with a 22-gauge 3 inch needle into the RIGHT lateral hip in the greater trochanteric area under  ultrasound guidance. Picture was taken. Patient had 4 cc of 0.5% Marcaine and 1 cc of Kenalog 40 mg/dL injected. Patient tolerated the procedure well and no blood loss. Pain completely resolved after injection stating proper placement. Post injection instructions given.    Impression and Recommendations:     The above documentation has been reviewed and is accurate and complete Lyndal Pulley, DO       Note: This dictation was prepared with Dragon dictation along with smaller phrase technology. Any transcriptional errors that result from this process are unintentional.

## 2020-05-14 ENCOUNTER — Telehealth: Payer: Self-pay | Admitting: Family Medicine

## 2020-05-14 NOTE — Telephone Encounter (Signed)
Pt called to advise [place] that the brace was mailed to her home, pt unsure when you wanted her to come in for a fitting.

## 2020-05-15 NOTE — Telephone Encounter (Signed)
Emailed Ryan telling him to give patient a call.

## 2020-05-23 DIAGNOSIS — N952 Postmenopausal atrophic vaginitis: Secondary | ICD-10-CM | POA: Diagnosis not present

## 2020-05-23 DIAGNOSIS — R300 Dysuria: Secondary | ICD-10-CM | POA: Diagnosis not present

## 2020-06-01 NOTE — Progress Notes (Signed)
Subjective:    Patient ID: Erica [name], female    DOB: 12-15-1955, 64 y.o.   MRN: [id number]  HPI The patient is here for follow up of their chronic medical problems, including htn, DM, hyperlipidemia, migraines, GERD, constipation  She is taking all of her medications as prescribed.   She is not exercising regularly.     She has increased stress - her sister is staying with her after her recent surgery and she is caring for her.  She has had palpitations and feels it is related to the stress.  She denies any chest pain.      Medications and allergies reviewed with patient and updated if appropriate.  Patient Active Problem List   Diagnosis Date Noted  . Greater trochanteric bursitis of right hip 05/05/2020  . 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 Chronic constipation 08/23/2019  . Abdominal pain 08/23/2019  . Bilateral carotid artery stenosis 06/04/2019  . Osteopenia 05/19/2019  . Sinus pressure 02/12/2019  . Acute pain of left knee 12/11/2018  . Class 2 severe obesity with serious comorbidity and body mass index (BMI) of 37.0 to 37.9 in adult ([place]) 08/31/2018  . Degeneration of lumbar intervertebral disc 05/30/2018  . Onychomycosis 12/07/2017  . Nausea without vomiting 12/07/2017  . Left hip pain 06/12/2017  . Acute left-sided low back pain without sciatica 06/12/2017  . Meningioma ([place]) 03/28/2017  . Panic attacks 09/27/2016  . GERD (gastroesophageal reflux disease) 09/27/2016  . Right ankle pain 08/19/2016  . Diabetes ([place]) 02/15/2016  . Lump of skin 11/28/2015  . Migraine without aura and with status migrainosus, not intractable 10/03/2015  . Numbness of left hand 10/03/2015  . Cephalalgia 10/03/2015  . Palpitations 09/08/2012  . Sciatica of left side   . Peripheral edema 05/09/2012  . FATIGUE 02/23/2010  . Obstructive sleep apnea 10/31/2009  . External hemorrhoids 10/17/2009  . CONSTIPATION, CHRONIC 10/17/2009  .  Hyperlipidemia 01/12/2008  . Depression, major, recurrent ([place]) 01/12/2008  . Essential hypertension, benign 01/12/2008  . Allergic rhinitis 01/12/2008    Current Outpatient Medications on File Prior to Visit  Medication Sig Dispense Refill  . albuterol (VENTOLIN HFA) 108 (90 Base) MCG/ACT inhaler Inhale 2 puffs into the lungs every 6 (six) hours as needed for wheezing or shortness of breath. 16 g 3  . ALPRAZolam (XANAX) 1 MG tablet Take 1 mg by mouth as needed for anxiety.    Marland Kitchen atenolol (TENORMIN) 25 MG tablet Take 19m (1 tablet) in the AM and 526m(2 tablets) in the PM 270 tablet 1  . baclofen (LIORESAL) 10 MG tablet TAKE ONE TABLET BY MOUTH TWICE A DAY AS NEEDED FOR MUSCLE SPASMS 30 each 0  . blood glucose meter kit and supplies KIT Dispense based on patient and insurance preference. Use up to four times daily as directed. (FOR E11.9). 1 each 0  . fluticasone (FLONASE) 50 MCG/ACT nasal spray Place 2 sprays into both nostrils daily. 48 g 2  . furosemide (LASIX) 40 MG tablet Take 1 tablet (40 mg total) by mouth 2 (two) times daily. 120 tablet 1  . gabapentin (NEURONTIN) 100 MG capsule Take 1 capsule (100 mg total) by mouth 3 (three) times daily. 90 capsule 1  . hydrocortisone (ANUSOL-HC) 2.5 % rectal cream Place rectally as needed. 30 g 3  . hydrOXYzine (ATARAX/VISTARIL) 10 MG tablet Take 1 tablet (10 mg total) by mouth 3 (three) times daily as needed. 30 tablet 0  .  levocetirizine (XYZAL) 5 MG tablet Take 1 tablet (5 mg total) by mouth every evening. 90 tablet 1  . linaclotide (LINZESS) 290 MCG CAPS capsule Take 1 capsule (290 mcg total) by mouth daily before breakfast. 90 capsule 1  . metFORMIN (GLUCOPHAGE) 500 MG tablet Take 1 tablet (500 mg total) by mouth daily with breakfast. 90 tablet 1  . Multiple Vitamins-Minerals (MULTIVITAMIN WITH MINERALS) tablet Take 1 tablet by mouth daily.    . NONFORMULARY OR COMPOUNDED ITEM [facility]:  Antifungal - Terbinafine 3%, Fluconazole 2%,  Tea Tree Oin 5%, Urea 10%, Ibuprofen 2% in DMSO #44m. Apply to the affected nail(s) once (at bedtime) or twice daily. 30 each 5  . ondansetron (ZOFRAN) 4 MG tablet Take 1 tablet (4 mg total) by mouth every 8 (eight) hours as needed for nausea or vomiting. 40 tablet 0  . potassium chloride SA (KLOR-CON) 20 MEQ tablet Take 1 tablet (20 mEq total) by mouth 2 (two) times daily. 180 tablet 1  . simvastatin (ZOCOR) 40 MG tablet Take 1 tablet (40 mg total) by mouth daily at 6 PM. 90 tablet 1  . SUMAtriptan-naproxen (TREXIMET) 85-500 MG tablet Take 1 tablet by mouth every 2 (two) hours as needed for migraine. 10 tablet 8  . tiZANidine (ZANAFLEX) 4 MG tablet Take 1 tablet (4 mg total) by mouth at bedtime. 30 tablet 1  . venlafaxine XR (EFFEXOR-XR) 150 MG 24 hr capsule Take 150 mg by mouth 2 (two) times daily.    .Marland KitchenVITAMIN D PO Take 1 tablet by mouth daily.    . [DISCONTINUED] oxycodone (OXY-IR) 5 MG capsule Take 5 mg by mouth every 4 (four) hours as needed. For pain.     No current facility-administered medications on file prior to visit.    Past Medical History:  Diagnosis Date  . Abnormal stress test    a. 09/2016: NST showed a small defect of mild severity present in the basal inferoseptal and mid inferoseptal location, consistent with ischemia. --> medically managed  . ALLERGIC RHINITIS   . Anxiety   . Bursitis   . DDD (degenerative disc disease), lumbar    ESI with Ramos (spring 2016)  . Depression   . Diabetes mellitus [place] complication ([place]   . Dyslipidemia   . External hemorrhoids   . GERD (gastroesophageal reflux disease)   . Hypertension   . Obesity   . Osteoarthritis   . Palpitations    a. prior event monitor showing sinus tachycardia, no PAF.   .Marland KitchenPanic attacks    Hx of depression  . Sleep apnea    CPAP hs    Past Surgical History:  Procedure Laterality Date  . ABDOMINAL HYSTERECTOMY    . MASS EXCISION Left 12/30/2015   Procedure: EXCISION LEFT LEG MASS;  Surgeon:  MDonnie Mesa MD;  [facility]  Service: General;  Laterality: Left;  . REPLACEMENT TOTAL KNEE Right   . RIGHT OOPHORECTOMY     No cancer    Social History   Socioeconomic History  . Marital status: Married    Spouse name: OHendricks Milo . Number of children: 3  . Years of education: Not on file  . Highest education level: Not on file  Occupational History  . Occupation: Disabled  Tobacco Use  . Smoking status: Never Smoker  . Smokeless tobacco: Never Used  . Tobacco comment: tried to as a teenager  Vaping Use  . Vaping Use: Never used  Substance and Sexual Activity  .  Alcohol use: Yes    Alcohol/week: 1.0 standard drink    Types: 1 Glasses of wine per week    Comment: one drink a month  . Drug use: No  . Sexual activity: Not Currently  Other Topics Concern  . Not on file  Social History Narrative   Married with children. Pt is on disablity. Previous worked in the school system   Social Determinants of Radio broadcast assistant Strain:   . Difficulty of Paying Living Expenses: Not on file  Food Insecurity:   . Worried About Charity fundraiser in the Last Year: Not on file  . Ran Out of Food in the Last Year: Not on file  Transportation Needs:   . Lack of Transportation (Medical): Not on file  . Lack of Transportation (Non-Medical): Not on file  Physical Activity:   . Days of Exercise per Week: Not on file  . Minutes of Exercise per Session: Not on file  Stress:   . Feeling of Stress : Not on file  Social Connections:   . Frequency of Communication with Friends and Family: Not on file  . Frequency of Social Gatherings with Friends and Family: Not on file  . Attends Religious Services: Not on file  . Active Member of Clubs or Organizations: Not on file  . Attends Archivist Meetings: Not on file  . Marital Status: Not on file    Family History  Problem Relation Age of Onset  . Heart disease Mother        Enlarged Heart,  Pacemaker  . Diabetes Mother   . Thyroid disease Mother   . Hyperlipidemia Mother   . Hypertension Mother   . Sleep apnea Mother   . Dementia Father   . Kidney failure Father   . Prostate cancer Father   . Alcoholism Father   . Asthma Other   . Allergies Sister   . Asthma Son   . Breast cancer Maternal Aunt        cousin  . Colon cancer Cousin   . Heart disease Son        CHF, morbid obesity  . Prostate cancer Maternal Uncle   . Diabetes Son     Review of Systems  Constitutional: Negative for chills and fever.  Respiratory: Negative for cough, shortness of breath and wheezing.   Cardiovascular: Positive for palpitations and leg swelling. Negative for chest pain.  Gastrointestinal: Positive for constipation.  Neurological: Positive for headaches (occ). Negative for dizziness, light-headedness and numbness.       Objective:   Vitals:   06/02/20 0913  BP: 126/82  Pulse: 68  Temp: 98.1 F (36.7 C)  SpO2: 96%   BP Readings from Last 3 Encounters:  06/02/20 126/82  05/05/20 116/80  03/24/20 122/72   Wt Readings from Last 3 Encounters:  06/02/20 246 lb (111.6 kg)  05/05/20 (!) 251 lb (113.9 kg)  03/24/20 256 lb (116.1 kg)   Body mass index is 39.71 kg/m.   Physical Exam    Constitutional: Appears well-developed and well-nourished. No distress.  HENT:  Head: Normocephalic and atraumatic.  Neck: Neck supple. No tracheal deviation present. No thyromegaly present.  No cervical lymphadenopathy Cardiovascular: Normal rate, regular rhythm and normal heart sounds.   No murmur heard. No carotid bruit .  No edema Pulmonary/Chest: Effort normal and breath sounds normal. No respiratory distress. No has no wheezes. No rales.  Skin: Skin is warm and dry. Not diaphoretic.  Psychiatric:  Normal mood and affect. Behavior is normal.   Diabetic Foot Exam - Simple   Simple Foot Form Diabetic Foot exam was performed with the following findings: Yes 06/02/2020  1:01 PM  Visual  Inspection No deformities, no ulcerations, no other skin breakdown bilaterally: Yes Sensation Testing Intact to touch and monofilament testing bilaterally: Yes Pulse Check Posterior Tibialis and Dorsalis pulse intact bilaterally: Yes Comments       Assessment & Plan:    See Problem List for Assessment and Plan of chronic medical problems.    This visit occurred during the SARS-CoV-2 public health emergency.  Safety protocols were in place, including screening questions prior to the visit, additional usage of staff PPE, and extensive cleaning of exam room while observing appropriate contact time as indicated for disinfecting solutions.

## 2020-06-01 NOTE — Patient Instructions (Addendum)
  Blood work was ordered.     Medications reviewed and updated.  Changes include :   none   Pneumovax immunization administered today.    Please followup in 6 months

## 2020-06-02 ENCOUNTER — Encounter: Payer: Self-pay | Admitting: Internal Medicine

## 2020-06-02 ENCOUNTER — Telehealth: Payer: Self-pay

## 2020-06-02 ENCOUNTER — Other Ambulatory Visit: Payer: Self-pay | Admitting: Family Medicine

## 2020-06-02 ENCOUNTER — Other Ambulatory Visit: Payer: Self-pay

## 2020-06-02 ENCOUNTER — Ambulatory Visit (INDEPENDENT_AMBULATORY_CARE_PROVIDER_SITE_OTHER): Payer: Medicare Other | Admitting: Internal Medicine

## 2020-06-02 VITALS — BP 126/82 | HR 68 | Temp 98.1°F | Ht 66.0 in | Wt 246.0 lb

## 2020-06-02 DIAGNOSIS — K5909 Other constipation: Secondary | ICD-10-CM

## 2020-06-02 DIAGNOSIS — E119 Type 2 diabetes mellitus without complications: Secondary | ICD-10-CM | POA: Diagnosis not present

## 2020-06-02 DIAGNOSIS — I10 Essential (primary) hypertension: Secondary | ICD-10-CM

## 2020-06-02 DIAGNOSIS — Z23 Encounter for immunization: Secondary | ICD-10-CM | POA: Diagnosis not present

## 2020-06-02 DIAGNOSIS — G43001 Migraine without aura, not intractable, with status migrainosus: Secondary | ICD-10-CM

## 2020-06-02 DIAGNOSIS — K219 Gastro-esophageal reflux disease without esophagitis: Secondary | ICD-10-CM

## 2020-06-02 DIAGNOSIS — E7849 Other hyperlipidemia: Secondary | ICD-10-CM | POA: Diagnosis not present

## 2020-06-02 MED ORDER — OMEPRAZOLE 20 MG PO CPDR: 20.0000 mg | DELAYED_RELEASE_CAPSULE | Freq: Every day | ORAL | 1 refills | Status: DC

## 2020-06-02 NOTE — Assessment & Plan Note (Signed)
chroic Taking linzess daily Not ideally controlled Seeing GI

## 2020-06-02 NOTE — Assessment & Plan Note (Signed)
Chronic Check A1c, urine microalbumin Encouraged increased activity Continue current medication

## 2020-06-02 NOTE — Assessment & Plan Note (Signed)
Chronic BP well controlled Current regimen effective and well tolerated Continue current medications at current doses cmp  

## 2020-06-02 NOTE — Assessment & Plan Note (Signed)
Chronic GERD controlled Continue daily medication Omeprazole 20 mg daily

## 2020-06-02 NOTE — Assessment & Plan Note (Signed)
Chronic 1/ month Takes treximet as needed continue

## 2020-06-02 NOTE — Assessment & Plan Note (Signed)
Chronic Check lipid panel  Continue daily statin Regular exercise and healthy diet encouraged  

## 2020-06-03 LAB — COMPLETE METABOLIC PANEL WITH GFR
AG Ratio: 1.4 (calc) (ref 1.0–2.5)
ALT: 13 U/L (ref 6–29)
AST: 14 U/L (ref 10–35)
Albumin: 4 g/dL (ref 3.6–5.1)
Alkaline phosphatase (APISO): 153 U/L (ref 37–153)
BUN: 21 mg/dL (ref 7–25)
CO2: 30 mmol/L (ref 20–32)
Calcium: 9.8 mg/dL (ref 8.6–10.4)
Chloride: 104 mmol/L (ref 98–110)
Creat: 0.77 mg/dL (ref 0.50–0.99)
GFR, Est African American: 93 mL/min/{1.73_m2} (ref >=60)
GFR, Est Non African American: 80 mL/min/{1.73_m2} (ref >=60)
Globulin: 2.9 g/dL (calc) (ref 1.9–3.7)
Glucose, Bld: 105 mg/dL — ABNORMAL HIGH (ref 65–99)
Potassium: 4.7 mmol/L (ref 3.5–5.3)
Sodium: 141 mmol/L (ref 135–146)
Total Bilirubin: 0.2 mg/dL (ref 0.2–1.2)
Total Protein: 6.9 g/dL (ref 6.1–8.1)

## 2020-06-03 LAB — LIPID PANEL
Cholesterol: 162 mg/dL (ref <200)
HDL: 57 mg/dL (ref >=50)
LDL Cholesterol (Calc): 86 mg/dL (calc)
Non-HDL Cholesterol (Calc): 105 mg/dL (calc) (ref <130)
Total CHOL/HDL Ratio: 2.8 (calc) (ref <5.0)
Triglycerides: 98 mg/dL (ref <150)

## 2020-06-03 LAB — HEMOGLOBIN A1C
Hgb A1c MFr Bld: 6.3 % of total Hgb — ABNORMAL HIGH (ref <5.7)
Mean Plasma Glucose: 134 (calc)
eAG (mmol/L): 7.4 (calc)

## 2020-06-03 LAB — MICROALBUMIN / CREATININE URINE RATIO
Creatinine, Urine: 339 mg/dL — ABNORMAL HIGH (ref 20–275)
Microalb Creat Ratio: 1 mcg/mg creat (ref <30)
Microalb, Ur: 0.5 mg/dL

## 2020-06-11 ENCOUNTER — Ambulatory Visit (HOSPITAL_COMMUNITY)
Admission: RE | Admit: 2020-06-11 | Payer: Medicare Other | Source: Ambulatory Visit | Attending: Cardiology | Admitting: Cardiology

## 2020-06-12 NOTE — Telephone Encounter (Signed)
Second dose entered under immunization tab today.

## 2020-06-19 ENCOUNTER — Other Ambulatory Visit: Payer: Self-pay

## 2020-06-19 ENCOUNTER — Other Ambulatory Visit (HOSPITAL_COMMUNITY): Payer: Self-pay | Admitting: Cardiology

## 2020-06-19 ENCOUNTER — Ambulatory Visit (HOSPITAL_COMMUNITY)
Admission: RE | Admit: 2020-06-19 | Discharge: 2020-06-19 | Disposition: A | Discharge status: Home / Self Care | Payer: Medicare Other | Source: Ambulatory Visit | Attending: Cardiovascular Disease | Admitting: Cardiovascular Disease

## 2020-06-19 DIAGNOSIS — I773 Arterial fibromuscular dysplasia: Secondary | ICD-10-CM

## 2020-06-20 ENCOUNTER — Telehealth: Payer: Self-pay | Admitting: Internal Medicine

## 2020-06-20 DIAGNOSIS — J301 Allergic rhinitis due to pollen: Secondary | ICD-10-CM

## 2020-06-20 MED ORDER — POTASSIUM CHLORIDE CRYS ER 20 MEQ PO TBCR: 20.0000 meq | EXTENDED_RELEASE_TABLET | Freq: Two times a day (BID) | ORAL | 1 refills | Status: DC

## 2020-06-20 MED ORDER — SIMVASTATIN 40 MG PO TABS
40.0000 mg | ORAL_TABLET | Freq: Every day | ORAL | 1 refills | Status: DC
Start: 2020-06-20 — End: 2020-06-27

## 2020-06-20 MED ORDER — FUROSEMIDE 40 MG PO TABS: 40.0000 mg | ORAL_TABLET | Freq: Two times a day (BID) | ORAL | 1 refills | Status: DC

## 2020-06-20 MED ORDER — ATENOLOL 25 MG PO TABS: ORAL_TABLET | ORAL | 1 refills | Status: DC

## 2020-06-20 MED ORDER — LINACLOTIDE 290 MCG PO CAPS: 290.0000 ug | ORAL_CAPSULE | Freq: Every day | ORAL | 1 refills | Status: DC

## 2020-06-20 MED ORDER — FLUTICASONE PROPIONATE 50 MCG/ACT NA SUSP: 2.0000 | Freq: Every day | NASAL | 1 refills | Status: DC

## 2020-06-20 MED ORDER — METFORMIN HCL 500 MG PO TABS
500.0000 mg | ORAL_TABLET | Freq: Every day | ORAL | 1 refills | Status: DC
Start: 2020-06-20 — End: 2020-06-24

## 2020-06-20 MED ORDER — OMEPRAZOLE 20 MG PO CPDR: 20.0000 mg | DELAYED_RELEASE_CAPSULE | Freq: Every day | ORAL | 1 refills | Status: DC

## 2020-06-20 MED ORDER — VENLAFAXINE HCL ER 150 MG PO CP24: 150.0000 mg | ORAL_CAPSULE | Freq: Two times a day (BID) | ORAL | 1 refills | Status: DC

## 2020-06-20 NOTE — Progress Notes (Signed)
[facility] [address] [place] Phone: [phone] Subjective:   I Erica [name] am serving as a Education administrator for Dr. [name].  This visit occurred during the SARS-CoV-2 public health emergency.  Safety protocols were in place, including screening questions prior to the visit, additional usage of staff PPE, and extensive cleaning of exam room while observing appropriate contact time as indicated for disinfecting solutions.   I'm seeing this patient by the request  of:  [name], MD  CC: Knee pain, hip pain follow-up  [id number]   05/05/2020 Injection given today, tolerated the procedure well, discussed icing regimen and home exercises, topical anti-inflammatories given.  Known degenerative disc disease.  May need to consider advanced imaging and questionable epidurals.  Discussed medications including the gabapentin and baclofen follow-up again in 4 to 8 weeks  Continues pain and feeling of instability.  Bone scan did not show any significant loosening.  I do believe the patient does have some instability and I do feel it custom brace secondary to the abnormal thigh to calf ratio and instability would be beneficial.  We will see if patient can be sized.  Follow-up again in 4 to 8 weeks  Update 06/24/2020 Erica [name] is a 64 y.o. female coming in with complaint of right hip and right knee pain (TKR). Patient states she is stiff today.  Patient states that she is looking forward to getting the brace on.  She has not talked to the company who did make it at this time.       Past Medical History:  Diagnosis Date  . Abnormal stress test    a. 09/2016: NST showed a small defect of mild severity present in the basal inferoseptal and mid inferoseptal location, consistent with ischemia. --> medically managed  . ALLERGIC RHINITIS   . Anxiety   . Bursitis   . DDD (degenerative disc disease), lumbar    ESI with [facility] (spring 2016)    . Depression   . Diabetes mellitus without complication ([place])   . Dyslipidemia   . External hemorrhoids   . GERD (gastroesophageal reflux disease)   . Hypertension   . Obesity   . Osteoarthritis   . Palpitations    a. prior event monitor showing sinus tachycardia, no PAF.   Marland Kitchen Panic attacks    Hx of depression  . Sleep apnea    CPAP hs   Past Surgical History:  Procedure Laterality Date  . ABDOMINAL HYSTERECTOMY    . MASS EXCISION Left 12/30/2015   Procedure: EXCISION LEFT LEG MASS;  Surgeon: Donnie Mesa, MD;  Location: [facility];  Service: General;  Laterality: Left;  . REPLACEMENT TOTAL KNEE Right   . RIGHT OOPHORECTOMY     No cancer   Social History   Socioeconomic History  . Marital status: Married    Spouse name: [name]  . Number of children: 3  . Years of education: Not on file  . Highest education level: Not on file  Occupational History  . Occupation: Disabled  Tobacco Use  . Smoking status: Never Smoker  . Smokeless tobacco: Never Used  . Tobacco comment: tried to as a teenager  Vaping Use  . Vaping Use: Never used  Substance and Sexual Activity  . Alcohol use: Yes    Alcohol/week: 1.0 standard drink    Types: 1 Glasses of wine per week    Comment: one drink a month  . Drug use:  No  . Sexual activity: Not Currently  Other Topics Concern  . Not on file  Social History Narrative   Married with children. Pt is on disablity. Previous worked in the school system   Social Determinants of Radio broadcast assistant Strain:   . Difficulty of Paying Living Expenses: Not on file  Food Insecurity:   . Worried About Charity fundraiser in the Last Year: Not on file  . Ran Out of Food in the Last Year: Not on file  Transportation Needs:   . Lack of Transportation (Medical): Not on file  . Lack of Transportation (Non-Medical): Not on file  Physical Activity:   . Days of Exercise per Week: Not on file  . Minutes of Exercise per Session: Not  on file  Stress:   . Feeling of Stress : Not on file  Social Connections:   . Frequency of Communication with Friends and Family: Not on file  . Frequency of Social Gatherings with Friends and Family: Not on file  . Attends Religious Services: Not on file  . Active Member of Clubs or Organizations: Not on file  . Attends Archivist Meetings: Not on file  . Marital Status: Not on file   Allergies  Allergen Reactions  . Seroquel [Quetiapine Fumarate]     Hallucinations, palpitations  . Codeine Nausea And Vomiting  . Guaifenesin-Codeine Other (See Comments)    REACTION: feels spacey  . Wellbutrin [Bupropion] Palpitations    hallucinations   Family History  Problem Relation Age of Onset  . Heart disease Mother        Enlarged Heart, Pacemaker  . Diabetes Mother   . Thyroid disease Mother   . Hyperlipidemia Mother   . Hypertension Mother   . Sleep apnea Mother   . Dementia Father   . Kidney failure Father   . Prostate cancer Father   . Alcoholism Father   . Asthma Other   . Allergies Sister   . Asthma Son   . Breast cancer Maternal Aunt        cousin  . Colon cancer Cousin   . Heart disease Son        CHF, morbid obesity  . Prostate cancer Maternal Uncle   . Diabetes Son     Current Outpatient Medications (Endocrine & Metabolic):  .  metFORMIN (GLUCOPHAGE) 500 MG tablet, Take 1 tablet (500 mg total) by mouth daily with breakfast.  Current Outpatient Medications (Cardiovascular):  .  atenolol (TENORMIN) 25 MG tablet, Take 80m (1 tablet) in the AM and 554m(2 tablets) in the PM .  furosemide (LASIX) 40 MG tablet, Take 1 tablet (40 mg total) by mouth 2 (two) times daily. .  simvastatin (ZOCOR) 40 MG tablet, Take 1 tablet (40 mg total) by mouth daily at 6 PM.  Current Outpatient Medications (Respiratory):  .  albuterol (VENTOLIN HFA) 108 (90 Base) MCG/ACT inhaler, Inhale 2 puffs into the lungs every 6 (six) hours as needed for wheezing or shortness of  breath. .  fluticasone (FLONASE) 50 MCG/ACT nasal spray, Place 2 sprays into both nostrils daily. . Marland Kitchenlevocetirizine (XYZAL) 5 MG tablet, Take 1 tablet (5 mg total) by mouth every evening. .  montelukast (SINGULAIR) 10 MG tablet, Take 1 tablet (10 mg total) by mouth at bedtime.  Current Outpatient Medications (Analgesics):  . Marland KitchenSUMAtriptan-naproxen (TREXIMET) 85-500 MG tablet, Take 1 tablet by mouth every 2 (two) hours as needed for migraine.   Current Outpatient  Medications (Other):  Marland Kitchen  ALPRAZolam (XANAX) 1 MG tablet, Take 1 mg by mouth as needed for anxiety. .  baclofen (LIORESAL) 10 MG tablet, TAKE ONE TABLET BY MOUTH TWICE A DAY AS NEEDED FOR MUSCLE SPASMS .  blood glucose meter kit and supplies KIT, Dispense based on patient and insurance preference. Use up to four times daily as directed. (FOR E11.9). Marland Kitchen  gabapentin (NEURONTIN) 100 MG capsule, Take 1 capsule (100 mg total) by mouth 3 (three) times daily. .  hydrocortisone (ANUSOL-HC) 2.5 % rectal cream, Place rectally as needed. .  hydrOXYzine (ATARAX/VISTARIL) 10 MG tablet, Take 1 tablet (10 mg total) by mouth 3 (three) times daily as needed. .  linaclotide (LINZESS) 290 MCG CAPS capsule, Take 1 capsule (290 mcg total) by mouth daily before breakfast. .  Multiple Vitamins-Minerals (MULTIVITAMIN WITH MINERALS) tablet, Take 1 tablet by mouth daily. .  NONFORMULARY OR COMPOUNDED ITEM, [facility]:  Antifungal - Terbinafine 3%, Fluconazole 2%, Tea Tree Oin 5%, Urea 10%, Ibuprofen 2% in DMSO #83m. Apply to the affected nail(s) once (at bedtime) or twice daily. .Marland Kitchen omeprazole (PRILOSEC) 20 MG capsule, Take 1 capsule (20 mg total) by mouth daily. .  ondansetron (ZOFRAN) 4 MG tablet, Take 1 tablet (4 mg total) by mouth every 8 (eight) hours as needed for nausea or vomiting. .  potassium chloride SA (KLOR-CON) 20 MEQ tablet, Take 1 tablet (20 mEq total) by mouth 2 (two) times daily. .Marland Kitchen tiZANidine (ZANAFLEX) 4 MG tablet, Take 1 tablet (4 mg  total) by mouth at bedtime. .Marland Kitchen venlafaxine XR (EFFEXOR-XR) 150 MG 24 hr capsule, Take 1 capsule (150 mg total) by mouth 2 (two) times daily. .Marland Kitchen VITAMIN D PO, Take 1 tablet by mouth daily. .  Vitamin D, Ergocalciferol, (DRISDOL) 1.25 MG (50000 UNIT) CAPS capsule, TAKE 1 CAPSULE (50,000 UNITS TOTAL) BY MOUTH EVERY 7 (SEVEN) DAYS.   Reviewed prior external information including notes and imaging from  primary care provider As well as notes that were available from care everywhere and other healthcare systems.  Past medical history, social, surgical and family history all reviewed in electronic medical record.  No pertanent information unless stated regarding to the chief complaint.   Review of Systems:  No headache, visual changes, nausea, vomiting, diarrhea, constipation, dizziness, abdominal pain, skin rash, fevers, chills, night sweats, weight loss, swollen lymph nodes, body aches, joint swelling, chest pain, shortness of breath, mood changes. POSITIVE muscle aches  Objective  Blood pressure 130/90, pulse 68, height '5\' 6"'  (1.676 m), weight 246 lb (111.6 kg), SpO2 97 %.   General: No apparent distress alert and oriented x3 mood and affect normal, dressed appropriately.  HEENT: Pupils equal, extraocular movements intact  Respiratory: Patient's speak in full sentences and does not appear short of breath  Cardiovascular: No lower extremity edema, non tender, no erythema  Neuro: Cranial nerves II through XII are intact, neurovascularly intact in all extremities with 2+ DTRs and 2+ pulses.  Gait antalgic Right knee shows the incision well healed.  Lacks the last 2 to 3 degrees of extension, patient has flexion to 90 degrees.  Patient does have some mild discomfort noted.  May be trace effusion noted.  No erythema or warmness of the knee noted.  Hip exams do show some mild decreased range of motion and mild tenderness to palpation over the greater trochanteric area but improved.  Tightness noted  over the lower back noted.  Negative straight leg test on the left.  Impression and Recommendations:     The above documentation has been reviewed and is accurate and complete Lyndal Pulley, DO       Note: This dictation was prepared with Dragon dictation along with smaller phrase technology. Any transcriptional errors that result from this process are unintentional.

## 2020-06-20 NOTE — Telephone Encounter (Signed)
Reviewed chart pt is up-to-date sent refills to mail order../lmb  

## 2020-06-20 NOTE — Telephone Encounter (Signed)
    1.Medication Requested:  atenolol (TENORMIN) 25 MG tablet fluticasone (FLONASE) 50 MCG/ACT nasal spray furosemide (LASIX) 40 MG tablet linaclotide (LINZESS) 290 MCG CAPS capsule metFORMIN (GLUCOPHAGE) 500 MG tablet omeprazole (PRILOSEC) 20 MG capsule ondansetron (ZOFRAN) 4 MG tablet potassium chloride SA (KLOR-CON) 20 MEQ tablet simvastatin (ZOCOR) 40 MG tablet venlafaxine XR (EFFEXOR-XR) 150 MG 24 hr capsule   2. Pharmacy (Name, [facility]): [facility] [place], [place] [address]  3. On Med List: yes  4. Last Visit with PCP: 06/02/20  5. Next visit date with PCP: 12/08/20   Agent: Please be advised that RX refills may take up to 3 business days. We ask that you follow-up with your pharmacy.

## 2020-06-22 DIAGNOSIS — R002 Palpitations: Secondary | ICD-10-CM

## 2020-06-23 NOTE — Telephone Encounter (Signed)
Patient needs all these medication sent to : [id number] [place], [place]. Phone:  [phone]  Fax:  [phone]     [facility]. She needs a 14 day supply sent.

## 2020-06-24 ENCOUNTER — Other Ambulatory Visit: Payer: Self-pay

## 2020-06-24 ENCOUNTER — Encounter: Payer: Self-pay | Admitting: Family Medicine

## 2020-06-24 ENCOUNTER — Ambulatory Visit (INDEPENDENT_AMBULATORY_CARE_PROVIDER_SITE_OTHER): Payer: Medicare Other

## 2020-06-24 ENCOUNTER — Ambulatory Visit (INDEPENDENT_AMBULATORY_CARE_PROVIDER_SITE_OTHER): Payer: Medicare Other | Admitting: Family Medicine

## 2020-06-24 VITALS — BP 130/90 | HR 68 | Ht 66.0 in | Wt 246.0 lb

## 2020-06-24 DIAGNOSIS — M25551 Pain in right hip: Secondary | ICD-10-CM | POA: Diagnosis not present

## 2020-06-24 DIAGNOSIS — M25561 Pain in right knee: Secondary | ICD-10-CM | POA: Diagnosis not present

## 2020-06-24 DIAGNOSIS — G8929 Other chronic pain: Secondary | ICD-10-CM

## 2020-06-24 DIAGNOSIS — M7061 Trochanteric bursitis, right hip: Secondary | ICD-10-CM | POA: Diagnosis not present

## 2020-06-24 DIAGNOSIS — J301 Allergic rhinitis due to pollen: Secondary | ICD-10-CM | POA: Diagnosis not present

## 2020-06-24 DIAGNOSIS — Z96651 Presence of right artificial knee joint: Secondary | ICD-10-CM

## 2020-06-24 MED ORDER — OMEPRAZOLE 20 MG PO CPDR: 20.0000 mg | DELAYED_RELEASE_CAPSULE | Freq: Every day | ORAL | 0 refills | Status: DC

## 2020-06-24 MED ORDER — LINACLOTIDE 290 MCG PO CAPS: 290.0000 ug | ORAL_CAPSULE | Freq: Every day | ORAL | 0 refills | Status: DC

## 2020-06-24 MED ORDER — METFORMIN HCL 500 MG PO TABS: 500.0000 mg | ORAL_TABLET | Freq: Every day | ORAL | 0 refills | Status: DC

## 2020-06-24 MED ORDER — POTASSIUM CHLORIDE CRYS ER 20 MEQ PO TBCR: 20.0000 meq | EXTENDED_RELEASE_TABLET | Freq: Two times a day (BID) | ORAL | 0 refills | Status: DC

## 2020-06-24 MED ORDER — VENLAFAXINE HCL ER 150 MG PO CP24: 150.0000 mg | ORAL_CAPSULE | Freq: Two times a day (BID) | ORAL | 0 refills | Status: DC

## 2020-06-24 MED ORDER — ATENOLOL 25 MG PO TABS: ORAL_TABLET | ORAL | 0 refills | Status: DC

## 2020-06-24 MED ORDER — MONTELUKAST SODIUM 10 MG PO TABS: 10.0000 mg | ORAL_TABLET | Freq: Every day | ORAL | 3 refills | Status: DC

## 2020-06-24 MED ORDER — FLUTICASONE PROPIONATE 50 MCG/ACT NA SUSP: 2.0000 | Freq: Every day | NASAL | 0 refills | Status: DC

## 2020-06-24 NOTE — Addendum Note (Signed)
Addended by: Earnstine Regal on: 06/24/2020 12:23 PM   Modules accepted: Orders

## 2020-06-24 NOTE — Assessment & Plan Note (Signed)
Much improved, no change in management.  Has had left-sided in the past as well

## 2020-06-24 NOTE — Assessment & Plan Note (Addendum)
Singulair could be helpful as well for this.  Patient is over the age of 34 the warning potential side effects.  I believe patient will do well and she can discuss medication with primary care provider as well will forward note as well to primary care to see if there is any other concerns.  Did discuss her not using any other over-the-counter antihistamines for now but can add them later.

## 2020-06-24 NOTE — Patient Instructions (Addendum)
Good to see you Hip xray today Call [facility] for brace fitting [phone] Singulair take at night if you notice mood changes discontinue but might help with knee pain. Can break in half.  See me again in 2-3 months

## 2020-06-24 NOTE — Assessment & Plan Note (Signed)
Chronic pain overall.  Patient still having pain on a regular basis.  Patient does have the bracing will be fitted in the near future.  Patient is going to also try Singulair.  Warned of potential side effects and she will discontinue immediately.  Do not see likely any intervention with the medications otherwise.  Has had some success with this with chronic inflammation secondary to replacement.  Follow-up with me again in 2 to 3 months

## 2020-06-24 NOTE — Telephone Encounter (Signed)
[facility]../lmb

## 2020-06-27 ENCOUNTER — Telehealth: Payer: Self-pay | Admitting: Internal Medicine

## 2020-06-27 MED ORDER — SIMVASTATIN 40 MG PO TABS: 40.0000 mg | ORAL_TABLET | Freq: Every day | ORAL | 0 refills | Status: DC

## 2020-06-27 MED ORDER — LEVOCETIRIZINE DIHYDROCHLORIDE 5 MG PO TABS: 5.0000 mg | ORAL_TABLET | Freq: Every evening | ORAL | 0 refills | Status: DC

## 2020-06-27 MED ORDER — FUROSEMIDE 40 MG PO TABS
40.0000 mg | ORAL_TABLET | Freq: Two times a day (BID) | ORAL | 0 refills | Status: DC
Start: 2020-06-27 — End: 2020-12-25

## 2020-06-27 NOTE — Telephone Encounter (Signed)
Patient called and said that she needs med refills on  furosemide (LASIX) 40 MG tablet levocetirizine (XYZAL) 5 MG tablet  simvastatin (ZOCOR) 40 MG tablet [facility] [address] She said that the only prescription that was there when she went to pick her meds up the other day were metFORMIN (GLUCOPHAGE) 500 MG tablet

## 2020-06-27 NOTE — Addendum Note (Signed)
Addended by: Earnstine Regal on: 06/27/2020 11:51 AM   Modules accepted: Orders

## 2020-06-27 NOTE — Telephone Encounter (Signed)
[facility]lmb

## 2020-06-27 NOTE — Telephone Encounter (Signed)
error 

## 2020-07-07 NOTE — Telephone Encounter (Signed)
Received notification that patient had not read my chart messages sent from office. I spoke with patient who reports for the last few weeks she has been waking up with palpitations. Not sure how long they last   Palpitations improve as the day goes on but she feels very tired. No chest pain or shortness of breath. Has been under a lot of stress. Limits caffeine intake and is not taking any decongestants.  She is due for follow up in office in November. Will forward to Dr Percival Spanish to see if she needs testing or sooner appointment.

## 2020-07-08 NOTE — Telephone Encounter (Signed)
Minus Breeding, MD     Please send her a three day Zio patch that hopefully will have results complete before I see her in Nov.     I placed call to patient and left message to call office

## 2020-07-08 NOTE — Telephone Encounter (Signed)
Left message to call back  

## 2020-07-09 NOTE — Telephone Encounter (Signed)
Spoke to pt. She is agreeable to [facility] monitor. Will order and follow up with message to Markus Daft and Waymon Budge.

## 2020-07-14 ENCOUNTER — Telehealth: Payer: Self-pay | Admitting: *Deleted

## 2020-07-14 NOTE — Telephone Encounter (Signed)
Patient received and attempted to start [place] [id number].  Monitor flashed orange quick succession indicated device malfunction.   [employer] will send the patient a replacement ZIO XT monitor.  She has been instructed to send initial monitor back to [facility].   [employer] will call patient to inform her another monitor is being shipped.

## 2020-07-25 ENCOUNTER — Other Ambulatory Visit: Payer: Self-pay

## 2020-07-25 ENCOUNTER — Ambulatory Visit
Admission: EM | Admit: 2020-07-25 | Discharge: 2020-07-25 | Disposition: A | Discharge status: Home / Self Care | Payer: Medicare Other | Attending: Physician Assistant | Admitting: Physician Assistant

## 2020-07-25 DIAGNOSIS — L299 Pruritus, unspecified: Secondary | ICD-10-CM | POA: Diagnosis not present

## 2020-07-25 MED ORDER — SARNA 0.5-0.5 % EX LOTN: 1.0000 "application " | TOPICAL_LOTION | CUTANEOUS | 0 refills | Status: DC | PRN

## 2020-07-25 MED ORDER — DEXAMETHASONE SODIUM PHOSPHATE 10 MG/ML IJ SOLN
10.0000 mg | Freq: Once | INTRAMUSCULAR | Status: AC
  Administered 2020-07-25: 10 mg via INTRAMUSCULAR

## 2020-07-25 MED ORDER — TRIAMCINOLONE ACETONIDE 0.1 % EX CREA: 1.0000 "application " | TOPICAL_CREAM | Freq: Two times a day (BID) | CUTANEOUS | 0 refills | Status: DC

## 2020-07-25 MED ORDER — HYDROXYZINE HCL 10 MG PO TABS: 10.0000 mg | ORAL_TABLET | Freq: Three times a day (TID) | ORAL | 0 refills | Status: DC | PRN

## 2020-07-25 NOTE — ED Triage Notes (Signed)
Pt c/o itchy rash all over x2 days and had chills last night. Pt states only change was she started singular at night x4 days.

## 2020-07-25 NOTE — Discharge Instructions (Signed)
Decadron in office today. Hydroxyzine as needed for itching. Triamcinolone to the elbow area. SARNA lotion as needed for itching. Ice compress. Follow up with PCP if itching not improving.

## 2020-07-25 NOTE — ED Provider Notes (Signed)
[facility]    CSN: [id number] Arrival date & time: 07/25/20  1419      History   Chief Complaint Chief Complaint  Patient presents with  . Rash    HPI Erica [name] is a 64 y.o. female.   64 year old female comes in for 2 day history of itching to the body. States first started to the left elbow, then to the right elbow, now itching throughout whole body. Denies any changes in hygiene products. Did start singulair recently, patient states for inflammation.      Past Medical History:  Diagnosis Date  . Abnormal stress test    a. 09/2016: NST showed a small defect of mild severity present in the basal inferoseptal and mid inferoseptal location, consistent with ischemia. --> medically managed  . ALLERGIC RHINITIS   . Anxiety   . Bursitis   . DDD (degenerative disc disease), lumbar    ESI with Ramos (spring 2016)  . Depression   . Diabetes mellitus without complication ([place])   . Dyslipidemia   . External hemorrhoids   . GERD (gastroesophageal reflux disease)   . Hypertension   . Obesity   . Osteoarthritis   . Palpitations    a. prior event monitor showing sinus tachycardia, no PAF.   Marland Kitchen Panic attacks    Hx of depression  . Sleep apnea    CPAP hs    Patient Active Problem List   Diagnosis Date Noted  . Greater trochanteric bursitis of right hip 05/05/2020  . 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 Chronic constipation 08/23/2019  . Abdominal pain 08/23/2019  . Bilateral carotid artery stenosis 06/04/2019  . Osteopenia 05/19/2019  . Sinus pressure 02/12/2019  . Acute pain of left knee 12/11/2018  . Class 2 severe obesity with serious comorbidity and body mass index (BMI) of 37.0 to 37.9 in adult ([place]) 08/31/2018  . Degeneration of lumbar intervertebral disc 05/30/2018  . Onychomycosis 12/07/2017  . Nausea without vomiting 12/07/2017  . Left hip pain 06/12/2017  . Acute left-sided low back pain without  sciatica 06/12/2017  . Meningioma ([place]) 03/28/2017  . Panic attacks 09/27/2016  . GERD (gastroesophageal reflux disease) 09/27/2016  . Right ankle pain 08/19/2016  . Diabetes ([place]) 02/15/2016  . Lump of skin 11/28/2015  . Migraine without aura and with status migrainosus, not intractable 10/03/2015  . Numbness of left hand 10/03/2015  . Cephalalgia 10/03/2015  . Palpitations 09/08/2012  . Sciatica of left side   . Peripheral edema 05/09/2012  . FATIGUE 02/23/2010  . Obstructive sleep apnea 10/31/2009  . External hemorrhoids 10/17/2009  . CONSTIPATION, CHRONIC 10/17/2009  . Hyperlipidemia 01/12/2008  . Depression, major, recurrent ([place]) 01/12/2008  . Essential hypertension, benign 01/12/2008  . Allergic rhinitis 01/12/2008    Past Surgical History:  Procedure Laterality Date  . ABDOMINAL HYSTERECTOMY    . MASS EXCISION Left 12/30/2015   Procedure: EXCISION LEFT LEG MASS;  Surgeon: Donnie Mesa, MD;  Location: [place];  Service: General;  Laterality: Left;  . REPLACEMENT TOTAL KNEE Right   . RIGHT OOPHORECTOMY     No cancer    OB History    Gravida  3   Para  3   Term  3   Preterm      AB      Living  3     SAB      TAB      Ectopic  Multiple      Live Births               Home Medications    Prior to Admission medications   Medication Sig Start Date End Date Taking? Authorizing Provider  albuterol (VENTOLIN HFA) 108 (90 Base) MCG/ACT inhaler Inhale 2 puffs into the lungs every 6 (six) hours as needed for wheezing or shortness of breath. 11/20/19   [name], MD  ALPRAZolam Duanne Moron) 1 MG tablet Take 1 mg by mouth as needed for anxiety.    [provider]  atenolol (TENORMIN) 25 MG tablet Take 10m (1 tablet) in the AM and 561m(2 tablets) in the PM 06/24/20   Burns, StClaudina LickMD  baclofen (LIORESAL) 10 MG tablet TAKE ONE TABLET BY MOUTH TWICE A DAY AS NEEDED FOR MUSCLE SPASMS 11/20/19   BuBinnie RailMD  blood glucose  meter kit and supplies KIT Dispense based on patient and insurance preference. Use up to four times daily as directed. (FOR E11.9). 09/14/19   BuBinnie RailMD  camphor-menthol (SBrown [facility] Hospitallotion Apply 1 application topically as needed for itching. 07/25/20   YuTasia CatchingsAmy V, PA-C  fluticasone (FLONASE) 50 MCG/ACT nasal spray Place 2 sprays into both nostrils daily. 06/24/20   BuBinnie RailMD  furosemide (LASIX) 40 MG tablet Take 1 tablet (40 mg total) by mouth 2 (two) times daily. 06/27/20   BuBinnie RailMD  gabapentin (NEURONTIN) 100 MG capsule Take 1 capsule (100 mg total) by mouth 3 (three) times daily. 08/16/19   ScRosemarie AxMD  hydrocortisone (ANUSOL-HC) 2.5 % rectal cream Place rectally as needed. 02/12/19   BuBinnie RailMD  hydrOXYzine (ATARAX/VISTARIL) 10 MG tablet Take 1 tablet (10 mg total) by mouth 3 (three) times daily as needed for itching. 07/25/20   YuTasia CatchingsAmy V, PA-C  levocetirizine (XYZAL) 5 MG tablet Take 1 tablet (5 mg total) by mouth every evening. 06/27/20   BuBinnie RailMD  linaclotide (LRolan Lipa290 MCG CAPS capsule Take 1 capsule (290 mcg total) by mouth daily before breakfast. 2 week supply until she receive mail order 06/24/20   BuBinnie RailMD  metFORMIN (GLUCOPHAGE) 500 MG tablet Take 1 tablet (500 mg total) by mouth daily with breakfast. 2 week supply until she receive mail order 06/24/20   BuBinnie RailMD  montelukast (SINGULAIR) 10 MG tablet Take 1 tablet (10 mg total) by mouth at bedtime. 06/24/20   SmLyndal PulleyDO  Multiple Vitamins-Minerals (MULTIVITAMIN WITH MINERALS) tablet Take 1 tablet by mouth daily.    [provider]  NONFORMULARY OR COMPOUNDED ITEM [facility]:  Antifungal - Terbinafine 3%, Fluconazole 2%, Tea Tree Oin 5%, Urea 10%, Ibuprofen 2% in DMSO #[id number] to the affected nail(s) once (at bedtime) or twice daily. 11/08/18   GalMarzetta BoardPM  omeprazole (PRILOSEC) 20 MG capsule Take 1 capsule (20 mg total) by mouth  daily. 2 week supply until she receive mail order 06/24/20   BurBinnie RailD  ondansetron (ZOFRAN) 4 MG tablet Take 1 tablet (4 mg total) by mouth every 8 (eight) hours as needed for nausea or vomiting. 11/20/19   BurBinnie RailD  potassium chloride SA (KLOR-CON) 20 MEQ tablet Take 1 tablet (20 mEq total) by mouth 2 (two) times daily. 2 week supply until she receive mail order 06/24/20   BurBinnie RailD  simvastatin (ZOCOR) 40 MG tablet Take 1 tablet (40  mg total) by mouth daily at 6 PM. 06/27/20   Burns, Claudina Lick, MD  SUMAtriptan-naproxen (TREXIMET) 85-500 MG tablet Take 1 tablet by mouth every 2 (two) hours as needed for migraine. 08/11/18   [name], MD  tiZANidine (ZANAFLEX) 4 MG tablet Take 1 tablet (4 mg total) by mouth at bedtime. 05/05/20   Lyndal Pulley, DO  triamcinolone cream (KENALOG) 0.1 % Apply 1 application topically 2 (two) times daily. 07/25/20   Tasia Catchings, Lenni Reckner V, PA-C  venlafaxine XR (EFFEXOR-XR) 150 MG 24 hr capsule Take 1 capsule (150 mg total) by mouth 2 (two) times daily. 06/24/20   [name], MD  VITAMIN D PO Take 1 tablet by mouth daily.    [provider]  Vitamin D, Ergocalciferol, (DRISDOL) 1.25 MG (50000 UNIT) CAPS capsule TAKE 1 CAPSULE (50,000 UNITS TOTAL) BY MOUTH EVERY 7 (SEVEN) DAYS. 06/02/20   Lyndal Pulley, DO  oxycodone (OXY-IR) 5 MG capsule Take 5 mg by mouth every 4 (four) hours as needed. For pain.  02/27/15  [provider]    Family History Family History  Problem Relation Age of Onset  . Heart disease Mother        Enlarged Heart, Pacemaker  . Diabetes Mother   . Thyroid disease Mother   . Hyperlipidemia Mother   . Hypertension Mother   . Sleep apnea Mother   . Dementia Father   . Kidney failure Father   . Prostate cancer Father   . Alcoholism Father   . Asthma Other   . Allergies Sister   . Asthma Son   . Breast cancer Maternal Aunt        cousin  . Colon cancer Cousin   . Heart disease Son        CHF, morbid  obesity  . Prostate cancer Maternal Uncle   . Diabetes Son     Social History Social History   Tobacco Use  . Smoking status: Never Smoker  . Smokeless tobacco: Never Used  . Tobacco comment: tried to as a teenager  Vaping Use  . Vaping Use: Never used  Substance Use Topics  . Alcohol use: Yes    Alcohol/week: 1.0 standard drink    Types: 1 Glasses of wine per week    Comment: one drink a month  . Drug use: No     Allergies   Seroquel [quetiapine fumarate], Codeine, Guaifenesin-codeine, and Wellbutrin [bupropion]   Review of Systems Review of Systems  Reason unable to perform ROS: See HPI as above.     Physical Exam Triage Vital Signs ED Triage Vitals  Enc Vitals Group     BP 07/25/20 1557 (!) 140/93     Pulse Rate 07/25/20 1557 73     Resp 07/25/20 1557 18     Temp 07/25/20 1557 98.7 F (37.1 C)     Temp Source 07/25/20 1557 Oral     SpO2 07/25/20 1557 98 %     Weight --      Height --      Head Circumference --      Peak Flow --      Pain Score 07/25/20 1558 0     Pain Loc --      Pain Edu? --      Excl. in [place]? --    No data found.  Updated Vital Signs BP (!) 140/93 (BP Location: Left Arm)   Pulse 73   Temp 98.7 F (37.1 C) (Oral)  Resp 18   SpO2 98%   Physical Exam Constitutional:      General: She is not in acute distress.    Appearance: Normal appearance. She is well-developed. She is not toxic-appearing or diaphoretic.  HENT:     Head: Normocephalic and atraumatic.  Eyes:     Conjunctiva/sclera: Conjunctivae normal.     Pupils: Pupils are equal, round, and reactive to light.  Pulmonary:     Effort: Pulmonary effort is normal. No respiratory distress.  Musculoskeletal:     Cervical back: Normal range of motion and neck supple.  Skin:    General: Skin is warm and dry.     Comments: Contusion to bilateral flexor elbow, likely from scratching. Few excoriations seen. Erythema with swelling to the flexor elbow without warmth or tenderness  to palpation. Patient with itching throughout the body, but no obvious rashes seen  Neurological:     Mental Status: She is alert and oriented to person, place, and time.      UC Treatments / Results  Labs (all labs ordered are listed, but only abnormal results are displayed) Labs Reviewed - No data to display  EKG   Radiology No results found.  Procedures Procedures (including critical care time)  Medications Ordered in UC Medications  dexamethasone (DECADRON) injection 10 mg (has no administration in time range)    Initial Impression / Assessment and Plan / UC Course  I have reviewed the triage vital signs and the nursing notes.  Pertinent labs & imaging results that were available during my care of the patient were reviewed by me and considered in my medical decision making (see chart for details).    Patient requesting corticosteroid injection due to whole body itching. Last a1c 6.3, will provide decadron injection. Hydroxizine, SARNA, triamcinolone as needed. Return precautions given.  Final Clinical Impressions(s) / UC Diagnoses   Final diagnoses:  Itching    ED Prescriptions    Medication Sig Dispense Auth. Provider   hydrOXYzine (ATARAX/VISTARIL) 10 MG tablet Take 1 tablet (10 mg total) by mouth 3 (three) times daily as needed for itching. 15 tablet Taylen Wendland V, PA-C   camphor-menthol [facility]) lotion Apply 1 application topically as needed for itching. 222 mL Gaelen Brager V, PA-C   triamcinolone cream (KENALOG) 0.1 % Apply 1 application topically 2 (two) times daily. 30 g Ok Edwards, PA-C     PDMP not reviewed this encounter.   Ok Edwards, PA-C 07/25/20 1633

## 2020-07-26 DIAGNOSIS — E119 Type 2 diabetes mellitus without complications: Secondary | ICD-10-CM | POA: Diagnosis not present

## 2020-08-04 ENCOUNTER — Telehealth: Payer: Self-pay | Admitting: Cardiology

## 2020-08-04 ENCOUNTER — Telehealth: Payer: Self-pay | Admitting: Internal Medicine

## 2020-08-04 NOTE — Telephone Encounter (Signed)
° ° °  Pt would like to speak with Dr. Rosezella [place] nurse. She wanted to know if Dr. Warren Lacy will put him on a different monitor since the last one she had a reaction with it

## 2020-08-04 NOTE — Telephone Encounter (Signed)
levocetirizine (XYZAL) 5 MG tablet  [facility] [address] Phone:  [phone]  Fax:  [phone]      On Med List: Y  Last appt: 8.23.21 Next appt: 2.28.21

## 2020-08-04 NOTE — Telephone Encounter (Signed)
Left message for patient to call back  

## 2020-08-05 ENCOUNTER — Other Ambulatory Visit: Payer: Self-pay

## 2020-08-05 DIAGNOSIS — J301 Allergic rhinitis due to pollen: Secondary | ICD-10-CM

## 2020-08-05 MED ORDER — LEVOCETIRIZINE DIHYDROCHLORIDE 5 MG PO TABS: 5.0000 mg | ORAL_TABLET | Freq: Every evening | ORAL | 0 refills | Status: DC

## 2020-08-05 NOTE — Telephone Encounter (Signed)
Sent it today

## 2020-08-14 NOTE — Telephone Encounter (Signed)
Message sent to [facility] at [facility] to see if we can have enrollement for ZIO XT cancelled since patient unable to achieve greater than 12 hour wear time on monitor with 3 attempts made.

## 2020-08-14 NOTE — Telephone Encounter (Signed)
Spoke with patient and she has tried 3 different zio monitors and they have all fallen off during the night secondary to hot flashes during the night.  [employer] can not send her another monitor per their policy. Patient is unsure but does not think she has had any of them on for 12 hours. Discussed with Darrick Penna and she will reach out to [facility] rep to see if there is any way patient does not get billed for the monitor secondary to lack of data. Advised patient would be in touch and possibly order an alternate monitor

## 2020-08-18 ENCOUNTER — Ambulatory Visit (INDEPENDENT_AMBULATORY_CARE_PROVIDER_SITE_OTHER): Payer: Medicare Other

## 2020-08-18 ENCOUNTER — Other Ambulatory Visit: Payer: Self-pay

## 2020-08-18 DIAGNOSIS — R002 Palpitations: Secondary | ICD-10-CM

## 2020-08-25 ENCOUNTER — Other Ambulatory Visit: Payer: Self-pay

## 2020-08-25 ENCOUNTER — Ambulatory Visit: Payer: Self-pay

## 2020-08-25 ENCOUNTER — Ambulatory Visit (INDEPENDENT_AMBULATORY_CARE_PROVIDER_SITE_OTHER): Payer: Medicare Other | Admitting: Family Medicine

## 2020-08-25 ENCOUNTER — Encounter: Payer: Self-pay | Admitting: Family Medicine

## 2020-08-25 VITALS — BP 130/78 | HR 74 | Ht 66.0 in

## 2020-08-25 DIAGNOSIS — M25552 Pain in left hip: Secondary | ICD-10-CM | POA: Diagnosis not present

## 2020-08-25 DIAGNOSIS — R002 Palpitations: Secondary | ICD-10-CM | POA: Diagnosis not present

## 2020-08-25 DIAGNOSIS — M25551 Pain in right hip: Secondary | ICD-10-CM | POA: Diagnosis not present

## 2020-08-25 DIAGNOSIS — M5432 Sciatica, left side: Secondary | ICD-10-CM | POA: Diagnosis not present

## 2020-08-25 DIAGNOSIS — M7061 Trochanteric bursitis, right hip: Secondary | ICD-10-CM | POA: Insufficient documentation

## 2020-08-25 DIAGNOSIS — M7062 Trochanteric bursitis, left hip: Secondary | ICD-10-CM | POA: Insufficient documentation

## 2020-08-25 DIAGNOSIS — M5136 Other intervertebral disc degeneration, lumbar region: Secondary | ICD-10-CM

## 2020-08-25 NOTE — Assessment & Plan Note (Signed)
Bilateral injections given today.  Tolerated procedure well.  I am concerned that patient is having more of a lumbar radiculopathy and I do feel that the epidural will be more beneficial.  Patient does have Zanaflex and can take 1 pill at night.  Discussed with her not to take it with the baclofen which patient states she does not have.  Follow-up with me again 2 months

## 2020-08-25 NOTE — Progress Notes (Signed)
[place] [address] [place] Phone: [phone] Subjective:   I Erica [name] am serving as a Education administrator for Dr. [name].  This visit occurred during the SARS-CoV-2 public health emergency.  Safety protocols were in place, including screening questions prior to the visit, additional usage of staff PPE, and extensive cleaning of exam room while observing appropriate contact time as indicated for disinfecting solutions.   I'm seeing this patient by the request  of:  [name], MD  CC: Low back follow-up, hip pain follow-up  [id number]   06/24/2020 Much improved, no change in management.  Has had left-sided in the past as well   Update 08/25/2020 Erica [name] is a 64 y.o. female coming in with complaint of right hip pain. Patient states that her hips are hurting bilaterally. Back and right knee pain as well. States she has been having back spasms. States the right knee is still bothering her.  Patient states that is both sides.  Has not had the pain on the hips on both sides.  Recently has had it on the right side and seems to be favoring the knee but still has instability.  Has not been able to get the brace yet for the knee.  Patient is bone scan of the knee though did not show any true loosening.  Patient feels like that is starting to give her more trouble in the back and does have more of the sciatica down the left leg.  This responded previously to an injection back in 2020.      Past Medical History:  Diagnosis Date   Abnormal stress test    a. 09/2016: NST showed a small defect of mild severity present in the basal inferoseptal and mid inferoseptal location, consistent with ischemia. --> medically managed   ALLERGIC RHINITIS    Anxiety    Bursitis    DDD (degenerative disc disease), lumbar    ESI with Ramos (spring 2016)   Depression    Diabetes mellitus without complication (HCC)    Dyslipidemia     External hemorrhoids    GERD (gastroesophageal reflux disease)    Hypertension    Obesity    Osteoarthritis    Palpitations    a. prior event monitor showing sinus tachycardia, no PAF.    Panic attacks    Hx of depression   Sleep apnea    CPAP hs   Past Surgical History:  Procedure Laterality Date   ABDOMINAL HYSTERECTOMY     MASS EXCISION Left 12/30/2015   Procedure: EXCISION LEFT LEG MASS;  Surgeon: Donnie Mesa, MD;  Location: [facility];  Service: General;  Laterality: Left;   REPLACEMENT TOTAL KNEE Right    RIGHT OOPHORECTOMY     No cancer   Social History   Socioeconomic History   Marital status: Married    Spouse name: [name]   Number of children: 3   Years of education: Not on file   Highest education level: Not on file  Occupational History   Occupation: Disabled  Tobacco Use   Smoking status: Never Smoker   Smokeless tobacco: Never Used   Tobacco comment: tried to as a teenager  Scientific laboratory technician Use: Never used  Substance and Sexual Activity   Alcohol use: Yes    Alcohol/week: 1.0 standard drink    Types: 1 Glasses of wine per week    Comment: one drink a month   Drug  use: No   Sexual activity: Not Currently  Other Topics Concern   Not on file  Social History Narrative   Married with children. Pt is on disablity. Previous worked in the school system   Social Determinants of Radio broadcast assistant Strain:    Difficulty of Paying Living Expenses: Not on file  Food Insecurity:    Worried About Charity fundraiser in the Last Year: Not on file   [employer] of Food in the Last Year: Not on file  Transportation Needs:    Film/video editor (Medical): Not on file   Lack of Transportation (Non-Medical): Not on file  Physical Activity:    Days of Exercise per Week: Not on file   Minutes of Exercise per Session: Not on file  Stress:    Feeling of Stress : Not on file  Social Connections:     Frequency of Communication with Friends and Family: Not on file   Frequency of Social Gatherings with Friends and Family: Not on file   Attends Religious Services: Not on file   Active Member of Clubs or Organizations: Not on file   Attends Archivist Meetings: Not on file   Marital Status: Not on file   Allergies  Allergen Reactions   Seroquel [Quetiapine Fumarate]     Hallucinations, palpitations   Codeine Nausea And Vomiting   Guaifenesin-Codeine Other (See Comments)    REACTION: feels spacey   Wellbutrin [Bupropion] Palpitations    hallucinations   Family History  Problem Relation Age of Onset   Heart disease Mother        Enlarged Heart, Pacemaker   Diabetes Mother    Thyroid disease Mother    Hyperlipidemia Mother    Hypertension Mother    Sleep apnea Mother    Dementia Father    Kidney failure Father    Prostate cancer Father    Alcoholism Father    Asthma Other    Allergies Sister    Asthma Son    Breast cancer Maternal Aunt        cousin   Colon cancer Cousin    Heart disease Son        CHF, morbid obesity   Prostate cancer Maternal Uncle    Diabetes Son     Current Outpatient Medications (Endocrine & Metabolic):    metFORMIN (GLUCOPHAGE) 500 MG tablet, Take 1 tablet (500 mg total) by mouth daily with breakfast. 2 week supply until she receive mail order  Current Outpatient Medications (Cardiovascular):    atenolol (TENORMIN) 25 MG tablet, Take 76m (1 tablet) in the AM and 540m(2 tablets) in the PM   furosemide (LASIX) 40 MG tablet, Take 1 tablet (40 mg total) by mouth 2 (two) times daily.   simvastatin (ZOCOR) 40 MG tablet, Take 1 tablet (40 mg total) by mouth daily at 6 PM.  Current Outpatient Medications (Respiratory):    albuterol (VENTOLIN HFA) 108 (90 Base) MCG/ACT inhaler, Inhale 2 puffs into the lungs every 6 (six) hours as needed for wheezing or shortness of breath.   fluticasone (FLONASE) 50 MCG/ACT  nasal spray, Place 2 sprays into both nostrils daily.   levocetirizine (XYZAL) 5 MG tablet, Take 1 tablet (5 mg total) by mouth every evening.   montelukast (SINGULAIR) 10 MG tablet, Take 1 tablet (10 mg total) by mouth at bedtime.  Current Outpatient Medications (Analgesics):    SUMAtriptan-naproxen (TREXIMET) 85-500 MG tablet, Take 1 tablet by mouth every 2 (two)  hours as needed for migraine.   Current Outpatient Medications (Other):    ALPRAZolam (XANAX) 1 MG tablet, Take 1 mg by mouth as needed for anxiety.   baclofen (LIORESAL) 10 MG tablet, TAKE ONE TABLET BY MOUTH TWICE A DAY AS NEEDED FOR MUSCLE SPASMS   blood glucose meter kit and supplies KIT, Dispense based on patient and insurance preference. Use up to four times daily as directed. (FOR E11.9).   camphor-menthol (SARNA) lotion, Apply 1 application topically as needed for itching.   gabapentin (NEURONTIN) 100 MG capsule, Take 1 capsule (100 mg total) by mouth 3 (three) times daily.   hydrocortisone (ANUSOL-HC) 2.5 % rectal cream, Place rectally as needed.   hydrOXYzine (ATARAX/VISTARIL) 10 MG tablet, Take 1 tablet (10 mg total) by mouth 3 (three) times daily as needed for itching.   linaclotide (LINZESS) 290 MCG CAPS capsule, Take 1 capsule (290 mcg total) by mouth daily before breakfast. 2 week supply until she receive mail order   Multiple Vitamins-Minerals (MULTIVITAMIN WITH MINERALS) tablet, Take 1 tablet by mouth daily.   NONFORMULARY OR COMPOUNDED ITEM, [facility]:  Antifungal - Terbinafine 3%, Fluconazole 2%, Tea Tree Oin 5%, Urea 10%, Ibuprofen 2% in DMSO #28m. Apply to the affected nail(s) once (at bedtime) or twice daily.   omeprazole (PRILOSEC) 20 MG capsule, Take 1 capsule (20 mg total) by mouth daily. 2 week supply until she receive mail order   ondansetron (ZOFRAN) 4 MG tablet, Take 1 tablet (4 mg total) by mouth every 8 (eight) hours as needed for nausea or vomiting.   potassium chloride SA  (KLOR-CON) 20 MEQ tablet, Take 1 tablet (20 mEq total) by mouth 2 (two) times daily. 2 week supply until she receive mail order   tiZANidine (ZANAFLEX) 4 MG tablet, Take 1 tablet (4 mg total) by mouth at bedtime.   triamcinolone cream (KENALOG) 0.1 %, Apply 1 application topically 2 (two) times daily.   venlafaxine XR (EFFEXOR-XR) 150 MG 24 hr capsule, Take 1 capsule (150 mg total) by mouth 2 (two) times daily.   VITAMIN D PO, Take 1 tablet by mouth daily.   Vitamin D, Ergocalciferol, (DRISDOL) 1.25 MG (50000 UNIT) CAPS capsule, TAKE 1 CAPSULE (50,000 UNITS TOTAL) BY MOUTH EVERY 7 (SEVEN) DAYS.   Reviewed prior external information including notes and imaging from  primary care provider As well as notes that were available from care everywhere and other healthcare systems.  Past medical history, social, surgical and family history all reviewed in electronic medical record.  No pertanent information unless stated regarding to the chief complaint.   Review of Systems:  No headache, visual changes, nausea, vomiting, diarrhea, constipation, dizziness, abdominal pain, skin rash, fevers, chills, night sweats, weight loss, swollen lymph nodes,  joint swelling, chest pain, shortness of breath, mood changes. POSITIVE muscle aches, body aches  Objective  Blood pressure 130/78, pulse 74, height '5\' 6"'  (1.676 m), SpO2 97 %.   General: No apparent distress alert and oriented x3 mood and affect normal, dressed appropriately.  HEENT: Pupils equal, extraocular movements intact  Respiratory: Patient's speak in full sentences and does not appear short of breath  Cardiovascular: No lower extremity edema, non tender, no erythema  Antalgic gait noted.  Patient does have severe tenderness to palpation over the greater trochanteric area bilaterally right greater than left.  Patient does have tightness with FABER test.  Still having difficulty with the right knee noted. Low back exam does have some tenderness  to palpation.  Mild increase  in radicular symptoms on the left leg with straight leg test but no significant weakness.   Procedure: Real-time Ultrasound Guided Injection of right greater trochanteric bursitis secondary to patient's body habitus Device: GE Logiq Q7 Ultrasound guided injection is preferred based studies that show increased duration, increased effect, greater accuracy, decreased procedural pain, increased response rate, and decreased cost with ultrasound guided versus blind injection.  Verbal informed consent obtained.  Time-out conducted.  Noted no overlying erythema, induration, or other signs of local infection.  Skin prepped in a sterile fashion.  Local anesthesia: Topical Ethyl chloride.  With sterile technique and under real time ultrasound guidance:  Greater trochanteric area was visualized and patient's bursa was noted. A 22-gauge 3 inch needle was inserted and 4 cc of 0.5% Marcaine and 1 cc of Kenalog 40 mg/dL was injected. Pictures taken Completed without difficulty  Pain immediately improved suggesting accurate placement of the medication.  Advised to call if fevers/chills, erythema, induration, drainage, or persistent bleeding.  Impression: Technically successful ultrasound guided injection.   Procedure: Real-time Ultrasound Guided Injection of left  greater trochanteric bursitis secondary to patient's body habitus Device: GE Logiq Q7  Ultrasound guided injection is preferred based studies that show increased duration, increased effect, greater accuracy, decreased procedural pain, increased response rate, and decreased cost with ultrasound guided versus blind injection.  Verbal informed consent obtained.  Time-out conducted.  Noted no overlying erythema, induration, or other signs of local infection.  Skin prepped in a sterile fashion.  Local anesthesia: Topical Ethyl chloride.  With sterile technique and under real time ultrasound guidance:  Greater trochanteric  area was visualized and patient's bursa was noted. A 22-gauge 3 inch needle was inserted and 4 cc of 0.5% Marcaine and 1 cc of Kenalog 40 mg/dL was injected. Pictures taken Completed without difficulty  Pain immediately improved suggesting accurate placement of the medication.  Advised to call if fevers/chills, erythema, induration, drainage, or persistent bleeding.  Impression: Technically successful ultrasound guided injection.    Impression and Recommendations:     The above documentation has been reviewed and is accurate and complete Lyndal Pulley, DO

## 2020-08-25 NOTE — Patient Instructions (Addendum)
Good to see you Call [facility] about your brace [phone]  Epidural ordered call [facility] to schedule [phone] Injections today See me again in 4-6 weeks after epidural

## 2020-08-25 NOTE — Assessment & Plan Note (Signed)
Worsening sciatica on the left side.  Patient given greater trochanteric injections to see if this would be beneficial.  Discussed icing regimen and home exercises.  Patient will have the epidural again which patient responded well to in 2020.  Patient will follow up with me again in 6 weeks.

## 2020-08-28 ENCOUNTER — Telehealth: Payer: Self-pay | Admitting: *Deleted

## 2020-08-28 ENCOUNTER — Other Ambulatory Visit: Payer: Self-pay

## 2020-08-28 MED ORDER — TIZANIDINE HCL 4 MG PO TABS
4.0000 mg | ORAL_TABLET | Freq: Every day | ORAL | 1 refills | Status: DC
Start: 2020-08-28 — End: 2020-09-23

## 2020-08-28 NOTE — Telephone Encounter (Signed)
Rx filled. Patient notified.  

## 2020-08-28 NOTE — Telephone Encounter (Signed)
Pt called requesting a refill for zanaflex to be sent into the [facility] pharmacy.

## 2020-09-02 ENCOUNTER — Other Ambulatory Visit: Payer: Self-pay

## 2020-09-02 ENCOUNTER — Ambulatory Visit
Admission: RE | Admit: 2020-09-02 | Discharge: 2020-09-02 | Disposition: A | Discharge status: Home / Self Care | Payer: Medicare Other | Source: Ambulatory Visit | Attending: Family Medicine | Admitting: Family Medicine

## 2020-09-02 DIAGNOSIS — M5136 Other intervertebral disc degeneration, lumbar region: Secondary | ICD-10-CM

## 2020-09-02 DIAGNOSIS — M47817 Spondylosis without myelopathy or radiculopathy, lumbosacral region: Secondary | ICD-10-CM | POA: Diagnosis not present

## 2020-09-02 MED ORDER — IOPAMIDOL (ISOVUE-M 200) INJECTION 41%
1.0000 mL | Freq: Once | INTRAMUSCULAR | Status: AC
  Administered 2020-09-02: 1 mL via EPIDURAL

## 2020-09-02 MED ORDER — METHYLPREDNISOLONE ACETATE 40 MG/ML INJ SUSP (RADIOLOG
120.0000 mg | Freq: Once | INTRAMUSCULAR | Status: AC
  Administered 2020-09-02: 120 mg via EPIDURAL

## 2020-09-02 NOTE — Discharge Instructions (Signed)

## 2020-09-03 ENCOUNTER — Other Ambulatory Visit: Payer: Self-pay | Admitting: Family Medicine

## 2020-09-12 ENCOUNTER — Emergency Department (HOSPITAL_COMMUNITY)
Admission: EM | Admit: 2020-09-12 | Discharge: 2020-09-12 | Disposition: A | Discharge status: Home / Self Care | Payer: Medicare Other | Attending: Emergency Medicine | Admitting: Emergency Medicine

## 2020-09-12 ENCOUNTER — Encounter (HOSPITAL_COMMUNITY): Payer: Self-pay

## 2020-09-12 ENCOUNTER — Other Ambulatory Visit: Payer: Self-pay

## 2020-09-12 ENCOUNTER — Emergency Department (HOSPITAL_COMMUNITY): Payer: Medicare Other

## 2020-09-12 DIAGNOSIS — E161 Other hypoglycemia: Secondary | ICD-10-CM | POA: Diagnosis not present

## 2020-09-12 DIAGNOSIS — Z79899 Other long term (current) drug therapy: Secondary | ICD-10-CM | POA: Insufficient documentation

## 2020-09-12 DIAGNOSIS — I10 Essential (primary) hypertension: Secondary | ICD-10-CM | POA: Insufficient documentation

## 2020-09-12 DIAGNOSIS — Y9389 Activity, other specified: Secondary | ICD-10-CM | POA: Insufficient documentation

## 2020-09-12 DIAGNOSIS — E119 Type 2 diabetes mellitus without complications: Secondary | ICD-10-CM | POA: Insufficient documentation

## 2020-09-12 DIAGNOSIS — Z7984 Long term (current) use of oral hypoglycemic drugs: Secondary | ICD-10-CM | POA: Diagnosis not present

## 2020-09-12 DIAGNOSIS — Z96651 Presence of right artificial knee joint: Secondary | ICD-10-CM | POA: Diagnosis not present

## 2020-09-12 DIAGNOSIS — S7001XA Contusion of right hip, initial encounter: Secondary | ICD-10-CM | POA: Diagnosis not present

## 2020-09-12 DIAGNOSIS — Y92410 Unspecified street and highway as the place of occurrence of the external cause: Secondary | ICD-10-CM | POA: Insufficient documentation

## 2020-09-12 DIAGNOSIS — R52 Pain, unspecified: Secondary | ICD-10-CM | POA: Diagnosis not present

## 2020-09-12 DIAGNOSIS — M25551 Pain in right hip: Secondary | ICD-10-CM | POA: Diagnosis not present

## 2020-09-12 DIAGNOSIS — R6889 Other general symptoms and signs: Secondary | ICD-10-CM | POA: Diagnosis not present

## 2020-09-12 DIAGNOSIS — R519 Headache, unspecified: Secondary | ICD-10-CM | POA: Diagnosis not present

## 2020-09-12 DIAGNOSIS — Z743 Need for continuous supervision: Secondary | ICD-10-CM | POA: Diagnosis not present

## 2020-09-12 DIAGNOSIS — S0003XA Contusion of scalp, initial encounter: Secondary | ICD-10-CM | POA: Diagnosis not present

## 2020-09-12 DIAGNOSIS — M25561 Pain in right knee: Secondary | ICD-10-CM | POA: Diagnosis not present

## 2020-09-12 DIAGNOSIS — S8001XA Contusion of right knee, initial encounter: Secondary | ICD-10-CM | POA: Insufficient documentation

## 2020-09-12 DIAGNOSIS — E162 Hypoglycemia, unspecified: Secondary | ICD-10-CM | POA: Diagnosis not present

## 2020-09-12 MED ORDER — HYDROCODONE-ACETAMINOPHEN 5-325 MG PO TABS
1.0000 | ORAL_TABLET | Freq: Once | ORAL | Status: AC
  Administered 2020-09-12: 1 via ORAL
  Filled 2020-09-12: qty 1

## 2020-09-12 MED ORDER — HYDROCODONE-ACETAMINOPHEN 5-325 MG PO TABS
1.0000 | ORAL_TABLET | ORAL | 0 refills | Status: DC | PRN
Start: 2020-09-12 — End: 2021-01-22

## 2020-09-12 MED ORDER — IBUPROFEN 800 MG PO TABS
800.0000 mg | ORAL_TABLET | Freq: Once | ORAL | Status: AC
  Administered 2020-09-12: 800 mg via ORAL
  Filled 2020-09-12: qty 1

## 2020-09-12 NOTE — ED Triage Notes (Signed)
Pt BIB EMS from [facility]. Pt reports hurting head on the seat in front of her. Pt now endorses head pain. Pt denies N/V and taking blood thinners.

## 2020-09-12 NOTE — Discharge Instructions (Addendum)
Wear your seat belt when your car is moving.
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
## 2020-09-14 ENCOUNTER — Emergency Department (HOSPITAL_BASED_OUTPATIENT_CLINIC_OR_DEPARTMENT_OTHER)
Admission: EM | Admit: 2020-09-14 | Discharge: 2020-09-14 | Disposition: A | Discharge status: Home / Self Care | Payer: Medicare Other | Attending: Emergency Medicine | Admitting: Emergency Medicine

## 2020-09-14 ENCOUNTER — Encounter (HOSPITAL_BASED_OUTPATIENT_CLINIC_OR_DEPARTMENT_OTHER): Payer: Self-pay | Admitting: *Deleted

## 2020-09-14 ENCOUNTER — Other Ambulatory Visit: Payer: Self-pay

## 2020-09-14 ENCOUNTER — Emergency Department (HOSPITAL_BASED_OUTPATIENT_CLINIC_OR_DEPARTMENT_OTHER): Payer: Medicare Other

## 2020-09-14 DIAGNOSIS — I10 Essential (primary) hypertension: Secondary | ICD-10-CM | POA: Insufficient documentation

## 2020-09-14 DIAGNOSIS — Z7984 Long term (current) use of oral hypoglycemic drugs: Secondary | ICD-10-CM | POA: Insufficient documentation

## 2020-09-14 DIAGNOSIS — S8011XA Contusion of right lower leg, initial encounter: Secondary | ICD-10-CM

## 2020-09-14 DIAGNOSIS — R002 Palpitations: Secondary | ICD-10-CM | POA: Diagnosis not present

## 2020-09-14 DIAGNOSIS — S8012XA Contusion of left lower leg, initial encounter: Secondary | ICD-10-CM | POA: Diagnosis not present

## 2020-09-14 DIAGNOSIS — E119 Type 2 diabetes mellitus without complications: Secondary | ICD-10-CM | POA: Diagnosis not present

## 2020-09-14 DIAGNOSIS — Y92410 Unspecified street and highway as the place of occurrence of the external cause: Secondary | ICD-10-CM | POA: Diagnosis not present

## 2020-09-14 DIAGNOSIS — Z79899 Other long term (current) drug therapy: Secondary | ICD-10-CM | POA: Diagnosis not present

## 2020-09-14 DIAGNOSIS — S8991XA Unspecified injury of right lower leg, initial encounter: Secondary | ICD-10-CM | POA: Diagnosis present
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
## 2020-09-14 NOTE — Discharge Instructions (Addendum)
Follow up with your cardiologist as scheduled. Your x-ray today is normal.
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
## 2020-09-18 DIAGNOSIS — I6529 Occlusion and stenosis of unspecified carotid artery: Secondary | ICD-10-CM | POA: Insufficient documentation

## 2020-09-18 DIAGNOSIS — R9439 Abnormal result of other cardiovascular function study: Secondary | ICD-10-CM | POA: Insufficient documentation

## 2020-09-18 NOTE — Progress Notes (Signed)
Cardiology Office Note   Date:  09/19/2020   ID:  Erica [name], DOB 27-Aug-1956, MRN [id number]  PCP:  [name], MD  Cardiologist:   No primary care provider on file.  Chief Complaint  Patient presents with  . Palpitations     History of Present Illness:  Erica [name] is a 64 y.o. female who presents for follow up of palpitations.  She was in the emergency room on 7/31 for this.  It turns out there was a medication error.  She had run out of Xanax and was actually taking Seroquel.  Her EKG was unremarkable.  High-sensitivity troponin was normal.  There was not thought to be any cardiac etiology.   She was in the ED on 11/11 with dyspnea.  I reviewed these records for this visit.    CXR was normal.  BNP was normal.    After our last visit she had palpitations.  She wore a monitor but I could not get access to results of this.  This was placed because she was having tachypalpitations.  She says she has panic.  She has lots of problems with anxiety.  She saw a therapist for this.  She does take medications.  She describes rapid heart rates that have been sporadically.  It is probably a little less now than it was previously.  She is not describing any chest pressure, neck or arm discomfort.  She is not having any new presyncope or syncope.  She has no PND or orthopnea.  She has had no weight gain or edema.   Past Medical History:  Diagnosis Date  . Abnormal stress test    a. 09/2016: NST showed a small defect of mild severity present in the basal inferoseptal and mid inferoseptal location, consistent with ischemia. --> medically managed  . ALLERGIC RHINITIS   . Anxiety   . Bursitis   . DDD (degenerative disc disease), lumbar    ESI with Ramos (spring 2016)  . Depression   . Diabetes mellitus without complication ([place])   . Dyslipidemia   . External hemorrhoids   . GERD (gastroesophageal reflux disease)   . Hypertension   . Obesity   . Osteoarthritis   .  Palpitations    a. prior event monitor showing sinus tachycardia, no PAF.   Marland Kitchen Panic attacks    Hx of depression  . Sleep apnea    CPAP hs    Past Surgical History:  Procedure Laterality Date  . ABDOMINAL HYSTERECTOMY    . MASS EXCISION Left 12/30/2015   Procedure: EXCISION LEFT LEG MASS;  Surgeon: Donnie Mesa, MD;  Location: [facility];  Service: General;  Laterality: Left;  . REPLACEMENT TOTAL KNEE Right   . RIGHT OOPHORECTOMY     No cancer     Current Outpatient Medications  Medication Sig Dispense Refill  . albuterol (VENTOLIN HFA) 108 (90 Base) MCG/ACT inhaler Inhale 2 puffs into the lungs every 6 (six) hours as needed for wheezing or shortness of breath. 16 g 3  . ALPRAZolam (XANAX) 1 MG tablet Take 1 mg by mouth as needed for anxiety.    Marland Kitchen atenolol (TENORMIN) 25 MG tablet Take 58m (1 tablet) in the AM and 537m(2 tablets) in the PM 14 tablet 0  . blood glucose meter kit and supplies KIT Dispense based on patient and insurance preference. Use up to four times daily as directed. (FOR E11.9). 1 each 0  . camphor-menthol (SARNA) lotion  Apply 1 application topically as needed for itching. 222 mL 0  . fluticasone (FLONASE) 50 MCG/ACT nasal spray Place 2 sprays into both nostrils daily. 16 g 0  . furosemide (LASIX) 40 MG tablet Take 1 tablet (40 mg total) by mouth 2 (two) times daily. 28 tablet 0  . HYDROcodone-acetaminophen (NORCO/VICODIN) 5-325 MG tablet Take 1 tablet by mouth every 4 (four) hours as needed. 10 tablet 0  . hydrocortisone (ANUSOL-HC) 2.5 % rectal cream Place rectally as needed. 30 g 3  . hydrOXYzine (ATARAX/VISTARIL) 10 MG tablet Take 1 tablet (10 mg total) by mouth 3 (three) times daily as needed for itching. 15 tablet 0  . levocetirizine (XYZAL) 5 MG tablet Take 1 tablet (5 mg total) by mouth every evening. 14 tablet 0  . linaclotide (LINZESS) 290 MCG CAPS capsule Take 1 capsule (290 mcg total) by mouth daily before breakfast. 2 week supply until  she receive mail order 14 capsule 0  . metFORMIN (GLUCOPHAGE) 500 MG tablet Take 1 tablet (500 mg total) by mouth daily with breakfast. 2 week supply until she receive mail order 14 tablet 0  . Multiple Vitamins-Minerals (MULTIVITAMIN WITH MINERALS) tablet Take 1 tablet by mouth daily.    Marland Kitchen omeprazole (PRILOSEC) 20 MG capsule Take 1 capsule (20 mg total) by mouth daily. 2 week supply until she receive mail order 14 capsule 0  . ondansetron (ZOFRAN) 4 MG tablet Take 1 tablet (4 mg total) by mouth every 8 (eight) hours as needed for nausea or vomiting. 40 tablet 0  . potassium chloride SA (KLOR-CON) 20 MEQ tablet Take 1 tablet (20 mEq total) by mouth 2 (two) times daily. 2 week supply until she receive mail order (Patient taking differently: Take 20 mEq by mouth daily. 1 tablet by mouth daily) 14 tablet 0  . SUMAtriptan-naproxen (TREXIMET) 85-500 MG tablet Take 1 tablet by mouth every 2 (two) hours as needed for migraine. 10 tablet 8  . tiZANidine (ZANAFLEX) 4 MG tablet Take 1 tablet (4 mg total) by mouth at bedtime. 30 tablet 1  . venlafaxine XR (EFFEXOR-XR) 150 MG 24 hr capsule Take 1 capsule (150 mg total) by mouth 2 (two) times daily. 14 capsule 0  . Vitamin D, Ergocalciferol, (DRISDOL) 1.25 MG (50000 UNIT) CAPS capsule TAKE 1 CAPSULE (50,000 UNITS TOTAL) BY MOUTH EVERY 7 (SEVEN) DAYS. 12 capsule 0  . propranolol (INDERAL) 10 MG tablet Take 1 tablet (10 mg total) by mouth 3 (three) times daily as needed (PALPITATIONS). 60 tablet 3  . rosuvastatin (CRESTOR) 20 MG tablet Take 1 tablet (20 mg total) by mouth daily. 90 tablet 3   No current facility-administered medications for this visit.    Allergies:   Seroquel [quetiapine fumarate], Codeine, Guaifenesin-codeine, and Wellbutrin [bupropion]    ROS:  Please see the history of present illness.   Otherwise, review of systems are positive for none.  All other systems are reviewed and negative.    PHYSICAL EXAM: VS:  BP 136/78   Pulse 74   Ht 5'  6" (1.676 m)   Wt 248 lb (112.5 kg)   BMI 40.03 kg/m  , BMI Body mass index is 40.03 kg/m. GENERAL:  Well appearing NECK:  No jugular venous distention, waveform within normal limits, carotid upstroke brisk and symmetric, no bruits, no thyromegaly LUNGS:  Clear to auscultation bilaterally CHEST:  Unremarkable HEART:  PMI not displaced or sustained,S1 and S2 within normal limits, no S3, no S4, no clicks, no rubs, no murmurs ABD:  Flat, positive bowel sounds normal in frequency in pitch, no bruits, no rebound, no guarding, no midline pulsatile mass, no hepatomegaly, no splenomegaly EXT:  2 plus pulses throughout, no edema, no cyanosis no clubbing   EKG:  EKG is not ordered today.   Recent Labs: 06/02/2020: ALT 13; BUN 21; Creat 0.77; Potassium 4.7; Sodium 141    Lipid Panel    Component Value Date/Time   CHOL 162 06/02/2020 1013   CHOL 183 02/09/2018 1053   TRIG 98 06/02/2020 1013   TRIG 51 09/20/2006 0915   HDL 57 06/02/2020 1013   HDL 55 02/09/2018 1053   CHOLHDL 2.8 06/02/2020 1013   VLDL 20.2 11/20/2019 1012   LDLCALC 86 06/02/2020 1013      Wt Readings from Last 3 Encounters:  09/19/20 248 lb (112.5 kg)  09/14/20 240 lb (108.9 kg)  06/24/20 246 lb (111.6 kg)      Other studies Reviewed: Additional studies/ records that were reviewed today include: None Review of the above records demonstrates:  NA   ASSESSMENT AND PLAN:   Abnormal Stress Test She had a low risk study and no further symptoms since then.  No change in therapy is indicated.   PALPITATIONS:   I am going to look for the results of the monitor.  I am also getting give her propranolol 10 mg 3 times daily as needed.  We talked about methods of improving her anxiety and treating this.  HTN Her blood pressure is controlled.  No change in therapy.   HLD LDL was 86.  This is not at target.  I am going to change her from Zocor to Crestor 20 mg daily.   CAROTID STENOSIS She had 60 - 79% stenosis on  the left in Sept 2020.  She needs to have this follow up in one year.  We will schedule a carotid Doppler  ADDENDUM: I was able to look at the [facility] patch which she had for 1 day and 3 hours.  She had sinus rhythm.  She had rare ectopy and no sustained arrhythmias.  Current medicines are reviewed at length with the patient today.  The patient does not have concerns regarding medicines.  The following changes have been made:  As above  Labs/ tests ordered today include: None  Orders Placed This Encounter  Procedures  . Lipid panel  . Hepatic function panel     Disposition:   FU with me in one year.    Signed, Minus Breeding, MD  09/19/2020 12:20 PM    [facility]

## 2020-09-19 ENCOUNTER — Ambulatory Visit (INDEPENDENT_AMBULATORY_CARE_PROVIDER_SITE_OTHER): Payer: Medicare Other | Admitting: Cardiology

## 2020-09-19 ENCOUNTER — Encounter: Payer: Self-pay | Admitting: Cardiology

## 2020-09-19 ENCOUNTER — Other Ambulatory Visit: Payer: Self-pay

## 2020-09-19 VITALS — BP 136/78 | HR 74 | Ht 66.0 in | Wt 248.0 lb

## 2020-09-19 DIAGNOSIS — I10 Essential (primary) hypertension: Secondary | ICD-10-CM | POA: Diagnosis not present

## 2020-09-19 DIAGNOSIS — I6529 Occlusion and stenosis of unspecified carotid artery: Secondary | ICD-10-CM

## 2020-09-19 DIAGNOSIS — R9439 Abnormal result of other cardiovascular function study: Secondary | ICD-10-CM | POA: Diagnosis not present

## 2020-09-19 DIAGNOSIS — E785 Hyperlipidemia, unspecified: Secondary | ICD-10-CM | POA: Diagnosis not present

## 2020-09-19 MED ORDER — PROPRANOLOL HCL 10 MG PO TABS: 10.0000 mg | ORAL_TABLET | Freq: Three times a day (TID) | ORAL | 3 refills | Status: DC | PRN

## 2020-09-19 MED ORDER — ROSUVASTATIN CALCIUM 20 MG PO TABS: 20.0000 mg | ORAL_TABLET | Freq: Every day | ORAL | 3 refills | Status: DC

## 2020-09-19 NOTE — Patient Instructions (Addendum)
Medication Instructions:  STOP SIMVASTATIN  START CRESTOR 20MG  DAILY  START PROPRANOLOL 10MG  EVERY 8 HOURS AS NEEDED FOR PALPITATIONS  *If you need a refill on your cardiac medications before your next appointment, please call your pharmacy*  Lab Work: Your physician recommends that you return for lab work in [place] (February 18TH) LIPIDS / LIVER If you have labs (blood work) drawn today and your tests are completely normal, you will receive your results only by: Marland Kitchen MyChart Message (if you have MyChart) OR . A paper copy in the mail If you have any lab test that is abnormal or we need to change your treatment, we will call you to review the results.  Testing/Procedures: NONE ORDERED THIS VISIT  Follow-Up: At [facility], you and your health needs are our priority.  As part of our continuing mission to provide you with exceptional heart care, we have created designated Provider Care Teams.  These Care Teams include your primary Cardiologist (physician) and Advanced Practice Providers (APPs -  Physician Assistants and Nurse Practitioners) who all work together to provide you with the care you need, when you need it.  Your next appointment:   12 month(s)  You will receive a reminder letter in the mail two months in advance. If you don't receive a letter, please call our office to schedule the follow-up appointment.  The format for your next appointment:   In Person  Provider:   Minus Breeding, MD

## 2020-09-23 ENCOUNTER — Other Ambulatory Visit: Payer: Self-pay | Admitting: Family Medicine

## 2020-10-09 DIAGNOSIS — H43811 Vitreous degeneration, right eye: Secondary | ICD-10-CM | POA: Diagnosis not present

## 2020-10-09 DIAGNOSIS — H2513 Age-related nuclear cataract, bilateral: Secondary | ICD-10-CM | POA: Diagnosis not present

## 2020-10-17 ENCOUNTER — Ambulatory Visit: Payer: Medicare Other | Admitting: Family Medicine

## 2020-10-22 ENCOUNTER — Other Ambulatory Visit: Payer: Self-pay | Admitting: Family Medicine

## 2020-10-31 ENCOUNTER — Other Ambulatory Visit: Payer: Self-pay | Admitting: Family Medicine

## 2020-11-03 ENCOUNTER — Encounter: Payer: Self-pay | Admitting: Family Medicine

## 2020-11-04 ENCOUNTER — Other Ambulatory Visit: Payer: Self-pay

## 2020-11-04 MED ORDER — TIZANIDINE HCL 4 MG PO TABS
ORAL_TABLET | ORAL | 0 refills | Status: DC
Start: 2020-11-04 — End: 2021-03-23

## 2020-11-06 DIAGNOSIS — H40013 Open angle with borderline findings, low risk, bilateral: Secondary | ICD-10-CM | POA: Diagnosis not present

## 2020-11-06 DIAGNOSIS — E119 Type 2 diabetes mellitus without complications: Secondary | ICD-10-CM | POA: Diagnosis not present

## 2020-11-06 DIAGNOSIS — H2513 Age-related nuclear cataract, bilateral: Secondary | ICD-10-CM | POA: Diagnosis not present

## 2020-11-06 DIAGNOSIS — H43811 Vitreous degeneration, right eye: Secondary | ICD-10-CM | POA: Diagnosis not present

## 2020-11-06 LAB — HM DIABETES EYE EXAM

## 2020-11-07 ENCOUNTER — Telehealth: Payer: Self-pay | Admitting: Internal Medicine

## 2020-11-07 NOTE — Progress Notes (Signed)
  Chronic Care Management   Outreach Note  11/07/2020 Name: Erica [name] MRN: [id number] DOB: 12-Apr-1956  Referred by: [name], MD Reason for referral : No chief complaint on file.   An unsuccessful telephone outreach was attempted today. The patient was referred to the pharmacist for assistance with care management and care coordination.   Follow Up Plan:   Carley Perdue [employer] Scheduler

## 2020-11-24 ENCOUNTER — Telehealth: Payer: Self-pay | Admitting: Cardiology

## 2020-11-24 ENCOUNTER — Telehealth: Payer: Self-pay | Admitting: Family Medicine

## 2020-11-24 NOTE — Telephone Encounter (Signed)
Called patient, after speaking with Shelly she will send the results for review by Dr.Hochrein- patient had issues with the monitors sticking, and it took a while to get her results.   Patient verbalized understanding. Will await for MD to review.

## 2020-11-24 NOTE — Telephone Encounter (Signed)
Please advise. After review Darrick Penna said that they mailed her another monitor with [employer] tracking- and it was not returned. I asked if previous monitors had any data, but did not hear anything else yet. Advised I would route to primary nurse to follow up on this and monitor information. Patient will need to return the preventice monitor back.    Thank you!

## 2020-11-24 NOTE — Telephone Encounter (Signed)
Erica [name] called stating that she is still having very bad knee, hip, and sciatica pain. She asked if Dr Tamala Julian would be willing to prescribe a muscle relaxer?  Please advise. Erica [name] is scheduled for March 16th (first available).

## 2020-11-24 NOTE — Telephone Encounter (Signed)
Patient calling for heart monitor results.  

## 2020-11-24 NOTE — Telephone Encounter (Signed)
Spoke with patient. She reports she couldn't find the box to return the monitor so she took the monitor directly to the [address] office and they told her they would mail the monitor in for her. She reports this happened awhile ago. Will send to [facility] / [place] for assistance.

## 2020-11-25 NOTE — Telephone Encounter (Signed)
Left message for patient to call back to discuss medication.

## 2020-11-25 NOTE — Telephone Encounter (Signed)
It looks as though we did prescribe it to mail order for nighttime pain in January correct? She should be already on it  If not then would do zanaflex 2 mg at night only #30 until I see patient again  Otherwise she can see Dr. Georgina Snell sooner or urgent care

## 2020-11-28 ENCOUNTER — Telehealth: Payer: Self-pay | Admitting: Internal Medicine

## 2020-11-28 NOTE — Progress Notes (Signed)
  Chronic Care Management   Note  11/28/2020 Name: Erica [name] MRN: [id number] DOB: [birth year]  Erica [name] is a 65 y.o. year old female who is a primary care patient of Burns, Claudina Lick, MD. I reached out to [name] by phone today in response to a referral sent by [name] PCP, [name], MD.   [name] was given information about Chronic Care Management services today including:  1. CCM service includes personalized support from designated clinical staff supervised by her physician, including individualized plan of care and coordination with other care providers 2. 24/7 contact phone numbers for assistance for urgent and routine care needs. 3. Service will only be billed when office clinical staff spend 20 minutes or more in a month to coordinate care. 4. Only one practitioner may furnish and bill the service in a calendar month. 5. The patient may stop CCM services at any time (effective at the end of the month) by phone call to the office staff.   Patient agreed to services and verbal consent obtained.   Follow up plan:   Carley Perdue [employer] Scheduler

## 2020-12-04 ENCOUNTER — Encounter: Payer: Self-pay | Admitting: Family Medicine

## 2020-12-07 NOTE — Patient Instructions (Addendum)
  Blood work was ordered.     Medications changes include :   ozempic 0.25 mg weekly for one month then 0.5 mg weekly   Your prescription(s) have been submitted to your pharmacy. Please take as directed and contact our office if you believe you are having problem(s) with the medication(s).   A referral was ordered for nutrition.      Someone from their office will call you to schedule an appointment.

## 2020-12-07 NOTE — Progress Notes (Signed)
Subjective:    Patient ID: Erica [name], female    DOB: 08-21-1956, 65 y.o.   MRN: [id number]  HPI The patient is here for follow up of their chronic medical problems, including DM, htn, hyperlipidemia, migraines, GERD, constipation   She is not exercising regularly.   She does not eat well.  She eats more junk than real meals.    For breakfast 1/2 bagel and coffee Lunch - nothing or 1/2 sandwich Dinner - salad/vege, chicken - does not cook much Snacks   She is frustrated by her weight.  She has gained weight.  She knows she needs to lose weight.  Medications and allergies reviewed with patient and updated if appropriate.  Patient Active Problem List   Diagnosis Date Noted  . Morbid obesity ([place]) 12/08/2020  . Stenosis of carotid artery 09/18/2020  . Abnormal stress test 09/18/2020  . Greater trochanteric bursitis of both hips 08/25/2020  . Greater trochanteric bursitis of right hip 05/05/2020  . 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 Chronic constipation 08/23/2019  . Abdominal pain 08/23/2019  . Bilateral carotid artery stenosis 06/04/2019  . Osteopenia 05/19/2019  . Acute pain of left knee 12/11/2018  . Class 2 severe obesity with serious comorbidity and body mass index (BMI) of 37.0 to 37.9 in adult ([place]) 08/31/2018  . Degeneration of lumbar intervertebral disc 05/30/2018  . Onychomycosis 12/07/2017  . Left hip pain 06/12/2017  . Acute left-sided low back pain without sciatica 06/12/2017  . Meningioma ([place]) 03/28/2017  . Panic attacks 09/27/2016  . GERD (gastroesophageal reflux disease) 09/27/2016  . Right ankle pain 08/19/2016  . Diabetes ([place]) 02/15/2016  . Lump of skin 11/28/2015  . Migraine without aura and with status migrainosus, not intractable 10/03/2015  . Numbness of left hand 10/03/2015  . Cephalalgia 10/03/2015  . Palpitations 09/08/2012  . Sciatica of left side   . Peripheral edema 05/09/2012  . FATIGUE  02/23/2010  . Obstructive sleep apnea 10/31/2009  . External hemorrhoids 10/17/2009  . CONSTIPATION, CHRONIC 10/17/2009  . Hyperlipidemia 01/12/2008  . Depression, major, recurrent ([place]) 01/12/2008  . Essential hypertension, benign 01/12/2008  . Allergic rhinitis 01/12/2008    Current Outpatient Medications on File Prior to Visit  Medication Sig Dispense Refill  . albuterol (VENTOLIN HFA) 108 (90 Base) MCG/ACT inhaler Inhale 2 puffs into the lungs every 6 (six) hours as needed for wheezing or shortness of breath. 16 g 3  . ALPRAZolam (XANAX) 1 MG tablet Take 1 mg by mouth as needed for anxiety.    Marland Kitchen atenolol (TENORMIN) 25 MG tablet Take 37m (1 tablet) in the AM and 53m(2 tablets) in the PM 14 tablet 0  . blood glucose meter kit and supplies KIT Dispense based on patient and insurance preference. Use up to four times daily as directed. (FOR E11.9). 1 each 0  . camphor-menthol (SARNA) lotion Apply 1 application topically as needed for itching. 222 mL 0  . fluticasone (FLONASE) 50 MCG/ACT nasal spray Place 2 sprays into both nostrils daily. 16 g 0  . furosemide (LASIX) 40 MG tablet Take 1 tablet (40 mg total) by mouth 2 (two) times daily. 28 tablet 0  . HYDROcodone-acetaminophen (NORCO/VICODIN) 5-325 MG tablet Take 1 tablet by mouth every 4 (four) hours as needed. 10 tablet 0  . hydrocortisone (ANUSOL-HC) 2.5 % rectal cream Place rectally as needed. 30 g 3  . hydrOXYzine (ATARAX/VISTARIL) 10 MG tablet Take 1 tablet (10 mg  total) by mouth 3 (three) times daily as needed for itching. 15 tablet 0  . levocetirizine (XYZAL) 5 MG tablet Take 1 tablet (5 mg total) by mouth every evening. 14 tablet 0  . linaclotide (LINZESS) 290 MCG CAPS capsule Take 1 capsule (290 mcg total) by mouth daily before breakfast. 2 week supply until she receive mail order 14 capsule 0  . metFORMIN (GLUCOPHAGE) 500 MG tablet Take 1 tablet (500 mg total) by mouth daily with breakfast. 2 week supply until she receive mail  order 14 tablet 0  . Multiple Vitamins-Minerals (MULTIVITAMIN WITH MINERALS) tablet Take 1 tablet by mouth daily.    Marland Kitchen omeprazole (PRILOSEC) 20 MG capsule Take 1 capsule (20 mg total) by mouth daily. 2 week supply until she receive mail order 14 capsule 0  . ondansetron (ZOFRAN) 4 MG tablet Take 1 tablet (4 mg total) by mouth every 8 (eight) hours as needed for nausea or vomiting. 40 tablet 0  . potassium chloride SA (KLOR-CON) 20 MEQ tablet Take 1 tablet (20 mEq total) by mouth 2 (two) times daily. 2 week supply until she receive mail order (Patient taking differently: Take 20 mEq by mouth daily. 1 tablet by mouth daily) 14 tablet 0  . propranolol (INDERAL) 10 MG tablet Take 1 tablet (10 mg total) by mouth 3 (three) times daily as needed (PALPITATIONS). 60 tablet 3  . rosuvastatin (CRESTOR) 20 MG tablet Take 1 tablet (20 mg total) by mouth daily. 90 tablet 3  . SUMAtriptan-naproxen (TREXIMET) 85-500 MG tablet Take 1 tablet by mouth every 2 (two) hours as needed for migraine. 10 tablet 8  . tiZANidine (ZANAFLEX) 4 MG tablet TAKE 1 TABLET BY MOUTH AT BEDTIME 90 tablet 0  . venlafaxine XR (EFFEXOR-XR) 150 MG 24 hr capsule Take 1 capsule (150 mg total) by mouth 2 (two) times daily. 14 capsule 0  . [DISCONTINUED] oxycodone (OXY-IR) 5 MG capsule Take 5 mg by mouth every 4 (four) hours as needed. For pain.     No current facility-administered medications on file prior to visit.    Past Medical History:  Diagnosis Date  . Abnormal stress test    a. 09/2016: NST showed a small defect of mild severity present in the basal inferoseptal and mid inferoseptal location, consistent with ischemia. --> medically managed  . ALLERGIC RHINITIS   . Anxiety   . Bursitis   . DDD (degenerative disc disease), lumbar    ESI with Ramos (spring 2016)  . Depression   . Diabetes mellitus without complication ([place])   . Dyslipidemia   . External hemorrhoids   . GERD (gastroesophageal reflux disease)   . Hypertension    . Obesity   . Osteoarthritis   . Palpitations    a. prior event monitor showing sinus tachycardia, no PAF.   Marland Kitchen Panic attacks    Hx of depression  . Sleep apnea    CPAP hs    Past Surgical History:  Procedure Laterality Date  . ABDOMINAL HYSTERECTOMY    . MASS EXCISION Left 12/30/2015   Procedure: EXCISION LEFT LEG MASS;  Surgeon: Donnie Mesa, MD;  Location: [facility];  Service: General;  Laterality: Left;  . REPLACEMENT TOTAL KNEE Right   . RIGHT OOPHORECTOMY     No cancer    Social History   Socioeconomic History  . Marital status: Married    Spouse name: [name]  . Number of children: 3  . Years of education: Not on file  . Highest  education level: Not on file  Occupational History  . Occupation: Disabled  Tobacco Use  . Smoking status: Never Smoker  . Smokeless tobacco: Never Used  . Tobacco comment: tried to as a teenager  Vaping Use  . Vaping Use: Never used  Substance and Sexual Activity  . Alcohol use: Yes    Alcohol/week: 1.0 standard drink    Types: 1 Glasses of wine per week    Comment: one drink a month  . Drug use: No  . Sexual activity: Not Currently  Other Topics Concern  . Not on file  Social History Narrative   Married with children. Pt is on disablity. Previous worked in the school system   Social Determinants of Radio broadcast assistant Strain: Not on file  Food Insecurity: Not on file  Transportation Needs: Not on file  Physical Activity: Not on file  Stress: Not on file  Social Connections: Not on file    Family History  Problem Relation Age of Onset  . Heart disease Mother        Enlarged Heart, Pacemaker  . Diabetes Mother   . Thyroid disease Mother   . Hyperlipidemia Mother   . Hypertension Mother   . Sleep apnea Mother   . Dementia Father   . Kidney failure Father   . Prostate cancer Father   . Alcoholism Father   . Asthma Other   . Allergies Sister   . Asthma Son   . Breast cancer Maternal Aunt         cousin  . Colon cancer Cousin   . Heart disease Son        CHF, morbid obesity  . Prostate cancer Maternal Uncle   . Diabetes Son     Review of Systems  Constitutional: Negative for fever.  Respiratory: Negative for cough, shortness of breath and wheezing.   Cardiovascular: Positive for palpitations and leg swelling. Negative for chest pain.  Neurological: Positive for headaches (occ). Negative for light-headedness.       Objective:   Vitals:   12/08/20 1044  BP: 136/82  Pulse: 76  Temp: 98 F (36.7 C)  SpO2: 98%   BP Readings from Last 3 Encounters:  12/08/20 136/82  09/19/20 136/78  09/14/20 132/85   Wt Readings from Last 3 Encounters:  12/08/20 259 lb (117.5 kg)  09/19/20 248 lb (112.5 kg)  09/14/20 240 lb (108.9 kg)   Body mass index is 41.8 kg/m.   Physical Exam    Constitutional: Appears well-developed and well-nourished. No distress.  HENT:  Head: Normocephalic and atraumatic.  Neck: Neck supple. No tracheal deviation present. No thyromegaly present.  No cervical lymphadenopathy Cardiovascular: Normal rate, regular rhythm and normal heart sounds.   No murmur heard. No carotid bruit .  No edema Pulmonary/Chest: Effort normal and breath sounds normal. No respiratory distress. No has no wheezes. No rales.  Skin: Skin is warm and dry. Not diaphoretic.  Psychiatric: Normal mood and affect. Behavior is normal.      Assessment & Plan:    See Problem List for Assessment and Plan of chronic medical problems.    This visit occurred during the SARS-CoV-2 public health emergency.  Safety protocols were in place, including screening questions prior to the visit, additional usage of staff PPE, and extensive cleaning of exam room while observing appropriate contact time as indicated for disinfecting solutions.

## 2020-12-08 ENCOUNTER — Other Ambulatory Visit: Payer: Self-pay

## 2020-12-08 ENCOUNTER — Encounter: Payer: Self-pay | Admitting: Internal Medicine

## 2020-12-08 ENCOUNTER — Ambulatory Visit (INDEPENDENT_AMBULATORY_CARE_PROVIDER_SITE_OTHER): Payer: Medicare Other | Admitting: Internal Medicine

## 2020-12-08 VITALS — BP 136/82 | HR 76 | Temp 98.0°F | Ht 66.0 in | Wt 259.0 lb

## 2020-12-08 DIAGNOSIS — I10 Essential (primary) hypertension: Secondary | ICD-10-CM | POA: Diagnosis not present

## 2020-12-08 DIAGNOSIS — G43001 Migraine without aura, not intractable, with status migrainosus: Secondary | ICD-10-CM | POA: Diagnosis not present

## 2020-12-08 DIAGNOSIS — E119 Type 2 diabetes mellitus without complications: Secondary | ICD-10-CM | POA: Diagnosis not present

## 2020-12-08 DIAGNOSIS — K5909 Other constipation: Secondary | ICD-10-CM | POA: Diagnosis not present

## 2020-12-08 DIAGNOSIS — E7849 Other hyperlipidemia: Secondary | ICD-10-CM | POA: Diagnosis not present

## 2020-12-08 DIAGNOSIS — K219 Gastro-esophageal reflux disease without esophagitis: Secondary | ICD-10-CM | POA: Diagnosis not present

## 2020-12-08 DIAGNOSIS — F331 Major depressive disorder, recurrent, moderate: Secondary | ICD-10-CM

## 2020-12-08 LAB — URINALYSIS, ROUTINE W REFLEX MICROSCOPIC
Bilirubin Urine: NEGATIVE
Hgb urine dipstick: NEGATIVE
Leukocytes,Ua: NEGATIVE
Nitrite: NEGATIVE
RBC / HPF: NONE SEEN (ref 0–?)
Specific Gravity, Urine: >=1.03 — AB (ref 1.000–1.030)
Total Protein, Urine: NEGATIVE
Urine Glucose: NEGATIVE
Urobilinogen, UA: 0.2 (ref 0.0–1.0)
pH: 5.5 (ref 5.0–8.0)

## 2020-12-08 LAB — MICROALBUMIN / CREATININE URINE RATIO
Creatinine,U: 241 mg/dL
Microalb Creat Ratio: 0.5 mg/g (ref 0.0–30.0)
Microalb, Ur: 1.2 mg/dL (ref 0.0–1.9)

## 2020-12-08 LAB — COMPREHENSIVE METABOLIC PANEL
ALT: 14 U/L (ref 0–35)
AST: 17 U/L (ref 0–37)
Albumin: 4 g/dL (ref 3.5–5.2)
Alkaline Phosphatase: 139 U/L — ABNORMAL HIGH (ref 39–117)
BUN: 21 mg/dL (ref 6–23)
CO2: 30 mEq/L (ref 19–32)
Calcium: 9.9 mg/dL (ref 8.4–10.5)
Chloride: 101 mEq/L (ref 96–112)
Creatinine, Ser: 0.77 mg/dL (ref 0.40–1.20)
GFR: 80.04 mL/min (ref >60.00)
Glucose, Bld: 102 mg/dL — ABNORMAL HIGH (ref 70–99)
Potassium: 4.2 mEq/L (ref 3.5–5.1)
Sodium: 138 mEq/L (ref 135–145)
Total Bilirubin: 0.3 mg/dL (ref 0.2–1.2)
Total Protein: 7.5 g/dL (ref 6.0–8.3)

## 2020-12-08 LAB — HEMOGLOBIN A1C: Hgb A1c MFr Bld: 6.6 % — ABNORMAL HIGH (ref 4.6–6.5)

## 2020-12-08 LAB — LIPID PANEL
Cholesterol: 139 mg/dL (ref 0–200)
HDL: 50.4 mg/dL (ref >39.00)
LDL Cholesterol: 63 mg/dL (ref 0–99)
NonHDL: 88.65
Total CHOL/HDL Ratio: 3
Triglycerides: 126 mg/dL (ref 0.0–149.0)
VLDL: 25.2 mg/dL (ref 0.0–40.0)

## 2020-12-08 MED ORDER — SUMATRIPTAN-NAPROXEN SODIUM 85-500 MG PO TABS
ORAL_TABLET | ORAL | 8 refills | Status: DC
Start: 2020-12-08 — End: 2020-12-25

## 2020-12-08 MED ORDER — VITAMIN D (ERGOCALCIFEROL) 1.25 MG (50000 UNIT) PO CAPS
50000.0000 [IU] | ORAL_CAPSULE | ORAL | 0 refills | Status: DC
Start: 2020-12-08 — End: 2020-12-12

## 2020-12-08 MED ORDER — OZEMPIC (0.25 OR 0.5 MG/DOSE) 2 MG/1.5ML ~~LOC~~ SOPN: PEN_INJECTOR | SUBCUTANEOUS | 3 refills | Status: DC

## 2020-12-08 NOTE — Assessment & Plan Note (Signed)
Chronic Management per psychiatry 

## 2020-12-08 NOTE — Assessment & Plan Note (Signed)
Chronic Stressed regular exercise-discussed goal of 30 minutes 5 times a week-advised starting with 5 minutes and building from there She is not eating healthfully and not eating regular meals-referred to nutrition Discussed the importance of concentrating on increasing protein and vegetables-decrease carbohydrates, sugars-avoid snack foods Follow-up in 2 months

## 2020-12-08 NOTE — Assessment & Plan Note (Addendum)
Chronic Controlled Continue Metformin 500 mg daily Will add Ozempic and titrate dose if tolerated to help keep sugars well controlled and aid in weight loss-start 0.25 mg weekly for 4 weeks, then increase to 0.5 mg weekly Follow-up in 2 months-we will increase further if tolerated Refer to nutrition Stressed regular exercise and diabetic diet

## 2020-12-08 NOTE — Assessment & Plan Note (Signed)
Not controlled BM 2 week

## 2020-12-08 NOTE — Assessment & Plan Note (Signed)
Chronic Lenses seem to be working previously, but she states constipation is not well controlled Continue Linzess to 90 mcg daily She stopped taking Metamucil Encouraged dietary changes, increase exercise Continue increased water intake Discussed that she may need a few over-the-counter stool softeners and medications to help in addition to the [place]

## 2020-12-08 NOTE — Assessment & Plan Note (Signed)
Chronic Has approximately 1 migraine a month Continue Treximet as needed for migraine

## 2020-12-08 NOTE — Assessment & Plan Note (Signed)
Chronic Check lipid panel  Continue Crestor 20 mg daily Regular exercise and healthy diet encouraged  

## 2020-12-08 NOTE — Assessment & Plan Note (Signed)
Chronic Blood pressure controlled Continue atenolol 25 mg in the morning, 50 mg in the evening, furosemide 40 mg twice daily CMP

## 2020-12-08 NOTE — Assessment & Plan Note (Signed)
Chronic GERD controlled Continue omeprazole 20 mg daily  

## 2020-12-12 ENCOUNTER — Other Ambulatory Visit: Payer: Self-pay

## 2020-12-12 ENCOUNTER — Telehealth: Payer: Self-pay | Admitting: Family Medicine

## 2020-12-12 MED ORDER — VITAMIN D (ERGOCALCIFEROL) 1.25 MG (50000 UNIT) PO CAPS
50000.0000 [IU] | ORAL_CAPSULE | ORAL | 0 refills | Status: DC
Start: 2020-12-12 — End: 2021-04-28

## 2020-12-12 NOTE — Telephone Encounter (Signed)
Left message for patient that Rx was changed to [facility].

## 2020-12-12 NOTE — Telephone Encounter (Signed)
Patient called asking if her Vitamin D prescription could be sent to the [facility].  Please advise.

## 2020-12-19 ENCOUNTER — Other Ambulatory Visit: Payer: Self-pay

## 2020-12-19 ENCOUNTER — Encounter: Payer: Self-pay | Admitting: Pulmonary Disease

## 2020-12-19 ENCOUNTER — Ambulatory Visit (INDEPENDENT_AMBULATORY_CARE_PROVIDER_SITE_OTHER): Payer: Medicare Other | Admitting: Pulmonary Disease

## 2020-12-19 DIAGNOSIS — G4733 Obstructive sleep apnea (adult) (pediatric): Secondary | ICD-10-CM | POA: Diagnosis not present

## 2020-12-19 NOTE — Patient Instructions (Signed)
  Prescription will be sent to DME for nasal pillows. Weight loss of 30 pounds advised to qualify for inspire device

## 2020-12-19 NOTE — Assessment & Plan Note (Signed)
She has mild to moderate OSA.  She has had issues with CPAP tolerance due to claustrophobia with full facemask and sinusitis with other interfaces.  She does not tolerate decongestants. We do not have much success with oral appliance and her weight is too high to qualify for upper airway stimulation device. We will send a prescription for nasal pillows and get her started back on CPAP. In the long run weight loss of 30 to 40 pounds advised, if she is able to come down to a BMI of 32, she may qualify for hypoglossal stimulation device  Weight loss encouraged, compliance with goal of at least 4-6 hrs every night is the expectation. Advised against medications with sedative side effects Cautioned against driving when sleepy - understanding that sleepiness will vary on a day to day basis

## 2020-12-19 NOTE — Progress Notes (Signed)
   Subjective:    Patient ID: Erica [name], female    DOB: [birth year], 65 y.o.   MRN: [id number]  HPI   65 yo never smoker for follow-up of OSA PMH -osteoarthritis, diabetes, panic attacks  OSA diagnosed in 2011 was poorly compliant, tred CPAP again in 2017 and could not tolerate, obtained oral appliance but follow-up home study showed that OSA was not completely treated. Hence started back on autoCPAP in 04/2017 with nasal pillows  Last OV 07/2019 >> we sent her for mask desensitization, she was planning gastric sleeve surgery, weight was up 50 pounds which preclude upper airway stimulation Sinusitis >> cannot tolerate decongestants  She was desensitized to a medium F&P Eson2 2 mask, however she had issues with claustrophobia with this full facemask and would prefer to go back on the nasal pillows meantime she is still having choking and gasping sensations in her sleep and feels not refreshed.  She has gained weight to 259 pounds, reviewed PCP notes   Significant tests/ events reviewed   05/2019 CPAP titration >> 13 cm 11/2009 mild ,AHI of 10/hr and desat as low as 83% - wt 240 lbs - BMI 39  PSG 09/2013 showed mild OSA- AHI 13/h HST 01/2016 20/hr. HST 03/2017 >> (with oral appliance ) >> mild OSa persists  CT head showed meningioma & has cluster headaches Review of Systems neg for any significant sore throat, dysphagia, itching, sneezing, nasal congestion or excess/ purulent secretions, fever, chills, sweats, unintended wt loss, pleuritic or exertional cp, hempoptysis, orthopnea pnd or change in chronic leg swelling. Also denies presyncope, palpitations, heartburn, abdominal pain, nausea, vomiting, diarrhea or change in bowel or urinary habits, dysuria,hematuria, rash, arthralgias, visual complaints, headache, numbness weakness or ataxia.     Objective:   Physical Exam  Gen. Pleasant, obese, in no distress ENT - no lesions, no post nasal drip Neck: No JVD, no  thyromegaly, no carotid bruits Lungs: no use of accessory muscles, no dullness to percussion, decreased without rales or rhonchi  Cardiovascular: Rhythm regular, heart sounds  normal, no murmurs or gallops, no peripheral edema Musculoskeletal: No deformities, no cyanosis or clubbing , no tremors       Assessment & Plan:

## 2020-12-19 NOTE — Assessment & Plan Note (Addendum)
I strongly advised her to consider evaluation for bariatric surgery.  She has been provided a referral to nutrition and that would be a start She has at least 3 weight dependent conditions namely diabetes, osteoarthritis right knee and OSA

## 2020-12-22 ENCOUNTER — Telehealth: Payer: Self-pay | Admitting: Internal Medicine

## 2020-12-22 NOTE — Telephone Encounter (Signed)
Patient calling states she needs all her medicines refilled, I asked her which ones and she just said "all of them I do it every 6 months" she also wanted to know if she needed to still take the metformin if she is doing the shots in her stomach. [facility] [place], [place] - [address] Phone:  [phone]  Fax:  [phone]     [phone]

## 2020-12-23 NOTE — Progress Notes (Signed)
[facility] [place] Phone: [phone] Subjective:   Erica [name], am serving as a scribe for Dr. [name]. This visit occurred during the SARS-CoV-2 public health emergency.  Safety protocols were in place, including screening questions prior to the visit, additional usage of staff PPE, and extensive cleaning of exam room while observing appropriate contact time as indicated for disinfecting solutions.   I'm seeing this patient by the request  of:  [name], MD  CC: knee and hip pain   [id number]   08/25/2020 Bilateral injections given today.  Tolerated procedure well.  I am concerned that patient is having more of a lumbar radiculopathy and I do feel that the epidural will be more beneficial.  Patient does have Zanaflex and can take 1 pill at night.  Discussed with her not to take it with the baclofen which patient states she does not have.  Follow-up with me again 2 months  Worsening sciatica on the left side.  Patient given greater trochanteric injections to see if this would be beneficial.  Discussed icing regimen and home exercises.  Patient will have the epidural again which patient responded well to in 2020.  Patient will follow up with me again in 6 weeks.  Update 12/24/2020 Erica [name] is a 65 y.o. female coming in with complaint of bilateral hip and lower back pain. States that she is having pain over greater trochanter especially when trying to sleep. Injections did help her pain.   Patient states that she has been having shooting pains in left tibialis anterior. Developed bruise on left IT band after having the shooting pain. Has history of bruising in this area. Has been trying to walk for exercise but [name] more than usual.      bilateral GT injections given 11/21  Epidural given 11/23 L5/S1  Past Medical History:  Diagnosis Date  . Abnormal stress test    a. 09/2016: NST showed a small  defect of mild severity present in the basal inferoseptal and mid inferoseptal location, consistent with ischemia. --> medically managed  . ALLERGIC RHINITIS   . Anxiety   . Bursitis   . DDD (degenerative disc disease), lumbar    ESI with [facility] (spring 2016)  . Depression   . Diabetes mellitus without complication ([place])   . Dyslipidemia   . External hemorrhoids   . GERD (gastroesophageal reflux disease)   . Hypertension   . Obesity   . Osteoarthritis   . Palpitations    a. prior event monitor showing sinus tachycardia, [name] PAF.   Marland Kitchen Panic attacks    Hx of depression  . Sleep apnea    CPAP hs   Past Surgical History:  Procedure Laterality Date  . ABDOMINAL HYSTERECTOMY    . MASS EXCISION Left 12/30/2015   Procedure: EXCISION LEFT LEG MASS;  Surgeon: Donnie Mesa, MD;  Location: [facility];  Service: General;  Laterality: Left;  . REPLACEMENT TOTAL KNEE Right   . RIGHT OOPHORECTOMY     [name] cancer   Social History   Socioeconomic History  . Marital status: Married    Spouse name: [name]  . Number of children: 3  . Years of education: Not on file  . Highest education level: Not on file  Occupational History  . Occupation: Disabled  Tobacco Use  . Smoking status: Never Smoker  . Smokeless tobacco: Never Used  . Tobacco comment: tried to as a  teenager  Vaping Use  . Vaping Use: Never used  Substance and Sexual Activity  . Alcohol use: Yes    Alcohol/week: 1.0 standard drink    Types: 1 Glasses of wine per week    Comment: one drink a month  . Drug use: [name]  . Sexual activity: Not Currently  Other Topics Concern  . Not on file  Social History Narrative   Married with children. Pt is on disablity. Previous worked in the school system   Social Determinants of Radio broadcast assistant Strain: Not on file  Food Insecurity: Not on file  Transportation Needs: Not on file  Physical Activity: Not on file  Stress: Not on file  Social Connections: Not on  file   Allergies  Allergen Reactions  . Seroquel [Quetiapine Fumarate]     Hallucinations, palpitations  . Codeine Nausea And Vomiting  . Guaifenesin-Codeine Other (See Comments)    REACTION: feels spacey  . Wellbutrin [Bupropion] Palpitations    hallucinations   Family History  Problem Relation Age of Onset  . Heart disease Mother        Enlarged Heart, Pacemaker  . Diabetes Mother   . Thyroid disease Mother   . Hyperlipidemia Mother   . Hypertension Mother   . Sleep apnea Mother   . Dementia Father   . Kidney failure Father   . Prostate cancer Father   . Alcoholism Father   . Asthma Other   . Allergies Sister   . Asthma Son   . Breast cancer Maternal Aunt        cousin  . Colon cancer Cousin   . Heart disease Son        CHF, morbid obesity  . Prostate cancer Maternal Uncle   . Diabetes Son     Current Outpatient Medications (Endocrine & Metabolic):  .  metFORMIN (GLUCOPHAGE) 500 MG tablet, Take 1 tablet (500 mg total) by mouth daily with breakfast. 2 week supply until she receive mail order .  Semaglutide,0.25 or 0.5MG/DOS, (OZEMPIC, 0.25 OR 0.5 MG/DOSE,) 2 MG/1.5ML SOPN, Inject 0.25 mg into the skin once a week for 30 days, THEN 0.5 mg once a week.  Current Outpatient Medications (Cardiovascular):  .  atenolol (TENORMIN) 25 MG tablet, Take 4m (1 tablet) in the AM and 545m(2 tablets) in the PM .  furosemide (LASIX) 40 MG tablet, Take 1 tablet (40 mg total) by mouth 2 (two) times daily. .  propranolol (INDERAL) 10 MG tablet, Take 1 tablet (10 mg total) by mouth 3 (three) times daily as needed (PALPITATIONS). .  rosuvastatin (CRESTOR) 20 MG tablet, Take 1 tablet (20 mg total) by mouth daily.  Current Outpatient Medications (Respiratory):  .  albuterol (VENTOLIN HFA) 108 (90 Base) MCG/ACT inhaler, Inhale 2 puffs into the lungs every 6 (six) hours as needed for wheezing or shortness of breath. .  fluticasone (FLONASE) 50 MCG/ACT nasal spray, Place 2 sprays into both  nostrils daily. . Marland Kitchenlevocetirizine (XYZAL) 5 MG tablet, Take 1 tablet (5 mg total) by mouth every evening.  Current Outpatient Medications (Analgesics):  .  HYDROcodone-acetaminophen (NORCO/VICODIN) 5-325 MG tablet, Take 1 tablet by mouth every 4 (four) hours as needed. .  SUMAtriptan-naproxen (TREXIMET) 85-500 MG tablet, Take 1 pill as needed for migraine, if migraine persists may repeat x1 after 2 hours   Current Outpatient Medications (Other):  . Marland KitchenALPRAZolam (XANAX) 1 MG tablet, Take 1 mg by mouth as needed for anxiety. .  blood glucose  meter kit and supplies KIT, Dispense based on patient and insurance preference. Use up to four times daily as directed. (FOR E11.9). .  camphor-menthol (SARNA) lotion, Apply 1 application topically as needed for itching. .  hydrocortisone (ANUSOL-HC) 2.5 % rectal cream, Place rectally as needed. .  hydrOXYzine (ATARAX/VISTARIL) 10 MG tablet, Take 1 tablet (10 mg total) by mouth 3 (three) times daily as needed for itching. .  linaclotide (LINZESS) 290 MCG CAPS capsule, Take 1 capsule (290 mcg total) by mouth daily before breakfast. 2 week supply until she receive mail order .  Multiple Vitamins-Minerals (MULTIVITAMIN WITH MINERALS) tablet, Take 1 tablet by mouth daily. Marland Kitchen  omeprazole (PRILOSEC) 20 MG capsule, Take 1 capsule (20 mg total) by mouth daily. 2 week supply until she receive mail order .  ondansetron (ZOFRAN) 4 MG tablet, Take 1 tablet (4 mg total) by mouth every 8 (eight) hours as needed for nausea or vomiting. .  potassium chloride SA (KLOR-CON) 20 MEQ tablet, Take 1 tablet (20 mEq total) by mouth 2 (two) times daily. 2 week supply until she receive mail order (Patient taking differently: Take 20 mEq by mouth daily. 1 tablet by mouth daily) .  tiZANidine (ZANAFLEX) 4 MG tablet, TAKE 1 TABLET BY MOUTH AT BEDTIME .  venlafaxine XR (EFFEXOR-XR) 150 MG 24 hr capsule, Take 1 capsule (150 mg total) by mouth 2 (two) times daily. .  Vitamin D, Ergocalciferol,  (DRISDOL) 1.25 MG (50000 UNIT) CAPS capsule, Take 1 capsule (50,000 Units total) by mouth every 7 (seven) days.   Reviewed prior external information including notes and imaging from  primary care provider As well as notes that were available from care everywhere and other healthcare systems.  Past medical history, social, surgical and family history all reviewed in electronic medical record.  [name] pertanent information unless stated regarding to the chief complaint.   Review of Systems:  [name] headache, visual changes, nausea, vomiting, diarrhea, constipation, dizziness, abdominal pain, skin rash, fevers, chills, night sweats, weight loss, swollen lymph nodes, body aches, joint swelling, chest pain, shortness of breath, mood changes. POSITIVE muscle aches  Objective  Blood pressure 128/82, pulse 77, height '5\' 6"'  (1.676 m), SpO2 96 %.   General: [name] apparent distress alert and oriented x3 mood and affect normal, dressed appropriately.  Overweight HEENT: Pupils equal, extraocular movements intact  Respiratory: Patient's speak in full sentences and does not appear short of breath  Gait antalgic MSK: Patient does have tenderness to palpation over the greater trochanteric area bilaterally.  Patient does have poor core strength noted.  Patient does have pain in the left calf that is fairly severe.  Edema noted in both lower extremities.  May be some mild more on the left than the right. Continues to have some mild instability of the right knee.  More pain in the left knee.  Arthritic changes with instability noted with valgus and varus force. Low back exam is tender to palpation in the paraspinal musculature.  Tightness with straight leg test bilaterally left greater than right [name] true radicular symptoms.  Mild increase low in the calf pain with extension of the leg   After verbal consent patient was prepped with alcohol swab and with a 21-gauge 2 inch needle injected into the left greater trochanteric  area with a total of 1 cc of 0.5% Marcaine and 1 cc of Kenalog 40 mg/mL.  [name] blood loss, Band-Aid placed.  Postinjection instructions given  After verbal consent patient was prepped with alcohol swab  and with a 21-gauge 2 inch needle injected into the right greater trochanteric area with a total of 2 cc of 0.5% Marcaine and 1 cc of Kenalog 40 mg/mL.  [name] blood loss.  Band-Aid placed.  Postinjection instructions given.   Impression and Recommendations:     The above documentation has been reviewed and is accurate and complete Lyndal Pulley, DO

## 2020-12-24 ENCOUNTER — Ambulatory Visit (INDEPENDENT_AMBULATORY_CARE_PROVIDER_SITE_OTHER): Payer: Medicare Other

## 2020-12-24 ENCOUNTER — Ambulatory Visit (HOSPITAL_COMMUNITY)
Admission: RE | Admit: 2020-12-24 | Discharge: 2020-12-24 | Disposition: A | Discharge status: Home / Self Care | Payer: Medicare Other | Source: Ambulatory Visit | Attending: Cardiovascular Disease | Admitting: Cardiovascular Disease

## 2020-12-24 ENCOUNTER — Encounter: Payer: Self-pay | Admitting: Family Medicine

## 2020-12-24 ENCOUNTER — Ambulatory Visit (INDEPENDENT_AMBULATORY_CARE_PROVIDER_SITE_OTHER): Payer: Medicare Other | Admitting: Family Medicine

## 2020-12-24 ENCOUNTER — Other Ambulatory Visit: Payer: Self-pay

## 2020-12-24 VITALS — BP 128/82 | HR 77 | Ht 66.0 in

## 2020-12-24 DIAGNOSIS — M5432 Sciatica, left side: Secondary | ICD-10-CM

## 2020-12-24 DIAGNOSIS — M79662 Pain in left lower leg: Secondary | ICD-10-CM

## 2020-12-24 DIAGNOSIS — M25562 Pain in left knee: Secondary | ICD-10-CM | POA: Diagnosis not present

## 2020-12-24 DIAGNOSIS — G8929 Other chronic pain: Secondary | ICD-10-CM

## 2020-12-24 DIAGNOSIS — M7062 Trochanteric bursitis, left hip: Secondary | ICD-10-CM

## 2020-12-24 DIAGNOSIS — E785 Hyperlipidemia, unspecified: Secondary | ICD-10-CM

## 2020-12-24 DIAGNOSIS — J301 Allergic rhinitis due to pollen: Secondary | ICD-10-CM

## 2020-12-24 DIAGNOSIS — E119 Type 2 diabetes mellitus without complications: Secondary | ICD-10-CM | POA: Diagnosis not present

## 2020-12-24 DIAGNOSIS — M545 Low back pain, unspecified: Secondary | ICD-10-CM | POA: Diagnosis not present

## 2020-12-24 DIAGNOSIS — M5136 Other intervertebral disc degeneration, lumbar region: Secondary | ICD-10-CM | POA: Diagnosis not present

## 2020-12-24 DIAGNOSIS — M7061 Trochanteric bursitis, right hip: Secondary | ICD-10-CM | POA: Diagnosis not present

## 2020-12-24 NOTE — Assessment & Plan Note (Signed)
Patient likely has arthritic changes.  Does have a contralateral placement.  Discussed which activities to do which wants to avoid.  Increase activity slowly.  Patient will consider the possibility of a custom brace on this knee when she has one on the contralateral side but is not wearing it.  Will consider injections at follow-up.  Per staff will rule out though the potential for a blood clot.

## 2020-12-24 NOTE — Assessment & Plan Note (Signed)
Patient has had difficulty with sciatica but this time seems to be more in the calf itself.  Worsening pain on this and does have some mild swelling noted.  No significant erythema or warmness but patient is severely tender.  Will get Doppler to rule out clot.  Patient is moderate to high risk.  Patient otherwise will continue to monitor for other symptoms.  Does have the baseline of peripheral edema of the lower extremities.  Encouraged once again weight loss.  Patient will start with formal physical therapy for the greater trochanteric bursitis as well as the back.  Follow-up with me again 4 to 8 weeks

## 2020-12-24 NOTE — Patient Instructions (Addendum)
[email or web address] [employer]  ([address] [employer] in [facility]) [address] [place],  [address] [phone] Appt time: 3:00pm today, arrive at 2:45pm  Xray today  PT will call you  See me again in 5-6 weeks

## 2020-12-24 NOTE — Assessment & Plan Note (Signed)
Patient given injection again today.  This seems to be worsening.  Could be more of a lumbar radiculopathy and patient has responded well to the epidurals.  We did discuss with patient that after 1 week of worsening pain and incontinence I would encourage her to consider another epidural.  Patient is going to go to formal physical therapy and referred today as well.  Discussed icing regimen and home exercises.  Encourage weight loss.  Patient is going to start with a healthy weight and wellness.  Follow-up with me again 4 to 8 weeks

## 2020-12-25 ENCOUNTER — Other Ambulatory Visit: Payer: Self-pay

## 2020-12-25 DIAGNOSIS — E785 Hyperlipidemia, unspecified: Secondary | ICD-10-CM

## 2020-12-25 MED ORDER — LINACLOTIDE 290 MCG PO CAPS
290.0000 ug | ORAL_CAPSULE | Freq: Every day | ORAL | 0 refills | Status: DC
Start: 2020-12-25 — End: 2021-02-06

## 2020-12-25 MED ORDER — VENLAFAXINE HCL ER 150 MG PO CP24
150.0000 mg | ORAL_CAPSULE | Freq: Two times a day (BID) | ORAL | 0 refills | Status: DC
Start: 2020-12-25 — End: 2021-08-11

## 2020-12-25 MED ORDER — METFORMIN HCL 500 MG PO TABS
500.0000 mg | ORAL_TABLET | Freq: Every day | ORAL | 0 refills | Status: DC
Start: 2020-12-25 — End: 2021-03-05

## 2020-12-25 MED ORDER — ATENOLOL 25 MG PO TABS
ORAL_TABLET | ORAL | 0 refills | Status: DC
Start: 2020-12-25 — End: 2021-04-28

## 2020-12-25 MED ORDER — FUROSEMIDE 40 MG PO TABS
40.0000 mg | ORAL_TABLET | Freq: Two times a day (BID) | ORAL | 0 refills | Status: DC
Start: 2020-12-25 — End: 2021-02-06

## 2020-12-25 MED ORDER — ROSUVASTATIN CALCIUM 20 MG PO TABS: 20.0000 mg | ORAL_TABLET | Freq: Every day | ORAL | 3 refills | Status: DC

## 2020-12-25 MED ORDER — SUMATRIPTAN-NAPROXEN SODIUM 85-500 MG PO TABS
ORAL_TABLET | ORAL | 8 refills | Status: DC
Start: 2020-12-25 — End: 2022-05-11

## 2020-12-25 MED ORDER — OMEPRAZOLE 20 MG PO CPDR
20.0000 mg | DELAYED_RELEASE_CAPSULE | Freq: Every day | ORAL | 0 refills | Status: DC
Start: 2020-12-25 — End: 2021-02-06

## 2020-12-25 NOTE — Telephone Encounter (Signed)
Faxed in today. 

## 2021-01-01 ENCOUNTER — Other Ambulatory Visit: Payer: Self-pay

## 2021-01-01 ENCOUNTER — Encounter: Payer: Self-pay | Admitting: Registered"

## 2021-01-01 ENCOUNTER — Encounter: Payer: Medicare Other | Attending: Internal Medicine | Admitting: Registered"

## 2021-01-01 DIAGNOSIS — E119 Type 2 diabetes mellitus without complications: Secondary | ICD-10-CM | POA: Insufficient documentation

## 2021-01-01 NOTE — Progress Notes (Signed)
Diabetes Self-Management Education  Visit Type:  First/Initial  Appt. Start Time: 11:25 Appt. End Time: 12:30  01/01/2021  [name], identified by name and date of birth, is a 65 y.o. female with a diagnosis of Diabetes: Type 2.   ASSESSMENT  States she does not have a therapist. Reports she has bursitis in hips and taking steroids. Also takes steroids for back.   States she doesn't hardly eat. States she has a poor diet. States her eating habits changed when pandemic started. Reports she will find herself snacking on snacks. States she is not hungry and will not eat.  States she has lost close family members in the last 7 years - mom, son, and brother. States she had a panic attack this morning and didn't want to come to appointment today. States husband has diabetes and eats whatever he wants to eat.   Wants to lose weight to be more active with 8 grandchildren and 4 great-grandchildren.   Checks 1-2x/day: FBS (111-128) and before bed (150-159).   States she can only do water activities due to her toe and knee.   Pt expectation: good plan to stick with, wants to lose weight   There were no vitals taken for this visit. There is no height or weight on file to calculate BMI.    Diabetes Self-Management Education - 01/01/21 1135      Health Coping   How would you rate your overall health? Fair      Psychosocial Assessment   Patient Belief/Attitude about Diabetes Motivated to manage diabetes    Self-care barriers None    Self-management support Doctor's office    Patient Concerns Nutrition/Meal planning    Special Needs None    Preferred Learning Style No preference indicated    Learning Readiness Ready      Complications   Last HgB A1C per patient/outside source 6.6 %    How often do you check your blood sugar? 1-2 times/day    Fasting Blood glucose range (mg/dL) 70-129    Postprandial Blood glucose range (mg/dL) 130-179    Number of hypoglycemic episodes per  month 4    Can you tell when your blood sugar is low? Yes    What do you do if your blood sugar is low? drinks ginger ale + eat a PB sandwich    Number of hyperglycemic episodes per week 0    Have you had a dilated eye exam in the past 12 months? Yes    Have you had a dental exam in the past 12 months? Yes    Are you checking your feet? Yes    How many days per week are you checking your feet? 7      Dietary Intake   Breakfast croissant with ham and cheese    Lunch pasta salad    Dinner fried chicken wing + broccoli casserole (mayo, cream of chicken, broccoli, cheese)    Beverage(s) water (4*16 oz; 64 oz), small amount of ginger ale      Exercise   Exercise Type ADL's    How many days per week to you exercise? 0    How many minutes per day do you exercise? 0    Total minutes per week of exercise 0      Patient Education   Previous Diabetes Education No    Disease state  Definition of diabetes, type 1 and 2, and the diagnosis of diabetes;Factors that contribute to the development of diabetes  Nutrition management  Role of diet in the treatment of diabetes and the relationship between the three main macronutrients and blood glucose level;Food label reading, portion sizes and measuring food.;Effects of alcohol on blood glucose and safety factors with consumption of alcohol.    Physical activity and exercise  Role of exercise on diabetes management, blood pressure control and cardiac health.    Monitoring Purpose and frequency of SMBG.;Interpreting lab values - A1C, lipid, urine microalbumina.;Identified appropriate SMBG and/or A1C goals.    Acute complications Taught treatment of hypoglycemia - the 15 rule.;Discussed and identified patients' treatment of hyperglycemia.    Chronic complications Relationship between chronic complications and blood glucose control;Lipid levels, blood glucose control and heart disease;Applicable immunizations;Reviewed with patient heart disease, higher risk  of, and prevention    Psychosocial adjustment Role of stress on diabetes;Identified and addressed patients feelings and concerns about diabetes      Individualized Goals (developed by patient)   Nutrition General guidelines for healthy choices and portions discussed    Physical Activity Not Applicable    Medications take my medication as prescribed    Monitoring  test my blood glucose as discussed    Reducing Risk examine blood glucose patterns;treat hypoglycemia with 15 grams of carbs if blood glucose less than 70mg /dL      Post-Education Assessment   Patient understands the diabetes disease and treatment process. Demonstrates understanding / competency    Patient understands incorporating nutritional management into lifestyle. Demonstrates understanding / competency    Patient undertands incorporating physical activity into lifestyle. Needs Review    Patient understands using medications safely. Demonstrates understanding / competency    Patient understands monitoring blood glucose, interpreting and using results Demonstrates understanding / competency    Patient understands prevention, detection, and treatment of acute complications. Demonstrates understanding / competency    Patient understands prevention, detection, and treatment of chronic complications. Demonstrates understanding / competency    Patient understands how to develop strategies to address psychosocial issues. Demonstrates understanding / competency    Patient understands how to develop strategies to promote health/change behavior. Demonstrates understanding / competency      Outcomes   Program Status Not Completed           Learning Objective:  Patient will have a greater understanding of diabetes self-management. Patient education plan is to attend individual and/or group sessions per assessed needs and concerns.   Plan:   Patient Instructions  Goals:  Follow Diabetes Meal Plan as instructed  Eat 3 meals  and 2 snacks, every 3-5 hrs  Aim to have 1/2 plate of non-starchy vegetables + 1/4 plate of lean protein + 1/4 plate of carbohydrates  Snacks should include a source of carbohydrates + protein  Add lean protein foods to meals/snacks  Monitor glucose levels as instructed by your doctor  Bring food record and glucose log to your next nutrition visit     Expected Outcomes:  Demonstrated interest in learning. Expect positive outcomes  Education material provided: ADA - How to Thrive: A Guide for Your Journey with Diabetes  If problems or questions, patient to contact team via:  Phone and Email  Future DSME appointment: - 4-6 wks

## 2021-01-01 NOTE — Patient Instructions (Signed)
Goals:  Follow Diabetes Meal Plan as instructed  Eat 3 meals and 2 snacks, every 3-5 hrs  Aim to have 1/2 plate of non-starchy vegetables + 1/4 plate of lean protein + 1/4 plate of carbohydrates  Snacks should include a source of carbohydrates + protein  Add lean protein foods to meals/snacks  Monitor glucose levels as instructed by your doctor  Bring food record and glucose log to your next nutrition visit

## 2021-01-05 DIAGNOSIS — Z13820 Encounter for screening for osteoporosis: Secondary | ICD-10-CM | POA: Diagnosis not present

## 2021-01-05 DIAGNOSIS — Z1231 Encounter for screening mammogram for malignant neoplasm of breast: Secondary | ICD-10-CM | POA: Diagnosis not present

## 2021-01-05 LAB — HM MAMMOGRAPHY

## 2021-01-06 ENCOUNTER — Other Ambulatory Visit: Payer: Self-pay | Admitting: Obstetrics and Gynecology

## 2021-01-06 DIAGNOSIS — Z13820 Encounter for screening for osteoporosis: Secondary | ICD-10-CM

## 2021-01-15 ENCOUNTER — Encounter: Payer: Self-pay | Admitting: Internal Medicine

## 2021-01-15 NOTE — Progress Notes (Signed)
Outside notes received. Information abstracted. Notes sent to scan.  

## 2021-01-21 ENCOUNTER — Telehealth: Payer: Self-pay

## 2021-01-21 NOTE — Progress Notes (Signed)
Chronic Care Management Pharmacy Assistant   Name: Erica [name]  MRN: [id number] DOB: [birth year]   Reason for Encounter: Initial Questions Appointment:Telephone  01/22/21 @ 12:30 pm    Recent office visits:  12/08/20 Erica Rouge, MD - PCP   Recent consult visits:  01/01/21 Erica [name] - Nutrition 12/19/20 Erica Mead, MD - Pulmonary 12/24/20 Erica [name], [facility] 12/24/20 Erica Latch, MD - Cardiology 09/19/20 Erica Breeding, MD-Cardiology  Hospital visits: 2 visits 09/14/20 [facility] - Contusion 09/12/20 [place]  Medication Reconciliation was completed by comparing discharge summary, patient's EMR and Pharmacy list, and upon discussion with patient.  Admitted to the hospital on 09/14/20 due to contusion. Discharge date was 09/14/20. Discharged from [facility].    Medications that remain the same after Hospital Discharge:??  -All other medications will remain the same.    Medications: Outpatient Encounter Medications as of 01/21/2021  Medication Sig  . albuterol (VENTOLIN HFA) 108 (90 Base) MCG/ACT inhaler Inhale 2 puffs into the lungs every 6 (six) hours as needed for wheezing or shortness of breath.  . ALPRAZolam (XANAX) 1 MG tablet Take 1 mg by mouth as needed for anxiety.  Marland Kitchen atenolol (TENORMIN) 25 MG tablet Take 25m (1 tablet) in the AM and 522m(2 tablets) in the PM  . blood glucose meter kit and supplies KIT Dispense based on patient and insurance preference. Use up to four times daily as directed. (FOR E11.9).  . camphor-menthol (SARNA) lotion Apply 1 application topically as needed for itching.  . fluticasone (FLONASE) 50 MCG/ACT nasal spray Place 2 sprays into both nostrils daily.  . furosemide (LASIX) 40 MG tablet Take 1 tablet (40 mg total) by mouth 2 (two) times daily.  . Marland KitchenYDROcodone-acetaminophen (NORCO/VICODIN) 5-325 MG tablet Take 1 tablet by mouth every 4 (four) hours as needed.  .  hydrocortisone (ANUSOL-HC) 2.5 % rectal cream Place rectally as needed.  . hydrOXYzine (ATARAX/VISTARIL) 10 MG tablet Take 1 tablet (10 mg total) by mouth 3 (three) times daily as needed for itching.  . levocetirizine (XYZAL) 5 MG tablet Take 1 tablet (5 mg total) by mouth every evening.  . linaclotide (LINZESS) 290 MCG CAPS capsule Take 1 capsule (290 mcg total) by mouth daily before breakfast. 2 week supply until she receive mail order  . metFORMIN (GLUCOPHAGE) 500 MG tablet Take 1 tablet (500 mg total) by mouth daily with breakfast. 2 week supply until she receive mail order  . Multiple Vitamins-Minerals (MULTIVITAMIN WITH MINERALS) tablet Take 1 tablet by mouth daily.  . Marland Kitchenmeprazole (PRILOSEC) 20 MG capsule Take 1 capsule (20 mg total) by mouth daily. 2 week supply until she receive mail order  . ondansetron (ZOFRAN) 4 MG tablet Take 1 tablet (4 mg total) by mouth every 8 (eight) hours as needed for nausea or vomiting.  . potassium chloride SA (KLOR-CON) 20 MEQ tablet Take 1 tablet (20 mEq total) by mouth 2 (two) times daily. 2 week supply until she receive mail order (Patient taking differently: Take 20 mEq by mouth daily. 1 tablet by mouth daily)  . propranolol (INDERAL) 10 MG tablet Take 1 tablet (10 mg total) by mouth 3 (three) times daily as needed (PALPITATIONS).  . rosuvastatin (CRESTOR) 20 MG tablet Take 1 tablet (20 mg total) by mouth daily.  . Semaglutide,0.25 or 0.5MG/DOS, (OZEMPIC, 0.25 OR 0.5 MG/DOSE,) 2 MG/1.5ML SOPN Inject 0.25 mg into the skin once a week for 30 days, THEN  0.5 mg once a week.  . SUMAtriptan-naproxen (TREXIMET) 85-500 MG tablet Take 1 pill as needed for migraine, if migraine persists may repeat x1 after 2 hours  . tiZANidine (ZANAFLEX) 4 MG tablet TAKE 1 TABLET BY MOUTH AT BEDTIME  . venlafaxine XR (EFFEXOR-XR) 150 MG 24 hr capsule Take 1 capsule (150 mg total) by mouth 2 (two) times daily.  . Vitamin D, Ergocalciferol, (DRISDOL) 1.25 MG (50000 UNIT) CAPS capsule  Take 1 capsule (50,000 Units total) by mouth every 7 (seven) days.  . [DISCONTINUED] oxycodone (OXY-IR) 5 MG capsule Take 5 mg by mouth every 4 (four) hours as needed. For pain.   No facility-administered encounter medications on file as of 01/21/2021.    Have you seen any other providers since your last visit?    Any changes in your medications or health? {   Any side effects from any medications?    Do you have an symptoms or problems not managed by your medications?    Any concerns about your health right now?    Has your provider asked that you check blood pressure, blood sugar, or follow special diet at home?    Do you get any type of exercise on a regular basis?    Can you think of a goal you would like to reach for your health?    Do you have any problems getting your medications?    Is there anything that you would like to discuss during the appointment?   Please bring medications and supplements to appointment   Star Rating Drugs: Rosuvastatin last fill:12/09/20 Erica [name], RMA Clinical Pharmacists Assistant [phone]  Unable to reach patient before appointment with CPP. Time Spent: 42

## 2021-01-21 NOTE — Telephone Encounter (Signed)
No record of monitor being returned to [facility].

## 2021-01-22 ENCOUNTER — Ambulatory Visit (INDEPENDENT_AMBULATORY_CARE_PROVIDER_SITE_OTHER): Payer: Medicare Other | Admitting: Pharmacist

## 2021-01-22 ENCOUNTER — Other Ambulatory Visit: Payer: Self-pay

## 2021-01-22 DIAGNOSIS — E119 Type 2 diabetes mellitus without complications: Secondary | ICD-10-CM

## 2021-01-22 DIAGNOSIS — E7849 Other hyperlipidemia: Secondary | ICD-10-CM

## 2021-01-22 DIAGNOSIS — F32A Depression, unspecified: Secondary | ICD-10-CM | POA: Diagnosis not present

## 2021-01-22 DIAGNOSIS — F419 Anxiety disorder, unspecified: Secondary | ICD-10-CM | POA: Diagnosis not present

## 2021-01-22 DIAGNOSIS — I10 Essential (primary) hypertension: Secondary | ICD-10-CM | POA: Diagnosis not present

## 2021-01-22 DIAGNOSIS — K219 Gastro-esophageal reflux disease without esophagitis: Secondary | ICD-10-CM

## 2021-01-22 DIAGNOSIS — G43001 Migraine without aura, not intractable, with status migrainosus: Secondary | ICD-10-CM

## 2021-01-22 DIAGNOSIS — K5909 Other constipation: Secondary | ICD-10-CM

## 2021-01-22 NOTE — Progress Notes (Signed)
Chronic Care Management Pharmacy Note  01/22/2021 Name:  ANGINETTE ESPEJO MRN:  [id number] DOB:  1956-09-14  Subjective: CHELCEE KORPI is an 65 y.o. year old female who is a primary patient of Burns, Claudina Lick, MD.  The CCM team was consulted for assistance with disease management and care coordination needs.    Engaged with patient by telephone for initial visit in response to provider referral for pharmacy case management and/or care coordination services.   Consent to Services:  The patient was given the following information about Chronic Care Management services today, agreed to services, and gave verbal consent: 1. CCM service includes personalized support from designated clinical staff supervised by the primary care provider, including individualized plan of care and coordination with other care providers 2. 24/7 contact phone numbers for assistance for urgent and routine care needs. 3. Service will only be billed when office clinical staff spend 20 minutes or more in a month to coordinate care. 4. Only one practitioner may furnish and bill the service in a calendar month. 5.The patient may stop CCM services at any time (effective at the end of the month) by phone call to the office staff. 6. The patient will be responsible for cost sharing (co-pay) of up to 20% of the service fee (after annual deductible is met). Patient agreed to services and consent obtained.  Patient Care Team: [name], MD as PCP - General (Internal Medicine) Lafayette Dragon, MD (Inactive) as Consulting Physician (Gastroenterology) Azucena Fallen, MD as Consulting Physician (Obstetrics and Gynecology) Suella Broad, MD as Consulting Physician (Physical Medicine and Rehabilitation) Gaynelle Arabian, MD as Consulting Physician (Orthopedic Surgery) Chesley Mires, MD (Pulmonary Disease) Charlton Haws, [facility] as Pharmacist (Pharmacist)   Patient lives at home with husband. She is retired and has avoided  public places during the pandemic. She is thinking about going back to the aquatic center for exercise soon.  Recent office visits: 12/08/20 Dr Quay Burow OV: CPE. Referred to dietician. Rx'd Ozempic. Discussed OTC stool softeners in addition to Linzess.  Recent consult visits: 01/01/21 RD Tyrone Nine (nutrition): DM counseling 12/24/20 Dr Tamala Julian (sports medicine): steroid injection 12/19/20 Dr [name] (pulmonary): f/u OSA 09/19/20 Dr Percival Spanish (cardiology): f/u abnormal stress test. Rx'd propranolol PRN for palpitations. Changed simvastatin to rosuvastatin 20 to achieve LDL < 70  Hospital visits: None in previous 6 months  Objective:  Lab Results  Component Value Date   CREATININE 0.77 12/08/2020   BUN 21 12/08/2020   GFR 80.04 12/08/2020   GFRNONAA 80 06/02/2020   GFRAA 93 06/02/2020   NA 138 12/08/2020   K 4.2 12/08/2020   CALCIUM 9.9 12/08/2020   CO2 30 12/08/2020   GLUCOSE 102 (H) 12/08/2020    Lab Results  Component Value Date/Time   HGBA1C 6.6 (H) 12/08/2020 11:56 AM   HGBA1C 6.3 (H) 06/02/2020 10:13 AM   GFR 80.04 12/08/2020 11:56 AM   GFR 96.52 11/20/2019 10:12 AM   MICROALBUR 1.2 12/08/2020 11:56 AM   MICROALBUR 0.5 06/02/2020 10:13 AM    Last diabetic Eye exam:  Lab Results  Component Value Date/Time   HMDIABEYEEXA No Retinopathy 11/15/2017 12:00 AM   HMDIABEYEEXA No Retinopathy 11/15/2017 12:00 AM   HMDIABEYEEXA No Retinopathy 11/15/2017 12:00 AM    Last diabetic Foot exam: No results found for: HMDIABFOOTEX   Lab Results  Component Value Date   CHOL 139 12/08/2020   HDL 50.40 12/08/2020   LDLCALC 63 12/08/2020   TRIG 126.0 12/08/2020   CHOLHDL 3  12/08/2020    Hepatic Function Latest Ref Rng & Units 12/08/2020 06/02/2020 11/20/2019  Total Protein 6.0 - 8.3 g/dL 7.5 6.9 7.3  Albumin 3.5 - 5.2 g/dL 4.0 - 4.0  AST 0 - 37 U/L '17 14 14  ' ALT 0 - 35 U/L '14 13 12  ' Alk Phosphatase 39 - 117 U/L 139(H) - 157(H)  Total Bilirubin 0.2 - 1.2 mg/dL 0.3 0.2 0.2  Bilirubin, Direct  0.0 - 0.3 mg/dL - - -    Lab Results  Component Value Date/Time   TSH 2.717 05/11/2019 09:19 AM   TSH 2.020 02/09/2018 10:53 AM   TSH 2.39 09/27/2016 04:08 PM   FREET4 1.06 02/09/2018 10:53 AM    CBC Latest Ref Rng & Units 08/23/2019 05/11/2019 02/09/2018  WBC 4.0 - 10.5 K/uL 7.4 7.2 6.6  Hemoglobin 12.0 - 15.0 g/dL 12.2 13.0 12.4  Hematocrit 36.0 - 46.0 % 37.2 40.8 38.6  Platelets 150.0 - 400.0 K/uL 208.0 272 -    Lab Results  Component Value Date/Time   VD25OH 46.0 07/05/2018 12:00 PM   VD25OH 23.3 (L) 02/09/2018 10:53 AM    Clinical ASCVD: No  The 10-year ASCVD risk score Mikey Bussing DC Jr., et al., 2013) is: 16.9%   Values used to calculate the score:     Age: 75 years     Sex: Female     Is Non-Hispanic African American: Yes     Diabetic: Yes     Tobacco smoker: No     Systolic Blood Pressure: 117 mmHg     Is BP treated: Yes     HDL Cholesterol: 50.4 mg/dL     Total Cholesterol: 139 mg/dL    Depression screen [facility] 2/9 01/01/2021 12/08/2020 06/05/2020  Decreased Interest 0 1 1  Down, Depressed, Hopeless '1 1 1  ' PHQ - 2 Score '1 2 2  ' Altered sleeping - 2 1  Tired, decreased energy - 2 2  Change in appetite - 2 3  Feeling bad or failure about yourself  - 3 0  Trouble concentrating - 2 2  Moving slowly or fidgety/restless - 0 1  Suicidal thoughts - 0 0  PHQ-9 Score - 13 11  Difficult doing work/chores - Somewhat difficult Somewhat difficult  Some recent data might be hidden    GAD 7 : Generalized Anxiety Score 09/27/2019 09/14/2019 07/05/2018 06/07/2018  Nervous, Anxious, on Edge '2 1 1 1  ' Control/stop worrying '2 1 1 1  ' Worry too much - different things '3 2 2 1  ' Trouble relaxing '1 1 1 1  ' Restless '1 1 1 ' 0  Easily annoyed or irritable 0 2 0 1  Afraid - awful might happen 1 1 0 1  Total GAD 7 Score '10 9 6 6  ' Anxiety Difficulty Not difficult at all - Not difficult at all Somewhat difficult    Social History   Tobacco Use  Smoking Status Never Smoker  Smokeless Tobacco  Never Used  Tobacco Comment   tried to as a teenager   BP Readings from Last 3 Encounters:  12/24/20 128/82  12/19/20 124/80  12/08/20 136/82   Pulse Readings from Last 3 Encounters:  12/24/20 77  12/19/20 72  12/08/20 76   Wt Readings from Last 3 Encounters:  12/19/20 259 lb (117.5 kg)  12/08/20 259 lb (117.5 kg)  09/19/20 248 lb (112.5 kg)   BMI Readings from Last 3 Encounters:  12/24/20 41.80 kg/m  12/19/20 41.80 kg/m  12/08/20 41.80 kg/m    Assessment/Interventions:  Review of patient past medical history, allergies, medications, health status, including review of consultants reports, laboratory and other test data, was performed as part of comprehensive evaluation and provision of chronic care management services.   SDOH:  (Social Determinants of Health) assessments and interventions performed: Yes SDOH Interventions   Flowsheet Row Most Recent Value  SDOH Interventions   Financial Strain Interventions Intervention Not Indicated     SDOH Screenings   Alcohol Screen: Not on file  Depression (PHQ2-9): Low Risk   . PHQ-2 Score: 1  Financial Resource Strain: Low Risk   . Difficulty of Paying Living Expenses: Not hard at all  Food Insecurity: Not on file  Housing: Not on file  Physical Activity: Not on file  Social Connections: Not on file  Stress: Not on file  Tobacco Use: Low Risk   . Smoking Tobacco Use: Never Smoker  . Smokeless Tobacco Use: Never Used  Transportation Needs: Not on file    CCM Care Plan  Allergies  Allergen Reactions  . Seroquel [Quetiapine Fumarate]     Hallucinations, palpitations  . Codeine Nausea And Vomiting  . Guaifenesin-Codeine Other (See Comments)    REACTION: feels spacey  . Wellbutrin [Bupropion] Palpitations    hallucinations    Medications Reviewed Today    Reviewed by Charlton Haws, [facility] (Pharmacist) on 01/22/21 at 1337  Med List Status: <None>  Medication Order Taking? Sig Documenting Provider Last Dose  Status Informant  albuterol (VENTOLIN HFA) 108 (90 Base) MCG/ACT inhaler [id number] Yes Inhale 2 puffs into the lungs every 6 (six) hours as needed for wheezing or shortness of breath. [name], MD Taking Active   ALPRAZolam Duanne Moron) 1 MG tablet [id number] Yes Take 1 mg by mouth as needed for anxiety. [provider] Taking Active   atenolol (TENORMIN) 25 MG tablet [id number] Yes Take 49m (1 tablet) in the AM and 522m(2 tablets) in the PM BuBinnie RailMD Taking Active   blood glucose meter kit and supplies KIT [id number] Dispense based on patient and insurance preference. Use up to four times daily as directed. (FOR E11.9). BuBinnie RailMD Taking Active   fluticasone ([facility] MCG/ACT nasal spray [id number] Place 2 sprays into both nostrils daily. BuBinnie RailMD Taking Active   furosemide (LASIX) 40 MG tablet [id number] Take 1 tablet (40 mg total) by mouth 2 (two) times daily. BuBinnie RailMD Taking Active   hydrOXYzine (ATARAX/VISTARIL) 10 MG tablet [id number] Take 1 tablet (10 mg total) by mouth 3 (three) times daily as needed for itching. YuOk EdwardsPA-C Taking Active   levocetirizine (XYZAL) 5 MG tablet [id number] Take 1 tablet (5 mg total) by mouth every evening. BuBinnie RailMD Taking Active   linaclotide (LRolan Lipa290 MCG CAPS capsule [id number] Take 1 capsule (290 mcg total) by mouth daily before breakfast. 2 week supply until she receive mail order BuBinnie RailMD Taking Active   metFORMIN (GLUCOPHAGE) 500 MG tablet [id number] Take 1 tablet (500 mg total) by mouth daily with breakfast. 2 week supply until she receive mail order BuBinnie RailMD Taking Active   Multiple Vitamins-Minerals (MULTIVITAMIN WITH MINERALS) tablet [id number] Take 1 tablet by mouth daily. [provider] Taking Active Self  omeprazole (PRILOSEC) 20 MG capsule [id number] Take 1 capsule (20 mg total) by mouth daily. 2 week supply until she receive mail  order BuBinnie RailMD Taking Active  ondansetron (ZOFRAN) 4 MG tablet [id number] Yes Take 1 tablet (4 mg total) by mouth every 8 (eight) hours as needed for nausea or vomiting. [name], MD Taking Active         Discontinued 02/27/15 1924   potassium chloride SA (KLOR-CON) 20 MEQ tablet [id number] Yes Take 1 tablet (20 mEq total) by mouth 2 (two) times daily. 2 week supply until she receive mail order  Patient taking differently: Take 20 mEq by mouth daily. 1 tablet by mouth daily   Burns, Claudina Lick, MD Taking Active   propranolol (INDERAL) 10 MG tablet [id number] Yes Take 1 tablet (10 mg total) by mouth 3 (three) times daily as needed (PALPITATIONS). Minus Breeding, MD Taking Active   rosuvastatin (CRESTOR) 20 MG tablet [id number] Yes Take 1 tablet (20 mg total) by mouth daily. [name], MD Taking Active   Semaglutide,0.25 or 0.5MG/DOS, (OZEMPIC, 0.25 OR 0.5 MG/DOSE,) 2 MG/1.5ML SOPN [id number] Yes Inject 0.25 mg into the skin once a week for 30 days, THEN 0.5 mg once a week. [name], MD Taking Active   SUMAtriptan-naproxen [facility]) 85-500 MG tablet [id number] Yes Take 1 pill as needed for migraine, if migraine persists may repeat x1 after 2 hours [name], MD Taking Active   tiZANidine (ZANAFLEX) 4 MG tablet [id number] Yes TAKE 1 TABLET BY MOUTH AT BEDTIME [name] M, DO Taking Active   venlafaxine XR (EFFEXOR-XR) 150 MG 24 hr capsule [id number] Yes Take 1 capsule (150 mg total) by mouth 2 (two) times daily. [name], MD Taking Active   Vitamin D, Ergocalciferol, (DRISDOL) 1.25 MG (50000 UNIT) CAPS capsule [id number] Yes Take 1 capsule (50,000 Units total) by mouth every 7 (seven) days. Lyndal Pulley, DO Taking Active   Med List Note Carlota Raspberry, CPhT 09/15/11 1928): [facility] [place] patient.          Patient Active Problem List   Diagnosis Date Noted  . Morbid obesity ([place]) 12/08/2020  . Stenosis of carotid artery 09/18/2020  . Greater  trochanteric bursitis of both hips 08/25/2020  . Greater trochanteric bursitis of right hip 05/05/2020  . 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 Chronic constipation 08/23/2019  . Abdominal pain 08/23/2019  . Bilateral carotid artery stenosis 06/04/2019  . Osteopenia 05/19/2019  . Left knee pain 12/11/2018  . Class 2 severe obesity with serious comorbidity and body mass index (BMI) of 37.0 to 37.9 in adult ([place]) 08/31/2018  . Degeneration of lumbar intervertebral disc 05/30/2018  . Onychomycosis 12/07/2017  . Left hip pain 06/12/2017  . Acute left-sided low back pain without sciatica 06/12/2017  . Meningioma ([place]) 03/28/2017  . Panic attacks 09/27/2016  . GERD (gastroesophageal reflux disease) 09/27/2016  . Right ankle pain 08/19/2016  . Diabetes ([place]) 02/15/2016  . Lump of skin 11/28/2015  . Migraine without aura and with status migrainosus, not intractable 10/03/2015  . Numbness of left hand 10/03/2015  . Cephalalgia 10/03/2015  . Palpitations 09/08/2012  . Sciatica of left side   . Peripheral edema 05/09/2012  . FATIGUE 02/23/2010  . Obstructive sleep apnea 10/31/2009  . External hemorrhoids 10/17/2009  . Hyperlipidemia 01/12/2008  . Depression, major, recurrent ([place]) 01/12/2008  . Essential hypertension, benign 01/12/2008  . Allergic rhinitis 01/12/2008    Immunization History  Administered Date(s) Administered  . Fluad Quad(high Dose 65+) 07/14/2019, 10/31/2020  . Influenza,inj,Quad PF,6+ Mos 07/25/2013, 09/09/2014, 08/22/2015, 09/27/2016, 09/27/2017, 08/01/2018  . PFIZER(Purple Top)SARS-COV-2  Vaccination 12/19/2019, 01/09/2020, 08/05/2020  . Pneumococcal Conjugate-13 05/18/2019  . Pneumococcal Polysaccharide-23 01/30/2009, 06/02/2020  . Td 03/17/2004  . Tdap 09/09/2014  . Zoster Recombinat (Shingrix) 08/07/2019, 10/28/2019    Conditions to be addressed/monitored:  Hypertension, Hyperlipidemia, Diabetes, GERD, Depression,  Anxiety, Osteoarthritis and Allergic Rhinitis  Care Plan : CCM Pharmacy Care Plan  Updates made by Charlton Haws, [place] since 01/22/2021 12:00 AM    Problem: Hypertension, Hyperlipidemia, Diabetes, GERD, Depression, Anxiety, Osteoarthritis and Allergic Rhinitis   Priority: High    Long-Range Goal: Disease management   Start Date: 01/22/2021  Expected End Date: 07/24/2021  This Visit's Progress: On track  Priority: High  Note:   Current Barriers:  . Unable to independently monitor therapeutic efficacy  Pharmacist Clinical Goal(s):  Marland Kitchen Patient will achieve adherence to monitoring guidelines and medication adherence to achieve therapeutic efficacy through collaboration with PharmD and provider.   Interventions: . 1:1 collaboration with [name], MD regarding development and update of comprehensive plan of care as evidenced by provider attestation and co-signature . Inter-disciplinary care team collaboration (see longitudinal plan of care) . Comprehensive medication review performed; medication list updated in electronic medical record  Hypertension (BP goal <130/80) -Controlled - home BP at goal per patient -used propranolol about 4 times since December  -Current treatment: . Atenolol 25 mg AM and 50 mg PM  . Furosemide 40 mg BID  . Propranolol 10 mg TID prn (palpitations) -Current home readings: 120/80 -Denies hypotensive/hypertensive symptoms -Educated on BP goals and benefits of medications for prevention of heart attack, stroke and kidney damage; Importance of home blood pressure monitoring; -Counseled to monitor BP at home daily, document, and provide log at future appointments -Recommended to continue current medication   Hyperlipidemia: (LDL goal < 70) -Controlled - LDL at goal after switching simvastatin to rosuvastatin  -Current treatment: . Rosuvastatin 20 mg daily -Educated on Cholesterol goals;  Benefits of statin for ASCVD risk reduction; -Recommended to  continue current medication  Diabetes (A1c goal <7%) -Controlled - A1c at goal, pt has just started Ozempic and is tolerating well -Current medications: Marland Kitchen Metformin 500 mg daily . Ozempic 0.25 mg weekly -Current home glucose readings . fasting glucose: 100-120 -Denies hypoglycemic/hyperglycemic symptoms -Current meal patterns: seen nutritionist -Current exercise: walking around neighborhood; planning to restart at aquatic center -Educated on A1c and blood sugar goals; Exercise goal of 150 minutes per week; Benefits of weight loss; -Counseled to check feet daily and get yearly eye exams -Recommended to continue current medication  Depression/Anxiety (Goal: manage symptoms)  -Controlled - pt reports symptoms are controlled -pt reports hydroxyzine helps with allergies too -Current treatment: . Venlafaxine XR 150 mg BID . Alprazolam 1 mg PRN - QD-BID . Hydroxyzine 10 mg TID - HS -PHQ9: 1 (12/2020) -GAD7: 12 (09/2019) -Connected with psyhiatrist for mental health support -Recommended to continue current medication  Constipation (Goal: manage symptoms) -Controlled - pt reports constipation is relatively controlled, some days worse than others; she is not currently taking any fiber but reports drinking plenty of water -Current treatment  . Linzess 290 mcg  -Recommend fiber supplement  GERD (Goal: manage symptoms) -Controlled -Current treatment  . Omeprazole 20 mg daily -Patient is satisfied with current regimen and denies issues -Recommended to continue current medication  Allergic rhinitis (Goal: manage symptoms) -Controlled - Patient is satisfied with current regimen and denies issues -Current treatment  . Fluticasone nasal spray . Levocetirizine 5 mg HS . Albuterol HFA prn -Recommended to continue current medication -  pt needs refill for Xyzal and albuterol inhaler  Migraine (Goal: manage symptoms) -Controlled - pt reports she has not had a migraine in a few  months -Current treatment  . Sumatriptan-naproxen (Treximet) 85-500 mg PRN -Recommended to continue current medication  Back pain (Goal: manage pain) -Controlled  - pt is starting PT this month -Current treatment  . Tizanidine 4 mg - few times a week -Recommended to continue current medication  Health Maintenance -Vaccine gaps: none -Current therapy:  . Multivitamin  . Vitamin C . Turmeric 1200 mg daily . Klor-Con 20 mEq BID . Vitamin D 50,000 IU weekly -Patient is satisfied with current therapy and denies issues -Recommended to continue current medication  Patient Goals/Self-Care Activities . Patient will:  - take medications as prescribed focus on medication adherence by pill box check glucose daily, document, and provide at future appointments check blood pressure daily, document, and provide at future appointments  Follow Up Plan: Telephone follow up appointment with care management team member scheduled for: 6 months      Medication Assistance: None required.  Patient affirms current coverage meets needs.  Patient's preferred pharmacy is:  [id number] [place] [place] [address]Phone: [phone]: [phone] [facility] [address] [place] [address]Phone: [phone]: [phone] Uses pill box? Yes Pt endorses 100% compliance  We discussed: Current pharmacy is preferred with insurance plan and patient is satisfied with pharmacy services Patient decided to: Continue current medication management strategy  Care Plan and Follow Up Patient Decision:  Patient agrees to Care Plan and Follow-up.  Plan: Telephone follow up appointment with care management team member scheduled for:  6 months  LCharlene Brooke PharmD, [place]augh [place] CPP Clinical Pharmacist LLamarPrimary Care at [phone]

## 2021-01-22 NOTE — Patient Instructions (Addendum)
Visit Information  Phone number for Pharmacist: [phone]  Thank you for meeting with me to discuss your medications! I look forward to working with you to achieve your health care goals. Below is a summary of what we talked about during the visit:  Goals Addressed            This Visit's Progress   . Manage My Medicine       Timeframe:  Long-Range Goal Priority:  Medium Start Date:       01/22/21                      Expected End Date:   08/09/21                    Follow Up Date 05/09/21   - call for medicine refill 2 or 3 days before it runs out - call if I am sick and can't take my medicine - keep a list of all the medicines I take; vitamins and herbals too  -Start taking fiber supplement   Why is this important?   . These steps will help you keep on track with your medicines.   Notes:       Patient Care Plan: CCM Pharmacy Care Plan    Problem Identified: Hypertension, Hyperlipidemia, Diabetes, GERD, Depression, Anxiety, Osteoarthritis and Allergic Rhinitis   Priority: High    Long-Range Goal: Disease management   Start Date: 01/22/2021  Expected End Date: 07/24/2021  This Visit's Progress: On track  Priority: High  Note:   Current Barriers:  . Unable to independently monitor therapeutic efficacy  Pharmacist Clinical Goal(s):  Marland Kitchen Patient will achieve adherence to monitoring guidelines and medication adherence to achieve therapeutic efficacy through collaboration with PharmD and provider.   Interventions: . 1:1 collaboration with Erica Rail, MD regarding development and update of comprehensive plan of care as evidenced by provider attestation and co-signature . Inter-disciplinary care team collaboration (see longitudinal plan of care) . Comprehensive medication review performed; medication list updated in electronic medical record  Hypertension (BP goal <130/80) -Controlled - home BP at goal per patient -used propranolol about 4 times since December   -Current treatment: . Atenolol 25 mg AM and 50 mg PM  . Furosemide 40 mg BID  . Propranolol 10 mg TID prn (palpitations) -Current home readings: 120/80 -Denies hypotensive/hypertensive symptoms -Educated on BP goals and benefits of medications for prevention of heart attack, stroke and kidney damage; Importance of home blood pressure monitoring; -Counseled to monitor BP at home daily, document, and provide log at future appointments -Recommended to continue current medication   Hyperlipidemia: (LDL goal < 70) -Controlled - LDL at goal after switching simvastatin to rosuvastatin  -Current treatment: . Rosuvastatin 20 mg daily -Educated on Cholesterol goals;  Benefits of statin for ASCVD risk reduction; -Recommended to continue current medication  Diabetes (A1c goal <7%) -Controlled - A1c at goal, pt has just started Ozempic and is tolerating well -Current medications: Marland Kitchen Metformin 500 mg daily . Ozempic 0.25 mg weekly -Current home glucose readings . fasting glucose: 100-120 -Denies hypoglycemic/hyperglycemic symptoms -Current meal patterns: seen nutritionist -Current exercise: walking around neighborhood; planning to restart at aquatic center -Educated on A1c and blood sugar goals; Exercise goal of 150 minutes per week; Benefits of weight loss; -Counseled to check feet daily and get yearly eye exams -Recommended to continue current medication  Depression/Anxiety (Goal: manage symptoms)  -Controlled - pt reports symptoms are controlled -pt reports hydroxyzine helps  with allergies too -Current treatment: . Venlafaxine XR 150 mg BID . Alprazolam 1 mg PRN - QD-BID . Hydroxyzine 10 mg TID - HS -PHQ9: 1 (12/2020) -GAD7: 12 (09/2019) -Connected with psyhiatrist for mental health support -Recommended to continue current medication  Constipation (Goal: manage symptoms) -Controlled - pt reports constipation is relatively controlled, some days worse than others; she is not  currently taking any fiber but reports drinking plenty of water -Current treatment  . Linzess 290 mcg  -Recommend fiber supplement  GERD (Goal: manage symptoms) -Controlled -Current treatment  . Omeprazole 20 mg daily -Patient is satisfied with current regimen and denies issues -Recommended to continue current medication  Allergic rhinitis (Goal: manage symptoms) -Controlled - Patient is satisfied with current regimen and denies issues -Current treatment  . Fluticasone nasal spray . Levocetirizine 5 mg HS . Albuterol HFA prn -Recommended to continue current medication - pt needs refill for Xyzal and albuterol inhaler  Migraine (Goal: manage symptoms) -Controlled - pt reports she has not had a migraine in a few months -Current treatment  . Sumatriptan-naproxen (Treximet) 85-500 mg PRN -Recommended to continue current medication  Back pain (Goal: manage pain) -Controlled  - pt is starting PT this month -Current treatment  . Tizanidine 4 mg - few times a week -Recommended to continue current medication  Health Maintenance -Vaccine gaps: none -Current therapy:  . Multivitamin  . Vitamin C . Turmeric 1200 mg daily . Klor-Con 20 mEq BID . Vitamin D 50,000 IU weekly -Patient is satisfied with current therapy and denies issues -Recommended to continue current medication  Patient Goals/Self-Care Activities . Patient will:  - take medications as prescribed focus on medication adherence by pill box check glucose daily, document, and provide at future appointments check blood pressure daily, document, and provide at future appointments  Follow Up Plan: Telephone follow up appointment with care management team member scheduled for: 6 months      Erica [name] was given information about Chronic Care Management services today including:  1. CCM service includes personalized support from designated clinical staff supervised by her physician, including individualized plan of care  and coordination with other care providers 2. 24/7 contact phone numbers for assistance for urgent and routine care needs. 3. Standard insurance, coinsurance, copays and deductibles apply for chronic care management only during months in which we provide at least 20 minutes of these services. Most insurances cover these services at 100%, however patients may be responsible for any copay, coinsurance and/or deductible if applicable. This service may help you avoid the need for more expensive face-to-face services. 4. Only one practitioner may furnish and bill the service in a calendar month. 5. The patient may stop CCM services at any time (effective at the end of the month) by phone call to the office staff.  Patient agreed to services and verbal consent obtained.   Patient verbalizes understanding of instructions provided today and agrees to view in [place].  Telephone follow up appointment with pharmacy team member scheduled for: 6 months  Charlene Brooke, PharmD, BCACP, CPP Clinical Pharmacist [facility] [phone]  Chronic Constipation Chronic constipation is a condition in which a person has three or fewer bowel movements a week, for 3 months or longer. This condition is especially common in older adults. What are the causes? Causes of chronic constipation may include:  Not drinking enough fluid, eating enough food or fiber, or getting enough physical activity.  Pregnancy.  A tear in the anus (anal  fissure).  Blockage in the bowel (bowel obstruction).  Narrowing of the bowel (bowel stricture).  Having a long-term medical condition, such as: ? Diabetes, hypothyroidism, or iron-deficiency anemia. ? Stroke or spinal cord injury. ? Multiple sclerosis or Parkinson's disease. ? Colon cancer. ? Dementia. ? Inflammatory bowel disease (IBD), outward collapse of the rectum (rectal prolapse), or hemorrhoids.  Taking certain medicines,  including: ? Narcotics. These are a certain type of prescription pain medicine. ? Antacids or iron supplements. ? Water pills (diuretics). ? Certain blood pressure medicines. ? Anti-seizure medicines. ? Antidepressants. ? Medicines for Parkinson's disease. Other causes of this condition may include:  Stress.  Problems in the nerves and muscles that control the movement of stool.  Weak or impaired pelvic floor muscles.   What increases the risk? You may be at higher risk for chronic constipation if:  You are older than age 13.  You are female.  You live in a long-term care facility.  You have a long-term disease.  You have a mental health disorder or eating disorder. What are the signs or symptoms? The main symptom of chronic constipation is having three or fewer bowel movements a week for several weeks. Other signs and symptoms may vary from person to person. These include:  Pushing hard (straining) to pass stool, or having hard or lumpy stools.  Painful bowel movements.  Having lower abdominal discomfort, such as cramps or bloating.  Being unable to have a bowel movement when you feel the urge, or feeling like you still need to pass stool after a bowel movement.  Feeling that you have something in your rectum that is blocking or preventing bowel movements.  Seeing blood on the toilet paper or in your stool.  Worsening confusion (in older adults). How is this diagnosed? This condition may be diagnosed based on:  Your symptoms and medical history. You will be asked about your symptoms, lifestyle, diet, and any medicines that you are taking.  A physical exam. ? Your abdomen will be examined. ? A digital rectal exam may be done. For this exam, a health care provider places a lubricated, gloved finger into the rectum.  Tests to check for any underlying causes of your constipation. These may be ordered if you have bleeding in your rectum, weight loss, or a family  history of colon cancer. In these cases, you may have: ? Imaging studies of the colon. These may include X-ray, ultrasound, or a CT scan. ? Blood tests. ? A procedure to examine the inside of your colon (colonoscopy). ? More specialized tests to check:  Whether your anal sphincter works well. This is a ring-shaped muscle that controls the closing of the anus.  How well food moves through your colon. ? Tests to measure the nerve signal in your pelvic floor muscles (electromyography). How is this treated? Treatment for chronic constipation depends on the cause. Most often, treatment starts with:  Being more active and getting regular exercise.  Drinking more fluids.  Adding fiber to your diet. Sources of fiber include fruits, vegetables, whole grains, and fiber supplements.  Using medicines such as stool softeners or medicines that increase contractions in your digestive system (pro-motility agents).  Training your pelvic muscles with biofeedback.  Surgery, if there is obstruction. Treatment may also include:  Stopping or changing some medicines if they cause constipation.  Using a fiber supplement (bulk laxative) or stool softener.  Using a prescription laxative. This works by [employer] into your colon (osmotic laxative). You may  also need to see a specialist who treats conditions of the digestive system (gastroenterologist).   Follow these instructions at home: Medicines  Take over-the-counter and prescription medicines only as told by your health care provider.  If you are taking a laxative, take it as told by your health care provider. Eating and drinking  Eat a balanced diet that includes enough fiber. Ask your health care provider to recommend a diet that is right for you.  Drink clear fluids, especially water. Avoid drinking alcohol, caffeine, and soda. These can make constipation worse.  Drink enough fluid to keep your urine pale yellow.   General  instructions  Get some physical activity every day. Ask your health care provider what activities are safe for you.  Get colon cancer screenings as told by your health care provider.  Keep all follow-up visits as told by your health care provider. This is important. Contact a health care provider if you have:  Three or fewer bowel movements a week.  Stools that are hard or lumpy.  Blood on the toilet paper or in your stool after you have a bowel movement.  Unexplained weight loss.  Rectum (rectal) pain.  Stool leakage.  Nausea or vomiting. Get help right away if you have:  Rectal bleeding or you pass blood clots.  Severe rectal pain.  Body tissue that pushes out (protrudes) from your anus.  Severe pain or bloating (distension) in your abdomen.  Vomiting that you cannot control. Summary  Chronic constipation is a condition in which a person has three or fewer bowel movements a week, for 3 months or longer.  You may have a higher risk for this condition if you are an older adult, you are female, or you have a long-term disease.  Treatment for this condition depends on the cause. Most treatments for chronic constipation include adding fiber to your diet, drinking more fluids, and getting more physical activity. You may also need to treat any underlying medical conditions or stop or change certain medicines if they cause constipation.  If lifestyle changes do not relieve constipation, your health care provider may recommend taking a laxative. This information is not intended to replace advice given to you by your health care provider. Make sure you discuss any questions you have with your health care provider. Document Revised: 08/15/2019 Document Reviewed: 08/15/2019 Elsevier Patient Education  [place].

## 2021-01-27 ENCOUNTER — Ambulatory Visit: Payer: Medicare Other | Attending: Family Medicine

## 2021-01-27 ENCOUNTER — Other Ambulatory Visit: Payer: Self-pay

## 2021-01-27 DIAGNOSIS — M25561 Pain in right knee: Secondary | ICD-10-CM | POA: Insufficient documentation

## 2021-01-27 DIAGNOSIS — M545 Low back pain, unspecified: Secondary | ICD-10-CM | POA: Diagnosis not present

## 2021-01-27 DIAGNOSIS — M6281 Muscle weakness (generalized): Secondary | ICD-10-CM | POA: Insufficient documentation

## 2021-01-27 DIAGNOSIS — M25552 Pain in left hip: Secondary | ICD-10-CM | POA: Insufficient documentation

## 2021-01-27 DIAGNOSIS — R262 Difficulty in walking, not elsewhere classified: Secondary | ICD-10-CM | POA: Diagnosis not present

## 2021-01-27 DIAGNOSIS — G8929 Other chronic pain: Secondary | ICD-10-CM | POA: Insufficient documentation

## 2021-01-27 DIAGNOSIS — M25551 Pain in right hip: Secondary | ICD-10-CM | POA: Diagnosis not present

## 2021-01-27 NOTE — Therapy (Signed)
[place], [place], [address] Phone: [phone]   Fax:  [phone]  Physical Therapy Evaluation  Patient Details  Name: Erica [name] MRN: [id number] Date of Birth: [birth year] Referring Provider (PT): Lyndal Pulley, DO   Encounter Date: 01/27/2021   PT End of Session - 01/27/21 1102    Visit Number 1    Number of Visits 17    Date for PT Re-Evaluation 03/28/21    Authorization Type [employer] MCR    Progress Note Due on Visit 10    PT Start Time 1102    PT Stop Time 1145    PT Time Calculation (min) 43 min    Activity Tolerance Patient tolerated treatment well    Behavior During Therapy [facility] for tasks assessed/performed           Past Medical History:  Diagnosis Date  . Abnormal stress test    a. 09/2016: NST showed a small defect of mild severity present in the basal inferoseptal and mid inferoseptal location, consistent with ischemia. --> medically managed  . ALLERGIC RHINITIS   . Anxiety   . Bursitis   . DDD (degenerative disc disease), lumbar    ESI with [facility] (spring 2016)  . Depression   . Diabetes mellitus without complication ([place])   . Dyslipidemia   . External hemorrhoids   . GERD (gastroesophageal reflux disease)   . Hypertension   . Obesity   . Osteoarthritis   . Palpitations    a. prior event monitor showing sinus tachycardia, no PAF.   Marland Kitchen Panic attacks    Hx of depression  . Sleep apnea    CPAP hs    Past Surgical History:  Procedure Laterality Date  . ABDOMINAL HYSTERECTOMY    . MASS EXCISION Left 12/30/2015   Procedure: EXCISION LEFT LEG MASS;  Surgeon: Donnie Mesa, MD;  Location: [place];  Service: General;  Laterality: Left;  . REPLACEMENT TOTAL KNEE Right   . RIGHT OOPHORECTOMY     No cancer    There were no vitals filed for this visit.    Subjective Assessment - 01/27/21 1106    Subjective "I never had no dealing with bursitis like that,  but I know that sciatic nerve and my two bulging disc are bothering me. I had a total knee a long time ago on the this knee (Rt) and it just hurts and goes up to the hip." Patient reports onset of pain over the summer when she was taking care of her sister in low back, hips, and knees. She reports history of chronic low back pain prior to this past summer, but denies history of bursitis. Patient reports today the pain is along the Rt hip to the knee described as grinding. She reports pain typically runs from her low back to the side of both legs to her ankles described as shooting. She recently received bilateral hip injections, which helped for a little while. She has received an epidural injection in the past, which has helped with her back pain. Patient reports occasional numbness in the Rt knee, which has been present since her TKA. Patient denies any changes in bowel/bladder.    Pertinent History See PMH above    Limitations Walking;Standing    How long can you sit comfortably? "I have to be in recliner and it doesn't bother me"    How long can you stand comfortably? "I can't stand long, maybe 15 minutes"  How long can you walk comfortably? "I try to walk around the circle, I might can walk 10 minutes."    Patient Stated Goals Feeling better    Currently in Pain? Yes    Pain Score 7     Pain Location Knee    Pain Orientation Right    Pain Descriptors / Indicators Throbbing    Pain Type Chronic pain    Pain Radiating Towards see subjective above    Pain Onset More than a month ago    Pain Frequency Intermittent    Aggravating Factors  bending, walking, standing    Pain Relieving Factors medication    Effect of Pain on Daily Activities difficulty with walking, standing              [facility] PT Assessment - 01/27/21 0001      Assessment   Medical Diagnosis Chronic pain of left knee  M54.50,G89.29 (ICD-10-CM) - Chronic low back pain, unspecified back pain laterality, unspecified whether  sciatica present  M70.61,M70.62 (ICD-10-CM) - Greater trochanteric bursitis of both hips    Referring Provider (PT) Lyndal Pulley, DO    Onset Date/Surgical Date --   for years, recent flare up summer 2021   Hand Dominance Right    Next MD Visit 02/02/2021    Prior Therapy yes, years ago      Precautions   Precautions None      Restrictions   Weight Bearing Restrictions No      Balance Screen   Has the patient fallen in the past 6 months No      Home Environment   Living Environment Private residence    Living Arrangements Spouse/significant other    Type of Home House    Additional Comments can't find her cane      Prior Function   Level of Independence Independent    Leisure has difficulty with tub/shower transfers      Cognition   Overall Cognitive Status Within Functional Limits for tasks assessed      Observation/Other Assessments   Focus on Therapeutic Outcomes (FOTO)  Hip 49% function 53% predicted; lumbar 42% function to 49% predicted      Sensation   Light Touch Not tested      Coordination   Gross Motor Movements are Fluid and Coordinated Yes      Posture/Postural Control   Posture/Postural Control Postural limitations    Postural Limitations Forward head;Rounded Shoulders      AROM   Right Hip Flexion 90   pain Rt knee   Left Hip Flexion 100    Right Knee Flexion 105   pain Rt knee   Left Knee Flexion 115    Lumbar Flexion WNL    Lumbar Extension WNL    Lumbar - Right Side Bend WNL    Lumbar - Left Side Bend WNL    Lumbar - Right Rotation 50% limitation bilaterally    Lumbar - Left Rotation 50% limitation bilaterally      Strength   Right Hip Flexion 4-/5    Right Hip Extension 3+/5    Right Hip ABduction 3+/5    Left Hip Flexion 4-/5    Left Hip Extension 3+/5    Left Hip ABduction 3+/5    Right Knee Flexion 5/5    Right Knee Extension 5/5    Left Knee Flexion 5/5    Left Knee Extension 5/5    Right/Left Ankle --   5/5 bilaterally  Flexibility   Soft Tissue Assessment /Muscle Length yes    Hamstrings WNL bilaterally    Quadriceps PKB 90 degrees bilaterally pain in Rt knee    Piriformis WNL bilaterally      Palpation   Palpation comment TTP bilateral greater trochanter, diffuse tenderness about lumbar region      Special Tests   Other special tests (-) SLR bilaterally      Transfers   Transfers Sit to Stand    Sit to Stand --   significant use of UE to transfer     Ambulation/Gait   Gait Comments lateral trunk lean, wide BOS      Balance   Balance Assessed Yes      Static Standing Balance   Static Standing - Comment/# of Minutes 2 sec LLE, unable RLE                      Objective measurements completed on examination: See above findings.       [place] Adult PT Treatment/Exercise - 01/27/21 0001      Self-Care   Self-Care Other Self-Care Comments    Other Self-Care Comments  see patient education                  PT Education - 01/27/21 1124    Education Details Education on current condition, POC,    Person(s) Educated Patient    Methods Explanation    Comprehension Verbalized understanding            PT Short Term Goals - 01/27/21 1124      PT SHORT TERM GOAL #1   Title Patient will be independent with initial HEP.    Baseline no time to issue at eval.    Time 3    Period Weeks    Status New    Target Date 02/17/21      PT SHORT TERM GOAL #2   Title Therapist will review FOTO and anticipated progress.    Baseline FOTO score captured at eval.    Time 2    Period Weeks    Status New    Target Date 02/10/21      PT SHORT TERM GOAL #3   Title Patient will demonstrate at least 100 degrees of Rt hip flexion AROM and 115 degrees of Rt knee flexion AROM to improve ability to complete shower/tub transfer.    Baseline 90 degrees and 105 degrees    Time 4    Period Weeks    Status New    Target Date 02/24/21      PT SHORT TERM GOAL #4   Title Patient will  complete sit to stand transfer without use of UE to improve ease of transferring.    Baseline significant use of UE    Time 4    Period Weeks    Status New    Target Date 02/24/21      PT SHORT TERM GOAL #5   Title --    Baseline --    Time --    Period --    Status --    Target Date --             PT Long Term Goals - 01/27/21 1311      PT LONG TERM GOAL #1   Title Patient will demonstrate appropriate bending and lifting mechanics to reduce stress on low back with household activity.    Baseline pain and difficulty with bending activity  Time 8    Period Weeks    Status New    Target Date 03/24/21      PT LONG TERM GOAL #2   Title Patient will demonstrate at least 4/5 strength in bilateral hips to improve stability about the chain with prolonged standing/walking.    Baseline see flowsheet    Time 8    Period Weeks    Status New    Target Date 03/24/21      PT LONG TERM GOAL #3   Title Patient will maintain SLS for at least 3 seconds with no lateral trunk lean to improve stability and mechanics with walking.    Baseline unable RLE, 2 seconds LLE with lateral trunk lean    Time 8    Period Weeks    Status New    Target Date 03/24/21      PT LONG TERM GOAL #4   Title Patient will be independent with advanced HEP to progress/maintain current level of function and manage her chronic condition    Time 8    Period Weeks    Status New    Target Date 03/24/21                  Plan - 01/27/21 1128    Clinical Impression Statement Patient is a 65 y/o female who presents to [facility] with chief complaint of chronic low back pain, bilateral lateral hip pain, and Rt knee pain. She reports having a history of low back pain for years, but worsened over the past summer due to helping take care of her sister. She reports the bilateral lateral hip pain began while caring for her sister last summer and recently received injections that did initially help with this pain. She  reports chronic Rt knee pain since having a TKA, but is unsure of when this surgery was performed. Her pain today was located along Rt lateral hip to the anterior Rt knee. Unable to elicit back pain during today's examination, thought patient reports this pain occurs intermittently from her low back and can radiate to lateral aspect of BLEs that is worsened with bending and prolonged standing/walking. Overall good trunk ROM with exception of rotation. She is noted to have significant bilateral hip weakness and limited Rt knee/hip flexion AROM. She demonstrates a lateral trunk lean when ambulating and has poor single leg balance. She states that she can't find her cane, though would likely benefit from using her AD. She should benefit from skilled PT to address above stated deficits in order to return to optimal.    Personal Factors and Comorbidities Age;Time since onset of injury/illness/exacerbation;Fitness    Examination-Activity Limitations Stand;Transfers;Locomotion Level    Stability/Clinical Decision Making Evolving/Moderate complexity    Clinical Decision Making Moderate    Rehab Potential Fair    PT Frequency 2x / week    PT Duration 8 weeks    PT Treatment/Interventions ADLs/Self Care Home Management;Aquatic Therapy;Cryotherapy;Moist Heat;Gait training;Stair training;Therapeutic activities;Therapeutic exercise;Balance training;Neuromuscular re-education;Patient/family education;Manual techniques;Passive range of motion;Dry needling;Taping    PT Next Visit Plan assess sensation, issue HEP (no time at eval), review FOTO    PT Home Exercise Plan no time at eval.    Consulted and Agree with Plan of Care Patient           Patient will benefit from skilled therapeutic intervention in order to improve the following deficits and impairments:  Difficulty walking,Abnormal gait,Decreased range of motion,Decreased activity tolerance,Pain,Improper body mechanics,Decreased balance,Decreased  strength,Postural dysfunction  Visit  Diagnosis: Chronic bilateral low back pain, unspecified whether sciatica present  Hip pain, bilateral  Chronic pain of right knee  Muscle weakness (generalized)  Difficulty in walking, not elsewhere classified     Problem List Patient Active Problem List   Diagnosis Date Noted  . Morbid obesity ([place]) 12/08/2020  . Stenosis of carotid artery 09/18/2020  . Greater trochanteric bursitis of both hips 08/25/2020  . Greater trochanteric bursitis of right hip 05/05/2020  . 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 Chronic constipation 08/23/2019  . Abdominal pain 08/23/2019  . Bilateral carotid artery stenosis 06/04/2019  . Osteopenia 05/19/2019  . Left knee pain 12/11/2018  . Class 2 severe obesity with serious comorbidity and body mass index (BMI) of 37.0 to 37.9 in adult ([place]) 08/31/2018  . Degeneration of lumbar intervertebral disc 05/30/2018  . Onychomycosis 12/07/2017  . Left hip pain 06/12/2017  . Acute left-sided low back pain without sciatica 06/12/2017  . Meningioma ([place]) 03/28/2017  . Panic attacks 09/27/2016  . GERD (gastroesophageal reflux disease) 09/27/2016  . Right ankle pain 08/19/2016  . Diabetes ([place]) 02/15/2016  . Lump of skin 11/28/2015  . Migraine without aura and with status migrainosus, not intractable 10/03/2015  . Numbness of left hand 10/03/2015  . Cephalalgia 10/03/2015  . Palpitations 09/08/2012  . Sciatica of left side   . Peripheral edema 05/09/2012  . FATIGUE 02/23/2010  . Obstructive sleep apnea 10/31/2009  . External hemorrhoids 10/17/2009  . Hyperlipidemia 01/12/2008  . Depression, major, recurrent ([place]) 01/12/2008  . Essential hypertension, benign 01/12/2008  . Allergic rhinitis 01/12/2008   Gwendolyn Grant, PT, DPT, ATC 01/27/21 2:34 PM  [facility] [address] [place], [place], [address] Phone: [phone]    Fax:  [phone]  Name: Erica [name] MRN: [id number] Date of Birth: 09/21/1956

## 2021-01-28 ENCOUNTER — Other Ambulatory Visit: Payer: Self-pay

## 2021-01-28 DIAGNOSIS — J301 Allergic rhinitis due to pollen: Secondary | ICD-10-CM

## 2021-01-28 DIAGNOSIS — J069 Acute upper respiratory infection, unspecified: Secondary | ICD-10-CM

## 2021-01-28 MED ORDER — LEVOCETIRIZINE DIHYDROCHLORIDE 5 MG PO TABS: 5.0000 mg | ORAL_TABLET | Freq: Every evening | ORAL | 0 refills | Status: DC

## 2021-01-28 MED ORDER — ALBUTEROL SULFATE HFA 108 (90 BASE) MCG/ACT IN AERS: 2.0000 | INHALATION_SPRAY | Freq: Four times a day (QID) | RESPIRATORY_TRACT | 3 refills | Status: DC | PRN

## 2021-01-30 NOTE — Progress Notes (Signed)
[facility] Phone: [phone] Subjective:   Erica [name], am serving as a scribe for Dr. [name]. This visit occurred during the SARS-CoV-2 public health emergency.  Safety protocols were in place, including screening questions prior to the visit, additional usage of staff PPE, and extensive cleaning of exam room while observing appropriate contact time as indicated for disinfecting solutions.   I'm seeing this patient by the request  of:  [name], MD  CC: Low back pain follow-up, hip pain follow-up  HWE:[id number]   12/24/2020 Patient likely has arthritic changes.  Does have a contralateral placement.  Discussed which activities to do which wants to avoid.  Increase activity slowly.  Patient will consider the possibility of a custom brace on this knee when she has one on the contralateral side but is not wearing it.  Will consider injections at follow-up.  Per staff will rule out though the potential for a blood clot  Patient has had difficulty with sciatica but this time seems to be more in the calf itself.  Worsening pain on this and does have some mild swelling noted.  [name] significant erythema or warmness but patient is severely tender.  Will get Doppler to rule out clot.  Patient is moderate to high risk.  Patient otherwise will continue to monitor for other symptoms.  Does have the baseline of peripheral edema of the lower extremities.  Encouraged once again weight loss.  Patient will start with formal physical therapy for the greater trochanteric bursitis as well as the back.  Follow-up with me again 4 to 8 weeks  Patient given injection again today.  This seems to be worsening.  Could be more of a lumbar radiculopathy and patient has responded well to the epidurals.  We did discuss with patient that after 1 week of worsening pain and incontinence I would encourage her to consider another epidural.  Patient  is going to go to formal physical therapy and referred today as well.  Discussed icing regimen and home exercises.  Encourage weight loss.  Patient is going to start with a healthy weight and wellness.  Follow-up with me again 4 to 8 weeks  Update 02/02/2021 Erica [name] is a 65 y.o. female coming in with complaint of B hip and R knee pain. Patient states that her R knee and R hip continue to bother her. Hip injections in both hips last visit which are wearing off.  Patient does state that she is having some mild increase in back pain as well.  Has had an epidural that did help her with some of the leg pain previously in November.  Pain in R knee occurring daily. Was woken up last night due to pain.  We have ruled out such things as blood clots previously.  Patient is also had a bone scan that did not show any loosening of the knee replacement.     Past Medical History:  Diagnosis Date  . Abnormal stress test    a. 09/2016: NST showed a small defect of mild severity present in the basal inferoseptal and mid inferoseptal location, consistent with ischemia. --> medically managed  . ALLERGIC RHINITIS   . Anxiety   . Bursitis   . DDD (degenerative disc disease), lumbar    ESI with Ramos (spring 2016)  . Depression   . Diabetes mellitus without complication ([place])   . Dyslipidemia   . External hemorrhoids   .  GERD (gastroesophageal reflux disease)   . Hypertension   . Obesity   . Osteoarthritis   . Palpitations    a. prior event monitor showing sinus tachycardia, [name] PAF.   Marland Kitchen Panic attacks    Hx of depression  . Sleep apnea    CPAP hs   Past Surgical History:  Procedure Laterality Date  . ABDOMINAL HYSTERECTOMY    . MASS EXCISION Left 12/30/2015   Procedure: EXCISION LEFT LEG MASS;  Surgeon: Donnie Mesa, MD;  Location: [facility];  Service: General;  Laterality: Left;  . REPLACEMENT TOTAL KNEE Right   . RIGHT OOPHORECTOMY     [name] cancer   Social History    Socioeconomic History  . Marital status: Married    Spouse name: [name]  . Number of children: 3  . Years of education: Not on file  . Highest education level: Not on file  Occupational History  . Occupation: Disabled  Tobacco Use  . Smoking status: Never Smoker  . Smokeless tobacco: Never Used  . Tobacco comment: tried to as a teenager  Vaping Use  . Vaping Use: Never used  Substance and Sexual Activity  . Alcohol use: Yes    Alcohol/week: 1.0 standard drink    Types: 1 Glasses of wine per week    Comment: one drink a month  . Drug use: [name]  . Sexual activity: Not Currently  Other Topics Concern  . Not on file  Social History Narrative   Married with children. Pt is on disablity. Previous worked in the school system   Social Determinants of Radio broadcast assistant Strain: [place]   . Difficulty of Paying Living Expenses: Not hard at all  Food Insecurity: Not on file  Transportation Needs: Not on file  Physical Activity: Not on file  Stress: Not on file  Social Connections: Not on file   Allergies  Allergen Reactions  . Seroquel [Quetiapine Fumarate]     Hallucinations, palpitations  . Codeine Nausea And Vomiting  . Guaifenesin-Codeine Other (See Comments)    REACTION: feels spacey  . Wellbutrin [Bupropion] Palpitations    hallucinations   Family History  Problem Relation Age of Onset  . Heart disease Mother        Enlarged Heart, Pacemaker  . Diabetes Mother   . Thyroid disease Mother   . Hyperlipidemia Mother   . Hypertension Mother   . Sleep apnea Mother   . Dementia Father   . Kidney failure Father   . Prostate cancer Father   . Alcoholism Father   . Asthma Other   . Allergies Sister   . Asthma Son   . Breast cancer Maternal Aunt        cousin  . Colon cancer Cousin   . Heart disease Son        CHF, morbid obesity  . Prostate cancer Maternal Uncle   . Diabetes Son     Current Outpatient Medications (Endocrine & Metabolic):  .   metFORMIN (GLUCOPHAGE) 500 MG tablet, Take 1 tablet (500 mg total) by mouth daily with breakfast. 2 week supply until she receive mail order .  Semaglutide,0.25 or 0.5MG/DOS, (OZEMPIC, 0.25 OR 0.5 MG/DOSE,) 2 MG/1.5ML SOPN, Inject 0.25 mg into the skin once a week for 30 days, THEN 0.5 mg once a week.  Current Outpatient Medications (Cardiovascular):  .  atenolol (TENORMIN) 25 MG tablet, Take 18m (1 tablet) in the AM and 557m(2 tablets) in the PM .  furosemide (LASIX) 40 MG tablet, Take 1 tablet (40 mg total) by mouth 2 (two) times daily. .  propranolol (INDERAL) 10 MG tablet, Take 1 tablet (10 mg total) by mouth 3 (three) times daily as needed (PALPITATIONS). .  rosuvastatin (CRESTOR) 20 MG tablet, Take 1 tablet (20 mg total) by mouth daily.  Current Outpatient Medications (Respiratory):  .  albuterol (VENTOLIN HFA) 108 (90 Base) MCG/ACT inhaler, Inhale 2 puffs into the lungs every 6 (six) hours as needed for wheezing or shortness of breath. .  fluticasone (FLONASE) 50 MCG/ACT nasal spray, Place 2 sprays into both nostrils daily. Marland Kitchen  levocetirizine (XYZAL) 5 MG tablet, Take 1 tablet (5 mg total) by mouth every evening.  Current Outpatient Medications (Analgesics):  Marland Kitchen  SUMAtriptan-naproxen (TREXIMET) 85-500 MG tablet, Take 1 pill as needed for migraine, if migraine persists may repeat x1 after 2 hours   Current Outpatient Medications (Other):  Marland Kitchen  ALPRAZolam (XANAX) 1 MG tablet, Take 1 mg by mouth as needed for anxiety. .  AMBULATORY NON FORMULARY MEDICATION, 4 Prong cane .  blood glucose meter kit and supplies KIT, Dispense based on patient and insurance preference. Use up to four times daily as directed. (FOR E11.9). .  hydrOXYzine (ATARAX/VISTARIL) 10 MG tablet, Take 1 tablet (10 mg total) by mouth 3 (three) times daily as needed for itching. .  linaclotide (LINZESS) 290 MCG CAPS capsule, Take 1 capsule (290 mcg total) by mouth daily before breakfast. 2 week supply until she receive mail  order .  Multiple Vitamins-Minerals (MULTIVITAMIN WITH MINERALS) tablet, Take 1 tablet by mouth daily. Marland Kitchen  omeprazole (PRILOSEC) 20 MG capsule, Take 1 capsule (20 mg total) by mouth daily. 2 week supply until she receive mail order .  ondansetron (ZOFRAN) 4 MG tablet, Take 1 tablet (4 mg total) by mouth every 8 (eight) hours as needed for nausea or vomiting. .  potassium chloride SA (KLOR-CON) 20 MEQ tablet, Take 1 tablet (20 mEq total) by mouth 2 (two) times daily. 2 week supply until she receive mail order (Patient taking differently: Take 20 mEq by mouth daily. 1 tablet by mouth daily) .  tiZANidine (ZANAFLEX) 4 MG tablet, TAKE 1 TABLET BY MOUTH AT BEDTIME .  venlafaxine XR (EFFEXOR-XR) 150 MG 24 hr capsule, Take 1 capsule (150 mg total) by mouth 2 (two) times daily. .  Vitamin D, Ergocalciferol, (DRISDOL) 1.25 MG (50000 UNIT) CAPS capsule, Take 1 capsule (50,000 Units total) by mouth every 7 (seven) days.   Reviewed prior external information including notes and imaging from  primary care provider As well as notes that were available from care everywhere and other healthcare systems.  Past medical history, social, surgical and family history all reviewed in electronic medical record.  [name] pertanent information unless stated regarding to the chief complaint.   Review of Systems:  [name] headache, visual changes, nausea, vomiting, diarrhea, constipation, dizziness, abdominal pain, skin rash, fevers, chills, night sweats, weight loss, swollen lymph nodes, body aches, joint swelling, chest pain, shortness of breath, mood changes. POSITIVE muscle aches  Objective  Blood pressure 124/72, pulse 75, height 5' 6" (1.676 m), weight 248 lb (112.5 kg), SpO2 97 %.   General: [name] apparent distress alert and oriented x3 mood and affect normal, dressed appropriately.  HEENT: Pupils equal, extraocular movements intact  Respiratory: Patient's speak in full sentences and does not appear short of breath   Cardiovascular: lower extremity edema noted Gait antalgic gait noted.  The patient does have some mild wide-based gait.  Patient is diffusely tender down the right leg.  Mild positive straight leg test.  The right knee does have what appears to be a trace effusion.  [name] lower extremity effusion bilaterally.  Patient is tender to palpation in the soft tissue on the lateral aspect of the hip as well.  Mild pain with internal and external range of motion of the hip but all pain is lateral.  [name] tenderness to palpation in the paraspinal musculature of the lumbar spine.  After verbal consent patient was prepped with alcohol swab and with a 25-gauge 1 inch needle injected into the right gluteal area with a total of 30 mg of Toradol.  [name] blood loss.  Band-Aid placed.  Postinjection instructions given.  After verbal consent patient was prepped with alcohol swab and with a 25-gauge 1 inch needle injected into the left gluteal area for the total of 40 mg of Depo-Medrol.  [name] blood loss.  Postinjection instructions given.    Impression and Recommendations:     The above documentation has been reviewed and is accurate and complete Lyndal Pulley, DO

## 2021-02-02 ENCOUNTER — Encounter: Payer: Self-pay | Admitting: Family Medicine

## 2021-02-02 ENCOUNTER — Ambulatory Visit (INDEPENDENT_AMBULATORY_CARE_PROVIDER_SITE_OTHER): Payer: Medicare Other | Admitting: Family Medicine

## 2021-02-02 ENCOUNTER — Other Ambulatory Visit: Payer: Self-pay

## 2021-02-02 VITALS — BP 124/72 | HR 75 | Ht 66.0 in | Wt 248.0 lb

## 2021-02-02 DIAGNOSIS — M545 Low back pain, unspecified: Secondary | ICD-10-CM | POA: Diagnosis not present

## 2021-02-02 DIAGNOSIS — G8929 Other chronic pain: Secondary | ICD-10-CM

## 2021-02-02 DIAGNOSIS — M2550 Pain in unspecified joint: Secondary | ICD-10-CM

## 2021-02-02 DIAGNOSIS — M7062 Trochanteric bursitis, left hip: Secondary | ICD-10-CM | POA: Diagnosis not present

## 2021-02-02 DIAGNOSIS — M7061 Trochanteric bursitis, right hip: Secondary | ICD-10-CM

## 2021-02-02 LAB — COMPREHENSIVE METABOLIC PANEL
ALT: 11 U/L (ref 0–35)
AST: 14 U/L (ref 0–37)
Albumin: 3.8 g/dL (ref 3.5–5.2)
Alkaline Phosphatase: 150 U/L — ABNORMAL HIGH (ref 39–117)
BUN: 15 mg/dL (ref 6–23)
CO2: 33 mEq/L — ABNORMAL HIGH (ref 19–32)
Calcium: 10 mg/dL (ref 8.4–10.5)
Chloride: 102 mEq/L (ref 96–112)
Creatinine, Ser: 0.81 mg/dL (ref 0.40–1.20)
GFR: 75.24 mL/min (ref >60.00)
Glucose, Bld: 115 mg/dL — ABNORMAL HIGH (ref 70–99)
Potassium: 3.5 mEq/L (ref 3.5–5.1)
Sodium: 141 mEq/L (ref 135–145)
Total Bilirubin: 0.3 mg/dL (ref 0.2–1.2)
Total Protein: 7.5 g/dL (ref 6.0–8.3)

## 2021-02-02 LAB — CBC WITH DIFFERENTIAL/PLATELET
Basophils Absolute: 0 10*3/uL (ref 0.0–0.1)
Basophils Relative: 0.3 % (ref 0.0–3.0)
Eosinophils Absolute: 0.1 10*3/uL (ref 0.0–0.7)
Eosinophils Relative: 1 % (ref 0.0–5.0)
HCT: 36.7 % (ref 36.0–46.0)
Hemoglobin: 12.1 g/dL (ref 12.0–15.0)
Lymphocytes Relative: 19.4 % (ref 12.0–46.0)
Lymphs Abs: 1.5 10*3/uL (ref 0.7–4.0)
MCHC: 33 g/dL (ref 30.0–36.0)
MCV: 85.5 fl (ref 78.0–100.0)
Monocytes Absolute: 0.4 10*3/uL (ref 0.1–1.0)
Monocytes Relative: 5.4 % (ref 3.0–12.0)
Neutro Abs: 5.8 10*3/uL (ref 1.4–7.7)
Neutrophils Relative %: 73.9 % (ref 43.0–77.0)
Platelets: 287 10*3/uL (ref 150.0–400.0)
RBC: 4.29 Mil/uL (ref 3.87–5.11)
RDW: 14.1 % (ref 11.5–15.5)
WBC: 7.8 10*3/uL (ref 4.0–10.5)

## 2021-02-02 LAB — C-REACTIVE PROTEIN: CRP: 1.2 mg/dL (ref 0.5–20.0)

## 2021-02-02 LAB — IBC PANEL
Iron: 54 ug/dL (ref 42–145)
Saturation Ratios: 16.4 % — ABNORMAL LOW (ref 20.0–50.0)
Transferrin: 235 mg/dL (ref 212.0–360.0)

## 2021-02-02 LAB — SEDIMENTATION RATE: Sed Rate: 33 mm/hr — ABNORMAL HIGH (ref 0–30)

## 2021-02-02 LAB — TSH: TSH: 2.26 u[IU]/mL (ref 0.35–4.50)

## 2021-02-02 LAB — VITAMIN D 25 HYDROXY (VIT D DEFICIENCY, FRACTURES): VITD: 64.84 ng/mL (ref 30.00–100.00)

## 2021-02-02 LAB — FERRITIN: Ferritin: 45.2 ng/mL (ref 10.0–291.0)

## 2021-02-02 LAB — VITAMIN B12: Vitamin B-12: 181 pg/mL — ABNORMAL LOW (ref 211–911)

## 2021-02-02 LAB — URIC ACID: Uric Acid, Serum: 5.5 mg/dL (ref 2.4–7.0)

## 2021-02-02 MED ORDER — METHYLPREDNISOLONE ACETATE 40 MG/ML IJ SUSP: 40.0000 mg | Freq: Once | INTRAMUSCULAR | Status: DC

## 2021-02-02 MED ORDER — KETOROLAC TROMETHAMINE 60 MG/2ML IM SOLN: 60.0000 mg | Freq: Once | INTRAMUSCULAR | Status: DC

## 2021-02-02 MED ORDER — KETOROLAC TROMETHAMINE 30 MG/ML IJ SOLN
30.0000 mg | Freq: Once | INTRAMUSCULAR | Status: AC
  Administered 2021-02-02: 30 mg via INTRAMUSCULAR

## 2021-02-02 MED ORDER — METHYLPREDNISOLONE ACETATE 40 MG/ML IJ SUSP
40.0000 mg | Freq: Once | INTRAMUSCULAR | Status: AC
  Administered 2021-02-02: 40 mg via INTRAMUSCULAR

## 2021-02-02 MED ORDER — AMBULATORY NON FORMULARY MEDICATION: 0 refills | Status: DC

## 2021-02-02 MED ORDER — KETOROLAC TROMETHAMINE 30 MG/ML IJ SOLN: 30.0000 mg | Freq: Once | INTRAMUSCULAR | Status: DC

## 2021-02-02 NOTE — Assessment & Plan Note (Signed)
Continues on chronic pain.  Could be secondary to lumbar radiculopathy.  Having done bone scan of the knee that did not show any significant loosening.  Discussed with patient about icing regimen.  We have ruled out such things as clots at this point.  Patient knows that if any worsening pain, shortness of breath to seek medical attention.  Follow-up with me again after 6 to 8 weeks with working with physical therapy as well as the laboratory work-up and the epidural.

## 2021-02-02 NOTE — Patient Instructions (Addendum)
Good to see you Repeat epidural Labs today Nerve conduction test 4 prong cane Continue PT See me again in 6-8 weeks

## 2021-02-02 NOTE — Assessment & Plan Note (Signed)
We discussed icing regimen and home exercise, which activities to do which wants to avoid.  Increase activity slowly.  Discussed icing regimen and home exercises.  We will try the epidural again.  Nerve conduction study ordered.  Laboratory work-up also ordered.

## 2021-02-02 NOTE — Assessment & Plan Note (Signed)
Patient did not respond as well to the injections this time especially on the right leg.  Continuing to have difficulty with the right leg.  We will get another epidural which patient has responded to previously of the lumbar spine in hopes that this will help.  In addition to this we will get laboratory work-up to make sure there is nothing else that is contributing to some of this at the moment.  Patient is going to continue to work with formal physical therapy.  Discussed with patient having sometimes bilateral leg pain we will get a nerve conduction test to further evaluate if this is more secondary to peripheral neuropathy versus the possibility of lumbar radiculopathy.  Follow-up with me again in 6 to 8 weeks otherwise.

## 2021-02-03 ENCOUNTER — Encounter: Payer: Self-pay | Admitting: Neurology

## 2021-02-04 LAB — PROTEIN ELECTROPHORESIS, SERUM
Albumin ELP: 3.6 g/dL — ABNORMAL LOW (ref 3.8–4.8)
Alpha 1: 0.4 g/dL — ABNORMAL HIGH (ref 0.2–0.3)
Alpha 2: 0.9 g/dL (ref 0.5–0.9)
Beta 2: 0.5 g/dL (ref 0.2–0.5)
Beta Globulin: 0.5 g/dL (ref 0.4–0.6)
Gamma Globulin: 1.1 g/dL (ref 0.8–1.7)
Total Protein: 7 g/dL (ref 6.1–8.1)

## 2021-02-04 LAB — CYCLIC CITRUL PEPTIDE ANTIBODY, IGG: Cyclic Citrullin Peptide Ab: <16 UNITS

## 2021-02-04 LAB — CALCIUM, IONIZED: Calcium, Ion: 5.3 mg/dL (ref 4.8–5.6)

## 2021-02-04 LAB — PTH, INTACT AND CALCIUM
Calcium: 9.8 mg/dL (ref 8.6–10.4)
PTH: 76 pg/mL (ref 16–77)

## 2021-02-04 LAB — ANGIOTENSIN CONVERTING ENZYME: Angiotensin-Converting Enzyme: 15 U/L (ref 9–67)

## 2021-02-04 LAB — RHEUMATOID FACTOR: Rheumatoid fact SerPl-aCnc: <14 IU/mL (ref <14)

## 2021-02-04 LAB — ANA: Anti Nuclear Antibody (ANA): NEGATIVE

## 2021-02-05 ENCOUNTER — Ambulatory Visit: Payer: Medicare Other | Admitting: Physical Therapy

## 2021-02-05 DIAGNOSIS — E538 Deficiency of other specified B group vitamins: Secondary | ICD-10-CM | POA: Insufficient documentation

## 2021-02-05 NOTE — Progress Notes (Signed)
Subjective:    Patient ID: Erica [name] Alert, female    DOB: August 03, 1956, 65 y.o.   MRN: [id number]   This visit occurred during the SARS-CoV-2 public health emergency.  Safety protocols were in place, including screening questions prior to the visit, additional usage of staff PPE, and extensive cleaning of exam room while observing appropriate contact time as indicated for disinfecting solutions.    HPI She is here for a physical exam.   B12 def - new with recent labs.   She has not started the vitamins. She is not able to log into mychart and was not aware of her lab results.  Reviewed blood work.    She has no concerns.    Medications and allergies reviewed with patient and updated if appropriate.  Patient Active Problem List   Diagnosis Date Noted  . B12 deficiency 02/05/2021  . Morbid obesity ([place]) 12/08/2020  . Stenosis of carotid artery 09/18/2020  . Greater trochanteric bursitis of both hips 08/25/2020  . Greater trochanteric bursitis of right hip 05/05/2020  . 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 Chronic constipation 08/23/2019  . Abdominal pain 08/23/2019  . Bilateral carotid artery stenosis 06/04/2019  . Osteopenia 05/19/2019  . Left knee pain 12/11/2018  . Class 2 severe obesity with serious comorbidity and body mass index (BMI) of 37.0 to 37.9 in adult ([facility]) 08/31/2018  . Degeneration of lumbar intervertebral disc 05/30/2018  . Onychomycosis 12/07/2017  . Left hip pain 06/12/2017  . Acute left-sided low back pain without sciatica 06/12/2017  . Meningioma ([place]) 03/28/2017  . Panic attacks 09/27/2016  . GERD (gastroesophageal reflux disease) 09/27/2016  . Right ankle pain 08/19/2016  . Diabetes ([place]) 02/15/2016  . Lump of skin 11/28/2015  . Migraine without aura and with status migrainosus, not intractable 10/03/2015  . Numbness of left hand 10/03/2015  . Cephalalgia 10/03/2015  . Palpitations 09/08/2012  . Sciatica  of left side   . Peripheral edema 05/09/2012  . FATIGUE 02/23/2010  . Obstructive sleep apnea 10/31/2009  . External hemorrhoids 10/17/2009  . Hyperlipidemia 01/12/2008  . Depression, major, recurrent ([place]) 01/12/2008  . Essential hypertension, benign 01/12/2008  . Allergic rhinitis 01/12/2008    Current Outpatient Medications on File Prior to Visit  Medication Sig Dispense Refill  . albuterol (VENTOLIN HFA) 108 (90 Base) MCG/ACT inhaler Inhale 2 puffs into the lungs every 6 (six) hours as needed for wheezing or shortness of breath. 16 g 3  . ALPRAZolam (XANAX) 1 MG tablet Take 1 mg by mouth as needed for anxiety.    . AMBULATORY NON FORMULARY MEDICATION 4 Prong cane 1 Units 0  . atenolol (TENORMIN) 25 MG tablet Take $RemoveBef'25mg'CcMzChgXZU$  (1 tablet) in the AM and $Remo'50mg'TVvZX$  (2 tablets) in the PM 14 tablet 0  . blood glucose meter kit and supplies KIT Dispense based on patient and insurance preference. Use up to four times daily as directed. (FOR E11.9). 1 each 0  . fluticasone (FLONASE) 50 MCG/ACT nasal spray Place 2 sprays into both nostrils daily. 16 g 0  . hydrOXYzine (ATARAX/VISTARIL) 10 MG tablet Take 1 tablet (10 mg total) by mouth 3 (three) times daily as needed for itching. 15 tablet 0  . metFORMIN (GLUCOPHAGE) 500 MG tablet Take 1 tablet (500 mg total) by mouth daily with breakfast. 2 week supply until she receive mail order 14 tablet 0  . Multiple Vitamins-Minerals (MULTIVITAMIN WITH MINERALS) tablet Take 1 tablet by mouth daily.    Marland Kitchen  ondansetron (ZOFRAN) 4 MG tablet Take 1 tablet (4 mg total) by mouth every 8 (eight) hours as needed for nausea or vomiting. 40 tablet 0  . potassium chloride SA (KLOR-CON) 20 MEQ tablet Take 1 tablet (20 mEq total) by mouth 2 (two) times daily. 2 week supply until she receive mail order (Patient taking differently: Take 20 mEq by mouth daily. 1 tablet by mouth daily) 14 tablet 0  . propranolol (INDERAL) 10 MG tablet Take 1 tablet (10 mg total) by mouth 3 (three) times daily  as needed (PALPITATIONS). 60 tablet 3  . rosuvastatin (CRESTOR) 20 MG tablet Take 1 tablet (20 mg total) by mouth daily. 90 tablet 3  . Semaglutide,0.25 or 0.5MG/DOS, (OZEMPIC, 0.25 OR 0.5 MG/DOSE,) 2 MG/1.5ML SOPN Inject 0.25 mg into the skin once a week for 30 days, THEN 0.5 mg once a week. 3 mL 3  . SUMAtriptan-naproxen (TREXIMET) 85-500 MG tablet Take 1 pill as needed for migraine, if migraine persists may repeat x1 after 2 hours 10 tablet 8  . tiZANidine (ZANAFLEX) 4 MG tablet TAKE 1 TABLET BY MOUTH AT BEDTIME 90 tablet 0  . venlafaxine XR (EFFEXOR-XR) 150 MG 24 hr capsule Take 1 capsule (150 mg total) by mouth 2 (two) times daily. 180 capsule 0  . Vitamin D, Ergocalciferol, (DRISDOL) 1.25 MG (50000 UNIT) CAPS capsule Take 1 capsule (50,000 Units total) by mouth every 7 (seven) days. 12 capsule 0  . [DISCONTINUED] oxycodone (OXY-IR) 5 MG capsule Take 5 mg by mouth every 4 (four) hours as needed. For pain.     No current facility-administered medications on file prior to visit.    Past Medical History:  Diagnosis Date  . Abnormal stress test    a. 09/2016: NST showed a small defect of mild severity present in the basal inferoseptal and mid inferoseptal location, consistent with ischemia. --> medically managed  . ALLERGIC RHINITIS   . Anxiety   . Bursitis   . DDD (degenerative disc disease), lumbar    ESI with Ramos (spring 2016)  . Depression   . Diabetes mellitus without complication ([place])   . Dyslipidemia   . External hemorrhoids   . GERD (gastroesophageal reflux disease)   . Hypertension   . Obesity   . Osteoarthritis   . Palpitations    a. prior event monitor showing sinus tachycardia, no PAF.   Marland Kitchen Panic attacks    Hx of depression  . Sleep apnea    CPAP hs    Past Surgical History:  Procedure Laterality Date  . ABDOMINAL HYSTERECTOMY    . MASS EXCISION Left 12/30/2015   Procedure: EXCISION LEFT LEG MASS;  Surgeon: Donnie Mesa, MD;  Location: [facility];  Service: General;  Laterality: Left;  . REPLACEMENT TOTAL KNEE Right   . RIGHT OOPHORECTOMY     No cancer    Social History   Socioeconomic History  . Marital status: Married    Spouse name: [name]  . Number of children: 3  . Years of education: Not on file  . Highest education level: Not on file  Occupational History  . Occupation: Disabled  Tobacco Use  . Smoking status: Never Smoker  . Smokeless tobacco: Never Used  . Tobacco comment: tried to as a teenager  Vaping Use  . Vaping Use: Never used  Substance and Sexual Activity  . Alcohol use: Yes    Alcohol/week: 1.0 standard drink    Types: 1 Glasses of wine per week  Comment: one drink a month  . Drug use: No  . Sexual activity: Not Currently  Other Topics Concern  . Not on file  Social History Narrative   Married with children. Pt is on disablity. Previous worked in the school system   Social Determinants of Radio broadcast assistant Strain: [place]   . Difficulty of Paying Living Expenses: Not hard at all  Food Insecurity: No Food Insecurity  . Worried About Charity fundraiser in the Last Year: Never true  . Ran Out of Food in the Last Year: Never true  Transportation Needs: No Transportation Needs  . Lack of Transportation (Medical): No  . Lack of Transportation (Non-Medical): No  Physical Activity: Inactive  . Days of Exercise per Week: 0 days  . Minutes of Exercise per Session: 0 min  Stress: No Stress Concern Present  . Feeling of Stress : Not at all  Social Connections: Socially Integrated  . Frequency of Communication with Friends and Family: More than three times a week  . Frequency of Social Gatherings with Friends and Family: Once a week  . Attends Religious Services: More than 4 times per year  . Active Member of Clubs or Organizations: No  . Attends Archivist Meetings: More than 4 times per year  . Marital Status: Married    Family History  Problem Relation Age of  Onset  . Heart disease Mother        Enlarged Heart, Pacemaker  . Diabetes Mother   . Thyroid disease Mother   . Hyperlipidemia Mother   . Hypertension Mother   . Sleep apnea Mother   . Dementia Father   . Kidney failure Father   . Prostate cancer Father   . Alcoholism Father   . Asthma Other   . Allergies Sister   . Asthma Son   . Breast cancer Maternal Aunt        cousin  . Colon cancer Cousin   . Heart disease Son        CHF, morbid obesity  . Prostate cancer Maternal Uncle   . Diabetes Son     Review of Systems  Constitutional: Positive for fatigue. Negative for chills and fever.  HENT: Positive for congestion, ear pain (left) and postnasal drip. Negative for sore throat.   Eyes: Negative for visual disturbance.  Respiratory: Positive for wheezing (a little -pollen). Negative for cough and shortness of breath.   Cardiovascular: Negative for chest pain, palpitations and leg swelling.  Gastrointestinal: Positive for constipation. Negative for abdominal pain, blood in stool, diarrhea and nausea.       Gerd controlled  Genitourinary: Negative for dysuria and hematuria.  Musculoskeletal: Positive for arthralgias (hips) and back pain.  Skin: Negative for rash.  Neurological: Positive for numbness (R knee - intermittent) and headaches (occ sinus headache). Negative for dizziness and light-headedness.  Psychiatric/Behavioral: Positive for dysphoric mood. The patient is nervous/anxious.        Objective:   Vitals:   02/06/21 1026  BP: 120/80  Pulse: 70  Temp: 98.1 F (36.7 C)  SpO2: 98%   Filed Weights   02/06/21 1026  Weight: 250 lb (113.4 kg)   Body mass index is 40.35 kg/m.  BP Readings from Last 3 Encounters:  02/06/21 120/80  02/06/21 120/80  02/02/21 124/72    Wt Readings from Last 3 Encounters:  02/06/21 250 lb (113.4 kg)  02/06/21 250 lb (113.4 kg)  02/02/21 248 lb (112.5 kg)  Physical Exam Constitutional: She appears well-developed and  well-nourished. No distress.  HENT:  Head: Normocephalic and atraumatic.  Right Ear: External ear normal. Normal ear canal and TM Left Ear: External ear normal.  Normal ear canal and TM Mouth/Throat: Oropharynx is clear and moist.  Eyes: Conjunctivae and EOM are normal.  Neck: Neck supple. No tracheal deviation present. No thyromegaly present.  No carotid bruit  Cardiovascular: Normal rate, regular rhythm and normal heart sounds.   No murmur heard.  No edema. Pulmonary/Chest: Effort normal and breath sounds normal. No respiratory distress. She has no wheezes. She has no rales.  Breast: deferred   Abdominal: Soft. She exhibits no distension. There is no tenderness.  Lymphadenopathy: She has no cervical adenopathy.  Skin: Skin is warm and dry. She is not diaphoretic.  Psychiatric: She has a normal mood and affect. Her behavior is normal.        Assessment & Plan:   Physical exam: Screening blood work    Reviewed recent labs Immunizations  Up to date, discussed covid booster Colonoscopy  Up to date  Mammogram  Up to date  Gyn  Up to date Dexa  Up to date  Eye exams  Scheduled for next month Exercise  Some Weight  Encouraged weight loss Substance abuse  none      See Problem List for Assessment and Plan of chronic medical problems.

## 2021-02-05 NOTE — Patient Instructions (Addendum)
You received a B12 injection today   Medications changes include :   Start 1052mcg of B12 with 100mg  of B6 daily  Your prescription(s) have been submitted to your pharmacy. Please take as directed and contact our office if you believe you are having problem(s) with the medication(s).    Please followup in 6 months    Health Maintenance, Female Adopting a healthy lifestyle and getting preventive care are important in promoting health and wellness. Ask your health care provider about:  The right schedule for you to have regular tests and exams.  Things you can do on your own to prevent diseases and keep yourself healthy. What should I know about diet, weight, and exercise? Eat a healthy diet  Eat a diet that includes plenty of vegetables, fruits, low-fat dairy products, and lean protein.  Do not eat a lot of foods that are high in solid fats, added sugars, or sodium.   Maintain a healthy weight Body mass index (BMI) is used to identify weight problems. It estimates body fat based on height and weight. Your health care provider can help determine your BMI and help you achieve or maintain a healthy weight. Get regular exercise Get regular exercise. This is one of the most important things you can do for your health. Most adults should:  Exercise for at least 150 minutes each week. The exercise should increase your heart rate and make you sweat (moderate-intensity exercise).  Do strengthening exercises at least twice a week. This is in addition to the moderate-intensity exercise.  Spend less time sitting. Even light physical activity can be beneficial. Watch cholesterol and blood lipids Have your blood tested for lipids and cholesterol at 67 years of age, then have this test every 5 years. Have your cholesterol levels checked more often if:  Your lipid or cholesterol levels are high.  You are older than 67 years of age.  You are at high risk for heart disease. What should I  know about cancer screening? Depending on your health history and family history, you may need to have cancer screening at various ages. This may include screening for:  Breast cancer.  Cervical cancer.  Colorectal cancer.  Skin cancer.  Lung cancer. What should I know about heart disease, diabetes, and high blood pressure? Blood pressure and heart disease  High blood pressure causes heart disease and increases the risk of stroke. This is more likely to develop in people who have high blood pressure readings, are of African descent, or are overweight.  Have your blood pressure checked: ? Every 3-5 years if you are 82-64 years of age. ? Every year if you are 53 years old or older. Diabetes Have regular diabetes screenings. This checks your fasting blood sugar level. Have the screening done:  Once every three years after age 10 if you are at a normal weight and have a low risk for diabetes.  More often and at a younger age if you are overweight or have a high risk for diabetes. What should I know about preventing infection? Hepatitis B If you have a higher risk for hepatitis B, you should be screened for this virus. Talk with your health care provider to find out if you are at risk for hepatitis B infection. Hepatitis C Testing is recommended for:  Everyone born from [birth year] through 1967.  Anyone with known risk factors for hepatitis C. Sexually transmitted infections (STIs)  Get screened for STIs, including gonorrhea and chlamydia, if: ? You are  sexually active and are younger than 67 years of age. ? You are older than 67 years of age and your health care provider tells you that you are at risk for this type of infection. ? Your sexual activity has changed since you were last screened, and you are at increased risk for chlamydia or gonorrhea. Ask your health care provider if you are at risk.  Ask your health care provider about whether you are at high risk for HIV. Your health  care provider may recommend a prescription medicine to help prevent HIV infection. If you choose to take medicine to prevent HIV, you should first get tested for HIV. You should then be tested every 3 months for as long as you are taking the medicine. Pregnancy  If you are about to stop having your period (premenopausal) and you may become pregnant, seek counseling before you get pregnant.  Take 400 to 800 micrograms (mcg) of folic acid every day if you become pregnant.  Ask for birth control (contraception) if you want to prevent pregnancy. Osteoporosis and menopause Osteoporosis is a disease in which the bones lose minerals and strength with aging. This can result in bone fractures. If you are 45 years old or older, or if you are at risk for osteoporosis and fractures, ask your health care provider if you should:  Be screened for bone loss.  Take a calcium or vitamin D supplement to lower your risk of fractures.  Be given hormone replacement therapy (HRT) to treat symptoms of menopause. Follow these instructions at home: Lifestyle  Do not use any products that contain nicotine or tobacco, such as cigarettes, e-cigarettes, and chewing tobacco. If you need help quitting, ask your health care provider.  Do not use street drugs.  Do not share needles.  Ask your health care provider for help if you need support or information about quitting drugs. Alcohol use  Do not drink alcohol if: ? Your health care provider tells you not to drink. ? You are pregnant, may be pregnant, or are planning to become pregnant.  If you drink alcohol: ? Limit how much you use to 0-1 drink a day. ? Limit intake if you are breastfeeding.  Be aware of how much alcohol is in your drink. In the [place] one drink equals one 12 oz bottle of beer (355 mL), one 5 oz glass of wine (148 mL), or one 1 oz glass of hard liquor (44 mL). General instructions  Schedule regular health, dental, and eye exams.  Stay  current with your vaccines.  Tell your health care provider if: ? You often feel depressed. ? You have ever been abused or do not feel safe at home. Summary  Adopting a healthy lifestyle and getting preventive care are important in promoting health and wellness.  Follow your health care provider's instructions about healthy diet, exercising, and getting tested or screened for diseases.  Follow your health care provider's instructions on monitoring your cholesterol and blood pressure. This information is not intended to replace advice given to you by your health care provider. Make sure you discuss any questions you have with your health care provider. Document Revised: 09/20/2018 Document Reviewed: 09/20/2018 Elsevier Patient Education  2021 [employer].

## 2021-02-06 ENCOUNTER — Other Ambulatory Visit: Payer: Self-pay

## 2021-02-06 ENCOUNTER — Encounter: Payer: Self-pay | Admitting: Internal Medicine

## 2021-02-06 ENCOUNTER — Ambulatory Visit (INDEPENDENT_AMBULATORY_CARE_PROVIDER_SITE_OTHER): Payer: Medicare Other

## 2021-02-06 ENCOUNTER — Ambulatory Visit (INDEPENDENT_AMBULATORY_CARE_PROVIDER_SITE_OTHER): Payer: Medicare Other | Admitting: Internal Medicine

## 2021-02-06 VITALS — BP 120/80 | HR 70 | Temp 98.1°F | Ht 66.0 in | Wt 250.0 lb

## 2021-02-06 DIAGNOSIS — F410 Panic disorder [episodic paroxysmal anxiety] without agoraphobia: Secondary | ICD-10-CM

## 2021-02-06 DIAGNOSIS — Z0000 Encounter for general adult medical examination without abnormal findings: Secondary | ICD-10-CM | POA: Diagnosis not present

## 2021-02-06 DIAGNOSIS — K219 Gastro-esophageal reflux disease without esophagitis: Secondary | ICD-10-CM

## 2021-02-06 DIAGNOSIS — I10 Essential (primary) hypertension: Secondary | ICD-10-CM

## 2021-02-06 DIAGNOSIS — M8589 Other specified disorders of bone density and structure, multiple sites: Secondary | ICD-10-CM | POA: Diagnosis not present

## 2021-02-06 DIAGNOSIS — J301 Allergic rhinitis due to pollen: Secondary | ICD-10-CM

## 2021-02-06 DIAGNOSIS — E119 Type 2 diabetes mellitus without complications: Secondary | ICD-10-CM

## 2021-02-06 DIAGNOSIS — J069 Acute upper respiratory infection, unspecified: Secondary | ICD-10-CM

## 2021-02-06 DIAGNOSIS — F331 Major depressive disorder, recurrent, moderate: Secondary | ICD-10-CM

## 2021-02-06 DIAGNOSIS — E7849 Other hyperlipidemia: Secondary | ICD-10-CM | POA: Diagnosis not present

## 2021-02-06 DIAGNOSIS — E538 Deficiency of other specified B group vitamins: Secondary | ICD-10-CM

## 2021-02-06 MED ORDER — LINACLOTIDE 290 MCG PO CAPS: 290.0000 ug | ORAL_CAPSULE | Freq: Every day | ORAL | 2 refills | Status: DC

## 2021-02-06 MED ORDER — LEVOCETIRIZINE DIHYDROCHLORIDE 5 MG PO TABS: 5.0000 mg | ORAL_TABLET | Freq: Every evening | ORAL | 2 refills | Status: DC

## 2021-02-06 MED ORDER — FUROSEMIDE 40 MG PO TABS: 40.0000 mg | ORAL_TABLET | Freq: Two times a day (BID) | ORAL | 1 refills | Status: DC

## 2021-02-06 MED ORDER — ALBUTEROL SULFATE HFA 108 (90 BASE) MCG/ACT IN AERS: 2.0000 | INHALATION_SPRAY | Freq: Four times a day (QID) | RESPIRATORY_TRACT | 3 refills | Status: DC | PRN

## 2021-02-06 MED ORDER — CYANOCOBALAMIN 1000 MCG/ML IJ SOLN
1000.0000 ug | Freq: Once | INTRAMUSCULAR | Status: AC
  Administered 2021-02-06: 1000 ug via INTRAMUSCULAR

## 2021-02-06 MED ORDER — OMEPRAZOLE 20 MG PO CPDR: 20.0000 mg | DELAYED_RELEASE_CAPSULE | Freq: Every day | ORAL | 2 refills | Status: DC

## 2021-02-06 NOTE — Patient Instructions (Signed)
Erica [name] , Thank you for taking time to come for your Medicare Wellness Visit. I appreciate your ongoing commitment to your health goals. Please review the following plan we discussed and let me know if I can assist you in the future.   Screening recommendations/referrals: Colonoscopy: 05/08/2014; due every 10 years Mammogram: 01/05/2021; due every 1-2 years Bone Density: 05/18/2019; due every 3 years Recommended yearly ophthalmology/optometry visit for glaucoma screening and checkup Recommended yearly dental visit for hygiene and checkup  Vaccinations: Influenza vaccine: 10/31/2020 Pneumococcal vaccine: 05/18/2019, 06/02/2020 Tdap vaccine: 09/09/2014; due every 10 years Shingles vaccine: 08/07/2019, 10/28/2019   Covid-19: 12/19/2019, 01/09/2020, 08/05/2020   Advanced directives: Please bring a copy of your health care power of attorney and living will to the office at your convenience.  Conditions/risks identified: Yes; Reviewed health maintenance screenings with patient today and relevant education, vaccines, and/or referrals were provided. Please continue to do your personal lifestyle choices by: daily care of teeth and gums, regular physical activity (goal should be 5 days a week for 30 minutes), eat a healthy diet, avoid tobacco and drug use, limiting any alcohol intake, taking a low-dose aspirin (if not allergic or have been advised by your provider otherwise) and taking vitamins and minerals as recommended by your provider. Continue doing brain stimulating activities (puzzles, reading, adult coloring books, staying active) to keep memory sharp. Continue to eat heart healthy diet (full of fruits, vegetables, whole grains, lean protein, water--limit salt, fat, and sugar intake) and increase physical activity as tolerated.  Next appointment: Please schedule your next Medicare Wellness Visit with your Nurse Health Advisor in 1 year by calling [phone].   Preventive Care 53 Years and Older,  Female Preventive care refers to lifestyle choices and visits with your health care provider that can promote health and wellness. What does preventive care include?  A yearly physical exam. This is also called an annual well check.  Dental exams once or twice a year.  Routine eye exams. Ask your health care provider how often you should have your eyes checked.  Personal lifestyle choices, including:  Daily care of your teeth and gums.  Regular physical activity.  Eating a healthy diet.  Avoiding tobacco and drug use.  Limiting alcohol use.  Practicing safe sex.  Taking low-dose aspirin every day.  Taking vitamin and mineral supplements as recommended by your health care provider. What happens during an annual well check? The services and screenings done by your health care provider during your annual well check will depend on your age, overall health, lifestyle risk factors, and family history of disease. Counseling  Your health care provider may ask you questions about your:  Alcohol use.  Tobacco use.  Drug use.  Emotional well-being.  Home and relationship well-being.  Sexual activity.  Eating habits.  History of falls.  Memory and ability to understand (cognition).  Work and work Statistician.  Reproductive health. Screening  You may have the following tests or measurements:  Height, weight, and BMI.  Blood pressure.  Lipid and cholesterol levels. These may be checked every 5 years, or more frequently if you are over 10 years old.  Skin check.  Lung cancer screening. You may have this screening every year starting at age 58 if you have a 30-pack-year history of smoking and currently smoke or have quit within the past 15 years.  Fecal occult blood test (FOBT) of the stool. You may have this test every year starting at age 52.  Flexible sigmoidoscopy or  colonoscopy. You may have a sigmoidoscopy every 5 years or a colonoscopy every 10 years  starting at age 78.  Hepatitis C blood test.  Hepatitis B blood test.  Sexually transmitted disease (STD) testing.  Diabetes screening. This is done by checking your blood sugar (glucose) after you have not eaten for a while (fasting). You may have this done every 1-3 years.  Bone density scan. This is done to screen for osteoporosis. You may have this done starting at age 85.  Mammogram. This may be done every 1-2 years. Talk to your health care provider about how often you should have regular mammograms. Talk with your health care provider about your test results, treatment options, and if necessary, the need for more tests. Vaccines  Your health care provider may recommend certain vaccines, such as:  Influenza vaccine. This is recommended every year.  Tetanus, diphtheria, and acellular pertussis (Tdap, Td) vaccine. You may need a Td booster every 10 years.  Zoster vaccine. You may need this after age 54.  Pneumococcal 13-valent conjugate (PCV13) vaccine. One dose is recommended after age 84.  Pneumococcal polysaccharide (PPSV23) vaccine. One dose is recommended after age 85. Talk to your health care provider about which screenings and vaccines you need and how often you need them. This information is not intended to replace advice given to you by your health care provider. Make sure you discuss any questions you have with your health care provider. Document Released: 10/24/2015 Document Revised: 06/16/2016 Document Reviewed: 07/29/2015 Elsevier Interactive Patient Education  2017 [place] Prevention in the Home Falls can cause injuries. They can happen to people of all ages. There are many things you can do to make your home safe and to help prevent falls. What can I do on the outside of my home?  Regularly fix the edges of walkways and driveways and fix any cracks.  Remove anything that might make you trip as you walk through a door, such as a raised step or  threshold.  Trim any bushes or trees on the path to your home.  Use bright outdoor lighting.  Clear any walking paths of anything that might make someone trip, such as rocks or tools.  Regularly check to see if handrails are loose or broken. Make sure that both sides of any steps have handrails.  Any raised decks and porches should have guardrails on the edges.  Have any leaves, snow, or ice cleared regularly.  Use sand or salt on walking paths during winter.  Clean up any spills in your garage right away. This includes oil or grease spills. What can I do in the bathroom?  Use night lights.  Install grab bars by the toilet and in the tub and shower. Do not use towel bars as grab bars.  Use non-skid mats or decals in the tub or shower.  If you need to sit down in the shower, use a plastic, non-slip stool.  Keep the floor dry. Clean up any water that spills on the floor as soon as it happens.  Remove soap buildup in the tub or shower regularly.  Attach bath mats securely with double-sided non-slip rug tape.  Do not have throw rugs and other things on the floor that can make you trip. What can I do in the bedroom?  Use night lights.  Make sure that you have a light by your bed that is easy to reach.  Do not use any sheets or blankets that are too  big for your bed. They should not hang down onto the floor.  Have a firm chair that has side arms. You can use this for support while you get dressed.  Do not have throw rugs and other things on the floor that can make you trip. What can I do in the kitchen?  Clean up any spills right away.  Avoid walking on wet floors.  Keep items that you use a lot in easy-to-reach places.  If you need to reach something above you, use a strong step stool that has a grab bar.  Keep electrical cords out of the way.  Do not use floor polish or wax that makes floors slippery. If you must use wax, use non-skid floor wax.  Do not have  throw rugs and other things on the floor that can make you trip. What can I do with my stairs?  Do not leave any items on the stairs.  Make sure that there are handrails on both sides of the stairs and use them. Fix handrails that are broken or loose. Make sure that handrails are as long as the stairways.  Check any carpeting to make sure that it is firmly attached to the stairs. Fix any carpet that is loose or worn.  Avoid having throw rugs at the top or bottom of the stairs. If you do have throw rugs, attach them to the floor with carpet tape.  Make sure that you have a light switch at the top of the stairs and the bottom of the stairs. If you do not have them, ask someone to add them for you. What else can I do to help prevent falls?  Wear shoes that:  Do not have high heels.  Have rubber bottoms.  Are comfortable and fit you well.  Are closed at the toe. Do not wear sandals.  If you use a stepladder:  Make sure that it is fully opened. Do not climb a closed stepladder.  Make sure that both sides of the stepladder are locked into place.  Ask someone to hold it for you, if possible.  Clearly mark and make sure that you can see:  Any grab bars or handrails.  First and last steps.  Where the edge of each step is.  Use tools that help you move around (mobility aids) if they are needed. These include:  Canes.  Walkers.  Scooters.  Crutches.  Turn on the lights when you go into a dark area. Replace any light bulbs as soon as they burn out.  Set up your furniture so you have a clear path. Avoid moving your furniture around.  If any of your floors are uneven, fix them.  If there are any pets around you, be aware of where they are.  Review your medicines with your doctor. Some medicines can make you feel dizzy. This can increase your chance of falling. Ask your doctor what other things that you can do to help prevent falls. This information is not intended to  replace advice given to you by your health care provider. Make sure you discuss any questions you have with your health care provider. Document Released: 07/24/2009 Document Revised: 03/04/2016 Document Reviewed: 11/01/2014 Elsevier Interactive Patient Education  2017 [employer].

## 2021-02-06 NOTE — Progress Notes (Signed)
Subjective:   Erica [name] is a 65 y.o. female who presents for Medicare Annual (Subsequent) preventive examination.  Review of Systems    No ROS. Medicare Wellness Visit. Additional risk factors are reflected in social history. Cardiac Risk Factors include: advanced age (>9mn, >>25women);diabetes mellitus;dyslipidemia;hypertension;family history of premature cardiovascular disease;obesity (BMI >30kg/m2)     Objective:    Today's Vitals   02/06/21 0955  BP: 120/80  Pulse: 70  Temp: 98.1 F (36.7 C)  SpO2: 98%  Weight: 250 lb (113.4 kg)  Height: 5' 6"  (1.676 m)  PainSc: 0-No pain   Body mass index is 40.35 kg/m.  Advanced Directives 02/06/2021 01/27/2021 01/01/2021 09/14/2020 09/12/2020 06/07/2019 05/11/2019  Does Patient Have a Medical Advance Directive? Yes Yes Yes No No No No  Type of [place] will Living will - - - - -  Does patient want to make changes to medical advance directive? No - Patient declined - Yes (MAU/Ambulatory/Procedural Areas - Information given) - - - -  [place]? No - copy requested - - - - - -  Would patient like information on creating a medical advance directive? - - - - No - Patient declined No - Patient declined No - Patient declined    Current Medications (verified) Outpatient Encounter Medications as of 02/06/2021  Medication Sig  . albuterol (VENTOLIN HFA) 108 (90 Base) MCG/ACT inhaler Inhale 2 puffs into the lungs every 6 (six) hours as needed for wheezing or shortness of breath.  . ALPRAZolam (XANAX) 1 MG tablet Take 1 mg by mouth as needed for anxiety.  . AMBULATORY NON FORMULARY MEDICATION 4 Prong cane  . atenolol (TENORMIN) 25 MG tablet Take 218m(1 tablet) in the AM and 5014m2 tablets) in the PM  . blood glucose meter kit and supplies KIT Dispense based on patient and insurance preference. Use up to four times daily as directed. (FOR E11.9).  .  fluticasone (FLONASE) 50 MCG/ACT nasal spray Place 2 sprays into both nostrils daily.  . hydrOXYzine (ATARAX/VISTARIL) 10 MG tablet Take 1 tablet (10 mg total) by mouth 3 (three) times daily as needed for itching.  . linaclotide (LINZESS) 290 MCG CAPS capsule Take 1 capsule (290 mcg total) by mouth daily before breakfast. 2 week supply until she receive mail order  . metFORMIN (GLUCOPHAGE) 500 MG tablet Take 1 tablet (500 mg total) by mouth daily with breakfast. 2 week supply until she receive mail order  . Multiple Vitamins-Minerals (MULTIVITAMIN WITH MINERALS) tablet Take 1 tablet by mouth daily.  . ondansetron (ZOFRAN) 4 MG tablet Take 1 tablet (4 mg total) by mouth every 8 (eight) hours as needed for nausea or vomiting.  . potassium chloride SA (KLOR-CON) 20 MEQ tablet Take 1 tablet (20 mEq total) by mouth 2 (two) times daily. 2 week supply until she receive mail order (Patient taking differently: Take 20 mEq by mouth daily. 1 tablet by mouth daily)  . propranolol (INDERAL) 10 MG tablet Take 1 tablet (10 mg total) by mouth 3 (three) times daily as needed (PALPITATIONS).  . rosuvastatin (CRESTOR) 20 MG tablet Take 1 tablet (20 mg total) by mouth daily.  . Semaglutide,0.25 or 0.5MG/DOS, (OZEMPIC, 0.25 OR 0.5 MG/DOSE,) 2 MG/1.5ML SOPN Inject 0.25 mg into the skin once a week for 30 days, THEN 0.5 mg once a week.  . SUMAtriptan-naproxen (TREXIMET) 85-500 MG tablet Take 1 pill as needed for migraine, if migraine persists  may repeat x1 after 2 hours  . tiZANidine (ZANAFLEX) 4 MG tablet TAKE 1 TABLET BY MOUTH AT BEDTIME  . venlafaxine XR (EFFEXOR-XR) 150 MG 24 hr capsule Take 1 capsule (150 mg total) by mouth 2 (two) times daily.  . Vitamin D, Ergocalciferol, (DRISDOL) 1.25 MG (50000 UNIT) CAPS capsule Take 1 capsule (50,000 Units total) by mouth every 7 (seven) days.  . [DISCONTINUED] furosemide (LASIX) 40 MG tablet Take 1 tablet (40 mg total) by mouth 2 (two) times daily.  . [DISCONTINUED]  levocetirizine (XYZAL) 5 MG tablet Take 1 tablet (5 mg total) by mouth every evening.  . [DISCONTINUED] omeprazole (PRILOSEC) 20 MG capsule Take 1 capsule (20 mg total) by mouth daily. 2 week supply until she receive mail order  . [DISCONTINUED] oxycodone (OXY-IR) 5 MG capsule Take 5 mg by mouth every 4 (four) hours as needed. For pain.   No facility-administered encounter medications on file as of 02/06/2021.    Allergies (verified) Seroquel [quetiapine fumarate], Codeine, Guaifenesin-codeine, and Wellbutrin [bupropion]   History: Past Medical History:  Diagnosis Date  . Abnormal stress test    a. 09/2016: NST showed a small defect of mild severity present in the basal inferoseptal and mid inferoseptal location, consistent with ischemia. --> medically managed  . ALLERGIC RHINITIS   . Anxiety   . Bursitis   . DDD (degenerative disc disease), lumbar    ESI with Ramos (spring 2016)  . Depression   . Diabetes mellitus without complication ([place])   . Dyslipidemia   . External hemorrhoids   . GERD (gastroesophageal reflux disease)   . Hypertension   . Obesity   . Osteoarthritis   . Palpitations    a. prior event monitor showing sinus tachycardia, no PAF.   Marland Kitchen Panic attacks    Hx of depression  . Sleep apnea    CPAP hs   Past Surgical History:  Procedure Laterality Date  . ABDOMINAL HYSTERECTOMY    . MASS EXCISION Left 12/30/2015   Procedure: EXCISION LEFT LEG MASS;  Surgeon: Donnie Mesa, MD;  Location: [facility];  Service: General;  Laterality: Left;  . REPLACEMENT TOTAL KNEE Right   . RIGHT OOPHORECTOMY     No cancer   Family History  Problem Relation Age of Onset  . Heart disease Mother        Enlarged Heart, Pacemaker  . Diabetes Mother   . Thyroid disease Mother   . Hyperlipidemia Mother   . Hypertension Mother   . Sleep apnea Mother   . Dementia Father   . Kidney failure Father   . Prostate cancer Father   . Alcoholism Father   . Asthma Other    . Allergies Sister   . Asthma Son   . Breast cancer Maternal Aunt        cousin  . Colon cancer Cousin   . Heart disease Son        CHF, morbid obesity  . Prostate cancer Maternal Uncle   . Diabetes Son    Social History   Socioeconomic History  . Marital status: Married    Spouse name: [name]  . Number of children: 3  . Years of education: Not on file  . Highest education level: Not on file  Occupational History  . Occupation: Disabled  Tobacco Use  . Smoking status: Never Smoker  . Smokeless tobacco: Never Used  . Tobacco comment: tried to as a teenager  Vaping Use  . Vaping Use: Never used  Substance and Sexual Activity  . Alcohol use: Yes    Alcohol/week: 1.0 standard drink    Types: 1 Glasses of wine per week    Comment: one drink a month  . Drug use: No  . Sexual activity: Not Currently  Other Topics Concern  . Not on file  Social History Narrative   Married with children. Pt is on disablity. Previous worked in the school system   Social Determinants of Radio broadcast assistant Strain: [place]   . Difficulty of Paying Living Expenses: Not hard at all  Food Insecurity: No Food Insecurity  . Worried About Charity fundraiser in the Last Year: Never true  . Ran Out of Food in the Last Year: Never true  Transportation Needs: No Transportation Needs  . Lack of Transportation (Medical): No  . Lack of Transportation (Non-Medical): No  Physical Activity: Inactive  . Days of Exercise per Week: 0 days  . Minutes of Exercise per Session: 0 min  Stress: No Stress Concern Present  . Feeling of Stress : Not at all  Social Connections: Socially Integrated  . Frequency of Communication with Friends and Family: More than three times a week  . Frequency of Social Gatherings with Friends and Family: Once a week  . Attends Religious Services: More than 4 times per year  . Active Member of Clubs or Organizations: No  . Attends Archivist Meetings: More than 4  times per year  . Marital Status: Married    Tobacco Counseling Counseling given: Not Answered Comment: tried to as a teenager   Clinical Intake:  Pre-visit preparation completed: Yes  Pain : No/denies pain Pain Score: 0-No pain     BMI - recorded: 40.35 Nutritional Status: BMI > 30  Obese Nutritional Risks: None Diabetes: Yes CBG done?: No Did pt. bring in CBG monitor from home?: No  How often do you need to have someone help you when you read instructions, pamphlets, or other written materials from your doctor or pharmacy?: 1 - Never What is the last grade level you completed in school?: High School Graduate  Diabetic? yes  Interpreter Needed?: No  Information entered by :: Lisette Abu, LPN.   Activities of Daily Living In your present state of health, do you have any difficulty performing the following activities: 02/06/2021 12/08/2020  Hearing? N N  Vision? N N  Difficulty concentrating or making decisions? N N  Walking or climbing stairs? N N  Dressing or bathing? N N  Doing errands, shopping? N N  Preparing Food and eating ? N -  Using the Toilet? N -  In the past six months, have you accidently leaked urine? N -  Do you have problems with loss of bowel control? N -  Managing your Medications? N -  Managing your Finances? N -  Housekeeping or managing your Housekeeping? N -  Some recent data might be hidden    Patient Care Team: [name], MD as PCP - General (Internal Medicine) Lafayette Dragon, MD (Inactive) as Consulting Physician (Gastroenterology) Azucena Fallen, MD as Consulting Physician (Obstetrics and Gynecology) Suella Broad, MD as Consulting Physician (Physical Medicine and Rehabilitation) Gaynelle Arabian, MD as Consulting Physician (Orthopedic Surgery) Chesley Mires, MD (Pulmonary Disease) Charlton Haws, [facility] as Pharmacist (Pharmacist)  Indicate any recent Medical Services you may have received from other than [facility] providers  in the past year (date may be approximate).     Assessment:   This  is a routine wellness examination for [place].  Hearing/Vision screen No exam data present  Dietary issues and exercise activities discussed: Current Exercise Habits: The patient does not participate in regular exercise at present (Will start water aerobics and physical therapy for knee pain and bursa), Exercise limited by: orthopedic condition(s)  Goals    . Manage My Medicine     Timeframe:  Long-Range Goal Priority:  Medium Start Date:       01/22/21                      Expected End Date:   08/09/21                    Follow Up Date 05/09/21   - call for medicine refill 2 or 3 days before it runs out - call if I am sick and can't take my medicine - keep a list of all the medicines I take; vitamins and herbals too  -Start taking fiber supplement   Why is this important?   . These steps will help you keep on track with your medicines.   Notes:     . Patient Stated     Lose weight, monitor diet for carbohydrates and exercise; lose around 50 pounds.      Depression Screen PHQ 2/9 Scores 02/06/2021 01/01/2021 12/08/2020 06/05/2020 09/14/2019 07/05/2018 06/07/2018  PHQ - 2 Score 0 1 2 2 1 2 2   PHQ- 9 Score - - 13 11 9 13 8   Some encounter information is confidential and restricted. Go to Review Flowsheets activity to see all data.    Fall Risk Fall Risk  02/06/2021 01/01/2021 12/08/2020 08/31/2019 09/27/2017  Falls in the past year? 0 0 0 0 No  Comment - - - Emmi Telephone Survey: data to providers prior to load -  Number falls in past yr: 0 0 0 - -  Injury with Fall? 0 - 0 - -  Risk for fall due to : No Fall Risks - No Fall Risks - -  Follow up Falls evaluation completed - Falls evaluation completed - -    FALL RISK PREVENTION PERTAINING TO THE HOME:  Any stairs in or around the home? No  If so, are there any without handrails? No  Home free of loose throw rugs in walkways, pet beds, electrical cords, etc?  Yes  Adequate lighting in your home to reduce risk of falls? Yes   ASSISTIVE DEVICES UTILIZED TO PREVENT FALLS:  Life alert? No  Use of a cane, walker or w/c? No  Grab bars in the bathroom? No  Shower chair or bench in shower? No  Elevated toilet seat or a handicapped toilet? No   TIMED UP AND GO:  Was the test performed? No .  Length of time to ambulate 10 feet: 0 sec.   Gait steady and fast without use of assistive device  Cognitive Function: Normal cognitive status assessed by direct observation by this Nurse Health Advisor. No abnormalities found.   MMSE - Mini Mental State Exam 09/27/2017  Orientation to time 5  Orientation to Place 5  Registration 3  Attention/ Calculation 3  Recall 3  Language- name 2 objects 2  Language- repeat 1  Language- follow 3 step command 3  Language- read & follow direction 1  Write a sentence 1  Copy design 1  Total score 28        Immunizations Immunization History  Administered Date(s) Administered  .  Fluad Quad(high Dose 65+) 07/14/2019, 10/31/2020  . Influenza,inj,Quad PF,6+ Mos 07/25/2013, 09/09/2014, 08/22/2015, 09/27/2016, 09/27/2017, 08/01/2018  . PFIZER(Purple Top)SARS-COV-2 Vaccination 12/19/2019, 01/09/2020, 08/05/2020  . Pneumococcal Conjugate-13 05/18/2019  . Pneumococcal Polysaccharide-23 01/30/2009, 06/02/2020  . Td 03/17/2004  . Tdap 09/09/2014  . Zoster Recombinat (Shingrix) 08/07/2019, 10/28/2019    TDAP status: Up to date  Flu Vaccine status: Up to date  Pneumococcal vaccine status: Up to date  Covid-19 vaccine status: Completed vaccines  Qualifies for Shingles Vaccine? Yes   Zostavax completed Yes   Shingrix Completed?: Yes  Screening Tests Health Maintenance  Topic Date Due  . OPHTHALMOLOGY EXAM  11/15/2018  . INFLUENZA VACCINE  05/11/2021  . FOOT EXAM  06/02/2021  . HEMOGLOBIN A1C  06/07/2021  . URINE MICROALBUMIN  12/08/2021  . DEXA SCAN  05/17/2022  . MAMMOGRAM  01/06/2023  . COLONOSCOPY  (Pts 45-34yr Insurance coverage will need to be confirmed)  05/08/2024  . TETANUS/TDAP  09/09/2024  . COVID-19 Vaccine  Completed  . Hepatitis C Screening  Completed  . PNA vac Low Risk Adult  Completed  . HPV VACCINES  Aged Out    Health Maintenance  Health Maintenance Due  Topic Date Due  . OPHTHALMOLOGY EXAM  11/15/2018    Colorectal cancer screening: Type of screening: Colonoscopy. Completed 05/08/2014. Repeat every 10 years  Mammogram status: Completed 01/05/2021. Repeat every year  Bone Density status: Completed 05/18/2019. Results reflect: Bone density results: OSTEOPENIA. Repeat every 3 years.  Lung Cancer Screening: (Low Dose CT Chest recommended if Age 67-80years, 30 pack-year currently smoking OR have quit w/in 15years.) does not qualify.   Lung Cancer Screening Referral: no  Additional Screening:  Hepatitis C Screening: does qualify; Completed: yes  Vision Screening: Recommended annual ophthalmology exams for early detection of glaucoma and other disorders of the eye. Is the patient up to date with their annual eye exam?  Yes  Who is the provider or what is the name of the office in which the patient attends annual [facility] exams[facility] If pt is not established with a provider, would they like to be referred to a provider to establish care? No .   Dental Screening: Recommended annual dental exams for proper oral hygiene  Community Resource Referral / Chronic Care Management: CRR required this visit?  No   CCM required this visit?  No      Plan:     I have personally reviewed and noted the following in the patient's chart:   . Medical and social history . Use of alcohol, tobacco or illicit drugs  . Current medications and supplements . Functional ability and status . Nutritional status . Physical activity . Advanced directives . List of other physicians . Hospitalizations, surgeries, and ER visits in previous 12 months . Vitals . Screenings to  include cognitive, depression, and falls . Referrals and appointments  In addition, I have reviewed and discussed with patient certain preventive protocols, quality metrics, and best practice recommendations. A written personalized care plan for preventive services as well as general preventive health recommendations were provided to patient.     SSheral Flow LPN   [id number]  Nurse Notes:  Medications reviewed with patient; no opioid use noted.

## 2021-02-07 MED ORDER — OZEMPIC (0.25 OR 0.5 MG/DOSE) 2 MG/1.5ML ~~LOC~~ SOPN: 0.5000 mg | PEN_INJECTOR | SUBCUTANEOUS | 1 refills | Status: DC

## 2021-02-07 NOTE — Assessment & Plan Note (Signed)
New B12 injection today Start 1000 mcg daily

## 2021-02-07 NOTE — Assessment & Plan Note (Signed)
Chronic BP well controlled Continue atenolol 25 mg q am and 50 g qpm, lasix 40 mg bid,  cmp

## 2021-02-07 NOTE — Assessment & Plan Note (Signed)
Chronic dexa up to date Encouraged regular exercise Continue vitamin d

## 2021-02-07 NOTE — Assessment & Plan Note (Signed)
Chronic Management per psychiatry 

## 2021-02-07 NOTE — Assessment & Plan Note (Signed)
Chronic GERD controlled Continue omeprazole 20 mg daily  

## 2021-02-07 NOTE — Assessment & Plan Note (Signed)
Chronic Continue crestor 20 mg daily Regular exercise and healthy diet encouraged

## 2021-02-07 NOTE — Assessment & Plan Note (Signed)
Chronic Controlled Lab Results  Component Value Date   HGBA1C 6.6 (H) 12/08/2020   Continue metformin 500 mg daily Continue ozempic 0.5 mg weekly

## 2021-02-09 ENCOUNTER — Telehealth: Payer: Self-pay | Admitting: Physical Therapy

## 2021-02-09 ENCOUNTER — Ambulatory Visit: Payer: Medicare Other | Attending: Family Medicine | Admitting: Physical Therapy

## 2021-02-09 DIAGNOSIS — G8929 Other chronic pain: Secondary | ICD-10-CM | POA: Insufficient documentation

## 2021-02-09 DIAGNOSIS — M545 Low back pain, unspecified: Secondary | ICD-10-CM | POA: Insufficient documentation

## 2021-02-09 DIAGNOSIS — M6281 Muscle weakness (generalized): Secondary | ICD-10-CM | POA: Insufficient documentation

## 2021-02-09 DIAGNOSIS — R262 Difficulty in walking, not elsewhere classified: Secondary | ICD-10-CM | POA: Insufficient documentation

## 2021-02-09 DIAGNOSIS — M25551 Pain in right hip: Secondary | ICD-10-CM | POA: Insufficient documentation

## 2021-02-09 DIAGNOSIS — M25552 Pain in left hip: Secondary | ICD-10-CM | POA: Insufficient documentation

## 2021-02-09 DIAGNOSIS — M25561 Pain in right knee: Secondary | ICD-10-CM | POA: Insufficient documentation

## 2021-02-09 NOTE — Telephone Encounter (Signed)
Attempted to contact patient due to missed PT appointment. Left VM informing patient of missed appointment and reminder of next scheduled appointment on 02/12/2021 at 11:45am. Patient reminded of attendance policy, this is her first missed appointment and if she misses two then her future appointments would be canceled and she would only be able to schedule 1 visit at a time. Patient encouraged to contact the PT clinic if she needs to reschedule or cancel future visits.  Hilda Blades, PT, DPT, LAT, ATC 02/09/21  1:29 PM Phone: [phone] Fax: [phone]

## 2021-02-12 ENCOUNTER — Ambulatory Visit: Payer: Medicare Other | Admitting: Registered"

## 2021-02-12 ENCOUNTER — Other Ambulatory Visit: Payer: Self-pay

## 2021-02-12 ENCOUNTER — Ambulatory Visit: Payer: Medicare Other | Admitting: Physical Therapy

## 2021-02-12 DIAGNOSIS — M25552 Pain in left hip: Secondary | ICD-10-CM

## 2021-02-12 DIAGNOSIS — M25551 Pain in right hip: Secondary | ICD-10-CM

## 2021-02-12 DIAGNOSIS — R262 Difficulty in walking, not elsewhere classified: Secondary | ICD-10-CM | POA: Diagnosis not present

## 2021-02-12 DIAGNOSIS — M545 Low back pain, unspecified: Secondary | ICD-10-CM

## 2021-02-12 DIAGNOSIS — M25561 Pain in right knee: Secondary | ICD-10-CM | POA: Diagnosis not present

## 2021-02-12 DIAGNOSIS — M6281 Muscle weakness (generalized): Secondary | ICD-10-CM | POA: Diagnosis not present

## 2021-02-12 DIAGNOSIS — G8929 Other chronic pain: Secondary | ICD-10-CM | POA: Diagnosis not present

## 2021-02-12 NOTE — Therapy (Signed)
[place], [place], [address] Phone: [phone]   Fax:  [phone]  Physical Therapy Treatment  Patient Details  Name: Erica [name] MRN: [id number] Date of Birth: [birth year] Referring Provider (PT): Lyndal Pulley, DO   Encounter Date: 02/12/2021   PT End of Session - 02/12/21 1157    Visit Number 2    Number of Visits 17    Date for PT Re-Evaluation 03/28/21    Authorization Type UHC MCR    Progress Note Due on Visit 10    PT Start Time 1157   late arrival   PT Stop Time 1230    PT Time Calculation (min) 33 min    Activity Tolerance Patient tolerated treatment well    Behavior During Therapy [facility] for tasks assessed/performed           Past Medical History:  Diagnosis Date  . Abnormal stress test    a. 09/2016: NST showed a small defect of mild severity present in the basal inferoseptal and mid inferoseptal location, consistent with ischemia. --> medically managed  . ALLERGIC RHINITIS   . Anxiety   . Bursitis   . DDD (degenerative disc disease), lumbar    ESI with Ramos (spring 2016)  . Depression   . Diabetes mellitus without complication ([place])   . Dyslipidemia   . External hemorrhoids   . GERD (gastroesophageal reflux disease)   . Hypertension   . Obesity   . Osteoarthritis   . Palpitations    a. prior event monitor showing sinus tachycardia, no PAF.   Marland Kitchen Panic attacks    Hx of depression  . Sleep apnea    CPAP hs    Past Surgical History:  Procedure Laterality Date  . ABDOMINAL HYSTERECTOMY    . MASS EXCISION Left 12/30/2015   Procedure: EXCISION LEFT LEG MASS;  Surgeon: Donnie Mesa, MD;  Location: [place];  Service: General;  Laterality: Left;  . REPLACEMENT TOTAL KNEE Right   . RIGHT OOPHORECTOMY     No cancer    There were no vitals filed for this visit.   Subjective Assessment - 02/12/21 1200    Subjective Pt reports her vitamin B is low and  needs to get a shot. Her pain is "so so". Pt is to get an epidural shot.    Pertinent History See PMH above    Limitations Walking;Standing    How long can you sit comfortably? "I have to be in recliner and it doesn't bother me"    How long can you stand comfortably? "I can't stand long, maybe 15 minutes"    How long can you walk comfortably? "I try to walk around the circle, I might can walk 10 minutes."    Patient Stated Goals Feeling better    Currently in Pain? Yes    Pain Score 6     Pain Location Generalized    Pain Onset More than a month ago                             [facility] Adult PT Treatment/Exercise - 02/12/21 0001      Exercises   Exercises Knee/Hip      Knee/Hip Exercises: Stretches   Piriformis Stretch Right;Left;30 seconds    Other Knee/Hip Stretches Figure 4 stretch x30 sec bilat    Other Knee/Hip Stretches LTR x30 sec bilat      Knee/Hip  Exercises: Aerobic   Nustep L5 x 5 min LEs only      Knee/Hip Exercises: Supine   Bridges Strengthening;2 sets;10 reps;Both    Straight Leg Raises Strengthening;Right;Left;10 reps    Other Supine Knee/Hip Exercises PPT 10x 5 sec    Other Supine Knee/Hip Exercises Clamshell with PPT 2x10 red tband                  PT Education - 02/12/21 1204    Education Details Reviewed FOTO results. Discussed HEP.    Person(s) Educated Patient    Methods Explanation    Comprehension Verbalized understanding;Returned demonstration;Tactile cues required            PT Short Term Goals - 01/27/21 1124      PT SHORT TERM GOAL #1   Title Patient will be independent with initial HEP.    Baseline no time to issue at eval.    Time 3    Period Weeks    Status New    Target Date 02/17/21      PT SHORT TERM GOAL #2   Title Therapist will review FOTO and anticipated progress.    Baseline FOTO score captured at eval.    Time 2    Period Weeks    Status New    Target Date 02/10/21      PT SHORT TERM GOAL #3    Title Patient will demonstrate at least 100 degrees of Rt hip flexion AROM and 115 degrees of Rt knee flexion AROM to improve ability to complete shower/tub transfer.    Baseline 90 degrees and 105 degrees    Time 4    Period Weeks    Status New    Target Date 02/24/21      PT SHORT TERM GOAL #4   Title Patient will complete sit to stand transfer without use of UE to improve ease of transferring.    Baseline significant use of UE    Time 4    Period Weeks    Status New    Target Date 02/24/21      PT SHORT TERM GOAL #5   Title --    Baseline --    Time --    Period --    Status --    Target Date --             PT Long Term Goals - 01/27/21 1311      PT LONG TERM GOAL #1   Title Patient will demonstrate appropriate bending and lifting mechanics to reduce stress on low back with household activity.    Baseline pain and difficulty with bending activity    Time 8    Period Weeks    Status New    Target Date 03/24/21      PT LONG TERM GOAL #2   Title Patient will demonstrate at least 4/5 strength in bilateral hips to improve stability about the chain with prolonged standing/walking.    Baseline see flowsheet    Time 8    Period Weeks    Status New    Target Date 03/24/21      PT LONG TERM GOAL #3   Title Patient will maintain SLS for at least 3 seconds with no lateral trunk lean to improve stability and mechanics with walking.    Baseline unable RLE, 2 seconds LLE with lateral trunk lean    Time 8    Period Weeks    Status New  Target Date 03/24/21      PT LONG TERM GOAL #4   Title Patient will be independent with advanced HEP to progress/maintain current level of function and manage her chronic condition    Time 8    Period Weeks    Status New    Target Date 03/24/21                 Plan - 02/12/21 1202    Clinical Impression Statement Treatment focused on providing hip stretching and strengthening exercises. Initiated core stabilization. Pt  tolerated session well. Pt able to perform quad set on R but has difficulty performing SLR in full range.    Personal Factors and Comorbidities Age;Time since onset of injury/illness/exacerbation;Fitness    Examination-Activity Limitations Stand;Transfers;Locomotion Level    Stability/Clinical Decision Making Evolving/Moderate complexity    Rehab Potential Fair    PT Frequency 2x / week    PT Duration 8 weeks    PT Treatment/Interventions ADLs/Self Care Home Management;Aquatic Therapy;Cryotherapy;Moist Heat;Gait training;Stair training;Therapeutic activities;Therapeutic exercise;Balance training;Neuromuscular re-education;Patient/family education;Manual techniques;Passive range of motion;Dry needling;Taping    PT Next Visit Plan How was HEP? Continue to strengthen core, hip, and knee as able. Gait training    PT Home Exercise Plan Access Code: [phone]    Consulted and Agree with Plan of Care Patient           Patient will benefit from skilled therapeutic intervention in order to improve the following deficits and impairments:  Difficulty walking,Abnormal gait,Decreased range of motion,Decreased activity tolerance,Pain,Improper body mechanics,Decreased balance,Decreased strength,Postural dysfunction  Visit Diagnosis: Chronic bilateral low back pain, unspecified whether sciatica present  Hip pain, bilateral  Chronic pain of right knee  Muscle weakness (generalized)  Difficulty in walking, not elsewhere classified     Problem List Patient Active Problem List   Diagnosis Date Noted  . B12 deficiency 02/05/2021  . Morbid obesity ([place]) 12/08/2020  . Stenosis of carotid artery 09/18/2020  . Greater trochanteric bursitis of both hips 08/25/2020  . Greater trochanteric bursitis of right hip 05/05/2020  . 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 Chronic constipation 08/23/2019  . Abdominal pain 08/23/2019  . Bilateral carotid artery stenosis  06/04/2019  . Osteopenia 05/19/2019  . Left knee pain 12/11/2018  . Class 2 severe obesity with serious comorbidity and body mass index (BMI) of 37.0 to 37.9 in adult ([place]) 08/31/2018  . Degeneration of lumbar intervertebral disc 05/30/2018  . Onychomycosis 12/07/2017  . Left hip pain 06/12/2017  . Acute left-sided low back pain without sciatica 06/12/2017  . Meningioma ([place]) 03/28/2017  . Panic attacks 09/27/2016  . GERD (gastroesophageal reflux disease) 09/27/2016  . Right ankle pain 08/19/2016  . Diabetes ([place]) 02/15/2016  . Lump of skin 11/28/2015  . Migraine without aura and with status migrainosus, not intractable 10/03/2015  . Numbness of left hand 10/03/2015  . Cephalalgia 10/03/2015  . Palpitations 09/08/2012  . Sciatica of left side   . Peripheral edema 05/09/2012  . FATIGUE 02/23/2010  . Obstructive sleep apnea 10/31/2009  . External hemorrhoids 10/17/2009  . Hyperlipidemia 01/12/2008  . Depression, major, recurrent ([place]) 01/12/2008  . Essential hypertension, benign 01/12/2008  . Allergic rhinitis 01/12/2008    [facility] April Ma L Ruel Dimmick PT, DPT 02/12/2021, 1:00 PM  [facility] [address] [place], [place], [address] Phone: [phone]   Fax:  [phone]  Name: Erica [name] MRN: [id number] Date of Birth: March 28, 1956

## 2021-02-17 ENCOUNTER — Ambulatory Visit: Payer: Medicare Other | Admitting: Physical Therapy

## 2021-02-17 ENCOUNTER — Other Ambulatory Visit: Payer: Self-pay

## 2021-02-17 DIAGNOSIS — M25552 Pain in left hip: Secondary | ICD-10-CM

## 2021-02-17 DIAGNOSIS — G8929 Other chronic pain: Secondary | ICD-10-CM

## 2021-02-17 DIAGNOSIS — M25561 Pain in right knee: Secondary | ICD-10-CM

## 2021-02-17 DIAGNOSIS — M545 Low back pain, unspecified: Secondary | ICD-10-CM | POA: Diagnosis not present

## 2021-02-17 DIAGNOSIS — R262 Difficulty in walking, not elsewhere classified: Secondary | ICD-10-CM

## 2021-02-17 DIAGNOSIS — M6281 Muscle weakness (generalized): Secondary | ICD-10-CM | POA: Diagnosis not present

## 2021-02-17 DIAGNOSIS — M25551 Pain in right hip: Secondary | ICD-10-CM

## 2021-02-17 NOTE — Therapy (Addendum)
[place], [place], [address] Phone: [phone]   Fax:  [phone]  Physical Therapy Treatment/ Discharge  Patient Details  Name: Erica [name] MRN: [id number] Date of Birth: 09/25/1956 Referring Provider (PT): Lyndal Pulley, DO   Encounter Date: 02/17/2021   PT End of Session - 02/17/21 1156    Visit Number 3    Number of Visits 17    Date for PT Re-Evaluation 03/28/21    Authorization Type UHC MCR    PT Start Time 1157   late arrival   PT Stop Time 1230    PT Time Calculation (min) 33 min    Activity Tolerance Patient tolerated treatment well    Behavior During Therapy [facility] for tasks assessed/performed           Past Medical History:  Diagnosis Date  . Abnormal stress test    a. 09/2016: NST showed a small defect of mild severity present in the basal inferoseptal and mid inferoseptal location, consistent with ischemia. --> medically managed  . ALLERGIC RHINITIS   . Anxiety   . Bursitis   . DDD (degenerative disc disease), lumbar    ESI with Ramos (spring 2016)  . Depression   . Diabetes mellitus without complication ([place])   . Dyslipidemia   . External hemorrhoids   . GERD (gastroesophageal reflux disease)   . Hypertension   . Obesity   . Osteoarthritis   . Palpitations    a. prior event monitor showing sinus tachycardia, no PAF.   Marland Kitchen Panic attacks    Hx of depression  . Sleep apnea    CPAP hs    Past Surgical History:  Procedure Laterality Date  . ABDOMINAL HYSTERECTOMY    . MASS EXCISION Left 12/30/2015   Procedure: EXCISION LEFT LEG MASS;  Surgeon: Donnie Mesa, MD;  Location: [place];  Service: General;  Laterality: Left;  . REPLACEMENT TOTAL KNEE Right   . RIGHT OOPHORECTOMY     No cancer    There were no vitals filed for this visit.   Subjective Assessment - 02/17/21 1200    Subjective Pt reports nothing new or different. She reports she was sore  after last session. She states she has been able to do some of the exercises at home. Pt reports R knee is bothering her more.    Pertinent History See PMH above    Limitations Walking;Standing    How long can you sit comfortably? "I have to be in recliner and it doesn't bother me"    How long can you stand comfortably? "I can't stand long, maybe 15 minutes"    How long can you walk comfortably? "I try to walk around the circle, I might can walk 10 minutes."    Patient Stated Goals Feeling better    Currently in Pain? Yes    Pain Score 5     Pain Location Generalized    Pain Onset More than a month ago                             [facility] Adult PT Treatment/Exercise - 02/17/21 0001      Knee/Hip Exercises: Stretches   Passive Hamstring Stretch Right;Left;20 seconds    Piriformis Stretch Right;Left;30 seconds    Piriformis Stretch Limitations Seated    Other Knee/Hip Stretches Figure 4 stretch x30 sec bilat      Knee/Hip Exercises: Aerobic  Nustep L5 x 5 min LEs only      Knee/Hip Exercises: Seated   Heel Slides Strengthening;Right;10 reps      Knee/Hip Exercises: Supine   Heel Slides Strengthening;Right;Left;10 reps    Heel Slides Limitations with PPT    Bridges Strengthening;2 sets;10 reps;Both    Straight Leg Raises Strengthening;Right;Left;10 reps    Other Supine Knee/Hip Exercises PPT with ab set using pball 2x10    Other Supine Knee/Hip Exercises Attempted marching with PPT but pt with c/o R knee pain      Knee/Hip Exercises: Sidelying   Clams --                    PT Short Term Goals - 01/27/21 1124      PT SHORT TERM GOAL #1   Title Patient will be independent with initial HEP.    Baseline no time to issue at eval.    Time 3    Period Weeks    Status New    Target Date 02/17/21      PT SHORT TERM GOAL #2   Title Therapist will review FOTO and anticipated progress.    Baseline FOTO score captured at eval.    Time 2    Period Weeks     Status New    Target Date 02/10/21      PT SHORT TERM GOAL #3   Title Patient will demonstrate at least 100 degrees of Rt hip flexion AROM and 115 degrees of Rt knee flexion AROM to improve ability to complete shower/tub transfer.    Baseline 90 degrees and 105 degrees    Time 4    Period Weeks    Status New    Target Date 02/24/21      PT SHORT TERM GOAL #4   Title Patient will complete sit to stand transfer without use of UE to improve ease of transferring.    Baseline significant use of UE    Time 4    Period Weeks    Status New    Target Date 02/24/21      PT SHORT TERM GOAL #5   Title --    Baseline --    Time --    Period --    Status --    Target Date --             PT Long Term Goals - 01/27/21 1311      PT LONG TERM GOAL #1   Title Patient will demonstrate appropriate bending and lifting mechanics to reduce stress on low back with household activity.    Baseline pain and difficulty with bending activity    Time 8    Period Weeks    Status New    Target Date 03/24/21      PT LONG TERM GOAL #2   Title Patient will demonstrate at least 4/5 strength in bilateral hips to improve stability about the chain with prolonged standing/walking.    Baseline see flowsheet    Time 8    Period Weeks    Status New    Target Date 03/24/21      PT LONG TERM GOAL #3   Title Patient will maintain SLS for at least 3 seconds with no lateral trunk lean to improve stability and mechanics with walking.    Baseline unable RLE, 2 seconds LLE with lateral trunk lean    Time 8    Period Weeks    Status New  Target Date 03/24/21      PT LONG TERM GOAL #4   Title Patient will be independent with advanced HEP to progress/maintain current level of function and manage her chronic condition    Time 8    Period Weeks    Status New    Target Date 03/24/21                 Plan - 02/17/21 1246    Clinical Impression Statement Provided hip stretching alternatives in  sitting. Attempted to progress core stabilization -- pt with difficulty maintaining prolonged posterior pelvic tilt; worked on ab setting with pball. Pt remains limited by her R knee pain -- continued quad strengthening.    Personal Factors and Comorbidities Age;Time since onset of injury/illness/exacerbation;Fitness    Examination-Activity Limitations Stand;Transfers;Locomotion Level    Stability/Clinical Decision Making Evolving/Moderate complexity    Rehab Potential Fair    PT Frequency 2x / week    PT Duration 8 weeks    PT Treatment/Interventions ADLs/Self Care Home Management;Aquatic Therapy;Cryotherapy;Moist Heat;Gait training;Stair training;Therapeutic activities;Therapeutic exercise;Balance training;Neuromuscular re-education;Patient/family education;Manual techniques;Passive range of motion;Dry needling;Taping    PT Next Visit Plan How was HEP? Assess STGs. Continue to strengthen core, hip, and knee as able. Gait training    PT Home Exercise Plan Access Code: [email or web address].    Consulted and Agree with Plan of Care Patient           Patient will benefit from skilled therapeutic intervention in order to improve the following deficits and impairments:  Difficulty walking,Abnormal gait,Decreased range of motion,Decreased activity tolerance,Pain,Improper body mechanics,Decreased balance,Decreased strength,Postural dysfunction  Visit Diagnosis: Chronic bilateral low back pain, unspecified whether sciatica present  Hip pain, bilateral  Chronic pain of right knee  Muscle weakness (generalized)  Difficulty in walking, not elsewhere classified     Problem List Patient Active Problem List   Diagnosis Date Noted  . B12 deficiency 02/05/2021  . Morbid obesity ([place]) 12/08/2020  . Stenosis of carotid artery 09/18/2020  . Greater trochanteric bursitis of both hips 08/25/2020  . Greater trochanteric bursitis of right hip 05/05/2020  . Chronic knee pain after total replacement of right  knee joint 01/14/2020  . Chronic constipation 08/23/2019  . Abdominal pain 08/23/2019  . Bilateral carotid artery stenosis 06/04/2019  . Osteopenia 05/19/2019  . Left knee pain 12/11/2018  . Class 2 severe obesity with serious comorbidity and body mass index (BMI) of 37.0 to 37.9 in adult ([place]) 08/31/2018  . Degeneration of lumbar intervertebral disc 05/30/2018  . Onychomycosis 12/07/2017  . Left hip pain 06/12/2017  . Acute left-sided low back pain without sciatica 06/12/2017  . Meningioma ([place]) 03/28/2017  . Panic attacks 09/27/2016  . GERD (gastroesophageal reflux disease) 09/27/2016  . Right ankle pain 08/19/2016  . Diabetes ([place]) 02/15/2016  . Lump of skin 11/28/2015  . Migraine without aura and with status migrainosus, not intractable 10/03/2015  . Numbness of left hand 10/03/2015  . Cephalalgia 10/03/2015  . Palpitations 09/08/2012  . Sciatica of left side   . Peripheral edema 05/09/2012  . FATIGUE 02/23/2010  . Obstructive sleep apnea 10/31/2009  . External hemorrhoids 10/17/2009  . Hyperlipidemia 01/12/2008  . Depression, major, recurrent ([place]) 01/12/2008  . Essential hypertension, benign 01/12/2008  . Allergic rhinitis 01/12/2008    [facility] April Ma L Zareth Rippetoe PT, DPT 02/17/2021, 12:51 PM  [facility] [address] [place], [place], [address] Phone: [phone]   Fax:  [phone]  Name: Erica [name] MRN:  [id number] Date of Birth: 24-Nov-1955   PHYSICAL THERAPY DISCHARGE SUMMARY  Visits from Start of Care: 3  Current functional level related to goals / functional outcomes: See goals   Remaining deficits: Unknown   Education / Equipment: HEP  Plan: Patient agrees to discharge.  Patient goals were not met. Patient is being discharged due to not returning since the last visit.  ?????     Vanessa Talmage, PT, DPT

## 2021-02-19 ENCOUNTER — Ambulatory Visit: Payer: Medicare Other | Admitting: Physical Therapy

## 2021-02-19 IMAGING — CR CHEST - 2 VIEW
2 series · 2 of 2 positions shown · non-contrast
Comparison: 03/08/2017

CLINICAL DATA: Palpitations

EXAM:
CHEST - 2 VIEW

[w chest pa]
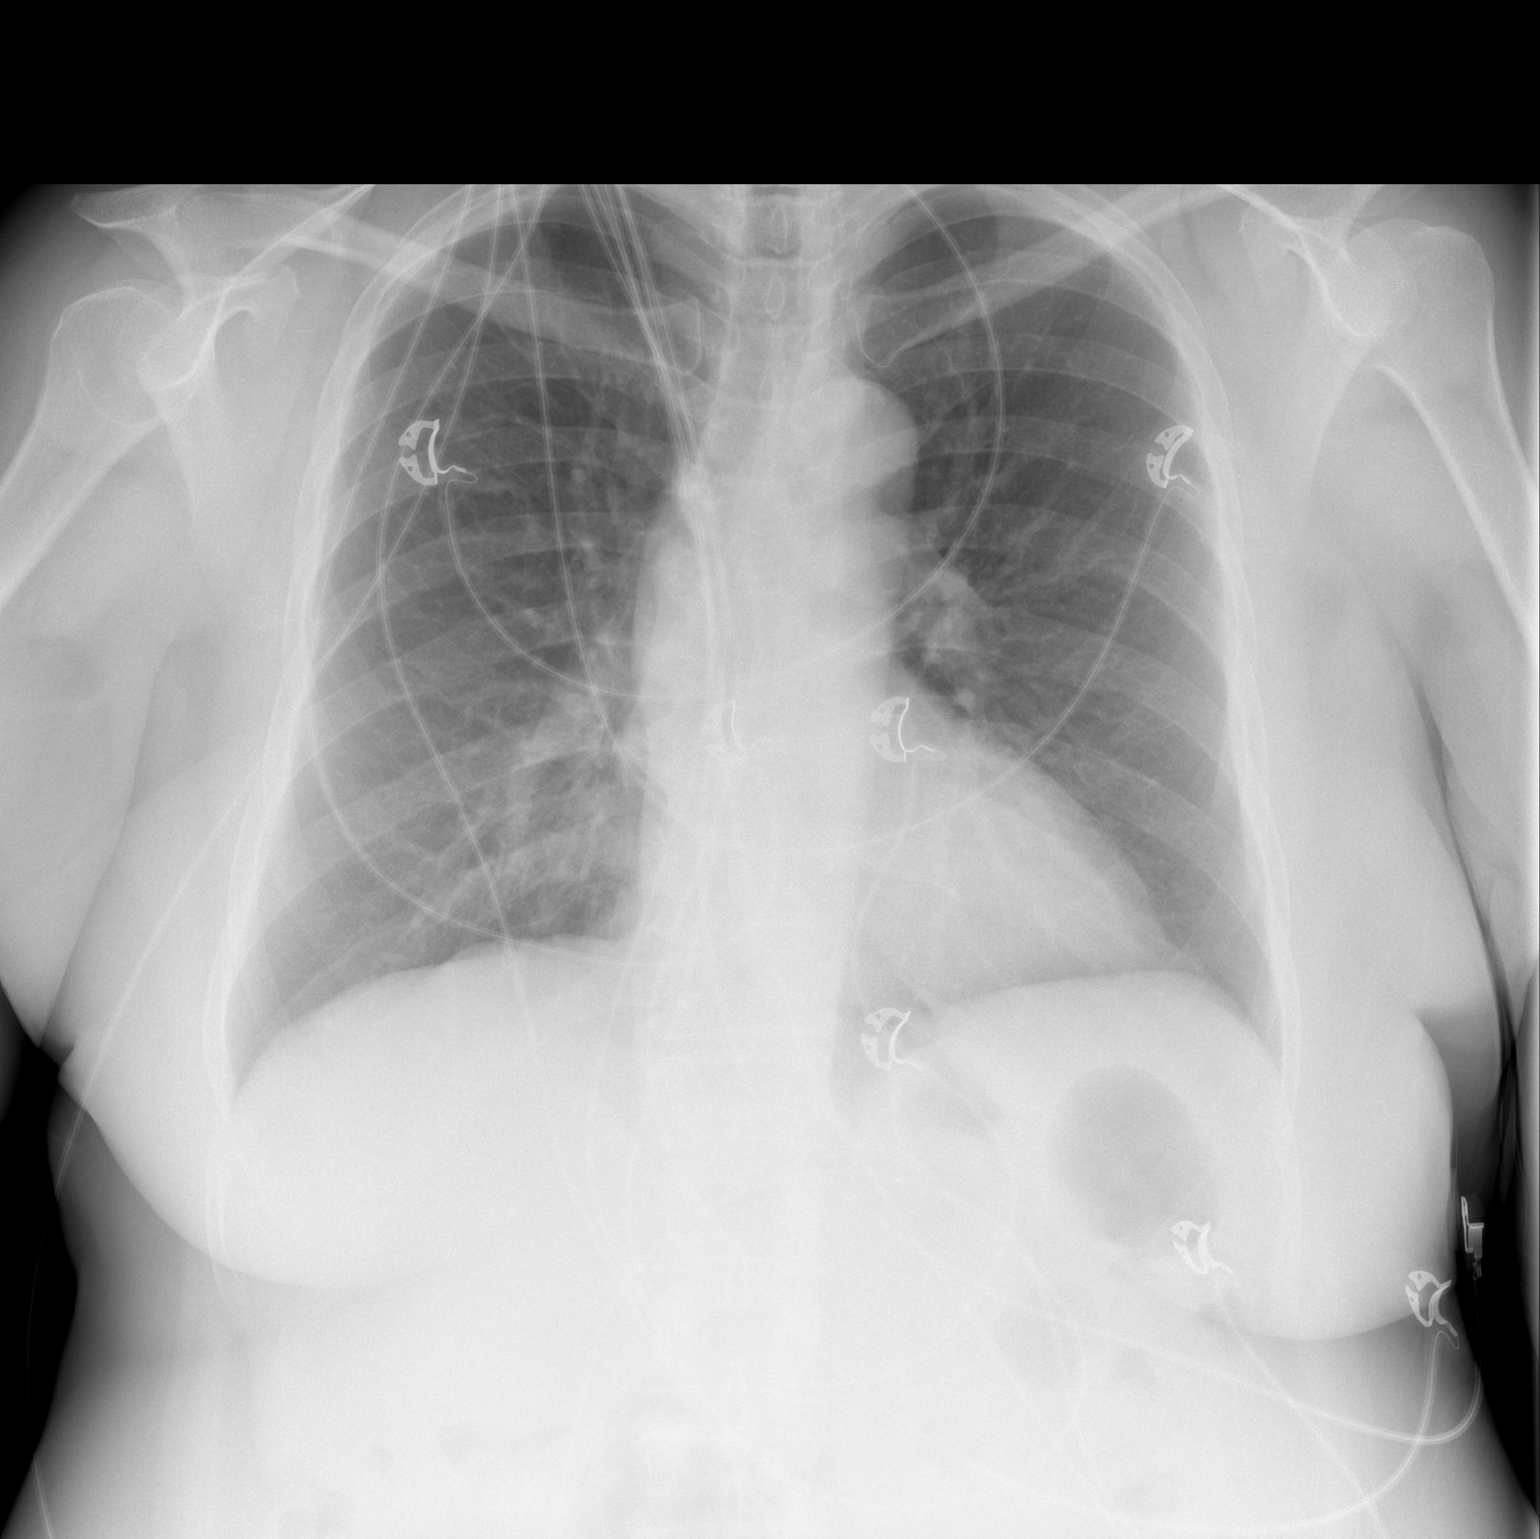

[w chest lat]
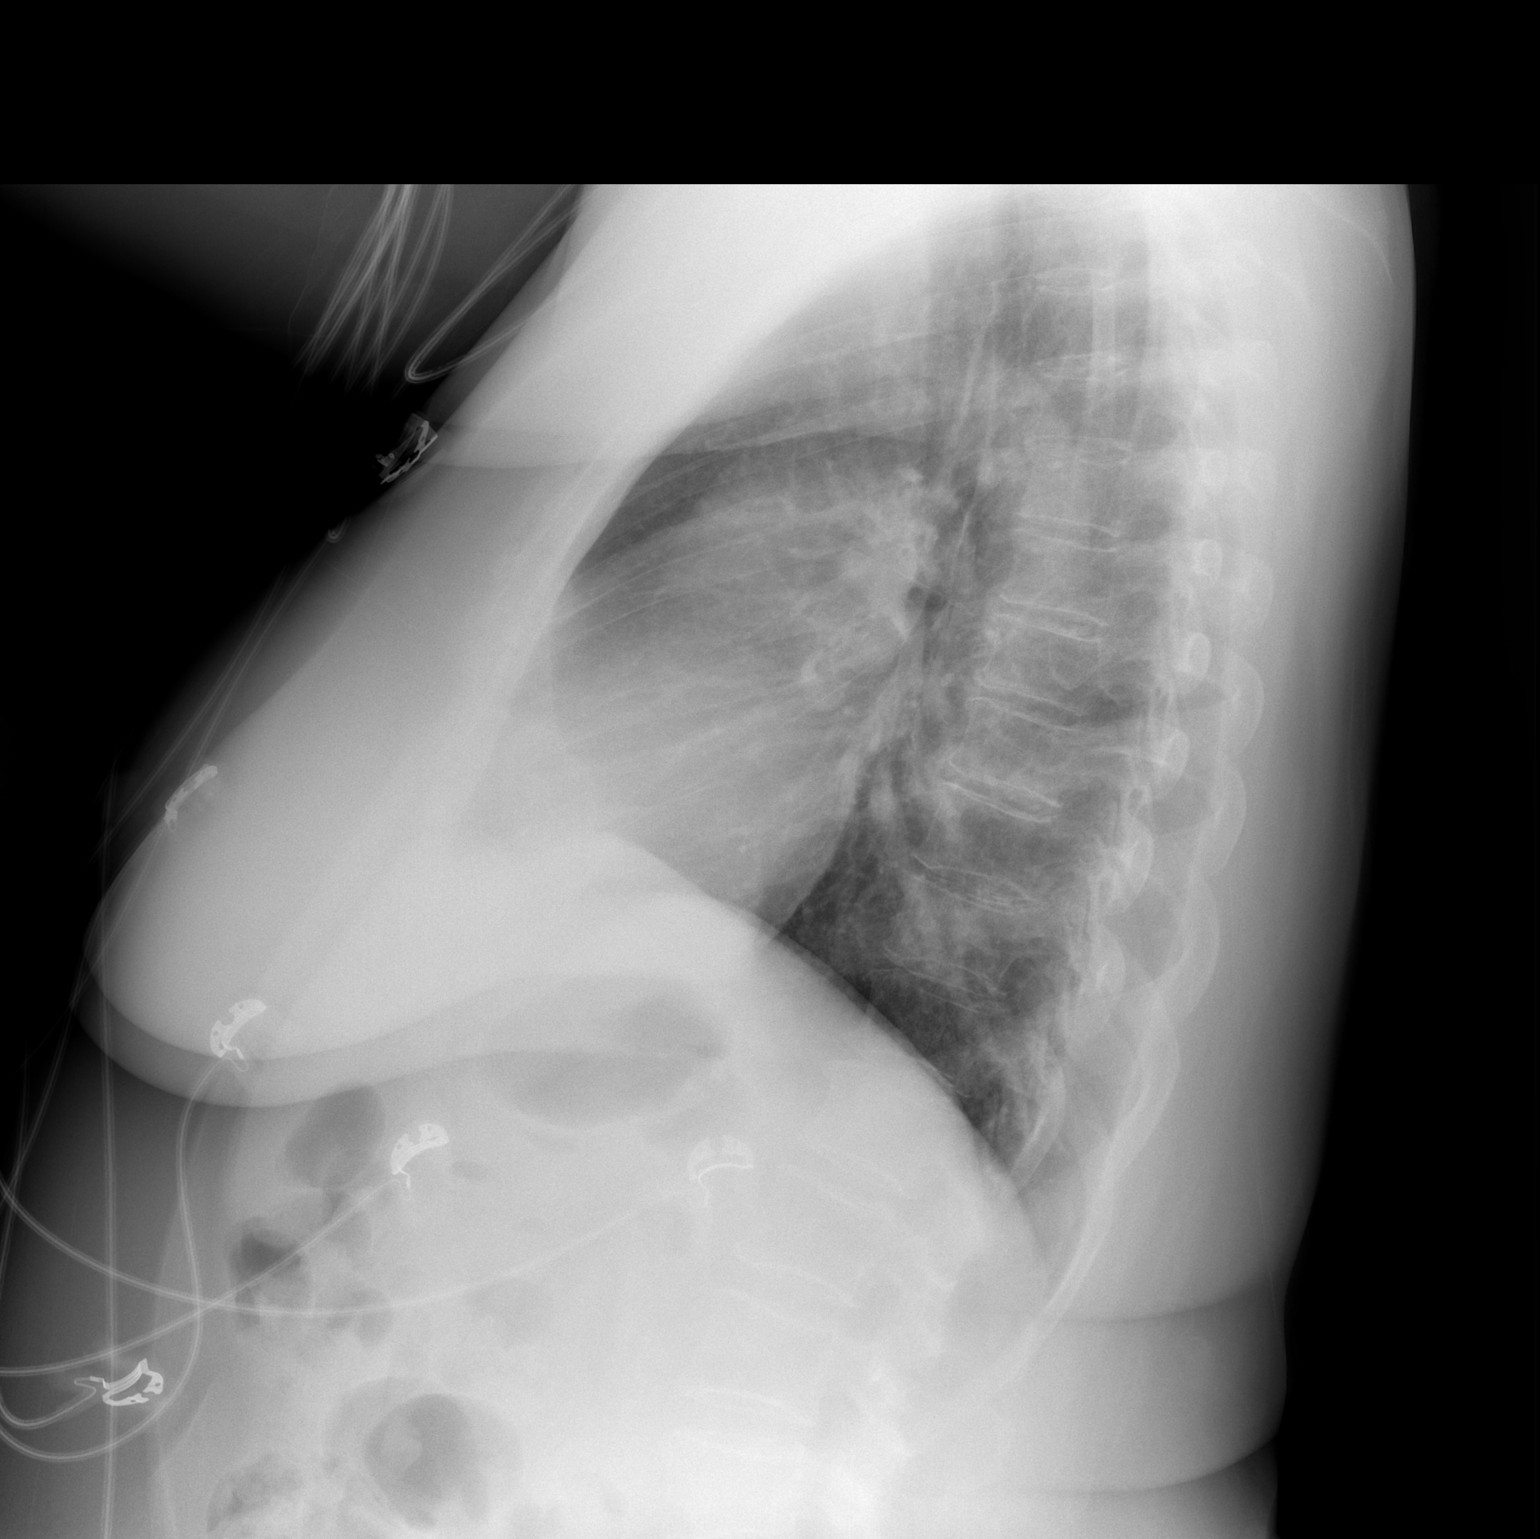

[2 of 2 positions shown; findings below may reference images not displayed]

FINDINGS: Normal heart size and mediastinal contours.

Artifact from EKG leads.

There is no edema, consolidation, effusion, or pneumothorax.
IMPRESSION: No active cardiopulmonary disease.

## 2021-02-20 DIAGNOSIS — E119 Type 2 diabetes mellitus without complications: Secondary | ICD-10-CM | POA: Diagnosis not present

## 2021-02-23 ENCOUNTER — Ambulatory Visit: Payer: Medicare Other | Admitting: Physical Therapy

## 2021-02-26 ENCOUNTER — Ambulatory Visit: Payer: Medicare Other | Admitting: Physical Therapy

## 2021-03-02 ENCOUNTER — Telehealth: Payer: Self-pay

## 2021-03-02 ENCOUNTER — Ambulatory Visit: Payer: Medicare Other

## 2021-03-02 ENCOUNTER — Encounter: Payer: Medicare Other | Admitting: Physical Therapy

## 2021-03-02 NOTE — Telephone Encounter (Signed)
No answer; left message for patient confirming that she is discharged from physical therapy due to two no-shows and multiple late cancellations. It was also stated that if she wishes to continue PT at [facility] in the future, she would need an additional referral and would have to be seen as a new patient. She was encouraged to call the clinic if she has any questions.

## 2021-03-05 ENCOUNTER — Telehealth: Payer: Self-pay | Admitting: Internal Medicine

## 2021-03-05 ENCOUNTER — Other Ambulatory Visit: Payer: Self-pay

## 2021-03-05 ENCOUNTER — Encounter: Payer: Medicare Other | Admitting: Physical Therapy

## 2021-03-05 MED ORDER — METFORMIN HCL 500 MG PO TABS: 500.0000 mg | ORAL_TABLET | Freq: Every day | ORAL | 1 refills | Status: DC

## 2021-03-05 NOTE — Telephone Encounter (Signed)
1.Medication Requested: metFORMIN (GLUCOPHAGE) 500 MG tablet 2. Pharmacy (Name, Gray): [facility] [place], [place] - [address] Phone:  [phone]  Fax:  [phone]     3. On Med List: Y  4. Last Visit with PCP: 02/06/2021  5. Next visit date with PCP: 07/22/2021   Agent: Please be advised that RX refills may take up to 3 business days. We ask that you follow-up with your pharmacy.

## 2021-03-05 NOTE — Telephone Encounter (Signed)
Faxed today

## 2021-03-20 NOTE — Progress Notes (Signed)
[place] [facility] [place] Phone: [phone] Subjective:   Erica [name], am serving as a scribe for Dr. [name].  This visit occurred during the SARS-CoV-2 public health emergency.  Safety protocols were in place, including screening questions prior to the visit, additional usage of staff PPE, and extensive cleaning of exam room while observing appropriate contact time as indicated for disinfecting solutions.   I'm seeing this patient by the request  of:  [name], MD  CC: Leg pain and back pain  [id number]  02/02/2021 Continues on chronic pain.  Could be secondary to lumbar radiculopathy.  Having done bone scan of the knee that did not show any significant loosening.  Discussed with patient about icing regimen.  We have ruled out such things as clots at this point.  Patient knows that if any worsening pain, shortness of breath to seek medical attention.  Follow-up with me again after 6 to 8 weeks with working with physical therapy as well as the laboratory work-up and the epidural.  Update 03/23/2021 Erica [name] is a 65 y.o. female coming in with complaint of B hip and LBP. Patient states that she ended physical therapy. Unsure if this helped her pain. Has pain with anything she does she states.   Patient has pain in R knee after doing PT. R TKE history.   Reviewed patient's physical therapy notes and patient has been a couple times but last note is from May 10. Patient has not undergone the epidural injection that was ordered.  Laboratory work-up included elevated alkaline phosphatase but is at patient's baseline and very mild elevation of sedimentation rate at 33 otherwise work-up was fairly unremarkable  Past Medical History:  Diagnosis Date   Abnormal stress test    a. 09/2016: NST showed a small defect of mild severity present in the basal inferoseptal and mid inferoseptal location, consistent with  ischemia. --> medically managed   ALLERGIC RHINITIS    Anxiety    Bursitis    DDD (degenerative disc disease), lumbar    ESI with [facility] (spring 2016)   Depression    Diabetes mellitus without complication (HCC)    Dyslipidemia    External hemorrhoids    GERD (gastroesophageal reflux disease)    Hypertension    Obesity    Osteoarthritis    Palpitations    a. prior event monitor showing sinus tachycardia, [name] PAF.    Panic attacks    Hx of depression   Sleep apnea    CPAP hs   Past Surgical History:  Procedure Laterality Date   ABDOMINAL HYSTERECTOMY     MASS EXCISION Left 12/30/2015   Procedure: EXCISION LEFT LEG MASS;  Surgeon: Donnie Mesa, MD;  Location: [facility];  Service: General;  Laterality: Left;   REPLACEMENT TOTAL KNEE Right    RIGHT OOPHORECTOMY     [name] cancer   Social History   Socioeconomic History   Marital status: Married    Spouse name: [name]   Number of children: 3   Years of education: Not on file   Highest education level: Not on file  Occupational History   Occupation: Disabled  Tobacco Use   Smoking status: Never   Smokeless tobacco: Never   Tobacco comments:    tried to as a teenager  Media planner   Vaping Use: Never used  Substance and Sexual Activity   Alcohol use: Yes    Alcohol/week: 1.0  standard drink    Types: 1 Glasses of wine per week    Comment: one drink a month   Drug use: [name]   Sexual activity: Not Currently  Other Topics Concern   Not on file  Social History Narrative   Married with children. Pt is on disablity. Previous worked in the school system   Social Determinants of Radio broadcast assistant Strain: Low Risk    Difficulty of Paying Living Expenses: Not hard at all  Food Insecurity: [name] Food Insecurity   Worried About Charity fundraiser in the Last Year: Never true   Arboriculturist in the Last Year: Never true  Transportation Needs: [name] Transportation Needs   Lack of Transportation (Medical): [name]    Lack of Transportation (Non-Medical): [name]  Physical Activity: Inactive   Days of Exercise per Week: 0 days   Minutes of Exercise per Session: 0 min  Stress: [name] Stress Concern Present   Feeling of Stress : Not at all  Social Connections: Socially Integrated   Frequency of Communication with Friends and Family: More than three times a week   Frequency of Social Gatherings with Friends and Family: Once a week   Attends Religious Services: More than 4 times per year   Active Member of [employer] or Organizations: [name]   Attends Music therapist: More than 4 times per year   Marital Status: Married   Allergies  Allergen Reactions   Seroquel [Quetiapine Fumarate]     Hallucinations, palpitations   Codeine Nausea And Vomiting   Guaifenesin-Codeine Other (See Comments)    REACTION: feels spacey   Wellbutrin [Bupropion] Palpitations    hallucinations   Family History  Problem Relation Age of Onset   Heart disease Mother        Enlarged Heart, Pacemaker   Diabetes Mother    Thyroid disease Mother    Hyperlipidemia Mother    Hypertension Mother    Sleep apnea Mother    Dementia Father    Kidney failure Father    Prostate cancer Father    Alcoholism Father    Asthma Other    Allergies Sister    Asthma Son    Breast cancer Maternal Aunt        cousin   Colon cancer Cousin    Heart disease Son        CHF, morbid obesity   Prostate cancer Maternal Uncle    Diabetes Son     Current Outpatient Medications (Endocrine & Metabolic):    metFORMIN (GLUCOPHAGE) 500 MG tablet, Take 1 tablet (500 mg total) by mouth daily with breakfast. 2 week supply until she receive mail order   Semaglutide,0.25 or 0.5MG /DOS, (OZEMPIC, 0.25 OR 0.5 MG/DOSE,) 2 MG/1.5ML SOPN, Inject 0.5 mg into the skin once a week.  Current Outpatient Medications (Cardiovascular):    atenolol (TENORMIN) 25 MG tablet, Take 25mg  (1 tablet) in the AM and 50mg  (2 tablets) in the PM   furosemide (LASIX) 40 MG tablet,  Take 1 tablet (40 mg total) by mouth 2 (two) times daily.   propranolol (INDERAL) 10 MG tablet, Take 1 tablet (10 mg total) by mouth 3 (three) times daily as needed (PALPITATIONS).   rosuvastatin (CRESTOR) 20 MG tablet, Take 1 tablet (20 mg total) by mouth daily.  Current Outpatient Medications (Respiratory):    albuterol (VENTOLIN HFA) 108 (90 Base) MCG/ACT inhaler, Inhale 2 puffs into the lungs every 6 (six) hours as needed for wheezing or shortness  of breath.   fluticasone (FLONASE) 50 MCG/ACT nasal spray, Place 2 sprays into both nostrils daily.   levocetirizine (XYZAL) 5 MG tablet, Take 1 tablet (5 mg total) by mouth every evening.  Current Outpatient Medications (Analgesics):    SUMAtriptan-naproxen (TREXIMET) 85-500 MG tablet, Take 1 pill as needed for migraine, if migraine persists may repeat x1 after 2 hours   Current Outpatient Medications (Other):    AMBULATORY NON FORMULARY MEDICATION, 1 Units by Other route See admin instructions.   ALPRAZolam (XANAX) 1 MG tablet, Take 1 mg by mouth as needed for anxiety.   AMBULATORY NON FORMULARY MEDICATION, 4 Prong cane   blood glucose meter kit and supplies KIT, Dispense based on patient and insurance preference. Use up to four times daily as directed. (FOR E11.9).   hydrOXYzine (ATARAX/VISTARIL) 10 MG tablet, Take 1 tablet (10 mg total) by mouth 3 (three) times daily as needed for itching.   linaclotide (LINZESS) 290 MCG CAPS capsule, Take 1 capsule (290 mcg total) by mouth daily before breakfast. 2 week supply until she receive mail order   Multiple Vitamins-Minerals (MULTIVITAMIN WITH MINERALS) tablet, Take 1 tablet by mouth daily.   omeprazole (PRILOSEC) 20 MG capsule, Take 1 capsule (20 mg total) by mouth daily. 2 week supply until she receive mail order   ondansetron (ZOFRAN) 4 MG tablet, Take 1 tablet (4 mg total) by mouth every 8 (eight) hours as needed for nausea or vomiting.   potassium chloride SA (KLOR-CON) 20 MEQ tablet, Take 1  tablet (20 mEq total) by mouth 2 (two) times daily. 2 week supply until she receive mail order (Patient taking differently: Take 20 mEq by mouth daily. 1 tablet by mouth daily)   tiZANidine (ZANAFLEX) 4 MG tablet, TAKE 1 TABLET BY MOUTH AT BEDTIME   venlafaxine XR (EFFEXOR-XR) 150 MG 24 hr capsule, Take 1 capsule (150 mg total) by mouth 2 (two) times daily.   Vitamin D, Ergocalciferol, (DRISDOL) 1.25 MG (50000 UNIT) CAPS capsule, Take 1 capsule (50,000 Units total) by mouth every 7 (seven) days.   Reviewed prior external information including notes and imaging from  primary care provider As well as notes that were available from care everywhere and other healthcare systems.  Past medical history, social, surgical and family history all reviewed in electronic medical record.  [name] pertanent information unless stated regarding to the chief complaint.   Review of Systems:  [name] headache, visual changes, nausea, vomiting, diarrhea, constipation, dizziness, abdominal pain, skin rash, fevers, chills, night sweats, weight loss, swollen lymph nodes, body aches, joint swelling, chest pain, shortness of breath, mood changes. POSITIVE muscle aches  Objective  Blood pressure 122/72, pulse 73, height 5\' 6"  (1.676 m), weight 250 lb (113.4 kg), SpO2 96 %.   General: [name] apparent distress alert and oriented x3 mood and affect normal, dressed appropriately.  HEENT: Pupils equal, extraocular movements intact  Respiratory: Patient's speak in full sentences and does not appear short of breath  Cardiovascular: Trace lower extremity edema, non tender, [name] erythema  Antalgic gait.  [name] pain in the popliteal area somewhat.  Patient does not have any true masses appreciated though. Patient does have tenderness to palpation in the back itself.  Patient does have pain in the lower extremity as well of the right leg.  Even to light palpation.  Patient is neurovascular intact distally.  Good capillary refill noted.  Patient does  have mild decrease in the posterior tibialis pulses bilaterally     Impression and Recommendations:  The above documentation has been reviewed and is accurate and complete Lyndal Pulley, DO

## 2021-03-23 ENCOUNTER — Other Ambulatory Visit: Payer: Self-pay

## 2021-03-23 ENCOUNTER — Encounter: Payer: Self-pay | Admitting: Family Medicine

## 2021-03-23 ENCOUNTER — Ambulatory Visit (INDEPENDENT_AMBULATORY_CARE_PROVIDER_SITE_OTHER): Payer: Medicare Other | Admitting: Family Medicine

## 2021-03-23 VITALS — BP 122/72 | HR 73 | Ht 66.0 in | Wt 250.0 lb

## 2021-03-23 DIAGNOSIS — M25562 Pain in left knee: Secondary | ICD-10-CM | POA: Diagnosis not present

## 2021-03-23 DIAGNOSIS — Z96651 Presence of right artificial knee joint: Secondary | ICD-10-CM | POA: Diagnosis not present

## 2021-03-23 DIAGNOSIS — M25561 Pain in right knee: Secondary | ICD-10-CM

## 2021-03-23 DIAGNOSIS — M5136 Other intervertebral disc degeneration, lumbar region: Secondary | ICD-10-CM

## 2021-03-23 DIAGNOSIS — M545 Low back pain, unspecified: Secondary | ICD-10-CM

## 2021-03-23 DIAGNOSIS — G8929 Other chronic pain: Secondary | ICD-10-CM

## 2021-03-23 DIAGNOSIS — R202 Paresthesia of skin: Secondary | ICD-10-CM

## 2021-03-23 MED ORDER — AMBULATORY NON FORMULARY MEDICATION: 1.0000 [IU] | Status: DC

## 2021-03-23 MED ORDER — TIZANIDINE HCL 4 MG PO TABS: ORAL_TABLET | ORAL | 0 refills | Status: DC

## 2021-03-23 NOTE — Patient Instructions (Addendum)
[facility] [phone]-call to schedule epidural  Blood flow test: [facility]  ([facility] [employer] in [facility]) [address], [address] [place], [place] [address] [phone]  [facility] for nerve conduction study [address], [place], [place] [address]  [facility] [address], [place] [address] [place] [address] [place], [place] [address] [phone]   See me 4 weeks after epidural

## 2021-03-23 NOTE — Assessment & Plan Note (Signed)
Patient has had back pain previously.  Has responded well to epidural.  Concern for some of the lumbar radiculopathy that could be contributing to some of the right lower extremity pain.  We discussed following through with the epidural.  Patient will consider it.  In addition to this we will get the ABI we have discussed secondary to the carotid stenosis.  Same plan otherwise. Have ruled out DVT previously and still same pain with no swelling

## 2021-03-23 NOTE — Assessment & Plan Note (Signed)
Continues to have pain in the leg but patient has had testing that did not show any significant loosening previously.  At this point we will get an ABI with patient having a history of carotid artery stenosis.  Patient has not had any deep venous thrombosis over the course of time.  Discussed with patient to continue the home exercises.  We do have patient getting a nerve conduction study in the next days.  In addition to this encouraged her to consider the epidural in her back to see if this is playing a role as well.  Patient will follow up with me 4 weeks after the epidural for further evaluation.  Patient knows if any worsening pain to seek medical attention immediately

## 2021-03-25 ENCOUNTER — Other Ambulatory Visit: Payer: Self-pay

## 2021-03-25 ENCOUNTER — Ambulatory Visit (INDEPENDENT_AMBULATORY_CARE_PROVIDER_SITE_OTHER): Payer: Medicare Other | Admitting: Neurology

## 2021-03-25 DIAGNOSIS — R202 Paresthesia of skin: Secondary | ICD-10-CM

## 2021-03-25 DIAGNOSIS — M79604 Pain in right leg: Secondary | ICD-10-CM

## 2021-03-25 NOTE — Procedures (Signed)
[facility]  [place], [place]  [place], [place] [address] Tel: [phone] Fax:  [phone] Test Date:  03/25/2021  Patient: Erica [name] DOB: 10/18/1955 Physician: Narda Amber, DO  Sex: Female Height: 5\' 6"  Ref Phys: [name], DO  ID#: [id number]   Technician:    Patient Complaints: This is a 65 year old female referred for evaluation of bilateral leg pain.  NCV & EMG Findings: Electrodiagnostic testing of the right lower extremity and additional studies of the left shows: Bilateral sural and superficial peroneal sensory responses are within normal limits. Bilateral peroneal and tibial motor responses are within normal limits. Bilateral tibial H reflex studies are within normal limits. There is no evidence of active or chronic motor axonal changes affecting any of the tested muscles.  Motor unit configuration and recruitment pattern is within normal limits.  Impression: This is a normal study of the lower extremities.  In particular, there is no evidence of a sensorimotor polyneuropathy or lumbosacral radiculopathy.  ___________________________ Narda Amber, DO    Nerve Conduction Studies Anti Sensory Summary Table   Stim Site NR Peak (ms) Norm Peak (ms) P-T Amp (V) Norm P-T Amp  Left Sup Peroneal Anti Sensory (Ant Lat Mall)  33C  12 cm    2.3 <4.6 14.5 >3  Right Sup Peroneal Anti Sensory (Ant Lat Mall)  33C  12 cm    4.4 <4.6 10.2 >3  Left Sural Anti Sensory (Lat Mall)  33C  Calf    3.1 <4.6 9.3 >3  Right Sural Anti Sensory (Lat Mall)  33C  Calf    3.3 <4.6 11.2 >3   Motor Summary Table   Stim Site NR Onset (ms) Norm Onset (ms) O-P Amp (mV) Norm O-P Amp Site1 Site2 Delta-0 (ms) Dist (cm) Vel (m/s) Norm Vel (m/s)  Left Peroneal Motor (Ext Dig Brev)  33C  Ankle    2.7 <6.0 3.5 >2.5 B Fib Ankle 9.0 41.0 46 >40  B Fib    11.7  3.4  Poplt B Fib 1.4 8.0 57 >40  Poplt    13.1  3.3         Right Peroneal Motor (Ext Dig Brev)  33C   Ankle    2.7 <6.0 3.8 >2.5 B Fib Ankle 9.6 43.0 45 >40  B Fib    12.3  3.2  Poplt B Fib 1.5 8.0 53 >40  Poplt    13.8  3.0         Left Tibial Motor (Abd Hall Brev)  33C  Ankle    5.2 <6.0 5.2 >4 Knee Ankle 8.9 42.0 47 >40  Knee    14.1  2.8         Right Tibial Motor (Abd Hall Brev)  33C  Ankle    4.7 <6.0 4.3 >4 Knee Ankle 10.2 44.0 43 >40  Knee    14.9  3.3          H Reflex Studies   NR H-Lat (ms) Lat Norm (ms) L-R H-Lat (ms)  Left Tibial (Gastroc)  33C     32.11 <35 0.00  Right Tibial (Gastroc)  33C     32.11 <35 0.00   EMG   Side Muscle Ins Act Fibs Psw Fasc Number Recrt Dur Dur. Amp Amp. Poly Poly. Comment  Right AntTibialis Nml Nml Nml Nml Nml Nml Nml Nml Nml Nml Nml Nml N/A  Right Gastroc Nml Nml Nml Nml Nml Nml Nml Nml Nml Nml Nml Nml N/A  Right Flex Dig Long Nml Nml Nml Nml Nml Nml Nml Nml Nml Nml Nml Nml N/A  Right RectFemoris Nml Nml Nml Nml Nml Nml Nml Nml Nml Nml Nml Nml N/A  Right GluteusMed Nml Nml Nml Nml Nml Nml Nml Nml Nml Nml Nml Nml N/A  Left AntTibialis Nml Nml Nml Nml Nml Nml Nml Nml Nml Nml Nml Nml N/A  Left Gastroc Nml Nml Nml Nml Nml Nml Nml Nml Nml Nml Nml Nml N/A  Left Flex Dig Long Nml Nml Nml Nml Nml Nml Nml Nml Nml Nml Nml Nml N/A  Left RectFemoris Nml Nml Nml Nml Nml Nml Nml Nml Nml Nml Nml Nml N/A  Left GluteusMed Nml Nml Nml Nml Nml Nml Nml Nml Nml Nml Nml Nml N/A      Waveforms:

## 2021-04-02 ENCOUNTER — Ambulatory Visit
Admission: RE | Admit: 2021-04-02 | Discharge: 2021-04-02 | Disposition: A | Discharge status: Home / Self Care | Payer: Medicare Other | Source: Ambulatory Visit | Attending: Family Medicine | Admitting: Family Medicine

## 2021-04-02 ENCOUNTER — Other Ambulatory Visit: Payer: Self-pay

## 2021-04-02 DIAGNOSIS — M47817 Spondylosis without myelopathy or radiculopathy, lumbosacral region: Secondary | ICD-10-CM | POA: Diagnosis not present

## 2021-04-02 DIAGNOSIS — G8929 Other chronic pain: Secondary | ICD-10-CM

## 2021-04-02 MED ORDER — METHYLPREDNISOLONE ACETATE 40 MG/ML INJ SUSP (RADIOLOG
80.0000 mg | Freq: Once | INTRAMUSCULAR | Status: AC
  Administered 2021-04-02: 80 mg via EPIDURAL

## 2021-04-02 MED ORDER — IOPAMIDOL (ISOVUE-M 200) INJECTION 41%
1.0000 mL | Freq: Once | INTRAMUSCULAR | Status: AC
  Administered 2021-04-02: 1 mL via EPIDURAL

## 2021-04-02 NOTE — Discharge Instructions (Signed)

## 2021-04-07 ENCOUNTER — Other Ambulatory Visit: Payer: Self-pay | Admitting: Family Medicine

## 2021-04-07 DIAGNOSIS — M25561 Pain in right knee: Secondary | ICD-10-CM

## 2021-04-10 DIAGNOSIS — Z03818 Encounter for observation for suspected exposure to other biological agents ruled out: Secondary | ICD-10-CM | POA: Diagnosis not present

## 2021-04-15 ENCOUNTER — Other Ambulatory Visit: Payer: Self-pay

## 2021-04-15 ENCOUNTER — Ambulatory Visit (HOSPITAL_COMMUNITY)
Admission: RE | Admit: 2021-04-15 | Discharge: 2021-04-15 | Disposition: A | Discharge status: Home / Self Care | Payer: Medicare Other | Source: Ambulatory Visit | Attending: Internal Medicine | Admitting: Internal Medicine

## 2021-04-15 DIAGNOSIS — M25562 Pain in left knee: Secondary | ICD-10-CM | POA: Insufficient documentation

## 2021-04-15 DIAGNOSIS — M25561 Pain in right knee: Secondary | ICD-10-CM | POA: Diagnosis not present

## 2021-04-15 DIAGNOSIS — H2513 Age-related nuclear cataract, bilateral: Secondary | ICD-10-CM | POA: Diagnosis not present

## 2021-04-15 DIAGNOSIS — E119 Type 2 diabetes mellitus without complications: Secondary | ICD-10-CM | POA: Diagnosis not present

## 2021-04-15 DIAGNOSIS — M79604 Pain in right leg: Secondary | ICD-10-CM

## 2021-04-15 DIAGNOSIS — H40013 Open angle with borderline findings, low risk, bilateral: Secondary | ICD-10-CM | POA: Diagnosis not present

## 2021-04-15 DIAGNOSIS — M79605 Pain in left leg: Secondary | ICD-10-CM | POA: Diagnosis not present

## 2021-04-15 DIAGNOSIS — H43811 Vitreous degeneration, right eye: Secondary | ICD-10-CM | POA: Diagnosis not present

## 2021-04-15 LAB — HM DIABETES EYE EXAM

## 2021-04-23 ENCOUNTER — Encounter: Payer: Self-pay | Admitting: Internal Medicine

## 2021-04-23 NOTE — Progress Notes (Unsigned)
Outside notes received. Information abstracted. Notes sent to scan.  

## 2021-04-28 ENCOUNTER — Other Ambulatory Visit: Payer: Self-pay | Admitting: Internal Medicine

## 2021-04-28 ENCOUNTER — Other Ambulatory Visit: Payer: Self-pay | Admitting: Family Medicine

## 2021-05-05 ENCOUNTER — Telehealth: Payer: Self-pay

## 2021-05-05 ENCOUNTER — Other Ambulatory Visit: Payer: Self-pay

## 2021-05-05 MED ORDER — ATENOLOL 25 MG PO TABS: ORAL_TABLET | ORAL | 0 refills | Status: DC

## 2021-05-05 MED ORDER — VITAMIN D (ERGOCALCIFEROL) 1.25 MG (50000 UNIT) PO CAPS: ORAL_CAPSULE | ORAL | 0 refills | Status: DC

## 2021-05-05 NOTE — Telephone Encounter (Signed)
Sent in today for patient. 

## 2021-05-12 ENCOUNTER — Other Ambulatory Visit: Payer: Self-pay | Admitting: Internal Medicine

## 2021-05-28 ENCOUNTER — Other Ambulatory Visit: Payer: Self-pay

## 2021-05-28 ENCOUNTER — Ambulatory Visit (INDEPENDENT_AMBULATORY_CARE_PROVIDER_SITE_OTHER): Payer: Medicare Other | Admitting: Pharmacist

## 2021-05-28 DIAGNOSIS — F32A Depression, unspecified: Secondary | ICD-10-CM

## 2021-05-28 DIAGNOSIS — F419 Anxiety disorder, unspecified: Secondary | ICD-10-CM

## 2021-05-28 DIAGNOSIS — E7849 Other hyperlipidemia: Secondary | ICD-10-CM | POA: Diagnosis not present

## 2021-05-28 DIAGNOSIS — E119 Type 2 diabetes mellitus without complications: Secondary | ICD-10-CM | POA: Diagnosis not present

## 2021-05-28 DIAGNOSIS — I10 Essential (primary) hypertension: Secondary | ICD-10-CM | POA: Diagnosis not present

## 2021-05-28 DIAGNOSIS — J301 Allergic rhinitis due to pollen: Secondary | ICD-10-CM

## 2021-05-28 DIAGNOSIS — K5909 Other constipation: Secondary | ICD-10-CM

## 2021-05-28 NOTE — Patient Instructions (Signed)
Visit Information  Phone number for Pharmacist: [phone]   Goals Addressed             This Visit's Progress    Manage My Medicine       Timeframe:  Long-Range Goal Priority:  Medium Start Date:       01/22/21                      Expected End Date:   08/09/21                    Follow Up Date 05/09/21   - call for medicine refill 2 or 3 days before it runs out - call if I am sick and can't take my medicine - keep a list of all the medicines I take; vitamins and herbals too  -Make appt with cardiologist regarding palpitations -Start Miralax once a day for constipation - may increase to 2-3 times daily if needed    Why is this important?   These steps will help you keep on track with your medicines.   Notes:         Care Plan : CCM Pharmacy Care Plan  Updates made by Charlton Haws, RPH since 05/28/2021 12:00 AM     Problem: Hypertension, Hyperlipidemia, Diabetes, Depression, Anxiety, and Allergic Rhinitis   Priority: High     Long-Range Goal: Disease management   Start Date: 01/22/2021  Expected End Date: 07/24/2021  This Visit's Progress: On track  Recent Progress: On track  Priority: High  Note:   Current Barriers:  Unable to independently monitor therapeutic efficacy  Pharmacist Clinical Goal(s):  Patient will achieve adherence to monitoring guidelines and medication adherence to achieve therapeutic efficacy through collaboration with PharmD and provider.   Interventions: 1:1 collaboration with [name], MD regarding development and update of comprehensive plan of care as evidenced by provider attestation and co-signature Inter-disciplinary care team collaboration (see longitudinal plan of care) Comprehensive medication review performed; medication list updated in electronic medical record  Hypertension / Palpitations (BP goal <130/80) -Not ideally controlled - home BP at goal per patient; she reports increased frequency of palpitations,  sometimes wakes her up at night; she uses propranolol for this but does not like to due to causing fatigue -Current treatment: Atenolol 25 mg AM and 50 mg PM  Furosemide 40 mg BID  Propranolol 10 mg TID prn (palpitations) -Current home readings: 140/89, HR 74 -Advised to f/u with cardiologist re: palpitations   Hyperlipidemia: (LDL goal < 70) -Controlled - LDL at goal after switching simvastatin to rosuvastatin  -Current treatment: Rosuvastatin 20 mg daily -Educated on Cholesterol goals; Benefits of statin for ASCVD risk reduction; -Recommended to continue current medication  Diabetes (A1c goal <7%) -Controlled - A1c at goal, pt is tolerating ozempic -Current medications: Metformin 500 mg daily Ozempic 0.25 mg weekly -Current home glucose readings fasting glucose: 100-120 -Current meal patterns: seen nutritionist -Current exercise: walking around neighborhood; planning to restart at aquatic center -Recommended to continue current medication  Depression/Anxiety (Goal: manage symptoms)  -Not ideally controlled - pt reports increased stress, possibly related to recent increase in palpitations; she reports compliance with medications as prescribed -pt reports hydroxyzine helps with allergies too -Current treatment: Venlafaxine XR 150 mg BID Alprazolam 1 mg PRN - QD-BID Hydroxyzine 10 mg TID - HS -PHQ9: 1 (12/2020) -GAD7: 12 (09/2019) -Connected with psyhiatrist for mental health support -Recommended to continue current medication  Constipation (Goal: manage symptoms) -Not  ideally controlled - pt reports docusate did not work for her; she still endorses constipation; she has not tried Miralax -Current treatment  Linzess 290 mcg  OTC docusate -Counseled on importance of hydration and fiber -Start Miralax once daily; may titrate up to TID if needed  Allergic rhinitis (Goal: manage symptoms) -Not ideally controlled - Pt reports sinus issues are worse, she is still taking below  medications -Current treatment  Fluticasone nasal spray BID Levocetirizine 5 mg HS Albuterol HFA prn -Recommended to continue current medication ntinue current medication  Health Maintenance -Vaccine gaps: none -pt reports she did start taking B12 and B6 supplements as directed by sports medicine and PCP  Patient Goals/Self-Care Activities Patient will:  - take medications as prescribed -focus on medication adherence by pill box -check glucose daily, document, and provide at future appointments -check blood pressure daily, document, and provide at future appointments -Make appt with cardiologist regarding palpitations -Start Miralax once a day for constipation - may increase to 2-3 times daily if needed      Patient verbalizes understanding of instructions provided today and agrees to view in [place].  Telephone follow up appointment with pharmacy team member scheduled for: 6 months  Charlene Brooke, PharmD, [place], CPP Clinical Pharmacist [facility] [phone]

## 2021-05-28 NOTE — Progress Notes (Signed)
Chronic Care Management Pharmacy Note  05/28/2021 Name:  LORRAIN RIVERS MRN:  [id number] DOB:  10-23-1955  Summary: -Pt reports increased palpitations, sometimes waking her up at night.  -Pt reports constipation is unchanged with Linzess and stool softener  Recommendations/Changes made from today's visit: -Advised pt to f/u with cardiologist to discuss palpitations -Start Miralax once daily, may titrate up to TID if needed   Subjective: JADAMARIE BUTSON is an 65 y.o. year old female who is a primary patient of Burns, Claudina Lick, MD.  The CCM team was consulted for assistance with disease management and care coordination needs.    Engaged with patient by telephone for follow up visit in response to provider referral for pharmacy case management and/or care coordination services.   Consent to Services:  The patient was given information about Chronic Care Management services, agreed to services, and gave verbal consent prior to initiation of services.  Please see initial visit note for detailed documentation.   Patient Care Team: [name], MD as PCP - General (Internal Medicine) Lafayette Dragon, MD (Inactive) as Consulting Physician (Gastroenterology) Azucena Fallen, MD as Consulting Physician (Obstetrics and Gynecology) Suella Broad, MD as Consulting Physician (Physical Medicine and Rehabilitation) Gaynelle Arabian, MD as Consulting Physician (Orthopedic Surgery) Chesley Mires, MD (Pulmonary Disease) Charlton Haws, [facility] as Pharmacist (Pharmacist)   Patient lives at home with husband. She is retired and has avoided public places during the pandemic. She is thinking about going back to the aquatic center for exercise soon.  Recent office visits: 02/06/21 Dr Quay Burow OV: start B12 1000 mcg and B6 100 mg.  12/08/20 Dr Quay Burow OV: CPE. Referred to dietician. Rx'd Ozempic. Discussed OTC stool softeners in addition to Linzess.  Recent consult visits: 02/02/21 Dr Tamala Julian (sports  med): f/u back pain. Injection given. Low B12, advised OTC B12 and B6.  01/01/21 RD Tyrone Nine (nutrition): DM counseling 12/24/20 Dr Tamala Julian (sports medicine): steroid injection 12/19/20 Dr [name] (pulmonary): f/u OSA 09/19/20 Dr Percival Spanish (cardiology): f/u abnormal stress test. Rx'd propranolol PRN for palpitations. Changed simvastatin to rosuvastatin 20 to achieve LDL < 70  Hospital visits: None in previous 6 months  Objective:  Lab Results  Component Value Date   CREATININE 0.81 02/02/2021   BUN 15 02/02/2021   GFR 75.24 02/02/2021   GFRNONAA 80 06/02/2020   GFRAA 93 06/02/2020   NA 141 02/02/2021   K 3.5 02/02/2021   CALCIUM 9.8 02/02/2021   CALCIUM 10.0 02/02/2021   CO2 33 (H) 02/02/2021   GLUCOSE 115 (H) 02/02/2021    Lab Results  Component Value Date/Time   HGBA1C 6.6 (H) 12/08/2020 11:56 AM   HGBA1C 6.3 (H) 06/02/2020 10:13 AM   GFR 75.24 02/02/2021 11:20 AM   GFR 80.04 12/08/2020 11:56 AM   MICROALBUR 1.2 12/08/2020 11:56 AM   MICROALBUR 0.5 06/02/2020 10:13 AM    Last diabetic Eye exam:  Lab Results  Component Value Date/Time   HMDIABEYEEXA No Retinopathy 04/15/2021 09:24 AM    Last diabetic Foot exam: No results found for: HMDIABFOOTEX   Lab Results  Component Value Date   CHOL 139 12/08/2020   HDL 50.40 12/08/2020   LDLCALC 63 12/08/2020   TRIG 126.0 12/08/2020   CHOLHDL 3 12/08/2020    Hepatic Function Latest Ref Rng & Units 02/02/2021 02/02/2021 12/08/2020  Total Protein 6.1 - 8.1 g/dL 7.0 7.5 7.5  Albumin 3.5 - 5.2 g/dL - 3.8 4.0  AST 0 - 37 U/L - 14 17  ALT  0 - 35 U/L - 11 14  Alk Phosphatase 39 - 117 U/L - 150(H) 139(H)  Total Bilirubin 0.2 - 1.2 mg/dL - 0.3 0.3  Bilirubin, Direct 0.0 - 0.3 mg/dL - - -    Lab Results  Component Value Date/Time   TSH 2.26 02/02/2021 11:20 AM   TSH 2.717 05/11/2019 09:19 AM   TSH 2.020 02/09/2018 10:53 AM   FREET4 1.06 02/09/2018 10:53 AM    CBC Latest Ref Rng & Units 02/02/2021 08/23/2019 05/11/2019  WBC 4.0 -  10.5 K/uL 7.8 7.4 7.2  Hemoglobin 12.0 - 15.0 g/dL 12.1 12.2 13.0  Hematocrit 36.0 - 46.0 % 36.7 37.2 40.8  Platelets 150.0 - 400.0 K/uL 287.0 208.0 272    Lab Results  Component Value Date/Time   VD25OH 64.84 02/02/2021 11:20 AM   VD25OH 46.0 07/05/2018 12:00 PM   VD25OH 23.3 (L) 02/09/2018 10:53 AM    Clinical ASCVD: No  The 10-year ASCVD risk score Mikey Bussing DC Jr., et al., 2013) is: 16.6%   Values used to calculate the score:     Age: 65 years     Sex: Female     Is Non-Hispanic African American: Yes     Diabetic: Yes     Tobacco smoker: No     Systolic Blood Pressure: 096 mmHg     Is BP treated: Yes     HDL Cholesterol: 50.4 mg/dL     Total Cholesterol: 139 mg/dL    Depression screen [facility] 2/9 02/06/2021 01/01/2021 12/08/2020  Decreased Interest 0 0 1  Down, Depressed, Hopeless 0 1 1  PHQ - 2 Score 0 1 2  Altered sleeping - - 2  Tired, decreased energy - - 2  Change in appetite - - 2  Feeling bad or failure about yourself  - - 3  Trouble concentrating - - 2  Moving slowly or fidgety/restless - - 0  Suicidal thoughts - - 0  PHQ-9 Score - - 13  Difficult doing work/chores - - Somewhat difficult  Some recent data might be hidden    GAD 7 : Generalized Anxiety Score 09/27/2019 09/14/2019 07/05/2018 06/07/2018  Nervous, Anxious, on Edge _0 Control/stop worrying _1 Worry too much - different things _2 Trouble relaxing _3 Restless _4 0  Easily annoyed or irritable 0 2 0 1  Afraid - awful might happen 1 1 0 1  Total GAD 7 Score _5 Anxiety Difficulty Not difficult at all - Not difficult at all Somewhat difficult    Social History   Tobacco Use  Smoking Status Never  Smokeless Tobacco Never  Tobacco Comments   tried to as a teenager   BP Readings from Last 3 Encounters:  04/02/21 127/78  03/23/21 122/72  02/06/21 120/80   Pulse Readings from Last 3 Encounters:  04/02/21 63  03/23/21 73  02/06/21 70   Wt Readings from Last 3  Encounters:  03/23/21 250 lb (113.4 kg)  02/06/21 250 lb (113.4 kg)  02/06/21 250 lb (113.4 kg)   BMI Readings from Last 3 Encounters:  03/23/21 40.35 kg/m  02/06/21 40.35 kg/m  02/06/21 40.35 kg/m    Assessment/Interventions: Review of patient past medical history, allergies, medications, health status, including review of consultants reports, laboratory and other test data, was performed as part of comprehensive evaluation and provision of chronic care management services.   SDOH:  (Social Determinants  of Health) assessments and interventions performed: Yes   SDOH Screenings   Alcohol Screen: Low Risk    Last Alcohol Screening Score (AUDIT): 2  Depression (PHQ2-9): Low Risk    PHQ-2 Score: 0  Financial Resource Strain: Low Risk    Difficulty of Paying Living Expenses: Not hard at all  Food Insecurity: No Food Insecurity   Worried About Charity fundraiser in the Last Year: Never true   Ran Out of Food in the Last Year: Never true  Housing: Low Risk    Last Housing Risk Score: 0  Physical Activity: Inactive   Days of Exercise per Week: 0 days   Minutes of Exercise per Session: 0 min  Social Connections: Engineer, building services of Communication with Friends and Family: More than three times a week   Frequency of Social Gatherings with Friends and Family: Once a week   Attends Religious Services: More than 4 times per year   Active Member of [employer] or Organizations: No   Attends Music therapist: More than 4 times per year   Marital Status: Married  Stress: No Stress Concern Present   Feeling of Stress : Not at all  Tobacco Use: Low Risk    Smoking Tobacco Use: Never   Smokeless Tobacco Use: Never  Transportation Needs: No Transportation Needs   Lack of Transportation (Medical): No   Lack of Transportation (Non-Medical): No    CCM Care Plan  Allergies  Allergen Reactions   Seroquel [Quetiapine Fumarate]     Hallucinations, palpitations    Codeine Nausea And Vomiting   Guaifenesin-Codeine Other (See Comments)    REACTION: feels spacey   Wellbutrin [Bupropion] Palpitations    hallucinations    Medications Reviewed Today     Reviewed by Charlton Haws, [facility] (Pharmacist) on 05/28/21 at 1045  Med List Status: <None>   Medication Order Taking? Sig Documenting Provider Last Dose Status Informant  albuterol (VENTOLIN HFA) 108 (90 Base) MCG/ACT inhaler [id number] Yes Inhale 2 puffs into the lungs every 6 (six) hours as needed for wheezing or shortness of breath. [name], MD Taking Active   ALPRAZolam Duanne Moron) 1 MG tablet [id number] Yes Take 1 mg by mouth as needed for anxiety. [provider] Taking Active   Camillo Flaming MEDICATION [id number] Yes 4 Prong cane Lyndal Pulley, DO Taking Active   AMBULATORY NON FORMULARY MEDICATION [id number] Yes 1 Units by Other route See admin instructions. Lyndal Pulley, DO Taking Active   atenolol (TENORMIN) 25 MG tablet [id number] Yes TAKE ONE TABLET BY MOUTH IN THE MORNING AND TAKE TWO TABLETS IN THE  Gennaro Africa, MD Taking Active   blood glucose meter kit and supplies KIT [id number] Yes Dispense based on patient and insurance preference. Use up to four times daily as directed. (FOR E11.9). [name], MD Taking Active   fluticasone [facility]) 50 MCG/ACT nasal spray [id number] Yes Place 2 sprays into both nostrils daily. [name], MD Taking Active   furosemide (LASIX) 40 MG tablet [id number] Yes Take 1 tablet (40 mg total) by mouth 2 (two) times daily. [name], MD Taking Active   hydrOXYzine (ATARAX/VISTARIL) 10 MG tablet [id number] Yes Take 1 tablet (10 mg total) by mouth 3 (three) times daily as needed for itching. Ok Edwards, PA-C Taking Active   levocetirizine (XYZAL) 5 MG tablet [id number] Yes Take 1 tablet (5 mg total) by mouth every evening. [name], MD Taking  Active   linaclotide (LINZESS) 290 MCG CAPS capsule [id number] Yes Take 1  capsule (290 mcg total) by mouth daily before breakfast. 2 week supply until she receive mail order [name], MD Taking Active   metFORMIN (GLUCOPHAGE) 500 MG tablet [id number] Yes Take 1 tablet (500 mg total) by mouth daily with breakfast. 2 week supply until she receive mail order [name], MD Taking Active   Multiple Vitamins-Minerals (MULTIVITAMIN WITH MINERALS) tablet [id number] Yes Take 1 tablet by mouth daily. [provider] Taking Active Self  omeprazole (PRILOSEC) 20 MG capsule [id number] Yes Take 1 capsule (20 mg total) by mouth daily. 2 week supply until she receive mail order [name], MD Taking Active   ondansetron [facility]) 4 MG tablet [id number] Yes Take 1 tablet (4 mg total) by mouth every 8 (eight) hours as needed for nausea or vomiting. [name], MD Taking Active     Discontinued 02/27/15 1924 polyethylene glycol (MIRALAX / GLYCOLAX) 17 g packet [id number] Yes Take 17 g by mouth daily. [provider]  Active   potassium chloride SA (KLOR-CON) 20 MEQ tablet [id number] Yes Take 1 tablet (20 mEq total) by mouth 2 (two) times daily. 2 week supply until she receive mail order  Patient taking differently: Take 20 mEq by mouth daily. 1 tablet by mouth daily   Burns, Claudina Lick, MD Taking Active   propranolol (INDERAL) 10 MG tablet [id number] Yes Take 1 tablet (10 mg total) by mouth 3 (three) times daily as needed (PALPITATIONS). Minus Breeding, MD Taking Active   rosuvastatin (CRESTOR) 20 MG tablet [id number]  Take 1 tablet (20 mg total) by mouth daily. [name], MD  Expired 03/25/21 2359   Semaglutide,0.25 or 0.5MG/DOS, (OZEMPIC, 0.25 OR 0.5 MG/DOSE,) 2 MG/1.5ML SOPN [id number] Yes Inject 0.5 mg into the skin once a week. [name], MD Taking Active   SUMAtriptan-naproxen [facility]) 85-500 MG tablet [id number] Yes Take 1 pill as needed for migraine, if migraine persists may repeat x1 after 2 hours [name], MD Taking Active   tiZANidine  (ZANAFLEX) 4 MG tablet [id number] Yes TAKE 1 TABLET BY MOUTH AT BEDTIME [name] M, DO Taking Active   venlafaxine XR (EFFEXOR-XR) 150 MG 24 hr capsule [id number] Yes Take 1 capsule (150 mg total) by mouth 2 (two) times daily. [name], MD Taking Active   Vitamin D, Ergocalciferol, (DRISDOL) 1.25 MG (50000 UNIT) CAPS capsule [id number] Yes TAKE ONE CAPSULE BY MOUTH EVERY SEVEN DAYS (MAY CONTAIN SOY OR PEANUT--TALK TO YOUR DOCTOR BEFORE TAKING IF YOU ARE ALLERGIC) Quay Burow, Claudina Lick, MD Taking Active   Med List Note Si Raider 09/15/11 1928): [facility] [place] patient.            Patient Active Problem List   Diagnosis Date Noted   B12 deficiency 02/05/2021   Morbid obesity ([place]) 12/08/2020   Stenosis of carotid artery 09/18/2020   Greater trochanteric bursitis of both hips 08/25/2020   Greater trochanteric bursitis of right hip 05/05/2020   Chronic knee pain after total replacement of right knee joint 01/14/2020   Chronic constipation 08/23/2019   Abdominal pain 08/23/2019   Bilateral carotid artery stenosis 06/04/2019   Osteopenia 05/19/2019   Left knee pain 12/11/2018   Class 2 severe obesity with serious comorbidity and body mass index (BMI) of 37.0 to 37.9 in adult [facility]) 08/31/2018   Degeneration of lumbar intervertebral disc 05/30/2018   Onychomycosis 12/07/2017   Left hip pain  06/12/2017   Acute left-sided low back pain without sciatica 06/12/2017   Meningioma ([place]) 03/28/2017   Panic attacks 09/27/2016   GERD (gastroesophageal reflux disease) 09/27/2016   Right ankle pain 08/19/2016   Diabetes ([place]) 02/15/2016   Lump of skin 11/28/2015   Migraine without aura and with status migrainosus, not intractable 10/03/2015   Numbness of left hand 10/03/2015   Cephalalgia 10/03/2015   Palpitations 09/08/2012   Sciatica of left side    Peripheral edema 05/09/2012   FATIGUE 02/23/2010   Obstructive sleep apnea 10/31/2009   External hemorrhoids 10/17/2009    Hyperlipidemia 01/12/2008   Depression, major, recurrent ([place]) 01/12/2008   Essential hypertension, benign 01/12/2008   Allergic rhinitis 01/12/2008    Immunization History  Administered Date(s) Administered   Fluad Quad(high Dose 65+) 07/14/2019, 10/31/2020   Influenza,inj,Quad PF,6+ Mos 07/25/2013, 09/09/2014, 08/22/2015, 09/27/2016, 09/27/2017, 08/01/2018   PFIZER Comirnaty(Gray Top)Covid-19 Tri-Sucrose Vaccine 03/30/2021   PFIZER(Purple Top)SARS-COV-2 Vaccination 12/19/2019, 01/09/2020, 08/05/2020   Pneumococcal Conjugate-13 05/18/2019   Pneumococcal Polysaccharide-23 01/30/2009, 06/02/2020   Td 03/17/2004   Tdap 09/09/2014   Zoster Recombinat (Shingrix) 08/07/2019, 10/28/2019    Conditions to be addressed/monitored:  Hypertension, Hyperlipidemia, Diabetes, Depression, Anxiety, and Allergic Rhinitis  Care Plan : [place]  Updates made by Charlton Haws, [place] since 05/28/2021 12:00 AM     Problem: Hypertension, Hyperlipidemia, Diabetes, Depression, Anxiety, and Allergic Rhinitis   Priority: High     Long-Range Goal: Disease management   Start Date: 01/22/2021  Expected End Date: 07/24/2021  This Visit's Progress: On track  Recent Progress: On track  Priority: High  Note:   Current Barriers:  Unable to independently monitor therapeutic efficacy  Pharmacist Clinical Goal(s):  Patient will achieve adherence to monitoring guidelines and medication adherence to achieve therapeutic efficacy through collaboration with PharmD and provider.   Interventions: 1:1 collaboration with [name], MD regarding development and update of comprehensive plan of care as evidenced by provider attestation and co-signature Inter-disciplinary care team collaboration (see longitudinal plan of care) Comprehensive medication review performed; medication list updated in electronic medical record  Hypertension / Palpitations (BP goal <130/80) -Not ideally controlled - home  BP at goal per patient; she reports increased frequency of palpitations, sometimes wakes her up at night; she uses propranolol for this but does not like to due to causing fatigue -Current treatment: Atenolol 25 mg AM and 50 mg PM  Furosemide 40 mg BID  Propranolol 10 mg TID prn (palpitations) -Current home readings: 140/89, HR 74 -Advised to f/u with cardiologist re: palpitations   Hyperlipidemia: (LDL goal < 70) -Controlled - LDL at goal after switching simvastatin to rosuvastatin  -Current treatment: Rosuvastatin 20 mg daily -Educated on Cholesterol goals; Benefits of statin for ASCVD risk reduction; -Recommended to continue current medication  Diabetes (A1c goal <7%) -Controlled - A1c at goal, pt is tolerating ozempic -Current medications: Metformin 500 mg daily Ozempic 0.25 mg weekly -Current home glucose readings fasting glucose: 100-120 -Current meal patterns: seen nutritionist -Current exercise: walking around neighborhood; planning to restart at aquatic center -Recommended to continue current medication  Depression/Anxiety (Goal: manage symptoms)  -Not ideally controlled - pt reports increased stress, possibly related to recent increase in palpitations; she reports compliance with medications as prescribed -pt reports hydroxyzine helps with allergies too -Current treatment: Venlafaxine XR 150 mg BID Alprazolam 1 mg PRN - QD-BID Hydroxyzine 10 mg TID - HS -PHQ9: 1 (12/2020) -GAD7: 12 (09/2019) -Connected with psyhiatrist for mental health support -Recommended  to continue current medication  Constipation (Goal: manage symptoms) -Not ideally controlled - pt reports docusate did not work for her; she still endorses constipation; she has not tried Miralax -Current treatment  Linzess 290 mcg  OTC docusate -Counseled on importance of hydration and fiber -Start Miralax once daily; may titrate up to TID if needed  Allergic rhinitis (Goal: manage symptoms) -Not ideally  controlled - Pt reports sinus issues are worse, she is still taking below medications -Current treatment  Fluticasone nasal spray BID Levocetirizine 5 mg HS Albuterol HFA prn -Recommended to continue current medication ntinue current medication  Health Maintenance -Vaccine gaps: none -pt reports she did start taking B12 and B6 supplements as directed by sports medicine and PCP  Patient Goals/Self-Care Activities Patient will:  - take medications as prescribed -focus on medication adherence by pill box -check glucose daily, document, and provide at future appointments -check blood pressure daily, document, and provide at future appointments -Make appt with cardiologist regarding palpitations -Start Miralax once a day for constipation - may increase to 2-3 times daily if needed       Medication Assistance: None required.  Patient affirms current coverage meets needs.  Compliance/Adherence/Medication fill history: Care Gaps: None  Star-Rating Drugs: Rosuvastatin - [facility] Metformin - [facility]  Patient's preferred pharmacy is:  [id number] [place] [address]Phone: [phone]: [phone] [facility] [place] [id number] [place] [address]Phone: [phone]: [phone] Uses pill box? Yes Pt endorses 100% compliance  We discussed: Current pharmacy is preferred with insurance plan and patient is satisfied with pharmacy services Patient decided to: Continue current medication management strategy  Care Plan and Follow Up Patient Decision:  Patient agrees to Care Plan and Follow-up.  Plan: Telephone follow up appointment with care management team member scheduled for:  6 months  LCharlene Brooke PharmD, BBrilliant CPP Clinical Pharmacist [phone]

## 2021-06-03 ENCOUNTER — Telehealth: Payer: Self-pay | Admitting: Cardiology

## 2021-06-03 NOTE — Telephone Encounter (Signed)
Erica [name] called to schedule an appointment for hypertension. Reports it was running high at the dentist office yesterday. Scheduled her for an appt on Monday 06/08/21. After scheduling advised pt to give me one moment to pull up a note to send to the nurse pt said "okay" and hung up. Please advise.

## 2021-06-03 NOTE — Telephone Encounter (Signed)
Spoke with pt regarding her BP. She states she believes she has been elevated for a few days. She is getting over an upper respiratory virus but denies OTC medication use other than cetirizine. She states she has had some HA's and used tylenol for pain. Denies NSAID use. Today her BP is at 147/88. She has made an appt with an APP for next week.   I advised pt to monitor her BP over the next few days and bring her readings in to her appt next week. If she remains elevated > 150/90 she should contact the office sooner.  I also suggested she may call her PCP for a sooner appt since her HTN is mainly managed by them. She agrees with this plan and had no additional needs.

## 2021-06-08 ENCOUNTER — Encounter: Payer: Self-pay | Admitting: Physician Assistant

## 2021-06-08 ENCOUNTER — Ambulatory Visit (INDEPENDENT_AMBULATORY_CARE_PROVIDER_SITE_OTHER): Payer: Medicare Other | Admitting: Physician Assistant

## 2021-06-08 ENCOUNTER — Ambulatory Visit: Payer: Medicare Other

## 2021-06-08 VITALS — BP 120/82 | HR 70 | Resp 20 | Ht 67.0 in | Wt 251.0 lb

## 2021-06-08 DIAGNOSIS — R0602 Shortness of breath: Secondary | ICD-10-CM | POA: Diagnosis not present

## 2021-06-08 DIAGNOSIS — R002 Palpitations: Secondary | ICD-10-CM | POA: Diagnosis not present

## 2021-06-08 DIAGNOSIS — E785 Hyperlipidemia, unspecified: Secondary | ICD-10-CM | POA: Diagnosis not present

## 2021-06-08 DIAGNOSIS — E119 Type 2 diabetes mellitus without complications: Secondary | ICD-10-CM | POA: Diagnosis not present

## 2021-06-08 DIAGNOSIS — I10 Essential (primary) hypertension: Secondary | ICD-10-CM

## 2021-06-08 MED ORDER — ATENOLOL 25 MG PO TABS: ORAL_TABLET | ORAL | 3 refills | Status: DC

## 2021-06-08 MED ORDER — ROSUVASTATIN CALCIUM 20 MG PO TABS: 20.0000 mg | ORAL_TABLET | Freq: Every day | ORAL | 3 refills | Status: DC

## 2021-06-08 NOTE — Progress Notes (Signed)
Cardiology Office Note:    Date:  06/10/2021   ID:  Erica [name], DOB Aug 01, 1956, MRN [id number]  PCP:  [name], MD   [facility] Providers Cardiologist:  Minus Breeding, MD     Referring MD: [name], MD   Chief Complaint  Patient presents with   Follow-up    Seen for Dr. Percival Spanish     History of Present Illness:    Erica [name] is a 65 y.o. female with a hx of depression, HTN, HLD, DM2, panic attack, carotid artery stenosis, obstructive sleep apnea and history of abnormal stress test.  Nuclear stress test obtained in December 2017 showed a small defect of mild severity present in the basal inferoseptal and mid inferoseptal location consistent with ischemia, this was managed medically.  Carotid Doppler obtained in September 2021 showed a 60 to 79% stenosis on the left side.  Heart monitor obtained in 2021 demonstrated sinus rhythm with no arrhythmia.  Patient was last seen by Dr. Percival Spanish in December 2021 at which time he was doing well.  Lower extremity venous Doppler obtained in March 2022 was negative for DVT.  Arterial ABI obtained in September 2022 showed normal ABI bilaterally, triphasic waveforms throughout both legs.  Patient presents today for follow-up due to recent elevated blood pressure reading.  At this time she continues to have intermittent palpitation.  I recommended a 1 week heart monitor.  She is currently taking 1 tablet of atenolol in the morning and 2 tablet of atenolol in the afternoon.  Although blood pressure has been elevated at home, her blood pressure is surprisingly normal in the office.  Looking back, her blood pressure in April and in June were also normal.  At this time, I do not recommend further up titration of blood pressure medication.  I will have her keep a blood pressure diary at home and I will bring her back in 4 to 5 weeks for reassessment.  Past Medical History:  Diagnosis Date   Abnormal stress test    a. 09/2016: NST  showed a small defect of mild severity present in the basal inferoseptal and mid inferoseptal location, consistent with ischemia. --> medically managed   ALLERGIC RHINITIS    Anxiety    Bursitis    DDD (degenerative disc disease), lumbar    ESI with Ramos (spring 2016)   Depression    Diabetes mellitus without complication (HCC)    Dyslipidemia    External hemorrhoids    GERD (gastroesophageal reflux disease)    Hypertension    Obesity    Osteoarthritis    Palpitations    a. prior event monitor showing sinus tachycardia, no PAF.    Panic attacks    Hx of depression   Sleep apnea    CPAP hs    Past Surgical History:  Procedure Laterality Date   ABDOMINAL HYSTERECTOMY     MASS EXCISION Left 12/30/2015   Procedure: EXCISION LEFT LEG MASS;  Surgeon: Donnie Mesa, MD;  Location: [facility];  Service: General;  Laterality: Left;   REPLACEMENT TOTAL KNEE Right    RIGHT OOPHORECTOMY     No cancer    Current Medications: Current Meds  Medication Sig   albuterol (VENTOLIN HFA) 108 (90 Base) MCG/ACT inhaler Inhale 2 puffs into the lungs every 6 (six) hours as needed for wheezing or shortness of breath.   ALPRAZolam (XANAX) 1 MG tablet Take 1 mg by mouth as needed for anxiety.   AMBULATORY  NON FORMULARY MEDICATION 4 Prong cane   AMBULATORY NON FORMULARY MEDICATION 1 Units by Other route See admin instructions.   blood glucose meter kit and supplies KIT Dispense based on patient and insurance preference. Use up to four times daily as directed. (FOR E11.9).   fluticasone (FLONASE) 50 MCG/ACT nasal spray Place 2 sprays into both nostrils daily.   furosemide (LASIX) 40 MG tablet Take 1 tablet (40 mg total) by mouth 2 (two) times daily.   hydrOXYzine (ATARAX/VISTARIL) 10 MG tablet Take 1 tablet (10 mg total) by mouth 3 (three) times daily as needed for itching.   levocetirizine (XYZAL) 5 MG tablet Take 1 tablet (5 mg total) by mouth every evening.   linaclotide (LINZESS) 290  MCG CAPS capsule Take 1 capsule (290 mcg total) by mouth daily before breakfast. 2 week supply until she receive mail order   metFORMIN (GLUCOPHAGE) 500 MG tablet Take 1 tablet (500 mg total) by mouth daily with breakfast. 2 week supply until she receive mail order   Multiple Vitamins-Minerals (MULTIVITAMIN WITH MINERALS) tablet Take 1 tablet by mouth daily.   omeprazole (PRILOSEC) 20 MG capsule Take 1 capsule (20 mg total) by mouth daily. 2 week supply until she receive mail order   ondansetron (ZOFRAN) 4 MG tablet Take 1 tablet (4 mg total) by mouth every 8 (eight) hours as needed for nausea or vomiting.   potassium chloride SA (KLOR-CON) 20 MEQ tablet Take 1 tablet (20 mEq total) by mouth 2 (two) times daily. 2 week supply until she receive mail order (Patient taking differently: Take 20 mEq by mouth daily. 1 tablet by mouth daily)   propranolol (INDERAL) 10 MG tablet Take 1 tablet (10 mg total) by mouth 3 (three) times daily as needed (PALPITATIONS).   Semaglutide,0.25 or 0.5MG/DOS, (OZEMPIC, 0.25 OR 0.5 MG/DOSE,) 2 MG/1.5ML SOPN Inject 0.5 mg into the skin once a week.   SUMAtriptan-naproxen (TREXIMET) 85-500 MG tablet Take 1 pill as needed for migraine, if migraine persists may repeat x1 after 2 hours   Tart Cherry (TART CHERRY ULTRA) 1200 MG CAPS Take by mouth.   tiZANidine (ZANAFLEX) 4 MG capsule Take 4 mg by mouth 3 (three) times daily.   Turmeric (QC TUMERIC COMPLEX PO) Take by mouth. 1200 mg daily   venlafaxine XR (EFFEXOR-XR) 150 MG 24 hr capsule Take 1 capsule (150 mg total) by mouth 2 (two) times daily.   Vitamin D, Ergocalciferol, (DRISDOL) 1.25 MG (50000 UNIT) CAPS capsule TAKE ONE CAPSULE BY MOUTH EVERY SEVEN DAYS (MAY CONTAIN SOY OR PEANUT--TALK TO YOUR DOCTOR BEFORE TAKING IF YOU ARE ALLERGIC)   [DISCONTINUED] atenolol (TENORMIN) 25 MG tablet TAKE ONE TABLET BY MOUTH IN THE MORNING AND TAKE TWO TABLETS IN THE  EVENING     Allergies:   Seroquel [quetiapine fumarate], Codeine,  Guaifenesin-codeine, and Wellbutrin [bupropion]   Social History   Socioeconomic History   Marital status: Married    Spouse name: Erica [name]   Number of children: 3   Years of education: Not on file   Highest education level: Not on file  Occupational History   Occupation: Disabled  Tobacco Use   Smoking status: Never   Smokeless tobacco: Never   Tobacco comments:    tried to as a teenager  Scientific laboratory technician Use: Never used  Substance and Sexual Activity   Alcohol use: Yes    Alcohol/week: 1.0 standard drink    Types: 1 Glasses of wine per week    Comment: one drink  a month   Drug use: No   Sexual activity: Not Currently  Other Topics Concern   Not on file  Social History Narrative   Married with children. Pt is on disablity. Previous worked in the school system   Social Determinants of Radio broadcast assistant Strain: Low Risk    Difficulty of Paying Living Expenses: Not hard at all  Food Insecurity: No Food Insecurity   Worried About Charity fundraiser in the Last Year: Never true   Arboriculturist in the Last Year: Never true  Transportation Needs: No Transportation Needs   Lack of Transportation (Medical): No   Lack of Transportation (Non-Medical): No  Physical Activity: Inactive   Days of Exercise per Week: 0 days   Minutes of Exercise per Session: 0 min  Stress: No Stress Concern Present   Feeling of Stress : Not at all  Social Connections: Socially Integrated   Frequency of Communication with Friends and Family: More than three times a week   Frequency of Social Gatherings with Friends and Family: Once a week   Attends Religious Services: More than 4 times per year   Active Member of [employer] or Organizations: No   Attends Music therapist: More than 4 times per year   Marital Status: Married     Family History: The patient's family history includes Alcoholism in her father; Allergies in her sister; Asthma in her son and another family member;  Breast cancer in her maternal aunt; Colon cancer in her cousin; Dementia in her father; Diabetes in her mother and son; Heart disease in her mother and son; Hyperlipidemia in her mother; Hypertension in her mother; Kidney failure in her father; Prostate cancer in her father and maternal uncle; Sleep apnea in her mother; Thyroid disease in her mother.  ROS:   Please see the history of present illness.     All other systems reviewed and are negative.  EKGs/Labs/Other Studies Reviewed:    The following studies were reviewed today:  Echo 10/21/2016 LV EF: 55% -   60%   -------------------------------------------------------------------  Indications:      (R94.31).   -------------------------------------------------------------------  History:   PMH:  Acquired from the patient and from the patient&'s  chart.  Palpitations.  Paroxysmal atrial fibrillation.  Risk  factors:  Hypertension. Diabetes mellitus. Morbidly obese.  Dyslipidemia.   -------------------------------------------------------------------  Study Conclusions   - Left ventricle: The cavity size was normal. Wall thickness was    normal. Systolic function was normal. The estimated ejection    fraction was in the range of 55% to 60%. Wall motion was normal;    there were no regional wall motion abnormalities. Features are    consistent with a pseudonormal left ventricular filling pattern,    with concomitant abnormal relaxation and increased filling    pressure (grade 2 diastolic dysfunction).    EKG:  EKG is ordered today.  The ekg ordered today demonstrates normal sinus rhythm, no significant ST-T wave changes  Recent Labs: 02/02/2021: ALT 11; Hemoglobin 12.1; Platelets 287.0; TSH 2.26 06/08/2021: BUN 10; Creatinine, Ser 0.72; Potassium 3.9; Sodium 142  Recent Lipid Panel    Component Value Date/Time   CHOL 139 12/08/2020 1156   CHOL 183 02/09/2018 1053   TRIG 126.0 12/08/2020 1156   TRIG 51 09/20/2006 0915   HDL  50.40 12/08/2020 1156   HDL 55 02/09/2018 1053   CHOLHDL 3 12/08/2020 1156   VLDL 25.2 12/08/2020 1156   LDLCALC  63 12/08/2020 1156   LDLCALC 86 06/02/2020 1013     Risk Assessment/Calculations:           Physical Exam:    VS:  BP 120/82   Pulse 70   Resp 20   Ht _0  (1.702 m)   Wt 251 lb (113.9 kg)   SpO2 97%   BMI 39.31 kg/m     Wt Readings from Last 3 Encounters:  06/08/21 251 lb (113.9 kg)  03/23/21 250 lb (113.4 kg)  02/06/21 250 lb (113.4 kg)     GEN:  Well nourished, well developed in no acute distress HEENT: Normal NECK: No JVD; No carotid bruits LYMPHATICS: No lymphadenopathy CARDIAC: RRR, no murmurs, rubs, gallops RESPIRATORY:  Clear to auscultation without rales, wheezing or rhonchi  ABDOMEN: Soft, non-tender, non-distended MUSCULOSKELETAL:  No edema; No deformity  SKIN: Warm and dry NEUROLOGIC:  Alert and oriented x 3 PSYCHIATRIC:  Normal affect   ASSESSMENT:    1. Palpitations   2. Essential hypertension   3. SOB (shortness of breath)   4. Dyslipidemia   5. Controlled type 2 diabetes mellitus without complication, without long-term current use of insulin (HCC)    PLAN:    In order of problems listed above:  Palpitation: Given persistent palpitation, I recommended a 1 week heart monitor.  Although she had a heart monitor in November of last year, however she was only able to get 1 day of data off of the heart monitor.  Given persistent symptoms and I recommend a repeat heart monitor.  Hypertension: Blood pressure stable.  Although there was some concern of elevated blood pressure at home, her blood pressure was normal in the cardiology office today.  We will keep a blood pressure diary and reassess in 4 to 5 weeks  Shortness of breath: Obtain basic metabolic panel  Hyperlipidemia: On Crestor  DM2: Managed by primary care provider        Medication Adjustments/Labs and Tests Ordered: Current medicines are reviewed at length with the  patient today.  Concerns regarding medicines are outlined above.  Orders Placed This Encounter  Procedures   Basic Metabolic Panel (BMET)   LONG TERM MONITOR (3-14 DAYS)   EKG 12-Lead   Meds ordered this encounter  Medications   atenolol (TENORMIN) 25 MG tablet    Sig: TAKE ONE TABLET BY MOUTH IN THE MORNING AND TAKE TWO TABLETS IN THE  EVENING    Dispense:  270 tablet    Refill:  3   rosuvastatin (CRESTOR) 20 MG tablet    Sig: Take 1 tablet (20 mg total) by mouth daily.    Dispense:  90 tablet    Refill:  3    Patient Instructions  Medication Instructions:  Your physician recommends that you continue on your current medications as directed. Please refer to the Current Medication list given to you today.  If you need a refill on your cardiac medications before your next appointment, please call your pharmacy*   Lab Work: TODAY:  BMP  If you have labs (blood work) drawn today and your tests are completely normal, you will receive your results only by: [place] (if you have MyChart) OR A paper copy in the mail If you have any lab test that is abnormal or we need to change your treatment, we will call you to review the results.   Testing/Procedures: None ordered   Follow-Up: At [facility], you and your health needs are our priority.  As part  of our continuing mission to provide you with exceptional heart care, we have created designated Provider Care Teams.  These Care Teams include your primary Cardiologist (physician) and Advanced Practice Providers (APPs -  Physician Assistants and Nurse Practitioners) who all work together to provide you with the care you need, when you need it.  We recommend signing up for the patient portal called "MyChart".  Sign up information is provided on this After Visit Summary.  MyChart is used to connect with patients for Virtual Visits (Telemedicine).  Patients are able to view lab/test results, encounter notes, upcoming appointments,  etc.  Non-urgent messages can be sent to your provider as well.   To learn more about what you can do with MyChart, go to [email or web address].    Your next appointment:   4-6 WEEKS   The format for your next appointment:   In Person  Provider:   You may see Minus Breeding, MD or one of the following Advanced Practice Providers on your designated Care Team:   Rosaria Ferries, PA-C Caron Presume, PA-C Jory Sims, DNP, ANP   Other Instructions  Monitor your blood pressure 2 X's a day.  The 1st should be 2 hours after your morning medications and the 2nd should be anytime at night, just as long as it is the same time every night.   See blood pressure diary.Venida Jarvis this out and bring with you to your next appointment.   Blood Pressure Record Sheet To take your blood pressure, you will need a blood pressure machine. You can buy a blood pressure machine (blood pressure monitor) at your clinic, drug store, or online. When choosing one, consider: An automatic monitor that has an arm cuff. A cuff that wraps snugly around your upper arm. You should be able to fit only one finger between your arm and the cuff. A device that stores blood pressure reading results. Do not choose a monitor that measures your blood pressure from your wrist or finger. Follow your health care provider's instructions for how to take your blood pressure. To use this form: Get one reading in the morning (a.m.) before you take any medicines. Get one reading in the evening (p.m.) before supper. Take at least 2 readings with each blood pressure check. This makes sure the results are correct. Wait 1-2 minutes between measurements. Write down the results in the spaces on this form. Repeat this once a week, or as told by your health care provider. Make a follow-up appointment with your health care provider to discuss the results. Blood pressure log Date: _______________________ a.m. _____________________(1st  reading) _____________________(2nd reading) p.m. _____________________(1st reading) _____________________(2nd reading) Date: _______________________ a.m. _____________________(1st reading) _____________________(2nd reading) p.m. _____________________(1st reading) _____________________(2nd reading) Date: _______________________ a.m. _____________________(1st reading) _____________________(2nd reading) p.m. _____________________(1st reading) _____________________(2nd reading) Date: _______________________ a.m. _____________________(1st reading) _____________________(2nd reading) p.m. _____________________(1st reading) _____________________(2nd reading) Date: _______________________ a.m. _____________________(1st reading) _____________________(2nd reading) p.m. _____________________(1st reading) _____________________(2nd reading) This information is not intended to replace advice given to you by your health care provider. Make sure you discuss any questions you have with your healthcare provider. Document Revised: 01/16/2020 Document Reviewed: 01/16/2020 Elsevier Patient Education  2022 [place], [place], [place]  06/10/2021 8:49 PM    [facility]

## 2021-06-08 NOTE — Patient Instructions (Addendum)
Medication Instructions:  Your physician recommends that you continue on your current medications as directed. Please refer to the Current Medication list given to you today.  If you need a refill on your cardiac medications before your next appointment, please call your pharmacy*   Lab Work: TODAY:  BMP  If you have labs (blood work) drawn today and your tests are completely normal, you will receive your results only by: [place] (if you have MyChart) OR A paper copy in the mail If you have any lab test that is abnormal or we need to change your treatment, we will call you to review the results.   Testing/Procedures: None ordered   Follow-Up: At [facility], you and your health needs are our priority.  As part of our continuing mission to provide you with exceptional heart care, we have created designated Provider Care Teams.  These Care Teams include your primary Cardiologist (physician) and Advanced Practice Providers (APPs -  Physician Assistants and Nurse Practitioners) who all work together to provide you with the care you need, when you need it.  We recommend signing up for the patient portal called "MyChart".  Sign up information is provided on this After Visit Summary.  MyChart is used to connect with patients for Virtual Visits (Telemedicine).  Patients are able to view lab/test results, encounter notes, upcoming appointments, etc.  Non-urgent messages can be sent to your provider as well.   To learn more about what you can do with MyChart, go to [email or web address].    Your next appointment:   4-6 WEEKS   The format for your next appointment:   In Person  Provider:   You may see Minus Breeding, MD or one of the following Advanced Practice Providers on your designated Care Team:   Rosaria Ferries, PA-C Caron Presume, PA-C Jory Sims, DNP, ANP   Other Instructions  Monitor your blood pressure 2 X's a day.  The 1st should be 2 hours after your  morning medications and the 2nd should be anytime at night, just as long as it is the same time every night.   See blood pressure diary.Venida Jarvis this out and bring with you to your next appointment.   Blood Pressure Record Sheet To take your blood pressure, you will need a blood pressure machine. You can buy a blood pressure machine (blood pressure monitor) at your clinic, drug store, or online. When choosing one, consider: An automatic monitor that has an arm cuff. A cuff that wraps snugly around your upper arm. You should be able to fit only one finger between your arm and the cuff. A device that stores blood pressure reading results. Do not choose a monitor that measures your blood pressure from your wrist or finger. Follow your health care provider's instructions for how to take your blood pressure. To use this form: Get one reading in the morning (a.m.) before you take any medicines. Get one reading in the evening (p.m.) before supper. Take at least 2 readings with each blood pressure check. This makes sure the results are correct. Wait 1-2 minutes between measurements. Write down the results in the spaces on this form. Repeat this once a week, or as told by your health care provider. Make a follow-up appointment with your health care provider to discuss the results. Blood pressure log Date: _______________________ a.m. _____________________(1st reading) _____________________(2nd reading) p.m. _____________________(1st reading) _____________________(2nd reading) Date: _______________________ a.m. _____________________(1st reading) _____________________(2nd reading) p.m. _____________________(1st reading) _____________________(2nd reading) Date: _______________________ a.m. _____________________(1st reading)  _____________________(2nd reading) p.m. _____________________(1st reading) _____________________(2nd reading) Date: _______________________ a.m. _____________________(1st reading)  _____________________(2nd reading) p.m. _____________________(1st reading) _____________________(2nd reading) Date: _______________________ a.m. _____________________(1st reading) _____________________(2nd reading) p.m. _____________________(1st reading) _____________________(2nd reading) This information is not intended to replace advice given to you by your health care provider. Make sure you discuss any questions you have with your healthcare provider. Document Revised: 01/16/2020 Document Reviewed: 01/16/2020 Elsevier Patient Education  [place].

## 2021-06-08 NOTE — Progress Notes (Unsigned)
Patient enrolled for [employer] to mail a 7 day ZIO XT monitor to her home.

## 2021-06-09 ENCOUNTER — Telehealth: Payer: Self-pay | Admitting: Pulmonary Disease

## 2021-06-09 DIAGNOSIS — G4733 Obstructive sleep apnea (adult) (pediatric): Secondary | ICD-10-CM

## 2021-06-09 LAB — BASIC METABOLIC PANEL
BUN/Creatinine Ratio: 14 (ref 12–28)
BUN: 10 mg/dL (ref 8–27)
CO2: 23 mmol/L (ref 20–29)
Calcium: 9.9 mg/dL (ref 8.7–10.3)
Chloride: 103 mmol/L (ref 96–106)
Creatinine, Ser: 0.72 mg/dL (ref 0.57–1.00)
Glucose: 91 mg/dL (ref 65–99)
Potassium: 3.9 mmol/L (ref 3.5–5.2)
Sodium: 142 mmol/L (ref 134–144)
eGFR: 92 mL/min/{1.73_m2} (ref >59)

## 2021-06-10 ENCOUNTER — Encounter: Payer: Self-pay | Admitting: Physician Assistant

## 2021-06-11 ENCOUNTER — Other Ambulatory Visit (INDEPENDENT_AMBULATORY_CARE_PROVIDER_SITE_OTHER): Payer: Medicare Other

## 2021-06-11 ENCOUNTER — Other Ambulatory Visit: Payer: Self-pay

## 2021-06-11 ENCOUNTER — Other Ambulatory Visit: Payer: Self-pay | Admitting: *Deleted

## 2021-06-11 DIAGNOSIS — R002 Palpitations: Secondary | ICD-10-CM | POA: Diagnosis not present

## 2021-06-11 NOTE — Progress Notes (Unsigned)
7 day ZIO XT mailed to patient 06/08/21 and applied to patient in office 06/11/21 using tincture of benzoin.

## 2021-06-12 NOTE — Telephone Encounter (Signed)
Called and spoke with pt and she stated that she was with [employer] before but she has changed her insurance to [facility] and she is having to change to [employer]>  she stated that she needs a new cpap machine since hers is leaking.  RA please advise. Thanks

## 2021-06-17 NOTE — Telephone Encounter (Signed)
New order has been placed.  Nothing further is needed.

## 2021-06-19 ENCOUNTER — Ambulatory Visit (HOSPITAL_COMMUNITY)
Admission: RE | Admit: 2021-06-19 | Payer: Medicare Other | Source: Ambulatory Visit | Attending: Cardiology | Admitting: Cardiology

## 2021-06-19 NOTE — Progress Notes (Signed)
[place] Phone: [phone] Subjective:   Fontaine No, am serving as a scribe for Dr. [name]. This visit occurred during the SARS-CoV-2 public health emergency.  Safety protocols were in place, including screening questions prior to the visit, additional usage of staff PPE, and extensive cleaning of exam room while observing appropriate contact time as indicated for disinfecting solutions.   I'm seeing this patient by the request  of:  [name], MD  CC: right hip and back pain   [id number]  03/23/2021 Patient has had back pain previously.  Has responded well to epidural.  Concern for some of the lumbar radiculopathy that could be contributing to some of the right lower extremity pain.  We discussed following through with the epidural.  Patient will consider it.  In addition to this we will get the ABI we have discussed secondary to the carotid stenosis.  Same plan otherwise. Have ruled out DVT previously and still same pain with no swelling   Continues to have pain in the leg but patient has had testing that did not show any significant loosening previously.  At this point we will get an ABI with patient having a history of carotid artery stenosis.  Patient has not had any deep venous thrombosis over the course of time.  Discussed with patient to continue the home exercises.  We do have patient getting a nerve conduction study in the next days.  In addition to this encouraged her to consider the epidural in her back to see if this is playing a role as well.  Patient will follow up with me 4 weeks after the epidural for further evaluation.  Patient knows if any worsening pain to seek medical attention immediately  Update 06/22/2021 YUKO COVENTRY is a 65 y.o. female coming in with complaint of R hip and R knee pain. Epidural 04/02/2021. Patient states that the epidural has started to wear off. Pain in  both joints.  Right hip exam shows more pain recently on the lateral aspect of the hip.  Waking her up at night.  Patient continues to have the pain in the knee.  Patient has had work-up including ruling out DVTs as well as an ABI that was unremarkable.  Nerve conduction studies have also been unremarkable for this right knee pain.  Seems to be more on the lateral aspect of the hip this time more than the back itself.      Past Medical History:  Diagnosis Date   Abnormal stress test    a. 09/2016: NST showed a small defect of mild severity present in the basal inferoseptal and mid inferoseptal location, consistent with ischemia. --> medically managed   ALLERGIC RHINITIS    Anxiety    Bursitis    DDD (degenerative disc disease), lumbar    ESI with [facility] (spring 2016)   Depression    Diabetes mellitus without complication (HCC)    Dyslipidemia    External hemorrhoids    GERD (gastroesophageal reflux disease)    Hypertension    Obesity    Osteoarthritis    Palpitations    a. prior event monitor showing sinus tachycardia, no PAF.    Panic attacks    Hx of depression   Sleep apnea    CPAP hs   Past Surgical History:  Procedure Laterality Date   ABDOMINAL HYSTERECTOMY     MASS EXCISION Left 12/30/2015   Procedure: EXCISION LEFT  LEG MASS;  Surgeon: Donnie Mesa, MD;  Location: [facility];  Service: General;  Laterality: Left;   REPLACEMENT TOTAL KNEE Right    RIGHT OOPHORECTOMY     No cancer   Social History   Socioeconomic History   Marital status: Married    Spouse name: [name]   Number of children: 3   Years of education: Not on file   Highest education level: Not on file  Occupational History   Occupation: Disabled  Tobacco Use   Smoking status: Never   Smokeless tobacco: Never   Tobacco comments:    tried to as a teenager  Media planner   Vaping Use: Never used  Substance and Sexual Activity   Alcohol use: Yes    Alcohol/week: 1.0 standard drink     Types: 1 Glasses of wine per week    Comment: one drink a month   Drug use: No   Sexual activity: Not Currently  Other Topics Concern   Not on file  Social History Narrative   Married with children. Pt is on disablity. Previous worked in the school system   Social Determinants of Radio broadcast assistant Strain: Low Risk    Difficulty of Paying Living Expenses: Not hard at all  Food Insecurity: No Food Insecurity   Worried About Charity fundraiser in the Last Year: Never true   Arboriculturist in the Last Year: Never true  Transportation Needs: No Transportation Needs   Lack of Transportation (Medical): No   Lack of Transportation (Non-Medical): No  Physical Activity: Inactive   Days of Exercise per Week: 0 days   Minutes of Exercise per Session: 0 min  Stress: No Stress Concern Present   Feeling of Stress : Not at all  Social Connections: Socially Integrated   Frequency of Communication with Friends and Family: More than three times a week   Frequency of Social Gatherings with Friends and Family: Once a week   Attends Religious Services: More than 4 times per year   Active Member of [employer] or Organizations: No   Attends Music therapist: More than 4 times per year   Marital Status: Married   Allergies  Allergen Reactions   Seroquel [Quetiapine Fumarate]     Hallucinations, palpitations   Codeine Nausea And Vomiting   Guaifenesin-Codeine Other (See Comments)    REACTION: feels spacey   Wellbutrin [Bupropion] Palpitations    hallucinations   Family History  Problem Relation Age of Onset   Heart disease Mother        Enlarged Heart, Pacemaker   Diabetes Mother    Thyroid disease Mother    Hyperlipidemia Mother    Hypertension Mother    Sleep apnea Mother    Dementia Father    Kidney failure Father    Prostate cancer Father    Alcoholism Father    Asthma Other    Allergies Sister    Asthma Son    Breast cancer Maternal Aunt        cousin    Colon cancer Cousin    Heart disease Son        CHF, morbid obesity   Prostate cancer Maternal Uncle    Diabetes Son     Current Outpatient Medications (Endocrine & Metabolic):    metFORMIN (GLUCOPHAGE) 500 MG tablet, Take 1 tablet (500 mg total) by mouth daily with breakfast. 2 week supply until she receive mail order   Semaglutide,0.25 or 0.5MG /DOS, (  OZEMPIC, 0.25 OR 0.5 MG/DOSE,) 2 MG/1.5ML SOPN, Inject 0.5 mg into the skin once a week.  Current Outpatient Medications (Cardiovascular):    atenolol (TENORMIN) 25 MG tablet, TAKE ONE TABLET BY MOUTH IN THE MORNING AND TAKE TWO TABLETS IN THE  EVENING   furosemide (LASIX) 40 MG tablet, Take 1 tablet (40 mg total) by mouth 2 (two) times daily.   propranolol (INDERAL) 10 MG tablet, Take 1 tablet (10 mg total) by mouth 3 (three) times daily as needed (PALPITATIONS).   rosuvastatin (CRESTOR) 20 MG tablet, Take 1 tablet (20 mg total) by mouth daily.  Current Outpatient Medications (Respiratory):    albuterol (VENTOLIN HFA) 108 (90 Base) MCG/ACT inhaler, Inhale 2 puffs into the lungs every 6 (six) hours as needed for wheezing or shortness of breath.   fluticasone (FLONASE) 50 MCG/ACT nasal spray, Place 2 sprays into both nostrils daily.   levocetirizine (XYZAL) 5 MG tablet, Take 1 tablet (5 mg total) by mouth every evening.  Current Outpatient Medications (Analgesics):    SUMAtriptan-naproxen (TREXIMET) 85-500 MG tablet, Take 1 pill as needed for migraine, if migraine persists may repeat x1 after 2 hours   Current Outpatient Medications (Other):    ALPRAZolam (XANAX) 1 MG tablet, Take 1 mg by mouth as needed for anxiety.   AMBULATORY NON FORMULARY MEDICATION, 4 Prong cane   AMBULATORY NON FORMULARY MEDICATION, 1 Units by Other route See admin instructions.   blood glucose meter kit and supplies KIT, Dispense based on patient and insurance preference. Use up to four times daily as directed. (FOR E11.9).   hydrOXYzine (ATARAX/VISTARIL) 10 MG  tablet, Take 1 tablet (10 mg total) by mouth 3 (three) times daily as needed for itching.   linaclotide (LINZESS) 290 MCG CAPS capsule, Take 1 capsule (290 mcg total) by mouth daily before breakfast. 2 week supply until she receive mail order   Multiple Vitamins-Minerals (MULTIVITAMIN WITH MINERALS) tablet, Take 1 tablet by mouth daily.   omeprazole (PRILOSEC) 20 MG capsule, Take 1 capsule (20 mg total) by mouth daily. 2 week supply until she receive mail order   ondansetron (ZOFRAN) 4 MG tablet, Take 1 tablet (4 mg total) by mouth every 8 (eight) hours as needed for nausea or vomiting.   potassium chloride SA (KLOR-CON) 20 MEQ tablet, Take 1 tablet (20 mEq total) by mouth 2 (two) times daily. 2 week supply until she receive mail order (Patient taking differently: Take 20 mEq by mouth daily. 1 tablet by mouth daily)   Tart Cherry (TART CHERRY ULTRA) 1200 MG CAPS, Take by mouth.   tiZANidine (ZANAFLEX) 4 MG capsule, Take 4 mg by mouth 3 (three) times daily.   Turmeric (QC TUMERIC COMPLEX PO), Take by mouth. 1200 mg daily   venlafaxine XR (EFFEXOR-XR) 150 MG 24 hr capsule, Take 1 capsule (150 mg total) by mouth 2 (two) times daily.   Vitamin D, Ergocalciferol, (DRISDOL) 1.25 MG (50000 UNIT) CAPS capsule, TAKE ONE CAPSULE BY MOUTH EVERY SEVEN DAYS (MAY CONTAIN SOY OR PEANUT--TALK TO YOUR DOCTOR BEFORE TAKING IF YOU ARE ALLERGIC)   Reviewed prior external information including notes and imaging from  primary care provider As well as notes that were available from care everywhere and other healthcare systems.  Past medical history, social, surgical and family history all reviewed in electronic medical record.  No pertanent information unless stated regarding to the chief complaint.   Review of Systems:  No headache, visual changes, nausea, vomiting, diarrhea, constipation, dizziness, abdominal pain, skin rash, fevers,  chills, night sweats, weight loss, swollen lymph nodes chest pain, shortness of  breath, mood changes. POSITIVE muscle aches, body aches, joint swelling  Objective  Blood pressure 116/78, pulse 70, height 5' 7"  (1.702 m), SpO2 98 %.   General: No apparent distress alert and oriented x3 mood and affect normal, dressed appropriately.  HEENT: Pupils equal, extraocular movements intact  Respiratory: Patient's speak in full sentences and does not appear short of breath  Cardiovascular: No lower extremity edema, non tender, no erythema  Gait antalgic gait noted.  Patient favoring the right side.  Is using a cane to help with mobilization. Severely tender to palpation over the greater trochanteric area on the right side.  Unable to do Corky Sox secondary to body habitus as well as pain.  Neurovascularly intact distally.  Patient does have significant tightness noted with straight leg test bilaterally.  Worsening back pain with any type of extension of the back.  Right knee exam still shows the replacement with trace effusion noted but no erythema noted.  Mild crepitus with range of motion.  95 degrees of flexion with near full extension noted today.  After verbal consent patient was prepped with alcohol swabs and with a 21-gauge 2 inch needle injected into the right greater trochanteric area with a total of 2 cc of 0.5% Marcaine and 1 cc of Kenalog 40 mg/mL.  No blood loss.  Band-Aid placed.  Postinjection instructions given    Impression and Recommendations:     The above documentation has been reviewed and is accurate and complete Lyndal Pulley, DO

## 2021-06-22 ENCOUNTER — Encounter: Payer: Self-pay | Admitting: Family Medicine

## 2021-06-22 ENCOUNTER — Other Ambulatory Visit: Payer: Self-pay

## 2021-06-22 ENCOUNTER — Ambulatory Visit (INDEPENDENT_AMBULATORY_CARE_PROVIDER_SITE_OTHER): Payer: Medicare Other | Admitting: Family Medicine

## 2021-06-22 ENCOUNTER — Other Ambulatory Visit (HOSPITAL_COMMUNITY): Payer: Self-pay | Admitting: Cardiology

## 2021-06-22 DIAGNOSIS — I773 Arterial fibromuscular dysplasia: Secondary | ICD-10-CM

## 2021-06-22 DIAGNOSIS — M25561 Pain in right knee: Secondary | ICD-10-CM

## 2021-06-22 DIAGNOSIS — M545 Low back pain, unspecified: Secondary | ICD-10-CM | POA: Diagnosis not present

## 2021-06-22 DIAGNOSIS — M7061 Trochanteric bursitis, right hip: Secondary | ICD-10-CM | POA: Diagnosis not present

## 2021-06-22 DIAGNOSIS — Z96651 Presence of right artificial knee joint: Secondary | ICD-10-CM | POA: Diagnosis not present

## 2021-06-22 DIAGNOSIS — G8929 Other chronic pain: Secondary | ICD-10-CM | POA: Diagnosis not present

## 2021-06-22 NOTE — Assessment & Plan Note (Signed)
Patient feels it slowly coming back at the moment.  Discussed with patient icing regimen, home exercises, continue to watch core strength.  We discussed different medications again but patient does not want to add too many at the moment.  Patient does have the Effexor that she is taking quite regularly.  Patient does have the Zanaflex for breakthrough.  Discussed we can repeat the epidurals and if no significant improvement patient will send [place] a message and we will order again.

## 2021-06-22 NOTE — Assessment & Plan Note (Signed)
Repeat injection given again today.  Chronic problem with exacerbation.  Discussed which activities to do which wants to avoid.  Increase activity slowly.  Follow-up with me again 6 to 8 weeks.  Discussed the importance of weight loss some of it is in compensation for patient's knee.

## 2021-06-22 NOTE — Patient Instructions (Signed)
If not better in one week, send message for epidural See me in 3 months Thurmond Butts  [phone]

## 2021-06-22 NOTE — Assessment & Plan Note (Signed)
Chronic problem.  Bone scan noted did not show any significant loosening.  Discussed with patient about icing regimen and home exercises.  Discussed over-the-counter medications.  Patient was unable to tolerate the Singulair.  Unable to do antihistamines.  Patient was fitted for a custom brace but has not had it actually placed yet.  Patient encouraged to call company to get this done.  Follow-up with me again in 3 months

## 2021-06-25 ENCOUNTER — Ambulatory Visit (HOSPITAL_COMMUNITY)
Admission: RE | Admit: 2021-06-25 | Payer: Medicare Other | Source: Ambulatory Visit | Attending: Cardiology | Admitting: Cardiology

## 2021-06-29 DIAGNOSIS — E119 Type 2 diabetes mellitus without complications: Secondary | ICD-10-CM | POA: Diagnosis not present

## 2021-07-01 ENCOUNTER — Other Ambulatory Visit: Payer: Self-pay

## 2021-07-01 ENCOUNTER — Ambulatory Visit (HOSPITAL_COMMUNITY)
Admission: RE | Admit: 2021-07-01 | Discharge: 2021-07-01 | Disposition: A | Discharge status: Home / Self Care | Payer: Medicare Other | Source: Ambulatory Visit | Attending: Cardiology | Admitting: Cardiology

## 2021-07-01 DIAGNOSIS — I773 Arterial fibromuscular dysplasia: Secondary | ICD-10-CM | POA: Insufficient documentation

## 2021-07-01 DIAGNOSIS — I6523 Occlusion and stenosis of bilateral carotid arteries: Secondary | ICD-10-CM | POA: Diagnosis not present

## 2021-07-02 NOTE — Progress Notes (Signed)
Cardiology Office Note:    Date:  07/03/2021   ID:  Erica [name], DOB 03/20/1956, MRN [id number]  PCP:  [name], MD [place] Cardiologist: Minus Breeding, MD   Reason for visit: Follow-up  History of Present Illness:    Erica [name] is a 65 y.o. female with a hx of depression, HTN, HLD, DM2, panic attack, carotid artery stenosis, obstructive sleep apnea and history of abnormal stress test.  Nuclear stress test obtained in December 2017 showed a small defect of mild severity present in the basal inferoseptal and mid inferoseptal location consistent with ischemia, this was managed medically.  Carotid Doppler obtained in September 2021 showed a 60 to 79% stenosis on the left side.  Heart monitor obtained in 2021 demonstrated sinus rhythm with no arrhythmia.   She last saw Erica [name] on June 08, 2021 for hypertension and palpitations.  She stated her blood pressure was high at home but her blood pressures have been normal in the office.  She was recommended to keep a blood pressure diary and come back today for reassessment.  Today, she thinks her blood pressure has been high on home readings because her blood pressure cuff needs a new battery.  She states she did not have any palpitations when she was wearing the Zio patch which she thinks she wore for about 1 week and then it fell off.  ZIO results are not back in yet.  She thinks her palpitations are related to panic attacks.  She has called EMS before with the panic attacks and they always say her EKG/heart rhythm is good.  She states palpitations sometimes wake her up out of sleep and sometimes happen during the day.  It can last for a while and then she feels drained after.  These symptoms have occurred for the last 25 years.  She denies chest pain, shortness of breath, syncope, orthopnea, PND or significant pedal edema.  She is taking Lasix 40 mg twice a day.  She states this was started by her orthopedic and she has  been on it since.   Past Medical History:  Diagnosis Date   Abnormal stress test    a. 09/2016: NST showed a small defect of mild severity present in the basal inferoseptal and mid inferoseptal location, consistent with ischemia. --> medically managed   ALLERGIC RHINITIS    Anxiety    Bursitis    DDD (degenerative disc disease), lumbar    ESI with Ramos (spring 2016)   Depression    Diabetes mellitus without complication (HCC)    Dyslipidemia    External hemorrhoids    GERD (gastroesophageal reflux disease)    Hypertension    Obesity    Osteoarthritis    Palpitations    a. prior event monitor showing sinus tachycardia, no PAF.    Panic attacks    Hx of depression   Sleep apnea    CPAP hs    Past Surgical History:  Procedure Laterality Date   ABDOMINAL HYSTERECTOMY     MASS EXCISION Left 12/30/2015   Procedure: EXCISION LEFT LEG MASS;  Surgeon: Donnie Mesa, MD;  Location: [facility];  Service: General;  Laterality: Left;   REPLACEMENT TOTAL KNEE Right    RIGHT OOPHORECTOMY     No cancer    Current Medications: Current Meds  Medication Sig   albuterol (VENTOLIN HFA) 108 (90 Base) MCG/ACT inhaler Inhale 2 puffs into the lungs every 6 (six) hours as needed for  wheezing or shortness of breath.   ALPRAZolam (XANAX) 1 MG tablet Take 1 mg by mouth as needed for anxiety.   AMBULATORY NON FORMULARY MEDICATION 4 Prong cane   AMBULATORY NON FORMULARY MEDICATION 1 Units by Other route See admin instructions.   atenolol (TENORMIN) 25 MG tablet TAKE ONE TABLET BY MOUTH IN THE MORNING AND TAKE TWO TABLETS IN THE  EVENING   blood glucose meter kit and supplies KIT Dispense based on patient and insurance preference. Use up to four times daily as directed. (FOR E11.9).   fluticasone (FLONASE) 50 MCG/ACT nasal spray Place 2 sprays into both nostrils daily.   hydrOXYzine (ATARAX/VISTARIL) 10 MG tablet Take 1 tablet (10 mg total) by mouth 3 (three) times daily as needed for  itching.   levocetirizine (XYZAL) 5 MG tablet Take 1 tablet (5 mg total) by mouth every evening.   linaclotide (LINZESS) 290 MCG CAPS capsule Take 1 capsule (290 mcg total) by mouth daily before breakfast. 2 week supply until she receive mail order   metFORMIN (GLUCOPHAGE) 500 MG tablet Take 1 tablet (500 mg total) by mouth daily with breakfast. 2 week supply until she receive mail order   Multiple Vitamins-Minerals (MULTIVITAMIN WITH MINERALS) tablet Take 1 tablet by mouth daily.   omeprazole (PRILOSEC) 20 MG capsule Take 1 capsule (20 mg total) by mouth daily. 2 week supply until she receive mail order   ondansetron (ZOFRAN) 4 MG tablet Take 1 tablet (4 mg total) by mouth every 8 (eight) hours as needed for nausea or vomiting.   propranolol (INDERAL) 10 MG tablet Take 1 tablet (10 mg total) by mouth 3 (three) times daily as needed (PALPITATIONS).   rosuvastatin (CRESTOR) 20 MG tablet Take 1 tablet (20 mg total) by mouth daily.   Semaglutide,0.25 or 0.5MG/DOS, (OZEMPIC, 0.25 OR 0.5 MG/DOSE,) 2 MG/1.5ML SOPN Inject 0.5 mg into the skin once a week.   SUMAtriptan-naproxen (TREXIMET) 85-500 MG tablet Take 1 pill as needed for migraine, if migraine persists may repeat x1 after 2 hours   Tart Cherry (TART CHERRY ULTRA) 1200 MG CAPS Take by mouth.   tiZANidine (ZANAFLEX) 4 MG capsule Take 4 mg by mouth 3 (three) times daily.   Turmeric (QC TUMERIC COMPLEX PO) Take by mouth. 1200 mg daily   venlafaxine XR (EFFEXOR-XR) 150 MG 24 hr capsule Take 1 capsule (150 mg total) by mouth 2 (two) times daily.   Vitamin D, Ergocalciferol, (DRISDOL) 1.25 MG (50000 UNIT) CAPS capsule TAKE ONE CAPSULE BY MOUTH EVERY SEVEN DAYS (MAY CONTAIN SOY OR PEANUT--TALK TO YOUR DOCTOR BEFORE TAKING IF YOU ARE ALLERGIC)   [DISCONTINUED] furosemide (LASIX) 40 MG tablet Take 1 tablet (40 mg total) by mouth 2 (two) times daily.   [DISCONTINUED] potassium chloride SA (KLOR-CON) 20 MEQ tablet Take 1 tablet (20 mEq total) by mouth 2 (two)  times daily. 2 week supply until she receive mail order (Patient taking differently: Take 20 mEq by mouth daily. 1 tablet by mouth daily)     Allergies:   Seroquel [quetiapine fumarate], Codeine, Guaifenesin-codeine, and Wellbutrin [bupropion]   Social History   Socioeconomic History   Marital status: Married    Spouse name: [name]   Number of children: 3   Years of education: Not on file   Highest education level: Not on file  Occupational History   Occupation: Disabled  Tobacco Use   Smoking status: Never   Smokeless tobacco: Never   Tobacco comments:    tried to as a teenager  Vaping Use   Vaping Use: Never used  Substance and Sexual Activity   Alcohol use: Yes    Alcohol/week: 1.0 standard drink    Types: 1 Glasses of wine per week    Comment: one drink a month   Drug use: No   Sexual activity: Not Currently  Other Topics Concern   Not on file  Social History Narrative   Married with children. Pt is on disablity. Previous worked in the school system   Social Determinants of Radio broadcast assistant Strain: Low Risk    Difficulty of Paying Living Expenses: Not hard at all  Food Insecurity: No Food Insecurity   Worried About Charity fundraiser in the Last Year: Never true   Arboriculturist in the Last Year: Never true  Transportation Needs: No Transportation Needs   Lack of Transportation (Medical): No   Lack of Transportation (Non-Medical): No  Physical Activity: Inactive   Days of Exercise per Week: 0 days   Minutes of Exercise per Session: 0 min  Stress: No Stress Concern Present   Feeling of Stress : Not at all  Social Connections: Socially Integrated   Frequency of Communication with Friends and Family: More than three times a week   Frequency of Social Gatherings with Friends and Family: Once a week   Attends Religious Services: More than 4 times per year   Active Member of [employer] or Organizations: No   Attends Music therapist: More than 4  times per year   Marital Status: Married     Family History: The patient's family history includes Alcoholism in her father; Allergies in her sister; Asthma in her son and another family member; Breast cancer in her maternal aunt; Colon cancer in her cousin; Dementia in her father; Diabetes in her mother and son; Heart disease in her mother and son; Hyperlipidemia in her mother; Hypertension in her mother; Kidney failure in her father; Prostate cancer in her father and maternal uncle; Sleep apnea in her mother; Thyroid disease in her mother.  ROS:   Please see the history of present illness.     EKGs/Labs/Other Studies Reviewed:    Recent Labs: 02/02/2021: ALT 11; Hemoglobin 12.1; Platelets 287.0; TSH 2.26 06/08/2021: BUN 10; Creatinine, Ser 0.72; Potassium 3.9; Sodium 142  Recent Lipid Panel    Component Value Date/Time   CHOL 139 12/08/2020 1156   CHOL 183 02/09/2018 1053   TRIG 126.0 12/08/2020 1156   TRIG 51 09/20/2006 0915   HDL 50.40 12/08/2020 1156   HDL 55 02/09/2018 1053   CHOLHDL 3 12/08/2020 1156   VLDL 25.2 12/08/2020 1156   LDLCALC 63 12/08/2020 1156   LDLCALC 86 06/02/2020 1013    Physical Exam:    VS:  BP 126/82   Pulse 62   Ht _0  (1.702 m)   Wt 252 lb (114.3 kg)   SpO2 97%   BMI 39.47 kg/m     Wt Readings from Last 3 Encounters:  07/03/21 252 lb (114.3 kg)  06/08/21 251 lb (113.9 kg)  03/23/21 250 lb (113.4 kg)     GEN:  Well nourished, well developed in no acute distress, obese HEENT: Normal NECK: No JVD; No carotid bruits CARDIAC: RRR, no murmurs, rubs, gallops RESPIRATORY:  Clear to auscultation without rales, wheezing or rhonchi  ABDOMEN: Soft, non-tender, non-distended MUSCULOSKELETAL: No edema; No deformity  SKIN: Warm and dry NEUROLOGIC:  Alert and oriented PSYCHIATRIC:  Normal affect   ASSESSMENT AND  PLAN   Palpitations -Related to anxiety  -ZIO ordered on 06/11/2021.  Do not have results yet -we will follow-up. -Encouraged CPAP  use.  Carotid disease/fibromuscular dysplasia -Carotid duplex 07/01/2021 shows moderate stenosis in the bilateral ICAs; beadlike appearance suggestive of fibromuscular dysplasia -Repeat duplex in 1 year -Given patient information about fibromuscular dysplasia.  Hypertension -Relatively controlled.  Blood pressure today 126/82.  After she replaces her blood pressure cuff battery, I have asked her to monitor her blood pressure occasionally.  If BP >130/80 consistently, recommend she call us back. - Goal BP is <130/80.  Recommend DASH diet (high in vegetables, fruits, low-fat dairy products, whole grains, poultry, fish, and nuts and low in sweets, sugar-sweetened beverages, and red meats), salt restriction and increase physical activity.  Hyperlipidemia -LDL 63 in February 2022. -Continue Crestor. - Discussed cholesterol lowering diets - Mediterranean diet, DASH diet, vegetarian diet, low-carbohydrate diet and avoidance of trans fats.  Discussed healthier choice substitutes.  Nuts, high-fiber foods, and fiber supplements may also improve lipids.    Obesity - Discussed how even a 5-10% weight loss can have cardiovascular benefits.   - Recommend moderate intensity activity for 30 minutes 5 days/week and the DASH diet.  LE edema, well controlled -I recommend she decrease her Lasix to 40 mg once daily given no significant lower extremity edema.  Decrease potassium to once daily.  Disposition - Follow-up in 6 months.  Recheck lipids at that time & f/u BP.          Medication Adjustments/Labs and Tests Ordered: Current medicines are reviewed at length with the patient today.  Concerns regarding medicines are outlined above.  No orders of the defined types were placed in this encounter.  Meds ordered this encounter  Medications   furosemide (LASIX) 40 MG tablet    Sig: Take 1 tablet (40 mg total) by mouth daily.    Dispense:  180 tablet    Refill:  1   potassium chloride SA (KLOR-CON) 20  MEQ tablet    Sig: Take 1 tablet (20 mEq total) by mouth daily. 1 tablet by mouth daily    Dispense:  90 tablet    Refill:  3       Signed, Gaston Islam  07/03/2021 11:29 AM    [facility]

## 2021-07-03 ENCOUNTER — Encounter: Payer: Self-pay | Admitting: Physician Assistant

## 2021-07-03 ENCOUNTER — Other Ambulatory Visit: Payer: Self-pay

## 2021-07-03 ENCOUNTER — Ambulatory Visit (INDEPENDENT_AMBULATORY_CARE_PROVIDER_SITE_OTHER): Payer: Medicare Other | Admitting: Physician Assistant

## 2021-07-03 VITALS — BP 126/82 | HR 62 | Ht 67.0 in | Wt 252.0 lb

## 2021-07-03 DIAGNOSIS — I773 Arterial fibromuscular dysplasia: Secondary | ICD-10-CM

## 2021-07-03 DIAGNOSIS — R002 Palpitations: Secondary | ICD-10-CM

## 2021-07-03 DIAGNOSIS — E785 Hyperlipidemia, unspecified: Secondary | ICD-10-CM

## 2021-07-03 DIAGNOSIS — I6523 Occlusion and stenosis of bilateral carotid arteries: Secondary | ICD-10-CM

## 2021-07-03 DIAGNOSIS — I10 Essential (primary) hypertension: Secondary | ICD-10-CM | POA: Diagnosis not present

## 2021-07-03 MED ORDER — FUROSEMIDE 40 MG PO TABS: 40.0000 mg | ORAL_TABLET | Freq: Every day | ORAL | 1 refills | Status: DC

## 2021-07-03 MED ORDER — POTASSIUM CHLORIDE CRYS ER 20 MEQ PO TBCR: 20.0000 meq | EXTENDED_RELEASE_TABLET | Freq: Every day | ORAL | 3 refills | Status: DC

## 2021-07-03 NOTE — Patient Instructions (Signed)
Medication Instructions:  Decrease Lasix 40 mg ( 1 Tablet Daily). Decrease Potassium 20 mg ( 1 Tablet Daily). *If you need a refill on your cardiac medications before your next appointment, please call your pharmacy*   Lab Work: No Labs If you have labs (blood work) drawn today and your tests are completely normal, you will receive your results only by: [place] (if you have MyChart) OR A paper copy in the mail If you have any lab test that is abnormal or we need to change your treatment, we will call you to review the results.   Testing/Procedures: No Testing    Follow-Up: At [facility], you and your health needs are our priority.  As part of our continuing mission to provide you with exceptional heart care, we have created designated Provider Care Teams.  These Care Teams include your primary Cardiologist (physician) and Advanced Practice Providers (APPs -  Physician Assistants and Nurse Practitioners) who all work together to provide you with the care you need, when you need it.  We recommend signing up for the patient portal called "MyChart".  Sign up information is provided on this After Visit Summary.  MyChart is used to connect with patients for Virtual Visits (Telemedicine).  Patients are able to view lab/test results, encounter notes, upcoming appointments, etc.  Non-urgent messages can be sent to your provider as well.   To learn more about what you can do with MyChart, go to [email or web address].    Your next appointment:   6 month(s)  The format for your next appointment:   In Person  Provider:   Almyra Deforest, PA-C Caron Presume, PA-C  Other Instructions Monitor Blood Pressure occasionally. If Blood Pressure is consistently over 130/80, Please call our office.

## 2021-07-07 DIAGNOSIS — R002 Palpitations: Secondary | ICD-10-CM | POA: Diagnosis not present

## 2021-07-09 ENCOUNTER — Other Ambulatory Visit: Payer: Medicare Other

## 2021-07-22 ENCOUNTER — Telehealth: Payer: Medicare Other

## 2021-07-23 ENCOUNTER — Encounter: Payer: Self-pay | Admitting: *Deleted

## 2021-07-26 ENCOUNTER — Other Ambulatory Visit: Payer: Self-pay | Admitting: Internal Medicine

## 2021-07-28 ENCOUNTER — Other Ambulatory Visit: Payer: Self-pay

## 2021-07-28 ENCOUNTER — Ambulatory Visit
Admission: RE | Admit: 2021-07-28 | Discharge: 2021-07-28 | Disposition: A | Discharge status: Home / Self Care | Payer: Medicare Other | Source: Ambulatory Visit | Attending: Obstetrics and Gynecology | Admitting: Obstetrics and Gynecology

## 2021-07-28 DIAGNOSIS — Z13820 Encounter for screening for osteoporosis: Secondary | ICD-10-CM

## 2021-07-28 DIAGNOSIS — M8589 Other specified disorders of bone density and structure, multiple sites: Secondary | ICD-10-CM | POA: Diagnosis not present

## 2021-08-09 ENCOUNTER — Encounter: Payer: Self-pay | Admitting: Internal Medicine

## 2021-08-09 NOTE — Patient Instructions (Addendum)
  Flu immunization administered today.  B12 injection today   Blood work was ordered.     Medications changes include :   stop the metformin.  Farxiga 10 mg daily - take before breakfast.    Rybelsus 3 mg daily.  We can increase this dose after one month.   Your prescription(s) have been submitted to your pharmacy. Please take as directed and contact our office if you believe you are having problem(s) with the medication(s).   A referral was ordered for uro-gynecology.        Someone from their office will call you to schedule an appointment.    Please followup in 3 months

## 2021-08-09 NOTE — Progress Notes (Signed)
Subjective:    Patient ID: Erica [name], female    DOB: 12-07-1955, 65 y.o.   MRN: [id number]  This visit occurred during the SARS-CoV-2 public health emergency.  Safety protocols were in place, including screening questions prior to the visit, additional usage of staff PPE, and extensive cleaning of exam room while observing appropriate contact time as indicated for disinfecting solutions.     HPI The patient is here for follow up of their chronic medical problems, including DM, htn, hld, migraines, gerd, constipation, B12 def   Still seeing psych  Urinary leakage - urinary urgency.  This is something that has gotten worse.  She is not able to make it to the bathroom.  She wonders if this was normal or not.   Constipation - taking linzess.  Drinking lot water.  She is still constipated.  She feels bloated and has a lot of gas.  She has tried several things in the past and they have not helped such as stool softeners.  Medications and allergies reviewed with patient and updated if appropriate.  Patient Active Problem List   Diagnosis Date Noted  . B12 deficiency 02/05/2021  . Morbid obesity ([place]) 12/08/2020  . Stenosis of carotid artery 09/18/2020  . Greater trochanteric bursitis of both hips 08/25/2020  . Greater trochanteric bursitis of right hip 05/05/2020  . 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 Chronic constipation 08/23/2019  . Abdominal pain 08/23/2019  . Bilateral carotid artery stenosis 06/04/2019  . Osteopenia 05/19/2019  . Left knee pain 12/11/2018  . Class 2 severe obesity with serious comorbidity and body mass index (BMI) of 37.0 to 37.9 in adult ([place]) 08/31/2018  . Degeneration of lumbar intervertebral disc 05/30/2018  . Onychomycosis 12/07/2017  . Left hip pain 06/12/2017  . Acute left-sided low back pain without sciatica 06/12/2017  . Meningioma ([place]) 03/28/2017  . Panic attacks 09/27/2016  . GERD  (gastroesophageal reflux disease) 09/27/2016  . Right ankle pain 08/19/2016  . Diabetes ([place]) 02/15/2016  . Lump of skin 11/28/2015  . Migraine without aura and with status migrainosus, not intractable 10/03/2015  . Numbness of left hand 10/03/2015  . Cephalalgia 10/03/2015  . Palpitations 09/08/2012  . Sciatica of left side   . Peripheral edema 05/09/2012  . FATIGUE 02/23/2010  . Obstructive sleep apnea 10/31/2009  . External hemorrhoids 10/17/2009  . Hyperlipidemia 01/12/2008  . Depression, major, recurrent ([place]) 01/12/2008  . Essential hypertension, benign 01/12/2008  . Allergic rhinitis 01/12/2008    Current Outpatient Medications on File Prior to Visit  Medication Sig Dispense Refill  . albuterol (VENTOLIN HFA) 108 (90 Base) MCG/ACT inhaler Inhale 2 puffs into the lungs every 6 (six) hours as needed for wheezing or shortness of breath. 16 g 3  . ALPRAZolam (XANAX) 1 MG tablet Take 1 mg by mouth as needed for anxiety.    . AMBULATORY NON FORMULARY MEDICATION 4 Prong cane 1 Units 0  . AMBULATORY NON FORMULARY MEDICATION 1 Units by Other route See admin instructions. 1 Units ZERO  . atenolol (TENORMIN) 25 MG tablet TAKE ONE TABLET BY MOUTH IN THE MORNING AND TAKE TWO TABLETS IN THE  EVENING 270 tablet 3  . blood glucose meter kit and supplies KIT Dispense based on patient and insurance preference. Use up to four times daily as directed. (FOR E11.9). 1 each 0  . fluticasone (FLONASE) 50 MCG/ACT nasal spray Place 2 sprays into both nostrils daily. [place]  g 0  . furosemide (LASIX) 40 MG tablet Take 1 tablet (40 mg total) by mouth daily. 180 tablet 1  . hydrOXYzine (ATARAX/VISTARIL) 10 MG tablet Take 1 tablet (10 mg total) by mouth 3 (three) times daily as needed for itching. 15 tablet 0  . levocetirizine (XYZAL) 5 MG tablet Take 1 tablet (5 mg total) by mouth every evening. 90 tablet 2  . linaclotide (LINZESS) 290 MCG CAPS capsule Take 1 capsule (290 mcg total) by mouth daily before  breakfast. 2 week supply until she receive mail order 90 capsule 2  . metFORMIN (GLUCOPHAGE) 500 MG tablet Take 1 tablet (500 mg total) by mouth daily with breakfast. 2 week supply until she receive mail order 90 tablet 1  . Multiple Vitamins-Minerals (MULTIVITAMIN WITH MINERALS) tablet Take 1 tablet by mouth daily.    Marland Kitchen omeprazole (PRILOSEC) 20 MG capsule Take 1 capsule (20 mg total) by mouth daily. 2 week supply until she receive mail order 90 capsule 2  . ondansetron (ZOFRAN) 4 MG tablet Take 1 tablet (4 mg total) by mouth every 8 (eight) hours as needed for nausea or vomiting. 40 tablet 0  . potassium chloride SA (KLOR-CON) 20 MEQ tablet Take 1 tablet (20 mEq total) by mouth daily. 1 tablet by mouth daily 90 tablet 3  . propranolol (INDERAL) 10 MG tablet Take 1 tablet (10 mg total) by mouth 3 (three) times daily as needed (PALPITATIONS). 60 tablet 3  . rosuvastatin (CRESTOR) 20 MG tablet Take 1 tablet (20 mg total) by mouth daily. 90 tablet 3  . Semaglutide,0.25 or 0.5MG /DOS, (OZEMPIC, 0.25 OR 0.5 MG/DOSE,) 2 MG/1.5ML SOPN Inject 0.5 mg into the skin once a week. 6 mL 1  . SUMAtriptan-naproxen (TREXIMET) 85-500 MG tablet Take 1 pill as needed for migraine, if migraine persists may repeat x1 after 2 hours 10 tablet 8  . Tart Cherry (TART CHERRY ULTRA) 1200 MG CAPS Take by mouth.    Marland Kitchen tiZANidine (ZANAFLEX) 4 MG capsule Take 4 mg by mouth 3 (three) times daily.    . Turmeric (QC TUMERIC COMPLEX PO) Take by mouth. 1200 mg daily    . venlafaxine XR (EFFEXOR-XR) 150 MG 24 hr capsule Take 1 capsule (150 mg total) by mouth 2 (two) times daily. 180 capsule 0  . Vitamin D, Ergocalciferol, (DRISDOL) 1.25 MG (50000 UNIT) CAPS capsule 1 CAP EVERY 7 DAYS (MAY CONTAIN SOY OR PEANUT-TALK TO YOUR DOCTOR BEFORE TAKING IF YOU ARE ALLERGIC) 12 capsule 0  . [DISCONTINUED] oxycodone (OXY-IR) 5 MG capsule Take 5 mg by mouth every 4 (four) hours as needed. For pain.     No current facility-administered medications on  file prior to visit.    Past Medical History:  Diagnosis Date  . Abnormal stress test    a. 09/2016: NST showed a small defect of mild severity present in the basal inferoseptal and mid inferoseptal location, consistent with ischemia. --> medically managed  . ALLERGIC RHINITIS   . Anxiety   . Bursitis   . DDD (degenerative disc disease), lumbar    ESI with Ramos (spring 2016)  . Depression   . Diabetes mellitus without complication ([place])   . Dyslipidemia   . External hemorrhoids   . GERD (gastroesophageal reflux disease)   . Hypertension   . Obesity   . Osteoarthritis   . Palpitations    a. prior event monitor showing sinus tachycardia, no PAF.   Marland Kitchen Panic attacks    Hx of depression  . Sleep  apnea    CPAP hs    Past Surgical History:  Procedure Laterality Date  . ABDOMINAL HYSTERECTOMY    . MASS EXCISION Left 12/30/2015   Procedure: EXCISION LEFT LEG MASS;  Surgeon: Donnie Mesa, MD;  Location: [facility];  Service: General;  Laterality: Left;  . REPLACEMENT TOTAL KNEE Right   . RIGHT OOPHORECTOMY     No cancer    Social History   Socioeconomic History  . Marital status: Married    Spouse name: [name]  . Number of children: 3  . Years of education: Not on file  . Highest education level: Not on file  Occupational History  . Occupation: Disabled  Tobacco Use  . Smoking status: Never  . Smokeless tobacco: Never  . Tobacco comments:    tried to as a teenager  Vaping Use  . Vaping Use: Never used  Substance and Sexual Activity  . Alcohol use: Yes    Alcohol/week: 1.0 standard drink    Types: 1 Glasses of wine per week    Comment: one drink a month  . Drug use: No  . Sexual activity: Not Currently  Other Topics Concern  . Not on file  Social History Narrative   Married with children. Pt is on disablity. Previous worked in the school system   Social Determinants of Radio broadcast assistant Strain: [place]   . Difficulty of Paying Living  Expenses: Not hard at all  Food Insecurity: No Food Insecurity  . Worried About Charity fundraiser in the Last Year: Never true  . Ran Out of Food in the Last Year: Never true  Transportation Needs: No Transportation Needs  . Lack of Transportation (Medical): No  . Lack of Transportation (Non-Medical): No  Physical Activity: Inactive  . Days of Exercise per Week: 0 days  . Minutes of Exercise per Session: 0 min  Stress: No Stress Concern Present  . Feeling of Stress : Not at all  Social Connections: Socially Integrated  . Frequency of Communication with Friends and Family: More than three times a week  . Frequency of Social Gatherings with Friends and Family: Once a week  . Attends Religious Services: More than 4 times per year  . Active Member of Clubs or Organizations: No  . Attends Archivist Meetings: More than 4 times per year  . Marital Status: Married    Family History  Problem Relation Age of Onset  . Heart disease Mother        Enlarged Heart, Pacemaker  . Diabetes Mother   . Thyroid disease Mother   . Hyperlipidemia Mother   . Hypertension Mother   . Sleep apnea Mother   . Dementia Father   . Kidney failure Father   . Prostate cancer Father   . Alcoholism Father   . Asthma Other   . Allergies Sister   . Asthma Son   . Breast cancer Maternal Aunt        cousin  . Colon cancer Cousin   . Heart disease Son        CHF, morbid obesity  . Prostate cancer Maternal Uncle   . Diabetes Son     Review of Systems  Constitutional:  Negative for chills and fever.  Respiratory:  Negative for cough, shortness of breath and wheezing.   Cardiovascular:  Positive for palpitations. Negative for chest pain and leg swelling.  Gastrointestinal:  Positive for abdominal distention and constipation.  Neurological:  Positive for light-headedness (occ). Negative for headaches.      Objective:   Vitals:   08/10/21 1115  BP: 116/74  Pulse: 73  Temp: 98.3 F (36.8  C)  SpO2: 97%   BP Readings from Last 3 Encounters:  08/10/21 116/74  07/03/21 126/82  06/22/21 116/78   Wt Readings from Last 3 Encounters:  08/10/21 248 lb (112.5 kg)  07/03/21 252 lb (114.3 kg)  06/08/21 251 lb (113.9 kg)   Body mass index is 38.84 kg/m.   Physical Exam    Constitutional: Appears well-developed and well-nourished. No distress.  HENT:  Head: Normocephalic and atraumatic.  Neck: Neck supple. No tracheal deviation present. No thyromegaly present.  No cervical lymphadenopathy Cardiovascular: Normal rate, regular rhythm and normal heart sounds.   No murmur heard. No carotid bruit .  No edema Pulmonary/Chest: Effort normal and breath sounds normal. No respiratory distress. No has no wheezes. No rales.  Skin: Skin is warm and dry. Not diaphoretic.  Psychiatric: Normal mood and affect. Behavior is normal.      Assessment & Plan:    See Problem List for Assessment and Plan of chronic medical problems.

## 2021-08-10 ENCOUNTER — Ambulatory Visit (INDEPENDENT_AMBULATORY_CARE_PROVIDER_SITE_OTHER): Payer: Medicare Other | Admitting: Internal Medicine

## 2021-08-10 ENCOUNTER — Other Ambulatory Visit: Payer: Self-pay

## 2021-08-10 VITALS — BP 116/74 | HR 73 | Temp 98.3°F | Ht 67.0 in | Wt 248.0 lb

## 2021-08-10 DIAGNOSIS — N3941 Urge incontinence: Secondary | ICD-10-CM | POA: Insufficient documentation

## 2021-08-10 DIAGNOSIS — K5909 Other constipation: Secondary | ICD-10-CM | POA: Diagnosis not present

## 2021-08-10 DIAGNOSIS — K219 Gastro-esophageal reflux disease without esophagitis: Secondary | ICD-10-CM | POA: Diagnosis not present

## 2021-08-10 DIAGNOSIS — I10 Essential (primary) hypertension: Secondary | ICD-10-CM

## 2021-08-10 DIAGNOSIS — E7849 Other hyperlipidemia: Secondary | ICD-10-CM | POA: Diagnosis not present

## 2021-08-10 DIAGNOSIS — E538 Deficiency of other specified B group vitamins: Secondary | ICD-10-CM | POA: Diagnosis not present

## 2021-08-10 DIAGNOSIS — E1169 Type 2 diabetes mellitus with other specified complication: Secondary | ICD-10-CM | POA: Diagnosis not present

## 2021-08-10 DIAGNOSIS — I773 Arterial fibromuscular dysplasia: Secondary | ICD-10-CM | POA: Insufficient documentation

## 2021-08-10 DIAGNOSIS — G43001 Migraine without aura, not intractable, with status migrainosus: Secondary | ICD-10-CM

## 2021-08-10 DIAGNOSIS — E785 Hyperlipidemia, unspecified: Secondary | ICD-10-CM | POA: Diagnosis not present

## 2021-08-10 DIAGNOSIS — F410 Panic disorder [episodic paroxysmal anxiety] without agoraphobia: Secondary | ICD-10-CM

## 2021-08-10 LAB — COMPREHENSIVE METABOLIC PANEL
ALT: 13 U/L (ref 0–35)
AST: 16 U/L (ref 0–37)
Albumin: 4 g/dL (ref 3.5–5.2)
Alkaline Phosphatase: 133 U/L — ABNORMAL HIGH (ref 39–117)
BUN: 16 mg/dL (ref 6–23)
CO2: 32 mEq/L (ref 19–32)
Calcium: 9.8 mg/dL (ref 8.4–10.5)
Chloride: 102 mEq/L (ref 96–112)
Creatinine, Ser: 0.78 mg/dL (ref 0.40–1.20)
GFR: 78.44 mL/min (ref >60.00)
Glucose, Bld: 113 mg/dL — ABNORMAL HIGH (ref 70–99)
Potassium: 4.1 mEq/L (ref 3.5–5.1)
Sodium: 139 mEq/L (ref 135–145)
Total Bilirubin: 0.3 mg/dL (ref 0.2–1.2)
Total Protein: 7.3 g/dL (ref 6.0–8.3)

## 2021-08-10 LAB — LIPID PANEL
Cholesterol: 140 mg/dL (ref 0–200)
HDL: 47.9 mg/dL (ref >39.00)
LDL Cholesterol: 71 mg/dL (ref 0–99)
NonHDL: 91.68
Total CHOL/HDL Ratio: 3
Triglycerides: 104 mg/dL (ref 0.0–149.0)
VLDL: 20.8 mg/dL (ref 0.0–40.0)

## 2021-08-10 LAB — HEMOGLOBIN A1C: Hgb A1c MFr Bld: 6.5 % (ref 4.6–6.5)

## 2021-08-10 MED ORDER — ROSUVASTATIN CALCIUM 20 MG PO TABS: 20.0000 mg | ORAL_TABLET | Freq: Every day | ORAL | 3 refills | Status: DC

## 2021-08-10 MED ORDER — RYBELSUS 3 MG PO TABS: 3.0000 mg | ORAL_TABLET | Freq: Every day | ORAL | 1 refills | Status: DC

## 2021-08-10 MED ORDER — DAPAGLIFLOZIN PROPANEDIOL 10 MG PO TABS: 10.0000 mg | ORAL_TABLET | Freq: Every day | ORAL | 1 refills | Status: DC

## 2021-08-10 NOTE — Assessment & Plan Note (Signed)
New problem Has been experiencing urinary urgency incontinence Discussed treatment options including medications, pelvic physical therapy, going [place] more frequently, wearing a pad, changing drinks that she typically drinks She is interested in seeing a specialist-referral for urogynecology

## 2021-08-10 NOTE — Assessment & Plan Note (Addendum)
Chronic Sugars have been well controlled She would benefit greatly from weight loss-she ran out of the Ozempic a while ago and did not call for refill, but does not feel it was helping with her weight, but she was also forgetting to take it at times Stop metformin Start Rybelsus 3 mg daily-we can increase this after the first month if she tolerates it well-she should remember this better than the injection once a week Start Farxiga 10 mg daily Advised to stop snacking in between meals-most likely she is consuming more calories than she realizes Increase activity A1c today

## 2021-08-10 NOTE — Assessment & Plan Note (Signed)
Chronic Continue 290 mcg Linzess daily Discussed different options above think she can add in-she will try move tea to see if that helps Stool softener, Metamucil, magnesium Continue increased water intake Increased activity possible

## 2021-08-10 NOTE — Assessment & Plan Note (Signed)
Chronic Continue Crestor 20 mg daily Encouraged weight loss Healthy diet, regular exercise

## 2021-08-10 NOTE — Assessment & Plan Note (Signed)
Chronic She is taking oral B12, but does not feel any better She states the B12 injection she got last time did help B12 injection today May need to consider monthly B12 injections if she wants

## 2021-08-10 NOTE — Assessment & Plan Note (Signed)
Chronic Management per psychiatry 

## 2021-08-10 NOTE — Assessment & Plan Note (Signed)
Chronic Cardiology monitoring carotid artery stenosis On rosuvastatin 20 mg daily Advised starting aspirin 81 mg daily

## 2021-08-10 NOTE — Assessment & Plan Note (Signed)
Chronic GERD controlled Continue omeprazole 20 mg daily  

## 2021-08-10 NOTE — Assessment & Plan Note (Signed)
Chronic Infrequent migraines Continue Treximet as needed 

## 2021-08-10 NOTE — Assessment & Plan Note (Signed)
Chronic Blood pressure well controlled CMP Continue atenolol 25 mg daily in the morning and 50 mg in the evening

## 2021-08-11 ENCOUNTER — Other Ambulatory Visit: Payer: Self-pay | Admitting: Internal Medicine

## 2021-08-11 DIAGNOSIS — J301 Allergic rhinitis due to pollen: Secondary | ICD-10-CM

## 2021-08-11 MED ORDER — VENLAFAXINE HCL ER 150 MG PO CP24: 150.0000 mg | ORAL_CAPSULE | Freq: Two times a day (BID) | ORAL | 2 refills | Status: DC

## 2021-08-11 MED ORDER — LEVOCETIRIZINE DIHYDROCHLORIDE 5 MG PO TABS: 5.0000 mg | ORAL_TABLET | Freq: Every evening | ORAL | 2 refills | Status: DC

## 2021-08-11 MED ORDER — OMEPRAZOLE 20 MG PO CPDR: 20.0000 mg | DELAYED_RELEASE_CAPSULE | Freq: Every day | ORAL | 2 refills | Status: DC

## 2021-08-11 MED ORDER — LINACLOTIDE 290 MCG PO CAPS: 290.0000 ug | ORAL_CAPSULE | Freq: Every day | ORAL | 2 refills | Status: DC

## 2021-09-09 ENCOUNTER — Encounter: Payer: Self-pay | Admitting: Internal Medicine

## 2021-09-10 NOTE — Telephone Encounter (Signed)
Patient is requesting a call back to discuss covid symptoms and alternative medications

## 2021-09-11 ENCOUNTER — Telehealth (INDEPENDENT_AMBULATORY_CARE_PROVIDER_SITE_OTHER): Payer: Medicare Other | Admitting: Nurse Practitioner

## 2021-09-11 ENCOUNTER — Other Ambulatory Visit: Payer: Self-pay

## 2021-09-11 VITALS — BP 134/80 | HR 71

## 2021-09-11 DIAGNOSIS — U071 COVID-19: Secondary | ICD-10-CM | POA: Insufficient documentation

## 2021-09-11 MED ORDER — MOLNUPIRAVIR EUA 200MG CAPSULE: 4.0000 | ORAL_CAPSULE | Freq: Two times a day (BID) | ORAL | 0 refills | Status: AC

## 2021-09-11 NOTE — Assessment & Plan Note (Signed)
Discussed treatment options with patient.  Did discuss that they are EUA only discussed common side effects of antiviral medication chosen.  After discussion patient elected to proceed with antiviral medication.  Did discuss signs and symptoms as when to seek urgent or emergent health care patient knowledge.  Also discussed quarantine guidelines according to the [facility] recommendations. Start Molnupiraviras soon as possible.

## 2021-09-11 NOTE — Progress Notes (Signed)
Patient ID: Erica [name], female    DOB: 18-Jan-1956, 65 y.o.   MRN: [id number]  Virtual visit completed through [employer], a video enabled telemedicine application. Due to national recommendations of social distancing due to COVID-19, a virtual visit is felt to be most appropriate for this patient at this time. Reviewed limitations, risks, security and privacy concerns of performing a virtual visit and the availability of in person appointments. I also reviewed that there may be a patient responsible charge related to this service. The patient agreed to proceed.   Patient location: home Provider location: [place] at [facility], office Persons participating in this virtual visit: patient, provider   If any vitals were documented, they were collected by patient at home unless specified below.    BP 134/80 Comment: per patient  Pulse 71    CC:  Covid 19 Subjective:   HPI: Erica [name] is a 65 y.o. female presenting on 09/11/2021 for Covid Positive (On 09/09/21, sx started on 09/06/21- nasal congestion, chest congestion, cough-clear phlegm, runny nose, post nasal drip, headache, has felt hot and cold. Has been taking Tylenol and Robitussin.)   Sunday symtptoms started Tested Wednesday and it was positive. Test [facility] done [facility] a local [facility]. Pfizer x2 and booster Visited her sister and was around family  Has been taking allergy medicine and tylenol, flonase, Has helped     Relevant past medical, surgical, family and social history reviewed and updated as indicated. Interim medical history since our last visit reviewed. Allergies and medications reviewed and updated. Outpatient Medications Prior to Visit  Medication Sig Dispense Refill   albuterol (VENTOLIN HFA) 108 (90 Base) MCG/ACT inhaler Inhale 2 puffs into the lungs every 6 (six) hours as needed for wheezing or shortness of breath. 16 g 3   ALPRAZolam (XANAX) 1 MG tablet Take 1 mg by mouth as needed for anxiety.      AMBULATORY NON FORMULARY MEDICATION 4 Prong cane 1 Units 0   atenolol (TENORMIN) 25 MG tablet TAKE ONE TABLET BY MOUTH IN THE MORNING AND TAKE TWO TABLETS IN THE  EVENING 270 tablet 3   blood glucose meter kit and supplies KIT Dispense based on patient and insurance preference. Use up to four times daily as directed. (FOR E11.9). 1 each 0   dapagliflozin propanediol (FARXIGA) 10 MG TABS tablet Take 1 tablet (10 mg total) by mouth daily before breakfast. 90 tablet 1   fluticasone (FLONASE) 50 MCG/ACT nasal spray Place 2 sprays into both nostrils daily. 16 g 0   furosemide (LASIX) 40 MG tablet Take 1 tablet (40 mg total) by mouth daily. 180 tablet 1   hydrOXYzine (ATARAX/VISTARIL) 10 MG tablet Take 1 tablet (10 mg total) by mouth 3 (three) times daily as needed for itching. 15 tablet 0   levocetirizine (XYZAL) 5 MG tablet Take 1 tablet (5 mg total) by mouth every evening. 90 tablet 2   linaclotide (LINZESS) 290 MCG CAPS capsule Take 1 capsule (290 mcg total) by mouth daily before breakfast. 90 capsule 2   Multiple Vitamins-Minerals (MULTIVITAMIN WITH MINERALS) tablet Take 1 tablet by mouth daily.     omeprazole (PRILOSEC) 20 MG capsule Take 1 capsule (20 mg total) by mouth daily. 90 capsule 2   potassium chloride SA (KLOR-CON) 20 MEQ tablet Take 1 tablet (20 mEq total) by mouth daily. 1 tablet by mouth daily 90 tablet 3   propranolol (INDERAL) 10 MG tablet Take 1 tablet (10 mg total) by mouth 3 (three)  times daily as needed (PALPITATIONS). 60 tablet 3   rosuvastatin (CRESTOR) 20 MG tablet Take 1 tablet (20 mg total) by mouth daily. 90 tablet 3   Semaglutide (RYBELSUS) 3 MG TABS Take 3 mg by mouth daily before breakfast. 90 tablet 1   SUMAtriptan-naproxen (TREXIMET) 85-500 MG tablet Take 1 pill as needed for migraine, if migraine persists may repeat x1 after 2 hours 10 tablet 8   Tart Cherry (TART CHERRY ULTRA) 1200 MG CAPS Take by mouth.     tiZANidine (ZANAFLEX) 4 MG capsule Take 4 mg by mouth 3  (three) times daily.     Turmeric (QC TUMERIC COMPLEX PO) Take by mouth. 1200 mg daily     venlafaxine XR (EFFEXOR-XR) 150 MG 24 hr capsule Take 1 capsule (150 mg total) by mouth 2 (two) times daily. 180 capsule 2   Vitamin D, Ergocalciferol, (DRISDOL) 1.25 MG (50000 UNIT) CAPS capsule 1 CAP EVERY 7 DAYS (MAY CONTAIN SOY OR PEANUT-TALK TO YOUR DOCTOR BEFORE TAKING IF YOU ARE ALLERGIC) (Patient not taking: Reported on 09/11/2021) 12 capsule 0   ondansetron (ZOFRAN) 4 MG tablet Take 1 tablet (4 mg total) by mouth every 8 (eight) hours as needed for nausea or vomiting. 40 tablet 0   No facility-administered medications prior to visit.     Per HPI unless specifically indicated in ROS section below Review of Systems  Constitutional:  Positive for chills and fever (subjective).  HENT:  Positive for congestion, postnasal drip and rhinorrhea. Negative for ear discharge, ear pain (pressure) and sore throat.   Respiratory:  Positive for cough (clear). Negative for shortness of breath.   Cardiovascular:  Negative for chest pain.  Gastrointestinal:  Positive for nausea. Negative for diarrhea and vomiting.  Musculoskeletal:  Positive for myalgias.  Neurological:  Positive for headaches.  Objective:  BP 134/80 Comment: per patient  Pulse 71   Wt Readings from Last 3 Encounters:  08/10/21 248 lb (112.5 kg)  07/03/21 252 lb (114.3 kg)  06/08/21 251 lb (113.9 kg)       Physical exam: Gen: alert, NAD, not ill appearing Pulm: speaks in complete sentences without increased work of breathing Psych: normal mood, normal thought content      Results for orders placed or performed in visit on 08/10/21  Lipid panel  Result Value Ref Range   Cholesterol 140 0 - 200 mg/dL   Triglycerides 104.0 0.0 - 149.0 mg/dL   HDL 47.90 >39.00 mg/dL   VLDL 20.8 0.0 - 40.0 mg/dL   LDL Cholesterol 71 0 - 99 mg/dL   Total CHOL/HDL Ratio 3    NonHDL 91.68   Hemoglobin A1c  Result Value Ref Range   Hgb A1c MFr Bld 6.5  4.6 - 6.5 %  Comprehensive metabolic panel  Result Value Ref Range   Sodium 139 135 - 145 mEq/L   Potassium 4.1 3.5 - 5.1 mEq/L   Chloride 102 96 - 112 mEq/L   CO2 32 19 - 32 mEq/L   Glucose, Bld 113 (H) 70 - 99 mg/dL   BUN 16 6 - 23 mg/dL   Creatinine, Ser 0.78 0.40 - 1.20 mg/dL   Total Bilirubin 0.3 0.2 - 1.2 mg/dL   Alkaline Phosphatase 133 (H) 39 - 117 U/L   AST 16 0 - 37 U/L   ALT 13 0 - 35 U/L   Total Protein 7.3 6.0 - 8.3 g/dL   Albumin 4.0 3.5 - 5.2 g/dL   GFR 78.44 >60.00 mL/min   Calcium  9.8 8.4 - 10.5 mg/dL   Assessment & Plan:   Problem List Items Addressed This Visit       Other   COVID-19 - Primary    Discussed treatment options with patient.  Did discuss that they are EUA only discussed common side effects of antiviral medication chosen.  After discussion patient elected to proceed with antiviral medication.  Did discuss signs and symptoms as when to seek urgent or emergent health care patient knowledge.  Also discussed quarantine guidelines according to the [facility] recommendations. Start Molnupiraviras soon as possible.      Relevant Medications   molnupiravir EUA (LAGEVRIO) 200 mg CAPS capsule     No orders of the defined types were placed in this encounter.  No orders of the defined types were placed in this encounter.   I discussed the assessment and treatment plan with the patient. The patient was provided an opportunity to ask questions and all were answered. The patient agreed with the plan and demonstrated an understanding of the instructions. The patient was advised to call back or seek an in-person evaluation if the symptoms worsen or if the condition fails to improve as anticipated.  Follow up plan: No follow-ups on file.  Romilda Garret, NP

## 2021-09-16 ENCOUNTER — Other Ambulatory Visit: Payer: Self-pay

## 2021-09-16 ENCOUNTER — Telehealth: Payer: Self-pay | Admitting: Internal Medicine

## 2021-09-16 MED ORDER — ATENOLOL 25 MG PO TABS: ORAL_TABLET | ORAL | 0 refills | Status: DC

## 2021-09-16 NOTE — Telephone Encounter (Signed)
1.Medication Requested: atenolol (TENORMIN) 25 MG tablet  2. Pharmacy (Name, Street, [place]):   3. On Med List: yes  4. Last Visit with PCP: 07-23-2021  5. Next visit date with PCP: 11-11-2021  Patient requesting new rx prescribed by provider  Patient [id number] [place] [place].  Patient requesting remainder sent to [facility] [place] - [address]

## 2021-09-16 NOTE — Telephone Encounter (Signed)
Send in 270 supply tomorrow to [facility]

## 2021-09-17 ENCOUNTER — Other Ambulatory Visit: Payer: Self-pay

## 2021-09-17 MED ORDER — ATENOLOL 25 MG PO TABS: ORAL_TABLET | ORAL | 2 refills | Status: DC

## 2021-09-17 NOTE — Telephone Encounter (Signed)
Sent in today 

## 2021-09-17 NOTE — Progress Notes (Deleted)
[place] Phone: [phone] Subjective:    I'm seeing this patient by the request  of:  [name], MD  CC: Right hip and knee pain  [id number]  06/22/2021 Repeat injection given again today.  Chronic problem with exacerbation.  Discussed which activities to do which wants to avoid.  Increase activity slowly.  Follow-up with me again 6 to 8 weeks.  Discussed the importance of weight loss some of it is in compensation for patient's knee.  Chronic problem.  Bone scan noted did not show any significant loosening.  Discussed with patient about icing regimen and home exercises.  Discussed over-the-counter medications.  Patient was unable to tolerate the Singulair.  Unable to do antihistamines.  Patient was fitted for a custom brace but has not had it actually placed yet.  Patient encouraged to call company to get this done.  Follow-up with me again in 3 months  Update 09/22/2021 DALEEN STEINHAUS is a 65 y.o. female coming in with complaint of R hip and R knee pain. History R TKR.  Once again patient did have bone scan of the hip that did not show any significant loosening.  Patient was having instability of the knee still noted and was to get a custom brace.  Patient states        Past Medical History:  Diagnosis Date   Abnormal stress test    a. 09/2016: NST showed a small defect of mild severity present in the basal inferoseptal and mid inferoseptal location, consistent with ischemia. --> medically managed   ALLERGIC RHINITIS    Anxiety    Bursitis    DDD (degenerative disc disease), lumbar    ESI with Ramos (spring 2016)   Depression    Diabetes mellitus without complication (HCC)    Dyslipidemia    External hemorrhoids    GERD (gastroesophageal reflux disease)    Hypertension    Obesity    Osteoarthritis    Palpitations    a. prior event monitor showing sinus tachycardia, no PAF.    Panic attacks     Hx of depression   Sleep apnea    CPAP hs   Past Surgical History:  Procedure Laterality Date   ABDOMINAL HYSTERECTOMY     MASS EXCISION Left 12/30/2015   Procedure: EXCISION LEFT LEG MASS;  Surgeon: Donnie Mesa, MD;  Location: [facility];  Service: General;  Laterality: Left;   REPLACEMENT TOTAL KNEE Right    RIGHT OOPHORECTOMY     No cancer   Social History   Socioeconomic History   Marital status: Married    Spouse name: [name]   Number of children: 3   Years of education: Not on file   Highest education level: Not on file  Occupational History   Occupation: Disabled  Tobacco Use   Smoking status: Never   Smokeless tobacco: Never   Tobacco comments:    tried to as a teenager  Media planner   Vaping Use: Never used  Substance and Sexual Activity   Alcohol use: Yes    Alcohol/week: 1.0 standard drink    Types: 1 Glasses of wine per week    Comment: one drink a month   Drug use: No   Sexual activity: Not Currently  Other Topics Concern   Not on file  Social History Narrative   Married with children. Pt is on disablity. Previous worked in the school system  Social Determinants of Health   Financial Resource Strain: Low Risk    Difficulty of Paying Living Expenses: Not hard at all  Food Insecurity: No Food Insecurity   Worried About Charity fundraiser in the Last Year: Never true   [place] in the Last Year: Never true  Transportation Needs: No Transportation Needs   Lack of Transportation (Medical): No   Lack of Transportation (Non-Medical): No  Physical Activity: Inactive   Days of Exercise per Week: 0 days   Minutes of Exercise per Session: 0 min  Stress: No Stress Concern Present   Feeling of Stress : Not at all  Social Connections: Socially Integrated   Frequency of Communication with Friends and Family: More than three times a week   Frequency of Social Gatherings with Friends and Family: Once a week   Attends Religious Services:  More than 4 times per year   Active Member of [employer] or Organizations: No   Attends Music therapist: More than 4 times per year   Marital Status: Married   Allergies  Allergen Reactions   Seroquel [Quetiapine Fumarate]     Hallucinations, palpitations   Codeine Nausea And Vomiting   Guaifenesin-Codeine Other (See Comments)    REACTION: feels spacey   Wellbutrin [Bupropion] Palpitations    hallucinations   Family History  Problem Relation Age of Onset   Heart disease Mother        Enlarged Heart, Pacemaker   Diabetes Mother    Thyroid disease Mother    Hyperlipidemia Mother    Hypertension Mother    Sleep apnea Mother    Dementia Father    Kidney failure Father    Prostate cancer Father    Alcoholism Father    Asthma Other    Allergies Sister    Asthma Son    Breast cancer Maternal Aunt        cousin   Colon cancer Cousin    Heart disease Son        CHF, morbid obesity   Prostate cancer Maternal Uncle    Diabetes Son     Current Outpatient Medications (Endocrine & Metabolic):    dapagliflozin propanediol (FARXIGA) 10 MG TABS tablet, Take 1 tablet (10 mg total) by mouth daily before breakfast.   Semaglutide (RYBELSUS) 3 MG TABS, Take 3 mg by mouth daily before breakfast.  Current Outpatient Medications (Cardiovascular):    atenolol (TENORMIN) 25 MG tablet, TAKE ONE TABLET BY MOUTH IN THE MORNING AND TAKE TWO TABLETS IN THE  EVENING   furosemide (LASIX) 40 MG tablet, Take 1 tablet (40 mg total) by mouth daily.   propranolol (INDERAL) 10 MG tablet, Take 1 tablet (10 mg total) by mouth 3 (three) times daily as needed (PALPITATIONS).   rosuvastatin (CRESTOR) 20 MG tablet, Take 1 tablet (20 mg total) by mouth daily.  Current Outpatient Medications (Respiratory):    albuterol (VENTOLIN HFA) 108 (90 Base) MCG/ACT inhaler, Inhale 2 puffs into the lungs every 6 (six) hours as needed for wheezing or shortness of breath.   fluticasone (FLONASE) 50 MCG/ACT nasal  spray, Place 2 sprays into both nostrils daily.   levocetirizine (XYZAL) 5 MG tablet, Take 1 tablet (5 mg total) by mouth every evening.  Current Outpatient Medications (Analgesics):    SUMAtriptan-naproxen (TREXIMET) 85-500 MG tablet, Take 1 pill as needed for migraine, if migraine persists may repeat x1 after 2 hours   Current Outpatient Medications (Other):    ALPRAZolam (XANAX) 1  MG tablet, Take 1 mg by mouth as needed for anxiety.   AMBULATORY NON FORMULARY MEDICATION, 4 Prong cane   blood glucose meter kit and supplies KIT, Dispense based on patient and insurance preference. Use up to four times daily as directed. (FOR E11.9).   hydrOXYzine (ATARAX/VISTARIL) 10 MG tablet, Take 1 tablet (10 mg total) by mouth 3 (three) times daily as needed for itching.   linaclotide (LINZESS) 290 MCG CAPS capsule, Take 1 capsule (290 mcg total) by mouth daily before breakfast.   Multiple Vitamins-Minerals (MULTIVITAMIN WITH MINERALS) tablet, Take 1 tablet by mouth daily.   omeprazole (PRILOSEC) 20 MG capsule, Take 1 capsule (20 mg total) by mouth daily.   potassium chloride SA (KLOR-CON) 20 MEQ tablet, Take 1 tablet (20 mEq total) by mouth daily. 1 tablet by mouth daily   Tart Cherry (TART CHERRY ULTRA) 1200 MG CAPS, Take by mouth.   tiZANidine (ZANAFLEX) 4 MG capsule, Take 4 mg by mouth 3 (three) times daily.   Turmeric (QC TUMERIC COMPLEX PO), Take by mouth. 1200 mg daily   venlafaxine XR (EFFEXOR-XR) 150 MG 24 hr capsule, Take 1 capsule (150 mg total) by mouth 2 (two) times daily.   Vitamin D, Ergocalciferol, (DRISDOL) 1.25 MG (50000 UNIT) CAPS capsule, 1 CAP EVERY 7 DAYS (MAY CONTAIN SOY OR PEANUT-TALK TO YOUR DOCTOR BEFORE TAKING IF YOU ARE ALLERGIC) (Patient not taking: Reported on 09/11/2021)   Reviewed prior external information including notes and imaging from  primary care provider As well as notes that were available from care everywhere and other healthcare systems.  Past medical history,  social, surgical and family history all reviewed in electronic medical record.  No pertanent information unless stated regarding to the chief complaint.   Review of Systems:  No headache, visual changes, nausea, vomiting, diarrhea, constipation, dizziness, abdominal pain, skin rash, fevers, chills, night sweats, weight loss, swollen lymph nodes, body aches, joint swelling, chest pain, shortness of breath, mood changes. POSITIVE muscle aches  Objective  There were no vitals taken for this visit.   General: No apparent distress alert and oriented x3 mood and affect normal, dressed appropriately.  HEENT: Pupils equal, extraocular movements intact  Respiratory: Patient's speak in full sentences and does not appear short of breath  Cardiovascular: No lower extremity edema, non tender, no erythema  Gait normal with good balance and coordination.  MSK:      Impression and Recommendations:     The above documentation has been reviewed and is accurate and complete Lyndal Pulley, DO

## 2021-09-19 ENCOUNTER — Encounter: Payer: Self-pay | Admitting: Internal Medicine

## 2021-09-21 ENCOUNTER — Ambulatory Visit: Payer: Medicare Other | Admitting: Family Medicine

## 2021-09-22 ENCOUNTER — Ambulatory Visit: Payer: Medicare Other | Admitting: Family Medicine

## 2021-09-28 NOTE — Progress Notes (Signed)
[place] Phone: [phone] Subjective:   Erica [name], am serving as a scribe for Dr. [name]. This visit occurred during the SARS-CoV-2 public health emergency.  Safety protocols were in place, including screening questions prior to the visit, additional usage of staff PPE, and extensive cleaning of exam room while observing appropriate contact time as indicated for disinfecting solutions.   I'm seeing this patient by the request  of:  [name], MD  CC: Right knee pain, bilateral hip pain  [id number]  06/22/2021 Repeat injection given again today.  Chronic problem with exacerbation.  Discussed which activities to do which wants to avoid.  Increase activity slowly.  Follow-up with me again 6 to 8 weeks.  Discussed the importance of weight loss some of it is in compensation for patient's knee.  Chronic problem.  Bone scan noted did not show any significant loosening.  Discussed with patient about icing regimen and home exercises.  Discussed over-the-counter medications.  Patient was unable to tolerate the Singulair.  Unable to do antihistamines.  Patient was fitted for a custom brace but has not had it actually placed yet.  Patient encouraged to call company to get this done.  Follow-up with me again in 3 months  Patient feels it slowly coming back at the moment.  Discussed with patient icing regimen, home exercises, continue to watch core strength.  We discussed different medications again but patient does not want to add too many at the moment.  Patient does have the Effexor that she is taking quite regularly.  Patient does have the Zanaflex for breakthrough.  Discussed we can repeat the epidurals and if [name] significant improvement patient will send [place] a message and we will order again.  Update 09/28/2021 Erica [name] is a 65 y.o. female coming in with complaint of R knee, R hip and back pain. Hx TKR  R knee. Patient states that she fell last week but that she is doing better this week. Mentions intermittent pain in back of knees for past month. Continues to have R knee and R hip greater trochanter pain.   Would like refill on Vit D and Zanaflex.       Past Medical History:  Diagnosis Date   Abnormal stress test    a. 09/2016: NST showed a small defect of mild severity present in the basal inferoseptal and mid inferoseptal location, consistent with ischemia. --> medically managed   ALLERGIC RHINITIS    Anxiety    Bursitis    DDD (degenerative disc disease), lumbar    ESI with Ramos (spring 2016)   Depression    Diabetes mellitus without complication (HCC)    Dyslipidemia    External hemorrhoids    GERD (gastroesophageal reflux disease)    Hypertension    Obesity    Osteoarthritis    Palpitations    a. prior event monitor showing sinus tachycardia, [name] PAF.    Panic attacks    Hx of depression   Sleep apnea    CPAP hs   Past Surgical History:  Procedure Laterality Date   ABDOMINAL HYSTERECTOMY     MASS EXCISION Left 12/30/2015   Procedure: EXCISION LEFT LEG MASS;  Surgeon: Donnie Mesa, MD;  Location: [place];  Service: General;  Laterality: Left;   REPLACEMENT TOTAL KNEE Right    RIGHT OOPHORECTOMY     [name] cancer   Social History   Socioeconomic History  Marital status: Married    Spouse name: [name]   Number of children: 3   Years of education: Not on file   Highest education level: Not on file  Occupational History   Occupation: Disabled  Tobacco Use   Smoking status: Never   Smokeless tobacco: Never   Tobacco comments:    tried to as a teenager  Vaping Use   Vaping Use: Never used  Substance and Sexual Activity   Alcohol use: Yes    Alcohol/week: 1.0 standard drink    Types: 1 Glasses of wine per week    Comment: one drink a month   Drug use: [name]   Sexual activity: Not Currently  Other Topics Concern   Not on file  Social History  Narrative   Married with children. Pt is on disablity. Previous worked in the school system   Social Determinants of Radio broadcast assistant Strain: Low Risk    Difficulty of Paying Living Expenses: Not hard at all  Food Insecurity: [name] Food Insecurity   Worried About Charity fundraiser in the Last Year: Never true   Arboriculturist in the Last Year: Never true  Transportation Needs: [name] Transportation Needs   Lack of Transportation (Medical): [name]   Lack of Transportation (Non-Medical): [name]  Physical Activity: Inactive   Days of Exercise per Week: 0 days   Minutes of Exercise per Session: 0 min  Stress: [name] Stress Concern Present   Feeling of Stress : Not at all  Social Connections: Socially Integrated   Frequency of Communication with Friends and Family: More than three times a week   Frequency of Social Gatherings with Friends and Family: Once a week   Attends Religious Services: More than 4 times per year   Active Member of [employer] or Organizations: [name]   Attends Music therapist: More than 4 times per year   Marital Status: Married   Allergies  Allergen Reactions   Seroquel [Quetiapine Fumarate]     Hallucinations, palpitations   Codeine Nausea And Vomiting   Guaifenesin-Codeine Other (See Comments)    REACTION: feels spacey   Wellbutrin [Bupropion] Palpitations    hallucinations   Family History  Problem Relation Age of Onset   Heart disease Mother        Enlarged Heart, Pacemaker   Diabetes Mother    Thyroid disease Mother    Hyperlipidemia Mother    Hypertension Mother    Sleep apnea Mother    Dementia Father    Kidney failure Father    Prostate cancer Father    Alcoholism Father    Asthma Other    Allergies Sister    Asthma Son    Breast cancer Maternal Aunt        cousin   Colon cancer Cousin    Heart disease Son        CHF, morbid obesity   Prostate cancer Maternal Uncle    Diabetes Son     Current Outpatient Medications (Endocrine &  Metabolic):    dapagliflozin propanediol (FARXIGA) 10 MG TABS tablet, Take 1 tablet (10 mg total) by mouth daily before breakfast.   Semaglutide (RYBELSUS) 3 MG TABS, Take 3 mg by mouth daily before breakfast.  Current Outpatient Medications (Cardiovascular):    atenolol (TENORMIN) 25 MG tablet, TAKE ONE TABLET BY MOUTH IN THE MORNING AND TAKE TWO TABLETS IN THE  EVENING   furosemide (LASIX) 40 MG tablet, Take 1 tablet (40 mg total)  by mouth daily.   propranolol (INDERAL) 10 MG tablet, Take 1 tablet (10 mg total) by mouth 3 (three) times daily as needed (PALPITATIONS).   rosuvastatin (CRESTOR) 20 MG tablet, Take 1 tablet (20 mg total) by mouth daily.  Current Outpatient Medications (Respiratory):    albuterol (VENTOLIN HFA) 108 (90 Base) MCG/ACT inhaler, Inhale 2 puffs into the lungs every 6 (six) hours as needed for wheezing or shortness of breath.   fluticasone (FLONASE) 50 MCG/ACT nasal spray, Place 2 sprays into both nostrils daily.   levocetirizine (XYZAL) 5 MG tablet, Take 1 tablet (5 mg total) by mouth every evening.  Current Outpatient Medications (Analgesics):    SUMAtriptan-naproxen (TREXIMET) 85-500 MG tablet, Take 1 pill as needed for migraine, if migraine persists may repeat x1 after 2 hours   Current Outpatient Medications (Other):    tiZANidine (ZANAFLEX) 4 MG tablet, Take 1 tablet (4 mg total) by mouth at bedtime.   ALPRAZolam (XANAX) 1 MG tablet, Take 1 mg by mouth as needed for anxiety.   AMBULATORY NON FORMULARY MEDICATION, 4 Prong cane   blood glucose meter kit and supplies KIT, Dispense based on patient and insurance preference. Use up to four times daily as directed. (FOR E11.9).   hydrOXYzine (ATARAX/VISTARIL) 10 MG tablet, Take 1 tablet (10 mg total) by mouth 3 (three) times daily as needed for itching.   linaclotide (LINZESS) 290 MCG CAPS capsule, Take 1 capsule (290 mcg total) by mouth daily before breakfast.   Multiple Vitamins-Minerals (MULTIVITAMIN WITH  MINERALS) tablet, Take 1 tablet by mouth daily.   omeprazole (PRILOSEC) 20 MG capsule, Take 1 capsule (20 mg total) by mouth daily.   potassium chloride SA (KLOR-CON) 20 MEQ tablet, Take 1 tablet (20 mEq total) by mouth daily. 1 tablet by mouth daily   Tart Cherry (TART CHERRY ULTRA) 1200 MG CAPS, Take by mouth.   Turmeric (QC TUMERIC COMPLEX PO), Take by mouth. 1200 mg daily   venlafaxine XR (EFFEXOR-XR) 150 MG 24 hr capsule, Take 1 capsule (150 mg total) by mouth 2 (two) times daily.   Vitamin D, Ergocalciferol, (DRISDOL) 1.25 MG (50000 UNIT) CAPS capsule, 1 CAP EVERY 7 DAYS (MAY CONTAIN SOY OR PEANUT-TALK TO YOUR DOCTOR BEFORE TAKING IF YOU ARE ALLERGIC) (Patient not taking: Reported on 09/11/2021)    Review of Systems:  [name] headache, visual changes, nausea, vomiting, diarrhea, constipation, dizziness, abdominal pain, skin rash, fevers, chills, night sweats, weight loss, swollen lymph nodes, joint swelling, chest pain, shortness of breath, mood changes. POSITIVE muscle aches, body aches  Objective  Blood pressure 110/70, pulse 73, height '5\' 7"'  (1.702 m), weight 248 lb (112.5 kg), SpO2 98 %.   General: [name] apparent distress alert and oriented x3 mood and affect normal, dressed appropriately.  HEENT: Pupils equal, extraocular movements intact  Respiratory: Patient's speak in full sentences and does not appear short of breath  Cardiovascular: trace  lower extremity edema, non tender, [name] erythema varicose veins noted. Gait antalgic MSK: Patient is tenderness to palpation of the greater trochanteric area bilaterally.  Patient does have positive [place]y Sox.  Negative straight leg test.  Patient continues to have pain in the calves bilaterally. After verbal consent patient was prepped in sterile fashion with alcohol swabs. Ethyl chloride used patient was injected with a 22-gauge 3 inch needle into the RIGHT lateral hip in the greater trochanteric area Picture was taken. Patient had 4 cc of 0.5% Marcaine  and 1 cc of Kenalog 40 mg/dL injected. Patient tolerated the procedure well  and [name] blood loss. Pain completely resolved after injection stating proper placement. Post injection instructions given.  After verbal consent patient was prepped with alcohol swab and with a 22-gauge 3 inch needle injected into the left greater trochanteric area with a total of 0.5 cc of 0.5% Marcaine and 1 cc of Kenalog 40 mg/mL.  [name] blood loss.  Band-Aid placed.  Postinjection instructions given    Impression and Recommendations:     The above documentation has been reviewed and is accurate and complete Lyndal Pulley, DO

## 2021-09-29 ENCOUNTER — Encounter: Payer: Self-pay | Admitting: Family Medicine

## 2021-09-29 ENCOUNTER — Other Ambulatory Visit: Payer: Self-pay

## 2021-09-29 ENCOUNTER — Ambulatory Visit (INDEPENDENT_AMBULATORY_CARE_PROVIDER_SITE_OTHER): Payer: Medicare Other | Admitting: Family Medicine

## 2021-09-29 VITALS — BP 110/70 | HR 73 | Ht 67.0 in | Wt 248.0 lb

## 2021-09-29 DIAGNOSIS — M7062 Trochanteric bursitis, left hip: Secondary | ICD-10-CM

## 2021-09-29 DIAGNOSIS — M2550 Pain in unspecified joint: Secondary | ICD-10-CM

## 2021-09-29 DIAGNOSIS — M7061 Trochanteric bursitis, right hip: Secondary | ICD-10-CM

## 2021-09-29 DIAGNOSIS — Z96651 Presence of right artificial knee joint: Secondary | ICD-10-CM

## 2021-09-29 DIAGNOSIS — G8929 Other chronic pain: Secondary | ICD-10-CM

## 2021-09-29 DIAGNOSIS — I773 Arterial fibromuscular dysplasia: Secondary | ICD-10-CM

## 2021-09-29 DIAGNOSIS — M25561 Pain in right knee: Secondary | ICD-10-CM

## 2021-09-29 DIAGNOSIS — I8393 Asymptomatic varicose veins of bilateral lower extremities: Secondary | ICD-10-CM | POA: Diagnosis not present

## 2021-09-29 DIAGNOSIS — E538 Deficiency of other specified B group vitamins: Secondary | ICD-10-CM | POA: Diagnosis not present

## 2021-09-29 LAB — COMPREHENSIVE METABOLIC PANEL
ALT: 12 U/L (ref 0–35)
AST: 17 U/L (ref 0–37)
Albumin: 3.8 g/dL (ref 3.5–5.2)
Alkaline Phosphatase: 123 U/L — ABNORMAL HIGH (ref 39–117)
BUN: 11 mg/dL (ref 6–23)
CO2: 30 mEq/L (ref 19–32)
Calcium: 10.2 mg/dL (ref 8.4–10.5)
Chloride: 105 mEq/L (ref 96–112)
Creatinine, Ser: 0.79 mg/dL (ref 0.40–1.20)
GFR: 77.18 mL/min (ref >60.00)
Glucose, Bld: 100 mg/dL — ABNORMAL HIGH (ref 70–99)
Potassium: 4.3 mEq/L (ref 3.5–5.1)
Sodium: 141 mEq/L (ref 135–145)
Total Bilirubin: 0.3 mg/dL (ref 0.2–1.2)
Total Protein: 7 g/dL (ref 6.0–8.3)

## 2021-09-29 LAB — VITAMIN D 25 HYDROXY (VIT D DEFICIENCY, FRACTURES): VITD: 37.76 ng/mL (ref 30.00–100.00)

## 2021-09-29 MED ORDER — CYANOCOBALAMIN 1000 MCG/ML IJ SOLN
1000.0000 ug | Freq: Once | INTRAMUSCULAR | Status: AC
  Administered 2021-09-29: 10:00:00 1000 ug via INTRAMUSCULAR

## 2021-09-29 MED ORDER — TIZANIDINE HCL 4 MG PO TABS: 4.0000 mg | ORAL_TABLET | Freq: Every day | ORAL | 0 refills | Status: DC

## 2021-09-29 NOTE — Assessment & Plan Note (Signed)
Patient does have fibromuscular dysplasia noted on the most recent testing.  Concerned that patient could have more of the peripheral aspect of this as well.  Would normally do ABIs as well as Doppler testing but will refer to vascular to see if there is more appropriate testing.  Depending on findings this could be one of the causes of the lower extremity pain that she gets.  Otherwise we will need to look more at the lumbar aspect.

## 2021-09-29 NOTE — Assessment & Plan Note (Signed)
B12 injection given today. 

## 2021-09-29 NOTE — Assessment & Plan Note (Signed)
Bilateral injections given today, tolerated the procedure well, discussed icing regimen and home exercise, discussed which activities to do which wants to avoid.  Encourage patient to work on hip abductor strengthening as well as weight loss.  Follow-up with me again in 6 to 8 weeks.

## 2021-09-29 NOTE — Patient Instructions (Addendum)
[email or web address], [employer], [email or web address] Vit D  Injection Today Labs today See you again in 8 weeks

## 2021-10-01 ENCOUNTER — Other Ambulatory Visit: Payer: Self-pay

## 2021-10-01 MED ORDER — VITAMIN D (ERGOCALCIFEROL) 1.25 MG (50000 UNIT) PO CAPS: 50000.0000 [IU] | ORAL_CAPSULE | ORAL | 0 refills | Status: DC

## 2021-10-01 NOTE — Progress Notes (Signed)
Once weekly Vit D called in.

## 2021-10-16 DIAGNOSIS — B3731 Acute candidiasis of vulva and vagina: Secondary | ICD-10-CM | POA: Diagnosis not present

## 2021-10-16 DIAGNOSIS — G4733 Obstructive sleep apnea (adult) (pediatric): Secondary | ICD-10-CM | POA: Diagnosis not present

## 2021-10-26 ENCOUNTER — Ambulatory Visit
Admission: EM | Admit: 2021-10-26 | Discharge: 2021-10-26 | Disposition: A | Discharge status: Home / Self Care | Payer: Medicare Other | Attending: Physician Assistant | Admitting: Physician Assistant

## 2021-10-26 ENCOUNTER — Ambulatory Visit (INDEPENDENT_AMBULATORY_CARE_PROVIDER_SITE_OTHER): Payer: Medicare Other

## 2021-10-26 ENCOUNTER — Encounter: Payer: Self-pay | Admitting: Emergency Medicine

## 2021-10-26 ENCOUNTER — Other Ambulatory Visit: Payer: Self-pay

## 2021-10-26 DIAGNOSIS — R0781 Pleurodynia: Secondary | ICD-10-CM | POA: Diagnosis not present

## 2021-10-26 DIAGNOSIS — R0782 Intercostal pain: Secondary | ICD-10-CM

## 2021-10-26 DIAGNOSIS — M79652 Pain in left thigh: Secondary | ICD-10-CM

## 2021-10-26 DIAGNOSIS — W19XXXA Unspecified fall, initial encounter: Secondary | ICD-10-CM | POA: Diagnosis not present

## 2021-10-26 NOTE — ED Triage Notes (Signed)
Sunday (10/18/2021), 1 week prior, pt fell into some chairs at church. Left rib and upper left thigh bruised. Pt is concerned about the firmness of the bruise on her thigh. Has been taking tizanidine without improvement.

## 2021-10-26 NOTE — ED Provider Notes (Signed)
[facility]    CSN: [id number] Arrival date & time: 10/26/21  1224      History   Chief Complaint Chief Complaint  Patient presents with   Fall   Bleeding/Bruising    HPI Erica [name] is a 66 y.o. female.   Patient here today for evaluation of left rib pain and upper left thigh bruising that occurred after a fall about a week ago.  She states that she was at a convention with her church and had a fold-down seat that she accidentally missed and fell hitting her left side.  She reports that left rib pain seems to be worse with deep breathing and palpation but denies any shortness of breath.  The bruise on her thigh just seems to be growing somewhat in size.  She has been using tizanidine without improvement  The history is provided by the patient.  Fall Associated symptoms include chest pain (rib pain). Pertinent negatives include no abdominal pain and no shortness of breath.  Past Medical History:  Diagnosis Date   Abnormal stress test    a. 09/2016: NST showed a small defect of mild severity present in the basal inferoseptal and mid inferoseptal location, consistent with ischemia. --> medically managed   ALLERGIC RHINITIS    Anxiety    Bursitis    DDD (degenerative disc disease), lumbar    ESI with Ramos (spring 2016)   Depression    Diabetes mellitus without complication (HCC)    Dyslipidemia    External hemorrhoids    GERD (gastroesophageal reflux disease)    Hypertension    Obesity    Osteoarthritis    Palpitations    a. prior event monitor showing sinus tachycardia, no PAF.    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[place]) 08/10/2021   Urge incontinence of urine 08/10/2021   B12 deficiency 02/05/2021   Morbid obesity ([place]) 12/08/2020   Greater trochanteric bursitis of both hips 08/25/2020   Greater  trochanteric bursitis of right hip 05/05/2020   Chronic knee pain after total replacement of right knee joint 01/14/2020   Chronic constipation 08/23/2019   Abdominal pain 08/23/2019   Bilateral carotid artery stenosis 06/04/2019   Osteopenia 05/19/2019   Left knee pain 12/11/2018   Class 2 severe obesity with serious comorbidity and body mass index (BMI) of 37.0 to 37.9 in adult [facility]) 08/31/2018   Degeneration of lumbar intervertebral disc 05/30/2018   Onychomycosis 12/07/2017   Left hip pain 06/12/2017   Acute left-sided low back pain without sciatica 06/12/2017   Meningioma ([place]) 03/28/2017   Panic attacks 09/27/2016   GERD (gastroesophageal reflux disease) 09/27/2016   Right ankle pain 08/19/2016   Diabetes ([place]) 02/15/2016   Lump of skin 11/28/2015   Migraine without aura and with status migrainosus, not intractable 10/03/2015   Numbness of left hand 10/03/2015   Cephalalgia 10/03/2015   Palpitations 09/08/2012   Sciatica of left side    Peripheral edema 05/09/2012   FATIGUE 02/23/2010   Obstructive sleep apnea 10/31/2009   External hemorrhoids 10/17/2009   Dyslipidemia 01/12/2008   Depression, major, recurrent ([place]) 01/12/2008   Essential hypertension, benign 01/12/2008   Allergic rhinitis 01/12/2008    Past Surgical History:  Procedure Laterality Date   ABDOMINAL HYSTERECTOMY     MASS EXCISION Left 12/30/2015  Procedure: EXCISION LEFT LEG MASS;  Surgeon: Donnie Mesa, MD;  Location: [place];  Service: General;  Laterality: Left;   REPLACEMENT TOTAL KNEE Right    RIGHT OOPHORECTOMY     No cancer    OB History     Gravida  3   Para  3   Term  3   Preterm      AB      Living  3      SAB      IAB      Ectopic      Multiple      Live Births               Home Medications    Prior to Admission medications   Medication Sig Start Date End Date Taking? Authorizing Provider  albuterol (VENTOLIN HFA) 108 (90 Base) MCG/ACT  inhaler Inhale 2 puffs into the lungs every 6 (six) hours as needed for wheezing or shortness of breath. 02/06/21   Burns, Claudina Lick, MD  ALPRAZolam Duanne Moron) 1 MG tablet Take 1 mg by mouth as needed for anxiety.    [provider]  AMBULATORY NON FORMULARY MEDICATION 4 Prong cane 02/02/21   Lyndal Pulley, DO  atenolol (TENORMIN) 25 MG tablet TAKE ONE TABLET BY MOUTH IN THE MORNING AND TAKE TWO TABLETS IN THE  EVENING 09/17/21   [name], MD  blood glucose meter kit and supplies KIT Dispense based on patient and insurance preference. Use up to four times daily as directed. (FOR E11.9). 09/14/19   [name], MD  dapagliflozin propanediol (FARXIGA) 10 MG TABS tablet Take 1 tablet (10 mg total) by mouth daily before breakfast. 08/10/21   Burns, Claudina Lick, MD  fluticasone (FLONASE) 50 MCG/ACT nasal spray Place 2 sprays into both nostrils daily. 06/24/20   [name], MD  furosemide (LASIX) 40 MG tablet Take 1 tablet (40 mg total) by mouth daily. 07/03/21   Warren Lacy, PA-C  hydrOXYzine (ATARAX/VISTARIL) 10 MG tablet Take 1 tablet (10 mg total) by mouth 3 (three) times daily as needed for itching. 07/25/20   Tasia Catchings, Amy V, PA-C  levocetirizine (XYZAL) 5 MG tablet Take 1 tablet (5 mg total) by mouth every evening. 08/11/21   [name], MD  linaclotide Rolan Lipa) 290 MCG CAPS capsule Take 1 capsule (290 mcg total) by mouth daily before breakfast. 08/11/21   Burns, Claudina Lick, MD  Multiple Vitamins-Minerals (MULTIVITAMIN WITH MINERALS) tablet Take 1 tablet by mouth daily.    [provider]  omeprazole (PRILOSEC) 20 MG capsule Take 1 capsule (20 mg total) by mouth daily. 08/11/21   [name], MD  potassium chloride SA (KLOR-CON) 20 MEQ tablet Take 1 tablet (20 mEq total) by mouth daily. 1 tablet by mouth daily 07/03/21   Warren Lacy, PA-C  propranolol (INDERAL) 10 MG tablet Take 1 tablet (10 mg total) by mouth 3 (three) times daily as needed (PALPITATIONS). 09/19/20    Minus Breeding, MD  rosuvastatin (CRESTOR) 20 MG tablet Take 1 tablet (20 mg total) by mouth daily. 08/10/21 11/08/21  [name], MD  Semaglutide (RYBELSUS) 3 MG TABS Take 3 mg by mouth daily before breakfast. 08/10/21   Burns, Claudina Lick, MD  SUMAtriptan-naproxen (TREXIMET) 85-500 MG tablet Take 1 pill as needed for migraine, if migraine persists may repeat x1 after 2 hours 12/25/20   Burns, Claudina Lick, MD  Tart Cherry (TART CHERRY ULTRA) 1200 MG CAPS  Take by mouth.    [provider]  tiZANidine (ZANAFLEX) 4 MG tablet Take 1 tablet (4 mg total) by mouth at bedtime. 09/29/21   Lyndal Pulley, DO  Turmeric (QC TUMERIC COMPLEX PO) Take by mouth. 1200 mg daily    [provider]  venlafaxine XR (EFFEXOR-XR) 150 MG 24 hr capsule Take 1 capsule (150 mg total) by mouth 2 (two) times daily. 08/11/21   [name], MD  Vitamin D, Ergocalciferol, (DRISDOL) 1.25 MG (50000 UNIT) CAPS capsule Take 1 capsule (50,000 Units total) by mouth every 7 (seven) days. 10/01/21   Lyndal Pulley, DO  oxycodone (OXY-IR) 5 MG capsule Take 5 mg by mouth every 4 (four) hours as needed. For pain.  02/27/15  [provider]    Family History Family History  Problem Relation Age of Onset   Heart disease Mother        Enlarged Heart, Pacemaker   Diabetes Mother    Thyroid disease Mother    Hyperlipidemia Mother    Hypertension Mother    Sleep apnea Mother    Dementia Father    Kidney failure Father    Prostate cancer Father    Alcoholism Father    Asthma Other    Allergies Sister    Asthma Son    Breast cancer Maternal Aunt        cousin   Colon cancer Cousin    Heart disease Son        CHF, morbid obesity   Prostate cancer Maternal Uncle    Diabetes Son     Social History Social History   Tobacco Use   Smoking status: Never   Smokeless tobacco: Never   Tobacco comments:    tried to as a teenager  Media planner   Vaping Use: Never used  Substance Use Topics   Alcohol use:  Yes    Alcohol/week: 1.0 standard drink    Types: 1 Glasses of wine per week    Comment: one drink a month   Drug use: No     Allergies   Seroquel [quetiapine fumarate], Codeine, Guaifenesin-codeine, and Wellbutrin [bupropion]   Review of Systems Review of Systems  Constitutional:  Negative for chills and fever.  Eyes:  Negative for discharge and redness.  Respiratory:  Negative for shortness of breath.   Cardiovascular:  Positive for chest pain (rib pain).  Gastrointestinal:  Negative for abdominal pain, nausea and vomiting.  Genitourinary:  Positive for vaginal bleeding and vaginal discharge.  Musculoskeletal:  Positive for myalgias. Negative for back pain.  Skin:  Positive for color change. Negative for wound.    Physical Exam Triage Vital Signs ED Triage Vitals  Enc Vitals Group     BP 10/26/21 1337 128/87     Pulse Rate 10/26/21 1337 71     Resp 10/26/21 1337 16     Temp 10/26/21 1337 98.6 F (37 C)     Temp Source 10/26/21 1337 Oral     SpO2 10/26/21 1337 98 %     Weight --      Height --      Head Circumference --      Peak Flow --      Pain Score 10/26/21 1346 8     Pain Loc --      Pain Edu? --      Excl. in [place]? --    No data found.  Updated Vital Signs BP 128/87 (BP Location: Left Arm)  Pulse 71    Temp 98.6 F (37 C) (Oral)    Resp 16    SpO2 98%     Physical Exam Vitals and nursing note reviewed.  Constitutional:      General: She is not in acute distress.    Appearance: Normal appearance. She is not ill-appearing.  HENT:     Head: Normocephalic and atraumatic.  Eyes:     Conjunctiva/sclera: Conjunctivae normal.  Cardiovascular:     Rate and Rhythm: Normal rate and regular rhythm.     Heart sounds: Normal heart sounds. No murmur heard. Pulmonary:     Effort: Pulmonary effort is normal. No respiratory distress.     Breath sounds: Normal breath sounds. No wheezing, rhonchi or rales.  Chest:     Chest wall: Tenderness (mild TTP to left  lower ribs) present.  Skin:    Comments: Bruising noted to left lateral upper thigh  Neurological:     Mental Status: She is alert.  Psychiatric:        Mood and Affect: Mood normal.        Behavior: Behavior normal.        Thought Content: Thought content normal.     UC Treatments / Results  Labs (all labs ordered are listed, but only abnormal results are displayed) Labs Reviewed - No data to display  EKG   Radiology DG Ribs Unilateral W/Chest Left  Result Date: 10/26/2021 CLINICAL DATA:  LEFT rib pain after fall EXAM: LEFT RIBS AND CHEST - 3+ VIEW COMPARISON:  None. FINDINGS: Normal mediastinum and cardiac silhouette. Normal pulmonary vasculature. No evidence of effusion, infiltrate, or pneumothorax. No acute bony abnormality. Dedicated views LEFT ribs demonstrate no displaced fracture. IMPRESSION: 1. No LEFT rib fracture. 2. No pneumothorax. Electronically Signed   By: Suzy Bouchard M.D.   On: 10/26/2021 14:37    Procedures Procedures (including critical care time)  Medications Ordered in UC Medications - No data to display  Initial Impression / Assessment and Plan / UC Course  I have reviewed the triage vital signs and the nursing notes.  Pertinent labs & imaging results that were available during my care of the patient were reviewed by me and considered in my medical decision making (see chart for details).  X-ray without fracture.  Reassured bruising should resolve with time.  Recommended symptomatic treatment if needed and follow-up if symptoms fail to improve with time or worsen in any way.  Final Clinical Impressions(s) / UC Diagnoses   Final diagnoses:  Fall, initial encounter  Rib pain on left side  Left thigh pain   Discharge Instructions   None    ED Prescriptions   None    PDMP not reviewed this encounter.   Francene Finders, PA-C 10/27/21 1214

## 2021-10-27 ENCOUNTER — Other Ambulatory Visit: Payer: Self-pay

## 2021-10-27 DIAGNOSIS — I83893 Varicose veins of bilateral lower extremities with other complications: Secondary | ICD-10-CM

## 2021-11-01 NOTE — Progress Notes (Signed)
Cardiology Office Note   Date:  11/02/2021   ID:  Deloma, Spindle Jun 27, 1956, MRN [id number]  PCP:  [name], MD  Cardiologist:   Minus Breeding, MD  Chief Complaint  Patient presents with   Palpitations     History of Present Illness:  Erica [name] is a 66 y.o. female who presents for follow up of palpitations.  She was in the emergency room on 7/31 for this.  It turns out there was a medication error.  She had run out of Xanax and was actually taking Seroquel.  Her EKG was unremarkable.  High-sensitivity troponin was normal.  There was not thought to be any cardiac etiology.   She was in the ED on 11/11 with dyspnea.  I reviewed these records for this visit.    CXR was normal.  BNP was normal.  She had palpitations.  She wore a monitor but only for about 27 hours.    Since I last saw her she fell and she had some injury to her left side under her left breast.  She had some sharp discomfort associated with this.  She has been limited because of knee pain.  However, she is not describing substernal chest pain.  She is not having new palpitations and has not actually been using the propranolol as needed that she has.  She has had no presyncope or syncope.  She has had some pain behind both knees and her primary has referred her to apparently vascular surgery.   Past Medical History:  Diagnosis Date   Abnormal stress test    a. 09/2016: NST showed a small defect of mild severity present in the basal inferoseptal and mid inferoseptal location, consistent with ischemia. --> medically managed   ALLERGIC RHINITIS    Anxiety    Bursitis    DDD (degenerative disc disease), lumbar    ESI with Ramos (spring 2016)   Depression    Diabetes mellitus without complication (HCC)    Dyslipidemia    External hemorrhoids    GERD (gastroesophageal reflux disease)    Hypertension    Obesity    Osteoarthritis    Palpitations    a. prior event monitor showing sinus  tachycardia, no PAF.    Panic attacks    Hx of depression   Sleep apnea    CPAP hs    Past Surgical History:  Procedure Laterality Date   ABDOMINAL HYSTERECTOMY     MASS EXCISION Left 12/30/2015   Procedure: EXCISION LEFT LEG MASS;  Surgeon: Donnie Mesa, MD;  Location: [place];  Service: General;  Laterality: Left;   REPLACEMENT TOTAL KNEE Right    RIGHT OOPHORECTOMY     No cancer     Current Outpatient Medications  Medication Sig Dispense Refill   albuterol (VENTOLIN HFA) 108 (90 Base) MCG/ACT inhaler Inhale 2 puffs into the lungs every 6 (six) hours as needed for wheezing or shortness of breath. 16 g 3   ALPRAZolam (XANAX) 1 MG tablet Take 1 mg by mouth as needed for anxiety.     AMBULATORY NON FORMULARY MEDICATION 4 Prong cane 1 Units 0   atenolol (TENORMIN) 25 MG tablet TAKE ONE TABLET BY MOUTH IN THE MORNING AND TAKE TWO TABLETS IN THE  EVENING 270 tablet 2   blood glucose meter kit and supplies KIT Dispense based on patient and insurance preference. Use up to four times daily as directed. (FOR E11.9). 1 each 0  dapagliflozin propanediol (FARXIGA) 10 MG TABS tablet Take 1 tablet (10 mg total) by mouth daily before breakfast. 90 tablet 1   fluticasone (FLONASE) 50 MCG/ACT nasal spray Place 2 sprays into both nostrils daily. 16 g 0   furosemide (LASIX) 40 MG tablet Take 1 tablet (40 mg total) by mouth daily. 180 tablet 1   levocetirizine (XYZAL) 5 MG tablet Take 1 tablet (5 mg total) by mouth every evening. 90 tablet 2   linaclotide (LINZESS) 290 MCG CAPS capsule Take 1 capsule (290 mcg total) by mouth daily before breakfast. 90 capsule 2   Multiple Vitamins-Minerals (MULTIVITAMIN WITH MINERALS) tablet Take 1 tablet by mouth daily.     omeprazole (PRILOSEC) 20 MG capsule Take 1 capsule (20 mg total) by mouth daily. 90 capsule 2   potassium chloride SA (KLOR-CON) 20 MEQ tablet Take 1 tablet (20 mEq total) by mouth daily. 1 tablet by mouth daily 90 tablet 3    rosuvastatin (CRESTOR) 20 MG tablet Take 1 tablet (20 mg total) by mouth daily. 90 tablet 3   Semaglutide (RYBELSUS) 3 MG TABS Take 3 mg by mouth daily before breakfast. 90 tablet 1   Tart Cherry (TART CHERRY ULTRA) 1200 MG CAPS Take by mouth.     tiZANidine (ZANAFLEX) 4 MG tablet Take 1 tablet (4 mg total) by mouth at bedtime. 30 tablet 0   Turmeric (QC TUMERIC COMPLEX PO) Take by mouth. 1200 mg daily     venlafaxine XR (EFFEXOR-XR) 150 MG 24 hr capsule Take 1 capsule (150 mg total) by mouth 2 (two) times daily. 180 capsule 2   Vitamin D, Ergocalciferol, (DRISDOL) 1.25 MG (50000 UNIT) CAPS capsule Take 1 capsule (50,000 Units total) by mouth every 7 (seven) days. 12 capsule 0   hydrOXYzine (ATARAX/VISTARIL) 10 MG tablet Take 1 tablet (10 mg total) by mouth 3 (three) times daily as needed for itching. (Patient not taking: Reported on 11/02/2021) 15 tablet 0   propranolol (INDERAL) 10 MG tablet Take 1 tablet (10 mg total) by mouth 3 (three) times daily as needed (PALPITATIONS). (Patient not taking: Reported on 11/02/2021) 60 tablet 3   SUMAtriptan-naproxen (TREXIMET) 85-500 MG tablet Take 1 pill as needed for migraine, if migraine persists may repeat x1 after 2 hours (Patient not taking: Reported on 11/02/2021) 10 tablet 8   No current facility-administered medications for this visit.    Allergies:   Seroquel [quetiapine fumarate], Codeine, Guaifenesin-codeine, and Wellbutrin [bupropion]    ROS:  Please see the history of present illness.   Otherwise, review of systems are positive for none.  All other systems are reviewed and negative.    PHYSICAL EXAM: VS:  BP 118/80 (BP Location: Left Arm, Patient Position: Sitting, Cuff Size: Large)    Ht '5\' 6"'  (1.676 m)    Wt 248 lb (112.5 kg)    SpO2 96%    BMI 40.03 kg/m  , BMI Body mass index is 40.03 kg/m. GENERAL:  Well appearing NECK:  No jugular venous distention, waveform within normal limits, carotid upstroke brisk and symmetric, no bruits, no  thyromegaly LUNGS:  Clear to auscultation bilaterally CHEST:  Unremarkable HEART:  PMI not displaced or sustained,S1 and S2 within normal limits, no S3, no S4, no clicks, no rubs, no murmurs ABD:  Flat, positive bowel sounds normal in frequency in pitch, no bruits, no rebound, no guarding, no midline pulsatile mass, no hepatomegaly, no splenomegaly EXT:  2 plus pulses throughout, no edema, no cyanosis no clubbing  EKG:  EKG  is not ordered today.   Recent Labs: 02/02/2021: Hemoglobin 12.1; Platelets 287.0; TSH 2.26 09/29/2021: ALT 12; BUN 11; Creatinine, Ser 0.79; Potassium 4.3; Sodium 141    Lipid Panel    Component Value Date/Time   CHOL 140 08/10/2021 1217   CHOL 183 02/09/2018 1053   TRIG 104.0 08/10/2021 1217   TRIG 51 09/20/2006 0915   HDL 47.90 08/10/2021 1217   HDL 55 02/09/2018 1053   CHOLHDL 3 08/10/2021 1217   VLDL 20.8 08/10/2021 1217   LDLCALC 71 08/10/2021 1217   LDLCALC 86 06/02/2020 1013      Wt Readings from Last 3 Encounters:  11/02/21 248 lb (112.5 kg)  09/29/21 248 lb (112.5 kg)  08/10/21 248 lb (112.5 kg)      Other studies Reviewed: Additional studies/ records that were reviewed today include: Labs Review of the above records demonstrates: See elsewhere   ASSESSMENT AND PLAN:   PALPITATIONS:  She has had no further palpitations.  She has her as needed propranolol should she need it.   HTN Her blood pressure is controlled.  No change in therapy.    HLD LDL was 71 with an HDL of 48.  No change in therapy.  CAROTID STENOSIS She had 40 - 59% stenosis on both sides.  She will have follow-up next year in September.    Current medicines are reviewed at length with the patient today.  The patient does not have concerns regarding medicines.  The following changes have been made:   None  Labs/ tests ordered today include:  None  No orders of the defined types were placed in this encounter.    Disposition:   FU with me in one year.     Signed, Minus Breeding, MD  11/02/2021 10:43 AM    [facility]

## 2021-11-02 ENCOUNTER — Encounter: Payer: Self-pay | Admitting: Cardiology

## 2021-11-02 ENCOUNTER — Other Ambulatory Visit: Payer: Self-pay

## 2021-11-02 ENCOUNTER — Ambulatory Visit (INDEPENDENT_AMBULATORY_CARE_PROVIDER_SITE_OTHER): Payer: Medicare Other | Admitting: Cardiology

## 2021-11-02 VITALS — BP 118/80 | Ht 66.0 in | Wt 248.0 lb

## 2021-11-02 DIAGNOSIS — I10 Essential (primary) hypertension: Secondary | ICD-10-CM

## 2021-11-02 DIAGNOSIS — I6523 Occlusion and stenosis of bilateral carotid arteries: Secondary | ICD-10-CM

## 2021-11-02 DIAGNOSIS — E785 Hyperlipidemia, unspecified: Secondary | ICD-10-CM

## 2021-11-02 DIAGNOSIS — R002 Palpitations: Secondary | ICD-10-CM | POA: Diagnosis not present

## 2021-11-02 NOTE — Patient Instructions (Signed)
Medication Instructions:  Your physician recommends that you continue on your current medications as directed. Please refer to the Current Medication list given to you today.  *If you need a refill on your cardiac medications before your next appointment, please call your pharmacy*  Lab Work: NONE  Testing/Procedures: NONE  Follow-Up: At [facility], you and your health needs are our priority.  As part of our continuing mission to provide you with exceptional heart care, we have created designated Provider Care Teams.  These Care Teams include your primary Cardiologist (physician) and Advanced Practice Providers (APPs -  Physician Assistants and Nurse Practitioners) who all work together to provide you with the care you need, when you need it.  We recommend signing up for the patient portal called "MyChart".  Sign up information is provided on this After Visit Summary.  MyChart is used to connect with patients for Virtual Visits (Telemedicine).  Patients are able to view lab/test results, encounter notes, upcoming appointments, etc.  Non-urgent messages can be sent to your provider as well.   To learn more about what you can do with MyChart, go to [email or web address].    Your next appointment:   12 month(s)  The format for your next appointment:   In Person  Provider:   Minus Breeding, MD   Other Instructions OMRON BLOOD PRESSURE CUFF IS RECOMMENDED

## 2021-11-04 DIAGNOSIS — G4733 Obstructive sleep apnea (adult) (pediatric): Secondary | ICD-10-CM | POA: Diagnosis not present

## 2021-11-09 NOTE — Progress Notes (Signed)
[facility] [place]  ASSESSMENT / PLAN: Erica [name] is a 66 y.o. female with bilateral posterior knee pain.  I do not think this is caused by a vascular problem.  She does have venous reflux in her right lower extremity, but her symptoms are not typical of venous insufficiency. She has no evidence of peripheral arterial disease on exam. She can follow up with me as needed.  CHIEF COMPLAINT: bilateral posterior knee pain  HISTORY OF PRESENT ILLNESS: Erica [name] is a 66 y.o. female who presents to clinic for evaluation of bilateral posterior knee pain.  She has a strong history of osteoarthritis.  She reports pain in the posterior knees when standing, or ambulating.  She does not have a classic symptom history of claudication.  She does not report symptoms of ischemic rest pain in her feet.  She has no ulcers about her feet.  She does not report significant swelling in her lower extremities.  Past Medical History:  Diagnosis Date   Abnormal stress test    a. 09/2016: NST showed a small defect of mild severity present in the basal inferoseptal and mid inferoseptal location, consistent with ischemia. --> medically managed   ALLERGIC RHINITIS    Anxiety    Bursitis    DDD (degenerative disc disease), lumbar    ESI with Ramos (spring 2016)   Depression    Diabetes mellitus without complication (HCC)    Dyslipidemia    External hemorrhoids    GERD (gastroesophageal reflux disease)    Hypertension    Obesity    Osteoarthritis    Palpitations    a. prior event monitor showing sinus tachycardia, no PAF.    Panic attacks    Hx of depression   Sleep apnea    CPAP hs    Past Surgical History:  Procedure Laterality Date   ABDOMINAL HYSTERECTOMY     MASS EXCISION Left 12/30/2015   Procedure: EXCISION LEFT LEG MASS;  Surgeon: Donnie Mesa, MD;  Location: [place] [facility];  Service: General;  Laterality: Left;   REPLACEMENT TOTAL KNEE Right     RIGHT OOPHORECTOMY     No cancer    Family History  Problem Relation Age of Onset   Heart disease Mother        Enlarged Heart, Pacemaker   Diabetes Mother    Thyroid disease Mother    Hyperlipidemia Mother    Hypertension Mother    Sleep apnea Mother    Dementia Father    Kidney failure Father    Prostate cancer Father    Alcoholism Father    Asthma Other    Allergies Sister    Asthma Son    Breast cancer Maternal Aunt        cousin   Colon cancer Cousin    Heart disease Son        CHF, morbid obesity   Prostate cancer Maternal Uncle    Diabetes Son     Social History   Socioeconomic History   Marital status: Married    Spouse name: [name]   Number of children: 3   Years of education: Not on file   Highest education level: Not on file  Occupational History   Occupation: Disabled  Tobacco Use   Smoking status: Never   Smokeless tobacco: Never   Tobacco comments:    tried to as a teenager  Scientific laboratory technician Use: Never used  Substance and Sexual Activity  Alcohol use: Yes    Alcohol/week: 1.0 standard drink    Types: 1 Glasses of wine per week    Comment: one drink a month   Drug use: No   Sexual activity: Not Currently  Other Topics Concern   Not on file  Social History Narrative   Married with children. Pt is on disablity. Previous worked in the school system   Social Determinants of Radio broadcast assistant Strain: Low Risk    Difficulty of Paying Living Expenses: Not hard at all  Food Insecurity: No Food Insecurity   Worried About Charity fundraiser in the Last Year: Never true   Arboriculturist in the Last Year: Never true  Transportation Needs: No Transportation Needs   Lack of Transportation (Medical): No   Lack of Transportation (Non-Medical): No  Physical Activity: Inactive   Days of Exercise per Week: 0 days   Minutes of Exercise per Session: 0 min  Stress: No Stress Concern Present   Feeling of Stress : Not at all  Social  Connections: Socially Integrated   Frequency of Communication with Friends and Family: More than three times a week   Frequency of Social Gatherings with Friends and Family: Once a week   Attends Religious Services: More than 4 times per year   Active Member of [employer] or Organizations: No   Attends Music therapist: More than 4 times per year   Marital Status: Married  Human resources officer Violence: Not on file    Allergies  Allergen Reactions   Seroquel [Quetiapine Fumarate]     Hallucinations, palpitations   Codeine Nausea And Vomiting   Guaifenesin-Codeine Other (See Comments)    REACTION: feels spacey   Wellbutrin [Bupropion] Palpitations    hallucinations    Current Outpatient Medications  Medication Sig Dispense Refill   albuterol (VENTOLIN HFA) 108 (90 Base) MCG/ACT inhaler Inhale 2 puffs into the lungs every 6 (six) hours as needed for wheezing or shortness of breath. 16 g 3   ALPRAZolam (XANAX) 1 MG tablet Take 1 mg by mouth as needed for anxiety.     AMBULATORY NON FORMULARY MEDICATION 4 Prong cane 1 Units 0   atenolol (TENORMIN) 25 MG tablet TAKE ONE TABLET BY MOUTH IN THE MORNING AND TAKE TWO TABLETS IN THE  EVENING 270 tablet 2   blood glucose meter kit and supplies KIT Dispense based on patient and insurance preference. Use up to four times daily as directed. (FOR E11.9). 1 each 0   dapagliflozin propanediol (FARXIGA) 10 MG TABS tablet Take 1 tablet (10 mg total) by mouth daily before breakfast. 90 tablet 1   fluticasone (FLONASE) 50 MCG/ACT nasal spray Place 2 sprays into both nostrils daily. 16 g 0   furosemide (LASIX) 40 MG tablet Take 1 tablet (40 mg total) by mouth daily. 180 tablet 1   hydrOXYzine (ATARAX/VISTARIL) 10 MG tablet Take 1 tablet (10 mg total) by mouth 3 (three) times daily as needed for itching. (Patient not taking: Reported on 11/02/2021) 15 tablet 0   levocetirizine (XYZAL) 5 MG tablet Take 1 tablet (5 mg total) by mouth every evening. 90  tablet 2   linaclotide (LINZESS) 290 MCG CAPS capsule Take 1 capsule (290 mcg total) by mouth daily before breakfast. 90 capsule 2   Multiple Vitamins-Minerals (MULTIVITAMIN WITH MINERALS) tablet Take 1 tablet by mouth daily.     omeprazole (PRILOSEC) 20 MG capsule Take 1 capsule (20 mg total) by mouth daily. Lorimor  capsule 2   potassium chloride SA (KLOR-CON) 20 MEQ tablet Take 1 tablet (20 mEq total) by mouth daily. 1 tablet by mouth daily 90 tablet 3   propranolol (INDERAL) 10 MG tablet Take 1 tablet (10 mg total) by mouth 3 (three) times daily as needed (PALPITATIONS). (Patient not taking: Reported on 11/02/2021) 60 tablet 3   rosuvastatin (CRESTOR) 20 MG tablet Take 1 tablet (20 mg total) by mouth daily. 90 tablet 3   Semaglutide (RYBELSUS) 3 MG TABS Take 3 mg by mouth daily before breakfast. 90 tablet 1   SUMAtriptan-naproxen (TREXIMET) 85-500 MG tablet Take 1 pill as needed for migraine, if migraine persists may repeat x1 after 2 hours (Patient not taking: Reported on 11/02/2021) 10 tablet 8   Tart Cherry (TART CHERRY ULTRA) 1200 MG CAPS Take by mouth.     tiZANidine (ZANAFLEX) 4 MG tablet Take 1 tablet (4 mg total) by mouth at bedtime. 30 tablet 0   Turmeric (QC TUMERIC COMPLEX PO) Take by mouth. 1200 mg daily     venlafaxine XR (EFFEXOR-XR) 150 MG 24 hr capsule Take 1 capsule (150 mg total) by mouth 2 (two) times daily. 180 capsule 2   Vitamin D, Ergocalciferol, (DRISDOL) 1.25 MG (50000 UNIT) CAPS capsule Take 1 capsule (50,000 Units total) by mouth every 7 (seven) days. 12 capsule 0   No current facility-administered medications for this visit.    REVIEW OF SYSTEMS:  $RemoveB'[X]'CuWwfBkA$  denotes positive finding, $RemoveBeforeDEI'[ ]'swHsyLiUbZnjWpqk$  denotes negative finding Cardiac  Comments:  Chest pain or chest pressure:    Shortness of breath upon exertion:    Short of breath when lying flat:    Irregular heart rhythm:        Vascular    Pain in calf, thigh, or hip brought on by ambulation:    Pain in feet at night that wakes you  up from your sleep:     Blood clot in your veins:    Leg swelling:         Pulmonary    Oxygen at home:    Productive cough:     Wheezing:         Neurologic    Sudden weakness in arms or legs:     Sudden numbness in arms or legs:     Sudden onset of difficulty speaking or slurred speech:    Temporary loss of vision in one eye:     Problems with dizziness:         Gastrointestinal    Blood in stool:     Vomited blood:         Genitourinary    Burning when urinating:     Blood in urine:        Psychiatric    Major depression:         Hematologic    Bleeding problems:    Problems with blood clotting too easily:        Skin    Rashes or ulcers:        Constitutional    Fever or chills:      PHYSICAL EXAM Vitals:   11/10/21 1545  BP: 128/87  Pulse: 76  Resp: 20  Temp: 98.1 F (36.7 C)  SpO2: 98%  Weight: 242 lb (109.8 kg)  Height: $Remove'5\' 6"'KbIZOBA$  (1.676 m)    Constitutional: Well appearing.  No distress.  Obese. Neurologic: CN intact. no focal findings. no sensory loss. Psychiatric:  Mood and affect symmetric and appropriate. Eyes:  No  icterus. No conjunctival pallor. Ears, nose, throat:  mucous membranes moist. Midline trachea.  Cardiac: regular rate and rhythm.  Respiratory:  unlabored. Abdominal:  soft, non-tender, non-distended.  Peripheral vascular: 2+ DP pulses Extremity: no edema. no cyanosis. no pallor.  Skin: no gangrene. no ulceration.  Lymphatic: no Stemmer's sign. no palpable lymphadenopathy.  PERTINENT LABORATORY AND RADIOLOGIC DATA  Most recent CBC CBC Latest Ref Rng & Units 02/02/2021 08/23/2019 05/11/2019  WBC 4.0 - 10.5 K/uL 7.8 7.4 7.2  Hemoglobin 12.0 - 15.0 g/dL 12.1 12.2 13.0  Hematocrit 36.0 - 46.0 % 36.7 37.2 40.8  Platelets 150.0 - 400.0 K/uL 287.0 208.0 272     Most recent CMP CMP Latest Ref Rng & Units 09/29/2021 08/10/2021 06/08/2021  Glucose 70 - 99 mg/dL 100(H) 113(H) 91  BUN 6 - 23 mg/dL 11 16 10   Creatinine 0.40 - 1.20 mg/dL  0.79 0.78 0.72  Sodium 135 - 145 mEq/L 141 139 142  Potassium 3.5 - 5.1 mEq/L 4.3 4.1 3.9  Chloride 96 - 112 mEq/L 105 102 103  CO2 19 - 32 mEq/L 30 32 23  Calcium 8.4 - 10.5 mg/dL 10.2 9.8 9.9  Total Protein 6.0 - 8.3 g/dL 7.0 7.3 -  Total Bilirubin 0.2 - 1.2 mg/dL 0.3 0.3 -  Alkaline Phos 39 - 117 U/L 123(H) 133(H) -  AST 0 - 37 U/L 17 16 -  ALT 0 - 35 U/L 12 13 -    Renal function CrCl cannot be calculated (Patient's most recent lab result is older than the maximum 21 days allowed.).  Hgb A1c MFr Bld (%)  Date Value  08/10/2021 6.5    LDL Cholesterol (Calc)  Date Value Ref Range Status  06/02/2020 86 mg/dL (calc) Final    Comment:    Reference range: <100 . Desirable range <100 mg/dL for primary prevention;   <70 mg/dL for patients with CHD or diabetic patients  with > or = 2 CHD risk factors. Marland Kitchen LDL-C is now calculated using the Martin-Hopkins  calculation, which is a validated novel method providing  better accuracy than the Friedewald equation in the  estimation of LDL-C.  Cresenciano Genre et al. Annamaria Helling. 8502;774(12): 2061-2068  (http://education.[facility].com/faq/FAQ164)    LDL Cholesterol  Date Value Ref Range Status  08/10/2021 71 0 - 99 mg/dL Final    Lower extremity reflux study:   Right:  - No evidence of deep vein thrombosis seen in the right lower extremity,  from the common femoral through the popliteal veins.  - No evidence of superficial venous thrombosis in the right lower  extremity.  - No evidence of superficial venous reflux seen in the right short  saphenous vein.  - Venous reflux is noted in the right common femoral vein.  - Venous reflux is noted in the right sapheno-femoral junction.     Left:  - No evidence of deep vein thrombosis seen in the left lower extremity,  from the common femoral through the popliteal veins.  - No evidence of superficial venous thrombosis in the left lower  extremity.  - There is no evidence of venous reflux  seen in the left lower extremity.  - No evidence of superficial venous reflux seen in the left greater  saphenous vein.  - No evidence of superficial venous reflux seen in the left short  saphenous vein.       Yevonne Aline. Stanford Breed, MD [facility] Phone Number: [phone] 11/09/2021 8:38 PM  Total time spent  on preparing this encounter including chart review, data review, collecting history, examining the patient, coordinating care for this new patient, 45 minutes.  Portions of this report may have been transcribed using voice recognition software.  Every effort has been made to ensure accuracy; however, inadvertent computerized transcription errors may still be present.

## 2021-11-10 ENCOUNTER — Other Ambulatory Visit: Payer: Self-pay

## 2021-11-10 ENCOUNTER — Ambulatory Visit (INDEPENDENT_AMBULATORY_CARE_PROVIDER_SITE_OTHER): Payer: Medicare Other | Admitting: Vascular Surgery

## 2021-11-10 ENCOUNTER — Encounter: Payer: Self-pay | Admitting: Vascular Surgery

## 2021-11-10 ENCOUNTER — Ambulatory Visit (HOSPITAL_COMMUNITY)
Admission: RE | Admit: 2021-11-10 | Discharge: 2021-11-10 | Disposition: A | Discharge status: Home / Self Care | Payer: Medicare Other | Source: Ambulatory Visit | Attending: Vascular Surgery | Admitting: Vascular Surgery

## 2021-11-10 VITALS — BP 128/87 | HR 76 | Temp 98.1°F | Resp 20 | Ht 66.0 in | Wt 242.0 lb

## 2021-11-10 DIAGNOSIS — M25561 Pain in right knee: Secondary | ICD-10-CM

## 2021-11-10 DIAGNOSIS — M25562 Pain in left knee: Secondary | ICD-10-CM | POA: Diagnosis not present

## 2021-11-10 DIAGNOSIS — G8929 Other chronic pain: Secondary | ICD-10-CM

## 2021-11-10 DIAGNOSIS — I83893 Varicose veins of bilateral lower extremities with other complications: Secondary | ICD-10-CM | POA: Insufficient documentation

## 2021-11-10 NOTE — Patient Instructions (Addendum)
°  Blood work was ordered.     Medications changes include :   increase Rybelsus to 7 mg   Your prescription(s) have been submitted to your pharmacy. Please take as directed and contact our office if you believe you are having problem(s) with the medication(s).    Please followup in 6 months

## 2021-11-10 NOTE — Progress Notes (Signed)
Subjective:    Patient ID: Erica [name], female    DOB: June 22, 1956, 66 y.o.   MRN: [id number]  This visit occurred during the SARS-CoV-2 public health emergency.  Safety protocols were in place, including screening questions prior to the visit, additional usage of staff PPE, and extensive cleaning of exam room while observing appropriate contact time as indicated for disinfecting solutions.     HPI The patient is here for follow up of their chronic medical problems, including DM, htn, hld, migraines, gerd, constipation, B12 def  We started rybelsus and farxiga in October.  We stopped metformin at that time.  She has lost some weight since she was here last.  She is thinking about ways she can change her diet and start to do more exercise.  Medications and allergies reviewed with patient and updated if appropriate.  Patient Active Problem List   Diagnosis Date Noted   COVID-19 09/11/2021   Fibromuscular dysplasia of carotid artery ([place]) 08/10/2021   Urge incontinence of urine 08/10/2021   B12 deficiency 02/05/2021   Morbid obesity ([place]) 12/08/2020   Greater trochanteric bursitis of both hips 08/25/2020   Greater trochanteric bursitis of right hip 05/05/2020   Chronic knee pain after total replacement of right knee joint 01/14/2020   Chronic constipation 08/23/2019   Abdominal pain 08/23/2019   Bilateral carotid artery stenosis 06/04/2019   Osteopenia 05/19/2019   Left knee pain 12/11/2018   Class 2 severe obesity with serious comorbidity and body mass index (BMI) of 37.0 to 37.9 in adult [facility]) 08/31/2018   Degeneration of lumbar intervertebral disc 05/30/2018   Onychomycosis 12/07/2017   Left hip pain 06/12/2017   Acute left-sided low back pain without sciatica 06/12/2017   Meningioma ([place]) 03/28/2017   Panic attacks 09/27/2016   GERD (gastroesophageal reflux disease) 09/27/2016   Diabetes ([place]) 02/15/2016   Migraine without aura and with status migrainosus, not  intractable 10/03/2015   Numbness of left hand 10/03/2015   Cephalalgia 10/03/2015   Palpitations 09/08/2012   Sciatica of left side    Peripheral edema 05/09/2012   Obstructive sleep apnea 10/31/2009   External hemorrhoids 10/17/2009   Dyslipidemia 01/12/2008   Depression, major, recurrent ([place]) 01/12/2008   Essential hypertension, benign 01/12/2008   Allergic rhinitis 01/12/2008    Current Outpatient Medications on File Prior to Visit  Medication Sig Dispense Refill   albuterol (VENTOLIN HFA) 108 (90 Base) MCG/ACT inhaler Inhale 2 puffs into the lungs every 6 (six) hours as needed for wheezing or shortness of breath. 16 g 3   ALPRAZolam (XANAX) 1 MG tablet Take 1 mg by mouth as needed for anxiety.     AMBULATORY NON FORMULARY MEDICATION 4 Prong cane 1 Units 0   atenolol (TENORMIN) 25 MG tablet TAKE ONE TABLET BY MOUTH IN THE MORNING AND TAKE TWO TABLETS IN THE  EVENING 270 tablet 2   blood glucose meter kit and supplies KIT Dispense based on patient and insurance preference. Use up to four times daily as directed. (FOR E11.9). 1 each 0   fluticasone (FLONASE) 50 MCG/ACT nasal spray Place 2 sprays into both nostrils daily. 16 g 0   furosemide (LASIX) 40 MG tablet Take 1 tablet (40 mg total) by mouth daily. 180 tablet 1   levocetirizine (XYZAL) 5 MG tablet Take 1 tablet (5 mg total) by mouth every evening. 90 tablet 2   linaclotide (LINZESS) 290 MCG CAPS capsule Take 1 capsule (290 mcg total) by mouth daily before  breakfast. 90 capsule 2   Multiple Vitamins-Minerals (MULTIVITAMIN WITH MINERALS) tablet Take 1 tablet by mouth daily.     potassium chloride SA (KLOR-CON) 20 MEQ tablet Take 1 tablet (20 mEq total) by mouth daily. 1 tablet by mouth daily 90 tablet 3   propranolol (INDERAL) 10 MG tablet Take 1 tablet (10 mg total) by mouth 3 (three) times daily as needed (PALPITATIONS). 60 tablet 3   SUMAtriptan-naproxen (TREXIMET) 85-500 MG tablet Take 1 pill as needed for migraine, if migraine  persists may repeat x1 after 2 hours 10 tablet 8   Tart Cherry (TART CHERRY ULTRA) 1200 MG CAPS Take by mouth.     tiZANidine (ZANAFLEX) 4 MG tablet Take 1 tablet (4 mg total) by mouth at bedtime. 30 tablet 0   Turmeric (QC TUMERIC COMPLEX PO) Take by mouth. 1200 mg daily     venlafaxine XR (EFFEXOR-XR) 150 MG 24 hr capsule Take 1 capsule (150 mg total) by mouth 2 (two) times daily. 180 capsule 2   Vitamin D, Ergocalciferol, (DRISDOL) 1.25 MG (50000 UNIT) CAPS capsule Take 1 capsule (50,000 Units total) by mouth every 7 (seven) days. 12 capsule 0   rosuvastatin (CRESTOR) 20 MG tablet Take 1 tablet (20 mg total) by mouth daily. 90 tablet 3   [DISCONTINUED] oxycodone (OXY-IR) 5 MG capsule Take 5 mg by mouth every 4 (four) hours as needed. For pain.     No current facility-administered medications on file prior to visit.    Past Medical History:  Diagnosis Date   Abnormal stress test    a. 09/2016: NST showed a small defect of mild severity present in the basal inferoseptal and mid inferoseptal location, consistent with ischemia. --> medically managed   ALLERGIC RHINITIS    Anxiety    Bursitis    DDD (degenerative disc disease), lumbar    ESI with Ramos (spring 2016)   Depression    Diabetes mellitus without complication (HCC)    Dyslipidemia    External hemorrhoids    GERD (gastroesophageal reflux disease)    Hypertension    Obesity    Osteoarthritis    Palpitations    a. prior event monitor showing sinus tachycardia, no PAF.    Panic attacks    Hx of depression   Sleep apnea    CPAP hs    Past Surgical History:  Procedure Laterality Date   ABDOMINAL HYSTERECTOMY     MASS EXCISION Left 12/30/2015   Procedure: EXCISION LEFT LEG MASS;  Surgeon: Donnie Mesa, MD;  Location: [place];  Service: General;  Laterality: Left;   REPLACEMENT TOTAL KNEE Right    RIGHT OOPHORECTOMY     No cancer    Social History   Socioeconomic History   Marital status: Married     Spouse name: [name]   Number of children: 3   Years of education: Not on file   Highest education level: Not on file  Occupational History   Occupation: Disabled  Tobacco Use   Smoking status: Never   Smokeless tobacco: Never   Tobacco comments:    tried to as a teenager  Media planner   Vaping Use: Never used  Substance and Sexual Activity   Alcohol use: Yes    Alcohol/week: 1.0 standard drink    Types: 1 Glasses of wine per week    Comment: one drink a month   Drug use: No   Sexual activity: Not Currently  Other Topics Concern   Not on file  Social History Narrative   Married with children. Pt is on disablity. Previous worked in the school system   Social Determinants of Radio broadcast assistant Strain: Low Risk    Difficulty of Paying Living Expenses: Not hard at all  Food Insecurity: No Food Insecurity   Worried About Charity fundraiser in the Last Year: Never true   Arboriculturist in the Last Year: Never true  Transportation Needs: No Transportation Needs   Lack of Transportation (Medical): No   Lack of Transportation (Non-Medical): No  Physical Activity: Inactive   Days of Exercise per Week: 0 days   Minutes of Exercise per Session: 0 min  Stress: No Stress Concern Present   Feeling of Stress : Not at all  Social Connections: Socially Integrated   Frequency of Communication with Friends and Family: More than three times a week   Frequency of Social Gatherings with Friends and Family: Once a week   Attends Religious Services: More than 4 times per year   Active Member of [employer] or Organizations: No   Attends Music therapist: More than 4 times per year   Marital Status: Married    Family History  Problem Relation Age of Onset   Heart disease Mother        Enlarged Heart, Pacemaker   Diabetes Mother    Thyroid disease Mother    Hyperlipidemia Mother    Hypertension Mother    Sleep apnea Mother    Dementia Father    Kidney failure Father     Prostate cancer Father    Alcoholism Father    Asthma Other    Allergies Sister    Asthma Son    Breast cancer Maternal Aunt        cousin   Colon cancer Cousin    Heart disease Son        CHF, morbid obesity   Prostate cancer Maternal Uncle    Diabetes Son     Review of Systems  Constitutional:  Negative for chills and fever.  Respiratory:  Negative for cough, shortness of breath and wheezing.   Cardiovascular:  Negative for chest pain, palpitations and leg swelling.  Neurological:  Negative for light-headedness and headaches.      Objective:   Vitals:   11/11/21 1104  BP: 132/74  Pulse: 68  Temp: 98.2 F (36.8 C)  SpO2: 95%   BP Readings from Last 3 Encounters:  11/11/21 132/74  11/10/21 128/87  11/02/21 118/80   Wt Readings from Last 3 Encounters:  11/11/21 240 lb (108.9 kg)  11/10/21 242 lb (109.8 kg)  11/02/21 248 lb (112.5 kg)   Body mass index is 38.74 kg/m.   Physical Exam    Constitutional: Appears well-developed and well-nourished. No distress.  HENT:  Head: Normocephalic and atraumatic.  Neck: Neck supple. No tracheal deviation present. No thyromegaly present.  No cervical lymphadenopathy Cardiovascular: Normal rate, regular rhythm and normal heart sounds.   No murmur heard. No carotid bruit .  No edema Pulmonary/Chest: Effort normal and breath sounds normal. No respiratory distress. No has no wheezes. No rales.  Skin: Skin is warm and dry. Not diaphoretic.  Psychiatric: Normal mood and affect. Behavior is normal.      Assessment & Plan:    See Problem List for Assessment and Plan of chronic medical problems.

## 2021-11-11 ENCOUNTER — Ambulatory Visit (INDEPENDENT_AMBULATORY_CARE_PROVIDER_SITE_OTHER): Payer: Medicare Other | Admitting: Internal Medicine

## 2021-11-11 ENCOUNTER — Encounter: Payer: Self-pay | Admitting: Internal Medicine

## 2021-11-11 ENCOUNTER — Other Ambulatory Visit: Payer: Self-pay | Admitting: Internal Medicine

## 2021-11-11 VITALS — BP 132/74 | HR 68 | Temp 98.2°F | Ht 66.0 in | Wt 240.0 lb

## 2021-11-11 DIAGNOSIS — I10 Essential (primary) hypertension: Secondary | ICD-10-CM | POA: Diagnosis not present

## 2021-11-11 DIAGNOSIS — E1169 Type 2 diabetes mellitus with other specified complication: Secondary | ICD-10-CM

## 2021-11-11 DIAGNOSIS — G43001 Migraine without aura, not intractable, with status migrainosus: Secondary | ICD-10-CM

## 2021-11-11 DIAGNOSIS — E538 Deficiency of other specified B group vitamins: Secondary | ICD-10-CM | POA: Diagnosis not present

## 2021-11-11 DIAGNOSIS — K5909 Other constipation: Secondary | ICD-10-CM

## 2021-11-11 DIAGNOSIS — K219 Gastro-esophageal reflux disease without esophagitis: Secondary | ICD-10-CM | POA: Diagnosis not present

## 2021-11-11 DIAGNOSIS — E785 Hyperlipidemia, unspecified: Secondary | ICD-10-CM

## 2021-11-11 LAB — COMPREHENSIVE METABOLIC PANEL
ALT: 14 U/L (ref 0–35)
AST: 17 U/L (ref 0–37)
Albumin: 4 g/dL (ref 3.5–5.2)
Alkaline Phosphatase: 136 U/L — ABNORMAL HIGH (ref 39–117)
BUN: 12 mg/dL (ref 6–23)
CO2: 33 mEq/L — ABNORMAL HIGH (ref 19–32)
Calcium: 10.1 mg/dL (ref 8.4–10.5)
Chloride: 103 mEq/L (ref 96–112)
Creatinine, Ser: 0.82 mg/dL (ref 0.40–1.20)
GFR: 73.74 mL/min (ref >60.00)
Glucose, Bld: 95 mg/dL (ref 70–99)
Potassium: 3.7 mEq/L (ref 3.5–5.1)
Sodium: 140 mEq/L (ref 135–145)
Total Bilirubin: 0.3 mg/dL (ref 0.2–1.2)
Total Protein: 7.4 g/dL (ref 6.0–8.3)

## 2021-11-11 LAB — LIPID PANEL
Cholesterol: 144 mg/dL (ref 0–200)
HDL: 52.8 mg/dL (ref >39.00)
LDL Cholesterol: 71 mg/dL (ref 0–99)
NonHDL: 91.04
Total CHOL/HDL Ratio: 3
Triglycerides: 101 mg/dL (ref 0.0–149.0)
VLDL: 20.2 mg/dL (ref 0.0–40.0)

## 2021-11-11 LAB — VITAMIN B12: Vitamin B-12: 1441 pg/mL — ABNORMAL HIGH (ref 211–911)

## 2021-11-11 LAB — MICROALBUMIN / CREATININE URINE RATIO
Creatinine,U: 145 mg/dL
Microalb Creat Ratio: 0.6 mg/g (ref 0.0–30.0)
Microalb, Ur: 0.8 mg/dL (ref 0.0–1.9)

## 2021-11-11 LAB — HEMOGLOBIN A1C: Hgb A1c MFr Bld: 6.3 % (ref 4.6–6.5)

## 2021-11-11 MED ORDER — OMEPRAZOLE 20 MG PO CPDR: 20.0000 mg | DELAYED_RELEASE_CAPSULE | Freq: Every day | ORAL | 2 refills | Status: DC

## 2021-11-11 MED ORDER — DAPAGLIFLOZIN PROPANEDIOL 10 MG PO TABS: 10.0000 mg | ORAL_TABLET | Freq: Every day | ORAL | 1 refills | Status: DC

## 2021-11-11 MED ORDER — RYBELSUS 7 MG PO TABS: 7.0000 mg | ORAL_TABLET | Freq: Every day | ORAL | 1 refills | Status: DC

## 2021-11-11 NOTE — Assessment & Plan Note (Signed)
Chronic Infrequent migraines  continue Treximet as needed

## 2021-11-11 NOTE — Assessment & Plan Note (Signed)
Chronic Taking oral B12 Will check B12 level Discussed monthly injections if B12 level is still low

## 2021-11-11 NOTE — Assessment & Plan Note (Signed)
Chronic Blood pressure well controlled CMP Continue atenolol 25 mg morning and 50 mg evening, Lasix 40 mg daily

## 2021-11-11 NOTE — Assessment & Plan Note (Signed)
Chronic GERD controlled Continue omeprazole 20 mg daily  

## 2021-11-11 NOTE — Assessment & Plan Note (Signed)
Chronic Regular exercise and healthy diet encouraged Check lipid panel  Continue rosuvastatin 20 mg daily 

## 2021-11-11 NOTE — Assessment & Plan Note (Signed)
Chronic Well-controlled Today we discontinued metformin and started her on Farxiga 10 mg daily-we will continue and started Rybelsus 3 mg daily, which she is doing well with Will increase Rybelsus to 7 mg daily Encouraged diet changes, regular exercise and continued weight loss efforts Check A1c, urine microalbumin

## 2021-11-12 DIAGNOSIS — J329 Chronic sinusitis, unspecified: Secondary | ICD-10-CM | POA: Diagnosis not present

## 2021-11-12 DIAGNOSIS — G4733 Obstructive sleep apnea (adult) (pediatric): Secondary | ICD-10-CM | POA: Diagnosis not present

## 2021-11-12 DIAGNOSIS — Z9989 Dependence on other enabling machines and devices: Secondary | ICD-10-CM | POA: Diagnosis not present

## 2021-11-16 DIAGNOSIS — G4733 Obstructive sleep apnea (adult) (pediatric): Secondary | ICD-10-CM | POA: Diagnosis not present

## 2021-11-23 NOTE — Progress Notes (Unsigned)
[name] Phone: [phone] Subjective:    I'm seeing this patient by the request  of:  [name], MD  CC:   ION:[id number]  09/29/2021 Patient does have fibromuscular dysplasia noted on the most recent testing.  Concerned that patient could have more of the peripheral aspect of this as well.  Would normally do ABIs as well as Doppler testing but will refer to vascular to see if there is more appropriate testing.  Depending on findings this could be one of the causes of the lower extremity pain that she gets.  Otherwise we will need to look more at the lumbar aspect.  Bilateral injections given today, tolerated the procedure well, discussed icing regimen and home exercise, discussed which activities to do which wants to avoid.  Encourage patient to work on hip abductor strengthening as well as weight loss.  Follow-up with me again in 6 to 8 weeks.  Update 11/24/2021 Erica [name] is a 66 y.o. female coming in with complaint of B hip pain. Patient states       Past Medical History:  Diagnosis Date   Abnormal stress test    a. 09/2016: NST showed a small defect of mild severity present in the basal inferoseptal and mid inferoseptal location, consistent with ischemia. --> medically managed   ALLERGIC RHINITIS    Anxiety    Bursitis    DDD (degenerative disc disease), lumbar    ESI with Ramos (spring 2016)   Depression    Diabetes mellitus without complication (HCC)    Dyslipidemia    External hemorrhoids    GERD (gastroesophageal reflux disease)    Hypertension    Obesity    Osteoarthritis    Palpitations    a. prior event monitor showing sinus tachycardia, no PAF.    Panic attacks    Hx of depression   Sleep apnea    CPAP hs   Past Surgical History:  Procedure Laterality Date   ABDOMINAL HYSTERECTOMY     MASS EXCISION Left 12/30/2015   Procedure: EXCISION LEFT LEG MASS;  Surgeon: Donnie Mesa, MD;  Location: [facility];  Service: General;  Laterality: Left;   REPLACEMENT TOTAL KNEE Right    RIGHT OOPHORECTOMY     No cancer   Social History   Socioeconomic History   Marital status: Married    Spouse name: [name]   Number of children: 3   Years of education: Not on file   Highest education level: Not on file  Occupational History   Occupation: Disabled  Tobacco Use   Smoking status: Never   Smokeless tobacco: Never   Tobacco comments:    tried to as a teenager  Media planner   Vaping Use: Never used  Substance and Sexual Activity   Alcohol use: Yes    Alcohol/week: 1.0 standard drink    Types: 1 Glasses of wine per week    Comment: one drink a month   Drug use: No   Sexual activity: Not Currently  Other Topics Concern   Not on file  Social History Narrative   Married with children. Pt is on disablity. Previous worked in the school system   Social Determinants of Radio broadcast assistant Strain: Low Risk    Difficulty of Paying Living Expenses: Not hard at all  Food Insecurity: No Food Insecurity   Worried About Charity fundraiser in the Last Year: Never  true   Ran Out of Food in the Last Year: Never true  Transportation Needs: No Transportation Needs   Lack of Transportation (Medical): No   Lack of Transportation (Non-Medical): No  Physical Activity: Inactive   Days of Exercise per Week: 0 days   Minutes of Exercise per Session: 0 min  Stress: No Stress Concern Present   Feeling of Stress : Not at all  Social Connections: Socially Integrated   Frequency of Communication with Friends and Family: More than three times a week   Frequency of Social Gatherings with Friends and Family: Once a week   Attends Religious Services: More than 4 times per year   Active Member of [employer] or Organizations: No   Attends Music therapist: More than 4 times per year   Marital Status: Married   Allergies  Allergen Reactions   Seroquel  [Quetiapine Fumarate]     Hallucinations, palpitations   Codeine Nausea And Vomiting   Guaifenesin-Codeine Other (See Comments)    REACTION: feels spacey   Wellbutrin [Bupropion] Palpitations    hallucinations   Family History  Problem Relation Age of Onset   Heart disease Mother        Enlarged Heart, Pacemaker   Diabetes Mother    Thyroid disease Mother    Hyperlipidemia Mother    Hypertension Mother    Sleep apnea Mother    Dementia Father    Kidney failure Father    Prostate cancer Father    Alcoholism Father    Asthma Other    Allergies Sister    Asthma Son    Breast cancer Maternal Aunt        cousin   Colon cancer Cousin    Heart disease Son        CHF, morbid obesity   Prostate cancer Maternal Uncle    Diabetes Son     Current Outpatient Medications (Endocrine & Metabolic):    dapagliflozin propanediol (FARXIGA) 10 MG TABS tablet, Take 1 tablet (10 mg total) by mouth daily before breakfast.   Semaglutide (RYBELSUS) 7 MG TABS, Take 7 mg by mouth daily.  Current Outpatient Medications (Cardiovascular):    atenolol (TENORMIN) 25 MG tablet, TAKE ONE TABLET BY MOUTH IN THE MORNING AND TAKE TWO TABLETS IN THE  EVENING   furosemide (LASIX) 40 MG tablet, Take 1 tablet (40 mg total) by mouth daily.   propranolol (INDERAL) 10 MG tablet, Take 1 tablet (10 mg total) by mouth 3 (three) times daily as needed (PALPITATIONS).   rosuvastatin (CRESTOR) 20 MG tablet, Take 1 tablet (20 mg total) by mouth daily.  Current Outpatient Medications (Respiratory):    albuterol (VENTOLIN HFA) 108 (90 Base) MCG/ACT inhaler, Inhale 2 puffs into the lungs every 6 (six) hours as needed for wheezing or shortness of breath.   fluticasone (FLONASE) 50 MCG/ACT nasal spray, Place 2 sprays into both nostrils daily.   levocetirizine (XYZAL) 5 MG tablet, Take 1 tablet (5 mg total) by mouth every evening.  Current Outpatient Medications (Analgesics):    SUMAtriptan-naproxen (TREXIMET) 85-500 MG  tablet, Take 1 pill as needed for migraine, if migraine persists may repeat x1 after 2 hours   Current Outpatient Medications (Other):    ALPRAZolam (XANAX) 1 MG tablet, Take 1 mg by mouth as needed for anxiety.   AMBULATORY NON FORMULARY MEDICATION, 4 Prong cane   blood glucose meter kit and supplies KIT, Dispense based on patient and insurance preference. Use up to four times daily as directed. (  FOR E11.9).   linaclotide (LINZESS) 290 MCG CAPS capsule, Take 1 capsule (290 mcg total) by mouth daily before breakfast.   Multiple Vitamins-Minerals (MULTIVITAMIN WITH MINERALS) tablet, Take 1 tablet by mouth daily.   omeprazole (PRILOSEC) 20 MG capsule, Take 1 capsule (20 mg total) by mouth daily.   potassium chloride SA (KLOR-CON) 20 MEQ tablet, Take 1 tablet (20 mEq total) by mouth daily. 1 tablet by mouth daily   Tart Cherry (TART CHERRY ULTRA) 1200 MG CAPS, Take by mouth.   tiZANidine (ZANAFLEX) 4 MG tablet, Take 1 tablet (4 mg total) by mouth at bedtime.   Turmeric (QC TUMERIC COMPLEX PO), Take by mouth. 1200 mg daily   venlafaxine XR (EFFEXOR-XR) 150 MG 24 hr capsule, Take 1 capsule (150 mg total) by mouth 2 (two) times daily.   Vitamin D, Ergocalciferol, (DRISDOL) 1.25 MG (50000 UNIT) CAPS capsule, Take 1 capsule (50,000 Units total) by mouth every 7 (seven) days.   Reviewed prior external information including notes and imaging from  primary care provider As well as notes that were available from care everywhere and other healthcare systems.  Past medical history, social, surgical and family history all reviewed in electronic medical record.  No pertanent information unless stated regarding to the chief complaint.   Review of Systems:  No headache, visual changes, nausea, vomiting, diarrhea, constipation, dizziness, abdominal pain, skin rash, fevers, chills, night sweats, weight loss, swollen lymph nodes, body aches, joint swelling, chest pain, shortness of breath, mood changes. POSITIVE  muscle aches  Objective  There were no vitals taken for this visit.   General: No apparent distress alert and oriented x3 mood and affect normal, dressed appropriately.  HEENT: Pupils equal, extraocular movements intact  Respiratory: Patient's speak in full sentences and does not appear short of breath  Cardiovascular: No lower extremity edema, non tender, no erythema  Gait normal with good balance and coordination.  MSK:  Non tender with full range of motion and good stability and symmetric strength and tone of shoulders, elbows, wrist, hip, knee and ankles bilaterally.     Impression and Recommendations:     The above documentation has been reviewed and is accurate and complete Jacqualin Combes

## 2021-11-24 ENCOUNTER — Ambulatory Visit: Payer: Medicare Other | Admitting: Family Medicine

## 2021-11-26 ENCOUNTER — Telehealth: Payer: Medicare Other

## 2021-12-02 DIAGNOSIS — J329 Chronic sinusitis, unspecified: Secondary | ICD-10-CM | POA: Diagnosis not present

## 2021-12-02 DIAGNOSIS — J342 Deviated nasal septum: Secondary | ICD-10-CM | POA: Diagnosis not present

## 2021-12-02 DIAGNOSIS — J3489 Other specified disorders of nose and nasal sinuses: Secondary | ICD-10-CM | POA: Diagnosis not present

## 2021-12-10 ENCOUNTER — Encounter: Payer: Self-pay | Admitting: Emergency Medicine

## 2021-12-10 ENCOUNTER — Ambulatory Visit
Admission: EM | Admit: 2021-12-10 | Discharge: 2021-12-10 | Disposition: A | Discharge status: Home / Self Care | Payer: Medicare Other | Attending: Internal Medicine | Admitting: Internal Medicine

## 2021-12-10 ENCOUNTER — Other Ambulatory Visit: Payer: Self-pay

## 2021-12-10 DIAGNOSIS — B349 Viral infection, unspecified: Secondary | ICD-10-CM | POA: Diagnosis not present

## 2021-12-10 DIAGNOSIS — J029 Acute pharyngitis, unspecified: Secondary | ICD-10-CM

## 2021-12-10 LAB — POCT RAPID STREP A (OFFICE): Rapid Strep A Screen: NEGATIVE

## 2021-12-10 NOTE — ED Provider Notes (Signed)
[facility]    CSN: [id number] Arrival date & time: 12/10/21  1447      History   Chief Complaint Chief Complaint  Patient presents with   Sore Throat    HPI Erica [name] is a 66 y.o. female.   Patient presents with sore throat, chills, and nasal congestion that started this morning upon awakening.  Denies any known fevers.  She reports that her granddaughter has had strep throat.  Denies cough, chest pain, shortness of breath, nausea, vomiting, diarrhea, abdominal pain.  Patient has used Chloraseptic spray and over-the-counter cold and flu medications with minimal improvement in symptoms.   Sore Throat   Past Medical History:  Diagnosis Date   Abnormal stress test    a. 09/2016: NST showed a small defect of mild severity present in the basal inferoseptal and mid inferoseptal location, consistent with ischemia. --> medically managed   ALLERGIC RHINITIS    Anxiety    Bursitis    DDD (degenerative disc disease), lumbar    ESI with Ramos (spring 2016)   Depression    Diabetes mellitus without complication (HCC)    Dyslipidemia    External hemorrhoids    GERD (gastroesophageal reflux disease)    Hypertension    Obesity    Osteoarthritis    Palpitations    a. prior event monitor showing sinus tachycardia, no PAF.    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[place]) 08/10/2021   Urge incontinence of urine 08/10/2021   B12 deficiency 02/05/2021   Morbid obesity ([place]) 12/08/2020   Greater trochanteric bursitis of both hips 08/25/2020   Greater trochanteric bursitis of right hip 05/05/2020   Chronic knee pain after total replacement of right knee joint 01/14/2020   Chronic constipation 08/23/2019   Abdominal pain 08/23/2019   Bilateral carotid artery stenosis 06/04/2019   Osteopenia 05/19/2019   Left knee pain  12/11/2018   Class 2 severe obesity with serious comorbidity and body mass index (BMI) of 37.0 to 37.9 in adult [facility]) 08/31/2018   Degeneration of lumbar intervertebral disc 05/30/2018   Onychomycosis 12/07/2017   Left hip pain 06/12/2017   Acute left-sided low back pain without sciatica 06/12/2017   Meningioma ([place]) 03/28/2017   Panic attacks 09/27/2016   GERD (gastroesophageal reflux disease) 09/27/2016   Diabetes ([place]) 02/15/2016   Migraine without aura and with status migrainosus, not intractable 10/03/2015   Numbness of left hand 10/03/2015   Cephalalgia 10/03/2015   Palpitations 09/08/2012   Sciatica of left side    Peripheral edema 05/09/2012   Obstructive sleep apnea 10/31/2009   External hemorrhoids 10/17/2009   Dyslipidemia 01/12/2008   Depression, major, recurrent ([place]) 01/12/2008   Essential hypertension, benign 01/12/2008   Allergic rhinitis 01/12/2008    Past Surgical History:  Procedure Laterality Date   ABDOMINAL HYSTERECTOMY     MASS EXCISION Left 12/30/2015   Procedure: EXCISION LEFT LEG MASS;  Surgeon: Donnie Mesa, MD;  Location: [facility];  Service: General;  Laterality: Left;   REPLACEMENT TOTAL KNEE Right    RIGHT OOPHORECTOMY     No cancer    OB History     Gravida  3   Para  3   Term  3   Preterm      AB  Living  3      SAB      IAB      Ectopic      Multiple      Live Births               Home Medications    Prior to Admission medications   Medication Sig Start Date End Date Taking? Authorizing Provider  albuterol (VENTOLIN HFA) 108 (90 Base) MCG/ACT inhaler Inhale 2 puffs into the lungs every 6 (six) hours as needed for wheezing or shortness of breath. 02/06/21   Burns, Claudina Lick, MD  ALPRAZolam Duanne Moron) 1 MG tablet Take 1 mg by mouth as needed for anxiety.    [provider]  AMBULATORY NON FORMULARY MEDICATION 4 Prong cane 02/02/21   Lyndal Pulley, DO  atenolol (TENORMIN) 25 MG tablet TAKE  ONE TABLET BY MOUTH IN THE MORNING AND TAKE TWO TABLETS IN THE  EVENING 09/17/21   [name], MD  blood glucose meter kit and supplies KIT Dispense based on patient and insurance preference. Use up to four times daily as directed. (FOR E11.9). 09/14/19   [name], MD  dapagliflozin propanediol (FARXIGA) 10 MG TABS tablet Take 1 tablet (10 mg total) by mouth daily before breakfast. 11/11/21   Burns, Claudina Lick, MD  fluticasone (FLONASE) 50 MCG/ACT nasal spray Place 2 sprays into both nostrils daily. 06/24/20   [name], MD  furosemide (LASIX) 40 MG tablet Take 1 tablet (40 mg total) by mouth daily. 07/03/21   Warren Lacy, PA-C  levocetirizine (XYZAL) 5 MG tablet Take 1 tablet (5 mg total) by mouth every evening. 08/11/21   [name], MD  linaclotide Rolan Lipa) 290 MCG CAPS capsule Take 1 capsule (290 mcg total) by mouth daily before breakfast. 08/11/21   Burns, Claudina Lick, MD  Multiple Vitamins-Minerals (MULTIVITAMIN WITH MINERALS) tablet Take 1 tablet by mouth daily.    [provider]  omeprazole (PRILOSEC) 20 MG capsule Take 1 capsule (20 mg total) by mouth daily. 11/11/21   [name], MD  potassium chloride SA (KLOR-CON) 20 MEQ tablet Take 1 tablet (20 mEq total) by mouth daily. 1 tablet by mouth daily 07/03/21   Warren Lacy, PA-C  propranolol (INDERAL) 10 MG tablet Take 1 tablet (10 mg total) by mouth 3 (three) times daily as needed (PALPITATIONS). 09/19/20   Minus Breeding, MD  rosuvastatin (CRESTOR) 20 MG tablet Take 1 tablet (20 mg total) by mouth daily. 08/10/21 11/08/21  [name], MD  Semaglutide (RYBELSUS) 7 MG TABS Take 7 mg by mouth daily. 11/11/21   [name], MD  SUMAtriptan-naproxen (TREXIMET) 85-500 MG tablet Take 1 pill as needed for migraine, if migraine persists may repeat x1 after 2 hours 12/25/20   Burns, Claudina Lick, MD  Tart Cherry (TART CHERRY ULTRA) 1200 MG CAPS Take by mouth.    [provider]  tiZANidine (ZANAFLEX) 4 MG tablet  Take 1 tablet (4 mg total) by mouth at bedtime. 09/29/21   Lyndal Pulley, DO  Turmeric (QC TUMERIC COMPLEX PO) Take by mouth. 1200 mg daily    [provider]  venlafaxine XR (EFFEXOR-XR) 150 MG 24 hr capsule Take 1 capsule (150 mg total) by mouth 2 (two) times daily. 08/11/21   [name], MD  Vitamin D, Ergocalciferol, (DRISDOL) 1.25 MG (50000 UNIT) CAPS capsule Take 1 capsule (50,000 Units total) by mouth every 7 (seven) days. 10/01/21   [name]  M, DO  oxycodone (OXY-IR) 5 MG capsule Take 5 mg by mouth every 4 (four) hours as needed. For pain.  02/27/15  [provider]    Family History Family History  Problem Relation Age of Onset   Heart disease Mother        Enlarged Heart, Pacemaker   Diabetes Mother    Thyroid disease Mother    Hyperlipidemia Mother    Hypertension Mother    Sleep apnea Mother    Dementia Father    Kidney failure Father    Prostate cancer Father    Alcoholism Father    Asthma Other    Allergies Sister    Asthma Son    Breast cancer Maternal Aunt        cousin   Colon cancer Cousin    Heart disease Son        CHF, morbid obesity   Prostate cancer Maternal Uncle    Diabetes Son     Social History Social History   Tobacco Use   Smoking status: Never   Smokeless tobacco: Never   Tobacco comments:    tried to as a teenager  Media planner   Vaping Use: Never used  Substance Use Topics   Alcohol use: Yes    Alcohol/week: 1.0 standard drink    Types: 1 Glasses of wine per week    Comment: one drink a month   Drug use: No     Allergies   Seroquel [quetiapine fumarate], Codeine, Guaifenesin-codeine, and Wellbutrin [bupropion]   Review of Systems Review of Systems Per HPI  Physical Exam Triage Vital Signs ED Triage Vitals  Enc Vitals Group     BP 12/10/21 1454 127/82     Pulse Rate 12/10/21 1454 73     Resp 12/10/21 1454 16     Temp 12/10/21 1454 98.5 F (36.9 C)     Temp Source 12/10/21 1454 Oral     SpO2  12/10/21 1454 96 %     Weight --      Height --      Head Circumference --      Peak Flow --      Pain Score 12/10/21 1456 6     Pain Loc --      Pain Edu? --      Excl. in [place]? --    No data found.  Updated Vital Signs BP 127/82 (BP Location: Left Arm)    Pulse 73    Temp 98.5 F (36.9 C) (Oral)    Resp 16    SpO2 96%   Visual Acuity Right Eye Distance:   Left Eye Distance:   Bilateral Distance:    Right Eye Near:   Left Eye Near:    Bilateral Near:     Physical Exam Constitutional:      General: She is not in acute distress.    Appearance: Normal appearance. She is not toxic-appearing or diaphoretic.  HENT:     Head: Normocephalic and atraumatic.     Right Ear: Tympanic membrane and ear canal normal.     Left Ear: Tympanic membrane and ear canal normal.     Nose: Congestion present.     Mouth/Throat:     Mouth: Mucous membranes are moist.     Pharynx: Posterior oropharyngeal erythema present.  Eyes:     Extraocular Movements: Extraocular movements intact.     Conjunctiva/sclera: Conjunctivae normal.     Pupils: Pupils are equal, round, and reactive to  light.  Cardiovascular:     Rate and Rhythm: Normal rate and regular rhythm.     Pulses: Normal pulses.     Heart sounds: Normal heart sounds.  Pulmonary:     Effort: Pulmonary effort is normal. No respiratory distress.     Breath sounds: Normal breath sounds. No wheezing.  Abdominal:     General: Abdomen is flat. Bowel sounds are normal.     Palpations: Abdomen is soft.  Musculoskeletal:        General: Normal range of motion.     Cervical back: Normal range of motion.  Skin:    General: Skin is warm and dry.  Neurological:     General: No focal deficit present.     Mental Status: She is alert and oriented to person, place, and time. Mental status is at baseline.  Psychiatric:        Mood and Affect: Mood normal.        Behavior: Behavior normal.     UC Treatments / Results  Labs (all labs ordered are  listed, but only abnormal results are displayed) Labs Reviewed  CULTURE, GROUP A STREP ([place])  COVID-19, FLU A+B NAA  POCT RAPID STREP A (OFFICE)    EKG   Radiology No results found.  Procedures Procedures (including critical care time)  Medications Ordered in UC Medications - No data to display  Initial Impression / Assessment and Plan / UC Course  I have reviewed the triage vital signs and the nursing notes.  Pertinent labs & imaging results that were available during my care of the patient were reviewed by me and considered in my medical decision making (see chart for details).     Rapid strep test was negative.  Low suspicion for strep throat given appearance of posterior pharynx on exam.  Will opt to wait on throat culture for any additional treatment.  Suspect viral cause of patient's symptoms.  COVID and flu test is pending.  Discussed supportive care and symptom management with patient.  Discussed return precautions.  No signs of peritonsillar abscess on exam.  Patient verbalized understanding and was agreeable with plan. Final Clinical Impressions(s) / UC Diagnoses   Final diagnoses:  Sore throat  Viral illness     Discharge Instructions      Rapid strep test was negative.  Throat culture is pending.  COVID and flu test is also pending.  We will call if it is positive and treat as appropriate.  It appears that you may have a viral illness since your rapid strep was negative.  This should self resolve with symptomatic treatment in the next few days.  Please follow-up if symptoms persist or worsen.    ED Prescriptions   None    PDMP not reviewed this encounter.   Teodora Medici, [place] 12/10/21 [phone]

## 2021-12-10 NOTE — Discharge Instructions (Signed)
Rapid strep test was negative.  Throat culture is pending.  COVID and flu test is also pending.  We will call if it is positive and treat as appropriate.  It appears that you may have a viral illness since your rapid strep was negative.  This should self resolve with symptomatic treatment in the next few days.  Please follow-up if symptoms persist or worsen. ?

## 2021-12-10 NOTE — ED Triage Notes (Signed)
Granddaughter had strep throat. Pt woke up this morning with chills and a sore throat. ?

## 2021-12-11 LAB — COVID-19, FLU A+B NAA
Influenza A, NAA: NOT DETECTED
Influenza B, NAA: NOT DETECTED
SARS-CoV-2, NAA: NOT DETECTED

## 2021-12-13 LAB — CULTURE, GROUP A STREP (THRC)

## 2021-12-14 DIAGNOSIS — G4733 Obstructive sleep apnea (adult) (pediatric): Secondary | ICD-10-CM | POA: Diagnosis not present

## 2021-12-22 ENCOUNTER — Encounter: Payer: Self-pay | Admitting: Nurse Practitioner

## 2021-12-22 ENCOUNTER — Encounter: Payer: Self-pay | Admitting: Internal Medicine

## 2021-12-23 DIAGNOSIS — E119 Type 2 diabetes mellitus without complications: Secondary | ICD-10-CM | POA: Diagnosis not present

## 2021-12-24 ENCOUNTER — Ambulatory Visit: Payer: Medicare Other | Admitting: Pulmonary Disease

## 2021-12-30 ENCOUNTER — Other Ambulatory Visit: Payer: Self-pay

## 2021-12-30 ENCOUNTER — Encounter: Payer: Self-pay | Admitting: Nurse Practitioner

## 2021-12-30 ENCOUNTER — Ambulatory Visit (INDEPENDENT_AMBULATORY_CARE_PROVIDER_SITE_OTHER): Payer: Medicare Other | Admitting: Nurse Practitioner

## 2021-12-30 VITALS — BP 122/78 | HR 77 | Temp 98.7°F | Ht 66.0 in | Wt 239.0 lb

## 2021-12-30 DIAGNOSIS — G4733 Obstructive sleep apnea (adult) (pediatric): Secondary | ICD-10-CM

## 2021-12-30 DIAGNOSIS — I10 Essential (primary) hypertension: Secondary | ICD-10-CM

## 2021-12-30 DIAGNOSIS — J329 Chronic sinusitis, unspecified: Secondary | ICD-10-CM

## 2021-12-30 DIAGNOSIS — E6609 Other obesity due to excess calories: Secondary | ICD-10-CM | POA: Diagnosis not present

## 2021-12-30 DIAGNOSIS — Z6838 Body mass index (BMI) 38.0-38.9, adult: Secondary | ICD-10-CM

## 2021-12-30 NOTE — Patient Instructions (Addendum)
Continue to use CPAP every night, minimum of 4-6 hours a night.  ?Change equipment every 30 days or as directed by DME. Wash your tubing with warm soap and water daily, hang to dry. Wash humidifier portion weekly.  ?Be aware of reduced alertness and do not drive or operate heavy machinery if experiencing this or drowsiness.  ?Exercise 150 min/week ?Avoid or decrease alcohol consumption and medications that make you more sleepy, if possible. ?Notify if persistent daytime sleepiness occurs even with consistent use of CPAP. ? ?Adjust CPAP pressure to 5-15 cmH2O ? ?Follow up in 3 months with Dr. [name] or Alanson Aly. If symptoms do not improve or worsen, please contact office for sooner follow up or seek emergency care. ?

## 2021-12-30 NOTE — Assessment & Plan Note (Signed)
Well-controlled on current regimen. Follow up with PCP as scheduled.  ?

## 2021-12-30 NOTE — Assessment & Plan Note (Signed)
Continues to experience issues with tolerating CPAP device. Feels like her pressures are too high. Median pressure it 8.6. Discussed that we could decrease her minimum to 5 cmH2O. Advised her to use a small amount of saline nasal gel in each nostril before putting on her mask to help with dry throat. She is considering surgical intervention for her chronic sinusitis with ENT. Discussed that if she were to get down to a BMI below 35, we could discuss Inspire device further. She has lost around 30 pounds already. Encouraged to continue using CPAP. ? ?Patient Instructions  ?Continue to use CPAP every night, minimum of 4-6 hours a night.  ?Change equipment every 30 days or as directed by DME. Wash your tubing with warm soap and water daily, hang to dry. Wash humidifier portion weekly.  ?Be aware of reduced alertness and do not drive or operate heavy machinery if experiencing this or drowsiness.  ?Exercise 150 min/week ?Avoid or decrease alcohol consumption and medications that make you more sleepy, if possible. ?Notify if persistent daytime sleepiness occurs even with consistent use of CPAP. ? ?Adjust CPAP pressure to 5-15 cmH2O ? ?Follow up in 3 months with Dr. [name] or Alanson Aly. If symptoms do not improve or worsen, please contact office for sooner follow up or seek emergency care. ? ? ?

## 2021-12-30 NOTE — Assessment & Plan Note (Signed)
Follow up with ENT as scheduled

## 2021-12-30 NOTE — Assessment & Plan Note (Signed)
Referred to healthy weight management at previous visit. Is now down 20-30 lbs. Encouraged further healthy weight management interventions. She is going to start water aerobics back soon.  ?

## 2021-12-30 NOTE — Progress Notes (Signed)
? ?'@Patient'$  ID: Erica [name], female    DOB: 10-28-1955, 66 y.o.   MRN: [id number] ? ?Chief Complaint  ?Patient presents with  ? Follow-up  ?  She is having trouble sleeping with her CPAP and she feels that her throat feels sore and ear pain.   ? ? ?Referring provider: ?[name], MD ? ?HPI: ?66 year old female, never smoker followed for OSA. She is a patient of Dr. Bari Mantis and last seen in office on 12/19/2020. Past medical history significant for HTN, b/l carotid artery stenosis, migraines, allergic rhinitis, GERD, DM, osteopenia, HLD, depression, morbid obesity, CKD, B12 deficiency.  ? ?TEST/EVENTS:  ?03/21/2017 HST with oral appliance: AHI 12.3, SpO2 low 75% ?05/2019 CPAP titration: 13 cmH2O ? ?12/19/2020: OV with Dr. [name]. Mild to moderate OSA - hx of trouble with CPAP d/t claustrophobia with full facemask and sinusitis. Minimal success with oral appliance and BMI too high for Inspire. RX for CPAP with nasal pillows. Healthy weight management discussed. Advised to consider bariatric surgery. Referral placed to nutrition.  ? ?12/30/2021: Today - follow up ?Patient presents today for follow up. She feels like her CPAP pressures are still too high and continues to have problems with sinusitis and feeling dried out which have led her to not wear it much. She did see ENT who discussed possible surgical intervention to help with this, which she is considering. Her daytime fatigue symptoms are relatively unchanged. She denies morning headaches, drowsy driving, narcolepsy. Her breathing is stable without any concerns or complaints.  ? ?AirView Download: CPAP 8-15 cmH2O ?9/30 days used (7% >4 hr), average use 3 hours 20 min ?Pressure median 8.6, 95th 10.1  ?Leaks median 6, 95th 28 ?AHI 1.8 ? ?Allergies  ?Allergen Reactions  ? Seroquel [Quetiapine Fumarate] Other (See Comments)  ?  Hallucinations, palpitations  ? Codeine Nausea And Vomiting  ? Guaifenesin-Codeine Other (See Comments)  ?  REACTION: feels spacey  ?  Wellbutrin [Bupropion] Palpitations  ?  hallucinations  ? ? ?Immunization History  ?Administered Date(s) Administered  ? Fluad Quad(high Dose 65+) 07/14/2019, 10/31/2020, 08/10/2021  ? Influenza,inj,Quad PF,6+ Mos 07/25/2013, 09/09/2014, 08/22/2015, 09/27/2016, 09/27/2017, 08/01/2018  ? PFIZER Comirnaty(Gray Top)Covid-19 Tri-Sucrose Vaccine 03/30/2021  ? PFIZER(Purple Top)SARS-COV-2 Vaccination 12/19/2019, 01/09/2020, 08/05/2020  ? Pneumococcal Conjugate-13 05/18/2019  ? Pneumococcal Polysaccharide-23 01/30/2009, 06/02/2020  ? Td 03/17/2004  ? Tdap 09/09/2014  ? Zoster Recombinat (Shingrix) 08/07/2019, 10/28/2019  ? ? ?Past Medical History:  ?Diagnosis Date  ? Abnormal stress test   ? a. 09/2016: NST showed a small defect of mild severity present in the basal inferoseptal and mid inferoseptal location, consistent with ischemia. --> medically managed  ? ALLERGIC RHINITIS   ? Anxiety   ? Bursitis   ? DDD (degenerative disc disease), lumbar   ? ESI with [facility] (spring 2016)  ? Depression   ? Diabetes mellitus without complication ([place])   ? Dyslipidemia   ? External hemorrhoids   ? GERD (gastroesophageal reflux disease)   ? Hypertension   ? Obesity   ? Osteoarthritis   ? Palpitations   ? a. prior event monitor showing sinus tachycardia, no PAF.   ? Panic attacks   ? Hx of depression  ? Sleep apnea   ? CPAP hs  ? ? ?Tobacco History: ?Social History  ? ?Tobacco Use  ?Smoking Status Never  ?Smokeless Tobacco Never  ?Tobacco Comments  ? tried to as a teenager  ? ?Counseling given: Not Answered ?Tobacco comments: tried to as a teenager ? ? ?  Outpatient Medications Prior to Visit  ?Medication Sig Dispense Refill  ? albuterol (VENTOLIN HFA) 108 (90 Base) MCG/ACT inhaler Inhale 2 puffs into the lungs every 6 (six) hours as needed for wheezing or shortness of breath. 16 g 3  ? ALPRAZolam (XANAX) 1 MG tablet Take 1 mg by mouth as needed for anxiety.    ? AMBULATORY NON FORMULARY MEDICATION 4 Prong cane 1 Units 0  ? atenolol  (TENORMIN) 25 MG tablet TAKE ONE TABLET BY MOUTH IN THE MORNING AND TAKE TWO TABLETS IN THE  EVENING 270 tablet 2  ? blood glucose meter kit and supplies KIT Dispense based on patient and insurance preference. Use up to four times daily as directed. (FOR E11.9). 1 each 0  ? dapagliflozin propanediol (FARXIGA) 10 MG TABS tablet Take 1 tablet (10 mg total) by mouth daily before breakfast. 90 tablet 1  ? fluticasone (FLONASE) 50 MCG/ACT nasal spray Place 2 sprays into both nostrils daily. 16 g 0  ? furosemide (LASIX) 40 MG tablet Take 1 tablet (40 mg total) by mouth daily. 180 tablet 1  ? levocetirizine (XYZAL) 5 MG tablet Take 1 tablet (5 mg total) by mouth every evening. 90 tablet 2  ? linaclotide (LINZESS) 290 MCG CAPS capsule Take 1 capsule (290 mcg total) by mouth daily before breakfast. 90 capsule 2  ? Multiple Vitamins-Minerals (MULTIVITAMIN WITH MINERALS) tablet Take 1 tablet by mouth daily.    ? omeprazole (PRILOSEC) 20 MG capsule Take 1 capsule (20 mg total) by mouth daily. 90 capsule 2  ? potassium chloride SA (KLOR-CON) 20 MEQ tablet Take 1 tablet (20 mEq total) by mouth daily. 1 tablet by mouth daily 90 tablet 3  ? propranolol (INDERAL) 10 MG tablet Take 1 tablet (10 mg total) by mouth 3 (three) times daily as needed (PALPITATIONS). 60 tablet 3  ? Semaglutide (RYBELSUS) 7 MG TABS Take 7 mg by mouth daily. 90 tablet 1  ? SUMAtriptan-naproxen (TREXIMET) 85-500 MG tablet Take 1 pill as needed for migraine, if migraine persists may repeat x1 after 2 hours 10 tablet 8  ? Tart Cherry (TART CHERRY ULTRA) 1200 MG CAPS Take by mouth.    ? tiZANidine (ZANAFLEX) 4 MG tablet Take 1 tablet (4 mg total) by mouth at bedtime. 30 tablet 0  ? Turmeric (QC TUMERIC COMPLEX PO) Take by mouth. 1200 mg daily    ? venlafaxine XR (EFFEXOR-XR) 150 MG 24 hr capsule Take 1 capsule (150 mg total) by mouth 2 (two) times daily. 180 capsule 2  ? Vitamin D, Ergocalciferol, (DRISDOL) 1.25 MG (50000 UNIT) CAPS capsule Take 1 capsule (50,000  Units total) by mouth every 7 (seven) days. 12 capsule 0  ? rosuvastatin (CRESTOR) 20 MG tablet Take 1 tablet (20 mg total) by mouth daily. 90 tablet 3  ? ?No facility-administered medications prior to visit.  ? ? ? ?Review of Systems:  ? ?Constitutional: No weight loss or gain, night sweats, fevers, chills. +occasional fatigue ?HEENT: No headaches, difficulty swallowing, tooth/dental problems, or sore throat. No sneezing, itching, ear ache. +nasal congestion, chronic ?CV:  No chest pain, orthopnea, PND, swelling in lower extremities, anasarca, dizziness, palpitations, syncope ?Resp: No shortness of breath with exertion or at rest. No excess mucus or change in color of mucus. No productive or non-productive. No hemoptysis. No wheezing.  No chest wall deformity ?Skin: No rash, lesions, ulcerations ?Neuro: No dizziness or lightheadedness.  ?Psych: No depression or anxiety. Mood stable.  ? ? ? ?Physical Exam: ? ?BP 122/78 (  BP Location: Right Arm, Patient Position: Sitting, Cuff Size: Normal)   Pulse 77   Temp 98.7 ?F (37.1 ?C) (Oral)   Ht 5' 6"  (1.676 m)   Wt 239 lb (108.4 kg)   SpO2 97%   BMI 38.58 kg/m?  ? ?GEN: Pleasant, interactive, well-appearing; obese; in no acute distress ?HEENT:  Normocephalic and atraumatic. PERRLA. Sclera white. Nasal turbinates erythematous, moist and patent bilaterally. No rhinorrhea present. Oropharynx pink and moist, without exudate or edema. No lesions, ulcerations, or postnasal drip.  ?NECK:  Supple w/ fair ROM. No JVD present. Normal carotid impulses w/o bruits. Thyroid symmetrical with no goiter or nodules palpated. No lymphadenopathy.   ?CV: RRR, no m/r/g, no peripheral edema. Pulses intact, +2 bilaterally. No cyanosis, pallor or clubbing. ?PULMONARY:  Unlabored, regular breathing. Clear bilaterally A&P w/o wheezes/rales/rhonchi. No accessory muscle use. No dullness to percussion. ?GI: BS present and normoactive. Soft, non-tender to palpation.  ?Neuro: A/Ox3. No focal deficits  noted.   ?Psych: Normal affect and behavior. Judgement and thought content appropriate.  ? ? ? ?Lab Results: ? ?CBC ?   ?Component Value Date/Time  ? WBC 7.8 02/02/2021 1120  ? RBC 4.29 02/02/2021 1120  ? HGB 12.1 0

## 2022-01-01 ENCOUNTER — Telehealth: Payer: Self-pay

## 2022-01-01 NOTE — Telephone Encounter (Signed)
Pt is requesting a refill: ?atenolol (TENORMIN) 25 MG tablet ?furosemide (LASIX) 40 MG tablet ? ?Pharmacy[place] [id number] [place] ? ?30 day supply/ remainder of refills go to mail pharmacy ? ? ?

## 2022-01-04 ENCOUNTER — Other Ambulatory Visit: Payer: Self-pay

## 2022-01-04 MED ORDER — FUROSEMIDE 40 MG PO TABS: 40.0000 mg | ORAL_TABLET | Freq: Every day | ORAL | 0 refills | Status: DC

## 2022-01-04 MED ORDER — ATENOLOL 25 MG PO TABS: ORAL_TABLET | ORAL | 0 refills | Status: DC

## 2022-01-04 NOTE — Telephone Encounter (Signed)
30 day sent to local. ? ?Will send in mail order tomorrow ?

## 2022-01-05 ENCOUNTER — Other Ambulatory Visit: Payer: Self-pay

## 2022-01-05 MED ORDER — ATENOLOL 25 MG PO TABS: ORAL_TABLET | ORAL | 1 refills | Status: DC

## 2022-01-05 MED ORDER — FUROSEMIDE 40 MG PO TABS: 40.0000 mg | ORAL_TABLET | Freq: Every day | ORAL | 2 refills | Status: DC

## 2022-01-05 NOTE — Telephone Encounter (Signed)
Sent in today for patient. 

## 2022-01-07 ENCOUNTER — Other Ambulatory Visit: Payer: Self-pay

## 2022-01-07 MED ORDER — FUROSEMIDE 40 MG PO TABS: 40.0000 mg | ORAL_TABLET | Freq: Every day | ORAL | 0 refills | Status: DC

## 2022-01-07 MED ORDER — ATENOLOL 25 MG PO TABS: ORAL_TABLET | ORAL | 0 refills | Status: DC

## 2022-01-14 DIAGNOSIS — G4733 Obstructive sleep apnea (adult) (pediatric): Secondary | ICD-10-CM | POA: Diagnosis not present

## 2022-01-16 ENCOUNTER — Other Ambulatory Visit: Payer: Self-pay | Admitting: Family Medicine

## 2022-01-25 ENCOUNTER — Telehealth: Payer: Medicare Other

## 2022-01-25 NOTE — Progress Notes (Deleted)
? ?Chronic Care Management ?Pharmacy Note ? ?01/25/2022 ?Name:  Erica [name] MRN:  [id number] DOB:  [birth year] ? ?Summary: ?-Pt reports increased palpitations, sometimes waking her up at night.  ?-Pt reports constipation is unchanged with Linzess and stool softener ? ?Recommendations/Changes made from today's visit: ?-Advised pt to f/u with cardiologist to discuss palpitations ?-Start Miralax once daily, may titrate up to TID if needed ? ? ?Subjective: ?Erica [name] is an 66 y.o. year old female who is a primary patient of Burns, Claudina Lick, MD.  The CCM team was consulted for assistance with disease management and care coordination needs.   ? ?Engaged with patient by telephone for follow up visit in response to provider referral for pharmacy case management and/or care coordination services.  ? ?Consent to Services:  ?The patient was given information about Chronic Care Management services, agreed to services, and gave verbal consent prior to initiation of services.  Please see initial visit note for detailed documentation.  ? ?Patient Care Team: ?[name], MD as PCP - General (Internal Medicine) ?Minus Breeding, MD as PCP - Cardiology (Cardiology) ?Lafayette Dragon, MD (Inactive) as Consulting Physician (Gastroenterology) ?Azucena Fallen, MD as Consulting Physician (Obstetrics and Gynecology) ?Suella Broad, MD as Consulting Physician (Physical Medicine and Rehabilitation) ?Gaynelle Arabian, MD as Consulting Physician (Orthopedic Surgery) ?Chesley Mires, MD (Pulmonary Disease) ?Foltanski, Cleaster Corin, [facility] as Pharmacist (Pharmacist) ? ? ?Patient lives at home with husband. She is retired and has avoided public places during the pandemic. She is thinking about going back to the aquatic center for exercise soon. ? ?Recent office visits: ?11/11/2021 - Dr. Quay Burow - increase rybelsus to 9m daily f/u in 6 months  ?08/10/2021 - Dr. BQuay Burow- farxiga started, ozempic stopped,  rybelsus started  ? ?Recent consult  visits: ?12/30/2021 - KMarland KitchenNP - Pulmonology - OSA f/u - CPAP pressure adjusted to 5-15cmH2O - f/u in 3 months  ?12/10/2021 - Urgent Care - Sore throat - no changes to medications  ?11/10/2021 - Dr. HStanford Breed- Vascular Surgery - chronic pain of both knees - no changes to medications  ?11/02/2021 - Dr. HPercival Spanish- Cardiology - no changes to medications - f/u in 1 year  ?10/26/2021 - Urgent Care - Fall - x-ray w/o fracture - no changes to medications  ?09/29/2021 - Dr. STamala Julian- Sports Medicine - right knee pina / bilateral hip pain - bilateral injections given  - tizanidine rx'd - f/u in 8 weeks  ? ?Hospital visits: ?None in previous 6 months ? ?Objective: ? ?Lab Results  ?Component Value Date  ? CREATININE 0.82 11/11/2021  ? BUN 12 11/11/2021  ? GFR 73.74 11/11/2021  ? GFRNONAA 80 06/02/2020  ? GFRAA 93 06/02/2020  ? NA 140 11/11/2021  ? K 3.7 11/11/2021  ? CALCIUM 10.1 11/11/2021  ? CO2 33 (H) 11/11/2021  ? GLUCOSE 95 11/11/2021  ? ? ?Lab Results  ?Component Value Date/Time  ? HGBA1C 6.3 11/11/2021 11:46 AM  ? HGBA1C 6.5 08/10/2021 12:17 PM  ? GFR 73.74 11/11/2021 11:46 AM  ? GFR 77.18 09/29/2021 10:29 AM  ? MICROALBUR 0.8 11/11/2021 11:46 AM  ? MICROALBUR 1.2 12/08/2020 11:56 AM  ?  ?Last diabetic Eye exam:  ?Lab Results  ?Component Value Date/Time  ? HMDIABEYEEXA No Retinopathy 04/15/2021 09:24 AM  ?  ?Last diabetic Foot exam: No results found for: HMDIABFOOTEX  ? ?Lab Results  ?Component Value Date  ? CHOL 144 11/11/2021  ? HDL 52.80 11/11/2021  ? LRoxton71 11/11/2021  ?  TRIG 101.0 11/11/2021  ? CHOLHDL 3 11/11/2021  ? ? ? ?  Latest Ref Rng & Units 11/11/2021  ? 11:46 AM 09/29/2021  ? 10:29 AM 08/10/2021  ? 12:17 PM  ?Hepatic Function  ?Total Protein 6.0 - 8.3 g/dL 7.4   7.0   7.3    ?Albumin 3.5 - 5.2 g/dL 4.0   3.8   4.0    ?AST 0 - 37 U/L '17   17   16    ' ?ALT 0 - 35 U/L '14   12   13    ' ?Alk Phosphatase 39 - 117 U/L 136   123   133    ?Total Bilirubin 0.2 - 1.2 mg/dL 0.3   0.3   0.3    ? ? ?Lab Results   ?Component Value Date/Time  ? TSH 2.26 02/02/2021 11:20 AM  ? TSH 2.717 05/11/2019 09:19 AM  ? TSH 2.020 02/09/2018 10:53 AM  ? FREET4 1.06 02/09/2018 10:53 AM  ? ? ? ?  Latest Ref Rng & Units 02/02/2021  ? 11:20 AM 08/23/2019  ?  1:50 PM 05/11/2019  ?  9:19 AM  ?CBC  ?WBC 4.0 - 10.5 K/uL 7.8   7.4   7.2    ?Hemoglobin 12.0 - 15.0 g/dL 12.1   12.2   13.0    ?Hematocrit 36.0 - 46.0 % 36.7   37.2   40.8    ?Platelets 150.0 - 400.0 K/uL 287.0   208.0   272    ? ? ?Lab Results  ?Component Value Date/Time  ? VD25OH 37.76 09/29/2021 10:29 AM  ? VD25OH 64.84 02/02/2021 11:20 AM  ? ? ?Clinical ASCVD: No  ?The 10-year ASCVD risk score (Arnett DK, et al., 2019) is: 16.6% ?  Values used to calculate the score: ?    Age: 40 years ?    Sex: Female ?    Is Non-Hispanic African American: Yes ?    Diabetic: Yes ?    Tobacco smoker: No ?    Systolic Blood Pressure: 945 mmHg ?    Is BP treated: Yes ?    HDL Cholesterol: 52.8 mg/dL ?    Total Cholesterol: 144 mg/dL   ? ? ?  02/06/2021  ? 10:20 AM 01/01/2021  ? 11:33 AM 12/08/2020  ? 12:41 PM  ?Depression screen PHQ 2/9  ?Decreased Interest 0 0 1  ?Down, Depressed, Hopeless 0 1 1  ?PHQ - 2 Score 0 1 2  ?Altered sleeping   2  ?Tired, decreased energy   2  ?Change in appetite   2  ?Feeling bad or failure about yourself    3  ?Trouble concentrating   2  ?Moving slowly or fidgety/restless   0  ?Suicidal thoughts   0  ?PHQ-9 Score   13  ?Difficult doing work/chores   Somewhat difficult  ?  ? ?  09/27/2019  ?  1:57 PM 09/14/2019  ? 11:51 AM 07/05/2018  ?  9:18 AM 06/07/2018  ? 10:04 AM  ?GAD 7 : Generalized Anxiety Score  ?Nervous, Anxious, on Edge  '1 1 1  ' ?Control/stop worrying  '1 1 1  ' ?Worry too much - different things  '2 2 1  ' ?Trouble relaxing  '1 1 1  ' ?Restless  1 1 0  ?Easily annoyed or irritable  2 0 1  ?Afraid - awful might happen  1 0 1  ?Total GAD 7 Score  '9 6 6  ' ?Anxiety Difficulty   Not difficult at all  Somewhat difficult  ?  ? Information is confidential and restricted. Go to Review  Flowsheets to unlock data.  ? ? ?Social History  ? ?Tobacco Use  ?Smoking Status Never  ?Smokeless Tobacco Never  ?Tobacco Comments  ? tried to as a teenager  ? ?BP Readings from Last 3 Encounters:  ?12/30/21 122/78  ?12/10/21 127/82  ?11/11/21 132/74  ? ?Pulse Readings from Last 3 Encounters:  ?12/30/21 77  ?12/10/21 73  ?11/11/21 68  ? ?Wt Readings from Last 3 Encounters:  ?12/30/21 239 lb (108.4 kg)  ?11/11/21 240 lb (108.9 kg)  ?11/10/21 242 lb (109.8 kg)  ? ?BMI Readings from Last 3 Encounters:  ?12/30/21 38.58 kg/m?  ?11/11/21 38.74 kg/m?  ?11/10/21 39.06 kg/m?  ? ? ?Assessment/Interventions: Review of patient past medical history, allergies, medications, health status, including review of consultants reports, laboratory and other test data, was performed as part of comprehensive evaluation and provision of chronic care management services.  ? ?SDOH:  (Social Determinants of Health) assessments and interventions performed: Yes ? ? ?SDOH Screenings  ? ?Alcohol Screen: Low Risk   ? Last Alcohol Screening Score (AUDIT): 2  ?Depression (PHQ2-9): Low Risk   ? PHQ-2 Score: 0  ?Financial Resource Strain: Low Risk   ? Difficulty of Paying Living Expenses: Not hard at all  ?Food Insecurity: No Food Insecurity  ? Worried About Charity fundraiser in the Last Year: Never true  ? Ran Out of Food in the Last Year: Never true  ?Housing: Low Risk   ? Last Housing Risk Score: 0  ?Physical Activity: Inactive  ? Days of Exercise per Week: 0 days  ? Minutes of Exercise per Session: 0 min  ?Social Connections: Socially Integrated  ? Frequency of Communication with Friends and Family: More than three times a week  ? Frequency of Social Gatherings with Friends and Family: Once a week  ? Attends Religious Services: More than 4 times per year  ? Active Member of Clubs or Organizations: No  ? Attends Archivist Meetings: More than 4 times per year  ? Marital Status: Married  ?Stress: No Stress Concern Present  ? Feeling of  Stress : Not at all  ?Tobacco Use: Low Risk   ? Smoking Tobacco Use: Never  ? Smokeless Tobacco Use: Never  ? Passive Exposure: Not on file  ?Transportation Needs: No Transportation Needs  ? Lack of Transportation (Medical)

## 2022-01-30 ENCOUNTER — Other Ambulatory Visit: Payer: Self-pay | Admitting: Internal Medicine

## 2022-02-05 ENCOUNTER — Other Ambulatory Visit: Payer: Self-pay | Admitting: Otolaryngology

## 2022-02-09 ENCOUNTER — Other Ambulatory Visit: Payer: Self-pay | Admitting: Otolaryngology

## 2022-02-09 NOTE — Progress Notes (Signed)
? ? ?Subjective:  ? ? Patient ID: Erica [name], female    DOB: 1956-07-02, 66 y.o.   MRN: [id number] ? ?This visit occurred during the SARS-CoV-2 public health emergency.  Safety protocols were in place, including screening questions prior to the visit, additional usage of staff PPE, and extensive cleaning of exam room while observing appropriate contact time as indicated for disinfecting solutions. ? ? ? ?HPI ?Erica [name] is here for  ?Chief Complaint  ?Patient presents with  ? Gastroesophageal Reflux  ? Abdominal Pain  ? ? ? ?Stomach issues:  her throat has been burning and her stomach is burning in her epigastric region. This started Monday.  She did eat sauce on Monday.  She denies abdominal pain ? ? ?Itching: she has chronic itching.  She has very dry skin - uses vaseline.  She is taking hydroxyzine.   ? ?A few days ago she was eating a pack of peanut butter crackers and developed hives.  She denies any issues with allergies to nuts in the past.  She did not have any swelling in her throat, sore throat, cough or breathing difficulties.  She thinks it was related to that, but has had a lot of itching from the dry skin as well. ? ? ? ?Medications and allergies reviewed with patient and updated if appropriate. ? ?Current Outpatient Medications on File Prior to Visit  ?Medication Sig Dispense Refill  ? albuterol (VENTOLIN HFA) 108 (90 Base) MCG/ACT inhaler Inhale 2 puffs into the lungs every 6 (six) hours as needed for wheezing or shortness of breath. 16 g 3  ? ALPRAZolam (XANAX) 1 MG tablet Take 1 mg by mouth as needed for anxiety.    ? AMBULATORY NON FORMULARY MEDICATION 4 Prong cane 1 Units 0  ? atenolol (TENORMIN) 25 MG tablet TAKE ONE TABLET BY MOUTH IN THE MORNING AND TAKE TWO TABLETS IN THE EVENING 90 tablet 2  ? blood glucose meter kit and supplies KIT Dispense based on patient and insurance preference. Use up to four times daily as directed. (FOR E11.9). 1 each 0  ? dapagliflozin propanediol (FARXIGA) 10  MG TABS tablet Take 1 tablet (10 mg total) by mouth daily before breakfast. 90 tablet 1  ? fluticasone (FLONASE) 50 MCG/ACT nasal spray Place 2 sprays into both nostrils daily. 16 g 0  ? furosemide (LASIX) 40 MG tablet TAKE 1 TABLET BY MOUTH EVERY DAY 30 tablet 0  ? levocetirizine (XYZAL) 5 MG tablet Take 1 tablet (5 mg total) by mouth every evening. 90 tablet 2  ? linaclotide (LINZESS) 290 MCG CAPS capsule Take 1 capsule (290 mcg total) by mouth daily before breakfast. 90 capsule 2  ? Multiple Vitamins-Minerals (MULTIVITAMIN WITH MINERALS) tablet Take 1 tablet by mouth daily.    ? omeprazole (PRILOSEC) 20 MG capsule Take 1 capsule (20 mg total) by mouth daily. 90 capsule 2  ? potassium chloride SA (KLOR-CON) 20 MEQ tablet Take 1 tablet (20 mEq total) by mouth daily. 1 tablet by mouth daily 90 tablet 3  ? propranolol (INDERAL) 10 MG tablet Take 1 tablet (10 mg total) by mouth 3 (three) times daily as needed (PALPITATIONS). 60 tablet 3  ? Semaglutide (RYBELSUS) 7 MG TABS Take 7 mg by mouth daily. 90 tablet 1  ? SUMAtriptan-naproxen (TREXIMET) 85-500 MG tablet Take 1 pill as needed for migraine, if migraine persists may repeat x1 after 2 hours 10 tablet 8  ? Tart Cherry (TART CHERRY ULTRA) 1200 MG CAPS Take by mouth.    ?  tiZANidine (ZANAFLEX) 4 MG tablet Take 1 tablet (4 mg total) by mouth at bedtime. 30 tablet 0  ? Turmeric (QC TUMERIC COMPLEX PO) Take by mouth. 1200 mg daily    ? venlafaxine XR (EFFEXOR-XR) 150 MG 24 hr capsule Take 1 capsule (150 mg total) by mouth 2 (two) times daily. 180 capsule 2  ? Vitamin D, Ergocalciferol, (DRISDOL) 1.25 MG (50000 UNIT) CAPS capsule TAKE 1 CAPSULE (50,000 UNITS TOTAL) BY MOUTH EVERY 7 (SEVEN) DAYS 12 capsule 0  ? rosuvastatin (CRESTOR) 20 MG tablet Take 1 tablet (20 mg total) by mouth daily. 90 tablet 3  ? [DISCONTINUED] oxycodone (OXY-IR) 5 MG capsule Take 5 mg by mouth every 4 (four) hours as needed. For pain.    ? ?No current facility-administered medications on file  prior to visit.  ? ? ?Review of Systems  ?Constitutional:  Positive for fatigue. Negative for fever.  ?HENT:  Negative for sore throat.   ?Cardiovascular:  Positive for palpitations. Negative for chest pain.  ?Gastrointestinal:  Positive for constipation (controlled). Negative for abdominal pain, blood in stool, diarrhea and nausea.  ?     Jerrye Bushy  ?Skin:   ?     Hives a few days ago.  Itching allover - chronic  ?Neurological:  Positive for headaches (occ).  ? ?   ?Objective:  ? ?Vitals:  ? 02/10/22 0847  ?BP: 118/78  ?Pulse: 77  ?Temp: 98.6 ?F (37 ?C)  ?SpO2: 96%  ? ?BP Readings from Last 3 Encounters:  ?02/10/22 118/78  ?12/30/21 122/78  ?12/10/21 127/82  ? ?Wt Readings from Last 3 Encounters:  ?02/10/22 233 lb (105.7 kg)  ?12/30/21 239 lb (108.4 kg)  ?11/11/21 240 lb (108.9 kg)  ? ?Body mass index is 37.61 kg/m?. ? ?  ?Physical Exam ?Constitutional:   ?   General: She is not in acute distress. ?   Appearance: Normal appearance.  ?HENT:  ?   Head: Normocephalic and atraumatic.  ?Eyes:  ?   Conjunctiva/sclera: Conjunctivae normal.  ?Cardiovascular:  ?   Rate and Rhythm: Normal rate and regular rhythm.  ?   Heart sounds: Normal heart sounds. No murmur heard. ?Pulmonary:  ?   Effort: Pulmonary effort is normal. No respiratory distress.  ?   Breath sounds: Normal breath sounds. No wheezing.  ?Musculoskeletal:  ?   Cervical back: Neck supple.  ?   Right lower leg: No edema.  ?   Left lower leg: No edema.  ?Lymphadenopathy:  ?   Cervical: No cervical adenopathy.  ?Skin: ?   General: Skin is warm and dry.  ?   Findings: No rash.  ?   Comments: Dry skin   ?Neurological:  ?   Mental Status: She is alert. Mental status is at baseline.  ?Psychiatric:     ?   Mood and Affect: Mood normal.     ?   Behavior: Behavior normal.  ? ?   ? ? ? ? ? ?Assessment & Plan:  ? ? ?See Problem List for Assessment and Plan of chronic medical problems.  ? ? ? ? ?

## 2022-02-10 ENCOUNTER — Encounter: Payer: Self-pay | Admitting: Internal Medicine

## 2022-02-10 ENCOUNTER — Telehealth: Payer: Self-pay

## 2022-02-10 ENCOUNTER — Ambulatory Visit (INDEPENDENT_AMBULATORY_CARE_PROVIDER_SITE_OTHER): Payer: Medicare Other | Admitting: Internal Medicine

## 2022-02-10 VITALS — BP 118/78 | HR 77 | Temp 98.6°F | Ht 66.0 in | Wt 233.0 lb

## 2022-02-10 DIAGNOSIS — L299 Pruritus, unspecified: Secondary | ICD-10-CM

## 2022-02-10 DIAGNOSIS — E538 Deficiency of other specified B group vitamins: Secondary | ICD-10-CM | POA: Diagnosis not present

## 2022-02-10 DIAGNOSIS — K219 Gastro-esophageal reflux disease without esophagitis: Secondary | ICD-10-CM | POA: Diagnosis not present

## 2022-02-10 MED ORDER — FLUTICASONE PROPIONATE 50 MCG/ACT NA SUSP: 2.0000 | Freq: Every day | NASAL | 11 refills | Status: DC

## 2022-02-10 MED ORDER — OMEPRAZOLE 20 MG PO CPDR: 20.0000 mg | DELAYED_RELEASE_CAPSULE | Freq: Two times a day (BID) | ORAL | 2 refills | Status: DC

## 2022-02-10 MED ORDER — CYANOCOBALAMIN 1000 MCG/ML IJ SOLN
1000.0000 ug | Freq: Once | INTRAMUSCULAR | Status: AC
  Administered 2022-02-10: 1000 ug via INTRAMUSCULAR

## 2022-02-10 NOTE — Assessment & Plan Note (Signed)
Chronic ?B12 injection today ?Recommended coming in on a monthly basis for her B12 injections, which she needs-she has been coming in ear regularly and she does state fatigue and this may be contributing to that ?Advised to schedule B12 injection monthly ?

## 2022-02-10 NOTE — Patient Instructions (Addendum)
? ? ? ? ? ?  Medications changes include :   try Vanicream, eucerin, CeraVe,Aveeno anti-itch cream.  Increase the omeprazole to twice a day - once your reflux is well controlled for a while decrease this back to once a day. ? ? ?Your prescription(s) have been sent to your pharmacy.  ? ? ?A referral was ordered for Dermatology.     Someone from that office will call you to schedule an appointment.  ?

## 2022-02-10 NOTE — Telephone Encounter (Signed)
Called patient to speak about prescription refill. Left a message ?

## 2022-02-10 NOTE — Assessment & Plan Note (Signed)
Chronic ?Not controlled ?Current symptoms suggest uncontrolled reflux likely from having red sauce ?Increase omeprazole to 20 mg twice daily ?Once reflux is well controlled she can try decreasing the omeprazole back to once a day ?Discussed that she may need to do this intermittently if she has a flare, but to always try to decrease back to the lowest dose ? ? ?

## 2022-02-10 NOTE — Assessment & Plan Note (Signed)
Subacute ?Has experienced whole body itching for a while ?Currently taking hydroxyzine as needed which helps ?Using Vaseline on her skin ?Advised trying a different over-the-counter cream for itching-anti-itch cream made by either Aveeno, CeraVe, Eucerin, Vanicream ?Referral order for dermatology ?

## 2022-02-10 NOTE — Telephone Encounter (Signed)
Patient came in office and is requesting a refill on both Vitamin D and muscle relaxer medications. ? ?Thank you. ?

## 2022-02-12 ENCOUNTER — Telehealth: Payer: Self-pay

## 2022-02-12 ENCOUNTER — Ambulatory Visit: Payer: Medicare Other

## 2022-02-12 NOTE — Telephone Encounter (Signed)
Called Patient x 3 no answer, no voice mail.  Patient may reschedule for next available appointment. ? ?L.Alyas Creary,LPN ?

## 2022-02-13 DIAGNOSIS — G4733 Obstructive sleep apnea (adult) (pediatric): Secondary | ICD-10-CM | POA: Diagnosis not present

## 2022-02-15 ENCOUNTER — Other Ambulatory Visit: Payer: Self-pay | Admitting: Internal Medicine

## 2022-02-16 NOTE — Telephone Encounter (Signed)
R/S appt

## 2022-02-19 ENCOUNTER — Telehealth: Payer: Self-pay

## 2022-02-19 ENCOUNTER — Ambulatory Visit: Payer: Medicare Other

## 2022-02-19 NOTE — Telephone Encounter (Signed)
Please call pt to schedule or reschedule AWV. Thanks  ? ?Pt had a 915 appt today and no one called her. Spoke with on site AWV nurse and she stated that she wasn't on her schedule to call.  ? ?Please advise ?

## 2022-02-25 ENCOUNTER — Ambulatory Visit: Payer: Medicare Other

## 2022-02-25 DIAGNOSIS — G4733 Obstructive sleep apnea (adult) (pediatric): Secondary | ICD-10-CM | POA: Insufficient documentation

## 2022-02-26 ENCOUNTER — Ambulatory Visit: Admit: 2022-02-26 | Payer: Medicare Other | Admitting: Otolaryngology

## 2022-02-26 SURGERY — EXCISION, NASAL TURBINATE, SUBMUCOSAL
Anesthesia: General | Laterality: Bilateral

## 2022-03-03 ENCOUNTER — Other Ambulatory Visit: Payer: Self-pay

## 2022-03-03 ENCOUNTER — Encounter (HOSPITAL_COMMUNITY): Payer: Self-pay | Admitting: Otolaryngology

## 2022-03-03 NOTE — Progress Notes (Signed)
PCP - Dr. Jenness Corner  Cardiologist - Dr. Percival Spanish  EP- Denies  Endocrine- Denies  Pulm- Denies  Chest x-ray - Denies  EKG - 06/08/21 (E)  Stress Test - 09/23/16 (E)  ECHO - 10/21/16 (E)  Cardiac Cath - Denies  AICD-na PM-na LOOP-na  Nerve Stimulator- Denies  Dialysis- Denies  Sleep Study - Yes- Positive CPAP - No  LABS- 03/04/22: CBC, BMP  ASA- Denies  ERAS-No  HA1C- 11/11/21(E): 6.3 Fasting Blood Sugar - 110-160 Checks Blood Sugar __1___ time a day  Anesthesia- No  Pt denies having chest pain, sob, or fever during the pre-op phone call. All instructions explained to the pt, with a verbal understanding of the material including: as of today,  stop taking all Aspirin (unless instructed by your doctor) and Other Aspirin containing products, Vitamins, Fish oils, and Herbal medications. Also stop all NSAIDS i.e. Advil, Ibuprofen, Motrin, Aleve, Anaprox, Naproxen, BC, Goody Powders, and all Supplements.    WHAT DO I DO ABOUT MY DIABETES MEDICATION?  Do not take Farxiga and Rybelsus the morning of surgery.  If your CBG is greater than 220 mg/dL, call the number below for further instructions.  How to Manage Your Diabetes Before and After Surgery  How do I manage my blood sugar before surgery? Check your blood sugar the morning of your surgery when you wake up and every 2 hours until you get to the Short Stay unit. If your blood sugar is less than 70 mg/dL, you will need to treat for low blood sugar: Do not take insulin. Treat a low blood sugar (less than 70 mg/dL) with  cup of clear juice (cranberry or apple), 4 glucose tablets, OR glucose gel. Recheck blood sugar in 15 minutes after treatment (to make sure it is greater than 70 mg/dL). If your blood sugar is not greater than 70 mg/dL on recheck, call [phone]  for further instructions. Report your blood sugar to the short stay nurse when you get to Short Stay.  Reviewed and Endorsed by [facility] Patient  Education Committee, August 2015  Pt also instructed to wear a mask and social distance if she goes out. The opportunity to ask questions was provided.

## 2022-03-04 ENCOUNTER — Ambulatory Visit (HOSPITAL_BASED_OUTPATIENT_CLINIC_OR_DEPARTMENT_OTHER): Payer: Medicare Other | Admitting: Certified Registered Nurse Anesthetist

## 2022-03-04 ENCOUNTER — Encounter (HOSPITAL_COMMUNITY)
Admission: RE | Disposition: A | Discharge status: Home / Self Care | Payer: Self-pay | Source: Home / Self Care | Attending: Otolaryngology

## 2022-03-04 ENCOUNTER — Observation Stay (HOSPITAL_COMMUNITY)
Admission: RE | Admit: 2022-03-04 | Discharge: 2022-03-05 | Disposition: A | Discharge status: Home / Self Care | Payer: Medicare Other | Attending: Otolaryngology | Admitting: Otolaryngology

## 2022-03-04 ENCOUNTER — Encounter (HOSPITAL_COMMUNITY): Payer: Self-pay | Admitting: Otolaryngology

## 2022-03-04 ENCOUNTER — Ambulatory Visit (HOSPITAL_COMMUNITY): Payer: Medicare Other | Admitting: Certified Registered Nurse Anesthetist

## 2022-03-04 ENCOUNTER — Other Ambulatory Visit: Payer: Self-pay

## 2022-03-04 DIAGNOSIS — J329 Chronic sinusitis, unspecified: Secondary | ICD-10-CM | POA: Diagnosis not present

## 2022-03-04 DIAGNOSIS — J3489 Other specified disorders of nose and nasal sinuses: Secondary | ICD-10-CM

## 2022-03-04 DIAGNOSIS — E119 Type 2 diabetes mellitus without complications: Secondary | ICD-10-CM | POA: Insufficient documentation

## 2022-03-04 DIAGNOSIS — Z96651 Presence of right artificial knee joint: Secondary | ICD-10-CM | POA: Insufficient documentation

## 2022-03-04 DIAGNOSIS — Z79899 Other long term (current) drug therapy: Secondary | ICD-10-CM | POA: Insufficient documentation

## 2022-03-04 DIAGNOSIS — J343 Hypertrophy of nasal turbinates: Secondary | ICD-10-CM | POA: Diagnosis not present

## 2022-03-04 DIAGNOSIS — I10 Essential (primary) hypertension: Secondary | ICD-10-CM | POA: Diagnosis not present

## 2022-03-04 DIAGNOSIS — J342 Deviated nasal septum: Secondary | ICD-10-CM | POA: Diagnosis not present

## 2022-03-04 HISTORY — PX: NASAL TURBINATE REDUCTION: SHX2072

## 2022-03-04 LAB — CBC
HCT: 39.3 % (ref 36.0–46.0)
Hemoglobin: 12.9 g/dL (ref 12.0–15.0)
MCH: 28.6 pg (ref 26.0–34.0)
MCHC: 32.8 g/dL (ref 30.0–36.0)
MCV: 87.1 fL (ref 80.0–100.0)
Platelets: 267 10*3/uL (ref 150–400)
RBC: 4.51 MIL/uL (ref 3.87–5.11)
RDW: 14 % (ref 11.5–15.5)
WBC: 6.2 10*3/uL (ref 4.0–10.5)
nRBC: 0 % (ref 0.0–0.2)

## 2022-03-04 LAB — BASIC METABOLIC PANEL
Anion gap: 8 (ref 5–15)
BUN: 12 mg/dL (ref 8–23)
CO2: 25 mmol/L (ref 22–32)
Calcium: 9.4 mg/dL (ref 8.9–10.3)
Chloride: 106 mmol/L (ref 98–111)
Creatinine, Ser: 0.81 mg/dL (ref 0.44–1.00)
GFR, Estimated: >60 mL/min (ref >60)
Glucose, Bld: 115 mg/dL — ABNORMAL HIGH (ref 70–99)
Potassium: 3.4 mmol/L — ABNORMAL LOW (ref 3.5–5.1)
Sodium: 139 mmol/L (ref 135–145)

## 2022-03-04 LAB — NO BLOOD PRODUCTS

## 2022-03-04 LAB — GLUCOSE, CAPILLARY
Glucose-Capillary: 127 mg/dL — ABNORMAL HIGH (ref 70–99)
Glucose-Capillary: 101 mg/dL — ABNORMAL HIGH (ref 70–99)
Glucose-Capillary: 139 mg/dL — ABNORMAL HIGH (ref 70–99)
Glucose-Capillary: 181 mg/dL — ABNORMAL HIGH (ref 70–99)

## 2022-03-04 SURGERY — EXCISION, NASAL TURBINATE, SUBMUCOSAL
Anesthesia: General | Laterality: Bilateral

## 2022-03-04 MED ORDER — TIZANIDINE HCL 4 MG PO TABS: 4.0000 mg | ORAL_TABLET | Freq: Every day | ORAL | Status: DC | PRN

## 2022-03-04 MED ORDER — PROPRANOLOL HCL 10 MG PO TABS: 10.0000 mg | ORAL_TABLET | Freq: Three times a day (TID) | ORAL | Status: DC | PRN

## 2022-03-04 MED ORDER — OXYMETAZOLINE HCL 0.05 % NA SOLN
NASAL | Status: DC | PRN
  Administered 2022-03-04: 1 via TOPICAL

## 2022-03-04 MED ORDER — ROCURONIUM BROMIDE 10 MG/ML (PF) SYRINGE
PREFILLED_SYRINGE | INTRAVENOUS | Status: AC
  Filled 2022-03-04: qty 10

## 2022-03-04 MED ORDER — ORAL CARE MOUTH RINSE: 15.0000 mL | Freq: Once | OROMUCOSAL | Status: AC

## 2022-03-04 MED ORDER — FENTANYL CITRATE (PF) 100 MCG/2ML IJ SOLN
25.0000 ug | INTRAMUSCULAR | Status: DC | PRN
  Administered 2022-03-04 (×2): 50 ug via INTRAVENOUS

## 2022-03-04 MED ORDER — HYDROXYZINE HCL 25 MG PO TABS
25.0000 mg | ORAL_TABLET | Freq: Three times a day (TID) | ORAL | Status: DC | PRN
  Administered 2022-03-04: 25 mg via ORAL
  Filled 2022-03-04: qty 1

## 2022-03-04 MED ORDER — FENTANYL CITRATE (PF) 250 MCG/5ML IJ SOLN
INTRAMUSCULAR | Status: AC
  Filled 2022-03-04: qty 5

## 2022-03-04 MED ORDER — ROCURONIUM BROMIDE 10 MG/ML (PF) SYRINGE
PREFILLED_SYRINGE | INTRAVENOUS | Status: DC | PRN
  Administered 2022-03-04: 70 mg via INTRAVENOUS

## 2022-03-04 MED ORDER — SUMATRIPTAN-NAPROXEN SODIUM 85-500 MG PO TABS: 1.0000 | ORAL_TABLET | Freq: Once | ORAL | Status: DC | PRN

## 2022-03-04 MED ORDER — MIDAZOLAM HCL 2 MG/2ML IJ SOLN
INTRAMUSCULAR | Status: DC | PRN
  Administered 2022-03-04: 2 mg via INTRAVENOUS

## 2022-03-04 MED ORDER — POTASSIUM CHLORIDE CRYS ER 20 MEQ PO TBCR
20.0000 meq | EXTENDED_RELEASE_TABLET | Freq: Every evening | ORAL | Status: DC
  Administered 2022-03-04: 20 meq via ORAL
  Filled 2022-03-04: qty 1

## 2022-03-04 MED ORDER — ALPRAZOLAM 0.25 MG PO TABS
0.5000 mg | ORAL_TABLET | Freq: Two times a day (BID) | ORAL | Status: DC | PRN
  Administered 2022-03-04: 0.5 mg via ORAL
  Filled 2022-03-04: qty 2

## 2022-03-04 MED ORDER — LACTATED RINGERS IV SOLN: INTRAVENOUS | Status: DC

## 2022-03-04 MED ORDER — OXYMETAZOLINE HCL 0.05 % NA SOLN
NASAL | Status: DC | PRN
  Administered 2022-03-04: 2 via NASAL

## 2022-03-04 MED ORDER — PROPOFOL 10 MG/ML IV BOLUS
INTRAVENOUS | Status: AC
  Filled 2022-03-04: qty 20

## 2022-03-04 MED ORDER — ONDANSETRON HCL 4 MG/2ML IJ SOLN
INTRAMUSCULAR | Status: DC | PRN
  Administered 2022-03-04: 4 mg via INTRAVENOUS

## 2022-03-04 MED ORDER — CHLORHEXIDINE GLUCONATE 0.12 % MT SOLN
15.0000 mL | Freq: Once | OROMUCOSAL | Status: AC
  Administered 2022-03-04: 15 mL via OROMUCOSAL
  Filled 2022-03-04: qty 15

## 2022-03-04 MED ORDER — LEVOCETIRIZINE DIHYDROCHLORIDE 5 MG PO TABS: 5.0000 mg | ORAL_TABLET | Freq: Every evening | ORAL | Status: DC

## 2022-03-04 MED ORDER — DAPAGLIFLOZIN PROPANEDIOL 10 MG PO TABS
10.0000 mg | ORAL_TABLET | Freq: Every day | ORAL | Status: DC
  Administered 2022-03-05: 10 mg via ORAL
  Filled 2022-03-04: qty 1

## 2022-03-04 MED ORDER — PHENYLEPHRINE 80 MCG/ML (10ML) SYRINGE FOR IV PUSH (FOR BLOOD PRESSURE SUPPORT)
PREFILLED_SYRINGE | INTRAVENOUS | Status: DC | PRN
  Administered 2022-03-04: 80 ug via INTRAVENOUS

## 2022-03-04 MED ORDER — ATENOLOL 25 MG PO TABS
50.0000 mg | ORAL_TABLET | Freq: Every day | ORAL | Status: DC
  Administered 2022-03-04: 50 mg via ORAL
  Filled 2022-03-04: qty 1

## 2022-03-04 MED ORDER — VENLAFAXINE HCL ER 75 MG PO CP24
150.0000 mg | ORAL_CAPSULE | Freq: Two times a day (BID) | ORAL | Status: DC
  Administered 2022-03-04: 150 mg via ORAL
  Filled 2022-03-04: qty 1

## 2022-03-04 MED ORDER — ACETAMINOPHEN 10 MG/ML IV SOLN
1000.0000 mg | Freq: Once | INTRAVENOUS | Status: DC | PRN
  Administered 2022-03-04: 1000 mg via INTRAVENOUS

## 2022-03-04 MED ORDER — PANTOPRAZOLE SODIUM 40 MG PO TBEC
40.0000 mg | DELAYED_RELEASE_TABLET | Freq: Every day | ORAL | Status: DC
  Administered 2022-03-04: 40 mg via ORAL
  Filled 2022-03-04: qty 1

## 2022-03-04 MED ORDER — FENTANYL CITRATE (PF) 100 MCG/2ML IJ SOLN
INTRAMUSCULAR | Status: AC
  Filled 2022-03-04: qty 2

## 2022-03-04 MED ORDER — LINACLOTIDE 145 MCG PO CAPS: 290.0000 ug | ORAL_CAPSULE | Freq: Every day | ORAL | Status: DC | PRN

## 2022-03-04 MED ORDER — LACTATED RINGERS IV SOLN: INTRAVENOUS | Status: DC | PRN

## 2022-03-04 MED ORDER — ACETAMINOPHEN 500 MG PO TABS: 1000.0000 mg | ORAL_TABLET | Freq: Once | ORAL | Status: DC | PRN

## 2022-03-04 MED ORDER — ATENOLOL 25 MG PO TABS
25.0000 mg | ORAL_TABLET | Freq: Every day | ORAL | Status: DC
  Filled 2022-03-04: qty 1

## 2022-03-04 MED ORDER — ACETAMINOPHEN 160 MG/5ML PO SOLN: 1000.0000 mg | Freq: Once | ORAL | Status: DC | PRN

## 2022-03-04 MED ORDER — LIDOCAINE 2% (20 MG/ML) 5 ML SYRINGE
INTRAMUSCULAR | Status: AC
  Filled 2022-03-04: qty 5

## 2022-03-04 MED ORDER — ONDANSETRON HCL 4 MG/2ML IJ SOLN
INTRAMUSCULAR | Status: AC
  Filled 2022-03-04: qty 2

## 2022-03-04 MED ORDER — ACETAMINOPHEN 10 MG/ML IV SOLN
INTRAVENOUS | Status: AC
  Filled 2022-03-04: qty 100

## 2022-03-04 MED ORDER — POTASSIUM CHLORIDE IN NACL 20-0.45 MEQ/L-% IV SOLN
INTRAVENOUS | Status: DC
  Filled 2022-03-04 (×2): qty 1000

## 2022-03-04 MED ORDER — ALBUTEROL SULFATE HFA 108 (90 BASE) MCG/ACT IN AERS: 2.0000 | INHALATION_SPRAY | Freq: Four times a day (QID) | RESPIRATORY_TRACT | Status: DC | PRN

## 2022-03-04 MED ORDER — DEXAMETHASONE SODIUM PHOSPHATE 10 MG/ML IJ SOLN
INTRAMUSCULAR | Status: DC | PRN
  Administered 2022-03-04: 10 mg via INTRAVENOUS

## 2022-03-04 MED ORDER — FENTANYL CITRATE (PF) 250 MCG/5ML IJ SOLN
INTRAMUSCULAR | Status: DC | PRN
  Administered 2022-03-04: 150 ug via INTRAVENOUS

## 2022-03-04 MED ORDER — LORATADINE 10 MG PO TABS: 10.0000 mg | ORAL_TABLET | Freq: Every evening | ORAL | Status: DC

## 2022-03-04 MED ORDER — DEXAMETHASONE SODIUM PHOSPHATE 10 MG/ML IJ SOLN
INTRAMUSCULAR | Status: AC
  Filled 2022-03-04: qty 1

## 2022-03-04 MED ORDER — OXYCODONE HCL 5 MG PO TABS
5.0000 mg | ORAL_TABLET | Freq: Once | ORAL | Status: AC | PRN
  Administered 2022-03-04: 5 mg via ORAL

## 2022-03-04 MED ORDER — LIDOCAINE-EPINEPHRINE 1 %-1:100000 IJ SOLN
INTRAMUSCULAR | Status: AC
  Filled 2022-03-04: qty 1

## 2022-03-04 MED ORDER — NAPROXEN 250 MG PO TABS: 500.0000 mg | ORAL_TABLET | Freq: Two times a day (BID) | ORAL | Status: DC | PRN

## 2022-03-04 MED ORDER — PROPOFOL 10 MG/ML IV BOLUS
INTRAVENOUS | Status: DC | PRN
  Administered 2022-03-04: 130 mg via INTRAVENOUS

## 2022-03-04 MED ORDER — ALBUTEROL SULFATE (2.5 MG/3ML) 0.083% IN NEBU: 2.5000 mg | INHALATION_SOLUTION | Freq: Four times a day (QID) | RESPIRATORY_TRACT | Status: DC | PRN

## 2022-03-04 MED ORDER — ATENOLOL 25 MG PO TABS: 25.0000 mg | ORAL_TABLET | Freq: Two times a day (BID) | ORAL | Status: DC

## 2022-03-04 MED ORDER — CEFAZOLIN SODIUM 1 G IJ SOLR
INTRAMUSCULAR | Status: AC
  Filled 2022-03-04: qty 20

## 2022-03-04 MED ORDER — SUMATRIPTAN SUCCINATE 50 MG PO TABS: 75.0000 mg | ORAL_TABLET | Freq: Two times a day (BID) | ORAL | Status: DC | PRN

## 2022-03-04 MED ORDER — SEMAGLUTIDE 7 MG PO TABS: 7.0000 mg | ORAL_TABLET | Freq: Every day | ORAL | Status: DC

## 2022-03-04 MED ORDER — OXYCODONE HCL 5 MG PO TABS
ORAL_TABLET | ORAL | Status: AC
  Filled 2022-03-04: qty 1

## 2022-03-04 MED ORDER — ROSUVASTATIN CALCIUM 20 MG PO TABS
20.0000 mg | ORAL_TABLET | Freq: Every evening | ORAL | Status: DC
  Administered 2022-03-04: 20 mg via ORAL
  Filled 2022-03-04: qty 1

## 2022-03-04 MED ORDER — LIDOCAINE 2% (20 MG/ML) 5 ML SYRINGE
INTRAMUSCULAR | Status: DC | PRN
  Administered 2022-03-04: 40 mg via INTRAVENOUS

## 2022-03-04 MED ORDER — OXYMETAZOLINE HCL 0.05 % NA SOLN
NASAL | Status: AC
  Filled 2022-03-04: qty 30

## 2022-03-04 MED ORDER — LIDOCAINE-EPINEPHRINE 1 %-1:100000 IJ SOLN
INTRAMUSCULAR | Status: DC | PRN
  Administered 2022-03-04: 4 mL via INTRADERMAL

## 2022-03-04 MED ORDER — FUROSEMIDE 40 MG PO TABS
40.0000 mg | ORAL_TABLET | Freq: Every day | ORAL | Status: DC
  Filled 2022-03-04: qty 1

## 2022-03-04 MED ORDER — SALINE SPRAY 0.65 % NA SOLN
2.0000 | NASAL | Status: DC
  Administered 2022-03-04 – 2022-03-05 (×5): 2 via NASAL
  Filled 2022-03-04: qty 44

## 2022-03-04 MED ORDER — ACETAMINOPHEN 325 MG PO TABS: 650.0000 mg | ORAL_TABLET | Freq: Four times a day (QID) | ORAL | Status: DC | PRN

## 2022-03-04 MED ORDER — SUGAMMADEX SODIUM 500 MG/5ML IV SOLN
INTRAVENOUS | Status: AC
  Filled 2022-03-04: qty 5

## 2022-03-04 MED ORDER — SUGAMMADEX SODIUM 500 MG/5ML IV SOLN
INTRAVENOUS | Status: DC | PRN
  Administered 2022-03-04: 100 mg via INTRAVENOUS
  Administered 2022-03-04: 400 mg via INTRAVENOUS

## 2022-03-04 MED ORDER — CEFAZOLIN SODIUM-DEXTROSE 2-3 GM-%(50ML) IV SOLR
INTRAVENOUS | Status: DC | PRN
  Administered 2022-03-04: 2 g via INTRAVENOUS

## 2022-03-04 MED ORDER — IBUPROFEN 600 MG PO TABS: 600.0000 mg | ORAL_TABLET | Freq: Four times a day (QID) | ORAL | Status: DC | PRN

## 2022-03-04 MED ORDER — OXYCODONE HCL 5 MG/5ML PO SOLN: 5.0000 mg | Freq: Once | ORAL | Status: AC | PRN

## 2022-03-04 MED ORDER — MIDAZOLAM HCL 2 MG/2ML IJ SOLN
INTRAMUSCULAR | Status: AC
  Filled 2022-03-04: qty 2

## 2022-03-04 SURGICAL SUPPLY — 27 items
BAG COUNTER SPONGE SURGICOUNT (BAG) ×2 IMPLANT
BAG SPNG CNTER NS LX DISP (BAG) ×1
BLADE INF TURB ROT M4 2 5PK (BLADE) ×2 IMPLANT
BLADE SURG 15 STRL LF DISP TIS (BLADE) IMPLANT
BLADE SURG 15 STRL SS (BLADE) ×2
CANISTER SUCT 3000ML PPV (MISCELLANEOUS) ×4 IMPLANT
COAGULATOR SUCT SWTCH 10FR 6 (ELECTROSURGICAL) IMPLANT
DRAPE HALF SHEET 40X57 (DRAPES) ×1 IMPLANT
DRSG TELFA 3X8 NADH (GAUZE/BANDAGES/DRESSINGS) IMPLANT
ELECT REM PT RETURN 9FT ADLT (ELECTROSURGICAL) ×2
ELECTRODE REM PT RTRN 9FT ADLT (ELECTROSURGICAL) ×1 IMPLANT
GAUZE 4X4 16PLY ~~LOC~~+RFID DBL (SPONGE) ×1 IMPLANT
GLOVE BIO SURGEON STRL SZ7.5 (GLOVE) ×4 IMPLANT
GOWN STRL REUS W/ TWL LRG LVL3 (GOWN DISPOSABLE) ×2 IMPLANT
GOWN STRL REUS W/TWL LRG LVL3 (GOWN DISPOSABLE) ×4
KIT BASIN OR (CUSTOM PROCEDURE TRAY) ×2 IMPLANT
KIT TURNOVER KIT B (KITS) ×2 IMPLANT
NDL HYPO 25GX1X1/2 BEV (NEEDLE) IMPLANT
NEEDLE HYPO 25GX1X1/2 BEV (NEEDLE) ×2 IMPLANT
NS IRRIG 1000ML POUR BTL (IV SOLUTION) ×2 IMPLANT
PAD ARMBOARD 7.5X6 YLW CONV (MISCELLANEOUS) ×4 IMPLANT
PAD DRESSING TELFA 3X8 NADH (GAUZE/BANDAGES/DRESSINGS) IMPLANT
PATTIES SURGICAL .5 X3 (DISPOSABLE) ×2 IMPLANT
POSITIONER HEAD DONUT 9IN (MISCELLANEOUS) ×1 IMPLANT
TOWEL GREEN STERILE FF (TOWEL DISPOSABLE) ×2 IMPLANT
TRAY ENT MC OR (CUSTOM PROCEDURE TRAY) ×2 IMPLANT
WATER STERILE IRR 1000ML POUR (IV SOLUTION) ×2 IMPLANT

## 2022-03-04 NOTE — Anesthesia Procedure Notes (Signed)
Procedure Name: Intubation Date/Time: 03/04/2022 12:19 PM Performed by: Harden Mo, CRNA Pre-anesthesia Checklist: Patient identified, Emergency Drugs available, Suction available and Patient being monitored Patient Re-evaluated:Patient Re-evaluated prior to induction Oxygen Delivery Method: Circle System Utilized Preoxygenation: Pre-oxygenation with 100% oxygen Induction Type: IV induction Ventilation: Mask ventilation without difficulty Laryngoscope Size: Miller and 2 Grade View: Grade II Tube type: Oral Tube size: 7.0 mm Number of attempts: 1 Airway Equipment and Method: Stylet and Oral airway Placement Confirmation: ETT inserted through vocal cords under direct vision, positive ETCO2 and breath sounds checked- equal and bilateral Secured at: 22 cm Tube secured with: Tape Dental Injury: Teeth and Oropharynx as per pre-operative assessment

## 2022-03-04 NOTE — Anesthesia Preprocedure Evaluation (Signed)
Anesthesia Evaluation  Patient identified by MRN, date of birth, ID band Patient awake    Reviewed: Allergy & Precautions, NPO status , Patient's Chart, lab work & pertinent test results  History of Anesthesia Complications Negative for: history of anesthetic complications  Airway Mallampati: II  TM Distance: >3 FB Neck ROM: Full    Dental  (+) Teeth Intact, Dental Advisory Given, Chipped,    Pulmonary sleep apnea ,    breath sounds clear to auscultation       Cardiovascular hypertension, Pt. on medications and Pt. on home beta blockers (-) angina(-) Past MI and (-) CHF  Rhythm:Regular  Left ventricle: The cavity size was normal. Wall thickness was  normal. Systolic function was normal. The estimated ejection  fraction was in the range of 55% to 60%. Wall motion was normal;  there were no regional wall motion abnormalities. Features are  consistent with a pseudonormal left ventricular filling pattern,  with concomitant abnormal relaxation and increased filling  pressure (grade 2 diastolic dysfunction).   a. 09/2016: NST showed a small defect of mild severity present in the basal inferoseptal and mid inferoseptal location, consistent with ischemia. --> medically managed   Neuro/Psych  Headaches, PSYCHIATRIC DISORDERS Anxiety  Neuromuscular disease    GI/Hepatic GERD  Medicated,  Endo/Other  diabetes  Renal/GU Lab Results      Component                Value               Date                      CREATININE               0.81                03/04/2022                Musculoskeletal  (+) Arthritis ,   Abdominal   Peds  Hematology negative hematology ROS (+)   Anesthesia Other Findings   Reproductive/Obstetrics                             Anesthesia Physical Anesthesia Plan  ASA: 2  Anesthesia Plan: General   Post-op Pain Management:    Induction: Intravenous  PONV  Risk Score and Plan: 3 and Ondansetron and Dexamethasone  Airway Management Planned: Oral ETT  Additional Equipment: None  Intra-op Plan:   Post-operative Plan: Extubation in OR  Informed Consent: I have reviewed the patients History and Physical, chart, labs and discussed the procedure including the risks, benefits and alternatives for the proposed anesthesia with the patient or authorized representative who has indicated his/her understanding and acceptance.     Dental advisory given  Plan Discussed with: CRNA  Anesthesia Plan Comments:         Anesthesia Quick Evaluation

## 2022-03-04 NOTE — Brief Op Note (Signed)
03/04/2022  12:48 PM  PATIENT:  Erica [name]  66 y.o. female  PRE-OPERATIVE DIAGNOSIS:  Chronic sinusitis, unspecified location  POST-OPERATIVE DIAGNOSIS:  Chronic sinusitis, unspecified location  PROCEDURE:  Procedure(s): TURBINATE REDUCTION (Bilateral)  SURGEON:  Surgeon(s) and Role:    Melida Quitter, MD - Primary  PHYSICIAN ASSISTANT:   ASSISTANTS: none   ANESTHESIA:   general  EBL:  Minimal   BLOOD ADMINISTERED:none  DRAINS: none   LOCAL MEDICATIONS USED:  LIDOCAINE   SPECIMEN:  No Specimen  DISPOSITION OF SPECIMEN:  N/A  COUNTS:  YES  TOURNIQUET:  * No tourniquets in log *  DICTATION: .Note written in EPIC  PLAN OF CARE: Admit for overnight observation  PATIENT DISPOSITION:  PACU - hemodynamically stable.   Delay start of Pharmacological VTE agent (>24hrs) due to surgical blood loss or risk of bleeding: yes

## 2022-03-04 NOTE — Transfer of Care (Signed)
Immediate Anesthesia Transfer of Care Note  Patient: Erica [name]  Procedure(s) Performed: Albin Felling REDUCTION (Bilateral)  Patient Location: PACU  Anesthesia Type:General  Level of Consciousness: awake and drowsy  Airway & Oxygen Therapy: Patient Spontanous Breathing  Post-op Assessment: Report given to RN, Post -op Vital signs reviewed and stable and Patient moving all extremities X 4  Post vital signs: Reviewed and stable  Last Vitals:  Vitals Value Taken Time  BP 135/80 03/04/22 1302  Temp 36.6 C 03/04/22 1300  Pulse 76 03/04/22 1303  Resp 14 03/04/22 1303  SpO2 95 % 03/04/22 1303  Vitals shown include unvalidated device data.  Last Pain:  Vitals:   03/04/22 1300  TempSrc:   PainSc: Asleep      Patients Stated Pain Goal: 2 (40/76/80 8811)  Complications: No notable events documented.

## 2022-03-04 NOTE — Op Note (Signed)
PREOPERATIVE DIAGNOSIS:  Turbinate hypertrophy   POSTOPERATIVE DIAGNOSIS:  Turbinate hypertrophy   PROCEDURE:  Bilateral turbinate reduction   SURGEON:  Melida Quitter, MD   ANESTHESIA:  General endotracheal anesthesia   COMPLICATIONS:  None   INDICATIONS:  The patient is a 66 year old female with a history of nasal obstruction due to turbinate hypertrophy not responding to medical therapy.  She presents to the operating room for surgical management.   FINDINGS:  Enlarged turbinates   DESCRIPTION OF PROCEDURE:  The patient was identified in the holding room, informed consent having been obtained including discussion of risks, benefits and alternatives, the patient was brought to the operative suite and put the operative table in  supine position.  Anesthesia was induced and the patient was intubated by the anesthesia team without difficulty.  The eyes were taped closed.  The patient was given intravenous antibiotics during the case.  The nose was packed in both sides with Afrin-soaked pledgets for several minutes that were then removed.  The inferior turbinates were injected with local anesthetic.  An incision was made at the anterior head of each inferior turbinate and soft tissues were elevated off of the underlying bone using a freer elevator.  The microdebrider was then used with the turbinate blade to remove submucosal tissue keeping the overlying mucosa and underlying bone intact.  Soft tissues were then redraped and the turbinates out-fractured.  The nasal passage was suctioned.  Pledgets were placed back in the nose for wake-up.  Drapes were removed and the patient was cleaned off.  The patient was returned to anesthesia for wakeup, extubated, and taken to the recovery room in stable condition.

## 2022-03-04 NOTE — H&P (Signed)
Erica [name] is an 66 y.o. female.   Chief Complaint: Nasal obstruction HPI: 66 year old female with untreated obstructive sleep apnea and nasal obstruction due to turbinate hypertrophy.  Past Medical History:  Diagnosis Date   Abnormal stress test    a. 09/2016: NST showed a small defect of mild severity present in the basal inferoseptal and mid inferoseptal location, consistent with ischemia. --> medically managed   ALLERGIC RHINITIS    Anxiety    Bursitis    DDD (degenerative disc disease), lumbar    ESI with Ramos (spring 2016)   Depression    Diabetes mellitus without complication (HCC)    Type II   Dyslipidemia    External hemorrhoids    GERD (gastroesophageal reflux disease)    Hypertension    Obesity    Osteoarthritis    Palpitations    a. prior event monitor showing sinus tachycardia, no PAF.    Panic attacks    Hx of depression   Sleep apnea    No Cpap    Past Surgical History:  Procedure Laterality Date   ABDOMINAL HYSTERECTOMY     DILATION AND CURETTAGE OF UTERUS     MASS EXCISION Left 12/30/2015   Procedure: EXCISION LEFT LEG MASS;  Surgeon: Donnie Mesa, MD;  Location: [facility];  Service: General;  Laterality: Left;   REPLACEMENT TOTAL KNEE Right    RIGHT OOPHORECTOMY     No cancer    Family History  Problem Relation Age of Onset   Heart disease Mother        Enlarged Heart, Pacemaker   Diabetes Mother    Thyroid disease Mother    Hyperlipidemia Mother    Hypertension Mother    Sleep apnea Mother    Dementia Father    Kidney failure Father    Prostate cancer Father    Alcoholism Father    Asthma Other    Allergies Sister    Asthma Son    Breast cancer Maternal Aunt        cousin   Colon cancer Cousin    Heart disease Son        CHF, morbid obesity   Prostate cancer Maternal Uncle    Diabetes Son    Social History:  reports that she has never smoked. She has never used smokeless tobacco. She reports current alcohol  use of about 1.0 standard drink per week. She reports that she does not use drugs.  Allergies:  Allergies  Allergen Reactions   Seroquel [Quetiapine Fumarate] Other (See Comments)    Hallucinations, palpitations   Codeine Nausea And Vomiting   Guaifenesin-Codeine Other (See Comments)     feels spacey   Wellbutrin [Bupropion] Palpitations    hallucinations    Medications Prior to Admission  Medication Sig Dispense Refill   ALPRAZolam (XANAX) 0.5 MG tablet Take 0.5 mg by mouth in the morning and at bedtime.     atenolol (TENORMIN) 25 MG tablet TAKE ONE TABLET BY MOUTH IN THE MORNING AND TAKE TWO TABLETS IN THE EVENING 90 tablet 2   cyanocobalamin (,VITAMIN B-12,) 1000 MCG/ML injection Inject 1,000 mcg into the muscle every 30 (thirty) days.     dapagliflozin propanediol (FARXIGA) 10 MG TABS tablet Take 1 tablet (10 mg total) by mouth daily before breakfast. 90 tablet 1   fluticasone (FLONASE) 50 MCG/ACT nasal spray Place 2 sprays into both nostrils daily. (Patient taking differently: Place 2 sprays into both nostrils in the morning and  at bedtime.) 16 g 11   furosemide (LASIX) 40 MG tablet TAKE 1 TABLET BY MOUTH EVERY DAY 90 tablet 1   hydrOXYzine (ATARAX) 25 MG tablet Take 25 mg by mouth 3 (three) times daily as needed for itching (allergies).     levocetirizine (XYZAL) 5 MG tablet Take 1 tablet (5 mg total) by mouth every evening. 90 tablet 2   linaclotide (LINZESS) 290 MCG CAPS capsule Take 1 capsule (290 mcg total) by mouth daily before breakfast. (Patient taking differently: Take 290 mcg by mouth daily as needed (constipation.).) 90 capsule 2   Multiple Vitamins-Minerals (MULTIVITAMIN WITH MINERALS) tablet Take 1 tablet by mouth daily.     omeprazole (PRILOSEC) 20 MG capsule Take 1 capsule (20 mg total) by mouth 2 (two) times daily before a meal. (Patient taking differently: Take 20 mg by mouth every evening.) 180 capsule 2   potassium chloride SA (KLOR-CON) 20 MEQ tablet Take 1 tablet  (20 mEq total) by mouth daily. 1 tablet by mouth daily (Patient taking differently: Take 20 mEq by mouth every evening.) 90 tablet 3   rosuvastatin (CRESTOR) 20 MG tablet Take 1 tablet (20 mg total) by mouth daily. (Patient taking differently: Take 20 mg by mouth every evening.) 90 tablet 3   Semaglutide (RYBELSUS) 7 MG TABS Take 7 mg by mouth daily. 90 tablet 1   Tart Cherry (TART CHERRY ULTRA) 1200 MG CAPS Take 1 capsule by mouth every evening.     venlafaxine XR (EFFEXOR-XR) 150 MG 24 hr capsule Take 1 capsule (150 mg total) by mouth 2 (two) times daily. 180 capsule 2   Vitamin D, Ergocalciferol, (DRISDOL) 1.25 MG (50000 UNIT) CAPS capsule TAKE 1 CAPSULE (50,000 UNITS TOTAL) BY MOUTH EVERY 7 (SEVEN) DAYS 12 capsule 0   albuterol (VENTOLIN HFA) 108 (90 Base) MCG/ACT inhaler Inhale 2 puffs into the lungs every 6 (six) hours as needed for wheezing or shortness of breath. (Patient not taking: Reported on 03/03/2022) 16 g 3   AMBULATORY NON FORMULARY MEDICATION 4 Prong cane 1 Units 0   blood glucose meter kit and supplies KIT Dispense based on patient and insurance preference. Use up to four times daily as directed. (FOR E11.9). 1 each 0   propranolol (INDERAL) 10 MG tablet Take 1 tablet (10 mg total) by mouth 3 (three) times daily as needed (PALPITATIONS). 60 tablet 3   SUMAtriptan-naproxen (TREXIMET) 85-500 MG tablet Take 1 pill as needed for migraine, if migraine persists may repeat x1 after 2 hours 10 tablet 8   tiZANidine (ZANAFLEX) 4 MG tablet Take 1 tablet (4 mg total) by mouth at bedtime. (Patient taking differently: Take 4 mg by mouth daily as needed (spasms).) 30 tablet 0   TURMERIC PO Take 1 capsule by mouth daily. Vita b-12  & b-6      Results for orders placed or performed during the hospital encounter of 03/04/22 (from the past 48 hour(s))  Glucose, capillary     Status: Abnormal   Collection Time: 03/04/22  9:51 AM  Result Value Ref Range   Glucose-Capillary 127 (H) 70 - 99 mg/dL     Comment: Glucose reference range applies only to samples taken after fasting for at least 8 hours.  No blood products     Status: None   Collection Time: 03/04/22 10:00 AM  Result Value Ref Range   Transfuse no blood products      TRANSFUSE NO BLOOD PRODUCTS, VERIFIED BY Barbie Haggis, RN Performed at [facility],  [address] [place], [place] [address]   Basic metabolic panel per protocol     Status: Abnormal   Collection Time: 03/04/22 10:20 AM  Result Value Ref Range   Sodium 139 135 - 145 mmol/L   Potassium 3.4 (L) 3.5 - 5.1 mmol/L   Chloride 106 98 - 111 mmol/L   CO2 25 22 - 32 mmol/L   Glucose, Bld 115 (H) 70 - 99 mg/dL    Comment: Glucose reference range applies only to samples taken after fasting for at least 8 hours.   BUN 12 8 - 23 mg/dL   Creatinine, Ser 0.81 0.44 - 1.00 mg/dL   Calcium 9.4 8.9 - 10.3 mg/dL   GFR, Estimated >60 >60 mL/min    Comment: (NOTE) Calculated using the CKD-EPI Creatinine Equation (2021)    Anion gap 8 5 - 15    Comment: Performed at [facility] [address] [place], [place] [address]  CBC per protocol     Status: None   Collection Time: 03/04/22 10:20 AM  Result Value Ref Range   WBC 6.2 4.0 - 10.5 K/uL   RBC 4.51 3.87 - 5.11 MIL/uL   Hemoglobin 12.9 12.0 - 15.0 g/dL   HCT 39.3 36.0 - 46.0 %   MCV 87.1 80.0 - 100.0 fL   MCH 28.6 26.0 - 34.0 pg   MCHC 32.8 30.0 - 36.0 g/dL   RDW 14.0 11.5 - 15.5 %   Platelets 267 150 - 400 K/uL   nRBC 0.0 0.0 - 0.2 %    Comment: Performed at [facility], [place] [address] [place], [place] [address]   No results found.  Review of Systems  All other systems reviewed and are negative.  Blood pressure (!) 143/88, pulse 80, temperature 98.6 F (37 C), temperature source Oral, resp. rate 18, height 5' 6"  (1.676 m), weight 105.7 kg, SpO2 97 %. Physical Exam Constitutional:      Appearance: Normal appearance. She is normal weight.  HENT:     Head: Normocephalic and atraumatic.      Right Ear: External ear normal.     Left Ear: External ear normal.     Nose: Nose normal.     Mouth/Throat:     Mouth: Mucous membranes are moist.     Pharynx: Oropharynx is clear.  Eyes:     Extraocular Movements: Extraocular movements intact.     Conjunctiva/sclera: Conjunctivae normal.     Pupils: Pupils are equal, round, and reactive to light.  Cardiovascular:     Rate and Rhythm: Normal rate.  Pulmonary:     Effort: Pulmonary effort is normal.  Musculoskeletal:     Cervical back: Normal range of motion.  Skin:    General: Skin is warm and dry.  Neurological:     General: No focal deficit present.     Mental Status: She is alert and oriented to person, place, and time.  Psychiatric:        Mood and Affect: Mood normal.        Behavior: Behavior normal.        Thought Content: Thought content normal.        Judgment: Judgment normal.     Assessment/Plan Turbinate hypertrophy  To OR for bilateral turbinate reduction.  Plan overnight observation.  Melida Quitter, MD 03/04/2022, 11:58 AM

## 2022-03-05 ENCOUNTER — Encounter (HOSPITAL_COMMUNITY): Payer: Self-pay | Admitting: Otolaryngology

## 2022-03-05 DIAGNOSIS — J343 Hypertrophy of nasal turbinates: Secondary | ICD-10-CM | POA: Diagnosis not present

## 2022-03-05 DIAGNOSIS — I10 Essential (primary) hypertension: Secondary | ICD-10-CM | POA: Diagnosis not present

## 2022-03-05 DIAGNOSIS — Z79899 Other long term (current) drug therapy: Secondary | ICD-10-CM | POA: Diagnosis not present

## 2022-03-05 DIAGNOSIS — E119 Type 2 diabetes mellitus without complications: Secondary | ICD-10-CM | POA: Diagnosis not present

## 2022-03-05 DIAGNOSIS — J329 Chronic sinusitis, unspecified: Secondary | ICD-10-CM | POA: Diagnosis not present

## 2022-03-05 DIAGNOSIS — Z96651 Presence of right artificial knee joint: Secondary | ICD-10-CM | POA: Diagnosis not present

## 2022-03-05 LAB — GLUCOSE, CAPILLARY: Glucose-Capillary: 139 mg/dL — ABNORMAL HIGH (ref 70–99)

## 2022-03-05 NOTE — Discharge Summary (Signed)
Physician Discharge Summary  Patient ID: Erica [name] MRN: [id number] DOB/AGE: 1956/07/03 66 y.o.  Admit date: 03/04/2022 Discharge date: 03/05/2022  Admission Diagnoses: Turbinate hypertrophy, sleep apnea  Discharge Diagnoses:  Principal Problem:   Nasal turbinate hypertrophy   Discharged Condition: good  Hospital Course: 66 year old female with untreated obstructive sleep apnea and nasal obstruction due to turbinate hypertrophy not responded to medical therapy presented for surgical management.  See operative note.  She was observed overnight and did well.  She is felt stable for discharge on POD 1.  Consults: None  Significant Diagnostic Studies: None  Treatments: surgery: Bilateral turbinate reduction  Discharge Exam: Blood pressure 130/78, pulse 62, temperature 97.7 F (36.5 C), temperature source Oral, resp. rate 17, height '5\' 6"'  (1.676 m), weight 105.7 kg, SpO2 100 %. General appearance: alert, cooperative, and no distress Nose: no significant bleeding  Disposition: Discharge disposition: 01-Home or Self Care       Discharge Instructions     Diet - low sodium heart healthy   Complete by: As directed    Discharge instructions   Complete by: As directed    Avoid strenuous activity or nose blowing.  Use saline spray in both sides every 2-4 hours.  Change drip pad as needed until bleeding stops.  Tylenol and/or Motrin for pain.   Increase activity slowly   Complete by: As directed       Allergies as of 03/05/2022       Reactions   Seroquel [quetiapine Fumarate] Other (See Comments)   Hallucinations, palpitations   Codeine Nausea And Vomiting   Guaifenesin-codeine Other (See Comments)    feels spacey   Wellbutrin [bupropion] Palpitations   hallucinations        Medication List     TAKE these medications    albuterol 108 (90 Base) MCG/ACT inhaler Commonly known as: VENTOLIN HFA Inhale 2 puffs into the lungs every 6 (six) hours as needed for  wheezing or shortness of breath.   ALPRAZolam 0.5 MG tablet Commonly known as: XANAX Take 0.5 mg by mouth in the morning and at bedtime.   AMBULATORY NON FORMULARY MEDICATION 4 Prong cane   atenolol 25 MG tablet Commonly known as: TENORMIN TAKE ONE TABLET BY MOUTH IN THE MORNING AND TAKE TWO TABLETS IN THE EVENING   blood glucose meter kit and supplies Kit Dispense based on patient and insurance preference. Use up to four times daily as directed. (FOR E11.9).   cyanocobalamin 1000 MCG/ML injection Commonly known as: (VITAMIN B-12) Inject 1,000 mcg into the muscle every 30 (thirty) days.   dapagliflozin propanediol 10 MG Tabs tablet Commonly known as: Farxiga Take 1 tablet (10 mg total) by mouth daily before breakfast.   fluticasone 50 MCG/ACT nasal spray Commonly known as: FLONASE Place 2 sprays into both nostrils daily. What changed: when to take this   furosemide 40 MG tablet Commonly known as: LASIX TAKE 1 TABLET BY MOUTH EVERY DAY   hydrOXYzine 25 MG tablet Commonly known as: ATARAX Take 25 mg by mouth 3 (three) times daily as needed for itching (allergies).   levocetirizine 5 MG tablet Commonly known as: Xyzal Take 1 tablet (5 mg total) by mouth every evening.   linaclotide 290 MCG Caps capsule Commonly known as: LINZESS Take 1 capsule (290 mcg total) by mouth daily before breakfast. What changed:  when to take this reasons to take this   multivitamin with minerals tablet Take 1 tablet by mouth daily.   omeprazole 20  MG capsule Commonly known as: PRILOSEC Take 1 capsule (20 mg total) by mouth 2 (two) times daily before a meal. What changed: when to take this   potassium chloride SA 20 MEQ tablet Commonly known as: KLOR-CON M Take 1 tablet (20 mEq total) by mouth daily. 1 tablet by mouth daily What changed:  when to take this additional instructions   propranolol 10 MG tablet Commonly known as: INDERAL Take 1 tablet (10 mg total) by mouth 3 (three)  times daily as needed (PALPITATIONS).   rosuvastatin 20 MG tablet Commonly known as: CRESTOR Take 1 tablet (20 mg total) by mouth daily. What changed: when to take this   Rybelsus 7 MG Tabs Generic drug: Semaglutide Take 7 mg by mouth daily.   SUMAtriptan-naproxen 85-500 MG tablet Commonly known as: TREXIMET Take 1 pill as needed for migraine, if migraine persists may repeat x1 after 2 hours   Tart Cherry Ultra 1200 MG Caps Generic drug: Tart Cherry Take 1 capsule by mouth every evening.   tiZANidine 4 MG tablet Commonly known as: Zanaflex Take 1 tablet (4 mg total) by mouth at bedtime. What changed:  when to take this reasons to take this   TURMERIC PO Take 1 capsule by mouth daily. Vita b-12  & b-6   venlafaxine XR 150 MG 24 hr capsule Commonly known as: EFFEXOR-XR Take 1 capsule (150 mg total) by mouth 2 (two) times daily.   Vitamin D (Ergocalciferol) 1.25 MG (50000 UNIT) Caps capsule Commonly known as: DRISDOL TAKE 1 CAPSULE (50,000 UNITS TOTAL) BY MOUTH EVERY 7 (SEVEN) DAYS        Follow-up Information     Melida Quitter, MD. Schedule an appointment as soon as possible for a visit in 2 week(s).   Specialty: Otolaryngology Contact information: [address] [place] [address] [phone]                 Signed: Melida Quitter 03/05/2022, 7:13 AM

## 2022-03-05 NOTE — Progress Notes (Incomplete)
Erica [name] to be D/C'd  per MD order.  Discussed with the patient and all questions fully answered.  VSS, Skin clean, dry and intact without evidence of skin break down, no evidence of skin tears noted.  IV catheter discontinued intact. Site without signs and symptoms of complications. Dressing and pressure applied.  An After Visit Summary was printed and given to the patient.  D/c education completed with patient/family including follow up instructions, medication list, d/c activities limitations if indicated, with other d/c instructions as indicated by MD - patient able to verbalize understanding, all questions fully answered.   Patient instructed to return to ED, call 911, or call MD for any changes in condition.   Patient to be escorted via [place], and D/C home via private auto.

## 2022-03-05 NOTE — Anesthesia Postprocedure Evaluation (Signed)
Anesthesia Post Note  Patient: SOUNDRA LAMPLEY  Procedure(s) Performed: Al[place] REDUCTION (Bilateral)     Patient location during evaluation: PACU Anesthesia Type: General Level of consciousness: awake and alert Pain management: pain level controlled Vital Signs Assessment: post-procedure vital signs reviewed and stable Respiratory status: spontaneous breathing, nonlabored ventilation, respiratory function stable and patient connected to nasal cannula oxygen Cardiovascular status: blood pressure returned to baseline and stable Postop Assessment: no apparent nausea or vomiting Anesthetic complications: no   No notable events documented.  Last Vitals:  Vitals:   03/05/22 0428 03/05/22 0756  BP: 130/78 129/76  Pulse: 62 69  Resp: 17 18  Temp: 36.5 C 36.6 C  SpO2: 100% 100%    Last Pain:  Vitals:   03/05/22 0830  TempSrc:   PainSc: 0-No pain                 Cailin Gebel

## 2022-03-16 DIAGNOSIS — G4733 Obstructive sleep apnea (adult) (pediatric): Secondary | ICD-10-CM | POA: Diagnosis not present

## 2022-03-18 ENCOUNTER — Ambulatory Visit: Payer: Medicare Other

## 2022-03-19 ENCOUNTER — Ambulatory Visit: Payer: Medicare Other

## 2022-03-22 NOTE — Telephone Encounter (Signed)
NOTE NOT NEEDED ?

## 2022-03-23 ENCOUNTER — Other Ambulatory Visit: Payer: Self-pay | Admitting: Family Medicine

## 2022-03-23 MED ORDER — TIZANIDINE HCL 4 MG PO TABS: 4.0000 mg | ORAL_TABLET | Freq: Every day | ORAL | 0 refills | Status: DC

## 2022-03-23 MED ORDER — VITAMIN D (ERGOCALCIFEROL) 1.25 MG (50000 UNIT) PO CAPS: 50000.0000 [IU] | ORAL_CAPSULE | ORAL | 0 refills | Status: DC

## 2022-03-25 ENCOUNTER — Ambulatory Visit: Payer: Medicare Other

## 2022-03-25 DIAGNOSIS — E119 Type 2 diabetes mellitus without complications: Secondary | ICD-10-CM | POA: Diagnosis not present

## 2022-03-26 ENCOUNTER — Encounter: Payer: Self-pay | Admitting: Pulmonary Disease

## 2022-03-26 ENCOUNTER — Ambulatory Visit (INDEPENDENT_AMBULATORY_CARE_PROVIDER_SITE_OTHER): Payer: Medicare Other | Admitting: Pulmonary Disease

## 2022-03-26 VITALS — BP 112/68 | HR 75 | Temp 98.6°F | Ht 67.0 in | Wt 231.4 lb

## 2022-03-26 DIAGNOSIS — G4733 Obstructive sleep apnea (adult) (pediatric): Secondary | ICD-10-CM | POA: Diagnosis not present

## 2022-03-26 NOTE — Patient Instructions (Signed)
  X HST  Congratulations on weight loss

## 2022-03-26 NOTE — Progress Notes (Signed)
   Subjective:    Patient ID: Erica [name] Alert, female    DOB: October 12, 1955, 66 y.o.   MRN: [id number]  HPI   66 yo never smoker for follow-up of OSA  PMH -osteoarthritis, diabetes, panic attacks   OSA diagnosed in 2011 was poorly compliant, tred CPAP again in 2017 and could not tolerate, obtained oral appliance but follow-up home study showed that OSA was not completely treated. Hence started back on autoCPAP in 04/2017 with nasal pillows    Chief Complaint  Patient presents with   Follow-up    CPAP still not doing well. Stopped for nasal surgery by Dr Redmond Baseman.    She has issues with chronic sinusitis >> cannot tolerate decongestants , underwent turbinate reduction surgery by Dr. Redmond Baseman 02/2022   She was desensitized to a medium F&P Eson2 2 mask, however she had issues with claustrophobia with this full facemask .  We asked her to go back to nasal pillows but she has still been unable to tolerate CPAP  She has lost weight from 259 pounds last year to her current weight of 231 Still complains of fatigue and somnolence in the daytime     Significant tests/ events reviewed     05/2019 CPAP titration >> 13 cm 11/2009  mild , AHI of 10/hr and desat as low as 83% - wt 240 lbs - BMI 39  PSG 09/2013 showed mild OSA- AHI 13/h    HST 01/2016 20/hr.     HST 03/2017 >> (with oral appliance ) >> mild OSa persists   CT head showed meningioma & has cluster headaches   Review of Systems neg for any significant sore throat, dysphagia, itching, sneezing, nasal congestion or excess/ purulent secretions, fever, chills, sweats, unintended wt loss, pleuritic or exertional cp, hempoptysis, orthopnea pnd or change in chronic leg swelling. Also denies presyncope, palpitations, heartburn, abdominal pain, nausea, vomiting, diarrhea or change in bowel or urinary habits, dysuria,hematuria, rash, arthralgias, visual complaints, headache, numbness weakness or ataxia.     Objective:   Physical Exam  Gen.  Pleasant, obese, in no distress ENT - no lesions, no post nasal drip Neck: No JVD, no thyromegaly, no carotid bruits Lungs: no use of accessory muscles, no dullness to percussion, decreased without rales or rhonchi  Cardiovascular: Rhythm regular, heart sounds  normal, no murmurs or gallops, no peripheral edema Musculoskeletal: No deformities, no cyanosis or clubbing , no tremors       Assessment & Plan:

## 2022-03-26 NOTE — Assessment & Plan Note (Signed)
She has been unable to tolerate CPAP mask interface in spite of mask desensitization and multiple attempts. Oral appliance was not adequate. Her BMI is 36 so she is not a candidate currently for hypoglossal nerve stimulator implant but with the weight loss of even as little as 5 to 7 pounds she may be. I congratulated her on her significant weight loss from last year.  We will repeat a home sleep test.  If she only has mild OSA then we may just continue with weight loss as her primary therapy plan

## 2022-04-05 IMAGING — DX DG HIP (WITH OR WITHOUT PELVIS) 2-3V*R*
3 series · 3 of 3 positions shown · non-contrast
Comparison: None.

CLINICAL DATA: Right hip pain

EXAM:
DG HIP (WITH OR WITHOUT PELVIS) 2-3V RIGHT

[pelvis ap]
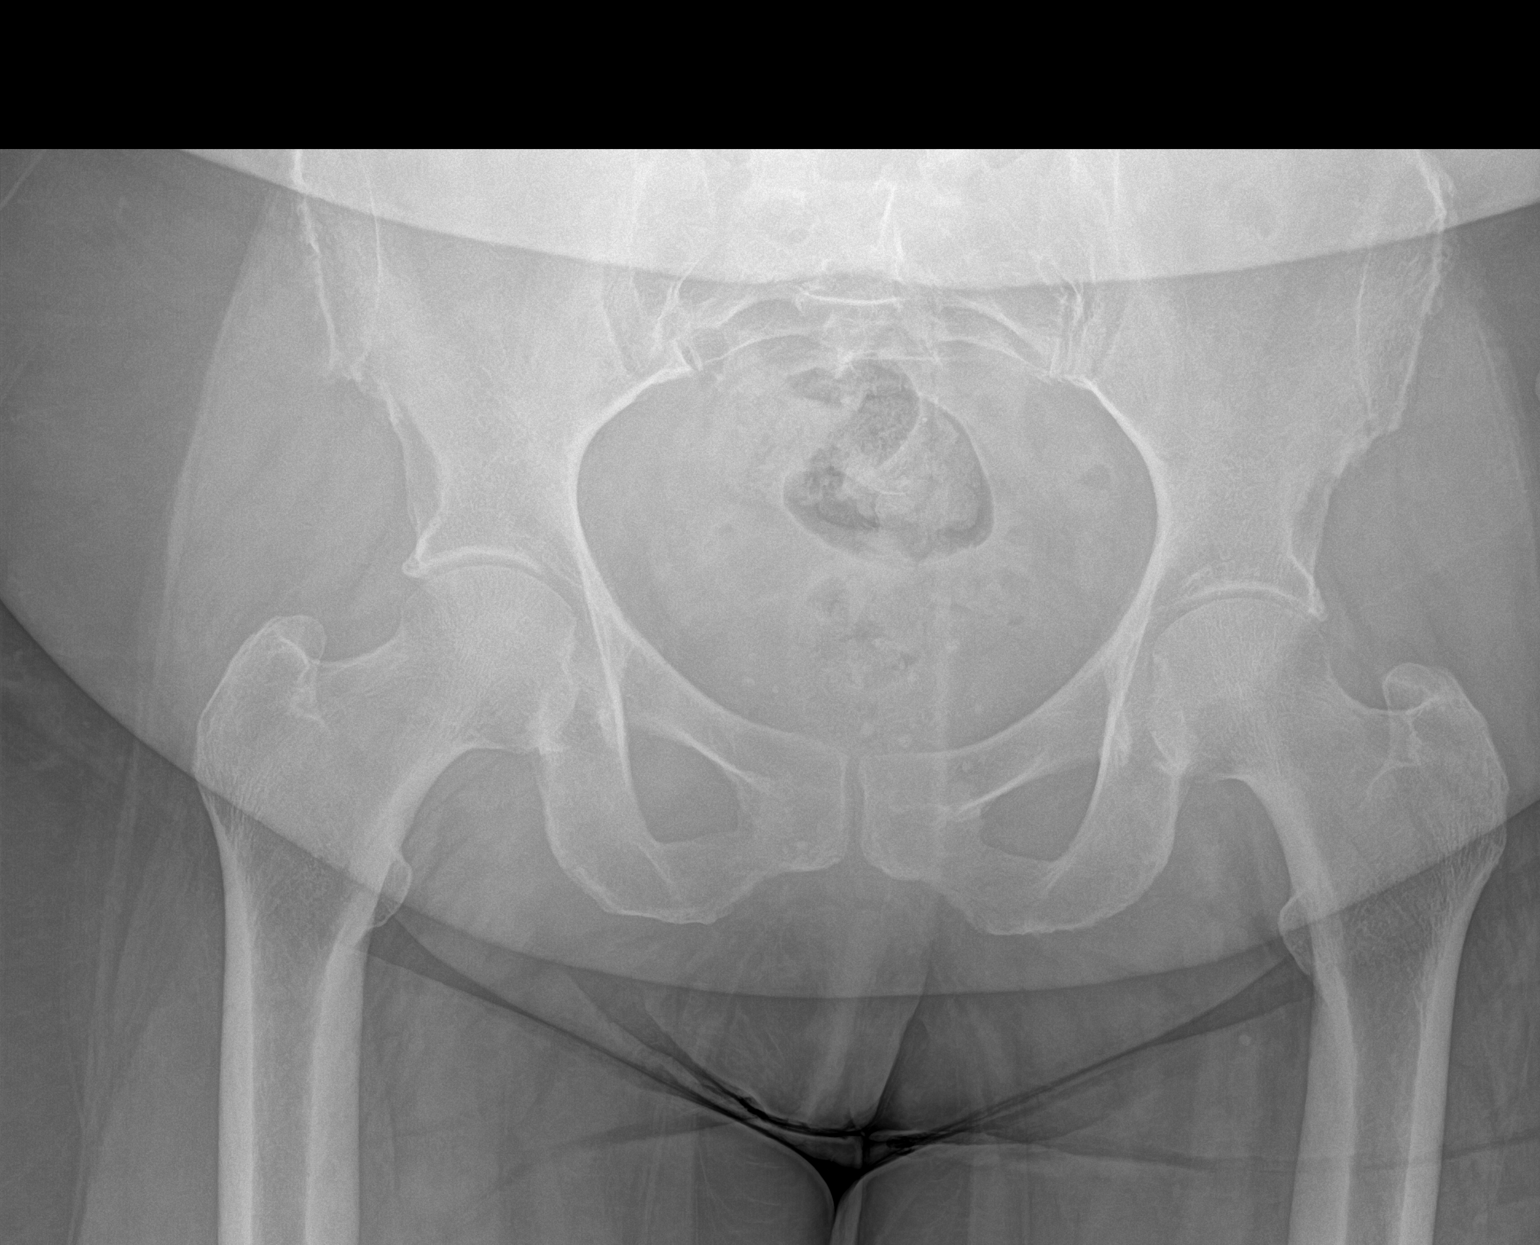

[hip ap]
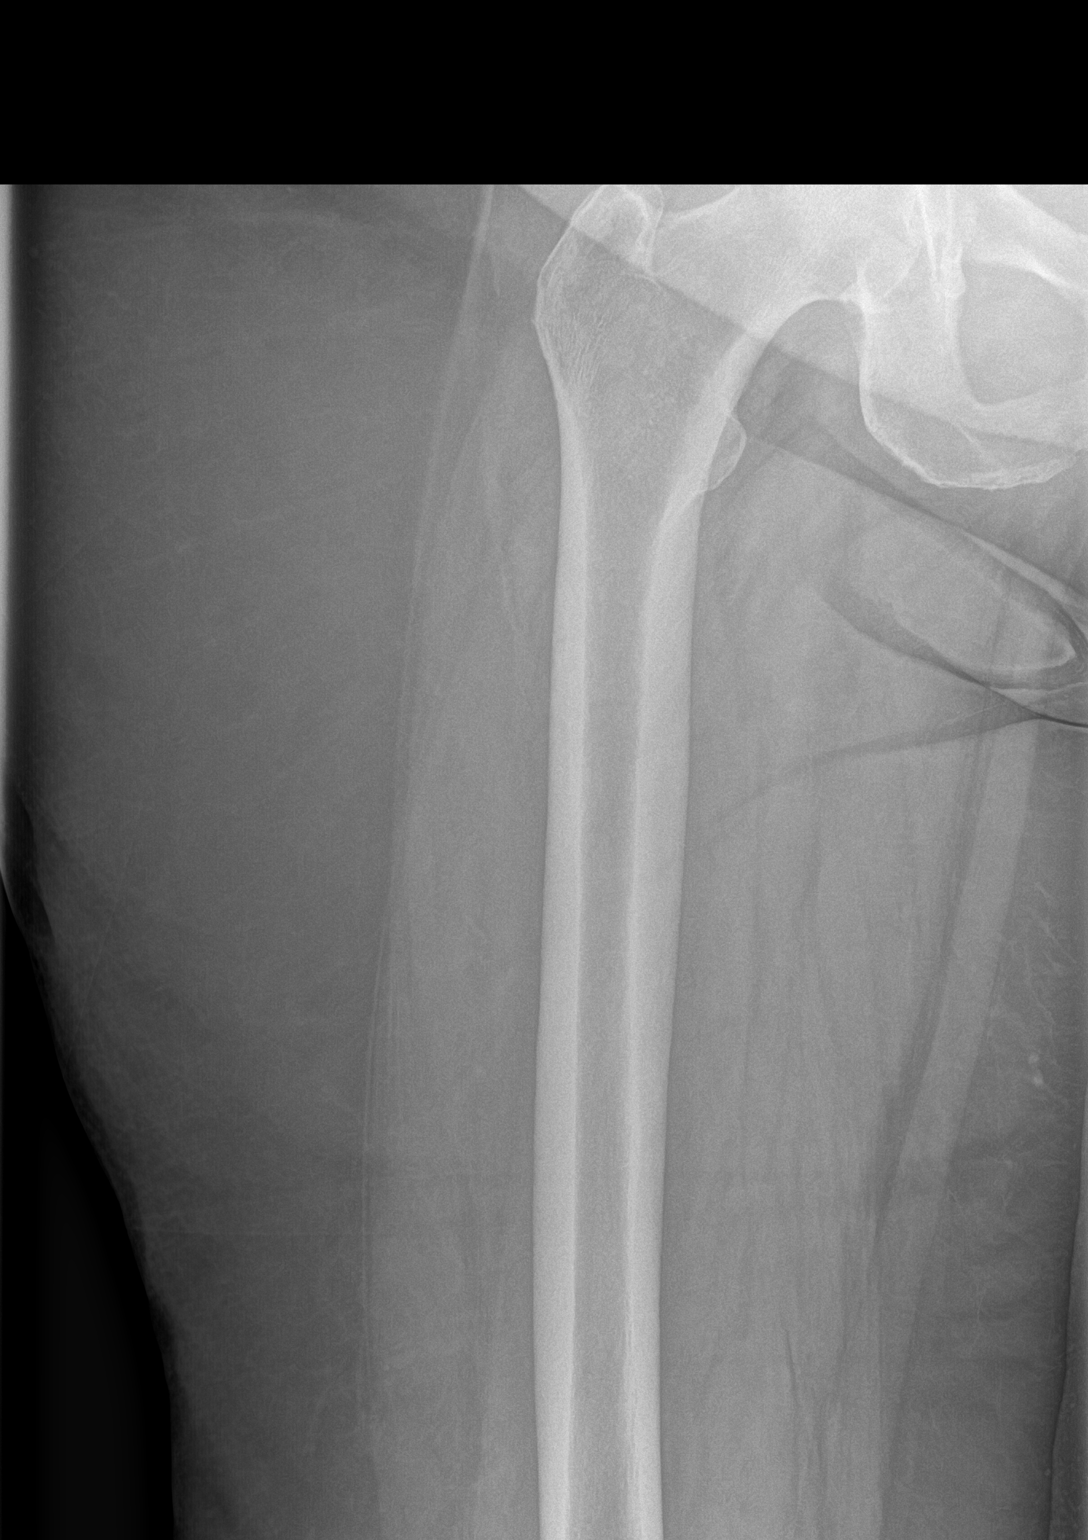

[hip frog leg]
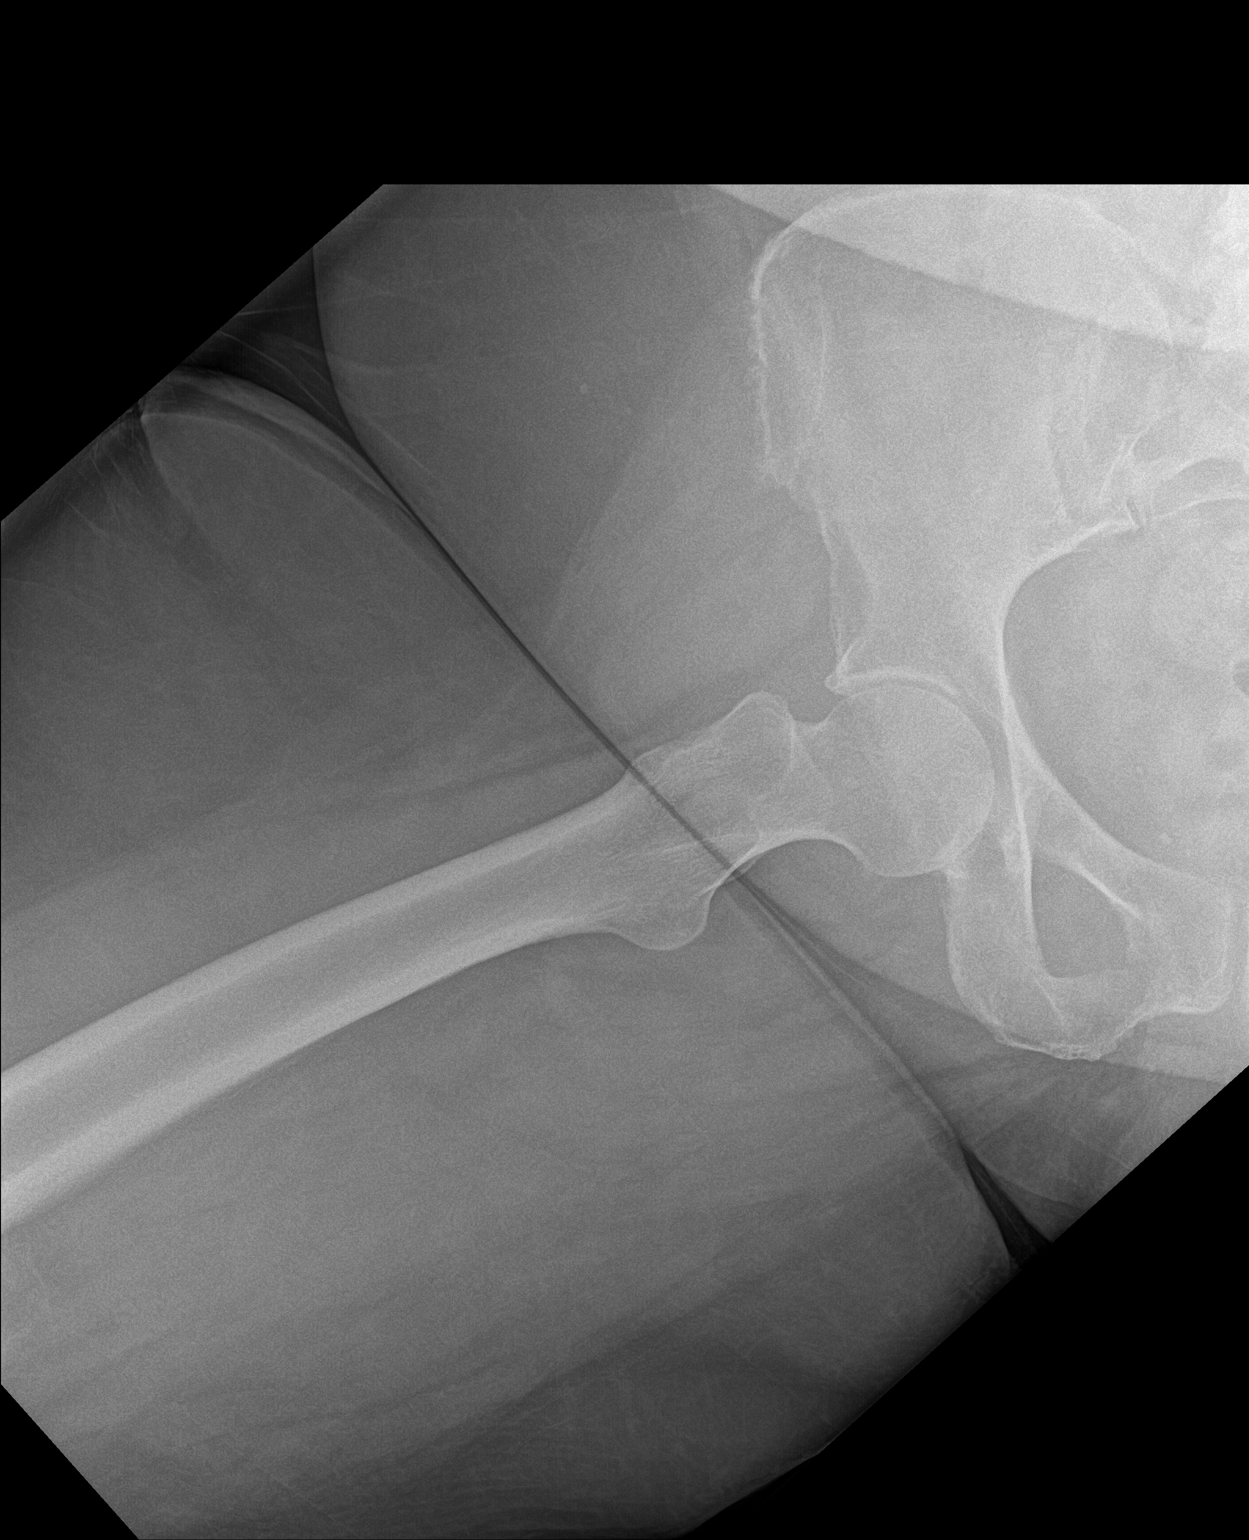

[3 of 3 positions shown; findings below may reference images not displayed]

FINDINGS: There is no evidence of hip fracture or dislocation. There is no
evidence of arthropathy or other focal bone abnormality.
IMPRESSION: Negative.

## 2022-04-15 DIAGNOSIS — G4733 Obstructive sleep apnea (adult) (pediatric): Secondary | ICD-10-CM | POA: Diagnosis not present

## 2022-05-06 ENCOUNTER — Ambulatory Visit (INDEPENDENT_AMBULATORY_CARE_PROVIDER_SITE_OTHER): Payer: Medicare Other | Admitting: Family Medicine

## 2022-05-06 VITALS — BP 118/78 | HR 76 | Ht 67.0 in | Wt 234.0 lb

## 2022-05-06 DIAGNOSIS — M5136 Other intervertebral disc degeneration, lumbar region: Secondary | ICD-10-CM | POA: Diagnosis not present

## 2022-05-06 DIAGNOSIS — E538 Deficiency of other specified B group vitamins: Secondary | ICD-10-CM | POA: Diagnosis not present

## 2022-05-06 DIAGNOSIS — M5432 Sciatica, left side: Secondary | ICD-10-CM | POA: Diagnosis not present

## 2022-05-06 DIAGNOSIS — M47818 Spondylosis without myelopathy or radiculopathy, sacral and sacrococcygeal region: Secondary | ICD-10-CM

## 2022-05-06 DIAGNOSIS — M461 Sacroiliitis, not elsewhere classified: Secondary | ICD-10-CM | POA: Insufficient documentation

## 2022-05-06 MED ORDER — CYANOCOBALAMIN 1000 MCG/ML IJ SOLN
1000.0000 ug | Freq: Once | INTRAMUSCULAR | Status: AC
  Administered 2022-05-06: 1000 ug via INTRAMUSCULAR

## 2022-05-06 MED ORDER — GABAPENTIN 100 MG PO CAPS: 100.0000 mg | ORAL_CAPSULE | Freq: Every day | ORAL | 0 refills | Status: DC

## 2022-05-06 NOTE — Progress Notes (Signed)
[place] Phone: [phone] Subjective:   Fontaine No, am serving as a scribe for Dr. [name].   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 Last seen in December 2022. Patient states that her R knee pain is sharp at times. No pattern to her pain. Occurring in front of her knee.   Also having pain that radiates down L side of lumbar spine into the L calf. Pain comes and goes in this area. Dug up a plant recently and might have exacerbated her pain but this was going on prior to digging.      Past Medical History:  Diagnosis Date   Abnormal stress test    a. 09/2016: NST showed a small defect of mild severity present in the basal inferoseptal and mid inferoseptal location, consistent with ischemia. --> medically managed   ALLERGIC RHINITIS    Anxiety    Bursitis    DDD (degenerative disc disease), lumbar    ESI with Ramos (spring 2016)   Depression    Diabetes mellitus without complication (HCC)    Type II   Dyslipidemia    External hemorrhoids    GERD (gastroesophageal reflux disease)    Hypertension    Obesity    Osteoarthritis    Palpitations    a. prior event monitor showing sinus tachycardia, no PAF.    Panic attacks    Hx of depression   Sleep apnea    No Cpap   Past Surgical History:  Procedure Laterality Date   ABDOMINAL HYSTERECTOMY     DILATION AND CURETTAGE OF UTERUS     MASS EXCISION Left 12/30/2015   Procedure: EXCISION LEFT LEG MASS;  Surgeon: Donnie Mesa, MD;  Location: [facility];  Service: General;  Laterality: Left;   NASAL TURBINATE REDUCTION Bilateral 03/04/2022   Procedure: TURBINATE REDUCTION;  Surgeon: Melida Quitter, MD;  Location: [facility] OR;  Service: ENT;  Laterality: Bilateral;   REPLACEMENT TOTAL  KNEE Right    RIGHT OOPHORECTOMY     No cancer   Social History   Socioeconomic History   Marital status: Married    Spouse name: [name]   Number of children: 3   Years of education: Not on file   Highest education level: Not on file  Occupational History   Occupation: Disabled  Tobacco Use   Smoking status: Never   Smokeless tobacco: Never   Tobacco comments:    tried to as a teenager  Media planner   Vaping Use: Never used  Substance and Sexual Activity   Alcohol use: Yes    Alcohol/week: 1.0 standard drink of alcohol    Types: 1 Glasses of wine per week    Comment: one drink a month   Drug use: No   Sexual activity: Not Currently  Other Topics Concern   Not on file  Social History Narrative   Married with children. Pt is on disablity. Previous worked in the [employer] Strain: [place]  (02/06/2021)   Overall Financial Resource Strain (CARDIA)    Difficulty of Paying Living Expenses: Not hard at all  Food Insecurity: No Food Insecurity (02/06/2021)   Hunger Vital Sign  Worried About Charity fundraiser in the Last Year: Never true    [place] in the Last Year: Never true  Transportation Needs: No Transportation Needs (02/06/2021)   PRAPARE - Hydrologist (Medical): No    Lack of Transportation (Non-Medical): No  Physical Activity: Inactive (02/06/2021)   Exercise Vital Sign    Days of Exercise per Week: 0 days    Minutes of Exercise per Session: 0 min  Stress: No Stress Concern Present (02/06/2021)   [place]    Feeling of Stress : Not at all  Social Connections: [place] (02/06/2021)   Social Connection and Isolation Panel [NHANES]    Frequency of Communication with Friends and Family: More than three times a week    Frequency of Social Gatherings with Friends and Family: Once a week    Attends  Religious Services: More than 4 times per year    Active Member of [employer] or Organizations: No    Attends Music therapist: More than 4 times per year    Marital Status: Married   Allergies  Allergen Reactions   Seroquel [Quetiapine Fumarate] Other (See Comments)    Hallucinations, palpitations   Codeine Nausea And Vomiting   Guaifenesin-Codeine Other (See Comments)     feels spacey   Wellbutrin [Bupropion] Palpitations    hallucinations   Family History  Problem Relation Age of Onset   Heart disease Mother        Enlarged Heart, Pacemaker   Diabetes Mother    Thyroid disease Mother    Hyperlipidemia Mother    Hypertension Mother    Sleep apnea Mother    Dementia Father    Kidney failure Father    Prostate cancer Father    Alcoholism Father    Asthma Other    Allergies Sister    Asthma Son    Breast cancer Maternal Aunt        cousin   Colon cancer Cousin    Heart disease Son        CHF, morbid obesity   Prostate cancer Maternal Uncle    Diabetes Son     Current Outpatient Medications (Endocrine & Metabolic):    dapagliflozin propanediol (FARXIGA) 10 MG TABS tablet, Take 1 tablet (10 mg total) by mouth daily before breakfast.   Semaglutide (RYBELSUS) 7 MG TABS, Take 7 mg by mouth daily.  Current Outpatient Medications (Cardiovascular):    atenolol (TENORMIN) 25 MG tablet, TAKE ONE TABLET BY MOUTH IN THE MORNING AND TAKE TWO TABLETS IN THE EVENING   furosemide (LASIX) 40 MG tablet, TAKE 1 TABLET BY MOUTH EVERY DAY   propranolol (INDERAL) 10 MG tablet, Take 1 tablet (10 mg total) by mouth 3 (three) times daily as needed (PALPITATIONS).   rosuvastatin (CRESTOR) 20 MG tablet, Take 1 tablet (20 mg total) by mouth daily. (Patient taking differently: Take 20 mg by mouth every evening.)  Current Outpatient Medications (Respiratory):    albuterol (VENTOLIN HFA) 108 (90 Base) MCG/ACT inhaler, Inhale 2 puffs into the lungs every 6 (six) hours as needed for wheezing  or shortness of breath.   fluticasone (FLONASE) 50 MCG/ACT nasal spray, Place 2 sprays into both nostrils daily. (Patient taking differently: Place 2 sprays into both nostrils in the morning and at bedtime.)   levocetirizine (XYZAL) 5 MG tablet, Take 1 tablet (5 mg total) by mouth every evening.  Current Outpatient Medications (Analgesics):  SUMAtriptan-naproxen (TREXIMET) 85-500 MG tablet, Take 1 pill as needed for migraine, if migraine persists may repeat x1 after 2 hours  Current Outpatient Medications (Hematological):    cyanocobalamin (,VITAMIN B-12,) 1000 MCG/ML injection, Inject 1,000 mcg into the muscle every 30 (thirty) days.  Current Outpatient Medications (Other):    ALPRAZolam (XANAX) 0.5 MG tablet, Take 0.5 mg by mouth in the morning and at bedtime.   AMBULATORY NON FORMULARY MEDICATION, 4 Prong cane   blood glucose meter kit and supplies KIT, Dispense based on patient and insurance preference. Use up to four times daily as directed. (FOR E11.9).   gabapentin (NEURONTIN) 100 MG capsule, Take 1 capsule (100 mg total) by mouth at bedtime.   hydrOXYzine (ATARAX) 25 MG tablet, Take 25 mg by mouth 3 (three) times daily as needed for itching (allergies).   linaclotide (LINZESS) 290 MCG CAPS capsule, Take 1 capsule (290 mcg total) by mouth daily before breakfast. (Patient taking differently: Take 290 mcg by mouth daily as needed (constipation.).)   Multiple Vitamins-Minerals (MULTIVITAMIN WITH MINERALS) tablet, Take 1 tablet by mouth daily.   omeprazole (PRILOSEC) 20 MG capsule, Take 1 capsule (20 mg total) by mouth 2 (two) times daily before a meal. (Patient taking differently: Take 20 mg by mouth every evening.)   potassium chloride SA (KLOR-CON) 20 MEQ tablet, Take 1 tablet (20 mEq total) by mouth daily. 1 tablet by mouth daily (Patient taking differently: Take 20 mEq by mouth every evening.)   Tart Cherry (TART CHERRY ULTRA) 1200 MG CAPS, Take 1 capsule by mouth every evening.    tiZANidine (ZANAFLEX) 4 MG tablet, Take 1 tablet (4 mg total) by mouth at bedtime.   TURMERIC PO, Take 1 capsule by mouth daily. Vita b-12  & b-6   venlafaxine XR (EFFEXOR-XR) 150 MG 24 hr capsule, Take 1 capsule (150 mg total) by mouth 2 (two) times daily.   Vitamin D, Ergocalciferol, (DRISDOL) 1.25 MG (50000 UNIT) CAPS capsule, Take 1 capsule (50,000 Units total) by mouth every 7 (seven) days.   Reviewed prior external information including notes and imaging from  primary care provider As well as notes that were available from care everywhere and other healthcare systems.  Past medical history, social, surgical and family history all reviewed in electronic medical record.  No pertanent information unless stated regarding to the chief complaint.   Review of Systems:  No headache, visual changes, nausea, vomiting, diarrhea, constipation, dizziness, abdominal pain, skin rash, fevers, chills, night sweats, weight loss, swollen lymph nodes, body aches, joint swelling, chest pain, shortness of breath, mood changes. POSITIVE muscle aches  Objective  Blood pressure 118/78, pulse 76, height 5' 7"  (1.702 m), weight 234 lb (106.1 kg), SpO2 97 %.   General: No apparent distress alert and oriented x3 mood and affect normal, dressed appropriately.  HEENT: Pupils equal, extraocular movements intact  Respiratory: Patient's speak in full sentences and does not appear short of breath  Cardiovascular: No lower extremity edema, non tender, no erythema  Severely antalgic gait noted.  Patient does have a positive straight leg test on the left side.  Severely tender to palpation over the greater trochanteric area and the sacroiliac joint. Patient recently was to be neurovascularly intact but does have worsening pain with straight leg on the left  After verbal consent patient was prepped with alcohol swab and with a 21-gauge 2 inch needle injected into the left sacroiliac joint with a total of 2 cc of 0.5%  Marcaine and 1 cc of  Kenalog 40 mg/mL.  No blood loss.  Band-Aid placed.  Postinjection instructions given   Impression and Recommendations:    The above documentation has been reviewed and is accurate and complete Lyndal Pulley, DO

## 2022-05-06 NOTE — Assessment & Plan Note (Signed)
Patient given injection and tolerated the procedure well.  Discussed which activities to do We will see the patient response to the injections.  Follow-up again in 6 to 8 weeks

## 2022-05-06 NOTE — Assessment & Plan Note (Signed)
I believe the patient's pain is secondary to her back.  Patient is having pain over the sacroiliac joints and did attempt to do the sacroiliac injection to see how patient responds.  Depending on how patient does we will see how patient responds to the epidural.  Follow-up with me again in 6 to 8 weeks. Start gabapentin 100 mg in the interim as well.

## 2022-05-06 NOTE — Patient Instructions (Addendum)
[facility] [phone] Call Today  When we receive your results we will contact you.  Gabapentin prescribed See you again in 6 weeks

## 2022-05-10 ENCOUNTER — Encounter: Payer: Self-pay | Admitting: Internal Medicine

## 2022-05-10 DIAGNOSIS — E559 Vitamin D deficiency, unspecified: Secondary | ICD-10-CM | POA: Insufficient documentation

## 2022-05-10 NOTE — Patient Instructions (Addendum)
     Blood work was ordered.     Medications changes include :  none     Return in about 6 months (around 11/11/2022) for follow up.

## 2022-05-10 NOTE — Progress Notes (Unsigned)
Subjective:    Patient ID: Erica [name], female    DOB: [birth year], 66 y.o.   MRN: [id number]      HPI Erica [name] is here for a follow-up visit.    In May she had nasal turbinate reduction.  Still having difficulty with cpap.  She may be a candidate for inspire if she loses more weight.  She would like to lose enough weight that she does not need anything at all.   At her last visit we increased omeprazole.  This improved her GERD and she is now back to taking it once a day.   Eating lots of fruit.  Eats skinny popcorn.  She eats one meal a day.    Medications and allergies reviewed with patient and updated if appropriate.  Current Outpatient Medications on File Prior to Visit  Medication Sig Dispense Refill   albuterol (VENTOLIN HFA) 108 (90 Base) MCG/ACT inhaler Inhale 2 puffs into the lungs every 6 (six) hours as needed for wheezing or shortness of breath. 16 g 3   ALPRAZolam (XANAX) 0.5 MG tablet Take 0.5 mg by mouth in the morning and at bedtime.     atenolol (TENORMIN) 25 MG tablet TAKE ONE TABLET BY MOUTH IN THE MORNING AND TAKE TWO TABLETS IN THE EVENING 90 tablet 2   blood glucose meter kit and supplies KIT Dispense based on patient and insurance preference. Use up to four times daily as directed. (FOR E11.9). 1 each 0   cyanocobalamin (,VITAMIN B-12,) 1000 MCG/ML injection Inject 1,000 mcg into the muscle every 30 (thirty) days.     dapagliflozin propanediol (FARXIGA) 10 MG TABS tablet Take 1 tablet (10 mg total) by mouth daily before breakfast. 90 tablet 1   fluticasone (FLONASE) 50 MCG/ACT nasal spray Place 2 sprays into both nostrils daily. (Patient taking differently: Place 2 sprays into both nostrils in the morning and at bedtime.) 16 g 11   furosemide (LASIX) 40 MG tablet TAKE 1 TABLET BY MOUTH EVERY DAY 90 tablet 1   gabapentin (NEURONTIN) 100 MG capsule Take 1 capsule (100 mg total) by mouth at bedtime. 90 capsule 0   hydrOXYzine (ATARAX) 25 MG tablet Take  25 mg by mouth 3 (three) times daily as needed for itching (allergies).     levocetirizine (XYZAL) 5 MG tablet Take 1 tablet (5 mg total) by mouth every evening. 90 tablet 2   linaclotide (LINZESS) 290 MCG CAPS capsule Take 1 capsule (290 mcg total) by mouth daily before breakfast. (Patient taking differently: Take 290 mcg by mouth daily as needed (constipation.).) 90 capsule 2   Multiple Vitamins-Minerals (MULTIVITAMIN WITH MINERALS) tablet Take 1 tablet by mouth daily.     omeprazole (PRILOSEC) 20 MG capsule Take 1 capsule (20 mg total) by mouth 2 (two) times daily before a meal. (Patient taking differently: Take 20 mg by mouth every evening.) 180 capsule 2   potassium chloride SA (KLOR-CON) 20 MEQ tablet Take 1 tablet (20 mEq total) by mouth daily. 1 tablet by mouth daily (Patient taking differently: Take 20 mEq by mouth every evening.) 90 tablet 3   propranolol (INDERAL) 10 MG tablet Take 1 tablet (10 mg total) by mouth 3 (three) times daily as needed (PALPITATIONS). 60 tablet 3   rosuvastatin (CRESTOR) 20 MG tablet Take 1 tablet (20 mg total) by mouth daily. (Patient taking differently: Take 20 mg by mouth every evening.) 90 tablet 3   Semaglutide (RYBELSUS) 7 MG TABS Take 7 mg by mouth daily.  90 tablet 1   SUMAtriptan-naproxen (TREXIMET) 85-500 MG tablet Take 1 pill as needed for migraine, if migraine persists may repeat x1 after 2 hours 10 tablet 8   Tart Cherry (TART CHERRY ULTRA) 1200 MG CAPS Take 1 capsule by mouth every evening.     tiZANidine (ZANAFLEX) 4 MG tablet Take 1 tablet (4 mg total) by mouth at bedtime. 30 tablet 0   TURMERIC PO Take 1 capsule by mouth daily. Vita b-12  & b-6     venlafaxine XR (EFFEXOR-XR) 150 MG 24 hr capsule Take 1 capsule (150 mg total) by mouth 2 (two) times daily. 180 capsule 2   Vitamin D, Ergocalciferol, (DRISDOL) 1.25 MG (50000 UNIT) CAPS capsule Take 1 capsule (50,000 Units total) by mouth every 7 (seven) days. 12 capsule 0   [DISCONTINUED] oxycodone  (OXY-IR) 5 MG capsule Take 5 mg by mouth every 4 (four) hours as needed. For pain.     No current facility-administered medications on file prior to visit.    Review of Systems  Constitutional:  Negative for fever.  Respiratory:  Negative for cough, shortness of breath and wheezing.   Cardiovascular:  Negative for chest pain, palpitations and leg swelling.  Neurological:  Negative for light-headedness and headaches.       Objective:   Vitals:   05/11/22 1034  BP: 118/70  Pulse: 80  Temp: 98.6 F (37 C)  SpO2: 97%   Filed Weights   05/11/22 1034  Weight: 234 lb (106.1 kg)   Body mass index is 36.65 kg/m.  BP Readings from Last 3 Encounters:  05/11/22 118/70  05/06/22 118/78  03/26/22 112/68    Wt Readings from Last 3 Encounters:  05/11/22 234 lb (106.1 kg)  05/06/22 234 lb (106.1 kg)  03/26/22 231 lb 6.4 oz (105 kg)       Physical Exam Constitutional: She appears well-developed and well-nourished. No distress.  HENT:  Head: Normocephalic and atraumatic.  Right Ear: External ear normal. Normal ear canal and TM Left Ear: External ear normal.  Normal ear canal and TM Mouth/Throat: Oropharynx is clear and moist.  Eyes: Conjunctivae normal.  Neck: Neck supple. No tracheal deviation present. No thyromegaly present.  No carotid bruit  Cardiovascular: Normal rate, regular rhythm and normal heart sounds.   No murmur heard.  No edema. Pulmonary/Chest: Effort normal and breath sounds normal. No respiratory distress. She has no wheezes. She has no rales.  Breast: deferred   Abdominal: Soft. She exhibits no distension. There is no tenderness.  Lymphadenopathy: She has no cervical adenopathy.  Skin: Skin is warm and dry. She is not diaphoretic.  Psychiatric: She has a normal mood and affect. Her behavior is normal.     Lab Results  Component Value Date   WBC 6.2 03/04/2022   HGB 12.9 03/04/2022   HCT 39.3 03/04/2022   PLT 267 03/04/2022   GLUCOSE 115 (H)  03/04/2022   CHOL 144 11/11/2021   TRIG 101.0 11/11/2021   HDL 52.80 11/11/2021   LDLCALC 71 11/11/2021   ALT 14 11/11/2021   AST 17 11/11/2021   NA 139 03/04/2022   K 3.4 (L) 03/04/2022   CL 106 03/04/2022   CREATININE 0.81 03/04/2022   BUN 12 03/04/2022   CO2 25 03/04/2022   TSH 2.26 02/02/2021   HGBA1C 6.3 11/11/2021   MICROALBUR 0.8 11/11/2021         Assessment & Plan:   Physical exam: Screening blood work  ordered Exercise  water exercises  Weight--working on weight loss-discussed that she needs to decrease her fruit intake-likely consuming too many calories which is why she is not losing weight and has actually gained a little weight.  She understands this and agrees. Substance abuse  none   Reviewed recommended immunizations.   Health Maintenance  Topic Date Due   COVID-19 Vaccine (5 - Pfizer series) 05/25/2021   FOOT EXAM  06/02/2021   MAMMOGRAM  01/05/2022   OPHTHALMOLOGY EXAM  04/15/2022   HEMOGLOBIN A1C  05/11/2022   INFLUENZA VACCINE  01/09/2023 (Originally 05/11/2022)   Diabetic kidney evaluation - Urine ACR  11/11/2022   Diabetic kidney evaluation - GFR measurement  03/05/2023   COLONOSCOPY (Pts 45-53yr Insurance coverage will need to be confirmed)  05/08/2024   DEXA SCAN  07/28/2024   TETANUS/TDAP  09/09/2024   Pneumonia Vaccine 66 Years old  Completed   Hepatitis C Screening  Completed   Zoster Vaccines- Shingrix  Completed   HPV VACCINES  Aged Out          See Problem List for Assessment and Plan of chronic medical problems.

## 2022-05-11 ENCOUNTER — Other Ambulatory Visit: Payer: Self-pay

## 2022-05-11 ENCOUNTER — Ambulatory Visit (INDEPENDENT_AMBULATORY_CARE_PROVIDER_SITE_OTHER): Payer: Medicare Other | Admitting: Internal Medicine

## 2022-05-11 VITALS — BP 118/70 | HR 80 | Temp 98.6°F | Ht 67.0 in | Wt 234.0 lb

## 2022-05-11 DIAGNOSIS — E6609 Other obesity due to excess calories: Secondary | ICD-10-CM

## 2022-05-11 DIAGNOSIS — K5909 Other constipation: Secondary | ICD-10-CM | POA: Diagnosis not present

## 2022-05-11 DIAGNOSIS — E538 Deficiency of other specified B group vitamins: Secondary | ICD-10-CM

## 2022-05-11 DIAGNOSIS — I10 Essential (primary) hypertension: Secondary | ICD-10-CM

## 2022-05-11 DIAGNOSIS — E785 Hyperlipidemia, unspecified: Secondary | ICD-10-CM | POA: Diagnosis not present

## 2022-05-11 DIAGNOSIS — K219 Gastro-esophageal reflux disease without esophagitis: Secondary | ICD-10-CM | POA: Diagnosis not present

## 2022-05-11 DIAGNOSIS — J301 Allergic rhinitis due to pollen: Secondary | ICD-10-CM

## 2022-05-11 DIAGNOSIS — E1169 Type 2 diabetes mellitus with other specified complication: Secondary | ICD-10-CM | POA: Diagnosis not present

## 2022-05-11 DIAGNOSIS — Z0000 Encounter for general adult medical examination without abnormal findings: Secondary | ICD-10-CM

## 2022-05-11 DIAGNOSIS — Z6838 Body mass index (BMI) 38.0-38.9, adult: Secondary | ICD-10-CM

## 2022-05-11 DIAGNOSIS — E559 Vitamin D deficiency, unspecified: Secondary | ICD-10-CM

## 2022-05-11 LAB — CBC WITH DIFFERENTIAL/PLATELET
Basophils Absolute: 0 10*3/uL (ref 0.0–0.1)
Basophils Relative: 0.5 % (ref 0.0–3.0)
Eosinophils Absolute: 0 10*3/uL (ref 0.0–0.7)
Eosinophils Relative: 0.5 % (ref 0.0–5.0)
HCT: 37.5 % (ref 36.0–46.0)
Hemoglobin: 12.4 g/dL (ref 12.0–15.0)
Lymphocytes Relative: 24.5 % (ref 12.0–46.0)
Lymphs Abs: 1.9 10*3/uL (ref 0.7–4.0)
MCHC: 33.2 g/dL (ref 30.0–36.0)
MCV: 86.4 fl (ref 78.0–100.0)
Monocytes Absolute: 0.6 10*3/uL (ref 0.1–1.0)
Monocytes Relative: 7.3 % (ref 3.0–12.0)
Neutro Abs: 5.1 10*3/uL (ref 1.4–7.7)
Neutrophils Relative %: 67.2 % (ref 43.0–77.0)
Platelets: 296 10*3/uL (ref 150.0–400.0)
RBC: 4.34 Mil/uL (ref 3.87–5.11)
RDW: 14.9 % (ref 11.5–15.5)
WBC: 7.6 10*3/uL (ref 4.0–10.5)

## 2022-05-11 LAB — COMPREHENSIVE METABOLIC PANEL
ALT: 12 U/L (ref 0–35)
AST: 17 U/L (ref 0–37)
Albumin: 4 g/dL (ref 3.5–5.2)
Alkaline Phosphatase: 144 U/L — ABNORMAL HIGH (ref 39–117)
BUN: 11 mg/dL (ref 6–23)
CO2: 28 mEq/L (ref 19–32)
Calcium: 9.7 mg/dL (ref 8.4–10.5)
Chloride: 106 mEq/L (ref 96–112)
Creatinine, Ser: 0.7 mg/dL (ref 0.40–1.20)
GFR: 88.85 mL/min (ref >60.00)
Glucose, Bld: 91 mg/dL (ref 70–99)
Potassium: 3.6 mEq/L (ref 3.5–5.1)
Sodium: 139 mEq/L (ref 135–145)
Total Bilirubin: 0.3 mg/dL (ref 0.2–1.2)
Total Protein: 7.4 g/dL (ref 6.0–8.3)

## 2022-05-11 LAB — LIPID PANEL
Cholesterol: 155 mg/dL (ref 0–200)
HDL: 52.4 mg/dL (ref >39.00)
LDL Cholesterol: 86 mg/dL (ref 0–99)
NonHDL: 102.17
Total CHOL/HDL Ratio: 3
Triglycerides: 82 mg/dL (ref 0.0–149.0)
VLDL: 16.4 mg/dL (ref 0.0–40.0)

## 2022-05-11 LAB — HEMOGLOBIN A1C: Hgb A1c MFr Bld: 6.2 % (ref 4.6–6.5)

## 2022-05-11 MED ORDER — FUROSEMIDE 40 MG PO TABS: 40.0000 mg | ORAL_TABLET | Freq: Every day | ORAL | 2 refills | Status: DC

## 2022-05-11 MED ORDER — RYBELSUS 7 MG PO TABS: 7.0000 mg | ORAL_TABLET | Freq: Every day | ORAL | 1 refills | Status: DC

## 2022-05-11 MED ORDER — VENLAFAXINE HCL ER 150 MG PO CP24: 150.0000 mg | ORAL_CAPSULE | Freq: Two times a day (BID) | ORAL | 2 refills | Status: DC

## 2022-05-11 MED ORDER — LINACLOTIDE 290 MCG PO CAPS: 290.0000 ug | ORAL_CAPSULE | Freq: Every day | ORAL | 2 refills | Status: DC

## 2022-05-11 MED ORDER — SUMATRIPTAN-NAPROXEN SODIUM 85-500 MG PO TABS: ORAL_TABLET | ORAL | 8 refills | Status: AC

## 2022-05-11 MED ORDER — DAPAGLIFLOZIN PROPANEDIOL 10 MG PO TABS
10.0000 mg | ORAL_TABLET | Freq: Every day | ORAL | 1 refills | Status: DC
Start: 2022-05-11 — End: 2022-11-11

## 2022-05-11 MED ORDER — ROSUVASTATIN CALCIUM 20 MG PO TABS: 20.0000 mg | ORAL_TABLET | Freq: Every day | ORAL | 2 refills | Status: DC

## 2022-05-11 MED ORDER — LEVOCETIRIZINE DIHYDROCHLORIDE 5 MG PO TABS: 5.0000 mg | ORAL_TABLET | Freq: Every evening | ORAL | 2 refills | Status: DC

## 2022-05-11 MED ORDER — OMEPRAZOLE 20 MG PO CPDR: 20.0000 mg | DELAYED_RELEASE_CAPSULE | Freq: Two times a day (BID) | ORAL | 2 refills | Status: DC | PRN

## 2022-05-11 MED ORDER — ATENOLOL 25 MG PO TABS: ORAL_TABLET | ORAL | 2 refills | Status: DC

## 2022-05-11 NOTE — Assessment & Plan Note (Signed)
Chronic Continue Linzess 290 mcg daily as needed

## 2022-05-11 NOTE — Assessment & Plan Note (Addendum)
Chronic Improved, GERD controlled Back to omeprazole 20 mg once daily - advised to inc to bid prn for flares

## 2022-05-11 NOTE — Assessment & Plan Note (Signed)
Chronic  Lab Results  Component Value Date   HGBA1C 6.3 11/11/2021   Sugars well controlled Check A1c Continue Rybelsus 7 mg daily, Farxiga, 10 mg daily Stressed regular exercise, diabetic diet

## 2022-05-11 NOTE — Assessment & Plan Note (Addendum)
Chronic Continue B12 injections monthly 

## 2022-05-11 NOTE — Assessment & Plan Note (Signed)
Chronic Regular exercise and healthy diet encouraged Check lipid panel  Continue Crestor 20 mg daily 

## 2022-05-11 NOTE — Assessment & Plan Note (Signed)
Chronic Blood pressure well controlled CMP Continue atenolol 25 mg a.m., 50 mg p.m.

## 2022-05-11 NOTE — Assessment & Plan Note (Signed)
Chronic She is working on weight loss Rybelsus has helped decrease her appetite and she is eating less She has seen some weight loss, but is currently consuming too many calories-likely eating too much fruit and weight has gone up slightly Stressed decreasing fruit intake Get back to regular exercise which she is starting to do Continue Rybelsus 7 mg daily-can consider increasing if needed

## 2022-05-15 ENCOUNTER — Encounter: Payer: Self-pay | Admitting: Internal Medicine

## 2022-05-19 ENCOUNTER — Ambulatory Visit
Admission: RE | Admit: 2022-05-19 | Discharge: 2022-05-19 | Disposition: A | Discharge status: Home / Self Care | Payer: Medicare Other | Source: Ambulatory Visit | Attending: Family Medicine | Admitting: Family Medicine

## 2022-05-19 DIAGNOSIS — M5136 Other intervertebral disc degeneration, lumbar region: Secondary | ICD-10-CM

## 2022-05-19 DIAGNOSIS — M5116 Intervertebral disc disorders with radiculopathy, lumbar region: Secondary | ICD-10-CM | POA: Diagnosis not present

## 2022-05-19 MED ORDER — IOPAMIDOL (ISOVUE-M 200) INJECTION 41%
1.0000 mL | Freq: Once | INTRAMUSCULAR | Status: AC
  Administered 2022-05-19: 1 mL via EPIDURAL

## 2022-05-19 MED ORDER — METHYLPREDNISOLONE ACETATE 40 MG/ML INJ SUSP (RADIOLOG
80.0000 mg | Freq: Once | INTRAMUSCULAR | Status: AC
  Administered 2022-05-19: 80 mg via EPIDURAL

## 2022-05-19 NOTE — Discharge Instructions (Signed)

## 2022-05-24 ENCOUNTER — Ambulatory Visit: Payer: Medicare Other | Admitting: Family Medicine

## 2022-05-27 ENCOUNTER — Ambulatory Visit: Payer: Medicare Other

## 2022-05-27 DIAGNOSIS — H2513 Age-related nuclear cataract, bilateral: Secondary | ICD-10-CM | POA: Diagnosis not present

## 2022-05-27 DIAGNOSIS — H43811 Vitreous degeneration, right eye: Secondary | ICD-10-CM | POA: Diagnosis not present

## 2022-05-27 DIAGNOSIS — E119 Type 2 diabetes mellitus without complications: Secondary | ICD-10-CM | POA: Diagnosis not present

## 2022-05-27 DIAGNOSIS — G4733 Obstructive sleep apnea (adult) (pediatric): Secondary | ICD-10-CM | POA: Diagnosis not present

## 2022-05-27 DIAGNOSIS — H40013 Open angle with borderline findings, low risk, bilateral: Secondary | ICD-10-CM | POA: Diagnosis not present

## 2022-05-27 LAB — HM DIABETES EYE EXAM

## 2022-05-31 DIAGNOSIS — G4733 Obstructive sleep apnea (adult) (pediatric): Secondary | ICD-10-CM | POA: Diagnosis not present

## 2022-06-03 ENCOUNTER — Telehealth: Payer: Self-pay | Admitting: Pulmonary Disease

## 2022-06-03 ENCOUNTER — Encounter: Payer: Self-pay | Admitting: Internal Medicine

## 2022-06-03 NOTE — Telephone Encounter (Signed)
Called and spoke to patient. She voiced understanding of results and recommendations. Patient asked that we make her an ov to further discuss options. She requested to only see Dr. [name]. Scheduled her for an appt for 07/20/22 (next available In [place] office). She states she is going to try to use her cpap in the meantime while waiting for her appt. Nothing further needed

## 2022-06-03 NOTE — Telephone Encounter (Signed)
HST showed severe OSA with AHI 34/1 and low saturation of 80%.  Unfortunately, even with her weight loss, severe sleep apnea persists. She has been unable to tolerate CPAP and I doubt that oral appliance will be helpful for this degree of OSA. We had discussed inspire implant on her last visit.  If she wants to pursue this we can refer to ENT. If she needs further discussion, please schedule office visit

## 2022-06-03 NOTE — Progress Notes (Signed)
Outside notes received. Information abstracted. Notes sent to scan.  

## 2022-06-09 DIAGNOSIS — G4733 Obstructive sleep apnea (adult) (pediatric): Secondary | ICD-10-CM | POA: Diagnosis not present

## 2022-06-10 NOTE — Progress Notes (Signed)
[facility] [place] Phone: [phone] Subjective:   Erica [name], am serving as a scribe for Dr. [name].  I'm seeing this patient by the request  of:  [name], MD  CC: Low back pain  RKY:[id number]  05/06/2022 Patient given injection and tolerated the procedure well.  Discussed which activities to do We will see the patient response to the injections.  Follow-up again in 6 to 8 weeks  I believe the patient's pain is secondary to her back.  Patient is having pain over the sacroiliac joints and did attempt to do the sacroiliac injection to see how patient responds.  Depending on how patient does we will see how patient responds to the epidural.  Follow-up with me again in 6 to 8 weeks. Start gabapentin 100 mg in the interim as well.  Updated 06/17/2022 Erica [name] is a 66 y.o. female coming in with complaint of hip and back pain. SI joint injection last visit and Epi on 05/19/2022. Patient states that one week ago the sciatic nerve pain and numbness came back in L leg. Does feel like SI joint injection was helpful.      Past Medical History:  Diagnosis Date   Abnormal stress test    a. 09/2016: NST showed a small defect of mild severity present in the basal inferoseptal and mid inferoseptal location, consistent with ischemia. --> medically managed   ALLERGIC RHINITIS    Anxiety    Bursitis    DDD (degenerative disc disease), lumbar    ESI with Ramos (spring 2016)   Depression    Diabetes mellitus without complication (HCC)    Type II   Dyslipidemia    External hemorrhoids    GERD (gastroesophageal reflux disease)    Hypertension    Obesity    Osteoarthritis    Palpitations    a. prior event monitor showing sinus tachycardia, [name] PAF.    Panic attacks    Hx of depression   Sleep apnea    [name] Cpap   Past Surgical History:  Procedure Laterality Date   ABDOMINAL HYSTERECTOMY     DILATION AND  CURETTAGE OF UTERUS     MASS EXCISION Left 12/30/2015   Procedure: EXCISION LEFT LEG MASS;  Surgeon: Donnie Mesa, MD;  Location: [facility];  Service: General;  Laterality: Left;   NASAL TURBINATE REDUCTION Bilateral 03/04/2022   Procedure: TURBINATE REDUCTION;  Surgeon: Melida Quitter, MD;  Location: [facility] OR;  Service: ENT;  Laterality: Bilateral;   REPLACEMENT TOTAL KNEE Right    RIGHT OOPHORECTOMY     [name] cancer   Social History   Socioeconomic History   Marital status: Married    Spouse name: [name]   Number of children: 3   Years of education: Not on file   Highest education level: Not on file  Occupational History   Occupation: Disabled  Tobacco Use   Smoking status: Never   Smokeless tobacco: Never   Tobacco comments:    tried to as a teenager  Media planner   Vaping Use: Never used  Substance and Sexual Activity   Alcohol use: Yes    Alcohol/week: 1.0 standard drink of alcohol    Types: 1 Glasses of wine per week    Comment: one drink a month   Drug use: [name]   Sexual activity: Not Currently  Other Topics Concern   Not on file  Social History Narrative  Married with children. Pt is on disablity. Previous worked in the [place] Strain: [place]  (02/06/2021)   Overall Financial Resource Strain (CARDIA)    Difficulty of Paying Living Expenses: Not hard at all  Food Insecurity: [name] Food Insecurity (02/06/2021)   Hunger Vital Sign    Worried About Running Out of Food in the Last Year: Never true    Ran Out of Food in the Last Year: Never true  Transportation Needs: [name] Transportation Needs (02/06/2021)   PRAPARE - Hydrologist (Medical): [name]    Lack of Transportation (Non-Medical): [name]  Physical Activity: Inactive (02/06/2021)   Exercise Vital Sign    Days of Exercise per Week: 0 days    Minutes of Exercise per Session: 0 min  Stress: [name] Stress Concern Present (02/06/2021)    [place]    Feeling of Stress : Not at all  Social Connections: [place] (02/06/2021)   Social Connection and Isolation Panel [NHANES]    Frequency of Communication with Friends and Family: More than three times a week    Frequency of Social Gatherings with Friends and Family: Once a week    Attends Religious Services: More than 4 times per year    Active Member of [employer] or Organizations: [name]    Attends Music therapist: More than 4 times per year    Marital Status: Married   Allergies  Allergen Reactions   Seroquel [Quetiapine Fumarate] Other (See Comments)    Hallucinations, palpitations   Codeine Nausea And Vomiting   Guaifenesin-Codeine Other (See Comments)     feels spacey   Wellbutrin [Bupropion] Palpitations    hallucinations   Family History  Problem Relation Age of Onset   Heart disease Mother        Enlarged Heart, Pacemaker   Diabetes Mother    Thyroid disease Mother    Hyperlipidemia Mother    Hypertension Mother    Sleep apnea Mother    Dementia Father    Kidney failure Father    Prostate cancer Father    Alcoholism Father    Asthma Other    Allergies Sister    Asthma Son    Breast cancer Maternal Aunt        cousin   Colon cancer Cousin    Heart disease Son        CHF, morbid obesity   Prostate cancer Maternal Uncle    Diabetes Son     Current Outpatient Medications (Endocrine & Metabolic):    dapagliflozin propanediol (FARXIGA) 10 MG TABS tablet, Take 1 tablet (10 mg total) by mouth daily before breakfast.   Semaglutide (RYBELSUS) 7 MG TABS, Take 7 mg by mouth daily.  Current Outpatient Medications (Cardiovascular):    atenolol (TENORMIN) 25 MG tablet, TAKE ONE TABLET BY MOUTH IN THE MORNING AND TAKE TWO TABLETS IN THE EVENING   furosemide (LASIX) 40 MG tablet, Take 1 tablet (40 mg total) by mouth daily.   propranolol (INDERAL) 10 MG tablet, Take 1 tablet (10  mg total) by mouth 3 (three) times daily as needed (PALPITATIONS).   rosuvastatin (CRESTOR) 20 MG tablet, Take 1 tablet (20 mg total) by mouth daily.  Current Outpatient Medications (Respiratory):    albuterol (VENTOLIN HFA) 108 (90 Base) MCG/ACT inhaler, Inhale 2 puffs into the lungs every 6 (six) hours as needed for wheezing or shortness  of breath.   fluticasone (FLONASE) 50 MCG/ACT nasal spray, Place 2 sprays into both nostrils daily. (Patient taking differently: Place 2 sprays into both nostrils in the morning and at bedtime.)   levocetirizine (XYZAL) 5 MG tablet, Take 1 tablet (5 mg total) by mouth every evening.  Current Outpatient Medications (Analgesics):    SUMAtriptan-naproxen (TREXIMET) 85-500 MG tablet, Take 1 pill as needed for migraine, if migraine persists may repeat x1 after 2 hours  Current Outpatient Medications (Hematological):    cyanocobalamin (,VITAMIN B-12,) 1000 MCG/ML injection, Inject 1,000 mcg into the muscle every 30 (thirty) days.  Current Outpatient Medications (Other):    ALPRAZolam (XANAX) 0.5 MG tablet, Take 0.5 mg by mouth in the morning and at bedtime.   blood glucose meter kit and supplies KIT, Dispense based on patient and insurance preference. Use up to four times daily as directed. (FOR E11.9).   gabapentin (NEURONTIN) 100 MG capsule, Take 1 capsule (100 mg total) by mouth at bedtime.   hydrOXYzine (ATARAX) 25 MG tablet, Take 25 mg by mouth 3 (three) times daily as needed for itching (allergies).   linaclotide (LINZESS) 290 MCG CAPS capsule, Take 1 capsule (290 mcg total) by mouth daily before breakfast.   Multiple Vitamins-Minerals (MULTIVITAMIN WITH MINERALS) tablet, Take 1 tablet by mouth daily.   omeprazole (PRILOSEC) 20 MG capsule, Take 1 capsule (20 mg total) by mouth 2 (two) times daily as needed (take 30 minutes prior to a meal).   potassium chloride SA (KLOR-CON) 20 MEQ tablet, Take 1 tablet (20 mEq total) by mouth daily. 1 tablet by mouth daily  (Patient taking differently: Take 20 mEq by mouth every evening.)   Tart Cherry (TART CHERRY ULTRA) 1200 MG CAPS, Take 1 capsule by mouth every evening.   tiZANidine (ZANAFLEX) 4 MG tablet, Take 1 tablet (4 mg total) by mouth at bedtime.   TURMERIC PO, Take 1 capsule by mouth daily. Vita b-12  & b-6   venlafaxine XR (EFFEXOR-XR) 150 MG 24 hr capsule, Take 1 capsule (150 mg total) by mouth 2 (two) times daily.   Vitamin D, Ergocalciferol, (DRISDOL) 1.25 MG (50000 UNIT) CAPS capsule, Take 1 capsule (50,000 Units total) by mouth every 7 (seven) days.   Reviewed prior external information including notes and imaging from  primary care provider As well as notes that were available from care everywhere and other healthcare systems.  Past medical history, social, surgical and family history all reviewed in electronic medical record.  [name] pertanent information unless stated regarding to the chief complaint.   Review of Systems:  [name] headache, visual changes, nausea, vomiting, diarrhea, constipation, dizziness, abdominal pain, skin rash, fevers, chills, night sweats, weight loss, swollen lymph nodes, body aches, joint swelling, chest pain, shortness of breath, mood changes. POSITIVE muscle aches  Objective  Blood pressure 118/82, pulse 77, height $RemoveBe'5\' 7"'ooTvaTvhp$  (1.702 m), weight 229 lb (103.9 kg), SpO2 97 %.   General: [name] apparent distress alert and oriented x3 mood and affect normal, dressed appropriately.  HEENT: Pupils equal, extraocular movements intact  Respiratory: Patient's speak in full sentences and does not appear short of breath  Cardiovascular: [name] lower extremity edema, non tender, [name] erythema  Low back exam does have some loss of lordosis.  Patient does seem very mild tenderness to palpation noted at this time.  Patient does have positive straight leg test noted as well at 30 degrees of flexion.  Worsening pain with extension.    Impression and Recommendations:    The above  documentation has  been reviewed and is accurate and complete Lyndal Pulley, DO

## 2022-06-11 ENCOUNTER — Telehealth: Payer: Self-pay

## 2022-06-11 ENCOUNTER — Ambulatory Visit: Payer: Medicare Other

## 2022-06-11 NOTE — Telephone Encounter (Signed)
Called patient x3 with no answer, patient may reschedule for next available appointment Jadene Pierini, LPN .

## 2022-06-16 DIAGNOSIS — B3731 Acute candidiasis of vulva and vagina: Secondary | ICD-10-CM | POA: Diagnosis not present

## 2022-06-17 ENCOUNTER — Ambulatory Visit (INDEPENDENT_AMBULATORY_CARE_PROVIDER_SITE_OTHER): Payer: Medicare Other

## 2022-06-17 ENCOUNTER — Encounter: Payer: Self-pay | Admitting: Family Medicine

## 2022-06-17 ENCOUNTER — Ambulatory Visit (INDEPENDENT_AMBULATORY_CARE_PROVIDER_SITE_OTHER): Payer: Medicare Other | Admitting: Family Medicine

## 2022-06-17 VITALS — BP 118/82 | HR 77 | Ht 67.0 in | Wt 229.0 lb

## 2022-06-17 DIAGNOSIS — M5137 Other intervertebral disc degeneration, lumbosacral region: Secondary | ICD-10-CM | POA: Diagnosis not present

## 2022-06-17 DIAGNOSIS — E538 Deficiency of other specified B group vitamins: Secondary | ICD-10-CM

## 2022-06-17 DIAGNOSIS — M545 Low back pain, unspecified: Secondary | ICD-10-CM | POA: Diagnosis not present

## 2022-06-17 DIAGNOSIS — M47816 Spondylosis without myelopathy or radiculopathy, lumbar region: Secondary | ICD-10-CM | POA: Diagnosis not present

## 2022-06-17 MED ORDER — VITAMIN D (ERGOCALCIFEROL) 1.25 MG (50000 UNIT) PO CAPS: 50000.0000 [IU] | ORAL_CAPSULE | ORAL | 0 refills | Status: DC

## 2022-06-17 MED ORDER — CYANOCOBALAMIN 1000 MCG/ML IJ SOLN
1000.0000 ug | Freq: Once | INTRAMUSCULAR | Status: AC
  Administered 2022-06-17: 1000 ug via INTRAMUSCULAR

## 2022-06-17 NOTE — Patient Instructions (Addendum)
Xray today MRI lumbar spine [phone]

## 2022-06-17 NOTE — Assessment & Plan Note (Signed)
Pain with left-sided radicular symptoms.  Seems to be worsening.  No longer responding to the epidurals.  Patient does have what appears to be an L5 or S1 nerve root impingement on the left side that is worsening symptoms.  Last imaging was nearly 4 years ago and will get MRI to further evaluate.  Depending on findings this could change management of injections, medical or possible surgical intervention.  We will follow-up after imaging.

## 2022-06-22 ENCOUNTER — Telehealth: Payer: Self-pay | Admitting: Internal Medicine

## 2022-06-22 NOTE — Telephone Encounter (Signed)
LVM for pt to rtn my call to schedule AWV with [facility] call back # [phone]

## 2022-06-29 ENCOUNTER — Telehealth: Payer: Self-pay | Admitting: Cardiology

## 2022-06-29 DIAGNOSIS — I6523 Occlusion and stenosis of bilateral carotid arteries: Secondary | ICD-10-CM

## 2022-06-29 DIAGNOSIS — E119 Type 2 diabetes mellitus without complications: Secondary | ICD-10-CM | POA: Diagnosis not present

## 2022-06-29 NOTE — Telephone Encounter (Signed)
Spoke with pt, carotid dopplers ordered and scheduled.

## 2022-06-29 NOTE — Telephone Encounter (Signed)
Patient called stating she is due for a carotid study, that she gets one done every year in September.

## 2022-06-29 NOTE — Telephone Encounter (Signed)
Routed to MD to review request for order

## 2022-07-01 ENCOUNTER — Encounter (HOSPITAL_COMMUNITY): Payer: Medicare Other

## 2022-07-02 ENCOUNTER — Other Ambulatory Visit: Payer: Medicare Other

## 2022-07-02 DIAGNOSIS — D1721 Benign lipomatous neoplasm of skin and subcutaneous tissue of right arm: Secondary | ICD-10-CM | POA: Diagnosis not present

## 2022-07-02 DIAGNOSIS — L309 Dermatitis, unspecified: Secondary | ICD-10-CM | POA: Diagnosis not present

## 2022-07-02 DIAGNOSIS — D1722 Benign lipomatous neoplasm of skin and subcutaneous tissue of left arm: Secondary | ICD-10-CM | POA: Diagnosis not present

## 2022-07-02 DIAGNOSIS — D1724 Benign lipomatous neoplasm of skin and subcutaneous tissue of left leg: Secondary | ICD-10-CM | POA: Diagnosis not present

## 2022-07-02 DIAGNOSIS — D1723 Benign lipomatous neoplasm of skin and subcutaneous tissue of right leg: Secondary | ICD-10-CM | POA: Diagnosis not present

## 2022-07-06 ENCOUNTER — Ambulatory Visit (HOSPITAL_COMMUNITY)
Admission: RE | Admit: 2022-07-06 | Discharge: 2022-07-06 | Disposition: A | Discharge status: Home / Self Care | Payer: Medicare Other | Source: Ambulatory Visit | Attending: Cardiology | Admitting: Cardiology

## 2022-07-06 DIAGNOSIS — I6523 Occlusion and stenosis of bilateral carotid arteries: Secondary | ICD-10-CM | POA: Insufficient documentation

## 2022-07-09 ENCOUNTER — Encounter: Payer: Self-pay | Admitting: *Deleted

## 2022-07-11 ENCOUNTER — Other Ambulatory Visit: Payer: Medicare Other

## 2022-07-12 ENCOUNTER — Ambulatory Visit
Admission: RE | Admit: 2022-07-12 | Discharge: 2022-07-12 | Disposition: A | Discharge status: Home / Self Care | Payer: Medicare Other | Source: Ambulatory Visit | Attending: Family Medicine | Admitting: Family Medicine

## 2022-07-12 DIAGNOSIS — M545 Low back pain, unspecified: Secondary | ICD-10-CM

## 2022-07-12 DIAGNOSIS — M48061 Spinal stenosis, lumbar region without neurogenic claudication: Secondary | ICD-10-CM | POA: Diagnosis not present

## 2022-07-20 ENCOUNTER — Ambulatory Visit (INDEPENDENT_AMBULATORY_CARE_PROVIDER_SITE_OTHER): Payer: Medicare Other | Admitting: *Deleted

## 2022-07-20 ENCOUNTER — Encounter: Payer: Self-pay | Admitting: Pulmonary Disease

## 2022-07-20 ENCOUNTER — Ambulatory Visit (INDEPENDENT_AMBULATORY_CARE_PROVIDER_SITE_OTHER): Payer: Medicare Other | Admitting: Pulmonary Disease

## 2022-07-20 VITALS — BP 118/64 | HR 76 | Temp 98.2°F | Ht 67.0 in | Wt 225.0 lb

## 2022-07-20 DIAGNOSIS — G4733 Obstructive sleep apnea (adult) (pediatric): Secondary | ICD-10-CM | POA: Diagnosis not present

## 2022-07-20 DIAGNOSIS — E538 Deficiency of other specified B group vitamins: Secondary | ICD-10-CM | POA: Diagnosis not present

## 2022-07-20 MED ORDER — CYANOCOBALAMIN 1000 MCG/ML IJ SOLN
1000.0000 ug | Freq: Once | INTRAMUSCULAR | Status: AC
  Administered 2022-07-20: 1000 ug via INTRAMUSCULAR

## 2022-07-20 NOTE — Progress Notes (Addendum)
Administered B12 shot left deltoid. Pt tolerated well. 

## 2022-07-20 NOTE — Patient Instructions (Signed)
X ENT referral - dr Redmond Baseman for inspire implant

## 2022-07-20 NOTE — Progress Notes (Signed)
   Subjective:    Patient ID: Erica [name], female    DOB: 06-27-1956, 66 y.o.   MRN: [id number]  HPI  66 yo never smoker for follow-up of OSA  PMH -osteoarthritis, diabetes, panic attacks turbinate reduction surgery by Dr. Redmond Baseman 02/2022   OSA diagnosed in 2011 was poorly compliant, tred CPAP again in 2017 and could not tolerate, obtained oral appliance but follow-up home study showed that OSA was not completely treated. Hence started back on autoCPAP in 04/2017 with nasal pillows  She has been unable to tolerate CPAP mask interface in spite of mask desensitization and multiple attempts  Significant tests/ events reviewed  05/2022 HST >> severe OSA, AHI 34/h, low saturation of 80%.  05/2019 CPAP titration >> 13 cm 11/2009  mild , AHI of 10/hr and desat as low as 83% - wt 240 lbs - BMI 39  PSG 09/2013 showed mild OSA- AHI 13/h    HST 01/2016 20/hr.     HST 03/2017 >> (with oral appliance ) >> mild OSa persists   CT head showed meningioma & has cluster headaches  Review of Systems     Objective:   Physical Exam        Assessment & Plan:

## 2022-07-21 DIAGNOSIS — Z1231 Encounter for screening mammogram for malignant neoplasm of breast: Secondary | ICD-10-CM | POA: Diagnosis not present

## 2022-07-21 LAB — HM MAMMOGRAPHY

## 2022-07-21 NOTE — Assessment & Plan Note (Signed)
We reviewed home sleep test that showed severe OSA.  Unfortunately oral appliance would be suboptimal.  She has been unable to tolerate CPAP in spite of using multiple mask interfaces.  Her BMI is 35 and she may qualify for hypoglossal nerve stimulation implant. We will refer her to ENT for DISE I discussed implications and benefits of this therapy.  She is willing to proceed

## 2022-07-23 ENCOUNTER — Other Ambulatory Visit: Payer: Self-pay | Admitting: Obstetrics and Gynecology

## 2022-07-23 DIAGNOSIS — E2839 Other primary ovarian failure: Secondary | ICD-10-CM

## 2022-07-28 ENCOUNTER — Other Ambulatory Visit (HOSPITAL_COMMUNITY): Payer: Self-pay | Admitting: Cardiology

## 2022-07-28 DIAGNOSIS — I773 Arterial fibromuscular dysplasia: Secondary | ICD-10-CM

## 2022-08-02 ENCOUNTER — Ambulatory Visit: Payer: Medicare Other | Admitting: Internal Medicine

## 2022-08-02 ENCOUNTER — Other Ambulatory Visit: Payer: Self-pay

## 2022-08-02 ENCOUNTER — Emergency Department (HOSPITAL_BASED_OUTPATIENT_CLINIC_OR_DEPARTMENT_OTHER)
Admission: EM | Admit: 2022-08-02 | Discharge: 2022-08-02 | Disposition: A | Discharge status: Home / Self Care | Payer: Medicare Other | Attending: Emergency Medicine | Admitting: Emergency Medicine

## 2022-08-02 ENCOUNTER — Encounter (HOSPITAL_BASED_OUTPATIENT_CLINIC_OR_DEPARTMENT_OTHER): Payer: Self-pay | Admitting: *Deleted

## 2022-08-02 DIAGNOSIS — I10 Essential (primary) hypertension: Secondary | ICD-10-CM | POA: Diagnosis not present

## 2022-08-02 DIAGNOSIS — J101 Influenza due to other identified influenza virus with other respiratory manifestations: Secondary | ICD-10-CM

## 2022-08-02 DIAGNOSIS — E119 Type 2 diabetes mellitus without complications: Secondary | ICD-10-CM | POA: Diagnosis not present

## 2022-08-02 DIAGNOSIS — R059 Cough, unspecified: Secondary | ICD-10-CM | POA: Diagnosis present

## 2022-08-02 DIAGNOSIS — Z20822 Contact with and (suspected) exposure to covid-19: Secondary | ICD-10-CM | POA: Diagnosis not present

## 2022-08-02 LAB — RESP PANEL BY RT-PCR (FLU A&B, COVID) ARPGX2
Influenza A by PCR: POSITIVE — AB
Influenza B by PCR: NEGATIVE
SARS Coronavirus 2 by RT PCR: NEGATIVE

## 2022-08-02 MED ORDER — BENZONATATE 100 MG PO CAPS: 100.0000 mg | ORAL_CAPSULE | Freq: Three times a day (TID) | ORAL | 0 refills | Status: DC

## 2022-08-02 MED ORDER — ALBUTEROL SULFATE HFA 108 (90 BASE) MCG/ACT IN AERS
2.0000 | INHALATION_SPRAY | Freq: Once | RESPIRATORY_TRACT | Status: AC
  Administered 2022-08-02: 2 via RESPIRATORY_TRACT
  Filled 2022-08-02: qty 6.7

## 2022-08-02 MED ORDER — AEROCHAMBER PLUS FLO-VU LARGE MISC
1.0000 | Freq: Once | Status: AC
  Administered 2022-08-02: 1
  Filled 2022-08-02: qty 1

## 2022-08-02 MED ORDER — ONDANSETRON 4 MG PO TBDP
4.0000 mg | ORAL_TABLET | Freq: Once | ORAL | Status: AC
  Administered 2022-08-02: 4 mg via ORAL
  Filled 2022-08-02: qty 1

## 2022-08-02 MED ORDER — ONDANSETRON 4 MG PO TBDP: 4.0000 mg | ORAL_TABLET | Freq: Three times a day (TID) | ORAL | 0 refills | Status: DC | PRN

## 2022-08-02 NOTE — Discharge Instructions (Addendum)
Please avoid contact with other people as the flu is contagious.  You may take Tylenol as needed for fever and body aches.  I am sending you home with an inhaler and some cough medication.  I will also call in a prescription for nausea.  Return if you develop worsening difficulty breathing, severe chest pain, or other sudden/severe symptoms.

## 2022-08-02 NOTE — ED Notes (Signed)
Discharge paperwork given and verbally understood. 

## 2022-08-02 NOTE — ED Triage Notes (Signed)
Pt is here for generalized body aches and fatigue and wonders if she has a virus.  Pt was on a cruise and got home last night.  Pt has had cough

## 2022-08-02 NOTE — ED Provider Notes (Signed)
Emergency Department Provider Note   I have reviewed the triage vital signs and the nursing notes.   HISTORY  Chief Complaint Illness   HPI [name] is a 66 y.o. female with past history reviewed below presents emergency department with cough, congestion, body aches.  She returned from a cruise began feeling badly 2 days ago.  Patient presents today because she thought she may have COVID.  No chest pain or specific shortness of breath.  She does feel fatigued.  She is eating and drinking but does have some nausea.    Past Medical History:  Diagnosis Date   Abnormal stress test    a. 09/2016: NST showed a small defect of mild severity present in the basal inferoseptal and mid inferoseptal location, consistent with ischemia. --> medically managed   ALLERGIC RHINITIS    Anxiety    Bursitis    DDD (degenerative disc disease), lumbar    ESI with Ramos (spring 2016)   Depression    Diabetes mellitus without complication (HCC)    Type II   Dyslipidemia    External hemorrhoids    GERD (gastroesophageal reflux disease)    Hypertension    Obesity    Osteoarthritis    Palpitations    a. prior event monitor showing sinus tachycardia, no PAF.    Panic attacks    Hx of depression   Sleep apnea    No Cpap    Review of Systems  Constitutional: Positive fever/chills Eyes: No visual changes. ENT: Positive sore throat. Cardiovascular: Denies chest pain. Respiratory: Denies shortness of breath. Positive cough and congestion.  Gastrointestinal: No abdominal pain. Positive nausea, no vomiting.  No diarrhea.  No constipation. Genitourinary: Negative for dysuria. Musculoskeletal: Negative for back pain. Skin: Negative for rash. Neurological: Negative for focal weakness or numbness.  ____________________________________________   PHYSICAL EXAM:  VITAL SIGNS: ED Triage Vitals  Enc Vitals Group     BP 08/02/22 1450 (!) 121/92     Pulse Rate 08/02/22 1450 95      Resp 08/02/22 1450 20     Temp 08/02/22 1450 (!) 100.8 F (38.2 C)     Temp Source 08/02/22 1450 Oral     SpO2 08/02/22 1450 97 %   Constitutional: Alert and oriented. Well appearing and in no acute distress. Eyes: Conjunctivae are normal.  Head: Atraumatic. Nose: Positive congestion/rhinnorhea. Mouth/Throat: Mucous membranes are moist. Neck: No stridor.  Cardiovascular: Normal rate, regular rhythm. Good peripheral circulation. Grossly normal heart sounds.   Respiratory: Normal respiratory effort.  No retractions. Lungs CTAB. Gastrointestinal: No distention.  Musculoskeletal: No gross deformities of extremities. Neurologic:  Normal speech and language. Skin:  Skin is warm, dry and intact. No rash noted.  ____________________________________________   LABS (all labs ordered are listed, but only abnormal results are displayed)  Labs Reviewed  RESP PANEL BY RT-PCR (FLU A&B, COVID) ARPGX2 - Abnormal; Notable for the following components:      Result Value   Influenza A by PCR POSITIVE (*)    All other components within normal limits   ____________________________________________   PROCEDURES  Procedure(s) performed:   Procedures  None  ____________________________________________   INITIAL IMPRESSION / ASSESSMENT AND PLAN / ED COURSE  Pertinent labs & imaging results that were available during my care of the patient were reviewed by me and considered in my medical decision making (see chart for details).   This patient is Presenting for Evaluation of flu-like symptoms, which does require a range of  treatment options, and is a complaint that involves a moderate risk of morbidity and mortality.  The Differential Diagnoses include Flu, COVID, CAP, pneumonitis, etc.  Critical Interventions-    Medications  albuterol (VENTOLIN HFA) 108 (90 Base) MCG/ACT inhaler 2 puff (has no administration in time range)  AeroChamber Plus Flo-Vu Large MISC 1 each (has no administration  in time range)  ondansetron (ZOFRAN-ODT) disintegrating tablet 4 mg (has no administration in time range)    Reassessment after intervention: Symptoms improved.   Clinical Laboratory Tests Ordered, included Flu A positive by PCR. Negative COVID test.    Medical Decision Making: Summary:  Patient presents to the emergency department for evaluation of cough, congestion, body aches.  No hypoxemia.  Arrives mildly febrile with no severe tachycardia.  She overall looks well on my assessment with clear lungs.  Considered chest x-ray but with clear lungs on exam and no hypoxemia will defer.  Patient appears clinically well-hydrated and ambulatory here.  Discussed Tamiflu and other antiviral options but she is just approaching 2 days of symptoms and considering possible side effects we decided not to give Tamiflu. Plan for symptoms mgmt and strict ED return precautions.   Disposition: discharge  ____________________________________________  FINAL CLINICAL IMPRESSION(S) / ED DIAGNOSES  Final diagnoses:  Influenza A     NEW OUTPATIENT MEDICATIONS STARTED DURING THIS VISIT:  New Prescriptions   BENZONATATE (TESSALON) 100 MG CAPSULE    Take 1 capsule (100 mg total) by mouth every 8 (eight) hours.   ONDANSETRON (ZOFRAN-ODT) 4 MG DISINTEGRATING TABLET    Take 1 tablet (4 mg total) by mouth every 8 (eight) hours as needed.    Note:  This document was prepared using Dragon voice recognition software and may include unintentional dictation errors.  Erica Quinton, MD, [facility] Emergency Medicine    Shem Plemmons, [facility], MD 08/02/22 [phone]

## 2022-08-03 ENCOUNTER — Ambulatory Visit: Payer: Medicare Other | Admitting: Internal Medicine

## 2022-09-27 DIAGNOSIS — E119 Type 2 diabetes mellitus without complications: Secondary | ICD-10-CM | POA: Diagnosis not present

## 2022-09-28 DIAGNOSIS — B3731 Acute candidiasis of vulva and vagina: Secondary | ICD-10-CM | POA: Diagnosis not present

## 2022-09-28 DIAGNOSIS — R300 Dysuria: Secondary | ICD-10-CM | POA: Diagnosis not present

## 2022-09-28 DIAGNOSIS — N6310 Unspecified lump in the right breast, unspecified quadrant: Secondary | ICD-10-CM | POA: Diagnosis not present

## 2022-10-08 ENCOUNTER — Encounter: Payer: Self-pay | Admitting: Internal Medicine

## 2022-10-08 NOTE — Progress Notes (Signed)
Outside notes received. Information abstracted. Notes sent to scan.  

## 2022-10-12 NOTE — Progress Notes (Unsigned)
[facility] [place] Phone: [phone] Subjective:   Fontaine No, am serving as a scribe for Dr. [name].  I'm seeing this patient by the request  of:  [name], MD  CC: Low back pain  OEV:[id number]  06/17/2022 Pain with left-sided radicular symptoms. Seems to be worsening. No longer responding to the epidurals. Patient does have what appears to be an L5 or S1 nerve root impingement on the left side that is worsening symptoms. Last imaging was nearly 4 years ago and will get MRI to further evaluate. Depending on findings this could change management of injections, medical or possible surgical intervention. We will follow-up after imaging.   Update 10/15/2022 LUCIENNE SAWYERS is a 67 y.o. female coming in with complaint of LBP. Patient states that her back pain has improved but she still has pain in the L hip that radiates down into the calf. Trying to work on [employer].   Hx of R TKE. Continued pain.   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 No focal progression since 2020.       Past Medical History:  Diagnosis Date   Abnormal stress test    a. 09/2016: NST showed a small defect of mild severity present in the basal inferoseptal and mid inferoseptal location, consistent with ischemia. --> medically managed   ALLERGIC RHINITIS    Anxiety    Bursitis    DDD (degenerative disc disease), lumbar    ESI with Ramos (spring 2016)   Depression    Diabetes mellitus without complication (HCC)    Type II   Dyslipidemia    External hemorrhoids    GERD (gastroesophageal reflux disease)    Hypertension    Obesity    Osteoarthritis    Palpitations    a. prior event monitor showing sinus tachycardia, no PAF.    Panic attacks    Hx of depression   Sleep apnea    No Cpap   Past Surgical History:  Procedure Laterality Date   ABDOMINAL HYSTERECTOMY      DILATION AND CURETTAGE OF UTERUS     MASS EXCISION Left 12/30/2015   Procedure: EXCISION LEFT LEG MASS;  Surgeon: Donnie Mesa, MD;  Location: [facility];  Service: General;  Laterality: Left;   NASAL TURBINATE REDUCTION Bilateral 03/04/2022   Procedure: TURBINATE REDUCTION;  Surgeon: Melida Quitter, MD;  Location: [facility] OR;  Service: ENT;  Laterality: Bilateral;   REPLACEMENT TOTAL KNEE Right    RIGHT OOPHORECTOMY     No cancer   Social History   Socioeconomic History   Marital status: Married    Spouse name: [name]   Number of children: 3   Years of education: Not on file   Highest education level: Not on file  Occupational History   Occupation: Disabled  Tobacco Use   Smoking status: Never   Smokeless tobacco: Never   Tobacco comments:    tried to as a teenager  Media planner   Vaping Use: Never used  Substance and Sexual Activity   Alcohol use: Yes    Alcohol/week: 1.0 standard drink of alcohol    Types: 1 Glasses of wine per week    Comment: one drink a month   Drug use: No   Sexual activity: Not Currently  Other Topics Concern   Not on file  Social History Narrative   Married with children. Pt is on disablity.  Previous worked in the [employer] Strain: [place]  (02/06/2021)   Overall Financial Resource Strain (CARDIA)    Difficulty of Paying Living Expenses: Not hard at all  Food Insecurity: No Food Insecurity (02/06/2021)   Hunger Vital Sign    Worried About Running Out of Food in the Last Year: Never true    Ran Out of Food in the Last Year: Never true  Transportation Needs: No Transportation Needs (02/06/2021)   PRAPARE - Hydrologist (Medical): No    Lack of Transportation (Non-Medical): No  Physical Activity: Inactive (02/06/2021)   Exercise Vital Sign    Days of Exercise per Week: 0 days    Minutes of Exercise per Session: 0 min  Stress: No Stress Concern Present  (02/06/2021)   [place]    Feeling of Stress : Not at all  Social Connections: [place] (02/06/2021)   Social Connection and Isolation Panel [NHANES]    Frequency of Communication with Friends and Family: More than three times a week    Frequency of Social Gatherings with Friends and Family: Once a week    Attends Religious Services: More than 4 times per year    Active Member of [employer] or Organizations: No    Attends Music therapist: More than 4 times per year    Marital Status: Married   Allergies  Allergen Reactions   Seroquel [Quetiapine Fumarate] Other (See Comments)    Hallucinations, palpitations   Codeine Nausea And Vomiting   Guaifenesin-Codeine Other (See Comments)     feels spacey   Wellbutrin [Bupropion] Palpitations    hallucinations   Family History  Problem Relation Age of Onset   Heart disease Mother        Enlarged Heart, Pacemaker   Diabetes Mother    Thyroid disease Mother    Hyperlipidemia Mother    Hypertension Mother    Sleep apnea Mother    Dementia Father    Kidney failure Father    Prostate cancer Father    Alcoholism Father    Asthma Other    Allergies Sister    Asthma Son    Breast cancer Maternal Aunt        cousin   Colon cancer Cousin    Heart disease Son        CHF, morbid obesity   Prostate cancer Maternal Uncle    Diabetes Son     Current Outpatient Medications (Endocrine & Metabolic):    dapagliflozin propanediol (FARXIGA) 10 MG TABS tablet, Take 1 tablet (10 mg total) by mouth daily before breakfast.   Semaglutide (RYBELSUS) 7 MG TABS, Take 7 mg by mouth daily.  Current Outpatient Medications (Cardiovascular):    atenolol (TENORMIN) 25 MG tablet, TAKE ONE TABLET BY MOUTH IN THE MORNING AND TAKE TWO TABLETS IN THE EVENING   furosemide (LASIX) 40 MG tablet, Take 1 tablet (40 mg total) by mouth daily.   propranolol (INDERAL) 10 MG tablet, Take  1 tablet (10 mg total) by mouth 3 (three) times daily as needed (PALPITATIONS).   rosuvastatin (CRESTOR) 20 MG tablet, Take 1 tablet (20 mg total) by mouth daily.  Current Outpatient Medications (Respiratory):    albuterol (VENTOLIN HFA) 108 (90 Base) MCG/ACT inhaler, Inhale 2 puffs into the lungs every 6 (six) hours as needed for wheezing or shortness of breath.   benzonatate (TESSALON) 100  MG capsule, Take 1 capsule (100 mg total) by mouth every 8 (eight) hours.   fluticasone (FLONASE) 50 MCG/ACT nasal spray, Place 2 sprays into both nostrils daily. (Patient taking differently: Place 2 sprays into both nostrils in the morning and at bedtime.)   levocetirizine (XYZAL) 5 MG tablet, Take 1 tablet (5 mg total) by mouth every evening.  Current Outpatient Medications (Analgesics):    SUMAtriptan-naproxen (TREXIMET) 85-500 MG tablet, Take 1 pill as needed for migraine, if migraine persists may repeat x1 after 2 hours  Current Outpatient Medications (Hematological):    cyanocobalamin (,VITAMIN B-12,) 1000 MCG/ML injection, Inject 1,000 mcg into the muscle every 30 (thirty) days.  Current Outpatient Medications (Other):    ALPRAZolam (XANAX) 0.5 MG tablet, Take 0.5 mg by mouth in the morning and at bedtime.   blood glucose meter kit and supplies KIT, Dispense based on patient and insurance preference. Use up to four times daily as directed. (FOR E11.9).   gabapentin (NEURONTIN) 100 MG capsule, Take 1 capsule (100 mg total) by mouth at bedtime.   hydrOXYzine (ATARAX) 25 MG tablet, Take 25 mg by mouth 3 (three) times daily as needed for itching (allergies).   linaclotide (LINZESS) 290 MCG CAPS capsule, Take 1 capsule (290 mcg total) by mouth daily before breakfast.   Multiple Vitamins-Minerals (MULTIVITAMIN WITH MINERALS) tablet, Take 1 tablet by mouth daily.   omeprazole (PRILOSEC) 20 MG capsule, Take 1 capsule (20 mg total) by mouth 2 (two) times daily as needed (take 30 minutes prior to a meal).    ondansetron (ZOFRAN-ODT) 4 MG disintegrating tablet, Take 1 tablet (4 mg total) by mouth every 8 (eight) hours as needed.   potassium chloride SA (KLOR-CON) 20 MEQ tablet, Take 1 tablet (20 mEq total) by mouth daily. 1 tablet by mouth daily (Patient taking differently: Take 20 mEq by mouth every evening.)   Tart Cherry (TART CHERRY ULTRA) 1200 MG CAPS, Take 1 capsule by mouth every evening.   tiZANidine (ZANAFLEX) 4 MG tablet, Take 1 tablet (4 mg total) by mouth at bedtime.   TURMERIC PO, Take 1 capsule by mouth daily. Vita b-12  & b-6   venlafaxine XR (EFFEXOR-XR) 150 MG 24 hr capsule, Take 1 capsule (150 mg total) by mouth 2 (two) times daily.   Vitamin D, Ergocalciferol, (DRISDOL) 1.25 MG (50000 UNIT) CAPS capsule, Take 1 capsule (50,000 Units total) by mouth every 7 (seven) days.   Reviewed prior external information including notes and imaging from  primary care provider As well as notes that were available from care everywhere and other healthcare systems.  Past medical history, social, surgical and family history all reviewed in electronic medical record.  No pertanent information unless stated regarding to the chief complaint.   Review of Systems:  No headache, visual changes, nausea, vomiting, diarrhea, constipation, dizziness, abdominal pain, skin rash, fevers, chills, night sweats, weight loss, swollen lymph nodes, body aches, joint swelling, chest pain, shortness of breath, mood changes. POSITIVE muscle aches  Objective  Blood pressure 122/82, pulse 77, height _0  (1.702 m), SpO2 99 %.   General: No apparent distress alert and oriented x3 mood and affect normal, dressed appropriately.  HEENT: Pupils equal, extraocular movements intact  Respiratory: Patient's speak in full sentences and does not appear short of breath  Cardiovascular: No lower extremity edema, non tender, no erythema  Low back does have some loss of lordosis.  Patient still has some left-sided tenderness to  palpation.  Patient is severely tender over the  greater trochanteric area on the left side.  Patient does have voluntary guarding noted.  Antalgic gait noted. Right knee does have a replacement but does have swelling noted.  Audible popping it does seem to have some instability with valgus and varus force   After verbal consent patient was prepped with alcohol swab and with a 21-gauge 2 inch needle injected into the left greater trochanteric area with 2 cc of 0.5% Marcaine and 1 cc of Kenalog 40 mg/mL.  No blood loss.  Band-Aid placed.  Postinjection instructions given    Impression and Recommendations:    The above documentation has been reviewed and is accurate and complete Lyndal Pulley, DO

## 2022-10-15 ENCOUNTER — Ambulatory Visit (INDEPENDENT_AMBULATORY_CARE_PROVIDER_SITE_OTHER): Payer: Medicare Other

## 2022-10-15 ENCOUNTER — Ambulatory Visit (INDEPENDENT_AMBULATORY_CARE_PROVIDER_SITE_OTHER): Payer: Medicare Other | Admitting: Family Medicine

## 2022-10-15 ENCOUNTER — Other Ambulatory Visit: Payer: Self-pay | Admitting: Family Medicine

## 2022-10-15 VITALS — BP 122/82 | HR 77 | Ht 67.0 in

## 2022-10-15 DIAGNOSIS — M25561 Pain in right knee: Secondary | ICD-10-CM | POA: Diagnosis not present

## 2022-10-15 DIAGNOSIS — Z96651 Presence of right artificial knee joint: Secondary | ICD-10-CM | POA: Diagnosis not present

## 2022-10-15 DIAGNOSIS — M7062 Trochanteric bursitis, left hip: Secondary | ICD-10-CM

## 2022-10-15 DIAGNOSIS — G8929 Other chronic pain: Secondary | ICD-10-CM

## 2022-10-15 DIAGNOSIS — E538 Deficiency of other specified B group vitamins: Secondary | ICD-10-CM | POA: Diagnosis not present

## 2022-10-15 DIAGNOSIS — Z471 Aftercare following joint replacement surgery: Secondary | ICD-10-CM | POA: Diagnosis not present

## 2022-10-15 NOTE — Patient Instructions (Addendum)
Good to see you  Injection in Left hip today We will call once the B12 comes in and you can get on the nurse schedule Xray on the way out for the knee Follow up in in 6 weeks just in case

## 2022-10-17 ENCOUNTER — Encounter: Payer: Self-pay | Admitting: Family Medicine

## 2022-10-17 DIAGNOSIS — M7062 Trochanteric bursitis, left hip: Secondary | ICD-10-CM | POA: Insufficient documentation

## 2022-10-17 NOTE — Assessment & Plan Note (Signed)
Patient given injection and tolerated the procedure well, patient has had intermittent difficulty with this previously.  Concern for some of this being lumbar radiculopathy.  Discussed medication such as gabapentin at great length.  Patient wants to continue with what she is on at the moment.  Discussed Cymbalta as well.  Discussed home exercises, core strength and continuing with working on weight loss as well.  Follow-up with me again in 6 to 8 weeks

## 2022-10-17 NOTE — Assessment & Plan Note (Signed)
Normally would do B12 injection as well but out of B12 so patient will have to come back at a later date

## 2022-10-17 NOTE — Assessment & Plan Note (Signed)
Patient continues to have difficulty with this.  Patient has not been complaining for it for nearly 3 years.  Does now have more of an effusion noted.  We discussed potentially doing a bone scan to further evaluate if there is any significant loosening.  We will order this for this chronic problem with exacerbation.  Starting affect daily activities.

## 2022-10-18 DIAGNOSIS — N6341 Unspecified lump in right breast, subareolar: Secondary | ICD-10-CM | POA: Diagnosis not present

## 2022-10-18 DIAGNOSIS — N6011 Diffuse cystic mastopathy of right breast: Secondary | ICD-10-CM | POA: Diagnosis not present

## 2022-10-18 DIAGNOSIS — R92321 Mammographic fibroglandular density, right breast: Secondary | ICD-10-CM | POA: Diagnosis not present

## 2022-11-01 ENCOUNTER — Other Ambulatory Visit: Payer: Self-pay | Admitting: Family Medicine

## 2022-11-02 MED ORDER — VITAMIN D (ERGOCALCIFEROL) 1.25 MG (50000 UNIT) PO CAPS: 50000.0000 [IU] | ORAL_CAPSULE | ORAL | 0 refills | Status: DC

## 2022-11-10 ENCOUNTER — Encounter: Payer: Self-pay | Admitting: Internal Medicine

## 2022-11-10 NOTE — Patient Instructions (Addendum)
    B12 injection today    Blood work was ordered.   The lab is on the first floor.    Medications changes include :   stop farxiga.  Increase rybelsus to 14 mg daily      Return in about 6 months (around 05/12/2023) for Physical Exam.

## 2022-11-10 NOTE — Progress Notes (Unsigned)
Subjective:    Patient ID: Erica [name] Alert, female    DOB: 08/18/1956, 67 y.o.   MRN: [id number]     HPI Ersa is here for follow up of her chronic medical problems, including DM, htn, hld, migraines, gerd, constipation, B12 def  B12 injection due  Has had a few yeast injections.  She was told this was related to one of her medications.  Medications and allergies reviewed with patient and updated if appropriate.  Current Outpatient Medications on File Prior to Visit  Medication Sig Dispense Refill   albuterol (VENTOLIN HFA) 108 (90 Base) MCG/ACT inhaler Inhale 2 puffs into the lungs every 6 (six) hours as needed for wheezing or shortness of breath. 16 g 3   ALPRAZolam (XANAX) 0.5 MG tablet Take 0.5 mg by mouth in the morning and at bedtime.     atenolol (TENORMIN) 25 MG tablet TAKE ONE TABLET BY MOUTH IN THE MORNING AND TAKE TWO TABLETS IN THE EVENING 90 tablet 2   clonazePAM (KLONOPIN) 0.5 MG tablet Take 0.5 mg by mouth 2 (two) times daily as needed.     cyanocobalamin (,VITAMIN B-12,) 1000 MCG/ML injection Inject 1,000 mcg into the muscle every 30 (thirty) days.     dapagliflozin propanediol (FARXIGA) 10 MG TABS tablet Take 1 tablet (10 mg total) by mouth daily before breakfast. 90 tablet 1   fluticasone (FLONASE) 50 MCG/ACT nasal spray Place 2 sprays into both nostrils daily. (Patient taking differently: Place 2 sprays into both nostrils in the morning and at bedtime.) 16 g 11   furosemide (LASIX) 40 MG tablet Take 1 tablet (40 mg total) by mouth daily. 90 tablet 2   gabapentin (NEURONTIN) 100 MG capsule Take 1 capsule (100 mg total) by mouth at bedtime. 90 capsule 0   hydrOXYzine (ATARAX) 25 MG tablet Take 25 mg by mouth 3 (three) times daily as needed for itching (allergies).     levocetirizine (XYZAL) 5 MG tablet Take 1 tablet (5 mg total) by mouth every evening. 90 tablet 2   linaclotide (LINZESS) 290 MCG CAPS capsule Take 1 capsule (290 mcg total) by mouth daily  before breakfast. 90 capsule 2   Multiple Vitamins-Minerals (MULTIVITAMIN WITH MINERALS) tablet Take 1 tablet by mouth daily.     omeprazole (PRILOSEC) 20 MG capsule Take 1 capsule (20 mg total) by mouth 2 (two) times daily as needed (take 30 minutes prior to a meal). 180 capsule 2   potassium chloride SA (KLOR-CON) 20 MEQ tablet Take 1 tablet (20 mEq total) by mouth daily. 1 tablet by mouth daily (Patient taking differently: Take 20 mEq by mouth every evening.) 90 tablet 3   propranolol (INDERAL) 10 MG tablet Take 1 tablet (10 mg total) by mouth 3 (three) times daily as needed (PALPITATIONS). 60 tablet 3   Semaglutide (RYBELSUS) 7 MG TABS Take 7 mg by mouth daily. 90 tablet 1   SUMAtriptan-naproxen (TREXIMET) 85-500 MG tablet Take 1 pill as needed for migraine, if migraine persists may repeat x1 after 2 hours 10 tablet 8   Tart Cherry (TART CHERRY ULTRA) 1200 MG CAPS Take 1 capsule by mouth every evening.     tiZANidine (ZANAFLEX) 4 MG tablet Take 1 tablet (4 mg total) by mouth at bedtime. 30 tablet 0   TURMERIC PO Take 1 capsule by mouth daily. Vita b-12  & b-6     venlafaxine XR (EFFEXOR-XR) 150 MG 24 hr capsule Take 1 capsule (150 mg total) by mouth 2 (  two) times daily. 180 capsule 2   Vitamin D, Ergocalciferol, (DRISDOL) 1.25 MG (50000 UNIT) CAPS capsule Take 1 capsule (50,000 Units total) by mouth every 7 (seven) days. 12 capsule 0   rosuvastatin (CRESTOR) 20 MG tablet Take 1 tablet (20 mg total) by mouth daily. 90 tablet 2   [DISCONTINUED] oxycodone (OXY-IR) 5 MG capsule Take 5 mg by mouth every 4 (four) hours as needed. For pain.     No current facility-administered medications on file prior to visit.     Review of Systems  Constitutional:  Negative for fever.  Respiratory:  Negative for cough, shortness of breath and wheezing.   Cardiovascular:  Positive for palpitations. Negative for chest pain and leg swelling.  Neurological:  Negative for light-headedness and headaches.   Psychiatric/Behavioral:  The patient is nervous/anxious.        Objective:   Vitals:   11/11/22 1027  BP: 118/74  Pulse: 75  Temp: 98.6 F (37 C)  SpO2: 96%   BP Readings from Last 3 Encounters:  11/11/22 118/74  10/15/22 122/82  08/02/22 (!) 131/95   Wt Readings from Last 3 Encounters:  11/11/22 220 lb (99.8 kg)  07/20/22 225 lb (102.1 kg)  06/17/22 229 lb (103.9 kg)   Body mass index is 34.46 kg/m.    Physical Exam Constitutional:      General: She is not in acute distress.    Appearance: Normal appearance.  HENT:     Head: Normocephalic and atraumatic.  Eyes:     Conjunctiva/sclera: Conjunctivae normal.  Cardiovascular:     Rate and Rhythm: Normal rate and regular rhythm.     Heart sounds: Normal heart sounds. No murmur heard. Pulmonary:     Effort: Pulmonary effort is normal. No respiratory distress.     Breath sounds: Normal breath sounds. No wheezing.  Musculoskeletal:     Cervical back: Neck supple.     Right lower leg: No edema.     Left lower leg: No edema.  Lymphadenopathy:     Cervical: No cervical adenopathy.  Skin:    General: Skin is warm and dry.     Findings: No rash.  Neurological:     Mental Status: She is alert. Mental status is at baseline.  Psychiatric:        Mood and Affect: Mood normal.        Behavior: Behavior normal.        Lab Results  Component Value Date   WBC 7.6 05/11/2022   HGB 12.4 05/11/2022   HCT 37.5 05/11/2022   PLT 296.0 05/11/2022   GLUCOSE 91 05/11/2022   CHOL 155 05/11/2022   TRIG 82.0 05/11/2022   HDL 52.40 05/11/2022   LDLCALC 86 05/11/2022   ALT 12 05/11/2022   AST 17 05/11/2022   NA 139 05/11/2022   K 3.6 05/11/2022   CL 106 05/11/2022   CREATININE 0.70 05/11/2022   BUN 11 05/11/2022   CO2 28 05/11/2022   TSH 2.26 02/02/2021   HGBA1C 6.2 05/11/2022   MICROALBUR 0.8 11/11/2021     Assessment & Plan:    See Problem List for Assessment and Plan of chronic medical problems.

## 2022-11-11 ENCOUNTER — Ambulatory Visit (INDEPENDENT_AMBULATORY_CARE_PROVIDER_SITE_OTHER): Payer: Medicare Other | Admitting: Internal Medicine

## 2022-11-11 VITALS — BP 118/74 | HR 75 | Temp 98.6°F | Ht 67.0 in | Wt 220.0 lb

## 2022-11-11 DIAGNOSIS — I10 Essential (primary) hypertension: Secondary | ICD-10-CM | POA: Diagnosis not present

## 2022-11-11 DIAGNOSIS — E538 Deficiency of other specified B group vitamins: Secondary | ICD-10-CM | POA: Diagnosis not present

## 2022-11-11 DIAGNOSIS — F331 Major depressive disorder, recurrent, moderate: Secondary | ICD-10-CM

## 2022-11-11 DIAGNOSIS — E785 Hyperlipidemia, unspecified: Secondary | ICD-10-CM

## 2022-11-11 DIAGNOSIS — J301 Allergic rhinitis due to pollen: Secondary | ICD-10-CM | POA: Diagnosis not present

## 2022-11-11 DIAGNOSIS — J069 Acute upper respiratory infection, unspecified: Secondary | ICD-10-CM

## 2022-11-11 DIAGNOSIS — K219 Gastro-esophageal reflux disease without esophagitis: Secondary | ICD-10-CM | POA: Diagnosis not present

## 2022-11-11 DIAGNOSIS — M8589 Other specified disorders of bone density and structure, multiple sites: Secondary | ICD-10-CM

## 2022-11-11 DIAGNOSIS — F410 Panic disorder [episodic paroxysmal anxiety] without agoraphobia: Secondary | ICD-10-CM

## 2022-11-11 DIAGNOSIS — K5909 Other constipation: Secondary | ICD-10-CM | POA: Diagnosis not present

## 2022-11-11 DIAGNOSIS — G43001 Migraine without aura, not intractable, with status migrainosus: Secondary | ICD-10-CM

## 2022-11-11 DIAGNOSIS — E1169 Type 2 diabetes mellitus with other specified complication: Secondary | ICD-10-CM

## 2022-11-11 LAB — COMPREHENSIVE METABOLIC PANEL
ALT: 11 U/L (ref 0–35)
AST: 16 U/L (ref 0–37)
Albumin: 4.2 g/dL (ref 3.5–5.2)
Alkaline Phosphatase: 169 U/L — ABNORMAL HIGH (ref 39–117)
BUN: 14 mg/dL (ref 6–23)
CO2: 31 mEq/L (ref 19–32)
Calcium: 10.1 mg/dL (ref 8.4–10.5)
Chloride: 100 mEq/L (ref 96–112)
Creatinine, Ser: 0.85 mg/dL (ref 0.40–1.20)
GFR: 70.13 mL/min (ref >60.00)
Glucose, Bld: 88 mg/dL (ref 70–99)
Potassium: 4 mEq/L (ref 3.5–5.1)
Sodium: 137 mEq/L (ref 135–145)
Total Bilirubin: 0.4 mg/dL (ref 0.2–1.2)
Total Protein: 7.9 g/dL (ref 6.0–8.3)

## 2022-11-11 LAB — CBC WITH DIFFERENTIAL/PLATELET
Basophils Absolute: 0 10*3/uL (ref 0.0–0.1)
Basophils Relative: 0.4 % (ref 0.0–3.0)
Eosinophils Absolute: 0 10*3/uL (ref 0.0–0.7)
Eosinophils Relative: 0.5 % (ref 0.0–5.0)
HCT: 40.3 % (ref 36.0–46.0)
Hemoglobin: 13.3 g/dL (ref 12.0–15.0)
Lymphocytes Relative: 24.6 % (ref 12.0–46.0)
Lymphs Abs: 2.1 10*3/uL (ref 0.7–4.0)
MCHC: 33 g/dL (ref 30.0–36.0)
MCV: 86.9 fl (ref 78.0–100.0)
Monocytes Absolute: 0.5 10*3/uL (ref 0.1–1.0)
Monocytes Relative: 6.3 % (ref 3.0–12.0)
Neutro Abs: 5.9 10*3/uL (ref 1.4–7.7)
Neutrophils Relative %: 68.2 % (ref 43.0–77.0)
Platelets: 303 10*3/uL (ref 150.0–400.0)
RBC: 4.64 Mil/uL (ref 3.87–5.11)
RDW: 14.7 % (ref 11.5–15.5)
WBC: 8.6 10*3/uL (ref 4.0–10.5)

## 2022-11-11 LAB — LIPID PANEL
Cholesterol: 159 mg/dL (ref 0–200)
HDL: 62.2 mg/dL (ref >39.00)
LDL Cholesterol: 81 mg/dL (ref 0–99)
NonHDL: 96.42
Total CHOL/HDL Ratio: 3
Triglycerides: 77 mg/dL (ref 0.0–149.0)
VLDL: 15.4 mg/dL (ref 0.0–40.0)

## 2022-11-11 LAB — MICROALBUMIN / CREATININE URINE RATIO
Creatinine,U: 184.3 mg/dL
Microalb Creat Ratio: 0.7 mg/g (ref 0.0–30.0)
Microalb, Ur: 1.3 mg/dL (ref 0.0–1.9)

## 2022-11-11 LAB — HEMOGLOBIN A1C: Hgb A1c MFr Bld: 6 % (ref 4.6–6.5)

## 2022-11-11 LAB — VITAMIN B12: Vitamin B-12: >1500 pg/mL — ABNORMAL HIGH (ref 211–911)

## 2022-11-11 MED ORDER — LEVOCETIRIZINE DIHYDROCHLORIDE 5 MG PO TABS: 5.0000 mg | ORAL_TABLET | Freq: Every evening | ORAL | 2 refills | Status: DC

## 2022-11-11 MED ORDER — VENLAFAXINE HCL ER 150 MG PO CP24: 150.0000 mg | ORAL_CAPSULE | Freq: Two times a day (BID) | ORAL | 2 refills | Status: AC

## 2022-11-11 MED ORDER — POTASSIUM CHLORIDE CRYS ER 20 MEQ PO TBCR: 20.0000 meq | EXTENDED_RELEASE_TABLET | Freq: Every day | ORAL | 3 refills | Status: DC

## 2022-11-11 MED ORDER — ATENOLOL 25 MG PO TABS: ORAL_TABLET | ORAL | 2 refills | Status: DC

## 2022-11-11 MED ORDER — RYBELSUS 14 MG PO TABS: 14.0000 mg | ORAL_TABLET | Freq: Every day | ORAL | 1 refills | Status: DC

## 2022-11-11 MED ORDER — CYANOCOBALAMIN 1000 MCG/ML IJ SOLN
1000.0000 ug | Freq: Once | INTRAMUSCULAR | Status: AC
  Administered 2022-11-11: 1000 ug via INTRAMUSCULAR

## 2022-11-11 MED ORDER — FLUTICASONE PROPIONATE 50 MCG/ACT NA SUSP: 2.0000 | Freq: Every day | NASAL | 11 refills | Status: DC

## 2022-11-11 MED ORDER — OMEPRAZOLE 20 MG PO CPDR: 20.0000 mg | DELAYED_RELEASE_CAPSULE | Freq: Two times a day (BID) | ORAL | 2 refills | Status: AC | PRN

## 2022-11-11 MED ORDER — LINACLOTIDE 290 MCG PO CAPS: 290.0000 ug | ORAL_CAPSULE | Freq: Every day | ORAL | 2 refills | Status: DC

## 2022-11-11 MED ORDER — FUROSEMIDE 40 MG PO TABS: 40.0000 mg | ORAL_TABLET | Freq: Every day | ORAL | 2 refills | Status: DC

## 2022-11-11 MED ORDER — ALBUTEROL SULFATE HFA 108 (90 BASE) MCG/ACT IN AERS: 2.0000 | INHALATION_SPRAY | Freq: Four times a day (QID) | RESPIRATORY_TRACT | 3 refills | Status: DC | PRN

## 2022-11-11 MED ORDER — ROSUVASTATIN CALCIUM 20 MG PO TABS: 20.0000 mg | ORAL_TABLET | Freq: Every day | ORAL | 2 refills | Status: DC

## 2022-11-11 NOTE — Assessment & Plan Note (Signed)
Chronic Regular exercise and healthy diet encouraged Check lipid panel  Continue Crestor 20 mg daily 

## 2022-11-11 NOTE — Assessment & Plan Note (Signed)
Chronic Management per psych Effexor 150 mg daily Clonazepam

## 2022-11-11 NOTE — Assessment & Plan Note (Addendum)
Chronic   Lab Results  Component Value Date   HGBA1C 6.2 05/11/2022   Sugars well controlled Check A1c, urine microalbumin today Having yeast infections - stop farxiga Increase rybelsus to 14 mg daily Stressed regular exercise, diabetic diet

## 2022-11-11 NOTE — Addendum Note (Signed)
Addended by: Marcina Millard on: 11/11/2022 03:47 PM   Modules accepted: Orders

## 2022-11-11 NOTE — Assessment & Plan Note (Signed)
Chronic GERD controlled Continue omeprazole 20 mg twice daily as needed

## 2022-11-11 NOTE — Assessment & Plan Note (Signed)
Chronic Infrequent migraines Continue Treximet as needed

## 2022-11-11 NOTE — Assessment & Plan Note (Signed)
Chronic Controlled Continue flonase

## 2022-11-11 NOTE — Assessment & Plan Note (Addendum)
Chronic Not controlled Partially related to rybelsus Continue Linzess 290 mcg daily Taking a probiotic and drinking a lot of water Add stool softener

## 2022-11-11 NOTE — Assessment & Plan Note (Signed)
Chronic Blood pressure well controlled CMP Continue atenolol 25 mg in the morning and 50 mg in the evening,

## 2022-11-11 NOTE — Assessment & Plan Note (Addendum)
Chronic Management per psychiatry Continue effexor 150 mg daily, gabapentin 100 mg HS

## 2022-11-11 NOTE — Assessment & Plan Note (Addendum)
Chronic DEXA up-to-date Encouraged regular exercise Continue multivitamin Continue vitamin D supplementation

## 2022-11-11 NOTE — Assessment & Plan Note (Signed)
Chronic Treatment with monthly B12 injections B12 injection due and will be given today

## 2022-11-30 NOTE — Progress Notes (Unsigned)
Erica [name] [address] [place] Phone: [phone] Subjective:   Erica [name], am serving as a scribe for Dr. [name].  I'm seeing this patient by the request  of:  [name], MD  CC: Multiple joint complaints  [id number]  10/15/2022 Normally would do B12 injection as well but out of B12 so patient will have to come back at a later dat   Patient continues to have difficulty with this.  Patient has not been complaining for it for nearly 3 years.  Does now have more of an effusion noted.  We discussed potentially doing a bone scan to further evaluate if there is any significant loosening.  We will order this for this chronic problem with exacerbation.  Starting affect daily activities     Patient given injection and tolerated the procedure well, patient has had intermittent difficulty with this previously.  Concern for some of this being lumbar radiculopathy.  Discussed medication such as gabapentin at great length.  Patient wants to continue with what she is on at the moment.  Discussed Cymbalta as well.  Discussed home exercises, core strength and continuing with working on weight loss as well.  Follow-up with me again in 6 to 8 weeks     Updated 12/01/2022 Erica [name] is a 67 y.o. female coming in with complaint of hip and knee pain. Left hip is hurting bad. Shooting pain into leg that wakes her at night. Recent left shoulder pain in trapezius. LBP and right knee pain as well. Patient states that everything just seems to be hurting more recently.      Past Medical History:  Diagnosis Date   Abnormal stress test    a. 09/2016: NST showed a small defect of mild severity present in the basal inferoseptal and mid inferoseptal location, consistent with ischemia. --> medically managed   ALLERGIC RHINITIS    Anxiety    Bursitis    DDD (degenerative disc disease), lumbar    ESI with Ramos (spring 2016)   Depression     Diabetes mellitus without complication (HCC)    Type II   Dyslipidemia    External hemorrhoids    GERD (gastroesophageal reflux disease)    Hypertension    Obesity    Osteoarthritis    Palpitations    a. prior event monitor showing sinus tachycardia, no PAF.    Panic attacks    Hx of depression   Sleep apnea    No Cpap   Past Surgical History:  Procedure Laterality Date   ABDOMINAL HYSTERECTOMY     DILATION AND CURETTAGE OF UTERUS     MASS EXCISION Left 12/30/2015   Procedure: EXCISION LEFT LEG MASS;  Surgeon: Donnie Mesa, MD;  Location: [facility];  Service: General;  Laterality: Left;   NASAL TURBINATE REDUCTION Bilateral 03/04/2022   Procedure: TURBINATE REDUCTION;  Surgeon: Melida Quitter, MD;  Location[facility] OR;  Service: ENT;  Laterality: Bilateral;   REPLACEMENT TOTAL KNEE Right    RIGHT OOPHORECTOMY     No cancer   Social History   Socioeconomic History   Marital status: Married    Spouse name: [name]   Number of children: 3   Years of education: Not on file   Highest education level: Not on file  Occupational History   Occupation: Disabled  Tobacco Use   Smoking status: Never   Smokeless tobacco: Never   Tobacco comments:    tried  to as a teenager  Vaping Use   Vaping Use: Never used  Substance and Sexual Activity   Alcohol use: Yes    Alcohol/week: 1.0 standard drink of alcohol    Types: 1 Glasses of wine per week    Comment: one drink a month   Drug use: No   Sexual activity: Not Currently  Other Topics Concern   Not on file  Social History Narrative   Married with children. Pt is on disablity. Previous worked in the [place] Strain: [place]  (02/06/2021)   Overall Financial Resource Strain (CARDIA)    Difficulty of Paying Living Expenses: Not hard at all  Food Insecurity: No Food Insecurity (02/06/2021)   Hunger Vital Sign    Worried About Running Out of Food in the Last  Year: Never true    Ran Out of Food in the Last Year: Never true  Transportation Needs: No Transportation Needs (02/06/2021)   PRAPARE - Hydrologist (Medical): No    Lack of Transportation (Non-Medical): No  Physical Activity: Inactive (02/06/2021)   Exercise Vital Sign    Days of Exercise per Week: 0 days    Minutes of Exercise per Session: 0 min  Stress: No Stress Concern Present (02/06/2021)   [place]    Feeling of Stress : Not at all  Social Connections: [place] (02/06/2021)   Social Connection and Isolation Panel [NHANES]    Frequency of Communication with Friends and Family: More than three times a week    Frequency of Social Gatherings with Friends and Family: Once a week    Attends Religious Services: More than 4 times per year    Active Member of [employer] or Organizations: No    Attends Music therapist: More than 4 times per year    Marital Status: Married   Allergies  Allergen Reactions   Seroquel [Quetiapine Fumarate] Other (See Comments)    Hallucinations, palpitations   Codeine Nausea And Vomiting   Guaifenesin-Codeine Other (See Comments)     feels spacey   Wellbutrin [Bupropion] Palpitations    hallucinations   Family History  Problem Relation Age of Onset   Heart disease Mother        Enlarged Heart, Pacemaker   Diabetes Mother    Thyroid disease Mother    Hyperlipidemia Mother    Hypertension Mother    Sleep apnea Mother    Dementia Father    Kidney failure Father    Prostate cancer Father    Alcoholism Father    Asthma Other    Allergies Sister    Asthma Son    Breast cancer Maternal Aunt        cousin   Colon cancer Cousin    Heart disease Son        CHF, morbid obesity   Prostate cancer Maternal Uncle    Diabetes Son     Current Outpatient Medications (Endocrine & Metabolic):    Semaglutide (RYBELSUS) 14 MG TABS, Take 1  tablet (14 mg total) by mouth daily.  Current Outpatient Medications (Cardiovascular):    atenolol (TENORMIN) 25 MG tablet, TAKE ONE TABLET BY MOUTH IN THE MORNING AND TAKE TWO TABLETS IN THE EVENING   furosemide (LASIX) 40 MG tablet, Take 1 tablet (40 mg total) by mouth daily.   propranolol (INDERAL) 10 MG tablet, Take 1 tablet (10  mg total) by mouth 3 (three) times daily as needed (PALPITATIONS).   rosuvastatin (CRESTOR) 20 MG tablet, Take 1 tablet (20 mg total) by mouth daily.  Current Outpatient Medications (Respiratory):    albuterol (VENTOLIN HFA) 108 (90 Base) MCG/ACT inhaler, Inhale 2 puffs into the lungs every 6 (six) hours as needed for wheezing or shortness of breath.   fluticasone (FLONASE) 50 MCG/ACT nasal spray, Place 2 sprays into both nostrils daily.   levocetirizine (XYZAL) 5 MG tablet, Take 1 tablet (5 mg total) by mouth every evening.  Current Outpatient Medications (Analgesics):    SUMAtriptan-naproxen (TREXIMET) 85-500 MG tablet, Take 1 pill as needed for migraine, if migraine persists may repeat x1 after 2 hours  Current Outpatient Medications (Hematological):    cyanocobalamin (,VITAMIN B-12,) 1000 MCG/ML injection, Inject 1,000 mcg into the muscle every 30 (thirty) days.  Current Outpatient Medications (Other):    cyclobenzaprine (FLEXERIL) 5 MG tablet, Take 1 tablet (5 mg total) by mouth at bedtime.   clonazePAM (KLONOPIN) 0.5 MG tablet, Take 0.5 mg by mouth 2 (two) times daily as needed.   gabapentin (NEURONTIN) 100 MG capsule, Take 1 capsule (100 mg total) by mouth at bedtime.   hydrOXYzine (ATARAX) 25 MG tablet, Take 25 mg by mouth 3 (three) times daily as needed for itching (allergies).   linaclotide (LINZESS) 290 MCG CAPS capsule, Take 1 capsule (290 mcg total) by mouth daily before breakfast.   Multiple Vitamins-Minerals (MULTIVITAMIN WITH MINERALS) tablet, Take 1 tablet by mouth daily.   omeprazole (PRILOSEC) 20 MG capsule, Take 1 capsule (20 mg total) by  mouth 2 (two) times daily as needed (take 30 minutes prior to a meal).   potassium chloride SA (KLOR-CON M) 20 MEQ tablet, Take 1 tablet (20 mEq total) by mouth daily. 1 tablet by mouth daily   Tart Cherry (TART CHERRY ULTRA) 1200 MG CAPS, Take 1 capsule by mouth every evening.   tiZANidine (ZANAFLEX) 4 MG tablet, Take 1 tablet (4 mg total) by mouth at bedtime.   TURMERIC PO, Take 1 capsule by mouth daily. Vita b-12  & b-6   venlafaxine XR (EFFEXOR-XR) 150 MG 24 hr capsule, Take 1 capsule (150 mg total) by mouth 2 (two) times daily.   Vitamin D, Ergocalciferol, (DRISDOL) 1.25 MG (50000 UNIT) CAPS capsule, Take 1 capsule (50,000 Units total) by mouth every 7 (seven) days.   Reviewed prior external information including notes and imaging from  primary care provider As well as notes that were available from care everywhere and other healthcare systems.  Past medical history, social, surgical and family history all reviewed in electronic medical record.  No pertanent information unless stated regarding to the chief complaint.   Review of Systems:  No headache, visual changes, nausea, vomiting, diarrhea, constipation, dizziness, abdominal pain, skin rash, fevers, chills, night sweats, weight loss, swollen lymph nodes,  joint swelling, chest pain, shortness of breath, mood changes. POSITIVE muscle aches, body aches  Objective  Blood pressure 118/80, pulse 76, height 5' 7"$  (1.702 m), SpO2 96 %.   General: No apparent distress alert and oriented x3 mood and affect normal, dressed appropriately.  HEENT: Pupils equal, extraocular movements intact  Respiratory: Patient's speak in full sentences and does not appear short of breath  Cardiovascular: No lower extremity edema, non tender, no erythema  Low back exam does have some loss lordosis.  Some tenderness to palpation in the paraspinal musculature.  Patient does have a positive straight leg test noted on the left side.  Significantly tightness in the  lower back with some voluntary and involuntary guarding noted. Right knee does have some tenderness to palpation over the proximal knee.  Patient does have the postsurgical changes from the replacement noted.  Possibly trace effusion noted.  Flexion and 95 degrees  Antalgic gait noted   Impression and Recommendations:    The above documentation has been reviewed and is accurate and complete Lyndal Pulley, DO

## 2022-12-01 ENCOUNTER — Ambulatory Visit (INDEPENDENT_AMBULATORY_CARE_PROVIDER_SITE_OTHER): Payer: Medicare Other | Admitting: Family Medicine

## 2022-12-01 VITALS — BP 118/80 | HR 76 | Ht 67.0 in

## 2022-12-01 DIAGNOSIS — M5136 Other intervertebral disc degeneration, lumbar region: Secondary | ICD-10-CM | POA: Diagnosis not present

## 2022-12-01 DIAGNOSIS — M545 Low back pain, unspecified: Secondary | ICD-10-CM | POA: Diagnosis not present

## 2022-12-01 DIAGNOSIS — E559 Vitamin D deficiency, unspecified: Secondary | ICD-10-CM | POA: Diagnosis not present

## 2022-12-01 DIAGNOSIS — M65261 Calcific tendinitis, right lower leg: Secondary | ICD-10-CM

## 2022-12-01 MED ORDER — METHYLPREDNISOLONE ACETATE 80 MG/ML IJ SUSP
80.0000 mg | Freq: Once | INTRAMUSCULAR | Status: AC
  Administered 2022-12-01: 80 mg via INTRAMUSCULAR

## 2022-12-01 MED ORDER — KETOROLAC TROMETHAMINE 60 MG/2ML IM SOLN
60.0000 mg | Freq: Once | INTRAMUSCULAR | Status: AC
  Administered 2022-12-01: 60 mg via INTRAMUSCULAR

## 2022-12-01 MED ORDER — CYCLOBENZAPRINE HCL 5 MG PO TABS: 5.0000 mg | ORAL_TABLET | Freq: Every day | ORAL | 1 refills | Status: DC

## 2022-12-01 NOTE — Patient Instructions (Addendum)
[facility] [phone] Call Today  When we receive your results we will contact you.  Setup shockwave appointment for R knee Flexeril 5 mg prescribed Injection today See you 4 weeks after epidural

## 2022-12-01 NOTE — Assessment & Plan Note (Signed)
Post changes of the quadricep tendon.  Encourage patient to continue with range of motion exercises but will send patient for shockwave therapy which can be beneficial.  Worsening pain in the lower back we did respond well to cyclobenzaprine previously and given medication.

## 2022-12-01 NOTE — Assessment & Plan Note (Signed)
Discussed potentially rechecking but patient will hold at this point with any other labs.

## 2022-12-01 NOTE — Assessment & Plan Note (Addendum)
No concerns or worsening symptoms at this point.  Patient did have preceding disks noted at the L4-L5 and L5-S1.  I do believe I am looking at it myself that there is about possible progression noted.  I would like to try another epidural in the area with patient not responding well to the greater trochanteric injection.  Discussed with patient continuing with the weight loss patient has been working relatively hard on.  We did discuss laboratory workup today.  Just got cellulitis.  Patient would like to hold.  Toradol and Depo-Medrol given today.  Discussed icing regimen and home exercises, follow-up with me again 6 weeks after the epidural we did prescribe cyclobenzaprine.  Patient has had difficulty with Seroquel previously but has never had any difficulty with this.  Patient understands potential side effects

## 2022-12-02 ENCOUNTER — Ambulatory Visit (INDEPENDENT_AMBULATORY_CARE_PROVIDER_SITE_OTHER): Payer: Self-pay | Admitting: Family Medicine

## 2022-12-02 DIAGNOSIS — M65261 Calcific tendinitis, right lower leg: Secondary | ICD-10-CM

## 2022-12-02 NOTE — Patient Instructions (Signed)
Thank you for coming in today.   Schedule for the next 3 weeks.

## 2022-12-02 NOTE — Progress Notes (Signed)
   [facility] [place] Phone: [phone]   Extracorporeal Shockwave Therapy Note    Patient is being treated today with ECSWT. Informed consent was obtained and patient tolerated procedure well.   Therapy performed by Lynne Leader  Condition treated: Right quadricep calcific tendonitis Treatment preset used: Rotator cuff calcific tendinitis Energy used: 120 mJ (titrate up to) Frequency used: 10 Hz Number of pulses: 2000 Treatment #1 of #4  Electronically signed by:  [facility] 2:36 PM 12/02/22

## 2022-12-07 ENCOUNTER — Ambulatory Visit (INDEPENDENT_AMBULATORY_CARE_PROVIDER_SITE_OTHER): Payer: Self-pay | Admitting: Family Medicine

## 2022-12-07 DIAGNOSIS — M65261 Calcific tendinitis, right lower leg: Secondary | ICD-10-CM

## 2022-12-07 NOTE — Progress Notes (Signed)
   [name] [place] Phone: [phone]   Extracorporeal Shockwave Therapy Note    Patient is being treated today with ECSWT. Informed consent was obtained and patient tolerated procedure well.   Therapy performed by Lynne Leader  Condition treated: Right quadricep calcific tendinitis Treatment preset used: Heel spur Energy used: 120 mJ Frequency used: 10 Hz Number of pulses: 2000 Treatment #2 of #4  Electronically signed by:  E[name] 2:19 PM 12/07/22

## 2022-12-09 ENCOUNTER — Telehealth: Payer: Self-pay | Admitting: Internal Medicine

## 2022-12-09 ENCOUNTER — Ambulatory Visit
Admission: RE | Admit: 2022-12-09 | Discharge: 2022-12-09 | Disposition: A | Discharge status: Home / Self Care | Payer: Medicare Other | Source: Ambulatory Visit | Attending: Family Medicine | Admitting: Family Medicine

## 2022-12-09 DIAGNOSIS — M545 Low back pain, unspecified: Secondary | ICD-10-CM

## 2022-12-09 DIAGNOSIS — M4727 Other spondylosis with radiculopathy, lumbosacral region: Secondary | ICD-10-CM | POA: Diagnosis not present

## 2022-12-09 MED ORDER — IOPAMIDOL (ISOVUE-M 200) INJECTION 41%
1.0000 mL | Freq: Once | INTRAMUSCULAR | Status: AC
  Administered 2022-12-09: 1 mL via EPIDURAL

## 2022-12-09 MED ORDER — METHYLPREDNISOLONE ACETATE 40 MG/ML INJ SUSP (RADIOLOG
80.0000 mg | Freq: Once | INTRAMUSCULAR | Status: AC
  Administered 2022-12-09: 80 mg via EPIDURAL

## 2022-12-09 NOTE — Discharge Instructions (Signed)

## 2022-12-09 NOTE — Telephone Encounter (Signed)
Patient called and said that her insurance needs a prior authorization on the rybelsius stating that she is a diabetic.

## 2022-12-10 NOTE — Telephone Encounter (Signed)
PA has been sent to plan.

## 2022-12-13 ENCOUNTER — Telehealth: Payer: Self-pay | Admitting: Internal Medicine

## 2022-12-13 ENCOUNTER — Other Ambulatory Visit: Payer: Self-pay

## 2022-12-13 DIAGNOSIS — E1169 Type 2 diabetes mellitus with other specified complication: Secondary | ICD-10-CM

## 2022-12-13 MED ORDER — RYBELSUS 14 MG PO TABS: 14.0000 mg | ORAL_TABLET | Freq: Every day | ORAL | 0 refills | Status: DC

## 2022-12-13 NOTE — Telephone Encounter (Signed)
Patient needs her rybelsius sent to [facility] on [address], [place]

## 2022-12-13 NOTE — Telephone Encounter (Signed)
30 day temp supply sent to [facility] on [address].  Will send in 90 day to mail order on Thursday.

## 2022-12-14 ENCOUNTER — Telehealth: Payer: Self-pay | Admitting: Internal Medicine

## 2022-12-14 ENCOUNTER — Ambulatory Visit: Payer: Medicare Other | Admitting: Family Medicine

## 2022-12-14 ENCOUNTER — Telehealth: Payer: Self-pay | Admitting: Cardiology

## 2022-12-14 NOTE — Telephone Encounter (Signed)
Patient called and said that she is unable to afford the rybelsius - she wants to know if we have any samples that we can give her.  Patient's # [phone]

## 2022-12-14 NOTE — Telephone Encounter (Signed)
Spoke with pt regarding her palpitations, she says that over the last 3 weeks they have gotten worst and are more noticeable to her. She does have new stress with issues with her sciatic nerve and pain that she is experiencing. Pt states her palpiatations are waking her up at night and then she is tired throughout the day because she isn't sleeping very well. Pt states that she is not getting up in the middle of the night to take her propranolol. Pt does feel like she is well hydrated and she only drinks on cup of coffee a day. Pt hasn't been seen in the office since January 2023. Follow up office visit scheduled for pt to be seen back in the office. Pt verbalizes understanding.

## 2022-12-14 NOTE — Telephone Encounter (Signed)
Patient c/o Palpitations:  High priority if patient c/o lightheadedness, shortness of breath, or chest pain  How long have you had palpitations/irregular HR/ Afib? Palpitations for 3wks, only happens at night  Are you having the symptoms now? no  Are you currently experiencing lightheadedness, SOB or CP? no  Do you have a history of afib (atrial fibrillation) or irregular heart rhythm? Has history of palpitations  Have you checked your BP or HR? (document readings if available): BP 115/82 HR 72  Are you experiencing any other symptoms? no

## 2022-12-15 ENCOUNTER — Other Ambulatory Visit: Payer: Self-pay

## 2022-12-15 DIAGNOSIS — E1169 Type 2 diabetes mellitus with other specified complication: Secondary | ICD-10-CM

## 2022-12-15 MED ORDER — RYBELSUS 14 MG PO TABS: 14.0000 mg | ORAL_TABLET | Freq: Every day | ORAL | 2 refills | Status: DC

## 2022-12-15 MED ORDER — METFORMIN HCL 500 MG PO TABS: 500.0000 mg | ORAL_TABLET | Freq: Two times a day (BID) | ORAL | 0 refills | Status: DC

## 2022-12-15 NOTE — Telephone Encounter (Signed)
The only options with your insurance with a lower co-pay would be metformin or glimepiride-is she willing to retry metformin?

## 2022-12-15 NOTE — Telephone Encounter (Signed)
Faxed in today. 

## 2022-12-28 DIAGNOSIS — E119 Type 2 diabetes mellitus without complications: Secondary | ICD-10-CM | POA: Diagnosis not present

## 2022-12-29 NOTE — Progress Notes (Unsigned)
Cardiology Office Note   Date:  12/30/2022   ID:  Erica, [name] 03/03/1956, MRN [id number]  PCP:  [name], MD  Cardiologist:   Minus Breeding, MD  Chief Complaint  Patient presents with   Palpitations     History of Present Illness:  Erica [name] is a 67 y.o. female who presents for follow up of palpitations.  She was in the emergency room on 7/31 for this.  It turns out there was a medication error.  She had run out of Xanax and was actually taking Seroquel.  Her EKG was unremarkable.  High-sensitivity troponin was normal.  There was not thought to be any cardiac etiology.   She was in the ED on 11/11 with dyspnea.  I reviewed these records for this visit.    CXR was normal.  BNP was normal.  She had palpitations.  She wore a monitor but only for about 27 hours two years ago.    Since I last saw her she has been waking up frequently with palpitations.  She has not had any this week but she had in the past few weeks perhaps 1:59 in the morning.  She will wake up and feel her heart racing for 5 minutes.  Her psychiatrist wonders if it could be panic.  She does not remember what she had trouble wearing the monitor a few years ago.  She says that the symptoms to stop to go away spontaneously and slowly over several minutes.  She says they do not last over an hour.  She denies any chest pressure, neck or arm discomfort.  She cannot bring these on.  She has had some mild lower extremity swelling.   Past Medical History:  Diagnosis Date   Abnormal stress test    a. 09/2016: NST showed a small defect of mild severity present in the basal inferoseptal and mid inferoseptal location, consistent with ischemia. --> medically managed   ALLERGIC RHINITIS    Anxiety    Bursitis    DDD (degenerative disc disease), lumbar    ESI with Ramos (spring 2016)   Depression    Diabetes mellitus without complication (HCC)    Type II   Dyslipidemia    External hemorrhoids    GERD  (gastroesophageal reflux disease)    Hypertension    Obesity    Osteoarthritis    Palpitations    a. prior event monitor showing sinus tachycardia, no PAF.    Panic attacks    Hx of depression   Sleep apnea    No Cpap    Past Surgical History:  Procedure Laterality Date   ABDOMINAL HYSTERECTOMY     DILATION AND CURETTAGE OF UTERUS     MASS EXCISION Left 12/30/2015   Procedure: EXCISION LEFT LEG MASS;  Surgeon: Donnie Mesa, MD;  Location: [facility];  Service: General;  Laterality: Left;   NASAL TURBINATE REDUCTION Bilateral 03/04/2022   Procedure: TURBINATE REDUCTION;  Surgeon: Melida Quitter, MD;  Location: [facility];  Service: ENT;  Laterality: Bilateral;   REPLACEMENT TOTAL KNEE Right    RIGHT OOPHORECTOMY     No cancer     Current Outpatient Medications  Medication Sig Dispense Refill   albuterol (VENTOLIN HFA) 108 (90 Base) MCG/ACT inhaler Inhale 2 puffs into the lungs every 6 (six) hours as needed for wheezing or shortness of breath. 16 g 3   atenolol (TENORMIN) 25 MG tablet TAKE ONE TABLET BY MOUTH  IN THE MORNING AND TAKE TWO TABLETS IN THE EVENING 90 tablet 2   clonazePAM (KLONOPIN) 0.5 MG tablet Take 0.5 mg by mouth 2 (two) times daily as needed.     cyanocobalamin (,VITAMIN B-12,) 1000 MCG/ML injection Inject 1,000 mcg into the muscle every 30 (thirty) days.     cyclobenzaprine (FLEXERIL) 5 MG tablet Take 1 tablet (5 mg total) by mouth at bedtime. 30 tablet 1   fluticasone (FLONASE) 50 MCG/ACT nasal spray Place 2 sprays into both nostrils daily. 16 g 11   furosemide (LASIX) 40 MG tablet Take 1 tablet (40 mg total) by mouth daily. 90 tablet 2   gabapentin (NEURONTIN) 100 MG capsule Take 1 capsule (100 mg total) by mouth at bedtime. 90 capsule 0   hydrOXYzine (ATARAX) 25 MG tablet Take 25 mg by mouth 3 (three) times daily as needed for itching (allergies).     levocetirizine (XYZAL) 5 MG tablet Take 1 tablet (5 mg total) by mouth every evening. 90 tablet 2    linaclotide (LINZESS) 290 MCG CAPS capsule Take 1 capsule (290 mcg total) by mouth daily before breakfast. 90 capsule 2   metFORMIN (GLUCOPHAGE) 500 MG tablet Take 1 tablet (500 mg total) by mouth 2 (two) times daily with a meal. 60 tablet 0   Multiple Vitamins-Minerals (MULTIVITAMIN WITH MINERALS) tablet Take 1 tablet by mouth daily.     omeprazole (PRILOSEC) 20 MG capsule Take 1 capsule (20 mg total) by mouth 2 (two) times daily as needed (take 30 minutes prior to a meal). 180 capsule 2   potassium chloride SA (KLOR-CON M) 20 MEQ tablet Take 1 tablet (20 mEq total) by mouth daily. 1 tablet by mouth daily 90 tablet 3   propranolol (INDERAL) 10 MG tablet Take 1 tablet (10 mg total) by mouth 3 (three) times daily as needed (PALPITATIONS). 60 tablet 3   rosuvastatin (CRESTOR) 20 MG tablet Take 1 tablet (20 mg total) by mouth daily. 90 tablet 2   Semaglutide (RYBELSUS) 14 MG TABS Take 1 tablet (14 mg total) by mouth daily. 90 tablet 2   SUMAtriptan-naproxen (TREXIMET) 85-500 MG tablet Take 1 pill as needed for migraine, if migraine persists may repeat x1 after 2 hours 10 tablet 8   Tart Cherry (TART CHERRY ULTRA) 1200 MG CAPS Take 1 capsule by mouth every evening.     TURMERIC PO Take 1 capsule by mouth daily. Vita b-12  & b-6     valACYclovir (VALTREX) 500 MG tablet Take 500 mg by mouth 2 (two) times daily.     venlafaxine XR (EFFEXOR-XR) 150 MG 24 hr capsule Take 1 capsule (150 mg total) by mouth 2 (two) times daily. 180 capsule 2   Vitamin D, Ergocalciferol, (DRISDOL) 1.25 MG (50000 UNIT) CAPS capsule Take 1 capsule (50,000 Units total) by mouth every 7 (seven) days. 12 capsule 0   No current facility-administered medications for this visit.    Allergies:   Seroquel [quetiapine fumarate], Codeine, Guaifenesin-codeine, and Wellbutrin [bupropion]    ROS:  Please see the history of present illness.   Otherwise, review of systems are positive for back pain, sciatica.  All other systems are reviewed  and negative.    PHYSICAL EXAM: VS:  BP 112/72   Pulse 70   Ht 5' 6.5" (1.689 m)   Wt 226 lb 9.6 oz (102.8 kg)   SpO2 96%   BMI 36.03 kg/m  , BMI Body mass index is 36.03 kg/m. GENERAL:  Well appearing NECK:  No  jugular venous distention, waveform within normal limits, carotid upstroke brisk and symmetric, no bruits, no thyromegaly LUNGS:  Clear to auscultation bilaterally CHEST:  Unremarkable HEART:  PMI not displaced or sustained,S1 and S2 within normal limits, no S3, no S4, no clicks, no rubs, no murmurs ABD:  Flat, positive bowel sounds normal in frequency in pitch, no bruits, no rebound, no guarding, no midline pulsatile mass, no hepatomegaly, no splenomegaly EXT:  2 plus pulses throughout, no edema, no cyanosis no clubbing   EKG:  EKG is  ordered today. Sinus rhythm, rate 70, axis within normal's, intervals within normal limits, poor anterior R wave progression  Recent Labs: 11/11/2022: ALT 11; BUN 14; Creatinine, Ser 0.85; Hemoglobin 13.3; Platelets 303.0; Potassium 4.0; Sodium 137    Lipid Panel    Component Value Date/Time   CHOL 159 11/11/2022 1112   CHOL 183 02/09/2018 1053   TRIG 77.0 11/11/2022 1112   TRIG 51 09/20/2006 0915   HDL 62.20 11/11/2022 1112   HDL 55 02/09/2018 1053   CHOLHDL 3 11/11/2022 1112   VLDL 15.4 11/11/2022 1112   LDLCALC 81 11/11/2022 1112   LDLCALC 86 06/02/2020 1013      Wt Readings from Last 3 Encounters:  12/30/22 226 lb 9.6 oz (102.8 kg)  11/11/22 220 lb (99.8 kg)  07/20/22 225 lb (102.1 kg)      Other studies Reviewed: Additional studies/ records that were reviewed today include: None Review of the above records demonstrates: See elsewhere   ASSESSMENT AND PLAN:   PALPITATIONS:  I am going to have her wear a 2-week monitor.  Electrolytes were unremarkable.  TSH was previously unremarkable during the workup a couple years ago.  Further management will be based on the results of the monitor.    HTN Her blood pressure  is at target.  No change in therapy.   CAROTID STENOSIS She had  40 - 59% stenosis on both sides.  However, this was a couple years ago.  Last year was less than 1 to 39%.  I will repeat an carotid Doppler in September.   SLEEP APNEA She has been unable to use CPAP.  EDEMA I do not strongly suspect heart failure.  I will check a BNP level.   Current medicines are reviewed at length with the patient today.  The patient does not have concerns regarding medicines.  The following changes have been made:   None  Labs/ tests ordered today include:    Orders Placed This Encounter  Procedures   B Nat Peptide   LONG TERM MONITOR (3-14 DAYS)   EKG 12-Lead     Disposition:   FU one year.    Signed, Minus Breeding, MD  12/30/2022 3:30 PM    [place]

## 2022-12-30 ENCOUNTER — Ambulatory Visit (INDEPENDENT_AMBULATORY_CARE_PROVIDER_SITE_OTHER): Payer: Medicare Other

## 2022-12-30 ENCOUNTER — Ambulatory Visit: Payer: Medicare Other | Attending: Cardiology | Admitting: Cardiology

## 2022-12-30 ENCOUNTER — Encounter: Payer: Self-pay | Admitting: Cardiology

## 2022-12-30 VITALS — BP 112/72 | HR 70 | Ht 66.5 in | Wt 226.6 lb

## 2022-12-30 DIAGNOSIS — E785 Hyperlipidemia, unspecified: Secondary | ICD-10-CM

## 2022-12-30 DIAGNOSIS — R002 Palpitations: Secondary | ICD-10-CM

## 2022-12-30 DIAGNOSIS — R0602 Shortness of breath: Secondary | ICD-10-CM

## 2022-12-30 DIAGNOSIS — I6529 Occlusion and stenosis of unspecified carotid artery: Secondary | ICD-10-CM

## 2022-12-30 MED ORDER — ATENOLOL 25 MG PO TABS: ORAL_TABLET | ORAL | 0 refills | Status: DC

## 2022-12-30 NOTE — Addendum Note (Signed)
Addended by: Kathyrn Lass on: 12/30/2022 04:15 PM   Modules accepted: Orders

## 2022-12-30 NOTE — Patient Instructions (Signed)
Medication Instructions:  Continue same medications *If you need a refill on your cardiac medications before your next appointment, please call your pharmacy*   Lab Work: BNP today   Testing/Procedures: 14 day Heart Monitor ( ZIO )   Follow-Up: At [facility], you and your health needs are our priority.  As part of our continuing mission to provide you with exceptional heart care, we have created designated Provider Care Teams.  These Care Teams include your primary Cardiologist (physician) and Advanced Practice Providers (APPs -  Physician Assistants and Nurse Practitioners) who all work together to provide you with the care you need, when you need it.  We recommend signing up for the patient portal called "MyChart".  Sign up information is provided on this After Visit Summary.  MyChart is used to connect with patients for Virtual Visits (Telemedicine).  Patients are able to view lab/test results, encounter notes, upcoming appointments, etc.  Non-urgent messages can be sent to your provider as well.   To learn more about what you can do with MyChart, go to [email or web address].    Your next appointment:  1 year    Call in Dec to schedule March appointment     Provider:  Dr.Hochrein

## 2022-12-30 NOTE — Progress Notes (Unsigned)
Enrolled patient for a 14 day Zio XT  monitor to be mailed to patients home  °

## 2022-12-31 LAB — BRAIN NATRIURETIC PEPTIDE: BNP: 27.3 pg/mL (ref 0.0–100.0)

## 2023-01-06 ENCOUNTER — Other Ambulatory Visit: Payer: Self-pay | Admitting: Internal Medicine

## 2023-01-11 ENCOUNTER — Telehealth: Payer: Self-pay | Admitting: Cardiology

## 2023-01-11 NOTE — Telephone Encounter (Signed)
Appointment to have monitor applied at [facility] office made.

## 2023-01-11 NOTE — Telephone Encounter (Signed)
Patient is requesting to speak with monitor coordinator to schedule an appointment to have monitor placed.

## 2023-01-12 ENCOUNTER — Ambulatory Visit: Payer: Medicare Other | Attending: Cardiology

## 2023-01-12 DIAGNOSIS — E785 Hyperlipidemia, unspecified: Secondary | ICD-10-CM

## 2023-01-12 DIAGNOSIS — I6529 Occlusion and stenosis of unspecified carotid artery: Secondary | ICD-10-CM | POA: Diagnosis not present

## 2023-01-12 DIAGNOSIS — R0602 Shortness of breath: Secondary | ICD-10-CM | POA: Diagnosis not present

## 2023-01-12 DIAGNOSIS — R002 Palpitations: Secondary | ICD-10-CM | POA: Diagnosis not present

## 2023-01-15 DIAGNOSIS — E119 Type 2 diabetes mellitus without complications: Secondary | ICD-10-CM | POA: Diagnosis not present

## 2023-01-18 ENCOUNTER — Other Ambulatory Visit: Payer: Self-pay | Admitting: Internal Medicine

## 2023-01-19 ENCOUNTER — Other Ambulatory Visit: Payer: Self-pay

## 2023-01-24 ENCOUNTER — Encounter: Payer: Self-pay | Admitting: *Deleted

## 2023-02-01 DIAGNOSIS — R002 Palpitations: Secondary | ICD-10-CM | POA: Diagnosis not present

## 2023-02-01 DIAGNOSIS — R0602 Shortness of breath: Secondary | ICD-10-CM | POA: Diagnosis not present

## 2023-02-02 ENCOUNTER — Other Ambulatory Visit: Payer: Self-pay | Admitting: Internal Medicine

## 2023-02-10 ENCOUNTER — Encounter: Payer: Self-pay | Admitting: *Deleted

## 2023-03-03 ENCOUNTER — Ambulatory Visit (INDEPENDENT_AMBULATORY_CARE_PROVIDER_SITE_OTHER): Payer: Medicare Other | Admitting: Family Medicine

## 2023-03-03 ENCOUNTER — Ambulatory Visit (INDEPENDENT_AMBULATORY_CARE_PROVIDER_SITE_OTHER): Payer: Medicare Other

## 2023-03-03 VITALS — BP 110/70 | HR 84 | Ht 66.5 in | Wt 226.0 lb

## 2023-03-03 DIAGNOSIS — M5416 Radiculopathy, lumbar region: Secondary | ICD-10-CM | POA: Diagnosis not present

## 2023-03-03 DIAGNOSIS — E538 Deficiency of other specified B group vitamins: Secondary | ICD-10-CM | POA: Diagnosis not present

## 2023-03-03 DIAGNOSIS — M542 Cervicalgia: Secondary | ICD-10-CM

## 2023-03-03 DIAGNOSIS — M5432 Sciatica, left side: Secondary | ICD-10-CM

## 2023-03-03 DIAGNOSIS — M25561 Pain in right knee: Secondary | ICD-10-CM

## 2023-03-03 DIAGNOSIS — M47812 Spondylosis without myelopathy or radiculopathy, cervical region: Secondary | ICD-10-CM | POA: Diagnosis not present

## 2023-03-03 DIAGNOSIS — Z471 Aftercare following joint replacement surgery: Secondary | ICD-10-CM | POA: Diagnosis not present

## 2023-03-03 MED ORDER — VITAMIN D (ERGOCALCIFEROL) 1.25 MG (50000 UNIT) PO CAPS: 50000.0000 [IU] | ORAL_CAPSULE | ORAL | 0 refills | Status: DC

## 2023-03-03 MED ORDER — CYANOCOBALAMIN 1000 MCG/ML IJ SOLN
1000.0000 ug | Freq: Once | INTRAMUSCULAR | Status: AC
Start: 2023-03-03 — End: 2023-03-03
  Administered 2023-03-03: 1000 ug via INTRAMUSCULAR

## 2023-03-03 NOTE — Patient Instructions (Signed)
Trigger point injections B12 injection See you again in 3 months

## 2023-03-03 NOTE — Progress Notes (Signed)
[name] [address] [place] [address] Phone: [phone] Subjective:   Bruce Donath, am serving as a scribe for Dr. Antoine Primas.  I'm seeing this patient by the request  of:  Pincus Sanes, MD  CC: back and leg pain   [id number]  12/01/2022 No concerns or worsening symptoms at this point.  Patient did have preceding disks noted at the L4-L5 and L5-S1.  I do believe I am looking at it myself that there is about possible progression noted.  I would like to try another epidural in the area with patient not responding well to the greater trochanteric injection.  Discussed with patient continuing with the weight loss patient has been working relatively hard on.  We did discuss laboratory workup today.  Just got cellulitis.  Patient would like to hold.  Toradol and Depo-Medrol given today.  Discussed icing regimen and home exercises, follow-up with me again 6 weeks after the epidural we did prescribe cyclobenzaprine.  Patient has had difficulty with Seroquel previously but has never had any difficulty with this.  Patient understands potential side effects   Updated 03/03/2023 Erica [name] is a 67 y.o. female coming in with complaint of back pain. Patient states that epidural on 12/09/2022 has worn off. Pain with straight leg raise on L side. Constant soreness in L calf.   Also c/o pain in L side of cervical that radiates into her shoulder.   Needs refill on Vit D.       Past Medical History:  Diagnosis Date   Abnormal stress test    a. 09/2016: NST showed a small defect of mild severity present in the basal inferoseptal and mid inferoseptal location, consistent with ischemia. --> medically managed   ALLERGIC RHINITIS    Anxiety    Bursitis    DDD (degenerative disc disease), lumbar    ESI with Ramos (spring 2016)   Depression    Diabetes mellitus without complication (HCC)    Type II   Dyslipidemia    External hemorrhoids     GERD (gastroesophageal reflux disease)    Hypertension    Obesity    Osteoarthritis    Palpitations    a. prior event monitor showing sinus tachycardia, no PAF.    Panic attacks    Hx of depression   Sleep apnea    No Cpap   Past Surgical History:  Procedure Laterality Date   ABDOMINAL HYSTERECTOMY     DILATION AND CURETTAGE OF UTERUS     MASS EXCISION Left 12/30/2015   Procedure: EXCISION LEFT LEG MASS;  Surgeon: Manus Rudd, MD;  Location: [facility];  Service: General;  Laterality: Left;   NASAL TURBINATE REDUCTION Bilateral 03/04/2022   Procedure: TURBINATE REDUCTION;  Surgeon: Christia Reading, MD;  Location: [facility] OR;  Service: ENT;  Laterality: Bilateral;   REPLACEMENT TOTAL KNEE Right    RIGHT OOPHORECTOMY     No cancer   Social History   Socioeconomic History   Marital status: Married    Spouse name: Hessie Knows   Number of children: 3   Years of education: Not on file   Highest education level: Not on file  Occupational History   Occupation: Disabled  Tobacco Use   Smoking status: Never   Smokeless tobacco: Never   Tobacco comments:    tried to as a teenager  Advertising account planner   Vaping Use: Never used  Substance and Sexual Activity   Alcohol use:  Yes    Alcohol/week: 1.0 standard drink of alcohol    Types: 1 Glasses of wine per week    Comment: one drink a month   Drug use: No   Sexual activity: Not Currently  Other Topics Concern   Not on file  Social History Narrative   Married with children. Pt is on disablity. Previous worked in the school system   Social Determinants of Corporate investment banker Strain: Low Risk  (02/06/2021)   Overall Financial Resource Strain (CARDIA)    Difficulty of Paying Living Expenses: Not hard at all  Food Insecurity: No Food Insecurity (02/06/2021)   Hunger Vital Sign    Worried About Running Out of Food in the Last Year: Never true    Ran Out of Food in the Last Year: Never true  Transportation Needs: No  Transportation Needs (02/06/2021)   PRAPARE - Administrator, Civil Service (Medical): No    Lack of Transportation (Non-Medical): No  Physical Activity: Inactive (02/06/2021)   Exercise Vital Sign    Days of Exercise per Week: 0 days    Minutes of Exercise per Session: 0 min  Stress: No Stress Concern Present (02/06/2021)   [employer] of Occupational Health - Occupational Stress Questionnaire    Feeling of Stress : Not at all  Social Connections: Socially Integrated (02/06/2021)   Social Connection and Isolation Panel [NHANES]    Frequency of Communication with Friends and Family: More than three times a week    Frequency of Social Gatherings with Friends and Family: Once a week    Attends Religious Services: More than 4 times per year    Active Member of [employer] or Organizations: No    Attends Engineer, structural: More than 4 times per year    Marital Status: Married   Allergies  Allergen Reactions   Seroquel [Quetiapine Fumarate] Other (See Comments)    Hallucinations, palpitations   Codeine Nausea And Vomiting   Guaifenesin-Codeine Other (See Comments)     feels spacey   Wellbutrin [Bupropion] Palpitations    hallucinations   Family History  Problem Relation Age of Onset   Heart disease Mother        Enlarged Heart, Pacemaker   Diabetes Mother    Thyroid disease Mother    Hyperlipidemia Mother    Hypertension Mother    Sleep apnea Mother    Dementia Father    Kidney failure Father    Prostate cancer Father    Alcoholism Father    Asthma Other    Allergies Sister    Asthma Son    Breast cancer Maternal Aunt        cousin   Colon cancer Cousin    Heart disease Son        CHF, morbid obesity   Prostate cancer Maternal Uncle    Diabetes Son     Current Outpatient Medications (Endocrine & Metabolic):    metFORMIN (GLUCOPHAGE) 500 MG tablet, TAKE 1 TABLET BY MOUTH TWICE A DAY WITH FOOD   Semaglutide (RYBELSUS) 14 MG TABS, Take 1 tablet (14  mg total) by mouth daily.  Current Outpatient Medications (Cardiovascular):    atenolol (TENORMIN) 25 MG tablet, TAKE ONE TABLET BY MOUTH IN THE MORNING AND TAKE TWO TABLETS IN THE EVENING   furosemide (LASIX) 40 MG tablet, Take 1 tablet (40 mg total) by mouth daily.   propranolol (INDERAL) 10 MG tablet, Take 1 tablet (10 mg total) by mouth 3 (  three) times daily as needed (PALPITATIONS).   rosuvastatin (CRESTOR) 20 MG tablet, Take 1 tablet (20 mg total) by mouth daily.  Current Outpatient Medications (Respiratory):    albuterol (VENTOLIN HFA) 108 (90 Base) MCG/ACT inhaler, Inhale 2 puffs into the lungs every 6 (six) hours as needed for wheezing or shortness of breath.   fluticasone (FLONASE) 50 MCG/ACT nasal spray, Place 2 sprays into both nostrils daily.   levocetirizine (XYZAL) 5 MG tablet, Take 1 tablet (5 mg total) by mouth every evening.  Current Outpatient Medications (Analgesics):    SUMAtriptan-naproxen (TREXIMET) 85-500 MG tablet, Take 1 pill as needed for migraine, if migraine persists may repeat x1 after 2 hours  Current Outpatient Medications (Hematological):    cyanocobalamin (,VITAMIN B-12,) 1000 MCG/ML injection, Inject 1,000 mcg into the muscle every 30 (thirty) days.  Current Outpatient Medications (Other):    clonazePAM (KLONOPIN) 0.5 MG tablet, Take 0.5 mg by mouth 2 (two) times daily as needed.   cyclobenzaprine (FLEXERIL) 5 MG tablet, Take 1 tablet (5 mg total) by mouth at bedtime.   gabapentin (NEURONTIN) 100 MG capsule, Take 1 capsule (100 mg total) by mouth at bedtime.   hydrOXYzine (ATARAX) 25 MG tablet, Take 25 mg by mouth 3 (three) times daily as needed for itching (allergies).   linaclotide (LINZESS) 290 MCG CAPS capsule, Take 1 capsule (290 mcg total) by mouth daily before breakfast.   Multiple Vitamins-Minerals (MULTIVITAMIN WITH MINERALS) tablet, Take 1 tablet by mouth daily.   omeprazole (PRILOSEC) 20 MG capsule, Take 1 capsule (20 mg total) by mouth 2 (two)  times daily as needed (take 30 minutes prior to a meal).   potassium chloride SA (KLOR-CON M) 20 MEQ tablet, Take 1 tablet (20 mEq total) by mouth daily. 1 tablet by mouth daily   Tart Cherry (TART CHERRY ULTRA) 1200 MG CAPS, Take 1 capsule by mouth every evening.   TURMERIC PO, Take 1 capsule by mouth daily. Vita b-12  & b-6   valACYclovir (VALTREX) 500 MG tablet, Take 500 mg by mouth 2 (two) times daily.   venlafaxine XR (EFFEXOR-XR) 150 MG 24 hr capsule, Take 1 capsule (150 mg total) by mouth 2 (two) times daily.   Vitamin D, Ergocalciferol, (DRISDOL) 1.25 MG (50000 UNIT) CAPS capsule, Take 1 capsule (50,000 Units total) by mouth every 7 (seven) days.   Reviewed prior external information including notes and imaging from  primary care provider As well as notes that were available from care everywhere and other healthcare systems.  Past medical history, social, surgical and family history all reviewed in electronic medical record.  No pertanent information unless stated regarding to the chief complaint.   Review of Systems:  No headache, visual changes, nausea, vomiting, diarrhea, constipation, dizziness, abdominal pain, skin rash, fevers, chills, night sweats, weight loss, swollen lymph nodes, body aches, joint swelling, chest pain, shortness of breath, mood changes. POSITIVE muscle aches  Objective  Blood pressure 110/70, pulse 84, height 5' 6.5" (1.689 m), weight 226 lb (102.5 kg), SpO2 98 %.   General: No apparent distress alert and oriented x3 mood and affect normal, dressed appropriately.  HEENT: Pupils equal, extraocular movements intact  Respiratory: Patient's speak in full sentences and does not appear short of breath  Cardiovascular: No lower extremity edema, non tender, no erythema  Patient does have a mild antalgic gait noted.  Patient's left leg mildly tender to palpation.  Worsening pain noted with straight leg test noted on the left side.  Unable to do [place]  secondary to  body habitus. Patient does have a large spasm on the left side   Impression and Recommendations:     The above documentation has been reviewed and is accurate and complete Judi Saa, DO

## 2023-03-03 NOTE — Assessment & Plan Note (Signed)
Continues to have the sciatica of the left leg.  Discussed with patient and left leg pain.  Patient states that every time she does get the injection the leg pain does go away for quite some time.  This time though it only lasted approximately 2 months.  Starting to have worsening pain again.  We did discuss different treatment options with patient's daughter there as well but has elected to try the injection again at this time.  Follow-up with me again 6 to 8 weeks

## 2023-03-16 ENCOUNTER — Inpatient Hospital Stay
Admission: RE | Admit: 2023-03-16 | Discharge: 2023-03-16 | Disposition: A | Discharge status: Home / Self Care | Payer: Medicare Other | Source: Ambulatory Visit | Attending: Family Medicine | Admitting: Family Medicine

## 2023-03-16 NOTE — Discharge Instructions (Signed)

## 2023-03-22 ENCOUNTER — Ambulatory Visit
Admission: RE | Admit: 2023-03-22 | Discharge: 2023-03-22 | Disposition: A | Discharge status: Home / Self Care | Payer: Medicare Other | Source: Ambulatory Visit | Attending: Family Medicine | Admitting: Family Medicine

## 2023-03-22 DIAGNOSIS — M5416 Radiculopathy, lumbar region: Secondary | ICD-10-CM

## 2023-03-22 DIAGNOSIS — M4727 Other spondylosis with radiculopathy, lumbosacral region: Secondary | ICD-10-CM | POA: Diagnosis not present

## 2023-03-22 MED ORDER — IOPAMIDOL (ISOVUE-M 200) INJECTION 41%
1.0000 mL | Freq: Once | INTRAMUSCULAR | Status: AC
  Administered 2023-03-22: 1 mL via EPIDURAL

## 2023-03-22 MED ORDER — METHYLPREDNISOLONE ACETATE 40 MG/ML INJ SUSP (RADIOLOG
80.0000 mg | Freq: Once | INTRAMUSCULAR | Status: AC
  Administered 2023-03-22: 80 mg via EPIDURAL

## 2023-03-22 NOTE — Discharge Instructions (Signed)

## 2023-04-12 ENCOUNTER — Other Ambulatory Visit: Payer: Self-pay | Admitting: Internal Medicine

## 2023-04-12 ENCOUNTER — Other Ambulatory Visit: Payer: Self-pay

## 2023-04-12 ENCOUNTER — Telehealth: Payer: Self-pay | Admitting: Internal Medicine

## 2023-04-12 NOTE — Telephone Encounter (Signed)
Prescription Request  04/12/2023  LOV: 11/11/2022  What is the name of the medication or equipment? atenolol (TENORMIN) 25 MG tablet  Pt stated that Dr. Lawerance Bach refills this medication.  I notice it was a different doctor that prescribed this medication.  Have you contacted your pharmacy to request a refill? No   Which pharmacy would you like this sent to?     [facility] - [place], [place] - [address] [facility] [address] [place] [address] [place] [address] Phone: [phone] Fax: [phone]  [facility] [address] [place] - [address] [address] Phone: [phone] Fax: [phone] Patient notified that their request is being sent to the clinical staff for review and that they should receive a response within 2 business days.   Please advise at Mobile [phone] (mobile)

## 2023-04-13 MED ORDER — ATENOLOL 25 MG PO TABS: ORAL_TABLET | ORAL | 1 refills | Status: DC

## 2023-04-13 NOTE — Telephone Encounter (Signed)
Rx sent 

## 2023-04-13 NOTE — Telephone Encounter (Signed)
Per chart last refill by cardiologist pls advise if ok to fill.Marland KitchenRaechel Chute

## 2023-05-11 NOTE — Patient Instructions (Addendum)
      Blood work was ordered.   The lab is on the first floor.    Medications changes include :   none      Return in about 6 months (around 11/12/2023) for Physical Exam.

## 2023-05-11 NOTE — Progress Notes (Unsigned)
Subjective:    Patient ID: Erica [name], female    DOB: [birth year], 67 y.o.   MRN: [id number]     HPI Navah is here for follow up of her chronic medical problems.  Not sleeping well.  Feels tired.   Sinus symptoms - taking xyzal and flonase.  She has had symptoms for a week or so.  States nasal congestion, intermittent left ear pain, postnasal drip, sinus Pressure, sore throat, watering of her right eye and cough related to the drainage  Medications and allergies reviewed with patient and updated if appropriate.  Current Outpatient Medications on File Prior to Visit  Medication Sig Dispense Refill   albuterol (VENTOLIN HFA) 108 (90 Base) MCG/ACT inhaler Inhale 2 puffs into the lungs every 6 (six) hours as needed for wheezing or shortness of breath. 16 g 3   atenolol (TENORMIN) 25 MG tablet TAKE ONE TABLET BY MOUTH IN THE MORNING AND TAKE TWO TABLETS IN THE EVENING 270 tablet 1   clonazePAM (KLONOPIN) 0.5 MG tablet Take 0.5 mg by mouth 2 (two) times daily as needed.     cyanocobalamin (,VITAMIN B-12,) 1000 MCG/ML injection Inject 1,000 mcg into the muscle every 30 (thirty) days.     cyclobenzaprine (FLEXERIL) 5 MG tablet Take 1 tablet (5 mg total) by mouth at bedtime. 30 tablet 1   fluticasone (FLONASE) 50 MCG/ACT nasal spray Place 2 sprays into both nostrils daily. 16 g 11   furosemide (LASIX) 40 MG tablet Take 1 tablet (40 mg total) by mouth daily. 90 tablet 2   gabapentin (NEURONTIN) 100 MG capsule Take 1 capsule (100 mg total) by mouth at bedtime. 90 capsule 0   hydrOXYzine (ATARAX) 25 MG tablet Take 25 mg by mouth 3 (three) times daily as needed for itching (allergies).     levocetirizine (XYZAL) 5 MG tablet Take 1 tablet (5 mg total) by mouth every evening. 90 tablet 2   linaclotide (LINZESS) 290 MCG CAPS capsule Take 1 capsule (290 mcg total) by mouth daily before breakfast. 90 capsule 2   Multiple Vitamins-Minerals (MULTIVITAMIN WITH MINERALS) tablet Take 1 tablet  by mouth daily.     omeprazole (PRILOSEC) 20 MG capsule Take 1 capsule (20 mg total) by mouth 2 (two) times daily as needed (take 30 minutes prior to a meal). 180 capsule 2   potassium chloride SA (KLOR-CON M) 20 MEQ tablet Take 1 tablet (20 mEq total) by mouth daily. 1 tablet by mouth daily 90 tablet 3   propranolol (INDERAL) 10 MG tablet Take 1 tablet (10 mg total) by mouth 3 (three) times daily as needed (PALPITATIONS). 60 tablet 3   Semaglutide (RYBELSUS) 14 MG TABS Take 1 tablet (14 mg total) by mouth daily. 90 tablet 2   SUMAtriptan-naproxen (TREXIMET) 85-500 MG tablet Take 1 pill as needed for migraine, if migraine persists may repeat x1 after 2 hours 10 tablet 8   Tart Cherry (TART CHERRY ULTRA) 1200 MG CAPS Take 1 capsule by mouth every evening.     TURMERIC PO Take 1 capsule by mouth daily. Vita b-12  & b-6     valACYclovir (VALTREX) 500 MG tablet Take 500 mg by mouth 2 (two) times daily.     venlafaxine XR (EFFEXOR-XR) 150 MG 24 hr capsule Take 1 capsule (150 mg total) by mouth 2 (two) times daily. 180 capsule 2   Vitamin D, Ergocalciferol, (DRISDOL) 1.25 MG (50000 UNIT) CAPS capsule Take 1 capsule (50,000 Units total) by mouth every  7 (seven) days. 12 capsule 0   rosuvastatin (CRESTOR) 20 MG tablet Take 1 tablet (20 mg total) by mouth daily. 90 tablet 2   [DISCONTINUED] oxycodone (OXY-IR) 5 MG capsule Take 5 mg by mouth every 4 (four) hours as needed. For pain.     No current facility-administered medications on file prior to visit.     Review of Systems  Constitutional:  Negative for fever.  HENT:  Positive for congestion, ear pain (left ear), postnasal drip, sinus pressure (improved) and sore throat.   Eyes:  Positive for discharge (right eye watering).  Respiratory:  Positive for cough (dry from PND - improved). Negative for shortness of breath and wheezing.   Cardiovascular:  Positive for palpitations. Negative for chest pain and leg swelling.  Neurological:  Positive for  light-headedness (at times - ? related to heat) and headaches (right side of head). Negative for dizziness.       Objective:   Vitals:   05/12/23 1017  BP: 110/72  Pulse: 75  Temp: 98 F (36.7 C)  SpO2: 98%   BP Readings from Last 3 Encounters:  05/12/23 110/72  03/22/23 129/79  03/03/23 110/70   Wt Readings from Last 3 Encounters:  05/12/23 227 lb (103 kg)  03/03/23 226 lb (102.5 kg)  12/30/22 226 lb 9.6 oz (102.8 kg)   Body mass index is 36.09 kg/m.    Physical Exam Constitutional:      General: She is not in acute distress.    Appearance: Normal appearance. She is not ill-appearing.  HENT:     Head: Normocephalic and atraumatic.     Right Ear: Tympanic membrane, ear canal and external ear normal.     Left Ear: Tympanic membrane, ear canal and external ear normal.     Mouth/Throat:     Mouth: Mucous membranes are moist.     Pharynx: No oropharyngeal exudate or posterior oropharyngeal erythema.  Eyes:     Conjunctiva/sclera: Conjunctivae normal.  Cardiovascular:     Rate and Rhythm: Normal rate and regular rhythm.     Heart sounds: Normal heart sounds.  Pulmonary:     Effort: Pulmonary effort is normal. No respiratory distress.     Breath sounds: Normal breath sounds. No wheezing or rales.  Musculoskeletal:     Cervical back: Neck supple. No tenderness.     Right lower leg: No edema.     Left lower leg: No edema.  Lymphadenopathy:     Cervical: No cervical adenopathy.  Skin:    General: Skin is warm and dry.     Findings: No rash.  Neurological:     Mental Status: She is alert. Mental status is at baseline.  Psychiatric:        Mood and Affect: Mood normal.        Behavior: Behavior normal.        Lab Results  Component Value Date   WBC 8.6 11/11/2022   HGB 13.3 11/11/2022   HCT 40.3 11/11/2022   PLT 303.0 11/11/2022   GLUCOSE 88 11/11/2022   CHOL 159 11/11/2022   TRIG 77.0 11/11/2022   HDL 62.20 11/11/2022   LDLCALC 81 11/11/2022   ALT 11  11/11/2022   AST 16 11/11/2022   NA 137 11/11/2022   K 4.0 11/11/2022   CL 100 11/11/2022   CREATININE 0.85 11/11/2022   BUN 14 11/11/2022   CO2 31 11/11/2022   TSH 2.26 02/02/2021   HGBA1C 6.0 11/11/2022   MICROALBUR 1.3 11/11/2022  Assessment & Plan:    See Problem List for Assessment and Plan of chronic medical problems.

## 2023-05-12 ENCOUNTER — Encounter: Payer: Self-pay | Admitting: Internal Medicine

## 2023-05-12 ENCOUNTER — Ambulatory Visit (INDEPENDENT_AMBULATORY_CARE_PROVIDER_SITE_OTHER): Payer: Medicare Other | Admitting: Internal Medicine

## 2023-05-12 VITALS — BP 110/72 | HR 75 | Temp 98.0°F | Ht 66.5 in | Wt 227.0 lb

## 2023-05-12 DIAGNOSIS — Z7984 Long term (current) use of oral hypoglycemic drugs: Secondary | ICD-10-CM | POA: Diagnosis not present

## 2023-05-12 DIAGNOSIS — K5909 Other constipation: Secondary | ICD-10-CM

## 2023-05-12 DIAGNOSIS — I10 Essential (primary) hypertension: Secondary | ICD-10-CM

## 2023-05-12 DIAGNOSIS — E538 Deficiency of other specified B group vitamins: Secondary | ICD-10-CM | POA: Diagnosis not present

## 2023-05-12 DIAGNOSIS — G43001 Migraine without aura, not intractable, with status migrainosus: Secondary | ICD-10-CM | POA: Diagnosis not present

## 2023-05-12 DIAGNOSIS — K219 Gastro-esophageal reflux disease without esophagitis: Secondary | ICD-10-CM

## 2023-05-12 DIAGNOSIS — J019 Acute sinusitis, unspecified: Secondary | ICD-10-CM | POA: Diagnosis not present

## 2023-05-12 DIAGNOSIS — E785 Hyperlipidemia, unspecified: Secondary | ICD-10-CM | POA: Diagnosis not present

## 2023-05-12 DIAGNOSIS — E1169 Type 2 diabetes mellitus with other specified complication: Secondary | ICD-10-CM

## 2023-05-12 LAB — CBC WITH DIFFERENTIAL/PLATELET
Basophils Absolute: 0 10*3/uL (ref 0.0–0.1)
Basophils Relative: 0.6 % (ref 0.0–3.0)
Eosinophils Absolute: 0.1 10*3/uL (ref 0.0–0.7)
Eosinophils Relative: 1.4 % (ref 0.0–5.0)
HCT: 38.5 % (ref 36.0–46.0)
Hemoglobin: 12.4 g/dL (ref 12.0–15.0)
Lymphocytes Relative: 24.8 % (ref 12.0–46.0)
Lymphs Abs: 1.8 10*3/uL (ref 0.7–4.0)
MCHC: 32.1 g/dL (ref 30.0–36.0)
MCV: 88.1 fl (ref 78.0–100.0)
Monocytes Absolute: 0.5 10*3/uL (ref 0.1–1.0)
Monocytes Relative: 7.1 % (ref 3.0–12.0)
Neutro Abs: 4.8 10*3/uL (ref 1.4–7.7)
Neutrophils Relative %: 66.1 % (ref 43.0–77.0)
Platelets: 293 10*3/uL (ref 150.0–400.0)
RBC: 4.37 Mil/uL (ref 3.87–5.11)
RDW: 14.2 % (ref 11.5–15.5)
WBC: 7.3 10*3/uL (ref 4.0–10.5)

## 2023-05-12 LAB — COMPREHENSIVE METABOLIC PANEL
ALT: 9 U/L (ref 0–35)
AST: 13 U/L (ref 0–37)
Albumin: 3.8 g/dL (ref 3.5–5.2)
Alkaline Phosphatase: 149 U/L — ABNORMAL HIGH (ref 39–117)
BUN: 17 mg/dL (ref 6–23)
CO2: 31 mEq/L (ref 19–32)
Calcium: 9.7 mg/dL (ref 8.4–10.5)
Chloride: 105 mEq/L (ref 96–112)
Creatinine, Ser: 0.79 mg/dL (ref 0.40–1.20)
GFR: 76.3 mL/min (ref >60.00)
Glucose, Bld: 100 mg/dL — ABNORMAL HIGH (ref 70–99)
Potassium: 4.2 mEq/L (ref 3.5–5.1)
Sodium: 140 mEq/L (ref 135–145)
Total Bilirubin: 0.2 mg/dL (ref 0.2–1.2)
Total Protein: 7.2 g/dL (ref 6.0–8.3)

## 2023-05-12 LAB — LIPID PANEL
Cholesterol: 144 mg/dL (ref 0–200)
HDL: 53.6 mg/dL (ref >39.00)
LDL Cholesterol: 68 mg/dL (ref 0–99)
NonHDL: 90.5
Total CHOL/HDL Ratio: 3
Triglycerides: 112 mg/dL (ref 0.0–149.0)
VLDL: 22.4 mg/dL (ref 0.0–40.0)

## 2023-05-12 LAB — HEMOGLOBIN A1C: Hgb A1c MFr Bld: 5.9 % (ref 4.6–6.5)

## 2023-05-12 MED ORDER — PROPRANOLOL HCL 10 MG PO TABS: 10.0000 mg | ORAL_TABLET | Freq: Three times a day (TID) | ORAL | 3 refills | Status: DC | PRN

## 2023-05-12 MED ORDER — CYANOCOBALAMIN 1000 MCG/ML IJ SOLN
1000.0000 ug | Freq: Once | INTRAMUSCULAR | Status: AC
Start: 2023-05-12 — End: 2023-05-12
  Administered 2023-05-12: 1000 ug via INTRAMUSCULAR

## 2023-05-12 NOTE — Assessment & Plan Note (Signed)
Chronic Regular exercise and healthy diet encouraged Check lipid panel  Continue Crestor 20 mg daily 

## 2023-05-12 NOTE — Assessment & Plan Note (Signed)
Chronic Infrequent migraines Continue Treximet as needed 

## 2023-05-12 NOTE — Assessment & Plan Note (Signed)
Chronic GERD controlled Continue omeprazole 20 mg twice daily as needed

## 2023-05-12 NOTE — Assessment & Plan Note (Addendum)
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

## 2023-05-12 NOTE — Assessment & Plan Note (Signed)
Chronic Blood pressure well controlled CMP Continue atenolol 25 mg in the morning and 50 mg in the evening

## 2023-05-12 NOTE — Assessment & Plan Note (Signed)
Acute Symptoms consistent with probable viral sinus infection Continue symptomatic treatment with Flonase, Xyzal and over-the-counter cold medications If symptoms do not improve over the next few days she will call and let me know

## 2023-05-12 NOTE — Assessment & Plan Note (Signed)
Chronic Treatment with monthly B12 injections B12 injection due and will be given today

## 2023-05-12 NOTE — Assessment & Plan Note (Addendum)
Chronic With hyperlipidemia  Lab Results  Component Value Date   HGBA1C 6.0 11/11/2022   Sugars well controlled Check A1c Continue rybelsus to 14 mg daily Stressed regular exercise, diabetic diet Encouraged weight loss

## 2023-06-03 NOTE — Progress Notes (Unsigned)
[name] [address] [place] [address] Phone: [phone] Subjective:   Bruce Donath, am serving as a scribe for Dr. Antoine Primas.  I'm seeing this patient by the request  of:  Pincus Sanes, MD  CC: Left leg pain  [id number]  03/03/2023 Continues to have the sciatica of the left leg. Discussed with patient and left leg pain. Patient states that every time she does get the injection the leg pain does go away for quite some time. This time though it only lasted approximately 2 months. Starting to have worsening pain again. We did discuss different treatment options with patient's daughter there as well but has elected to try the injection again at this time. Follow-up with me again 6 to 8 weeks   Updated 06/07/2023 Erica [name] is a 67 y.o. female coming in with complaint of L leg, neck and knee pain. Neck pain is better after injection.   Pain radiating down the L leg seems to be improving but still present when sleeping.   Pain in R knee has not improved since last visit or since 19 years ago when he had  Would like refill of Vit D and Flexeril.       Past Medical History:  Diagnosis Date   Abnormal stress test    a. 09/2016: NST showed a small defect of mild severity present in the basal inferoseptal and mid inferoseptal location, consistent with ischemia. --> medically managed   ALLERGIC RHINITIS    Anxiety    Bursitis    DDD (degenerative disc disease), lumbar    ESI with Ramos (spring 2016)   Depression    Diabetes mellitus without complication (HCC)    Type II   Dyslipidemia    External hemorrhoids    GERD (gastroesophageal reflux disease)    Hypertension    Obesity    Osteoarthritis    Palpitations    a. prior event monitor showing sinus tachycardia, no PAF.    Panic attacks    Hx of depression   Sleep apnea    No Cpap   Past Surgical History:  Procedure Laterality Date   ABDOMINAL HYSTERECTOMY      DILATION AND CURETTAGE OF UTERUS     MASS EXCISION Left 12/30/2015   Procedure: EXCISION LEFT LEG MASS;  Surgeon: Manus Rudd, MD;  Location: [facility];  Service: General;  Laterality: Left;   NASAL TURBINATE REDUCTION Bilateral 03/04/2022   Procedure: TURBINATE REDUCTION;  Surgeon: Christia Reading, MD;  Location: [facility] OR;  Service: ENT;  Laterality: Bilateral;   REPLACEMENT TOTAL KNEE Right    RIGHT OOPHORECTOMY     No cancer   Social History   Socioeconomic History   Marital status: Married    Spouse name: Hessie Knows   Number of children: 3   Years of education: Not on file   Highest education level: Not on file  Occupational History   Occupation: Disabled  Tobacco Use   Smoking status: Never   Smokeless tobacco: Never   Tobacco comments:    tried to as a teenager  Vaping Use   Vaping status: Never Used  Substance and Sexual Activity   Alcohol use: Yes    Alcohol/week: 1.0 standard drink of alcohol    Types: 1 Glasses of wine per week    Comment: one drink a month   Drug use: No   Sexual activity: Not Currently  Other Topics Concern   Not on  file  Social History Narrative   Married with children. Pt is on disablity. Previous worked in the school system   Social Determinants of Corporate investment banker Strain: Low Risk  (02/06/2021)   Overall Financial Resource Strain (CARDIA)    Difficulty of Paying Living Expenses: Not hard at all  Food Insecurity: No Food Insecurity (02/06/2021)   Hunger Vital Sign    Worried About Running Out of Food in the Last Year: Never true    Ran Out of Food in the Last Year: Never true  Transportation Needs: No Transportation Needs (02/06/2021)   PRAPARE - Administrator, Civil Service (Medical): No    Lack of Transportation (Non-Medical): No  Physical Activity: Inactive (02/06/2021)   Exercise Vital Sign    Days of Exercise per Week: 0 days    Minutes of Exercise per Session: 0 min  Stress: No Stress Concern Present  (02/06/2021)   [employer] of Occupational Health - Occupational Stress Questionnaire    Feeling of Stress : Not at all  Social Connections: Unknown (09/30/2022)   Received from [facility]   Social Network    Social Network: Not on file   Allergies  Allergen Reactions   Seroquel [Quetiapine Fumarate] Other (See Comments)    Hallucinations, palpitations   Codeine Nausea And Vomiting   Guaifenesin-Codeine Other (See Comments)     feels spacey   Wellbutrin [Bupropion] Palpitations    hallucinations   Family History  Problem Relation Age of Onset   Heart disease Mother        Enlarged Heart, Pacemaker   Diabetes Mother    Thyroid disease Mother    Hyperlipidemia Mother    Hypertension Mother    Sleep apnea Mother    Dementia Father    Kidney failure Father    Prostate cancer Father    Alcoholism Father    Asthma Other    Allergies Sister    Asthma Son    Breast cancer Maternal Aunt        cousin   Colon cancer Cousin    Heart disease Son        CHF, morbid obesity   Prostate cancer Maternal Uncle    Diabetes Son     Current Outpatient Medications (Endocrine & Metabolic):    Semaglutide (RYBELSUS) 14 MG TABS, Take 1 tablet (14 mg total) by mouth daily.  Current Outpatient Medications (Cardiovascular):    atenolol (TENORMIN) 25 MG tablet, TAKE ONE TABLET BY MOUTH IN THE MORNING AND TAKE TWO TABLETS IN THE EVENING   furosemide (LASIX) 40 MG tablet, Take 1 tablet (40 mg total) by mouth daily.   propranolol (INDERAL) 10 MG tablet, Take 1 tablet (10 mg total) by mouth 3 (three) times daily as needed (PALPITATIONS).   rosuvastatin (CRESTOR) 20 MG tablet, Take 1 tablet (20 mg total) by mouth daily.  Current Outpatient Medications (Respiratory):    albuterol (VENTOLIN HFA) 108 (90 Base) MCG/ACT inhaler, Inhale 2 puffs into the lungs every 6 (six) hours as needed for wheezing or shortness of breath.   fluticasone (FLONASE) 50 MCG/ACT nasal spray, Place 2 sprays into  both nostrils daily.   levocetirizine (XYZAL) 5 MG tablet, Take 1 tablet (5 mg total) by mouth every evening.  Current Outpatient Medications (Analgesics):    SUMAtriptan-naproxen (TREXIMET) 85-500 MG tablet, Take 1 pill as needed for migraine, if migraine persists may repeat x1 after 2 hours  Current Outpatient Medications (Hematological):    cyanocobalamin (,VITAMIN  B-12,) 1000 MCG/ML injection, Inject 1,000 mcg into the muscle every 30 (thirty) days.  Current Outpatient Medications (Other):    clonazePAM (KLONOPIN) 0.5 MG tablet, Take 0.5 mg by mouth 2 (two) times daily as needed.   cyclobenzaprine (FLEXERIL) 5 MG tablet, Take 1 tablet (5 mg total) by mouth at bedtime.   gabapentin (NEURONTIN) 100 MG capsule, Take 1 capsule (100 mg total) by mouth at bedtime.   hydrOXYzine (ATARAX) 25 MG tablet, Take 25 mg by mouth 3 (three) times daily as needed for itching (allergies).   linaclotide (LINZESS) 290 MCG CAPS capsule, Take 1 capsule (290 mcg total) by mouth daily before breakfast.   Multiple Vitamins-Minerals (MULTIVITAMIN WITH MINERALS) tablet, Take 1 tablet by mouth daily.   omeprazole (PRILOSEC) 20 MG capsule, Take 1 capsule (20 mg total) by mouth 2 (two) times daily as needed (take 30 minutes prior to a meal).   potassium chloride SA (KLOR-CON M) 20 MEQ tablet, Take 1 tablet (20 mEq total) by mouth daily. 1 tablet by mouth daily   Tart Cherry (TART CHERRY ULTRA) 1200 MG CAPS, Take 1 capsule by mouth every evening.   TURMERIC PO, Take 1 capsule by mouth daily. Vita b-12  & b-6   valACYclovir (VALTREX) 500 MG tablet, Take 500 mg by mouth 2 (two) times daily.   venlafaxine XR (EFFEXOR-XR) 150 MG 24 hr capsule, Take 1 capsule (150 mg total) by mouth 2 (two) times daily.   Vitamin D, Ergocalciferol, (DRISDOL) 1.25 MG (50000 UNIT) CAPS capsule, Take 1 capsule (50,000 Units total) by mouth every 7 (seven) days.   Reviewed prior external information including notes and imaging from  primary  care provider As well as notes that were available from care everywhere and other healthcare systems.  Past medical history, social, surgical and family history all reviewed in electronic medical record.  No pertanent information unless stated regarding to the chief complaint.   Review of Systems:  No headache, visual changes, nausea, vomiting, diarrhea, constipation, dizziness, abdominal pain, skin rash, fevers, chills, night sweats, weight loss, swollen lymph nodes, body aches, joint swelling, chest pain, shortness of breath, mood changes. POSITIVE muscle aches  Objective  Blood pressure 106/78, pulse 76, height 5' 6.5" (1.689 m), weight 227 lb (103 kg), SpO2 96%.   General: No apparent distress alert and oriented x3 mood and affect normal, dressed appropriately.  HEENT: Pupils equal, extraocular movements intact  Respiratory: Patient's speak in full sentences and does not appear short of breath  Cardiovascular: No lower extremity edema, non tender, no erythema  Left leg is good range of motion.  Does have some tightness in the back as well as arthritic changes noted.  Patient is severely tender over the greater trochanteric area.  Seems more severe on this than the back exam.  Unable to do Pearlean Brownie secondary to body habitus.    After verbal consent patient was prepped with alcohol swab and with a 21-gauge 2 inch needle injected into the left greater trochanteric area with 2 cc of 0.5% Marcaine and 1 cc of Kenalog 40 mg/mL.  No blood loss.  Band-Aid placed.  Postinjection instructions given Impression and Recommendations:     The above documentation has been reviewed and is accurate and complete Judi Saa, DO

## 2023-06-07 ENCOUNTER — Ambulatory Visit (INDEPENDENT_AMBULATORY_CARE_PROVIDER_SITE_OTHER): Payer: Medicare Other | Admitting: Family Medicine

## 2023-06-07 ENCOUNTER — Encounter: Payer: Self-pay | Admitting: Family Medicine

## 2023-06-07 VITALS — BP 106/78 | HR 76 | Ht 66.5 in | Wt 227.0 lb

## 2023-06-07 DIAGNOSIS — M7062 Trochanteric bursitis, left hip: Secondary | ICD-10-CM | POA: Diagnosis not present

## 2023-06-07 MED ORDER — VITAMIN D (ERGOCALCIFEROL) 1.25 MG (50000 UNIT) PO CAPS: 50000.0000 [IU] | ORAL_CAPSULE | ORAL | 0 refills | Status: DC

## 2023-06-07 NOTE — Patient Instructions (Addendum)
Topical magnesium Injection in hip today See you again in 2-3 months

## 2023-06-07 NOTE — Assessment & Plan Note (Signed)
Chronic problem with worsening symptoms.  Did not respond well to the injection greater than 7 months nearly 8 months ago.  Okay for that we will get at least this type of improvement.  Encourage patient to continue to work on weight loss.  Get BMI under 30 would be ideal.  Will continue to monitor patient's other ailments.  Will follow-up again in 8 weeks otherwise.

## 2023-06-10 DIAGNOSIS — E119 Type 2 diabetes mellitus without complications: Secondary | ICD-10-CM | POA: Diagnosis not present

## 2023-06-27 ENCOUNTER — Other Ambulatory Visit: Payer: Self-pay | Admitting: Family Medicine

## 2023-06-27 ENCOUNTER — Other Ambulatory Visit: Payer: Self-pay | Admitting: Internal Medicine

## 2023-06-27 DIAGNOSIS — E785 Hyperlipidemia, unspecified: Secondary | ICD-10-CM

## 2023-07-05 ENCOUNTER — Encounter: Payer: Self-pay | Admitting: Podiatry

## 2023-07-05 ENCOUNTER — Ambulatory Visit (INDEPENDENT_AMBULATORY_CARE_PROVIDER_SITE_OTHER): Payer: Medicare Other | Admitting: Podiatry

## 2023-07-05 VITALS — Ht 66.5 in | Wt 227.0 lb

## 2023-07-05 DIAGNOSIS — L600 Ingrowing nail: Secondary | ICD-10-CM | POA: Diagnosis not present

## 2023-07-05 MED ORDER — NEOMYCIN-POLYMYXIN-HC 3.5-10000-1 OT SOLN: OTIC | 0 refills | Status: DC

## 2023-07-05 NOTE — Patient Instructions (Signed)

## 2023-07-06 NOTE — Progress Notes (Signed)
Subjective:  Patient ID: Erica [name], female    DOB: [birth year],  MRN: [id number] HPI Chief Complaint  Patient presents with   Ingrown Toenail    Patient is here for right hallux nail removal    67 y.o. female presents with the above complaint.   ROS: Denies fever chills nausea vomit muscle aches pains calf pain back pain chest pain shortness of breath.  Past Medical History:  Diagnosis Date   Abnormal stress test    a. 09/2016: NST showed a small defect of mild severity present in the basal inferoseptal and mid inferoseptal location, consistent with ischemia. --> medically managed   ALLERGIC RHINITIS    Anxiety    Bursitis    DDD (degenerative disc disease), lumbar    ESI with Ramos (spring 2016)   Depression    Diabetes mellitus without complication (HCC)    Type II   Dyslipidemia    External hemorrhoids    GERD (gastroesophageal reflux disease)    Hypertension    Obesity    Osteoarthritis    Palpitations    a. prior event monitor showing sinus tachycardia, no PAF.    Panic attacks    Hx of depression   Sleep apnea    No Cpap   Past Surgical History:  Procedure Laterality Date   ABDOMINAL HYSTERECTOMY     DILATION AND CURETTAGE OF UTERUS     MASS EXCISION Left 12/30/2015   Procedure: EXCISION LEFT LEG MASS;  Surgeon: Manus Rudd, MD;  Location: [facility];  Service: General;  Laterality: Left;   NASAL TURBINATE REDUCTION Bilateral 03/04/2022   Procedure: TURBINATE REDUCTION;  Surgeon: Christia Reading, MD;  Location: [facility] OR;  Service: ENT;  Laterality: Bilateral;   REPLACEMENT TOTAL KNEE Right    RIGHT OOPHORECTOMY     No cancer    Current Outpatient Medications:    albuterol (VENTOLIN HFA) 108 (90 Base) MCG/ACT inhaler, Inhale 2 puffs into the lungs every 6 (six) hours as needed for wheezing or shortness of breath., Disp: 16 g, Rfl: 3   atenolol (TENORMIN) 25 MG tablet, TAKE ONE TABLET BY MOUTH IN THE MORNING AND TAKE TWO TABLETS IN THE  EVENING, Disp: 270 tablet, Rfl: 1   clonazePAM (KLONOPIN) 0.5 MG tablet, Take 0.5 mg by mouth 2 (two) times daily as needed., Disp: , Rfl:    CRESTOR 20 MG tablet, TAKE ONE TABLET BY MOUTH EVERY DAY, Disp: 90 tablet, Rfl: 2   cyanocobalamin (,VITAMIN B-12,) 1000 MCG/ML injection, Inject 1,000 mcg into the muscle every 30 (thirty) days., Disp: , Rfl:    cyclobenzaprine (FLEXERIL) 5 MG tablet, Take 1 tablet (5 mg total) by mouth at bedtime., Disp: 30 tablet, Rfl: 1   fluticasone (FLONASE) 50 MCG/ACT nasal spray, Place 2 sprays into both nostrils daily., Disp: 16 g, Rfl: 11   furosemide (LASIX) 40 MG tablet, Take 1 tablet (40 mg total) by mouth daily., Disp: 90 tablet, Rfl: 2   gabapentin (NEURONTIN) 100 MG capsule, Take 1 capsule (100 mg total) by mouth at bedtime., Disp: 90 capsule, Rfl: 0   hydrOXYzine (ATARAX) 25 MG tablet, Take 25 mg by mouth 3 (three) times daily as needed for itching (allergies)., Disp: , Rfl:    levocetirizine (XYZAL) 5 MG tablet, Take 1 tablet (5 mg total) by mouth every evening., Disp: 90 tablet, Rfl: 2   linaclotide (LINZESS) 290 MCG CAPS capsule, Take 1 capsule (290 mcg total) by mouth daily before breakfast., Disp: 90 capsule, Rfl: 2  Multiple Vitamins-Minerals (MULTIVITAMIN WITH MINERALS) tablet, Take 1 tablet by mouth daily., Disp: , Rfl:    neomycin-polymyxin-hydrocortisone (CORTISPORIN) OTIC solution, Apply one to two drops to toe after soaking twice daily., Disp: 10 mL, Rfl: 0   omeprazole (PRILOSEC) 20 MG capsule, Take 1 capsule (20 mg total) by mouth 2 (two) times daily as needed (take 30 minutes prior to a meal)., Disp: 180 capsule, Rfl: 2   potassium chloride SA (KLOR-CON M) 20 MEQ tablet, Take 1 tablet (20 mEq total) by mouth daily. 1 tablet by mouth daily, Disp: 90 tablet, Rfl: 3   propranolol (INDERAL) 10 MG tablet, Take 1 tablet (10 mg total) by mouth 3 (three) times daily as needed (PALPITATIONS)., Disp: 60 tablet, Rfl: 3   Semaglutide (RYBELSUS) 14 MG TABS,  Take 1 tablet (14 mg total) by mouth daily., Disp: 90 tablet, Rfl: 2   SUMAtriptan-naproxen (TREXIMET) 85-500 MG tablet, Take 1 pill as needed for migraine, if migraine persists may repeat x1 after 2 hours, Disp: 10 tablet, Rfl: 8   Tart Cherry (TART CHERRY ULTRA) 1200 MG CAPS, Take 1 capsule by mouth every evening., Disp: , Rfl:    TURMERIC PO, Take 1 capsule by mouth daily. Vita b-12  & b-6, Disp: , Rfl:    valACYclovir (VALTREX) 500 MG tablet, Take 500 mg by mouth 2 (two) times daily., Disp: , Rfl:    venlafaxine XR (EFFEXOR-XR) 150 MG 24 hr capsule, Take 1 capsule (150 mg total) by mouth 2 (two) times daily., Disp: 180 capsule, Rfl: 2   Vitamin D, Ergocalciferol, (DRISDOL) 1.25 MG (50000 UNIT) CAPS capsule, TAKE ONE CAPSULE BY MOUTH EVERY SEVEN DAYS (MAY CONTAIN SOY OR PEANUT--TALK TO YOUR DOCTOR BEFORE TAKING IF YOU ARE ALLERGIC), Disp: 12 capsule, Rfl: 0  Allergies  Allergen Reactions   Seroquel [Quetiapine Fumarate] Other (See Comments)    Hallucinations, palpitations   Codeine Nausea And Vomiting   Guaifenesin-Codeine Other (See Comments)     feels spacey   Wellbutrin [Bupropion] Palpitations    hallucinations   Review of Systems Objective:  There were no vitals filed for this visit.  General: Well developed, nourished, in no acute distress, alert and oriented x3   Dermatological: Skin is warm, dry and supple bilateral. Nails x 10 are well maintained; remaining integument appears unremarkable at this time. There are no open sores, no preulcerative lesions, no rash or signs of infection present.  However the hallux nail right does demonstrate a thick dystrophic portion of the nail that is still present and painful on palpation there is no purulence no malodor.  Vascular: Dorsalis Pedis artery and Posterior Tibial artery pedal pulses are 2/4 bilateral with immedate capillary fill time. Pedal hair growth present. No varicosities and no lower extremity edema present bilateral.    Neruologic: Grossly intact via light touch bilateral. Vibratory intact via tuning fork bilateral. Protective threshold with Semmes Wienstein monofilament intact to all pedal sites bilateral. Patellar and Achilles deep tendon reflexes 2+ bilateral. No Babinski or clonus noted bilateral.   Musculoskeletal: No gross boney pedal deformities bilateral. No pain, crepitus, or limitation noted with foot and ankle range of motion bilateral. Muscular strength 5/5 in all groups tested bilateral.  Gait: Unassisted, Nonantalgic.    Radiographs:  None taken  Assessment & Plan:   Assessment: Painful ingrown nail hallux right  Plan: Nail avulsion was accomplished today after local anesthetic was administered.  This was done under sterile technique tolerated the procedure well.  He was provided with both  oral and home-going instructions as well as a prescription for Cortisporin Otic to be applied twice daily after soaking.  I will follow-up with her in 2 weeks     Jacklyn Branan T. [place], [place]

## 2023-07-12 ENCOUNTER — Ambulatory Visit (HOSPITAL_COMMUNITY)
Admission: RE | Admit: 2023-07-12 | Discharge: 2023-07-12 | Disposition: A | Discharge status: Home / Self Care | Payer: Medicare Other | Source: Ambulatory Visit | Attending: Cardiology | Admitting: Cardiology

## 2023-07-12 DIAGNOSIS — I773 Arterial fibromuscular dysplasia: Secondary | ICD-10-CM | POA: Insufficient documentation

## 2023-07-14 ENCOUNTER — Encounter: Payer: Self-pay | Admitting: Podiatry

## 2023-07-14 ENCOUNTER — Ambulatory Visit: Payer: Medicare Other | Admitting: Podiatry

## 2023-07-14 DIAGNOSIS — Z9889 Other specified postprocedural states: Secondary | ICD-10-CM

## 2023-07-14 DIAGNOSIS — L600 Ingrowing nail: Secondary | ICD-10-CM

## 2023-07-14 NOTE — Progress Notes (Signed)
She presents today for follow-up of her hallux right where we avulsed the nail last time she was in.  States that she had to do a lot of traveling and went to [place] states that her toe was throbbing while she was there but she says all in all it seems to be doing better now.  Continues to soak regularly.  Objective: There is no erythema edema salines drainage odor nail bed is healing very nicely no open lesions or wounds.  Assessment: Well-healing surgical toe.  Plan: Continue to soak for about another week or so.  Covered in today with a Band-Aid but leave open at night.  Questions or concerns notify me immediately.

## 2023-07-19 ENCOUNTER — Other Ambulatory Visit: Payer: Self-pay | Admitting: Internal Medicine

## 2023-08-03 NOTE — Progress Notes (Deleted)
[name] [address] [place] [address] Phone: [phone] Subjective:    I'm seeing this patient by the request  of:  Pincus Sanes, MD  CC:   [id number]  06/07/2023 Chronic problem with worsening symptoms.  Did not respond well to the injection greater than 7 months nearly 8 months ago.  Okay for that we will get at least this type of improvement.  Encourage patient to continue to work on weight loss.  Get BMI under 30 would be ideal.  Will continue to monitor patient's other ailments.  Will follow-up again in 8 weeks otherwise.     Updated 08/09/2023 Erica [name] is a 67 y.o. female coming in with complaint of L hip pain      Past Medical History:  Diagnosis Date   Abnormal stress test    a. 09/2016: NST showed a small defect of mild severity present in the basal inferoseptal and mid inferoseptal location, consistent with ischemia. --> medically managed   ALLERGIC RHINITIS    Anxiety    Bursitis    DDD (degenerative disc disease), lumbar    ESI with Ramos (spring 2016)   Depression    Diabetes mellitus without complication (HCC)    Type II   Dyslipidemia    External hemorrhoids    GERD (gastroesophageal reflux disease)    Hypertension    Obesity    Osteoarthritis    Palpitations    a. prior event monitor showing sinus tachycardia, no PAF.    Panic attacks    Hx of depression   Sleep apnea    No Cpap   Past Surgical History:  Procedure Laterality Date   ABDOMINAL HYSTERECTOMY     DILATION AND CURETTAGE OF UTERUS     MASS EXCISION Left 12/30/2015   Procedure: EXCISION LEFT LEG MASS;  Surgeon: Manus Rudd, MD;  Location: [facility];  Service: General;  Laterality: Left;   NASAL TURBINATE REDUCTION Bilateral 03/04/2022   Procedure: TURBINATE REDUCTION;  Surgeon: Christia Reading, MD;  Location: [facility] OR;  Service: ENT;  Laterality: Bilateral;   REPLACEMENT TOTAL KNEE Right    RIGHT OOPHORECTOMY     No  cancer   Social History   Socioeconomic History   Marital status: Married    Spouse name: Hessie Knows   Number of children: 3   Years of education: Not on file   Highest education level: Not on file  Occupational History   Occupation: Disabled  Tobacco Use   Smoking status: Never   Smokeless tobacco: Never   Tobacco comments:    tried to as a teenager  Vaping Use   Vaping status: Never Used  Substance and Sexual Activity   Alcohol use: Yes    Alcohol/week: 1.0 standard drink of alcohol    Types: 1 Glasses of wine per week    Comment: one drink a month   Drug use: No   Sexual activity: Not Currently  Other Topics Concern   Not on file  Social History Narrative   Married with children. Pt is on disablity. Previous worked in the school system   Social Determinants of Corporate investment banker Strain: Low Risk  (02/06/2021)   Overall Financial Resource Strain (CARDIA)    Difficulty of Paying Living Expenses: Not hard at all  Food Insecurity: No Food Insecurity (02/06/2021)   Hunger Vital Sign    Worried About Running Out of Food in the Last Year: Never true  Ran Out of Food in the Last Year: Never true  Transportation Needs: No Transportation Needs (02/06/2021)   PRAPARE - Administrator, Civil Service (Medical): No    Lack of Transportation (Non-Medical): No  Physical Activity: Inactive (02/06/2021)   Exercise Vital Sign    Days of Exercise per Week: 0 days    Minutes of Exercise per Session: 0 min  Stress: No Stress Concern Present (02/06/2021)   [employer] of Occupational Health - Occupational Stress Questionnaire    Feeling of Stress : Not at all  Social Connections: Unknown (09/30/2022)   Received from [facility]   Social Network    Social Network: Not on file   Allergies  Allergen Reactions   Seroquel [Quetiapine Fumarate] Other (See Comments)    Hallucinations, palpitations   Codeine Nausea And Vomiting   Guaifenesin-Codeine  Other (See Comments)     feels spacey   Wellbutrin [Bupropion] Palpitations    hallucinations   Family History  Problem Relation Age of Onset   Heart disease Mother        Enlarged Heart, Pacemaker   Diabetes Mother    Thyroid disease Mother    Hyperlipidemia Mother    Hypertension Mother    Sleep apnea Mother    Dementia Father    Kidney failure Father    Prostate cancer Father    Alcoholism Father    Asthma Other    Allergies Sister    Asthma Son    Breast cancer Maternal Aunt        cousin   Colon cancer Cousin    Heart disease Son        CHF, morbid obesity   Prostate cancer Maternal Uncle    Diabetes Son     Current Outpatient Medications (Endocrine & Metabolic):    Semaglutide (RYBELSUS) 14 MG TABS, Take 1 tablet (14 mg total) by mouth daily.  Current Outpatient Medications (Cardiovascular):    atenolol (TENORMIN) 25 MG tablet, TAKE ONE TABLET BY MOUTH IN THE MORNING AND TAKE TWO TABLETS IN THE EVENING   CRESTOR 20 MG tablet, TAKE ONE TABLET BY MOUTH EVERY DAY   furosemide (LASIX) 40 MG tablet, Take 1 tablet (40 mg total) by mouth daily.   propranolol (INDERAL) 10 MG tablet, Take 1 tablet (10 mg total) by mouth 3 (three) times daily as needed (PALPITATIONS).  Current Outpatient Medications (Respiratory):    albuterol (VENTOLIN HFA) 108 (90 Base) MCG/ACT inhaler, Inhale 2 puffs into the lungs every 6 (six) hours as needed for wheezing or shortness of breath.   fluticasone (FLONASE) 50 MCG/ACT nasal spray, Place 2 sprays into both nostrils daily.   levocetirizine (XYZAL) 5 MG tablet, Take 1 tablet (5 mg total) by mouth every evening.  Current Outpatient Medications (Analgesics):    SUMAtriptan-naproxen (TREXIMET) 85-500 MG tablet, Take 1 pill as needed for migraine, if migraine persists may repeat x1 after 2 hours  Current Outpatient Medications (Hematological):    cyanocobalamin (,VITAMIN B-12,) 1000 MCG/ML injection, Inject 1,000 mcg into the muscle every 30  (thirty) days.  Current Outpatient Medications (Other):    clonazePAM (KLONOPIN) 0.5 MG tablet, Take 0.5 mg by mouth 2 (two) times daily as needed.   cyclobenzaprine (FLEXERIL) 5 MG tablet, Take 1 tablet (5 mg total) by mouth at bedtime.   gabapentin (NEURONTIN) 100 MG capsule, Take 1 capsule (100 mg total) by mouth at bedtime.   hydrOXYzine (ATARAX) 25 MG tablet, Take 25 mg by mouth 3 (  three) times daily as needed for itching (allergies).   LINZESS 290 MCG CAPS capsule, TAKE ONE CAPSULE BY MOUTH EVERY DAY BEFORE BREAKFAST   Multiple Vitamins-Minerals (MULTIVITAMIN WITH MINERALS) tablet, Take 1 tablet by mouth daily.   neomycin-polymyxin-hydrocortisone (CORTISPORIN) OTIC solution, Apply one to two drops to toe after soaking twice daily.   omeprazole (PRILOSEC) 20 MG capsule, Take 1 capsule (20 mg total) by mouth 2 (two) times daily as needed (take 30 minutes prior to a meal).   potassium chloride SA (KLOR-CON M) 20 MEQ tablet, Take 1 tablet (20 mEq total) by mouth daily. 1 tablet by mouth daily   Tart Cherry (TART CHERRY ULTRA) 1200 MG CAPS, Take 1 capsule by mouth every evening.   TURMERIC PO, Take 1 capsule by mouth daily. Vita b-12  & b-6   valACYclovir (VALTREX) 500 MG tablet, Take 500 mg by mouth 2 (two) times daily.   venlafaxine XR (EFFEXOR-XR) 150 MG 24 hr capsule, Take 1 capsule (150 mg total) by mouth 2 (two) times daily.   Vitamin D, Ergocalciferol, (DRISDOL) 1.25 MG (50000 UNIT) CAPS capsule, TAKE ONE CAPSULE BY MOUTH EVERY SEVEN DAYS (MAY CONTAIN SOY OR PEANUT--TALK TO YOUR DOCTOR BEFORE TAKING IF YOU ARE ALLERGIC)   Reviewed prior external information including notes and imaging from  primary care provider As well as notes that were available from care everywhere and other healthcare systems.  Past medical history, social, surgical and family history all reviewed in electronic medical record.  No pertanent information unless stated regarding to the chief complaint.   Review of  Systems:  No headache, visual changes, nausea, vomiting, diarrhea, constipation, dizziness, abdominal pain, skin rash, fevers, chills, night sweats, weight loss, swollen lymph nodes, body aches, joint swelling, chest pain, shortness of breath, mood changes. POSITIVE muscle aches  Objective  There were no vitals taken for this visit.   General: No apparent distress alert and oriented x3 mood and affect normal, dressed appropriately.  HEENT: Pupils equal, extraocular movements intact  Respiratory: Patient's speak in full sentences and does not appear short of breath  Cardiovascular: No lower extremity edema, non tender, no erythema      Impression and Recommendations:

## 2023-08-09 ENCOUNTER — Ambulatory Visit: Payer: Medicare Other | Admitting: Family Medicine

## 2023-08-30 ENCOUNTER — Ambulatory Visit (INDEPENDENT_AMBULATORY_CARE_PROVIDER_SITE_OTHER): Payer: Medicare Other

## 2023-08-30 DIAGNOSIS — Z0000 Encounter for general adult medical examination without abnormal findings: Secondary | ICD-10-CM

## 2023-08-30 NOTE — Patient Instructions (Signed)
Erica [name] , Thank you for taking time to come for your Medicare Wellness Visit. I appreciate your ongoing commitment to your health goals. Please review the following plan we discussed and let me know if I can assist you in the future.   Screening recommendations/referrals: Colonoscopy: Education provided Mammogram: up to date Bone Density: up to date Recommended yearly ophthalmology/optometry visit for glaucoma screening and checkup Recommended yearly dental visit for hygiene and checkup  Vaccinations: Influenza vaccine:  Pneumococcal vaccine: up to date Tdap vaccine: up to date Shingles vaccine: up to date     Preventive Care 65 Years and Older, Female Preventive care refers to lifestyle choices and visits with your health care provider that can promote health and wellness. What does preventive care include? A yearly physical exam. This is also called an annual well check. Dental exams once or twice a year. Routine eye exams. Ask your health care provider how often you should have your eyes checked. Personal lifestyle choices, including: Daily care of your teeth and gums. Regular physical activity. Eating a healthy diet. Avoiding tobacco and drug use. Limiting alcohol use. Practicing safe sex. Taking low-dose aspirin every day. Taking vitamin and mineral supplements as recommended by your health care provider. What happens during an annual well check? The services and screenings done by your health care provider during your annual well check will depend on your age, overall health, lifestyle risk factors, and family history of disease. Counseling  Your health care provider may ask you questions about your: Alcohol use. Tobacco use. Drug use. Emotional well-being. Home and relationship well-being. Sexual activity. Eating habits. History of falls. Memory and ability to understand (cognition). Work and work Astronomer. Reproductive health. Screening  You may have the  following tests or measurements: Height, weight, and BMI. Blood pressure. Lipid and cholesterol levels. These may be checked every 5 years, or more frequently if you are over 19 years old. Skin check. Lung cancer screening. You may have this screening every year starting at age 64 if you have a 30-pack-year history of smoking and currently smoke or have quit within the past 15 years. Fecal occult blood test (FOBT) of the stool. You may have this test every year starting at age 26. Flexible sigmoidoscopy or colonoscopy. You may have a sigmoidoscopy every 5 years or a colonoscopy every 10 years starting at age 50. Hepatitis C blood test. Hepatitis B blood test. Sexually transmitted disease (STD) testing. Diabetes screening. This is done by checking your blood sugar (glucose) after you have not eaten for a while (fasting). You may have this done every 1-3 years. Bone density scan. This is done to screen for osteoporosis. You may have this done starting at age 42. Mammogram. This may be done every 1-2 years. Talk to your health care provider about how often you should have regular mammograms. Talk with your health care provider about your test results, treatment options, and if necessary, the need for more tests. Vaccines  Your health care provider may recommend certain vaccines, such as: Influenza vaccine. This is recommended every year. Tetanus, diphtheria, and acellular pertussis (Tdap, Td) vaccine. You may need a Td booster every 10 years. Zoster vaccine. You may need this after age 34. Pneumococcal 13-valent conjugate (PCV13) vaccine. One dose is recommended after age 67. Pneumococcal polysaccharide (PPSV23) vaccine. One dose is recommended after age 63. Talk to your health care provider about which screenings and vaccines you need and how often you need them. This information is not intended  to replace advice given to you by your health care provider. Make sure you discuss any questions you  have with your health care provider. Document Released: 10/24/2015 Document Revised: 06/16/2016 Document Reviewed: 07/29/2015 Elsevier Interactive Patient Education  2017 [employer].  Fall Prevention in the Home Falls can cause injuries. They can happen to people of all ages. There are many things you can do to make your home safe and to help prevent falls. What can I do on the outside of my home? Regularly fix the edges of walkways and driveways and fix any cracks. Remove anything that might make you trip as you walk through a door, such as a raised step or threshold. Trim any bushes or trees on the path to your home. Use bright outdoor lighting. Clear any walking paths of anything that might make someone trip, such as rocks or tools. Regularly check to see if handrails are loose or broken. Make sure that both sides of any steps have handrails. Any raised decks and porches should have guardrails on the edges. Have any leaves, snow, or ice cleared regularly. Use sand or salt on walking paths during winter. Clean up any spills in your garage right away. This includes oil or grease spills. What can I do in the bathroom? Use night lights. Install grab bars by the toilet and in the tub and shower. Do not use towel bars as grab bars. Use non-skid mats or decals in the tub or shower. If you need to sit down in the shower, use a plastic, non-slip stool. Keep the floor dry. Clean up any water that spills on the floor as soon as it happens. Remove soap buildup in the tub or shower regularly. Attach bath mats securely with double-sided non-slip rug tape. Do not have throw rugs and other things on the floor that can make you trip. What can I do in the bedroom? Use night lights. Make sure that you have a light by your bed that is easy to reach. Do not use any sheets or blankets that are too big for your bed. They should not hang down onto the floor. Have a firm chair that has side arms. You can  use this for support while you get dressed. Do not have throw rugs and other things on the floor that can make you trip. What can I do in the kitchen? Clean up any spills right away. Avoid walking on wet floors. Keep items that you use a lot in easy-to-reach places. If you need to reach something above you, use a strong step stool that has a grab bar. Keep electrical cords out of the way. Do not use floor polish or wax that makes floors slippery. If you must use wax, use non-skid floor wax. Do not have throw rugs and other things on the floor that can make you trip. What can I do with my stairs? Do not leave any items on the stairs. Make sure that there are handrails on both sides of the stairs and use them. Fix handrails that are broken or loose. Make sure that handrails are as long as the stairways. Check any carpeting to make sure that it is firmly attached to the stairs. Fix any carpet that is loose or worn. Avoid having throw rugs at the top or bottom of the stairs. If you do have throw rugs, attach them to the floor with carpet tape. Make sure that you have a light switch at the top of the stairs and  the bottom of the stairs. If you do not have them, ask someone to add them for you. What else can I do to help prevent falls? Wear shoes that: Do not have high heels. Have rubber bottoms. Are comfortable and fit you well. Are closed at the toe. Do not wear sandals. If you use a stepladder: Make sure that it is fully opened. Do not climb a closed stepladder. Make sure that both sides of the stepladder are locked into place. Ask someone to hold it for you, if possible. Clearly mark and make sure that you can see: Any grab bars or handrails. First and last steps. Where the edge of each step is. Use tools that help you move around (mobility aids) if they are needed. These include: Canes. Walkers. Scooters. Crutches. Turn on the lights when you go into a dark area. Replace any light  bulbs as soon as they burn out. Set up your furniture so you have a clear path. Avoid moving your furniture around. If any of your floors are uneven, fix them. If there are any pets around you, be aware of where they are. Review your medicines with your doctor. Some medicines can make you feel dizzy. This can increase your chance of falling. Ask your doctor what other things that you can do to help prevent falls. This information is not intended to replace advice given to you by your health care provider. Make sure you discuss any questions you have with your health care provider. Document Released: 07/24/2009 Document Revised: 03/04/2016 Document Reviewed: 11/01/2014 Elsevier Interactive Patient Education  2017 [employer].

## 2023-08-30 NOTE — Progress Notes (Signed)
Subjective:   Erica [name] is a 67 y.o. female who presents for Medicare Annual (Subsequent) preventive examination.  Visit Complete: Virtual I connected with  Erica [name] on 08/30/23 by a video and audio enabled telemedicine application and verified that I am speaking with the correct person using two identifiers.  Patient Location: Home  Provider Location: Home Office  I discussed the limitations of evaluation and management by telemedicine. The patient expressed understanding and agreed to proceed.  Vital Signs: Because this visit was a virtual/telehealth visit, some criteria may be missing or patient reported. Any vitals not documented were not able to be obtained and vitals that have been documented are patient reported.  Cardiac Risk Factors include: advanced age (>30men, >55 women);diabetes mellitus;hypertension;family history of premature cardiovascular disease     Objective:    There were no vitals filed for this visit. There is no height or weight on file to calculate BMI.     08/30/2023    3:54 PM 08/02/2022    2:54 PM 03/03/2022    1:54 PM 02/06/2021    9:59 AM 01/27/2021   11:17 AM 01/01/2021   11:32 AM 09/14/2020    2:24 PM  Advanced Directives  Does Patient Have a Medical Advance Directive? Yes No Yes Yes Yes Yes No  Type of Diplomatic Services operational officer  Living will Healthcare Power of [place];Living will Living will    Does patient want to make changes to medical advance directive?   No - Patient declined No - Patient declined  Yes (MAU/Ambulatory/Procedural Areas - Information given)   Copy of Healthcare Power of Attorney in Chart? No - copy requested  No - copy requested No - copy requested       Current Medications (verified) Outpatient Encounter Medications as of 08/30/2023  Medication Sig   albuterol (VENTOLIN HFA) 108 (90 Base) MCG/ACT inhaler Inhale 2 puffs into the lungs every 6 (six) hours as needed for wheezing or  shortness of breath.   atenolol (TENORMIN) 25 MG tablet TAKE ONE TABLET BY MOUTH IN THE MORNING AND TAKE TWO TABLETS IN THE EVENING   clonazePAM (KLONOPIN) 0.5 MG tablet Take 0.5 mg by mouth 2 (two) times daily as needed.   CRESTOR 20 MG tablet TAKE ONE TABLET BY MOUTH EVERY DAY   cyanocobalamin (,VITAMIN B-12,) 1000 MCG/ML injection Inject 1,000 mcg into the muscle every 30 (thirty) days.   cyclobenzaprine (FLEXERIL) 5 MG tablet Take 1 tablet (5 mg total) by mouth at bedtime.   fluticasone (FLONASE) 50 MCG/ACT nasal spray Place 2 sprays into both nostrils daily.   furosemide (LASIX) 40 MG tablet Take 1 tablet (40 mg total) by mouth daily.   gabapentin (NEURONTIN) 100 MG capsule Take 1 capsule (100 mg total) by mouth at bedtime.   hydrOXYzine (ATARAX) 25 MG tablet Take 25 mg by mouth 3 (three) times daily as needed for itching (allergies).   levocetirizine (XYZAL) 5 MG tablet Take 1 tablet (5 mg total) by mouth every evening.   LINZESS 290 MCG CAPS capsule TAKE ONE CAPSULE BY MOUTH EVERY DAY BEFORE BREAKFAST   Multiple Vitamins-Minerals (MULTIVITAMIN WITH MINERALS) tablet Take 1 tablet by mouth daily.   neomycin-polymyxin-hydrocortisone (CORTISPORIN) OTIC solution Apply one to two drops to toe after soaking twice daily.   omeprazole (PRILOSEC) 20 MG capsule Take 1 capsule (20 mg total) by mouth 2 (two) times daily as needed (take 30 minutes prior to a meal).   potassium chloride SA (KLOR-CON M) 20  MEQ tablet Take 1 tablet (20 mEq total) by mouth daily. 1 tablet by mouth daily   propranolol (INDERAL) 10 MG tablet Take 1 tablet (10 mg total) by mouth 3 (three) times daily as needed (PALPITATIONS).   Semaglutide (RYBELSUS) 14 MG TABS Take 1 tablet (14 mg total) by mouth daily.   SUMAtriptan-naproxen (TREXIMET) 85-500 MG tablet Take 1 pill as needed for migraine, if migraine persists may repeat x1 after 2 hours   Tart Cherry (TART CHERRY ULTRA) 1200 MG CAPS Take 1 capsule by mouth every evening.    TURMERIC PO Take 1 capsule by mouth daily. Vita b-12  & b-6   valACYclovir (VALTREX) 500 MG tablet Take 500 mg by mouth 2 (two) times daily.   venlafaxine XR (EFFEXOR-XR) 150 MG 24 hr capsule Take 1 capsule (150 mg total) by mouth 2 (two) times daily.   Vitamin D, Ergocalciferol, (DRISDOL) 1.25 MG (50000 UNIT) CAPS capsule TAKE ONE CAPSULE BY MOUTH EVERY SEVEN DAYS (MAY CONTAIN SOY OR PEANUT--TALK TO YOUR DOCTOR BEFORE TAKING IF YOU ARE ALLERGIC)   [DISCONTINUED] oxycodone (OXY-IR) 5 MG capsule Take 5 mg by mouth every 4 (four) hours as needed. For pain.   No facility-administered encounter medications on file as of 08/30/2023.    Allergies (verified) Seroquel [quetiapine fumarate], Codeine, Guaifenesin-codeine, and Wellbutrin [bupropion]   History: Past Medical History:  Diagnosis Date   Abnormal stress test    a. 09/2016: NST showed a small defect of mild severity present in the basal inferoseptal and mid inferoseptal location, consistent with ischemia. --> medically managed   ALLERGIC RHINITIS    Anxiety    Bursitis    DDD (degenerative disc disease), lumbar    ESI with Ramos (spring 2016)   Depression    Diabetes mellitus without complication (HCC)    Type II   Dyslipidemia    External hemorrhoids    GERD (gastroesophageal reflux disease)    Hypertension    Obesity    Osteoarthritis    Palpitations    a. prior event monitor showing sinus tachycardia, no PAF.    Panic attacks    Hx of depression   Sleep apnea    No Cpap   Past Surgical History:  Procedure Laterality Date   ABDOMINAL HYSTERECTOMY     DILATION AND CURETTAGE OF UTERUS     MASS EXCISION Left 12/30/2015   Procedure: EXCISION LEFT LEG MASS;  Surgeon: Manus Rudd, MD;  Location: [facility];  Service: General;  Laterality: Left;   NASAL TURBINATE REDUCTION Bilateral 03/04/2022   Procedure: TURBINATE REDUCTION;  Surgeon: Christia Reading, MD;  Location: [facility] OR;  Service: ENT;  Laterality: Bilateral;    REPLACEMENT TOTAL KNEE Right    RIGHT OOPHORECTOMY     No cancer   Family History  Problem Relation Age of Onset   Heart disease Mother        Enlarged Heart, Pacemaker   Diabetes Mother    Thyroid disease Mother    Hyperlipidemia Mother    Hypertension Mother    Sleep apnea Mother    Dementia Father    Kidney failure Father    Prostate cancer Father    Alcoholism Father    Asthma Other    Allergies Sister    Asthma Son    Breast cancer Maternal Aunt        cousin   Colon cancer Cousin    Heart disease Son        CHF, morbid obesity  Prostate cancer Maternal Uncle    Diabetes Son    Social History   Socioeconomic History   Marital status: Married    Spouse name: Hessie Knows   Number of children: 3   Years of education: Not on file   Highest education level: Not on file  Occupational History   Occupation: Disabled  Tobacco Use   Smoking status: Never   Smokeless tobacco: Never   Tobacco comments:    tried to as a teenager  Advertising account planner   Vaping status: Never Used  Substance and Sexual Activity   Alcohol use: Yes    Alcohol/week: 1.0 standard drink of alcohol    Types: 1 Glasses of wine per week    Comment: one drink a month   Drug use: No   Sexual activity: Not Currently  Other Topics Concern   Not on file  Social History Narrative   Married with children. Pt is on disablity. Previous worked in the school system   Social Determinants of Corporate investment banker Strain: Low Risk  (08/30/2023)   Overall Financial Resource Strain (CARDIA)    Difficulty of Paying Living Expenses: Not hard at all  Food Insecurity: No Food Insecurity (08/30/2023)   Hunger Vital Sign    Worried About Running Out of Food in the Last Year: Never true    Ran Out of Food in the Last Year: Never true  Transportation Needs: No Transportation Needs (08/30/2023)   PRAPARE - Administrator, Civil Service (Medical): No    Lack of Transportation (Non-Medical): No  Physical  Activity: Inactive (08/30/2023)   Exercise Vital Sign    Days of Exercise per Week: 0 days    Minutes of Exercise per Session: 0 min  Stress: Stress Concern Present (08/30/2023)   [employer] of Occupational Health - Occupational Stress Questionnaire    Feeling of Stress : To some extent  Social Connections: Moderately Integrated (08/30/2023)   Social Connection and Isolation Panel [NHANES]    Frequency of Communication with Friends and Family: More than three times a week    Frequency of Social Gatherings with Friends and Family: Twice a week    Attends Religious Services: More than 4 times per year    Active Member of [employer] or Organizations: No    Attends Engineer, structural: Never    Marital Status: Married    Tobacco Counseling Counseling given: Not Answered Tobacco comments: tried to as a teenager   Clinical Intake:  Pre-visit preparation completed: Yes  Pain : No/denies pain     Diabetes: Yes CBG done?: No Did pt. bring in CBG monitor from home?: No  How often do you need to have someone help you when you read instructions, pamphlets, or other written materials from your doctor or pharmacy?: 1 - Never     Information entered by :: Remi Haggard LPN   Activities of Daily Living    08/30/2023    3:55 PM  In your present state of health, do you have any difficulty performing the following activities:  Hearing? 0  Vision? 0  Difficulty concentrating or making decisions? 1  Walking or climbing stairs? 0  Dressing or bathing? 0  Doing errands, shopping? 0  Preparing Food and eating ? N  Using the Toilet? N  In the past six months, have you accidently leaked urine? N  Do you have problems with loss of bowel control? N  Managing your Medications? N  Managing your Finances?  N  Housekeeping or managing your Housekeeping? N    Patient Care Team: Pincus Sanes, MD as PCP - General (Internal Medicine) Rollene Rotunda, MD as PCP - Cardiology  (Cardiology) Hart Carwin, MD (Inactive) as Consulting Physician (Gastroenterology) Shea Evans, MD as Consulting Physician (Obstetrics and Gynecology) Sheran Luz, MD as Consulting Physician (Physical Medicine and Rehabilitation) Ollen Gross, MD as Consulting Physician (Orthopedic Surgery) Coralyn Helling, MD (Inactive) (Pulmonary Disease) Kathyrn Sheriff, [facility] (Inactive) as Pharmacist (Pharmacist)  Indicate any recent Medical Services you may have received from other than [facility] providers in the past year (date may be approximate).     Assessment:   This is a routine wellness examination for [place].  Hearing/Vision screen Hearing Screening - Comments:: No trouble hearing Vision Screening - Comments:: [facility] Up to date   Goals Addressed             This Visit's Progress    Weight (lb) < 200 lb (90.7 kg)         Depression Screen    08/30/2023    3:56 PM 05/12/2023   10:29 AM 11/11/2022   10:35 AM 05/11/2022   10:43 AM 02/10/2022    9:08 AM 02/06/2021   10:20 AM 01/01/2021   11:33 AM  PHQ 2/9 Scores  PHQ - 2 Score 3 0 2 3 3  0 1  PHQ- 9 Score 8 6 4 13 4       Fall Risk    08/30/2023    4:10 PM 08/30/2023    3:54 PM 05/12/2023   10:29 AM 11/11/2022   10:34 AM 05/11/2022   10:44 AM  Fall Risk   Falls in the past year? 0 0 0 0 0  Number falls in past yr: 0 0 0 0 0  Injury with Fall? 0 0 0 0 0  Risk for fall due to :  Impaired balance/gait No Fall Risks No Fall Risks No Fall Risks  Follow up Falls evaluation completed;Education provided;Falls prevention discussed Falls evaluation completed;Education provided;Falls prevention discussed Falls evaluation completed Falls evaluation completed Falls evaluation completed    MEDICARE RISK AT HOME: Medicare Risk at Home Any stairs in or around the home?: No If so, are there any without handrails?: No Home free of loose throw rugs in walkways, pet beds, electrical cords, etc?: Yes Adequate lighting in your home to reduce  risk of falls?: Yes Life alert?: No Use of a cane, walker or w/c?: Yes Grab bars in the bathroom?: Yes Shower chair or bench in shower?: Yes Elevated toilet seat or a handicapped toilet?: Yes  TIMED UP AND GO:  Was the test performed?  No    Cognitive Function:    09/27/2017   10:44 AM  MMSE - Mini Mental State Exam  Orientation to time 5  Orientation to Place 5  Registration 3  Attention/ Calculation 3  Recall 3  Language- name 2 objects 2  Language- repeat 1  Language- follow 3 step command 3  Language- read & follow direction 1  Write a sentence 1  Copy design 1  Total score 28        08/30/2023    3:58 PM  6CIT Screen  What Year? 0 points  What month? 0 points  What time? 0 points  Count back from 20 0 points  Months in reverse 0 points  Repeat phrase 0 points  Total Score 0 points    Immunizations Immunization History  Administered Date(s) Administered   Fluad Quad(high  Dose 65+) 07/14/2019, 10/31/2020, 08/10/2021, 10/26/2022   Influenza,inj,Quad PF,6+ Mos 07/25/2013, 09/09/2014, 08/22/2015, 09/27/2016, 09/27/2017, 08/01/2018   PFIZER Comirnaty(Gray Top)Covid-19 Tri-Sucrose Vaccine 03/30/2021, 07/16/2022   PFIZER(Purple Top)SARS-COV-2 Vaccination 12/19/2019, 01/09/2020, 08/05/2020   Pneumococcal Conjugate-13 05/18/2019   Pneumococcal Polysaccharide-23 01/30/2009, 06/02/2020   Td 03/17/2004   Tdap 09/09/2014   Zoster Recombinant(Shingrix) 08/07/2019, 10/28/2019    TDAP status: Up to date  Flu Vaccine status: Due, Education has been provided regarding the importance of this vaccine. Advised may receive this vaccine at local pharmacy or Health Dept. Aware to provide a copy of the vaccination record if obtained from local pharmacy or Health Dept. Verbalized acceptance and understanding.  Pneumococcal vaccine status: Up to date  Covid-19 vaccine status: Information provided on how to obtain vaccines.   Qualifies for Shingles Vaccine? No   Zostavax  completed Yes   Shingrix Completed?: Yes  Screening Tests Health Maintenance  Topic Date Due   FOOT EXAM  06/02/2021   OPHTHALMOLOGY EXAM  05/28/2023   COVID-19 Vaccine (6 - 2023-24 season) 09/15/2023 (Originally 06/12/2023)   INFLUENZA VACCINE  01/09/2024 (Originally 05/12/2023)   MAMMOGRAM  08/30/2024 (Originally 07/22/2023)   Diabetic kidney evaluation - Urine ACR  11/12/2023   HEMOGLOBIN A1C  11/12/2023   Colonoscopy  05/08/2024   Diabetic kidney evaluation - eGFR measurement  05/11/2024   DEXA SCAN  07/28/2024   Medicare Annual Wellness (AWV)  08/29/2024   DTaP/Tdap/Td (3 - Td or Tdap) 09/09/2024   Pneumonia Vaccine 39+ Years old  Completed   Hepatitis C Screening  Completed   Zoster Vaccines- Shingrix  Completed   HPV VACCINES  Aged Out    Health Maintenance  Health Maintenance Due  Topic Date Due   FOOT EXAM  06/02/2021   OPHTHALMOLOGY EXAM  05/28/2023    Colorectal cancer screening: Type of screening: Colonoscopy. Completed 2015. Repeat every 10 years  Mammogram status: Completed  . Repeat every year  Bone Density status: Completed  . Results reflect: Bone density results: OSTEOPENIA. Repeat every 3 years.  Lung Cancer Screening: (Low Dose CT Chest recommended if Age 78-80 years, 20 pack-year currently smoking OR have quit w/in 15years.) does not qualify.   Lung Cancer Screening Referral:   Additional Screening:  Hepatitis C Screening: does not qualify; Completed 2022  Vision Screening: Recommended annual ophthalmology exams for early detection of glaucoma and other disorders of the eye. Is the patient up to date with their annual eye exam?  Yes  Who is the provider or what is the name of the office in which the patient attends annual eye exams? [facility] If pt is not established with a provider, would they like to be referred to a provider to establish care? No .   Dental Screening: Recommended annual dental exams for proper oral hygiene  Nutrition Risk  Assessment:  Has the patient had any N/V/D within the last 2 months?  No  Does the patient have any non-healing wounds?  No  Has the patient had any unintentional weight loss or weight gain?  No   Diabetes:  Is the patient diabetic?  Yes  If diabetic, was a CBG obtained today?  No  Did the patient bring in their glucometer from home?  No  How often do you monitor your CBG's? 1 -2 times a week.   Financial Strains and Diabetes Management:  Are you having any financial strains with the device, your supplies or your medication? No .  Does the patient want to be seen by  Chronic Care Management for management of their diabetes?  No  Would the patient like to be referred to a Nutritionist or for Diabetic Management?  No   Diabetic Exams:  Diabetic Eye Exam  Pt has been advised about the importance in completing this exam  Diabetic Foot Exam:  Pt has been advised about the importance in completing this exam.   Community Resource Referral / Chronic Care Management: CRR required this visit?  No   CCM required this visit?  No     Plan:     I have personally reviewed and noted the following in the patient's chart:   Medical and social history Use of alcohol, tobacco or illicit drugs  Current medications and supplements including opioid prescriptions. Patient is not currently taking opioid prescriptions. Functional ability and status Nutritional status Physical activity Advanced directives List of other physicians Hospitalizations, surgeries, and ER visits in previous 12 months Vitals Screenings to include cognitive, depression, and falls Referrals and appointments  In addition, I have reviewed and discussed with patient certain preventive protocols, quality metrics, and best practice recommendations. A written personalized care plan for preventive services as well as general preventive health recommendations were provided to patient.     Remi Haggard, LPN   [id number]    After Visit Summary: (MyChart) Due to this being a telephonic visit, the after visit summary with patients personalized plan was offered to patient via MyChart   Nurse Notes:

## 2023-09-21 ENCOUNTER — Ambulatory Visit (INDEPENDENT_AMBULATORY_CARE_PROVIDER_SITE_OTHER): Payer: Medicare Other | Admitting: Family Medicine

## 2023-09-21 VITALS — BP 118/78 | HR 81 | Ht 66.0 in

## 2023-09-21 DIAGNOSIS — M7062 Trochanteric bursitis, left hip: Secondary | ICD-10-CM | POA: Diagnosis not present

## 2023-09-21 DIAGNOSIS — M7061 Trochanteric bursitis, right hip: Secondary | ICD-10-CM

## 2023-09-21 DIAGNOSIS — M25561 Pain in right knee: Secondary | ICD-10-CM

## 2023-09-21 DIAGNOSIS — Z96651 Presence of right artificial knee joint: Secondary | ICD-10-CM

## 2023-09-21 DIAGNOSIS — G8929 Other chronic pain: Secondary | ICD-10-CM | POA: Diagnosis not present

## 2023-09-21 NOTE — Progress Notes (Unsigned)
[name] [address] [place] [address] Phone: [phone] Subjective:   Erica [name], am serving as a scribe for Dr. Antoine Primas.  I'm seeing this patient by the request  of:  Pincus Sanes, MD  CC: Left hip and right knee pain  [id number]  06/07/2023 Chronic problem with worsening symptoms.  Did not respond well to the injection greater than 7 months nearly 8 months ago.  Okay for that we will get at least this type of improvement.  Encourage patient to continue to work on weight loss.  Get BMI under 30 would be ideal.  Will continue to monitor patient's other ailments.  Will follow-up again in 8 weeks otherwise.   Updated 09/21/2023 Erica [name] is a 67 y.o. female coming in with complaint of hip pain. Handicap decal. L hip and R knee pain. No other concerns.       Past Medical History:  Diagnosis Date   Abnormal stress test    a. 09/2016: NST showed a small defect of mild severity present in the basal inferoseptal and mid inferoseptal location, consistent with ischemia. --> medically managed   ALLERGIC RHINITIS    Anxiety    Bursitis    DDD (degenerative disc disease), lumbar    ESI with Ramos (spring 2016)   Depression    Diabetes mellitus without complication (HCC)    Type II   Dyslipidemia    External hemorrhoids    GERD (gastroesophageal reflux disease)    Hypertension    Obesity    Osteoarthritis    Palpitations    a. prior event monitor showing sinus tachycardia, no PAF.    Panic attacks    Hx of depression   Sleep apnea    No Cpap   Past Surgical History:  Procedure Laterality Date   ABDOMINAL HYSTERECTOMY     DILATION AND CURETTAGE OF UTERUS     MASS EXCISION Left 12/30/2015   Procedure: EXCISION LEFT LEG MASS;  Surgeon: Manus Rudd, MD;  Location: [facility];  Service: General;  Laterality: Left;   NASAL TURBINATE REDUCTION Bilateral 03/04/2022   Procedure: TURBINATE REDUCTION;   Surgeon: Christia Reading, MD;  Location[facility];  Service: ENT;  Laterality: Bilateral;   REPLACEMENT TOTAL KNEE Right    RIGHT OOPHORECTOMY     No cancer   Social History   Socioeconomic History   Marital status: Married    Spouse name: Hessie Knows   Number of children: 3   Years of education: Not on file   Highest education level: Not on file  Occupational History   Occupation: Disabled  Tobacco Use   Smoking status: Never   Smokeless tobacco: Never   Tobacco comments:    tried to as a teenager  Vaping Use   Vaping status: Never Used  Substance and Sexual Activity   Alcohol use: Yes    Alcohol/week: 1.0 standard drink of alcohol    Types: 1 Glasses of wine per week    Comment: one drink a month   Drug use: No   Sexual activity: Not Currently  Other Topics Concern   Not on file  Social History Narrative   Married with children. Pt is on disablity. Previous worked in the school system   Social Drivers of Corporate investment banker Strain: Low Risk  (08/30/2023)   Overall Financial Resource Strain (CARDIA)    Difficulty of Paying Living Expenses: Not hard at all  Food  Insecurity: No Food Insecurity (08/30/2023)   Hunger Vital Sign    Worried About Running Out of Food in the Last Year: Never true    Ran Out of Food in the Last Year: Never true  Transportation Needs: No Transportation Needs (08/30/2023)   PRAPARE - Administrator, Civil Service (Medical): No    Lack of Transportation (Non-Medical): No  Physical Activity: Inactive (08/30/2023)   Exercise Vital Sign    Days of Exercise per Week: 0 days    Minutes of Exercise per Session: 0 min  Stress: Stress Concern Present (08/30/2023)   [employer] of Occupational Health - Occupational Stress Questionnaire    Feeling of Stress : To some extent  Social Connections: Moderately Integrated (08/30/2023)   Social Connection and Isolation Panel [NHANES]    Frequency of Communication with Friends and Family: More  than three times a week    Frequency of Social Gatherings with Friends and Family: Twice a week    Attends Religious Services: More than 4 times per year    Active Member of [employer] or Organizations: No    Attends Banker Meetings: Never    Marital Status: Married   Allergies  Allergen Reactions   Seroquel [Quetiapine Fumarate] Other (See Comments)    Hallucinations, palpitations   Codeine Nausea And Vomiting   Guaifenesin-Codeine Other (See Comments)     feels spacey   Wellbutrin [Bupropion] Palpitations    hallucinations   Family History  Problem Relation Age of Onset   Heart disease Mother        Enlarged Heart, Pacemaker   Diabetes Mother    Thyroid disease Mother    Hyperlipidemia Mother    Hypertension Mother    Sleep apnea Mother    Dementia Father    Kidney failure Father    Prostate cancer Father    Alcoholism Father    Asthma Other    Allergies Sister    Asthma Son    Breast cancer Maternal Aunt        cousin   Colon cancer Cousin    Heart disease Son        CHF, morbid obesity   Prostate cancer Maternal Uncle    Diabetes Son     Current Outpatient Medications (Endocrine & Metabolic):    Semaglutide (RYBELSUS) 14 MG TABS, Take 1 tablet (14 mg total) by mouth daily.  Current Outpatient Medications (Cardiovascular):    atenolol (TENORMIN) 25 MG tablet, TAKE ONE TABLET BY MOUTH IN THE MORNING AND TAKE TWO TABLETS IN THE EVENING   CRESTOR 20 MG tablet, TAKE ONE TABLET BY MOUTH EVERY DAY   furosemide (LASIX) 40 MG tablet, Take 1 tablet (40 mg total) by mouth daily.   propranolol (INDERAL) 10 MG tablet, Take 1 tablet (10 mg total) by mouth 3 (three) times daily as needed (PALPITATIONS).  Current Outpatient Medications (Respiratory):    albuterol (VENTOLIN HFA) 108 (90 Base) MCG/ACT inhaler, Inhale 2 puffs into the lungs every 6 (six) hours as needed for wheezing or shortness of breath.   fluticasone (FLONASE) 50 MCG/ACT nasal spray, Place 2 sprays  into both nostrils daily.   levocetirizine (XYZAL) 5 MG tablet, Take 1 tablet (5 mg total) by mouth every evening.  Current Outpatient Medications (Analgesics):    SUMAtriptan-naproxen (TREXIMET) 85-500 MG tablet, Take 1 pill as needed for migraine, if migraine persists may repeat x1 after 2 hours  Current Outpatient Medications (Hematological):    cyanocobalamin (,VITAMIN B-12,) 1000  MCG/ML injection, Inject 1,000 mcg into the muscle every 30 (thirty) days.  Current Outpatient Medications (Other):    clonazePAM (KLONOPIN) 0.5 MG tablet, Take 0.5 mg by mouth 2 (two) times daily as needed.   cyclobenzaprine (FLEXERIL) 5 MG tablet, Take 1 tablet (5 mg total) by mouth at bedtime.   gabapentin (NEURONTIN) 100 MG capsule, Take 1 capsule (100 mg total) by mouth at bedtime.   hydrOXYzine (ATARAX) 25 MG tablet, Take 25 mg by mouth 3 (three) times daily as needed for itching (allergies).   LINZESS 290 MCG CAPS capsule, TAKE ONE CAPSULE BY MOUTH EVERY DAY BEFORE BREAKFAST   Multiple Vitamins-Minerals (MULTIVITAMIN WITH MINERALS) tablet, Take 1 tablet by mouth daily.   neomycin-polymyxin-hydrocortisone (CORTISPORIN) OTIC solution, Apply one to two drops to toe after soaking twice daily.   omeprazole (PRILOSEC) 20 MG capsule, Take 1 capsule (20 mg total) by mouth 2 (two) times daily as needed (take 30 minutes prior to a meal).   potassium chloride SA (KLOR-CON M) 20 MEQ tablet, Take 1 tablet (20 mEq total) by mouth daily. 1 tablet by mouth daily   Tart Cherry (TART CHERRY ULTRA) 1200 MG CAPS, Take 1 capsule by mouth every evening.   TURMERIC PO, Take 1 capsule by mouth daily. Vita b-12  & b-6   valACYclovir (VALTREX) 500 MG tablet, Take 500 mg by mouth 2 (two) times daily.   venlafaxine XR (EFFEXOR-XR) 150 MG 24 hr capsule, Take 1 capsule (150 mg total) by mouth 2 (two) times daily.   Vitamin D, Ergocalciferol, (DRISDOL) 1.25 MG (50000 UNIT) CAPS capsule, TAKE ONE CAPSULE BY MOUTH EVERY SEVEN DAYS (MAY  CONTAIN SOY OR PEANUT--TALK TO YOUR DOCTOR BEFORE TAKING IF YOU ARE ALLERGIC)   Reviewed prior external information including notes and imaging from  primary care provider As well as notes that were available from care everywhere and other healthcare systems.  Past medical history, social, surgical and family history all reviewed in electronic medical record.  No pertanent information unless stated regarding to the chief complaint.   Review of Systems:  No headache, visual changes, nausea, vomiting, diarrhea, constipation, dizziness, abdominal pain, skin rash, fevers, chills, night sweats, weight loss, swollen lymph nodes, body aches, joint swelling, chest pain, shortness of breath, mood changes. POSITIVE muscle aches  Objective  Blood pressure 118/78, pulse 81, height 5\' 6"  (1.676 m), SpO2 98%.   General: No apparent distress alert and oriented x3 mood and affect normal, dressed appropriately.  HEENT: Pupils equal, extraocular movements intact  Respiratory: Patient's speak in full sentences and does not appear short of breath  Cardiovascular: Trace lower extremity edema, non tender, no erythema   Antalgic gait noted. Left knee does have postsurgical changes.  Audible popping.  Instability with valgus and varus force.  Bilateral hips do have tenderness to palpation over the greater trochanteric area.  Positive FABER test right greater than left. Negative straight leg test but does have some loss of lordosis of the back.   After verbal consent patient was prepped with alcohol swab and with a 21-gauge 2 inch needle injected into the right greater trochanteric area with 2 cc of 0.5% Marcaine and 1 cc of Kenalog 40 mg/mL.  No blood loss.  Band-Aid placed.  Postinjection instructions given   After verbal consent patient was prepped with alcohol swab and with a 21-gauge 2 inch needle injected into the left greater trochanteric area with 2 cc of 0.5% Marcaine and 1 cc of Kenalog 40 mg/mL.  No  blood loss.  Band-Aid placed.  Postinjection instructions given   Impression and Recommendations:    The above documentation has been reviewed and is accurate and complete Judi Saa, DO

## 2023-09-21 NOTE — Patient Instructions (Signed)
Injection in both hips today Vics on the nail nightly Let us know we can send in dermatology referral See you again in 2-3 months

## 2023-09-22 ENCOUNTER — Encounter: Payer: Self-pay | Admitting: Family Medicine

## 2023-09-22 NOTE — Assessment & Plan Note (Signed)
Continues to have chronic difficulty.  May have some more increase in the stability of the knee.  Will get a bone scan to further evaluate.  Had 1 in 2019 showing mild increase in uptake but we did think it was more postsurgical.  Will see if any significant worsening has happened.

## 2023-09-22 NOTE — Assessment & Plan Note (Signed)
Chronic problem with worsening symptoms again.  Discussed icing regimen and home exercises, which activities to do and which ones to avoid.  Increase activity slowly otherwise.  Discussed home exercises and icing regimen.  Follow-up with me again in 4 to 6 weeks otherwise.

## 2023-10-10 ENCOUNTER — Encounter (HOSPITAL_COMMUNITY)
Admission: RE | Admit: 2023-10-10 | Discharge: 2023-10-10 | Disposition: A | Discharge status: Home / Self Care | Payer: Medicare Other | Source: Ambulatory Visit | Attending: Family Medicine | Admitting: Family Medicine

## 2023-10-10 DIAGNOSIS — G8929 Other chronic pain: Secondary | ICD-10-CM | POA: Insufficient documentation

## 2023-10-10 DIAGNOSIS — Z96651 Presence of right artificial knee joint: Secondary | ICD-10-CM | POA: Insufficient documentation

## 2023-10-10 DIAGNOSIS — M25561 Pain in right knee: Secondary | ICD-10-CM | POA: Insufficient documentation

## 2023-10-10 MED ORDER — TECHNETIUM TC 99M MEDRONATE IV KIT
21.0000 | PACK | Freq: Once | INTRAVENOUS | Status: AC | PRN
  Administered 2023-10-10: 21 via INTRAVENOUS

## 2023-10-13 ENCOUNTER — Other Ambulatory Visit: Payer: Self-pay | Admitting: Internal Medicine

## 2023-10-13 DIAGNOSIS — E1169 Type 2 diabetes mellitus with other specified complication: Secondary | ICD-10-CM

## 2023-10-13 DIAGNOSIS — H25813 Combined forms of age-related cataract, bilateral: Secondary | ICD-10-CM | POA: Diagnosis not present

## 2023-10-13 DIAGNOSIS — J301 Allergic rhinitis due to pollen: Secondary | ICD-10-CM

## 2023-10-13 DIAGNOSIS — E119 Type 2 diabetes mellitus without complications: Secondary | ICD-10-CM | POA: Diagnosis not present

## 2023-10-13 DIAGNOSIS — H40013 Open angle with borderline findings, low risk, bilateral: Secondary | ICD-10-CM | POA: Diagnosis not present

## 2023-10-26 ENCOUNTER — Encounter: Payer: Self-pay | Admitting: Family Medicine

## 2023-11-01 ENCOUNTER — Other Ambulatory Visit: Payer: Self-pay | Admitting: Internal Medicine

## 2023-11-01 ENCOUNTER — Other Ambulatory Visit: Payer: Self-pay

## 2023-11-01 DIAGNOSIS — G8929 Other chronic pain: Secondary | ICD-10-CM

## 2023-11-01 MED ORDER — ATENOLOL 25 MG PO TABS: ORAL_TABLET | ORAL | 1 refills | Status: DC

## 2023-11-01 NOTE — Telephone Encounter (Signed)
Copied from CRM [id number]. Topic: Clinical - Medication Refill >> Nov 01, 2023  2:55 PM Adele Barthel wrote: Most Recent Primary Care Visit:  Provider: Laurey Arrow  Department: [facility]  Visit Type: MEDICARE AWV, SEQUENTIAL  Date: 08/30/2023  Medication: atenolol (TENORMIN) 25 MG tablet   Has the patient contacted their pharmacy? Yes (Agent: If no, request that the patient contact the pharmacy for the refill. If patient does not wish to contact the pharmacy document the reason why and proceed with request.) (Agent: If yes, when and what did the pharmacy advise?)  Is this the correct pharmacy for this prescription? Yes If no, delete pharmacy and type the correct one.  This is the patient's preferred pharmacy:  [facility] [address], [place] - [address] [place] [address] Phone: [phone] Fax: [phone]   Has the prescription been filled recently? Yes  Is the patient out of the medication? Yes  Has the patient been seen for an appointment in the last year OR does the patient have an upcoming appointment? Yes  Can we respond through MyChart? Yes  Agent: Please be advised that Rx refills may take up to 3 business days. We ask that you follow-up with your pharmacy.

## 2023-11-10 DIAGNOSIS — Z1231 Encounter for screening mammogram for malignant neoplasm of breast: Secondary | ICD-10-CM | POA: Diagnosis not present

## 2023-11-10 LAB — HM MAMMOGRAPHY

## 2023-11-11 DIAGNOSIS — M25552 Pain in left hip: Secondary | ICD-10-CM | POA: Diagnosis not present

## 2023-11-11 DIAGNOSIS — M25561 Pain in right knee: Secondary | ICD-10-CM | POA: Diagnosis not present

## 2023-11-11 DIAGNOSIS — M25551 Pain in right hip: Secondary | ICD-10-CM | POA: Diagnosis not present

## 2023-11-13 NOTE — Patient Instructions (Addendum)
 Flu immunization administered today.   B12 injection today.     Blood work was ordered.       Medications changes include :   None   A referral was ordered for R. Niels Bless, M.D at [facility].    Return in about 6 months (around 05/14/2024) for follow up.   Health Maintenance, Female Adopting a healthy lifestyle and getting preventive care are important in promoting health and wellness. Ask your health care provider about: The right schedule for you to have regular tests and exams. Things you can do on your own to prevent diseases and keep yourself healthy. What should I know about diet, weight, and exercise? Eat a healthy diet  Eat a diet that includes plenty of vegetables, fruits, low-fat dairy products, and lean protein. Do not eat a lot of foods that are high in solid fats, added sugars, or sodium. Maintain a healthy weight Body mass index (BMI) is used to identify weight problems. It estimates body fat based on height and weight. Your health care provider can help determine your BMI and help you achieve or maintain a healthy weight. Get regular exercise Get regular exercise. This is one of the most important things you can do for your health. Most adults should: Exercise for at least 150 minutes each week. The exercise should increase your heart rate and make you sweat (moderate-intensity exercise). Do strengthening exercises at least twice a week. This is in addition to the moderate-intensity exercise. Spend less time sitting. Even light physical activity can be beneficial. Watch cholesterol and blood lipids Have your blood tested for lipids and cholesterol at 70 years of age, then have this test every 5 years. Have your cholesterol levels checked more often if: Your lipid or cholesterol levels are high. You are older than 70 years of age. You are at high risk for heart disease. What should I know about cancer screening? Depending on your health  history and family history, you may need to have cancer screening at various ages. This may include screening for: Breast cancer. Cervical cancer. Colorectal cancer. Skin cancer. Lung cancer. What should I know about heart disease, diabetes, and high blood pressure? Blood pressure and heart disease High blood pressure causes heart disease and increases the risk of stroke. This is more likely to develop in people who have high blood pressure readings or are overweight. Have your blood pressure checked: Every 3-5 years if you are 12-74 years of age. Every year if you are 68 years old or older. Diabetes Have regular diabetes screenings. This checks your fasting blood sugar level. Have the screening done: Once every three years after age 49 if you are at a normal weight and have a low risk for diabetes. More often and at a younger age if you are overweight or have a high risk for diabetes. What should I know about preventing infection? Hepatitis B If you have a higher risk for hepatitis B, you should be screened for this virus. Talk with your health care provider to find out if you are at risk for hepatitis B infection. Hepatitis C Testing is recommended for: Everyone born from [birth year] through 1967. Anyone with known risk factors for hepatitis C. Sexually transmitted infections (STIs) Get screened for STIs, including gonorrhea and chlamydia, if: You are sexually active and are younger than 70 years of age. You are older than 70 years of age and your health care provider tells you that you are at risk for  this type of infection. Your sexual activity has changed since you were last screened, and you are at increased risk for chlamydia or gonorrhea. Ask your health care provider if you are at risk. Ask your health care provider about whether you are at high risk for HIV. Your health care provider may recommend a prescription medicine to help prevent HIV infection. If you choose to take medicine to  prevent HIV, you should first get tested for HIV. You should then be tested every 3 months for as long as you are taking the medicine. Pregnancy If you are about to stop having your period (premenopausal) and you may become pregnant, seek counseling before you get pregnant. Take 400 to 800 micrograms (mcg) of folic acid every day if you become pregnant. Ask for birth control (contraception) if you want to prevent pregnancy. Osteoporosis and menopause Osteoporosis is a disease in which the bones lose minerals and strength with aging. This can result in bone fractures. If you are 14 years old or older, or if you are at risk for osteoporosis and fractures, ask your health care provider if you should: Be screened for bone loss. Take a calcium  or vitamin D  supplement to lower your risk of fractures. Be given hormone replacement therapy (HRT) to treat symptoms of menopause. Follow these instructions at home: Alcohol use Do not drink alcohol if: Your health care provider tells you not to drink. You are pregnant, may be pregnant, or are planning to become pregnant. If you drink alcohol: Limit how much you have to: 0-1 drink a day. Know how much alcohol is in your drink. In the [place] one drink equals one 12 oz bottle of beer (355 mL), one 5 oz glass of wine (148 mL), or one 1 oz glass of hard liquor (44 mL). Lifestyle Do not use any products that contain nicotine or tobacco. These products include cigarettes, chewing tobacco, and vaping devices, such as e-cigarettes. If you need help quitting, ask your health care provider. Do not use street drugs. Do not share needles. Ask your health care provider for help if you need support or information about quitting drugs. General instructions Schedule regular health, dental, and eye exams. Stay current with your vaccines. Tell your health care provider if: You often feel depressed. You have ever been abused or do not feel safe at  home. Summary Adopting a healthy lifestyle and getting preventive care are important in promoting health and wellness. Follow your health care provider's instructions about healthy diet, exercising, and getting tested or screened for diseases. Follow your health care provider's instructions on monitoring your cholesterol and blood pressure. This information is not intended to replace advice given to you by your health care provider. Make sure you discuss any questions you have with your health care provider. Document Revised: 02/16/2021 Document Reviewed: 02/16/2021 Elsevier Patient Education  2024 [employer].

## 2023-11-13 NOTE — Progress Notes (Signed)
 Subjective:    Patient ID: Erica [name], female    DOB: Feb 11, 1956, 68 y.o.   MRN: [id number]      HPI Erica [name] is here for a Physical exam and her chronic medical problems.   Has chronic pain - seeing sports medicine.  She has knee pain from osteoarthritis and chronic back pain.  Overall she feels like she is doing okay with the pain.    Medications and allergies reviewed with patient and updated if appropriate.  Current Outpatient Medications on File Prior to Visit  Medication Sig Dispense Refill   albuterol  (VENTOLIN  HFA) 108 (90 Base) MCG/ACT inhaler Inhale 2 puffs into the lungs every 6 (six) hours as needed for wheezing or shortness of breath. 16 g 3   atenolol  (TENORMIN ) 25 MG tablet TAKE ONE TABLET BY MOUTH IN THE MORNING AND TAKE TWO TABLETS IN THE EVENING 270 tablet 1   clonazePAM (KLONOPIN) 0.5 MG tablet Take 0.5 mg by mouth 2 (two) times daily as needed.     CRESTOR  20 MG tablet TAKE ONE TABLET BY MOUTH EVERY DAY 90 tablet 2   cyanocobalamin  (,VITAMIN B-12,) 1000 MCG/ML injection Inject 1,000 mcg into the muscle every 30 (thirty) days.     cyclobenzaprine  (FLEXERIL ) 5 MG tablet Take 1 tablet (5 mg total) by mouth at bedtime. 30 tablet 1   fluticasone  (FLONASE ) 50 MCG/ACT nasal spray Place 2 sprays into both nostrils daily. 16 g 11   furosemide  (LASIX ) 40 MG tablet TAKE ONE TABLET BY MOUTH EVERY DAY 90 tablet 2   gabapentin  (NEURONTIN ) 100 MG capsule Take 1 capsule (100 mg total) by mouth at bedtime. 90 capsule 0   hydrOXYzine  (ATARAX ) 25 MG tablet Take 25 mg by mouth 3 (three) times daily as needed for itching (allergies).     levocetirizine (XYZAL ) 5 MG tablet TAKE ONE TABLET BY MOUTH EVERY DAY IN THE EVENING 90 tablet 2   LINZESS  290 MCG CAPS capsule TAKE ONE CAPSULE BY MOUTH EVERY DAY BEFORE BREAKFAST 90 capsule 2   Multiple Vitamins-Minerals (MULTIVITAMIN WITH MINERALS) tablet Take 1 tablet by mouth daily.     omeprazole  (PRILOSEC) 20 MG capsule Take 1  capsule (20 mg total) by mouth 2 (two) times daily as needed (take 30 minutes prior to a meal). 180 capsule 2   potassium chloride  SA (KLOR-CON  M) 20 MEQ tablet Take 1 tablet (20 mEq total) by mouth daily. 1 tablet by mouth daily 90 tablet 3   propranolol  (INDERAL ) 10 MG tablet Take 1 tablet (10 mg total) by mouth 3 (three) times daily as needed (PALPITATIONS). 60 tablet 3   RYBELSUS  14 MG TABS TAKE ONE TABLET BY MOUTH EVERY DAY - (TAKE WITH NO MORE THAN 4 OUNCES OF PLAIN WATER AT LEAST 30 MINUTES BEFORE THE FIRST FOOD, BEVERAGE, OR OTHER MEDICATIONS OF THE DAY) 90 tablet 2   SUMAtriptan -naproxen  (TREXIMET ) 85-500 MG tablet Take 1 pill as needed for migraine, if migraine persists may repeat x1 after 2 hours 10 tablet 8   Tart Cherry (TART CHERRY ULTRA) 1200 MG CAPS Take 1 capsule by mouth every evening.     TURMERIC PO Take 1 capsule by mouth daily. Vita b-12  & b-6     valACYclovir  (VALTREX ) 500 MG tablet Take 500 mg by mouth 2 (two) times daily.     venlafaxine  XR (EFFEXOR -XR) 150 MG 24 hr capsule Take 1 capsule (150 mg total) by mouth 2 (two) times daily. 180 capsule 2   Vitamin D ,  Ergocalciferol , (DRISDOL ) 1.25 MG (50000 UNIT) CAPS capsule TAKE ONE CAPSULE BY MOUTH EVERY SEVEN DAYS (MAY CONTAIN SOY OR PEANUT--TALK TO YOUR DOCTOR BEFORE TAKING IF YOU ARE ALLERGIC) 12 capsule 0   [DISCONTINUED] oxycodone  (OXY-IR) 5 MG capsule Take 5 mg by mouth every 4 (four) hours as needed. For pain.     No current facility-administered medications on file prior to visit.    Review of Systems  Constitutional:  Negative for fever.  Eyes:  Positive for visual disturbance (catarcts).  Respiratory:  Negative for cough, shortness of breath and wheezing.   Cardiovascular:  Positive for palpitations (often wakes her up). Negative for chest pain and leg swelling.  Gastrointestinal:  Positive for constipation and nausea. Negative for abdominal pain, blood in stool and diarrhea.       No gerd  Genitourinary:   Negative for dysuria.  Musculoskeletal:  Positive for arthralgias and back pain.  Skin:  Negative for rash.  Neurological:  Positive for headaches (? sinus - when she wakes up - goes away). Negative for light-headedness.  Psychiatric/Behavioral:  Negative for dysphoric mood. The patient is not nervous/anxious.        Objective:   Vitals:   11/15/23 1009  BP: 116/82  Pulse: 68  Temp: 98.6 F (37 C)  SpO2: 98%   Filed Weights   11/15/23 1009  Weight: 217 lb (98.4 kg)   Body mass index is 35.02 kg/m.  BP Readings from Last 3 Encounters:  11/15/23 116/82  11/14/23 122/84  09/21/23 118/78    Wt Readings from Last 3 Encounters:  11/15/23 217 lb (98.4 kg)  11/14/23 218 lb 6.4 oz (99.1 kg)  07/05/23 227 lb (103 kg)       Physical Exam Constitutional: She appears well-developed and well-nourished. No distress.  HENT:  Head: Normocephalic and atraumatic.  Right Ear: External ear normal. Normal ear canal and TM Left Ear: External ear normal.  Normal ear canal and TM Mouth/Throat: Oropharynx is clear and moist.  Eyes: Conjunctivae normal.  Neck: Neck supple. No tracheal deviation present. No thyromegaly present.  No carotid bruit  Cardiovascular: Normal rate, regular rhythm and normal heart sounds.   No murmur heard.  No edema. Pulmonary/Chest: Effort normal and breath sounds normal. No respiratory distress. She has no wheezes. She has no rales.  Breast: deferred   Abdominal: Soft. She exhibits no distension. There is no tenderness.  Lymphadenopathy: She has no cervical adenopathy.  Skin: Skin is warm and dry. She is not diaphoretic.  Psychiatric: She has a normal mood and affect. Her behavior is normal.     Lab Results  Component Value Date   WBC 7.3 05/12/2023   HGB 12.4 05/12/2023   HCT 38.5 05/12/2023   PLT 293.0 05/12/2023   GLUCOSE 100 (H) 05/12/2023   CHOL 144 05/12/2023   TRIG 112.0 05/12/2023   HDL 53.60 05/12/2023   LDLCALC 68 05/12/2023   ALT 9  05/12/2023   AST 13 05/12/2023   NA 140 05/12/2023   K 4.2 05/12/2023   CL 105 05/12/2023   CREATININE 0.79 05/12/2023   BUN 17 05/12/2023   CO2 31 05/12/2023   TSH 2.26 02/02/2021   HGBA1C 5.9 05/12/2023   MICROALBUR 1.3 11/11/2022         Assessment & Plan:   Physical exam: Screening blood work  ordered Exercise   Weight   Substance abuse  none   Reviewed recommended immunizations.   Health Maintenance  Topic Date Due  FOOT EXAM  06/02/2021   OPHTHALMOLOGY EXAM  05/28/2023   COVID-19 Vaccine (6 - 2024-25 season) 06/12/2023   Diabetic kidney evaluation - Urine ACR  11/12/2023   HEMOGLOBIN A1C  11/12/2023   INFLUENZA VACCINE  01/09/2024 (Originally 05/12/2023)   Colonoscopy  05/08/2024   Diabetic kidney evaluation - eGFR measurement  05/11/2024   DEXA SCAN  07/28/2024   Medicare Annual Wellness (AWV)  08/29/2024   DTaP/Tdap/Td (3 - Td or Tdap) 09/09/2024   MAMMOGRAM  11/09/2024   Pneumonia Vaccine 33+ Years old  Completed   Hepatitis C Screening  Completed   Zoster Vaccines- Shingrix  Completed   HPV VACCINES  Aged Out          See Problem List for Assessment and Plan of chronic medical problems.

## 2023-11-14 ENCOUNTER — Other Ambulatory Visit: Payer: Self-pay | Admitting: Internal Medicine

## 2023-11-14 ENCOUNTER — Ambulatory Visit (HOSPITAL_BASED_OUTPATIENT_CLINIC_OR_DEPARTMENT_OTHER): Payer: Medicare Other | Admitting: Pulmonary Disease

## 2023-11-14 ENCOUNTER — Encounter (HOSPITAL_BASED_OUTPATIENT_CLINIC_OR_DEPARTMENT_OTHER): Payer: Self-pay | Admitting: Pulmonary Disease

## 2023-11-14 VITALS — BP 122/84 | HR 102 | Ht 66.0 in | Wt 218.4 lb

## 2023-11-14 DIAGNOSIS — M25561 Pain in right knee: Secondary | ICD-10-CM | POA: Diagnosis not present

## 2023-11-14 DIAGNOSIS — G4733 Obstructive sleep apnea (adult) (pediatric): Secondary | ICD-10-CM | POA: Diagnosis not present

## 2023-11-14 NOTE — Progress Notes (Signed)
Subjective:    Patient ID: Erica [name], female    DOB: October 27, 1955, 68 y.o.   MRN: [id number]  HPI   67yo never smoker for follow-up of OSA  PMH -osteoarthritis, diabetes, panic attacks turbinate reduction surgery by Dr. Jenne Pane 02/2022   OSA diagnosed in 2011 was poorly compliant, tried CPAP again in 2017 and could not tolerate, obtained oral appliance but follow-up home study showed that OSA was not completely treated. Tried  back on autoCPAP in 04/2017 & again in 2020 but cou now almost Augustld not tolerate  Discussed the use of AI scribe software for clinical note transcription with the patient, who gave verbal consent to proceed.  History of Present Illness   The patient, with a history of sleep apnea, presents with a swollen knee. She expresses concern about a potential infection in the knee, although there is no mention of redness or other signs of infection. The swelling has been present for twenty years but has recently decreased in size.  Regarding her sleep apnea, the patient has tried various treatments, including a dental device and CPAP, but has not found them effective. She reports poor sleep quality and fatigue, which is impacting her daily activities. The patient also mentions a significant weight loss, from 260 lbs to 218 lbs, which she attributes to her medication, Rybelsus.  The patient has concerns about a potential implant treatment for her sleep apnea, particularly in relation to the possibility of needing a pacemaker in the future. She also expresses hesitation about the invasiveness of the procedure.        Significant tests/ events reviewed   05/2022 HST >> severe OSA, AHI 34/h, low saturation of 80%.   05/2019 CPAP titration >> 13 cm 11/2009  mild , AHI of 10/hr and desat as low as 83% - wt 240 lbs - BMI 39  PSG 09/2013 showed mild OSA- AHI 13/h    HST 01/2016 20/hr.     HST 03/2017 >> (with oral appliance ) >> mild OSa persists   CT head showed meningioma  & has cluster headaches  Review of Systems neg for any significant sore throat, dysphagia, itching, sneezing, nasal congestion or excess/ purulent secretions, fever, chills, sweats, unintended wt loss, pleuritic or exertional cp, hempoptysis, orthopnea pnd or change in chronic leg swelling. Also denies presyncope, palpitations, heartburn, abdominal pain, nausea, vomiting, diarrhea or change in bowel or urinary habits, dysuria,hematuria, rash, arthralgias, visual complaints, headache, numbness weakness or ataxia.     Objective:   Physical Exam  Gen. Pleasant, obese, in no distress ENT - no lesions, no post nasal drip Neck: No JVD, no thyromegaly, no carotid bruits Lungs: no use of accessory muscles, no dullness to percussion, decreased without rales or rhonchi  Cardiovascular: Rhythm regular, heart sounds  normal, no murmurs or gallops, no peripheral edema Musculoskeletal: No deformities, no cyanosis or clubbing , no tremors       Assessment & Plan:    Assessment and Plan    Obstructive Sleep Apnea (OSA) OSA with unsuccessful CPAP and dental device trials. Discussed Inspire implant, a minor surgical procedure with a remote control device. Explained risks, including potential interference with future pacemaker, but reassured that the implant is MRI compatible and placed on the right side. Shared outcome statistics: 30-40 successful implants with improved sleep quality. Patient expressed concerns but was reassured about the procedure and follow-up process. - Schedule DISE/endoscopy to evaluate airway closure - Stop Rybelsus one week prior to endoscopy - Arrange transportation  post-procedure - Plan for potential Inspire implant surgery if airway test is favorable - Coordinate with ENT surgeon for the procedure  Weight Management Patient on Rybelsus with weight loss from 260 lbs to 218 lbs. Current BMI is within the acceptable range for the [facility] implant procedure. - Continue Rybelsus  after one-week discontinuation for endoscopy - Monitor weight and BMI regularly  Knee Swelling Chronic knee swelling for 20 years with recent reduction in size. No signs of redness or acute infection. Patient concerned about potential infection. - Consider aspiration of knee fluid to check for infection if symptoms worsen  General Health Maintenance Patient is generally in good health. Recent colonoscopy in July was noted. - Continue regular follow-ups with primary care physician - Maintain current health regimen  Follow-up - Office coordinator [place] to contact patient with details - Ensure transportation is arranged for the procedure day.

## 2023-11-14 NOTE — Patient Instructions (Signed)
X we will schedule endoscopy / DISE procedure  STOP Rybelsus 1 week prior to procedure  Based on above, we will refer you to ENT

## 2023-11-14 NOTE — H&P (View-Only) (Signed)
 Subjective:    Patient ID: Erica [name], female    DOB: October 27, 1955, 68 y.o.   MRN: [id number]  HPI   68yo never smoker for follow-up of OSA  PMH -osteoarthritis, diabetes, panic attacks turbinate reduction surgery by Dr. Jenne Pane 02/2022   OSA diagnosed in 2011 was poorly compliant, tried CPAP again in 2017 and could not tolerate, obtained oral appliance but follow-up home study showed that OSA was not completely treated. Tried  back on autoCPAP in 04/2017 & again in 2020 but cou now almost Augustld not tolerate  Discussed the use of AI scribe software for clinical note transcription with the patient, who gave verbal consent to proceed.  History of Present Illness   The patient, with a history of sleep apnea, presents with a swollen knee. She expresses concern about a potential infection in the knee, although there is no mention of redness or other signs of infection. The swelling has been present for twenty years but has recently decreased in size.  Regarding her sleep apnea, the patient has tried various treatments, including a dental device and CPAP, but has not found them effective. She reports poor sleep quality and fatigue, which is impacting her daily activities. The patient also mentions a significant weight loss, from 260 lbs to 218 lbs, which she attributes to her medication, Rybelsus.  The patient has concerns about a potential implant treatment for her sleep apnea, particularly in relation to the possibility of needing a pacemaker in the future. She also expresses hesitation about the invasiveness of the procedure.        Significant tests/ events reviewed   05/2022 HST >> severe OSA, AHI 34/h, low saturation of 80%.   05/2019 CPAP titration >> 13 cm 11/2009  mild , AHI of 10/hr and desat as low as 83% - wt 240 lbs - BMI 39  PSG 09/2013 showed mild OSA- AHI 13/h    HST 01/2016 20/hr.     HST 03/2017 >> (with oral appliance ) >> mild OSa persists   CT head showed meningioma  & has cluster headaches  Review of Systems neg for any significant sore throat, dysphagia, itching, sneezing, nasal congestion or excess/ purulent secretions, fever, chills, sweats, unintended wt loss, pleuritic or exertional cp, hempoptysis, orthopnea pnd or change in chronic leg swelling. Also denies presyncope, palpitations, heartburn, abdominal pain, nausea, vomiting, diarrhea or change in bowel or urinary habits, dysuria,hematuria, rash, arthralgias, visual complaints, headache, numbness weakness or ataxia.     Objective:   Physical Exam  Gen. Pleasant, obese, in no distress ENT - no lesions, no post nasal drip Neck: No JVD, no thyromegaly, no carotid bruits Lungs: no use of accessory muscles, no dullness to percussion, decreased without rales or rhonchi  Cardiovascular: Rhythm regular, heart sounds  normal, no murmurs or gallops, no peripheral edema Musculoskeletal: No deformities, no cyanosis or clubbing , no tremors       Assessment & Plan:    Assessment and Plan    Obstructive Sleep Apnea (OSA) OSA with unsuccessful CPAP and dental device trials. Discussed Inspire implant, a minor surgical procedure with a remote control device. Explained risks, including potential interference with future pacemaker, but reassured that the implant is MRI compatible and placed on the right side. Shared outcome statistics: 30-40 successful implants with improved sleep quality. Patient expressed concerns but was reassured about the procedure and follow-up process. - Schedule DISE/endoscopy to evaluate airway closure - Stop Rybelsus one week prior to endoscopy - Arrange transportation  post-procedure - Plan for potential Inspire implant surgery if airway test is favorable - Coordinate with ENT surgeon for the procedure  Weight Management Patient on Rybelsus with weight loss from 260 lbs to 218 lbs. Current BMI is within the acceptable range for the [facility] implant procedure. - Continue Rybelsus  after one-week discontinuation for endoscopy - Monitor weight and BMI regularly  Knee Swelling Chronic knee swelling for 20 years with recent reduction in size. No signs of redness or acute infection. Patient concerned about potential infection. - Consider aspiration of knee fluid to check for infection if symptoms worsen  General Health Maintenance Patient is generally in good health. Recent colonoscopy in July was noted. - Continue regular follow-ups with primary care physician - Maintain current health regimen  Follow-up - Office coordinator [place] to contact patient with details - Ensure transportation is arranged for the procedure day.

## 2023-11-15 ENCOUNTER — Encounter: Payer: Self-pay | Admitting: Internal Medicine

## 2023-11-15 ENCOUNTER — Ambulatory Visit: Payer: Medicare Other | Admitting: Internal Medicine

## 2023-11-15 VITALS — BP 116/82 | HR 68 | Temp 98.6°F | Ht 66.0 in | Wt 217.0 lb

## 2023-11-15 DIAGNOSIS — K219 Gastro-esophageal reflux disease without esophagitis: Secondary | ICD-10-CM | POA: Diagnosis not present

## 2023-11-15 DIAGNOSIS — E1169 Type 2 diabetes mellitus with other specified complication: Secondary | ICD-10-CM

## 2023-11-15 DIAGNOSIS — Z0000 Encounter for general adult medical examination without abnormal findings: Secondary | ICD-10-CM | POA: Diagnosis not present

## 2023-11-15 DIAGNOSIS — Z23 Encounter for immunization: Secondary | ICD-10-CM | POA: Diagnosis not present

## 2023-11-15 DIAGNOSIS — G43001 Migraine without aura, not intractable, with status migrainosus: Secondary | ICD-10-CM

## 2023-11-15 DIAGNOSIS — E785 Hyperlipidemia, unspecified: Secondary | ICD-10-CM

## 2023-11-15 DIAGNOSIS — E559 Vitamin D deficiency, unspecified: Secondary | ICD-10-CM

## 2023-11-15 DIAGNOSIS — K5909 Other constipation: Secondary | ICD-10-CM

## 2023-11-15 DIAGNOSIS — E538 Deficiency of other specified B group vitamins: Secondary | ICD-10-CM

## 2023-11-15 DIAGNOSIS — Z7984 Long term (current) use of oral hypoglycemic drugs: Secondary | ICD-10-CM

## 2023-11-15 DIAGNOSIS — I10 Essential (primary) hypertension: Secondary | ICD-10-CM

## 2023-11-15 DIAGNOSIS — R21 Rash and other nonspecific skin eruption: Secondary | ICD-10-CM

## 2023-11-15 DIAGNOSIS — M8589 Other specified disorders of bone density and structure, multiple sites: Secondary | ICD-10-CM

## 2023-11-15 LAB — COMPREHENSIVE METABOLIC PANEL
ALT: 9 U/L (ref 0–35)
AST: 15 U/L (ref 0–37)
Albumin: 3.8 g/dL (ref 3.5–5.2)
Alkaline Phosphatase: 139 U/L — ABNORMAL HIGH (ref 39–117)
BUN: 15 mg/dL (ref 6–23)
CO2: 30 meq/L (ref 19–32)
Calcium: 9.6 mg/dL (ref 8.4–10.5)
Chloride: 102 meq/L (ref 96–112)
Creatinine, Ser: 0.78 mg/dL (ref 0.40–1.20)
GFR: 77.2 mL/min (ref >60.00)
Glucose, Bld: 99 mg/dL (ref 70–99)
Potassium: 3.7 meq/L (ref 3.5–5.1)
Sodium: 139 meq/L (ref 135–145)
Total Bilirubin: 0.4 mg/dL (ref 0.2–1.2)
Total Protein: 7.1 g/dL (ref 6.0–8.3)

## 2023-11-15 LAB — CBC WITH DIFFERENTIAL/PLATELET
Basophils Absolute: 0 10*3/uL (ref 0.0–0.1)
Basophils Relative: 0.5 % (ref 0.0–3.0)
Eosinophils Absolute: 0.1 10*3/uL (ref 0.0–0.7)
Eosinophils Relative: 0.9 % (ref 0.0–5.0)
HCT: 39.4 % (ref 36.0–46.0)
Hemoglobin: 12.8 g/dL (ref 12.0–15.0)
Lymphocytes Relative: 29.2 % (ref 12.0–46.0)
Lymphs Abs: 1.9 10*3/uL (ref 0.7–4.0)
MCHC: 32.4 g/dL (ref 30.0–36.0)
MCV: 86.5 fL (ref 78.0–100.0)
Monocytes Absolute: 0.4 10*3/uL (ref 0.1–1.0)
Monocytes Relative: 6.1 % (ref 3.0–12.0)
Neutro Abs: 4.1 10*3/uL (ref 1.4–7.7)
Neutrophils Relative %: 63.3 % (ref 43.0–77.0)
Platelets: 293 10*3/uL (ref 150.0–400.0)
RBC: 4.55 Mil/uL (ref 3.87–5.11)
RDW: 14.4 % (ref 11.5–15.5)
WBC: 6.5 10*3/uL (ref 4.0–10.5)

## 2023-11-15 LAB — LIPID PANEL
Cholesterol: 154 mg/dL (ref 0–200)
HDL: 58.6 mg/dL (ref >39.00)
LDL Cholesterol: 80 mg/dL (ref 0–99)
NonHDL: 95.28
Total CHOL/HDL Ratio: 3
Triglycerides: 75 mg/dL (ref 0.0–149.0)
VLDL: 15 mg/dL (ref 0.0–40.0)

## 2023-11-15 LAB — MICROALBUMIN / CREATININE URINE RATIO
Creatinine,U: 246.2 mg/dL
Microalb Creat Ratio: 0.6 mg/g (ref 0.0–30.0)
Microalb, Ur: 1.4 mg/dL (ref 0.0–1.9)

## 2023-11-15 LAB — TSH: TSH: 2.79 u[IU]/mL (ref 0.35–5.50)

## 2023-11-15 LAB — HEMOGLOBIN A1C: Hgb A1c MFr Bld: 6.1 % (ref 4.6–6.5)

## 2023-11-15 MED ORDER — CYANOCOBALAMIN 1000 MCG/ML IJ SOLN
1000.0000 ug | Freq: Once | INTRAMUSCULAR | Status: AC
  Administered 2023-11-15: 1000 ug via INTRAMUSCULAR

## 2023-11-15 MED ORDER — ONDANSETRON HCL 4 MG PO TABS: 4.0000 mg | ORAL_TABLET | Freq: Three times a day (TID) | ORAL | 1 refills | Status: AC | PRN

## 2023-11-15 NOTE — Assessment & Plan Note (Addendum)
 Chronic Not controlled Partially related to rybelsus  Continue Linzess  290 mcg daily stool softener not effective Taking a probiotic and drinking a lot of water  miralax  daily did not help much She is working on other natural things that might help more

## 2023-11-15 NOTE — Assessment & Plan Note (Signed)
Chronic GERD controlled Continue omeprazole 20 mg twice daily as needed

## 2023-11-15 NOTE — Assessment & Plan Note (Signed)
Chronic Blood pressure well controlled CMP, CBC Continue atenolol 25 mg in the morning and 50 mg in the evening

## 2023-11-15 NOTE — Assessment & Plan Note (Addendum)
 Chronic With hyperlipidemia  Lab Results  Component Value Date   HGBA1C 5.9 05/12/2023   Sugars well controlled Check A1c, urine albumin/creatinine Continue rybelsus  14 mg daily Stressed regular exercise, diabetic diet She is losing weight with the help of Rybelsus 

## 2023-11-15 NOTE — Addendum Note (Signed)
Addended by: Oretha Milch on: 11/15/2023 08:10 AM   Modules accepted: Orders

## 2023-11-15 NOTE — Assessment & Plan Note (Signed)
Chronic Infrequent migraines Continue Treximet as needed 

## 2023-11-15 NOTE — Addendum Note (Signed)
Addended by: Karma Ganja on: 11/15/2023 03:44 PM   Modules accepted: Orders

## 2023-11-15 NOTE — Assessment & Plan Note (Signed)
Chronic DEXA up-to-date Encouraged regular exercise Continue multivitamin Continue vitamin D supplementation

## 2023-11-15 NOTE — Assessment & Plan Note (Addendum)
Chronic Treatment with monthly B12 injections B12 injection today

## 2023-11-15 NOTE — Assessment & Plan Note (Signed)
Chronic Regular exercise and healthy diet encouraged Check lipid panel, CMP, TSH Continue Crestor 20 mg daily

## 2023-11-25 ENCOUNTER — Telehealth: Payer: Self-pay | Admitting: Pulmonary Disease

## 2023-11-25 DIAGNOSIS — G4733 Obstructive sleep apnea (adult) (pediatric): Secondary | ICD-10-CM

## 2023-11-25 NOTE — Telephone Encounter (Signed)
Patient needs the details for Bronch procedure 11/29/2023. Patient phone number is [phone].

## 2023-11-29 ENCOUNTER — Encounter (HOSPITAL_COMMUNITY): Payer: Self-pay | Admitting: Pulmonary Disease

## 2023-11-29 ENCOUNTER — Ambulatory Visit (HOSPITAL_COMMUNITY)
Admission: RE | Admit: 2023-11-29 | Discharge: 2023-11-29 | Disposition: A | Discharge status: Home / Self Care | Payer: Medicare Other | Attending: Pulmonary Disease | Admitting: Pulmonary Disease

## 2023-11-29 ENCOUNTER — Ambulatory Visit (HOSPITAL_COMMUNITY): Payer: Medicare Other | Admitting: Anesthesiology

## 2023-11-29 ENCOUNTER — Other Ambulatory Visit: Payer: Self-pay

## 2023-11-29 ENCOUNTER — Encounter (HOSPITAL_COMMUNITY)
Admission: RE | Disposition: A | Discharge status: Home / Self Care | Payer: Self-pay | Source: Home / Self Care | Attending: Pulmonary Disease

## 2023-11-29 DIAGNOSIS — Z79899 Other long term (current) drug therapy: Secondary | ICD-10-CM | POA: Insufficient documentation

## 2023-11-29 DIAGNOSIS — K219 Gastro-esophageal reflux disease without esophagitis: Secondary | ICD-10-CM | POA: Diagnosis not present

## 2023-11-29 DIAGNOSIS — E785 Hyperlipidemia, unspecified: Secondary | ICD-10-CM | POA: Diagnosis not present

## 2023-11-29 DIAGNOSIS — E119 Type 2 diabetes mellitus without complications: Secondary | ICD-10-CM

## 2023-11-29 DIAGNOSIS — G4733 Obstructive sleep apnea (adult) (pediatric): Secondary | ICD-10-CM

## 2023-11-29 DIAGNOSIS — Z6839 Body mass index (BMI) 39.0-39.9, adult: Secondary | ICD-10-CM | POA: Insufficient documentation

## 2023-11-29 DIAGNOSIS — I10 Essential (primary) hypertension: Secondary | ICD-10-CM

## 2023-11-29 DIAGNOSIS — E66813 Obesity, class 3: Secondary | ICD-10-CM | POA: Diagnosis not present

## 2023-11-29 HISTORY — PX: DRUG INDUCED ENDOSCOPY: SHX6808

## 2023-11-29 LAB — GLUCOSE, CAPILLARY: Glucose-Capillary: 85 mg/dL (ref 70–99)

## 2023-11-29 SURGERY — DRUG INDUCED SLEEP ENDOSCOPY
Anesthesia: General

## 2023-11-29 MED ORDER — OXYCODONE HCL 5 MG/5ML PO SOLN: 5.0000 mg | Freq: Once | ORAL | Status: DC | PRN

## 2023-11-29 MED ORDER — MEPERIDINE HCL 25 MG/ML IJ SOLN: 6.2500 mg | INTRAMUSCULAR | Status: DC | PRN

## 2023-11-29 MED ORDER — FENTANYL CITRATE (PF) 100 MCG/2ML IJ SOLN: 25.0000 ug | INTRAMUSCULAR | Status: DC | PRN

## 2023-11-29 MED ORDER — ONDANSETRON HCL 4 MG/2ML IJ SOLN: 4.0000 mg | Freq: Once | INTRAMUSCULAR | Status: DC | PRN

## 2023-11-29 MED ORDER — SODIUM CHLORIDE 0.9 % IV SOLN: INTRAVENOUS | Status: DC | PRN

## 2023-11-29 MED ORDER — OXYMETAZOLINE HCL 0.05 % NA SOLN
NASAL | Status: AC
Start: 2023-11-29 — End: ?
  Filled 2023-11-29: qty 30

## 2023-11-29 MED ORDER — ACETAMINOPHEN 325 MG PO TABS: 325.0000 mg | ORAL_TABLET | ORAL | Status: DC | PRN

## 2023-11-29 MED ORDER — PHENYLEPHRINE HCL 0.25 % NA SOLN: 1.0000 | Freq: Four times a day (QID) | NASAL | Status: DC | PRN

## 2023-11-29 MED ORDER — OXYCODONE HCL 5 MG PO TABS: 5.0000 mg | ORAL_TABLET | Freq: Once | ORAL | Status: DC | PRN

## 2023-11-29 MED ORDER — PROPOFOL 10 MG/ML IV BOLUS
INTRAVENOUS | Status: DC | PRN
  Administered 2023-11-29: 30 mg via INTRAVENOUS
  Administered 2023-11-29: 180 ug/kg/min via INTRAVENOUS

## 2023-11-29 MED ORDER — OXYMETAZOLINE HCL 0.05 % NA SOLN
2.0000 | Freq: Once | NASAL | Status: AC
  Administered 2023-11-29: 2 via NASAL

## 2023-11-29 MED ORDER — ACETAMINOPHEN 160 MG/5ML PO SOLN: 325.0000 mg | ORAL | Status: DC | PRN

## 2023-11-29 NOTE — Interval H&P Note (Signed)
History and Physical Interval Note:  11/29/2023 8:48 AM  Erica [name]  has presented today for surgery, with the diagnosis of sleep apnea.  The various methods of treatment have been discussed with the patient and family. After consideration of risks, benefits and other options for treatment, the patient has consented to  Procedure(s): DRUG INDUCED ENDOSCOPY (N/A) as a surgical intervention.  The patient's history has been reviewed, patient examined, no change in status, stable for surgery.  I have reviewed the patient's chart and labs.  Questions were answered to the patient's satisfaction.     Comer Locket Vassie Loll

## 2023-11-29 NOTE — Op Note (Signed)
Procedure: Evaluation of sleep-disordered breathing by examination of upper airway using an endoscope  CPT Codes: 82956 Evaluation of sleep-disordered breathing by examination of upper airway using an endoscope  Pre-Op Diagnose: Moderate /Severe obstructive sleep apnea with positive airway pressure intolerance (ICD-10 G47.33).  BMI : 33.99, wt 98.4 kg  Post-Op Diagnosis: Moderate /Severe obstructive sleep apnea with positive pressure airway intolerance (ICD-10 G47.33).  ANESTHESIA: IV sedation.  ESTIMATED BLOOD LOSS: None.  COMPLICATIONS: None.  BRIEF CLINICAL HISTORY: This is a 68 year old patient with a history of moderate to severe symptomatic obstructive sleep apnea, who is intolerant and unable to achieve benefit with positive pressure therapy.She  presents today for drug-induced sleep endoscopy to better characterize her locations and pattern of obstruction and to predict appropriate medical and/or surgical options moving forward.  PROCEDURE FINDINGS: There was no evidence of complete concentric palatal obstruction and she is a candidate anatomically for hypoglossal nerve stimulation therapy.  DESCRIPTION OF PROCEDURE: The patient was brought to the endoscopy room and was anesthetized via the standard drug-induced sleep endoscopy protocol. The propofol infusion rate was started at 75 mcg and gradually increased at which point, conditions that mimic sleep were gradually observed.   With the patient not responsive to verbal commands, but still with spontaneous respiration, sleep disordered breathing events and associated desaturations were clearly observed  Under these conditions, the flexible endoscope was inserted to examine both sides of the nose as well as the pharynx and larynx.  The VOTE score at baseline was partial AP , complete AP , complete AP, partial AP.  With simulated jaw advancement and tongue advancement, the hypopharyngeal obstruction and secondarily the palatal  collapse also improved.  Images in media tab  In summary, there was no evidence of complete concentric palatal obstruction and she is a candidate anatomically for hypoglossal nerve stimulation therapy.  I was present for and performed the entire procedure.  Dictated By: Comer Locket Vassie Loll MD  Post-Op Plan: EN evaluation for implantation  Diagnostic Codes: G47.33 Obstructive sleep apnea (adult)    Shalandra Leu V. Vassie Loll MD   Media Information

## 2023-11-29 NOTE — Transfer of Care (Signed)
Immediate Anesthesia Transfer of Care Note  Patient: Erica [name]  Procedure(s) Performed: DRUG INDUCED ENDOSCOPY  Patient Location: PACU  Anesthesia Type:MAC  Level of Consciousness: alert   Airway & Oxygen Therapy: Patient Spontanous Breathing  Post-op Assessment: Report given to RN  Post vital signs: Reviewed and stable  Last Vitals:  Vitals Value Taken Time  BP 127/77 11/29/23 0914  Temp 36.3 C 11/29/23 0914  Pulse 71 11/29/23 0917  Resp 13 11/29/23 0917  SpO2 98 % 11/29/23 0917  Vitals shown include unfiled device data.  Last Pain:  Vitals:   11/29/23 0914  TempSrc: Temporal  PainSc: 0-No pain         Complications: No notable events documented.

## 2023-11-29 NOTE — Discharge Instructions (Addendum)
You qualify for [facility] implantation ENT referral

## 2023-11-29 NOTE — Anesthesia Postprocedure Evaluation (Signed)
Anesthesia Post Note  Patient: Erica [name]  Procedure(s) Performed: DRUG INDUCED ENDOSCOPY     Patient location during evaluation: PACU Anesthesia Type: General Level of consciousness: awake and alert Pain management: pain level controlled Vital Signs Assessment: post-procedure vital signs reviewed and stable Respiratory status: spontaneous breathing, nonlabored ventilation, respiratory function stable and patient connected to nasal cannula oxygen Cardiovascular status: blood pressure returned to baseline and stable Postop Assessment: no apparent nausea or vomiting Anesthetic complications: no   No notable events documented.  Last Vitals:  Vitals:   11/29/23 0930 11/29/23 0940  BP: 127/87 128/89  Pulse: 65 67  Resp: (!) 9 13  Temp:    SpO2: 96% 93%    Last Pain:  Vitals:   11/29/23 0940  TempSrc:   PainSc: 0-No pain                 Crucita Lacorte

## 2023-11-29 NOTE — Telephone Encounter (Signed)
DISE completed. Refer to ENT- dr Irene Pap

## 2023-11-29 NOTE — Anesthesia Preprocedure Evaluation (Addendum)
Anesthesia Evaluation  Patient identified by MRN, date of birth, ID band Patient awake    Reviewed: Allergy & Precautions, NPO status , Patient's Chart, lab work & pertinent test results  History of Anesthesia Complications Negative for: history of anesthetic complications  Airway Mallampati: II  TM Distance: >3 FB Neck ROM: Full    Dental  (+) Teeth Intact, Dental Advisory Given, Chipped,    Pulmonary sleep apnea    breath sounds clear to auscultation       Cardiovascular hypertension, Pt. on medications and Pt. on home beta blockers (-) angina (-) Past MI and (-) CHF  Rhythm:Regular  Left ventricle: The cavity size was normal. Wall thickness was    normal. Systolic function was normal. The estimated ejection    fraction was in the range of 55% to 60%. Wall motion was normal;    there were no regional wall motion abnormalities. Features are    consistent with a pseudonormal left ventricular filling pattern,    with concomitant abnormal relaxation and increased filling    pressure (grade 2 diastolic dysfunction).   a. 09/2016: NST showed a small defect of mild severity present in the basal inferoseptal and mid inferoseptal location, consistent with ischemia. --> medically managed   Neuro/Psych  Headaches PSYCHIATRIC DISORDERS Anxiety      Neuromuscular disease    GI/Hepatic ,GERD  Medicated,,  Endo/Other  diabetes  Class 3 obesity  Renal/GU      Musculoskeletal  (+) Arthritis ,    Abdominal   Peds  Hematology negative hematology ROS (+)   Anesthesia Other Findings   Reproductive/Obstetrics                              Anesthesia Physical Anesthesia Plan  ASA: 3  Anesthesia Plan: General   Post-op Pain Management:    Induction: Intravenous  PONV Risk Score and Plan: 3 and Ondansetron and Dexamethasone  Airway Management Planned: Oral ETT  Additional Equipment:  None  Intra-op Plan:   Post-operative Plan: Extubation in OR  Informed Consent: I have reviewed the patients History and Physical, chart, labs and discussed the procedure including the risks, benefits and alternatives for the proposed anesthesia with the patient or authorized representative who has indicated his/her understanding and acceptance.     Dental advisory given  Plan Discussed with: CRNA  Anesthesia Plan Comments:         Anesthesia Quick Evaluation

## 2023-11-30 NOTE — Telephone Encounter (Signed)
Referral  was sent to Dr. Irene Pap.

## 2023-12-02 ENCOUNTER — Encounter (HOSPITAL_COMMUNITY): Payer: Self-pay | Admitting: Pulmonary Disease

## 2023-12-02 ENCOUNTER — Other Ambulatory Visit (INDEPENDENT_AMBULATORY_CARE_PROVIDER_SITE_OTHER): Payer: Self-pay

## 2023-12-05 ENCOUNTER — Ambulatory Visit (INDEPENDENT_AMBULATORY_CARE_PROVIDER_SITE_OTHER): Payer: Medicare Other | Admitting: Otolaryngology

## 2023-12-05 ENCOUNTER — Encounter (INDEPENDENT_AMBULATORY_CARE_PROVIDER_SITE_OTHER): Payer: Self-pay | Admitting: Otolaryngology

## 2023-12-05 ENCOUNTER — Telehealth: Payer: Self-pay | Admitting: *Deleted

## 2023-12-05 VITALS — BP 137/81 | HR 87 | Ht 66.0 in | Wt 217.0 lb

## 2023-12-05 DIAGNOSIS — Z91198 Patient's noncompliance with other medical treatment and regimen for other reason: Secondary | ICD-10-CM | POA: Diagnosis not present

## 2023-12-05 DIAGNOSIS — J343 Hypertrophy of nasal turbinates: Secondary | ICD-10-CM

## 2023-12-05 DIAGNOSIS — J342 Deviated nasal septum: Secondary | ICD-10-CM | POA: Diagnosis not present

## 2023-12-05 DIAGNOSIS — G4733 Obstructive sleep apnea (adult) (pediatric): Secondary | ICD-10-CM

## 2023-12-05 DIAGNOSIS — Z789 Other specified health status: Secondary | ICD-10-CM

## 2023-12-05 NOTE — Progress Notes (Unsigned)
 [name] [address] [place] [address] Phone: [phone] Subjective:   Bruce Donath, am serving as a scribe for Dr. Antoine Primas.  I'm seeing this patient by the request  of:  Pincus Sanes, MD  CC: bilateral hip pain   [id number]  09/21/2023 Chronic problem with worsening symptoms again. Discussed icing regimen and home exercises, which activities to do and which ones to avoid. Increase activity slowly otherwise. Discussed home exercises and icing regimen. Follow-up with me again in 4 to 6 weeks otherwise.   Continues to have chronic difficulty.  May have some more increase in the stability of the knee.  Will get a bone scan to further evaluate.  Had 1 in 2019 showing mild increase in uptake but we did think it was more postsurgical.  Will see if any significant worsening has happened.     Updated 12/07/2023 Erica [name] is a 68 y.o. female coming in with complaint of B hip pain. Patient states that her pain is not as bad as it was but is still present in GT of L hip. Wakes up at night with pain lateral aspect of entire leg.   Also has pain in R knee. Hx TKR.   Suppose to call about scheduling cataract surgery.   The patient did have a bone scan that did show that patient did have a synovitis or capsulitis noted of the replacement.  Regarding her back and hips had undergone an epidural at L5-S1 in June 2024.  Past Medical History:  Diagnosis Date   Abnormal stress test    a. 09/2016: NST showed a small defect of mild severity present in the basal inferoseptal and mid inferoseptal location, consistent with ischemia. --> medically managed   ALLERGIC RHINITIS    Anxiety    Bursitis    DDD (degenerative disc disease), lumbar    ESI with Ramos (spring 2016)   Depression    Diabetes mellitus without complication (HCC)    Type II   Dyslipidemia    External hemorrhoids    GERD (gastroesophageal reflux disease)     Hypertension    Obesity    Osteoarthritis    Palpitations    a. prior event monitor showing sinus tachycardia, no PAF.    Panic attacks    Hx of depression   Sleep apnea    No Cpap   Past Surgical History:  Procedure Laterality Date   ABDOMINAL HYSTERECTOMY     DILATION AND CURETTAGE OF UTERUS     DRUG INDUCED ENDOSCOPY N/A 11/29/2023   Procedure: DRUG INDUCED ENDOSCOPY;  Surgeon: Oretha Milch, MD;  Location: [facility] ENDOSCOPY;  Service: Pulmonary;  Laterality: N/A;   MASS EXCISION Left 12/30/2015   Procedure: EXCISION LEFT LEG MASS;  Surgeon: Manus Rudd, MD;  Location: [facility];  Service: General;  Laterality: Left;   NASAL TURBINATE REDUCTION Bilateral 03/04/2022   Procedure: TURBINATE REDUCTION;  Surgeon: Christia Reading, MD;  Location: [facility] OR;  Service: ENT;  Laterality: Bilateral;   REPLACEMENT TOTAL KNEE Right    RIGHT OOPHORECTOMY     No cancer   Social History   Socioeconomic History   Marital status: Married    Spouse name: Hessie Knows   Number of children: 3   Years of education: Not on file   Highest education level: Not on file  Occupational History   Occupation: Disabled  Tobacco Use   Smoking status: Never   Smokeless tobacco: Never  Tobacco comments:    tried to as a teenager  Vaping Use   Vaping status: Never Used  Substance and Sexual Activity   Alcohol use: Yes    Alcohol/week: 1.0 standard drink of alcohol    Types: 1 Glasses of wine per week    Comment: one drink a month   Drug use: No   Sexual activity: Not Currently  Other Topics Concern   Not on file  Social History Narrative   Married with children. Pt is on disablity. Previous worked in the school system   Social Drivers of Corporate investment banker Strain: Low Risk  (08/30/2023)   Overall Financial Resource Strain (CARDIA)    Difficulty of Paying Living Expenses: Not hard at all  Food Insecurity: No Food Insecurity (08/30/2023)   Hunger Vital Sign    Worried About Running  Out of Food in the Last Year: Never true    Ran Out of Food in the Last Year: Never true  Transportation Needs: No Transportation Needs (08/30/2023)   PRAPARE - Administrator, Civil Service (Medical): No    Lack of Transportation (Non-Medical): No  Physical Activity: Inactive (08/30/2023)   Exercise Vital Sign    Days of Exercise per Week: 0 days    Minutes of Exercise per Session: 0 min  Stress: Stress Concern Present (08/30/2023)   [employer] of Occupational Health - Occupational Stress Questionnaire    Feeling of Stress : To some extent  Social Connections: Moderately Integrated (08/30/2023)   Social Connection and Isolation Panel [NHANES]    Frequency of Communication with Friends and Family: More than three times a week    Frequency of Social Gatherings with Friends and Family: Twice a week    Attends Religious Services: More than 4 times per year    Active Member of [employer] or Organizations: No    Attends Banker Meetings: Never    Marital Status: Married   Allergies  Allergen Reactions   Seroquel [Quetiapine Fumarate] Other (See Comments)    Hallucinations, palpitations   Codeine Nausea And Vomiting   Guaifenesin-Codeine Other (See Comments)     feels spacey   Wellbutrin [Bupropion] Palpitations    hallucinations   Family History  Problem Relation Age of Onset   Heart disease Mother        Enlarged Heart, Pacemaker   Diabetes Mother    Thyroid disease Mother    Hyperlipidemia Mother    Hypertension Mother    Sleep apnea Mother    Dementia Father    Kidney failure Father    Prostate cancer Father    Alcoholism Father    Asthma Other    Allergies Sister    Asthma Son    Breast cancer Maternal Aunt        cousin   Colon cancer Cousin    Heart disease Son        CHF, morbid obesity   Prostate cancer Maternal Uncle    Diabetes Son     Current Outpatient Medications (Endocrine & Metabolic):    RYBELSUS 14 MG TABS, TAKE ONE  TABLET BY MOUTH EVERY DAY - (TAKE WITH NO MORE THAN 4 OUNCES OF PLAIN WATER AT LEAST 30 MINUTES BEFORE THE FIRST FOOD, BEVERAGE, OR OTHER MEDICATIONS OF THE DAY)  Current Outpatient Medications (Cardiovascular):    atenolol (TENORMIN) 25 MG tablet, TAKE ONE TABLET BY MOUTH IN THE MORNING AND TAKE TWO TABLETS IN THE EVENING   CRESTOR 20  MG tablet, TAKE ONE TABLET BY MOUTH EVERY DAY   furosemide (LASIX) 40 MG tablet, TAKE ONE TABLET BY MOUTH EVERY DAY   propranolol (INDERAL) 10 MG tablet, Take 1 tablet (10 mg total) by mouth 3 (three) times daily as needed (PALPITATIONS).  Current Outpatient Medications (Respiratory):    albuterol (VENTOLIN HFA) 108 (90 Base) MCG/ACT inhaler, Inhale 2 puffs into the lungs every 6 (six) hours as needed for wheezing or shortness of breath.   fluticasone (FLONASE) 50 MCG/ACT nasal spray, Place 2 sprays into both nostrils daily.   levocetirizine (XYZAL) 5 MG tablet, TAKE ONE TABLET BY MOUTH EVERY DAY IN THE EVENING  Current Outpatient Medications (Analgesics):    SUMAtriptan-naproxen (TREXIMET) 85-500 MG tablet, Take 1 pill as needed for migraine, if migraine persists may repeat x1 after 2 hours  Current Outpatient Medications (Hematological):    cyanocobalamin (,VITAMIN B-12,) 1000 MCG/ML injection, Inject 1,000 mcg into the muscle every 30 (thirty) days.  Current Outpatient Medications (Other):    clonazePAM (KLONOPIN) 0.5 MG tablet, Take 0.5 mg by mouth 2 (two) times daily as needed.   cyclobenzaprine (FLEXERIL) 5 MG tablet, Take 1 tablet (5 mg total) by mouth at bedtime.   gabapentin (NEURONTIN) 100 MG capsule, Take 1 capsule (100 mg total) by mouth at bedtime.   hydrOXYzine (ATARAX) 25 MG tablet, Take 25 mg by mouth 3 (three) times daily as needed for itching (allergies).   LINZESS 290 MCG CAPS capsule, TAKE ONE CAPSULE BY MOUTH EVERY DAY BEFORE BREAKFAST   Multiple Vitamins-Minerals (MULTIVITAMIN WITH MINERALS) tablet, Take 1 tablet by mouth daily.    omeprazole (PRILOSEC) 20 MG capsule, Take 1 capsule (20 mg total) by mouth 2 (two) times daily as needed (take 30 minutes prior to a meal).   ondansetron (ZOFRAN) 4 MG tablet, Take 1 tablet (4 mg total) by mouth every 8 (eight) hours as needed for nausea or vomiting.   potassium chloride SA (KLOR-CON M) 20 MEQ tablet, Take 1 tablet (20 mEq total) by mouth daily. 1 tablet by mouth daily   Tart Cherry (TART CHERRY ULTRA) 1200 MG CAPS, Take 1 capsule by mouth every evening.   TURMERIC PO, Take 1 capsule by mouth daily. Vita b-12  & b-6   valACYclovir (VALTREX) 500 MG tablet, Take 500 mg by mouth 2 (two) times daily.   venlafaxine XR (EFFEXOR-XR) 150 MG 24 hr capsule, Take 1 capsule (150 mg total) by mouth 2 (two) times daily.   Vitamin D, Ergocalciferol, (DRISDOL) 1.25 MG (50000 UNIT) CAPS capsule, TAKE ONE CAPSULE BY MOUTH EVERY SEVEN DAYS (MAY CONTAIN SOY OR PEANUT--TALK TO YOUR DOCTOR BEFORE TAKING IF YOU ARE ALLERGIC)   Reviewed prior external information including notes and imaging from  primary care provider As well as notes that were available from care everywhere and other healthcare systems.  Past medical history, social, surgical and family history all reviewed in electronic medical record.  No pertanent information unless stated regarding to the chief complaint.   Review of Systems:  No headache, visual changes, nausea, vomiting, diarrhea, constipation, dizziness, abdominal pain, skin rash, fevers, chills, night sweats, weight loss, swollen lymph nodes, body aches, joint swelling, chest pain, shortness of breath, mood changes. POSITIVE muscle aches  Objective  There were no vitals taken for this visit.   General: No apparent distress alert and oriented x3 mood and affect normal, dressed appropriately.  HEENT: Pupils equal, extraocular movements intact  Respiratory: Patient's speak in full sentences and does not appear short of breath  Cardiovascular: No lower extremity edema, non  tender, no erythema  Bilateral hip pain shows   Back exam shows   Knee exam shows     Impression and Recommendations:    The above documentation has been reviewed and is accurate and complete Judi Saa, DO

## 2023-12-05 NOTE — Telephone Encounter (Signed)
   Pre-operative Risk Assessment    Patient Name: Erica [name]  DOB: 02/03/1956 MRN: [id number]   Date of last office visit: 12/30/2022 Date of next office visit: 01/05/2024  Request for Surgical Clearance    Procedure:   Inspire Implant  Date of Surgery:  Clearance TBD                                 Surgeon:  Dr. Ashok Croon Surgeon's Group or Practice Name:  [facility] Phone number:  [phone] Fax number:  [phone]   Type of Clearance Requested:   - Medical    Type of Anesthesia:  General    Additional requests/questions:    Signed, Emmit Pomfret   12/05/2023, 12:19 PM

## 2023-12-05 NOTE — Telephone Encounter (Signed)
   Name: KENITA BINES  DOB: 1956/02/07  MRN: [id number]  Primary Cardiologist: Rollene Rotunda, MD  Chart reviewed as part of pre-operative protocol coverage. The patient has an upcoming visit scheduled with Dr. Antoine Poche on 01/05/2024 at which time clearance can be addressed in case there are any issues that would impact surgical recommendations. I added preop FYI to appointment note so that provider is aware to address at time of outpatient visit.  Per office protocol the cardiology provider should forward their finalized clearance decision and recommendations regarding antiplatelet therapy to the requesting party below.     I will route this message as FYI to requesting party and remove this message from the preop box as separate preop APP input not needed at this time.   Please call with any questions.  Napoleon Form, Leodis Rains, NP  12/05/2023, 12:35 PM

## 2023-12-05 NOTE — Progress Notes (Signed)
 ENT CONSULT:  Reason for Consult: OSA CPAP intolerance   Ref: Dr Lanny Cramp Pulm  HPI: Discussed the use of AI scribe software for clinical note transcription with the patient, who gave verbal consent to proceed.  History of Present Illness   Erica Bezek Casseus "Hart [place]" is a 68 year old female with long-standing hx of obstructive sleep apnea who presents for a consultation regarding the Inspire implant.   She has a long-standing history of sleep apnea, diagnosed approximately ten years ago. She has been using CPAP therapy since her diagnosis but has struggled with tolerance due to recurrent sinus infections and her tendency to remove the mask during sleep. Various types of masks have been tried without improvement. She has undergone about three sleep studies, with the most recent one in August 2023, with AHI of 34. Additionally, she has undergone a drug-induced sleep endoscopy with Dr. Vassie Loll, which cleared her for Inspire procedure (no CCC noted).  She experiences palpitations and panic attacks, which are managed with atenolol. She takes one atenolol in the morning and two in the evening. She has a history of an abnormal stress test in 2017, and she is under the care of a cardiologist. She is scheduled for a carotid ultrasound in March, has hx of carotid stenosis.  She has a history of diabetes, which is well-controlled with Rybelsus. She recently had a physical examination where her diabetes management was noted to be effective. She has experienced significant weight loss, which she attributes to the medication, stating that it limits her to eating once a day. BMI of 25 today. Not on blood thinners denies hx of stroke.     Records Reviewed:  Op note by Dr Vassie Loll 11/29/23 DISE - passed candidate for Inspire no [facility]  Office visit with Dr Vassie Loll 11/14/2023 Obstructive Sleep Apnea (OSA) OSA with unsuccessful CPAP and dental device trials. Discussed Inspire implant, a minor surgical procedure with a  remote control device. Explained risks, including potential interference with future pacemaker, but reassured that the implant is MRI compatible and placed on the right side. Shared outcome statistics: 30-40 successful implants with improved sleep quality. Patient expressed concerns but was reassured about the procedure and follow-up process. - Schedule DISE/endoscopy to evaluate airway closure - Stop Rybelsus one week prior to endoscopy - Arrange transportation post-procedure - Plan for potential Inspire implant surgery if airway test is favorable - Coordinate with ENT surgeon for the procedure   Weight Management Patient on Rybelsus with weight loss from 260 lbs to 218 lbs. Current BMI is within the acceptable range for the [facility] implant procedure. - Continue Rybelsus after one-week discontinuation for endoscopy - Monitor weight and BMI regularly  Cardiology visit 12/30/2022 Erica [name] is a 68 y.o. female who presents for follow up of palpitations.  She was in the emergency room on 7/31 for this.  It turns out there was a medication error.  She had run out of Xanax and was actually taking Seroquel.  Her EKG was unremarkable.  High-sensitivity troponin was normal.  There was not thought to be any cardiac etiology.   She was in the ED on 11/11 with dyspnea.  I reviewed these records for this visit.    CXR was normal.  BNP was normal.  She had palpitations.  She wore a monitor but only for about 27 hours two years ago.     Since I last saw her she has been waking up frequently with palpitations.  She has not had any  this week but she had in the past few weeks perhaps 1:59 in the morning.  She will wake up and feel her heart racing for 5 minutes.  Her psychiatrist wonders if it could be panic.  She does not remember what she had trouble wearing the monitor a few years ago.  She says that the symptoms to stop to go away spontaneously and slowly over several minutes.  She says they do not last  over an hour.  She denies any chest pressure, neck or arm discomfort.  She cannot bring these on.  She has had some mild lower extremity swelling  Abnormal stress test 2017  PALPITATIONS:  I am going to have her wear a 2-week monitor.  Electrolytes were unremarkable.  TSH was previously unremarkable during the workup a couple years ago.  Further management will be based on the results of the monitor.     HTN Her blood pressure is at target.  No change in therapy.    CAROTID STENOSIS She had  40 - 59% stenosis on both sides.  However, this was a couple years ago.  Last year was less than 1 to 39%.  I will repeat an carotid Doppler in September.    SLEEP APNEA She has been unable to use CPAP.   EDEMA I do not strongly suspect heart failure.  I will check a BNP level.    Past Medical History:  Diagnosis Date   Abnormal stress test    a. 09/2016: NST showed a small defect of mild severity present in the basal inferoseptal and mid inferoseptal location, consistent with ischemia. --> medically managed   ALLERGIC RHINITIS    Anxiety    Bursitis    DDD (degenerative disc disease), lumbar    ESI with Ramos (spring 2016)   Depression    Diabetes mellitus without complication (HCC)    Type II   Dyslipidemia    External hemorrhoids    GERD (gastroesophageal reflux disease)    Hypertension    Obesity    Osteoarthritis    Palpitations    a. prior event monitor showing sinus tachycardia, no PAF.    Panic attacks    Hx of depression   Sleep apnea    No Cpap    Past Surgical History:  Procedure Laterality Date   ABDOMINAL HYSTERECTOMY     DILATION AND CURETTAGE OF UTERUS     DRUG INDUCED ENDOSCOPY N/A 11/29/2023   Procedure: DRUG INDUCED ENDOSCOPY;  Surgeon: Oretha Milch, MD;  Location: [facility] ENDOSCOPY;  Service: Pulmonary;  Laterality: N/A;   MASS EXCISION Left 12/30/2015   Procedure: EXCISION LEFT LEG MASS;  Surgeon: Manus Rudd, MD;  Location: [facility];   Service: General;  Laterality: Left;   NASAL TURBINATE REDUCTION Bilateral 03/04/2022   Procedure: TURBINATE REDUCTION;  Surgeon: Christia Reading, MD;  Location: [facility] OR;  Service: ENT;  Laterality: Bilateral;   REPLACEMENT TOTAL KNEE Right    RIGHT OOPHORECTOMY     No cancer    Family History  Problem Relation Age of Onset   Heart disease Mother        Enlarged Heart, Pacemaker   Diabetes Mother    Thyroid disease Mother    Hyperlipidemia Mother    Hypertension Mother    Sleep apnea Mother    Dementia Father    Kidney failure Father    Prostate cancer Father    Alcoholism Father    Asthma Other    Allergies Sister  Asthma Son    Breast cancer Maternal Aunt        cousin   Colon cancer Cousin    Heart disease Son        CHF, morbid obesity   Prostate cancer Maternal Uncle    Diabetes Son     Social History:  reports that she has never smoked. She has never used smokeless tobacco. She reports current alcohol use of about 1.0 standard drink of alcohol per week. She reports that she does not use drugs.  Allergies:  Allergies  Allergen Reactions   Seroquel [Quetiapine Fumarate] Other (See Comments)    Hallucinations, palpitations   Codeine Nausea And Vomiting   Guaifenesin-Codeine Other (See Comments)     feels spacey   Wellbutrin [Bupropion] Palpitations    hallucinations    Medications: I have reviewed the patient's current medications.  The PMH, PSH, Medications, Allergies, and SH were reviewed and updated.  ROS: Constitutional: Negative for fever, weight loss and weight gain. Cardiovascular: Negative for chest pain and dyspnea on exertion. Respiratory: Is not experiencing shortness of breath at rest. Gastrointestinal: Negative for nausea and vomiting. Neurological: Negative for headaches. Psychiatric: The patient is not nervous/anxious  Blood pressure 137/81, pulse 87, height 5\' 6"  (1.676 m), weight 217 lb (98.4 kg), SpO2 97%. Body mass index is 35.02  kg/m.  PHYSICAL EXAM:  Exam: General: Well-developed, well-nourished Respiratory Respiratory effort: Equal inspiration and expiration without stridor Cardiovascular Peripheral Vascular: Warm extremities with equal color/perfusion Eyes: No nystagmus with equal extraocular motion bilaterally Neuro/Psych/Balance: Patient oriented to person, place, and time; Appropriate mood and affect; Gait is intact with no imbalance; Cranial nerves I-XII are intact Head and Face Inspection: Normocephalic and atraumatic without mass or lesion Palpation: Facial skeleton intact without bony stepoffs Salivary Glands: No mass or tenderness Facial Strength: Facial motility symmetric and full bilaterally ENT Pinna: External ear intact and fully developed External canal: Canal is patent with intact skin Tympanic Membrane: Clear and mobile External Nose: No scar or anatomic deformity Internal Nose: Septum is deviated to the left. No polyp, or purulence. Mucosal edema and erythema present.  Bilateral inferior turbinate hypertrophy.  Lips, Teeth, and gums: Mucosa and teeth intact and viable TMJ: No pain to palpation with full mobility Oral cavity/oropharynx: No erythema or exudate, no lesions present Friedman IV tongue position Nasopharynx: No mass or lesion with intact mucosa Hypopharynx: Intact mucosa without pooling of secretions Larynx Glottic: Full true vocal cord mobility without lesion or mass Supraglottic: Normal appearing epiglottis and AE folds Interarytenoid Space: Moderate pachydermia&edema Subglottic Space: Patent without lesion or edema Neck Neck and Trachea: Midline trachea without mass or lesion Thyroid: No mass or nodularity Lymphatics: No lymphadenopathy  Procedure: Preoperative diagnosis: OSA CPAP intolerance   Postoperative diagnosis:   Same   Procedure: Flexible fiberoptic laryngoscopy  Surgeon: Ashok Croon, MD  Anesthesia: Topical lidocaine and Afrin Complications:  None Condition is stable throughout exam  Indications and consent:  The patient presents to the clinic with above symptoms. Indirect laryngoscopy view was incomplete. Thus it was recommended that they undergo a flexible fiberoptic laryngoscopy. All of the risks, benefits, and potential complications were reviewed with the patient preoperatively and verbal informed consent was obtained.  Procedure: The patient was seated upright in the clinic. Topical lidocaine and Afrin were applied to the nasal cavity. After adequate anesthesia had occurred, I then proceeded to pass the flexible telescope into the nasal cavity. The nasal cavity was patent without rhinorrhea or polyp. The nasopharynx was  also patent without mass or lesion. The base of tongue was visualized and was normal. There were no signs of pooling of secretions in the piriform sinuses. The true vocal folds were mobile bilaterally. There were no signs of glottic or supraglottic mucosal lesion or mass. There was moderate interarytenoid pachydermia and post cricoid edema. The telescope was then slowly withdrawn and the patient tolerated the procedure throughout.    Studies Reviewed: Home Sleep Study 05/27/2022 AHI of 34.2 2 central/7 mixed apneas ~ 4% of total apneas hypopneas (224) BMI 40.9  Assessment/Plan: Encounter Diagnoses  Name Primary?   Intolerance of continuous positive airway pressure (CPAP) ventilation Yes   OSA (obstructive sleep apnea)    Nasal septal deviation    Hypertrophy of both inferior nasal turbinates     Assessment and Plan    Obstructive Sleep Apnea (OSA) and CPAP intolerance Long-standing OSA, diagnosed approximately ten years ago. Intolerant to CPAP therapy due to recurrent sinus infections and mask discomfort, tried different masks. Recent sleep study (August 2023) and drug-induced sleep endoscopy confirmed eligibility for [facility] implant.   OSA, moderate to severe, without multilevel collapse, with failure to  tolerate PAP therapy and/or more conservative measures. Presence of smaller/absent tonsils and larger tongue position (Friedman tongue position or modified Mallampati) suggests that hypopharyngeal/retrolingual collapse is contributing to the patient's OSA. Zachery Conch, M et al. Staging of obstructive sleep apnea/hypopnea syndrome: a guide to appropriate treatment. Laryngoscope, 2004 Mar, 114(3):454-9. PMID: [id number]) Options including positional therapy, weight loss, oral appliances, PAP and surgical correction discussed. Pt is not an ideal candidate for oral appliance due to severity of OSA Pt is a candidate for Hypoglossal nerve stimulation (Inspire therapy) based on recent DISE exam with Dr Vassie Loll (no CCC detected)  Discussed Inspire implant surgery, including device placement, wire insertion, and expected outcomes. Risks such as bleeding, infection, hypertrophic scarring, nerve praxia, and pneumothorax discussed. Device is MRI compatible. Advised avoiding strenuous activities for four weeks post-surgery. Patient expressed understanding and willingness to proceed.  - Schedule Inspire implant surgery - Obtain insurance preapproval - Needs cardiac clearance 2/2 abnormal stress test 2017 and hx of carotid stenosis  - Provide information about the [employer] program in the after-visit summary   Palpitations Palpitations managed with atenolol. Reports infrequent palpitations recently. No history of stroke or heart attack. Follow-up with cardiologist scheduled for January 05, 2024, including carotid ultrasound. Abnormal stress test noted in 2017. - Continue atenolol as prescribed - Follow up with cardiologist on January 05, 2024  Type 2 Diabetes Mellitus Well-controlled diabetes managed with Rybelsus. Recent physical examination indicates good control. Significant weight loss attributed to Rybelsus, leading to reduced appetite. - Continue Rybelsus as prescribed  General Health  Maintenance Up-to-date with general health maintenance. No additional screenings or vaccinations discussed.  Follow-up - Follow up with surgery schedulers for Inspire implant procedure - Follow up with cardiologist on January 05, 2024.      Thank you for allowing me to participate in the care of this patient. Please do not hesitate to contact me with any questions or concerns.   Ashok Croon, MD Otolaryngology [facility] Phone: [phone] Fax: [phone]    12/05/2023, 7:04 PM

## 2023-12-05 NOTE — Patient Instructions (Signed)
  It was very nice to meet you today,   Please see the following link for the [facility] program   [email or web address]  This website has information about how you can connect to other patients who have had Inspire Implant procedure

## 2023-12-07 ENCOUNTER — Ambulatory Visit (INDEPENDENT_AMBULATORY_CARE_PROVIDER_SITE_OTHER): Payer: Medicare Other | Admitting: Family Medicine

## 2023-12-07 VITALS — BP 120/88 | HR 95 | Ht 66.0 in | Wt 217.0 lb

## 2023-12-07 DIAGNOSIS — M7062 Trochanteric bursitis, left hip: Secondary | ICD-10-CM

## 2023-12-07 DIAGNOSIS — Q846 Other congenital malformations of nails: Secondary | ICD-10-CM | POA: Diagnosis not present

## 2023-12-07 DIAGNOSIS — L609 Nail disorder, unspecified: Secondary | ICD-10-CM

## 2023-12-07 NOTE — Patient Instructions (Signed)
 Will get epidural in 4 weeks if not better Urgent referral to dermatology See me again in 10 weeks

## 2023-12-08 ENCOUNTER — Encounter: Payer: Self-pay | Admitting: Family Medicine

## 2023-12-08 DIAGNOSIS — L609 Nail disorder, unspecified: Secondary | ICD-10-CM | POA: Insufficient documentation

## 2023-12-08 NOTE — Assessment & Plan Note (Signed)
 Middle finger shows the patient does have any erosive changes noted.  Concerning for either severe fungal infection or even a abnormal mole.  Referred urgently to dermatology for further evaluation.

## 2023-12-08 NOTE — Assessment & Plan Note (Signed)
 Repeat injection given today, tolerated the procedure well, hopeful that this will make some improvement again.  Has done relatively well previously on injections.  Follow-up again in 6 to 8 weeks otherwise

## 2023-12-15 ENCOUNTER — Other Ambulatory Visit: Payer: Self-pay | Admitting: Internal Medicine

## 2024-01-02 NOTE — Progress Notes (Deleted)
  Cardiology Office Note:   Date:  01/02/2024  ID:  Erica [name], DOB 1956-09-15, MRN [id number] PCP: Pincus Sanes, MD  [facility] Providers Cardiologist:  Rollene Rotunda, MD {  History of Present Illness:   Erica [name] is a 68 y.o. female who presents for follow up of palpitations.  She was in the emergency room on 7/31 for this.  It turns out there was a medication error.  She had run out of Xanax and was actually taking Seroquel.  Her EKG was unremarkable.  High-sensitivity troponin was normal.  There was not thought to be any cardiac etiology.   She was in the ED on 11/11 with dyspnea.  I reviewed these records for this visit.    CXR was normal.  BNP was normal.  She had palpitations.  She wore a monitor but only for about 27 hours two years ago.     Since I last saw her she has been waking up frequently with palpitations.  She has not had any this week but she had in the past few weeks perhaps 1:59 in the morning.  She will wake up and feel her heart racing for 5 minutes.  Her psychiatrist wonders if it could be panic.  She does not remember what she had trouble wearing the monitor a few years ago.  She says that the symptoms to stop to go away spontaneously and slowly over several minutes.  She says they do not last over an hour.  She denies any chest pressure, neck or arm discomfort.  She cannot bring these on.  She has had some mild lower extremity swelling.  ROS: ***  Studies Reviewed:    EKG:       ***  Risk Assessment/Calculations:   {Does this patient have ATRIAL FIBRILLATION?:[phone]} No BP recorded.  {Refresh Note OR Click here to enter BP  :1}***        Physical Exam:   VS:  There were no vitals taken for this visit.   Wt Readings from Last 3 Encounters:  12/07/23 217 lb (98.4 kg)  12/05/23 217 lb (98.4 kg)  11/29/23 217 lb (98.4 kg)     GEN: Well nourished, well developed in no acute distress NECK: No JVD; No carotid bruits CARDIAC: ***RR,  *** murmurs, rubs, gallops RESPIRATORY:  Clear to auscultation without rales, wheezing or rhonchi  ABDOMEN: Soft, non-tender, non-distended EXTREMITIES:  No edema; No deformity   ASSESSMENT AND PLAN:   PALPITATIONS:  ***  I am going to have her wear a 2-week monitor.  Electrolytes were unremarkable.  TSH was previously unremarkable during the workup a couple years ago.  Further management will be based on the results of the monitor.     HTN Her blood pressure is ***  at target.  No change in therapy.    CAROTID STENOSIS He had no significant plaque on Doppler in October 2024.  *** She had  40 - 59% stenosis on both sides.  However, this was a couple years ago.  Last year was less than 1 to 39%.  I will repeat an carotid Doppler in September.    SLEEP APNEA ***  She has been unable to use CPAP.   EDEMA ***I do not strongly suspect heart failure.  I will check a BNP level.    Follow up ***  Signed, Rollene Rotunda, MD

## 2024-01-05 ENCOUNTER — Ambulatory Visit: Payer: Medicare Other | Attending: Cardiology | Admitting: Cardiology

## 2024-01-05 DIAGNOSIS — I6529 Occlusion and stenosis of unspecified carotid artery: Secondary | ICD-10-CM

## 2024-01-05 DIAGNOSIS — M7989 Other specified soft tissue disorders: Secondary | ICD-10-CM

## 2024-01-05 DIAGNOSIS — I10 Essential (primary) hypertension: Secondary | ICD-10-CM

## 2024-01-05 DIAGNOSIS — R002 Palpitations: Secondary | ICD-10-CM

## 2024-01-06 ENCOUNTER — Encounter: Payer: Self-pay | Admitting: Cardiology

## 2024-01-19 DIAGNOSIS — H25813 Combined forms of age-related cataract, bilateral: Secondary | ICD-10-CM | POA: Diagnosis not present

## 2024-01-20 ENCOUNTER — Institutional Professional Consult (permissible substitution) (INDEPENDENT_AMBULATORY_CARE_PROVIDER_SITE_OTHER): Payer: Medicare Other | Admitting: Otolaryngology

## 2024-02-14 NOTE — Progress Notes (Addendum)
 [facility] [address] [place] [address] Phone: [phone] Subjective:   Erica [name], am serving as a scribe for Dr. Ronnell Coins.  I'm seeing this patient by the request  of:  Colene Dauphin, MD  CC: Left hip and knee pain follow-up  BJY:[id number]  12/07/2023 Middle finger shows the patient does have any erosive changes noted.  Concerning for either severe fungal infection or even a abnormal mole.  Referred urgently to dermatology for further evaluation.     Repeat injection given today, tolerated the procedure well, hopeful that this will make some improvement again.  Has done relatively well previously on injections.  Follow-up again in 6 to 8 weeks otherwise     Updated 02/15/2024 Erica [name] is a 68 y.o. female coming in with complaint of L hip pain and knee pain. L shin pain that can wake her at night. R knee and L hip.       Past Medical History:  Diagnosis Date   Abnormal stress test    a. 09/2016: NST showed a small defect of mild severity present in the basal inferoseptal and mid inferoseptal location, consistent with ischemia. --> medically managed   ALLERGIC RHINITIS    Anxiety    Bursitis    DDD (degenerative disc disease), lumbar    ESI with Ramos (spring 2016)   Depression    Diabetes mellitus without complication (HCC)    Type II   Dyslipidemia    External hemorrhoids    GERD (gastroesophageal reflux disease)    Hypertension    Obesity    Osteoarthritis    Palpitations    a. prior event monitor showing sinus tachycardia, no PAF.    Panic attacks    Hx of depression   Sleep apnea    No Cpap   Past Surgical History:  Procedure Laterality Date   ABDOMINAL HYSTERECTOMY     DILATION AND CURETTAGE OF UTERUS     DRUG INDUCED ENDOSCOPY N/A 11/29/2023   Procedure: DRUG INDUCED ENDOSCOPY;  Surgeon: Lind Repine, MD;  Location: [facility] ENDOSCOPY;  Service: Pulmonary;  Laterality: N/A;   MASS EXCISION Left  12/30/2015   Procedure: EXCISION LEFT LEG MASS;  Surgeon: Dareen Ebbing, MD;  Location: [facility];  Service: General;  Laterality: Left;   NASAL TURBINATE REDUCTION Bilateral 03/04/2022   Procedure: TURBINATE REDUCTION;  Surgeon: Virgina Grills, MD;  Location: [facility] OR;  Service: ENT;  Laterality: Bilateral;   REPLACEMENT TOTAL KNEE Right    RIGHT OOPHORECTOMY     No cancer   Social History   Socioeconomic History   Marital status: Married    Spouse name: Graciela Lava   Number of children: 3   Years of education: Not on file   Highest education level: Not on file  Occupational History   Occupation: Disabled  Tobacco Use   Smoking status: Never   Smokeless tobacco: Never   Tobacco comments:    tried to as a teenager  Vaping Use   Vaping status: Never Used  Substance and Sexual Activity   Alcohol use: Yes    Alcohol/week: 1.0 standard drink of alcohol    Types: 1 Glasses of wine per week    Comment: one drink a month   Drug use: No   Sexual activity: Not Currently  Other Topics Concern   Not on file  Social History Narrative   Married with children. Pt is on disablity. Previous worked in the  school system   Social Drivers of Health   Financial Resource Strain: Low Risk  (08/30/2023)   Overall Financial Resource Strain (CARDIA)    Difficulty of Paying Living Expenses: Not hard at all  Food Insecurity: No Food Insecurity (08/30/2023)   Hunger Vital Sign    Worried About Running Out of Food in the Last Year: Never true    Ran Out of Food in the Last Year: Never true  Transportation Needs: No Transportation Needs (08/30/2023)   PRAPARE - Administrator, Civil Service (Medical): No    Lack of Transportation (Non-Medical): No  Physical Activity: Inactive (08/30/2023)   Exercise Vital Sign    Days of Exercise per Week: 0 days    Minutes of Exercise per Session: 0 min  Stress: Stress Concern Present (08/30/2023)   [employer] of Occupational Health -  Occupational Stress Questionnaire    Feeling of Stress : To some extent  Social Connections: Moderately Integrated (08/30/2023)   Social Connection and Isolation Panel [NHANES]    Frequency of Communication with Friends and Family: More than three times a week    Frequency of Social Gatherings with Friends and Family: Twice a week    Attends Religious Services: More than 4 times per year    Active Member of [employer] or Organizations: No    Attends Banker Meetings: Never    Marital Status: Married   Allergies  Allergen Reactions   Seroquel [Quetiapine Fumarate] Other (See Comments)    Hallucinations, palpitations   Codeine Nausea And Vomiting   Guaifenesin -Codeine Other (See Comments)     feels spacey   Wellbutrin [Bupropion] Palpitations    hallucinations   Family History  Problem Relation Age of Onset   Heart disease Mother        Enlarged Heart, Pacemaker   Diabetes Mother    Thyroid  disease Mother    Hyperlipidemia Mother    Hypertension Mother    Sleep apnea Mother    Dementia Father    Kidney failure Father    Prostate cancer Father    Alcoholism Father    Asthma Other    Allergies Sister    Asthma Son    Breast cancer Maternal Aunt        cousin   Colon cancer Cousin    Heart disease Son        CHF, morbid obesity   Prostate cancer Maternal Uncle    Diabetes Son     Current Outpatient Medications (Endocrine & Metabolic):    RYBELSUS  14 MG TABS, TAKE ONE TABLET BY MOUTH EVERY DAY - (TAKE WITH NO MORE THAN 4 OUNCES OF PLAIN WATER AT LEAST 30 MINUTES BEFORE THE FIRST FOOD, BEVERAGE, OR OTHER MEDICATIONS OF THE DAY)  Current Outpatient Medications (Cardiovascular):    atenolol  (TENORMIN ) 25 MG tablet, TAKE ONE TABLET BY MOUTH IN THE MORNING AND TAKE TWO TABLETS IN THE  EVENING   CRESTOR  20 MG tablet, TAKE ONE TABLET BY MOUTH EVERY DAY   furosemide  (LASIX ) 40 MG tablet, TAKE ONE TABLET BY MOUTH EVERY DAY   propranolol  (INDERAL ) 10 MG tablet, Take 1  tablet (10 mg total) by mouth 3 (three) times daily as needed (PALPITATIONS).  Current Outpatient Medications (Respiratory):    albuterol  (VENTOLIN  HFA) 108 (90 Base) MCG/ACT inhaler, Inhale 2 puffs into the lungs every 6 (six) hours as needed for wheezing or shortness of breath.   fluticasone  (FLONASE ) 50 MCG/ACT nasal spray, INSTILL 2 SPRAYS IN [facility]  NOSTRIL EVERY DAY   levocetirizine (XYZAL ) 5 MG tablet, TAKE ONE TABLET BY MOUTH EVERY DAY IN THE EVENING  Current Outpatient Medications (Analgesics):    SUMAtriptan -naproxen  (TREXIMET ) 85-500 MG tablet, Take 1 pill as needed for migraine, if migraine persists may repeat x1 after 2 hours  Current Outpatient Medications (Hematological):    cyanocobalamin  (,VITAMIN B-12,) 1000 MCG/ML injection, Inject 1,000 mcg into the muscle every 30 (thirty) days.  Current Outpatient Medications (Other):    clonazePAM (KLONOPIN) 0.5 MG tablet, Take 0.5 mg by mouth 2 (two) times daily as needed.   cyclobenzaprine  (FLEXERIL ) 5 MG tablet, Take 1 tablet (5 mg total) by mouth at bedtime.   gabapentin  (NEURONTIN ) 100 MG capsule, Take 1 capsule (100 mg total) by mouth at bedtime.   hydrOXYzine  (ATARAX ) 25 MG tablet, Take 25 mg by mouth 3 (three) times daily as needed for itching (allergies).   LINZESS  290 MCG CAPS capsule, TAKE ONE CAPSULE BY MOUTH EVERY DAY BEFORE BREAKFAST   Multiple Vitamins-Minerals (MULTIVITAMIN WITH MINERALS) tablet, Take 1 tablet by mouth daily.   omeprazole  (PRILOSEC) 20 MG capsule, Take 1 capsule (20 mg total) by mouth 2 (two) times daily as needed (take 30 minutes prior to a meal).   ondansetron  (ZOFRAN ) 4 MG tablet, Take 1 tablet (4 mg total) by mouth every 8 (eight) hours as needed for nausea or vomiting.   potassium chloride  SA (KLOR-CON  M) 20 MEQ tablet, TAKE ONE TABLET BY MOUTH EVERY DAY   Tart Cherry (TART CHERRY ULTRA) 1200 MG CAPS, Take 1 capsule by mouth every evening.   TURMERIC PO, Take 1 capsule by mouth daily. Vita b-12  &  b-6   valACYclovir (VALTREX) 500 MG tablet, Take 500 mg by mouth 2 (two) times daily.   venlafaxine  XR (EFFEXOR -XR) 150 MG 24 hr capsule, Take 1 capsule (150 mg total) by mouth 2 (two) times daily.   Vitamin D , Ergocalciferol , (DRISDOL ) 1.25 MG (50000 UNIT) CAPS capsule, TAKE ONE CAPSULE BY MOUTH EVERY SEVEN DAYS (MAY CONTAIN SOY OR PEANUT--TALK TO YOUR DOCTOR BEFORE TAKING IF YOU ARE ALLERGIC)   Reviewed prior external information including notes and imaging from  primary care provider As well as notes that were available from care everywhere and other healthcare systems.  Past medical history, social, surgical and family history all reviewed in electronic medical record.  No pertanent information unless stated regarding to the chief complaint.   Review of Systems:  No headache, visual changes, nausea, vomiting, diarrhea, constipation, dizziness, abdominal pain, skin rash, fevers, chills, night sweats, weight loss, swollen lymph nodes, body aches, joint swelling, chest pain, shortness of breath, mood changes. POSITIVE muscle aches  Objective  Pulse 74, height 5\' 6"  (1.676 m), weight 208 lb (94.3 kg), SpO2 97%.   General: No apparent distress alert and oriented x3 mood and affect normal, dressed appropriately.  HEENT: Pupils equal, extraocular movements intact  Respiratory: Patient's speak in full sentences and does not appear short of breath  Cardiovascular: No lower extremity edema, non tender, no erythema  Antalgic gait noted.  Severe tenderness to palpation over the left greater trochanteric area.   After verbal consent patient was prepped with alcohol swab and with a 21-gauge 2 inch needle injected into the left greater trochanteric area with 2 cc of 0.5% Marcaine  and 1 cc of Kenalog  40 mg/mL.  No blood loss.  Band-Aid placed.  Postinjection instructions given    Impression and Recommendations:     The above documentation has been reviewed and is  accurate and complete Lonnel Gjerde M  Lala Been, DO

## 2024-02-15 ENCOUNTER — Ambulatory Visit: Payer: Medicare Other | Admitting: Family Medicine

## 2024-02-15 ENCOUNTER — Ambulatory Visit (INDEPENDENT_AMBULATORY_CARE_PROVIDER_SITE_OTHER)

## 2024-02-15 VITALS — HR 74 | Ht 66.0 in | Wt 208.0 lb

## 2024-02-15 DIAGNOSIS — M79605 Pain in left leg: Secondary | ICD-10-CM

## 2024-02-15 DIAGNOSIS — M79662 Pain in left lower leg: Secondary | ICD-10-CM | POA: Diagnosis not present

## 2024-02-15 DIAGNOSIS — Q846 Other congenital malformations of nails: Secondary | ICD-10-CM | POA: Diagnosis not present

## 2024-02-15 DIAGNOSIS — M19072 Primary osteoarthritis, left ankle and foot: Secondary | ICD-10-CM | POA: Diagnosis not present

## 2024-02-15 DIAGNOSIS — E538 Deficiency of other specified B group vitamins: Secondary | ICD-10-CM | POA: Diagnosis not present

## 2024-02-15 DIAGNOSIS — M1712 Unilateral primary osteoarthritis, left knee: Secondary | ICD-10-CM | POA: Diagnosis not present

## 2024-02-15 DIAGNOSIS — M7062 Trochanteric bursitis, left hip: Secondary | ICD-10-CM | POA: Diagnosis not present

## 2024-02-15 DIAGNOSIS — M25552 Pain in left hip: Secondary | ICD-10-CM | POA: Diagnosis not present

## 2024-02-15 MED ORDER — CYANOCOBALAMIN 1000 MCG/ML IJ SOLN
1000.0000 ug | Freq: Once | INTRAMUSCULAR | Status: AC
  Administered 2024-02-15: 1000 ug via INTRAMUSCULAR

## 2024-02-15 MED ORDER — CYCLOBENZAPRINE HCL 5 MG PO TABS: 5.0000 mg | ORAL_TABLET | Freq: Every day | ORAL | 1 refills | Status: DC

## 2024-02-15 NOTE — Patient Instructions (Addendum)
 Injection in L hip today B12 injection today Be proud of what you've done See you again in 3 months

## 2024-02-16 ENCOUNTER — Encounter: Payer: Self-pay | Admitting: Family Medicine

## 2024-02-16 NOTE — Assessment & Plan Note (Signed)
 B12 injection given today.

## 2024-02-16 NOTE — Assessment & Plan Note (Signed)
 Chronic problem with worsening symptoms.  Doing very well with the weight loss.  Encouraged her to continue to stay active.  Do feel that the amount of walking could be potentially contributing and encouraged more of the hip abductor and crosstraining somewhat.  Patient will increase activity slowly.  Discussed icing regimen and home exercises.  Increase activity slowly.  Follow-up with me again in 6 to 8 weeks.

## 2024-02-28 DIAGNOSIS — H25812 Combined forms of age-related cataract, left eye: Secondary | ICD-10-CM | POA: Diagnosis not present

## 2024-03-07 DIAGNOSIS — M7989 Other specified soft tissue disorders: Secondary | ICD-10-CM | POA: Insufficient documentation

## 2024-03-07 NOTE — Progress Notes (Unsigned)
  Cardiology Office Note:   Date:  03/08/2024  ID:  Erica [name], DOB 1955-12-27, MRN [id number] PCP: Colene Dauphin, MD  [facility] Providers Cardiologist:  Eilleen Grates, MD {  History of Present Illness:   Erica [name] is a 68 y.o. female who presents for follow up of palpitations.  She wore a monitor in 2024 and had a monitor with no significant arrhythmias.  She is still bothered by palpitations.  She says they wake her at night.  I tried to give her an extra little dose of propranolol  to take as needed but she is not able to use this.  She feels fatigued all the time.  She blames that on the PVCs.  She is not having any presyncope or syncope.  She is not having any chest pressure, neck or arm discomfort.  She does have sleep apnea but could not use CPAP.  She is actually now going to get an implanted hypoglossal muscle nerve stimulator.  ROS: As stated in the HPI and negative for all other systems.  Studies Reviewed:    EKG:   EKG Interpretation Date/Time:  Thursday Mar 08 2024 10:55:35 EDT Ventricular Rate:  77 PR Interval:  162 QRS Duration:  86 QT Interval:  372 QTC Calculation: 420 R Axis:   -9  Text Interpretation: Normal sinus rhythm Poor anterior R wave progression When compared with ECG of 14-Sep-2020 14:21, No significant change was found Confirmed by Eilleen Grates ([id number]) on 03/08/2024 10:58:12 AM  Risk Assessment/Calculations:              Physical Exam:   VS:  BP 120/84   Pulse 77   Ht 5\' 6"  (1.676 m)   Wt 203 lb (92.1 kg)   SpO2 95%   BMI 32.77 kg/m    Wt Readings from Last 3 Encounters:  03/08/24 203 lb (92.1 kg)  02/15/24 208 lb (94.3 kg)  12/07/23 217 lb (98.4 kg)     GEN: Well nourished, well developed in no acute distress NECK: No JVD; No carotid bruits CARDIAC: RRR, no murmurs, rubs, gallops RESPIRATORY:  Clear to auscultation without rales, wheezing or rhonchi  ABDOMEN: Soft, non-tender, non-distended EXTREMITIES:  No  edema; No deformity   ASSESSMENT AND PLAN:   PALPITATIONS:  She is symptomatically bothered by what I suspect are some PVCs and PACs.  We have not been able to capture these on the monitor.  I am going to reduce her atenolol  to 25 mg twice daily and add a p.m. 30 mg Cardizem just to see if symptomatically that helps.  She is up-to-date with blood work so this does not need to be repeated.    HTN Her blood pressure is at target.  No change in therapy.  CAROTID STENOSIS She had  mild plaque in 07/2023.  No further imaging is indicated.   SLEEP APNEA I agree with Inspire placement.  We talked about this and she has a scheduled.   EDEMA BNP was normal in 2024.  She had a normal systolic function in 2018.  She did have some evidence of some diastolic dysfunction.  She is not particular bothered by this.  No change in therapy.  She is not having any new shortness of breath.       Follow up with APP in six months.   Signed, Eilleen Grates, MD

## 2024-03-08 ENCOUNTER — Ambulatory Visit: Attending: Cardiology | Admitting: Cardiology

## 2024-03-08 ENCOUNTER — Encounter: Payer: Self-pay | Admitting: Cardiology

## 2024-03-08 VITALS — BP 120/84 | HR 77 | Ht 66.0 in | Wt 203.0 lb

## 2024-03-08 DIAGNOSIS — I10 Essential (primary) hypertension: Secondary | ICD-10-CM

## 2024-03-08 DIAGNOSIS — M7989 Other specified soft tissue disorders: Secondary | ICD-10-CM | POA: Diagnosis not present

## 2024-03-08 DIAGNOSIS — R002 Palpitations: Secondary | ICD-10-CM | POA: Diagnosis not present

## 2024-03-08 MED ORDER — DILTIAZEM HCL 30 MG PO TABS: 30.0000 mg | ORAL_TABLET | Freq: Every day | ORAL | 2 refills | Status: AC

## 2024-03-08 MED ORDER — ATENOLOL 25 MG PO TABS: 25.0000 mg | ORAL_TABLET | Freq: Two times a day (BID) | ORAL | 3 refills | Status: AC

## 2024-03-08 MED ORDER — DILTIAZEM HCL 30 MG PO TABS: 30.0000 mg | ORAL_TABLET | Freq: Every day | ORAL | 2 refills | Status: DC

## 2024-03-08 MED ORDER — ATENOLOL 25 MG PO TABS: 25.0000 mg | ORAL_TABLET | Freq: Two times a day (BID) | ORAL | 2 refills | Status: DC

## 2024-03-08 NOTE — Patient Instructions (Signed)
 Medication Instructions:   Decrease take 25 mg Atenolol  twice a day  Cardizem 30 mg at bedtime daily   *If you need a refill on your cardiac medications before your next appointment, please call your pharmacy*   Lab Work: Not needed   Testing/Procedures: Not needed   Follow-Up: At [facility], you and your health needs are our priority.  As part of our continuing mission to provide you with exceptional heart care, we have created designated Provider Care Teams.  These Care Teams include your primary Cardiologist (physician) and Advanced Practice Providers (APPs -  Physician Assistants and Nurse Practitioners) who all work together to provide you with the care you need, when you need it.     Your next appointment:   6 month(s)  The format for your next appointment:   In Person  Provider:   One of our Advanced Practice Providers (APPs): Melita Springer, PA-C  Friddie Jetty, NP Evaline Hill, NP  Theotis Flake, PA-C Lawana Pray, NP  Willis Harter, PA-C Lovette Rud, PA-C  [place], PA-C Ernest Dick, NP  Marlana Silvan, NP Marcie Sever, PA-C  Laquita Plant, PA-C    Dayna Dunn, PA-C  Marlyse Single, PA-C Palmer Bobo, NP Katlyn West, NP Callie Goodrich, PA-C  Evan Williams, PA-C Sheng Haley, PA-C  Xika Zhao, NP Kathleen Johnson, PA-C

## 2024-03-08 NOTE — Addendum Note (Signed)
 Addended by: Bebe Bourdon on: 03/08/2024 01:42 PM   Modules accepted: Orders

## 2024-03-28 DIAGNOSIS — L603 Nail dystrophy: Secondary | ICD-10-CM | POA: Diagnosis not present

## 2024-03-28 DIAGNOSIS — B351 Tinea unguium: Secondary | ICD-10-CM | POA: Diagnosis not present

## 2024-04-19 ENCOUNTER — Telehealth: Payer: Self-pay | Admitting: Pulmonary Disease

## 2024-04-19 NOTE — Telephone Encounter (Signed)
 Received email that ENT was having trouble reaching patient to discuss Inspire implantation. Attempted to call patient, left voicemail. If patient returns call, please have patient reach out to [facility] ENT, Dr. Vallery office.

## 2024-04-20 NOTE — Telephone Encounter (Signed)
 found the number and got patient transferred over to ent specialist office

## 2024-04-20 NOTE — Telephone Encounter (Signed)
 Patient returning call she received . Relayed information to patient to reach out to ENT specialist

## 2024-04-27 ENCOUNTER — Telehealth: Payer: Self-pay | Admitting: Family Medicine

## 2024-04-27 NOTE — Telephone Encounter (Signed)
 Needs refills, please note pharmacy location requested by patient  Flexeril --please sent to [facility]  Vit D--Please sent to [facility]

## 2024-05-01 ENCOUNTER — Other Ambulatory Visit: Payer: Self-pay

## 2024-05-01 MED ORDER — CYCLOBENZAPRINE HCL 5 MG PO TABS: 5.0000 mg | ORAL_TABLET | Freq: Every day | ORAL | 1 refills | Status: AC

## 2024-05-01 MED ORDER — VITAMIN D (ERGOCALCIFEROL) 1.25 MG (50000 UNIT) PO CAPS: 50000.0000 [IU] | ORAL_CAPSULE | ORAL | 0 refills | Status: DC

## 2024-05-10 ENCOUNTER — Other Ambulatory Visit: Payer: Self-pay | Admitting: Internal Medicine

## 2024-05-10 DIAGNOSIS — E785 Hyperlipidemia, unspecified: Secondary | ICD-10-CM

## 2024-05-13 ENCOUNTER — Other Ambulatory Visit: Payer: Self-pay | Admitting: Podiatry

## 2024-05-13 NOTE — Progress Notes (Unsigned)
 Subjective:    Patient ID: Erica [name], female    DOB: December 15, 1955, 68 y.o.   MRN: [id number]     HPI Erica [name] is here for follow up of her chronic medical problems.  Getting inspire in October.    Medications and allergies reviewed with patient and updated if appropriate.  Current Outpatient Medications on File Prior to Visit  Medication Sig Dispense Refill   albuterol  (VENTOLIN  HFA) 108 (90 Base) MCG/ACT inhaler Inhale 2 puffs into the lungs every 6 (six) hours as needed for wheezing or shortness of breath. 16 g 3   atenolol  (TENORMIN ) 25 MG tablet Take 1 tablet (25 mg total) by mouth 2 (two) times daily. 180 tablet 3   clonazePAM (KLONOPIN) 0.5 MG tablet Take 0.5 mg by mouth 2 (two) times daily as needed.     CRESTOR  20 MG tablet TAKE ONE TABLET BY MOUTH EVERY DAY 90 tablet 2   cyanocobalamin  (,VITAMIN B-12,) 1000 MCG/ML injection Inject 1,000 mcg into the muscle every 30 (thirty) days.     cyclobenzaprine  (FLEXERIL ) 5 MG tablet Take 1 tablet (5 mg total) by mouth at bedtime. 30 tablet 1   diltiazem  (CARDIZEM ) 30 MG tablet Take 1 tablet (30 mg total) by mouth at bedtime. 90 tablet 2   fluticasone  (FLONASE ) 50 MCG/ACT nasal spray INSTILL 2 SPRAYS IN EACH NOSTRIL EVERY DAY 9.9 mL 2   furosemide  (LASIX ) 40 MG tablet TAKE ONE TABLET BY MOUTH EVERY DAY 90 tablet 2   gabapentin  (NEURONTIN ) 100 MG capsule Take 1 capsule (100 mg total) by mouth at bedtime. 90 capsule 0   hydrOXYzine  (ATARAX ) 25 MG tablet Take 25 mg by mouth 3 (three) times daily as needed for itching (allergies).     levocetirizine (XYZAL ) 5 MG tablet TAKE ONE TABLET BY MOUTH EVERY DAY IN THE EVENING 90 tablet 2   LINZESS  290 MCG CAPS capsule TAKE ONE CAPSULE BY MOUTH EVERY DAY BEFORE BREAKFAST 90 capsule 2   Multiple Vitamins-Minerals (MULTIVITAMIN WITH MINERALS) tablet Take 1 tablet by mouth daily.     omeprazole  (PRILOSEC) 20 MG capsule Take 1 capsule (20 mg total) by mouth 2 (two) times daily as needed  (take 30 minutes prior to a meal). 180 capsule 2   ondansetron  (ZOFRAN ) 4 MG tablet Take 1 tablet (4 mg total) by mouth every 8 (eight) hours as needed for nausea or vomiting. 60 tablet 1   potassium chloride  SA (KLOR-CON  M) 20 MEQ tablet TAKE ONE TABLET BY MOUTH EVERY DAY 90 tablet 3   RYBELSUS  14 MG TABS TAKE ONE TABLET BY MOUTH EVERY DAY - (TAKE WITH NO MORE THAN 4 OUNCES OF PLAIN WATER AT LEAST 30 MINUTES BEFORE THE FIRST FOOD, BEVERAGE, OR OTHER MEDICATIONS OF THE DAY) 90 tablet 2   SUMAtriptan -naproxen  (TREXIMET ) 85-500 MG tablet Take 1 pill as needed for migraine, if migraine persists may repeat x1 after 2 hours 10 tablet 8   Tart Cherry (TART CHERRY ULTRA) 1200 MG CAPS Take 1 capsule by mouth every evening.     TURMERIC PO Take 1 capsule by mouth daily. Vita b-12  & b-6     valACYclovir  (VALTREX ) 500 MG tablet Take 500 mg by mouth 2 (two) times daily.     venlafaxine  XR (EFFEXOR -XR) 150 MG 24 hr capsule Take 1 capsule (150 mg total) by mouth 2 (two) times daily. 180 capsule 2   Vitamin D , Ergocalciferol , (DRISDOL ) 1.25 MG (50000 UNIT) CAPS capsule Take 1 capsule (50,000 Units  total) by mouth every 7 (seven) days. 12 capsule 0   propranolol  (INDERAL ) 10 MG tablet Take 1 tablet (10 mg total) by mouth 3 (three) times daily as needed (PALPITATIONS). (Patient not taking: Reported on 05/14/2024) 60 tablet 3   [DISCONTINUED] oxycodone  (OXY-IR) 5 MG capsule Take 5 mg by mouth every 4 (four) hours as needed. For pain.     No current facility-administered medications on file prior to visit.     Review of Systems  Constitutional:  Negative for fever.  HENT:  Positive for postnasal drip and sore throat (in morning). Negative for sinus pressure.   Respiratory:  Negative for cough, shortness of breath and wheezing.   Cardiovascular:  Positive for leg swelling (occ if she sits a lot). Negative for chest pain and palpitations.  Gastrointestinal:  Positive for constipation.  Neurological:  Positive for  light-headedness (occ). Negative for headaches.       Objective:   Vitals:   05/14/24 1103  BP: 118/76  Pulse: 75  Temp: 97.6 F (36.4 C)  SpO2: 97%   BP Readings from Last 3 Encounters:  05/14/24 118/76  03/08/24 120/84  12/07/23 120/88   Wt Readings from Last 3 Encounters:  05/14/24 209 lb (94.8 kg)  03/08/24 203 lb (92.1 kg)  02/15/24 208 lb (94.3 kg)   Body mass index is 33.73 kg/m.    Physical Exam Constitutional:      General: She is not in acute distress.    Appearance: Normal appearance.  HENT:     Head: Normocephalic and atraumatic.     Mouth/Throat:     Mouth: Mucous membranes are moist.     Pharynx: No posterior oropharyngeal erythema.     Comments: Cold sore on right lower lip Eyes:     Conjunctiva/sclera: Conjunctivae normal.  Cardiovascular:     Rate and Rhythm: Normal rate and regular rhythm.     Heart sounds: Normal heart sounds.  Pulmonary:     Effort: Pulmonary effort is normal. No respiratory distress.     Breath sounds: Normal breath sounds. No wheezing.  Musculoskeletal:     Cervical back: Neck supple.     Right lower leg: No edema.     Left lower leg: No edema.  Lymphadenopathy:     Cervical: No cervical adenopathy.  Skin:    General: Skin is warm and dry.     Findings: No rash.  Neurological:     Mental Status: She is alert. Mental status is at baseline.  Psychiatric:        Mood and Affect: Mood normal.        Behavior: Behavior normal.        Lab Results  Component Value Date   WBC 6.5 11/15/2023   HGB 12.8 11/15/2023   HCT 39.4 11/15/2023   PLT 293.0 11/15/2023   GLUCOSE 99 11/15/2023   CHOL 154 11/15/2023   TRIG 75.0 11/15/2023   HDL 58.60 11/15/2023   LDLCALC 80 11/15/2023   ALT 9 11/15/2023   AST 15 11/15/2023   NA 139 11/15/2023   K 3.7 11/15/2023   CL 102 11/15/2023   CREATININE 0.78 11/15/2023   BUN 15 11/15/2023   CO2 30 11/15/2023   TSH 2.79 11/15/2023   HGBA1C 6.1 11/15/2023   MICROALBUR 0.5  06/02/2020     Assessment & Plan:    See Problem List for Assessment and Plan of chronic medical problems.

## 2024-05-13 NOTE — Patient Instructions (Addendum)
    B12 injection today.   Blood work was ordered.       Medications changes include :   valtrex  for cold sores as needed     Return in about 6 months (around 11/15/2024) for Physical Exam.

## 2024-05-14 ENCOUNTER — Ambulatory Visit (INDEPENDENT_AMBULATORY_CARE_PROVIDER_SITE_OTHER): Admitting: Physician Assistant

## 2024-05-14 ENCOUNTER — Encounter: Payer: Self-pay | Admitting: Internal Medicine

## 2024-05-14 ENCOUNTER — Ambulatory Visit (INDEPENDENT_AMBULATORY_CARE_PROVIDER_SITE_OTHER): Payer: Medicare Other | Admitting: Internal Medicine

## 2024-05-14 ENCOUNTER — Encounter: Payer: Self-pay | Admitting: Physician Assistant

## 2024-05-14 VITALS — BP 123/74

## 2024-05-14 VITALS — BP 118/76 | HR 75 | Temp 97.6°F | Ht 66.0 in | Wt 209.0 lb

## 2024-05-14 DIAGNOSIS — L608 Other nail disorders: Secondary | ICD-10-CM

## 2024-05-14 DIAGNOSIS — E1169 Type 2 diabetes mellitus with other specified complication: Secondary | ICD-10-CM | POA: Diagnosis not present

## 2024-05-14 DIAGNOSIS — Z7984 Long term (current) use of oral hypoglycemic drugs: Secondary | ICD-10-CM

## 2024-05-14 DIAGNOSIS — D485 Neoplasm of uncertain behavior of skin: Secondary | ICD-10-CM | POA: Diagnosis not present

## 2024-05-14 DIAGNOSIS — R350 Frequency of micturition: Secondary | ICD-10-CM | POA: Insufficient documentation

## 2024-05-14 DIAGNOSIS — E785 Hyperlipidemia, unspecified: Secondary | ICD-10-CM | POA: Diagnosis not present

## 2024-05-14 DIAGNOSIS — B353 Tinea pedis: Secondary | ICD-10-CM

## 2024-05-14 DIAGNOSIS — E559 Vitamin D deficiency, unspecified: Secondary | ICD-10-CM

## 2024-05-14 DIAGNOSIS — E538 Deficiency of other specified B group vitamins: Secondary | ICD-10-CM | POA: Diagnosis not present

## 2024-05-14 DIAGNOSIS — I10 Essential (primary) hypertension: Secondary | ICD-10-CM

## 2024-05-14 DIAGNOSIS — K5909 Other constipation: Secondary | ICD-10-CM | POA: Diagnosis not present

## 2024-05-14 DIAGNOSIS — K219 Gastro-esophageal reflux disease without esophagitis: Secondary | ICD-10-CM | POA: Diagnosis not present

## 2024-05-14 DIAGNOSIS — G43001 Migraine without aura, not intractable, with status migrainosus: Secondary | ICD-10-CM | POA: Diagnosis not present

## 2024-05-14 DIAGNOSIS — B001 Herpesviral vesicular dermatitis: Secondary | ICD-10-CM | POA: Insufficient documentation

## 2024-05-14 LAB — URINALYSIS, ROUTINE W REFLEX MICROSCOPIC
Bilirubin Urine: NEGATIVE
Hgb urine dipstick: NEGATIVE
Leukocytes,Ua: NEGATIVE
Nitrite: NEGATIVE
RBC / HPF: NONE SEEN (ref 0–?)
Specific Gravity, Urine: 1.025 (ref 1.000–1.030)
Total Protein, Urine: NEGATIVE
Urine Glucose: NEGATIVE
Urobilinogen, UA: 1 (ref 0.0–1.0)
pH: 6 (ref 5.0–8.0)

## 2024-05-14 LAB — MICROALBUMIN / CREATININE URINE RATIO
Creatinine,U: 178.2 mg/dL
Microalb Creat Ratio: 6.3 mg/g (ref 0.0–30.0)
Microalb, Ur: 1.1 mg/dL (ref 0.0–1.9)

## 2024-05-14 LAB — COMPREHENSIVE METABOLIC PANEL WITH GFR
ALT: 8 U/L (ref 0–35)
AST: 14 U/L (ref 0–37)
Albumin: 3.7 g/dL (ref 3.5–5.2)
Alkaline Phosphatase: 129 U/L — ABNORMAL HIGH (ref 39–117)
BUN: 13 mg/dL (ref 6–23)
CO2: 28 meq/L (ref 19–32)
Calcium: 9.7 mg/dL (ref 8.4–10.5)
Chloride: 105 meq/L (ref 96–112)
Creatinine, Ser: 0.73 mg/dL (ref 0.40–1.20)
GFR: 83.3 mL/min (ref >60.00)
Glucose, Bld: 88 mg/dL (ref 70–99)
Potassium: 4.1 meq/L (ref 3.5–5.1)
Sodium: 139 meq/L (ref 135–145)
Total Bilirubin: 0.3 mg/dL (ref 0.2–1.2)
Total Protein: 6.9 g/dL (ref 6.0–8.3)

## 2024-05-14 LAB — CBC WITH DIFFERENTIAL/PLATELET
Basophils Absolute: 0 K/uL (ref 0.0–0.1)
Basophils Relative: 0.6 % (ref 0.0–3.0)
Eosinophils Absolute: 0.1 K/uL (ref 0.0–0.7)
Eosinophils Relative: 1.1 % (ref 0.0–5.0)
HCT: 36.7 % (ref 36.0–46.0)
Hemoglobin: 12 g/dL (ref 12.0–15.0)
Lymphocytes Relative: 28.4 % (ref 12.0–46.0)
Lymphs Abs: 1.7 K/uL (ref 0.7–4.0)
MCHC: 32.5 g/dL (ref 30.0–36.0)
MCV: 85 fl (ref 78.0–100.0)
Monocytes Absolute: 0.5 K/uL (ref 0.1–1.0)
Monocytes Relative: 8.1 % (ref 3.0–12.0)
Neutro Abs: 3.7 K/uL (ref 1.4–7.7)
Neutrophils Relative %: 61.8 % (ref 43.0–77.0)
Platelets: 272 K/uL (ref 150.0–400.0)
RBC: 4.32 Mil/uL (ref 3.87–5.11)
RDW: 15 % (ref 11.5–15.5)
WBC: 6.1 K/uL (ref 4.0–10.5)

## 2024-05-14 LAB — HEMOGLOBIN A1C: Hgb A1c MFr Bld: 6.1 % (ref 4.6–6.5)

## 2024-05-14 LAB — VITAMIN D 25 HYDROXY (VIT D DEFICIENCY, FRACTURES): VITD: 44.74 ng/mL (ref 30.00–100.00)

## 2024-05-14 LAB — LIPID PANEL
Cholesterol: 147 mg/dL (ref 0–200)
HDL: 52.6 mg/dL (ref >39.00)
LDL Cholesterol: 77 mg/dL (ref 0–99)
NonHDL: 94.01
Total CHOL/HDL Ratio: 3
Triglycerides: 86 mg/dL (ref 0.0–149.0)
VLDL: 17.2 mg/dL (ref 0.0–40.0)

## 2024-05-14 LAB — VITAMIN B12: Vitamin B-12: >1500 pg/mL — ABNORMAL HIGH (ref 211–911)

## 2024-05-14 MED ORDER — CYANOCOBALAMIN 1000 MCG/ML IJ SOLN
1000.0000 ug | Freq: Once | INTRAMUSCULAR | Status: AC
Start: 2024-05-14 — End: 2024-05-14
  Administered 2024-05-14: 1000 ug via INTRAMUSCULAR

## 2024-05-14 MED ORDER — VALACYCLOVIR HCL 1 G PO TABS: ORAL_TABLET | ORAL | 5 refills | Status: AC

## 2024-05-14 NOTE — Progress Notes (Signed)
   New Patient Visit   Subjective  Erica [name] is a 68 y.o. female who presents for the following: Nail issue of right middle finger v ~1 year. She does not recall any injury to the nail. She went to a [facility] several months ago and they took a sample for nail clipping but she has yet to get the results after calling several times for the results. She also has peeling of her feet.     The following portions of the chart were reviewed this encounter and updated as appropriate: medications, allergies, medical history  Review of Systems:  No other skin or systemic complaints except as noted in HPI or Assessment and Plan.  Objective  Well appearing patient in no apparent distress; mood and affect are within normal limits.   A focused examination was performed of the following areas: Hands and feet to include the nails.    Relevant exam findings are noted in the Assessment and Plan.    Assessment & Plan   TINEA PEDIS Exam: Scaling and erythema on both lateral soles.  Treatment Plan: Recommend OTC Lamisil cream to feet daily.  Melanonychia Exam: Fingernails  Treatment Plan: No treatment necessary  NEOPLASM OF UNCERTAIN BEHAVIOR OF SKIN Right 3rd Fingernail Melanonychia vs Melanoma   Will plan nail biopsy with Dr Corey on follow up MELANONYCHIA    Return for nail biopsy with Dr Corey (next mid morning procedure).    Documentation: I have reviewed the above documentation for accuracy and completeness, and I agree with the above.  Vishaal Strollo K, PA-C

## 2024-05-14 NOTE — Assessment & Plan Note (Signed)
 Chronic Taking vitamin D daily Check vitamin D level

## 2024-05-14 NOTE — Assessment & Plan Note (Signed)
 Chronic Treatment with monthly B12 injections B12 injection today

## 2024-05-14 NOTE — Assessment & Plan Note (Addendum)
 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

## 2024-05-14 NOTE — Patient Instructions (Signed)

## 2024-05-14 NOTE — Assessment & Plan Note (Signed)
 Chronic Lab Results  Component Value Date   LDLCALC 80 11/15/2023   Regular exercise and healthy diet encouraged Check lipid panel, CMP Continue Crestor  20 mg daily

## 2024-05-14 NOTE — Assessment & Plan Note (Signed)
 Chronic Blood pressure well controlled CMP, CBC Continue atenolol  25 mg bid

## 2024-05-14 NOTE — Assessment & Plan Note (Signed)
 Acute Has noticed increased urinary frequency UA, urine culture to rule out UTI

## 2024-05-14 NOTE — Assessment & Plan Note (Signed)
Chronic Infrequent migraines Continue Treximet as needed 

## 2024-05-14 NOTE — Assessment & Plan Note (Signed)
Chronic GERD controlled Continue omeprazole 20 mg twice daily as needed

## 2024-05-14 NOTE — Assessment & Plan Note (Signed)
 Acute Has a cold sore on her right lower lip Has occasional cold sores but not often Valtrex  2000 mg every 12 hours x 1 day for outbreaks

## 2024-05-14 NOTE — Assessment & Plan Note (Signed)
 Chronic With hyperlipidemia  Lab Results  Component Value Date   HGBA1C 6.1 11/15/2023   Sugars well controlled Check A1c, urine albumin/creatinine Continue rybelsus  14 mg daily Stressed regular exercise, diabetic diet She is losing weight with the help of Rybelsus 

## 2024-05-15 ENCOUNTER — Encounter: Payer: Self-pay | Admitting: Physician Assistant

## 2024-05-15 LAB — URINE CULTURE: Result:: NO GROWTH

## 2024-05-15 NOTE — Progress Notes (Unsigned)
 [name] [address] [place] [address] Phone: [phone] Subjective:   LILLETTE Berwyn [name], am serving as a scribe for Dr. Arthea Claudene.  I'm seeing this patient by the request  of:  Geofm Glade PARAS, MD  CC: Left leg pain, left hip pain  YEP:Dlagzrupcz  02/15/2024 B12 injection given today     Chronic problem with worsening symptoms.  Doing very well with the weight loss.  Encouraged her to continue to stay active.  Do feel that the amount of walking could be potentially contributing and encouraged more of the hip abductor and crosstraining somewhat.  Patient will increase activity slowly.  Discussed icing regimen and home exercises.  Increase activity slowly.  Follow-up with me again in 6 to 8 weeks.     Updated 05/16/2024 Erica [name] is a 68 y.o. female coming in with complaint of L hip and R knee pain. Patient having increase in pain due to weather. Pain seems to be occurring in lateral L calf as well. Denies any increase in swelling.   Dermatology is going to remove nail on Monday.        Past Medical History:  Diagnosis Date   Abnormal stress test    a. 09/2016: NST showed a small defect of mild severity present in the basal inferoseptal and mid inferoseptal location, consistent with ischemia. --> medically managed   ALLERGIC RHINITIS    Anxiety    Bursitis    DDD (degenerative disc disease), lumbar    ESI with Ramos (spring 2016)   Depression    Diabetes mellitus without complication (HCC)    Type II   Dyslipidemia    External hemorrhoids    GERD (gastroesophageal reflux disease)    Hypertension    Obesity    Osteoarthritis    Palpitations    a. prior event monitor showing sinus tachycardia, no PAF.    Panic attacks    Hx of depression   Sleep apnea    No Cpap   Past Surgical History:  Procedure Laterality Date   ABDOMINAL HYSTERECTOMY     DILATION AND CURETTAGE OF UTERUS     DRUG INDUCED ENDOSCOPY N/A 11/29/2023    Procedure: DRUG INDUCED ENDOSCOPY;  Surgeon: Jude Harden GAILS, MD;  Location: [facility];  Service: Pulmonary;  Laterality: N/A;   MASS EXCISION Left 12/30/2015   Procedure: EXCISION LEFT LEG MASS;  Surgeon: Donnice Lima, MD;  Location: [facility];  Service: General;  Laterality: Left;   NASAL TURBINATE REDUCTION Bilateral 03/04/2022   Procedure: TURBINATE REDUCTION;  Surgeon: Carlie Clark, MD;  Location: [facility];  Service: ENT;  Laterality: Bilateral;   REPLACEMENT TOTAL KNEE Right    RIGHT OOPHORECTOMY     No cancer   Social History   Socioeconomic History   Marital status: Married    Spouse name: Amelie   Number of children: 3   Years of education: Not on file   Highest education level: Not on file  Occupational History   Occupation: Disabled  Tobacco Use   Smoking status: Never   Smokeless tobacco: Never   Tobacco comments:    tried to as a teenager  Vaping Use   Vaping status: Never Used  Substance and Sexual Activity   Alcohol use: Yes    Alcohol/week: 1.0 standard drink of alcohol    Types: 1 Glasses of wine per week    Comment: one drink a month   Drug use: No  Sexual activity: Not Currently  Other Topics Concern   Not on file  Social History Narrative   Married with children. Pt is on disablity. Previous worked in the school system   Social Drivers of Corporate investment banker Strain: Low Risk  (08/30/2023)   Overall Financial Resource Strain (CARDIA)    Difficulty of Paying Living Expenses: Not hard at all  Food Insecurity: No Food Insecurity (08/30/2023)   Hunger Vital Sign    Worried About Running Out of Food in the Last Year: Never true    Ran Out of Food in the Last Year: Never true  Transportation Needs: No Transportation Needs (08/30/2023)   PRAPARE - Administrator, Civil Service (Medical): No    Lack of Transportation (Non-Medical): No  Physical Activity: Inactive (08/30/2023)   Exercise Vital Sign    Days of Exercise  per Week: 0 days    Minutes of Exercise per Session: 0 min  Stress: Stress Concern Present (08/30/2023)   [employer] of Occupational Health - Occupational Stress Questionnaire    Feeling of Stress : To some extent  Social Connections: Moderately Integrated (08/30/2023)   Social Connection and Isolation Panel    Frequency of Communication with Friends and Family: More than three times a week    Frequency of Social Gatherings with Friends and Family: Twice a week    Attends Religious Services: More than 4 times per year    Active Member of [employer] or Organizations: No    Attends Banker Meetings: Never    Marital Status: Married   Allergies  Allergen Reactions   Seroquel [Quetiapine Fumarate] Other (See Comments)    Hallucinations, palpitations   Codeine Nausea And Vomiting   Guaifenesin -Codeine Other (See Comments)     feels spacey   Wellbutrin [Bupropion] Palpitations    hallucinations   Family History  Problem Relation Age of Onset   Heart disease Mother        Enlarged Heart, Pacemaker   Diabetes Mother    Thyroid  disease Mother    Hyperlipidemia Mother    Hypertension Mother    Sleep apnea Mother    Dementia Father    Kidney failure Father    Prostate cancer Father    Alcoholism Father    Asthma Other    Allergies Sister    Asthma Son    Breast cancer Maternal Aunt        cousin   Colon cancer Cousin    Heart disease Son        CHF, morbid obesity   Prostate cancer Maternal Uncle    Diabetes Son     Current Outpatient Medications (Endocrine & Metabolic):    RYBELSUS  14 MG TABS, TAKE ONE TABLET BY MOUTH EVERY DAY - (TAKE WITH NO MORE THAN 4 OUNCES OF PLAIN WATER AT LEAST 30 MINUTES BEFORE THE FIRST FOOD, BEVERAGE, OR OTHER MEDICATIONS OF THE DAY)  Current Outpatient Medications (Cardiovascular):    atenolol  (TENORMIN ) 25 MG tablet, Take 1 tablet (25 mg total) by mouth 2 (two) times daily.   CRESTOR  20 MG tablet, TAKE ONE TABLET BY MOUTH EVERY  DAY   diltiazem  (CARDIZEM ) 30 MG tablet, Take 1 tablet (30 mg total) by mouth at bedtime.   furosemide  (LASIX ) 40 MG tablet, TAKE ONE TABLET BY MOUTH EVERY DAY  Current Outpatient Medications (Respiratory):    albuterol  (VENTOLIN  HFA) 108 (90 Base) MCG/ACT inhaler, Inhale 2 puffs into the lungs every 6 (six) hours as  needed for wheezing or shortness of breath.   fluticasone  (FLONASE ) 50 MCG/ACT nasal spray, INSTILL 2 SPRAYS IN EACH NOSTRIL EVERY DAY   levocetirizine (XYZAL ) 5 MG tablet, TAKE ONE TABLET BY MOUTH EVERY DAY IN THE EVENING  Current Outpatient Medications (Analgesics):    SUMAtriptan -naproxen  (TREXIMET ) 85-500 MG tablet, Take 1 pill as needed for migraine, if migraine persists may repeat x1 after 2 hours  Current Outpatient Medications (Hematological):    cyanocobalamin  (,VITAMIN B-12,) 1000 MCG/ML injection, Inject 1,000 mcg into the muscle every 30 (thirty) days.  Current Outpatient Medications (Other):    clonazePAM (KLONOPIN) 0.5 MG tablet, Take 0.5 mg by mouth 2 (two) times daily as needed.   cyclobenzaprine  (FLEXERIL ) 5 MG tablet, Take 1 tablet (5 mg total) by mouth at bedtime.   gabapentin  (NEURONTIN ) 100 MG capsule, Take 1 capsule (100 mg total) by mouth at bedtime.   hydrOXYzine  (ATARAX ) 25 MG tablet, Take 25 mg by mouth 3 (three) times daily as needed for itching (allergies).   LINZESS  290 MCG CAPS capsule, TAKE ONE CAPSULE BY MOUTH EVERY DAY BEFORE BREAKFAST   Multiple Vitamins-Minerals (MULTIVITAMIN WITH MINERALS) tablet, Take 1 tablet by mouth daily.   omeprazole  (PRILOSEC) 20 MG capsule, Take 1 capsule (20 mg total) by mouth 2 (two) times daily as needed (take 30 minutes prior to a meal).   ondansetron  (ZOFRAN ) 4 MG tablet, Take 1 tablet (4 mg total) by mouth every 8 (eight) hours as needed for nausea or vomiting.   potassium chloride  SA (KLOR-CON  M) 20 MEQ tablet, TAKE ONE TABLET BY MOUTH EVERY DAY   Tart Cherry (TART CHERRY ULTRA) 1200 MG CAPS, Take 1 capsule by  mouth every evening.   TURMERIC PO, Take 1 capsule by mouth daily. Vita b-12  & b-6   valACYclovir  (VALTREX ) 1000 MG tablet, Take 2000 mg Q 12 hr x 1 day for outbreak   venlafaxine  XR (EFFEXOR -XR) 150 MG 24 hr capsule, Take 1 capsule (150 mg total) by mouth 2 (two) times daily.   Vitamin D , Ergocalciferol , (DRISDOL ) 1.25 MG (50000 UNIT) CAPS capsule, Take 1 capsule (50,000 Units total) by mouth every 7 (seven) days.   Reviewed prior external information including notes and imaging from  primary care provider As well as notes that were available from care everywhere and other healthcare systems.  Past medical history, social, surgical and family history all reviewed in electronic medical record.  No pertanent information unless stated regarding to the chief complaint.   Review of Systems:  No headache, visual changes, nausea, vomiting, diarrhea, constipation, dizziness, abdominal pain, skin rash, fevers, chills, night sweats, weight loss, swollen lymph nodes, body aches, joint swelling, chest pain, shortness of breath, mood changes. POSITIVE muscle aches  Objective  Blood pressure 112/78, pulse 73, height 5' 6 (1.676 m), weight 209 lb (94.8 kg), SpO2 98%.   General: No apparent distress alert and oriented x3 mood and affect normal, dressed appropriately.  HEENT: Pupils equal, extraocular movements intact  Respiratory: Patient's speak in full sentences and does not appear short of breath  Cardiovascular: No lower extremity edema, non tender, no erythema  Patient's left hip severely tender to palpation noted over the greater trochanteric area. Right knee replacement still has effusion noted.   After verbal consent patient was prepped with alcohol swab and with a 21-gauge 2 inch needle injected into the left greater trochanteric area with 2 cc of 0.5% Marcaine  and 1 cc of Kenalog  40 mg/mL.  No blood loss.  Band-Aid placed.  Postinjection instructions given    Impression and  Recommendations:     The above documentation has been reviewed and is accurate and complete Marizol Borror M Terree Gaultney, DO

## 2024-05-16 ENCOUNTER — Ambulatory Visit: Admitting: Family Medicine

## 2024-05-16 VITALS — BP 112/78 | HR 73 | Ht 66.0 in | Wt 209.0 lb

## 2024-05-16 DIAGNOSIS — E559 Vitamin D deficiency, unspecified: Secondary | ICD-10-CM

## 2024-05-16 DIAGNOSIS — M545 Low back pain, unspecified: Secondary | ICD-10-CM

## 2024-05-16 DIAGNOSIS — M7062 Trochanteric bursitis, left hip: Secondary | ICD-10-CM

## 2024-05-16 NOTE — Assessment & Plan Note (Signed)
 Will continue to monitor for any signs and symptoms.

## 2024-05-16 NOTE — Assessment & Plan Note (Signed)
 Continue supplementation  ?

## 2024-05-16 NOTE — Assessment & Plan Note (Addendum)
 Chronic problem with worsening symptoms.  Differential includes still with the lumbar radiculopathy.  Patient is continuing to stay active but unfortunately continues to compensate for her knee.  Continues to have difficulty with daily activities and sometimes sleeping.  Still wants to hold on any other further imaging.  Will follow-up again in 3 months

## 2024-05-16 NOTE — Patient Instructions (Addendum)
 Injection in hip Heel lifts in shoes See me again in 3 months

## 2024-05-17 ENCOUNTER — Ambulatory Visit: Payer: Self-pay | Admitting: Internal Medicine

## 2024-05-21 ENCOUNTER — Encounter: Payer: Self-pay | Admitting: Dermatology

## 2024-05-21 ENCOUNTER — Ambulatory Visit (INDEPENDENT_AMBULATORY_CARE_PROVIDER_SITE_OTHER): Admitting: Dermatology

## 2024-05-21 VITALS — BP 126/75 | HR 76

## 2024-05-21 DIAGNOSIS — L608 Other nail disorders: Secondary | ICD-10-CM | POA: Diagnosis not present

## 2024-05-21 DIAGNOSIS — B351 Tinea unguium: Secondary | ICD-10-CM | POA: Diagnosis not present

## 2024-05-21 DIAGNOSIS — D485 Neoplasm of uncertain behavior of skin: Secondary | ICD-10-CM | POA: Diagnosis not present

## 2024-05-21 DIAGNOSIS — L814 Other melanin hyperpigmentation: Secondary | ICD-10-CM

## 2024-05-21 MED ORDER — OXYCODONE HCL 5 MG PO TABS: 5.0000 mg | ORAL_TABLET | Freq: Four times a day (QID) | ORAL | 0 refills | Status: DC | PRN

## 2024-05-21 MED ORDER — MUPIROCIN 2 % EX OINT: 1.0000 | TOPICAL_OINTMENT | Freq: Two times a day (BID) | CUTANEOUS | 0 refills | Status: DC

## 2024-05-21 NOTE — Patient Instructions (Signed)

## 2024-05-21 NOTE — Progress Notes (Signed)
   Follow-Up Visit   Subjective  Erica [name] is a 68 y.o. female who presents for the following: nail biopsy.  Patient has had acute changes in 3rd fingernail in the last month. She denies any trauma to the area. The patient I referred by Erminio Like, PA-C.  The following portions of the chart were reviewed this encounter and updated as appropriate: medications, allergies, medical history  Review of Systems:  No other skin or systemic complaints except as noted in HPI or Assessment and Plan.  Objective  Well appearing patient in no apparent distress; mood and affect are within normal limits.  A focused examination was performed of the following areas: Right 3rd fingernail  Relevant exam findings are noted in the Assessment and Plan.       Assessment & Plan   Neoplasm of uncertain behavior of Nail- Ddx Subungual hemorrhage vs melanocytic neoplasm vs longitudinal melanonychia vs infectious - Discussed option nail biopsy and avulsion today; patient opts to proceed with nail biopsy today - Risks and benefits of nail biopsy including pain, permanent nail dystrophy, numbness, bleeding, infection discussed with patient; patient agreeable and would like to proceed with the procedure today  Nail biopsy procedure- right 3rd fingernail The patient was placed in a supine position, and the affected digit was prepped and draped in a sterile fashion. After administering a digital nerve block for local anesthesia with 1% lidocaine  with epinephrine , the nail plate was carefully loosened using a nail elevator and then fully avulsed to expose the underlying nail bed and matrix. Hemostasis was achieved with gentle pressure and, when needed, cautery. A small wedge of tissue was then excised from the nail matrix using a scalpel and 3 mm punch for biopsy purposes. Gelfoam was placed over the biopsy site to assist with hemostasis. The proximal nail fold was then approximated and closed using a 5-0  fast gut suture. The wound was irrigated and dressed with a sterile bandage. The patient tolerated the procedure well without complications and was given appropriate post-procedure care instructions.  NEOPLASM OF UNCERTAIN BEHAVIOR OF SKIN Right 3rd Fingernail Skin / nail biopsy Type of biopsy: punch   Informed consent: discussed and consent obtained   Timeout: patient name, date of birth, surgical site, and procedure verified   Anesthesia: the lesion was anesthetized in a standard fashion   Anesthetic:  1% lidocaine  w/ epinephrine  1-100,000 buffered w/ 8.4% NaHCO3 Punch size:  3 mm Outcome: patient tolerated procedure well   Post-procedure details: sterile dressing applied and wound care instructions given   Dressing type: petrolatum and pressure dressing    Specimen 1 - Surgical pathology Differential Diagnosis: R/O Melanonychia vs Melanoma. 2 pieces of nail matrix and nail   Check Margins: No Related Medications oxyCODONE  (OXY IR/ROXICODONE ) 5 MG immediate release tablet Take 1 tablet (5 mg total) by mouth every 6 (six) hours as needed for up to 8 doses. LONGITUDINAL MELANONYCHIA    Return in about 2 weeks (around 06/04/2024) for wound check fingernail.  LILLETTE Rollene Gobble, RN, am acting as scribe for RUFUS CHRISTELLA HOLY, MD .   Documentation: I have reviewed the above documentation for accuracy and completeness, and I agree with the above.  RUFUS CHRISTELLA HOLY, MD

## 2024-05-28 ENCOUNTER — Ambulatory Visit: Payer: Self-pay | Admitting: Dermatology

## 2024-05-28 LAB — SURGICAL PATHOLOGY

## 2024-06-06 ENCOUNTER — Encounter: Payer: Self-pay | Admitting: Dermatology

## 2024-06-06 ENCOUNTER — Encounter: Payer: Self-pay | Admitting: Physician Assistant

## 2024-06-06 ENCOUNTER — Ambulatory Visit (INDEPENDENT_AMBULATORY_CARE_PROVIDER_SITE_OTHER): Admitting: Physician Assistant

## 2024-06-06 ENCOUNTER — Other Ambulatory Visit: Payer: Self-pay | Admitting: Physician Assistant

## 2024-06-06 ENCOUNTER — Ambulatory Visit (INDEPENDENT_AMBULATORY_CARE_PROVIDER_SITE_OTHER): Admitting: Dermatology

## 2024-06-06 DIAGNOSIS — D229 Melanocytic nevi, unspecified: Secondary | ICD-10-CM

## 2024-06-06 DIAGNOSIS — B351 Tinea unguium: Secondary | ICD-10-CM | POA: Diagnosis not present

## 2024-06-06 DIAGNOSIS — T1490XD Injury, unspecified, subsequent encounter: Secondary | ICD-10-CM

## 2024-06-06 DIAGNOSIS — D2261 Melanocytic nevi of right upper limb, including shoulder: Secondary | ICD-10-CM

## 2024-06-06 DIAGNOSIS — L853 Xerosis cutis: Secondary | ICD-10-CM

## 2024-06-06 DIAGNOSIS — L608 Other nail disorders: Secondary | ICD-10-CM

## 2024-06-06 DIAGNOSIS — S61204D Unspecified open wound of right ring finger without damage to nail, subsequent encounter: Secondary | ICD-10-CM

## 2024-06-06 DIAGNOSIS — B353 Tinea pedis: Secondary | ICD-10-CM

## 2024-06-06 MED ORDER — FLUCONAZOLE 200 MG PO TABS: 200.0000 mg | ORAL_TABLET | Freq: Every day | ORAL | 0 refills | Status: DC

## 2024-06-06 MED ORDER — FLUCONAZOLE 200 MG PO TABS
200.0000 mg | ORAL_TABLET | ORAL | 0 refills | Status: DC
Start: 2024-06-06 — End: 2024-07-06

## 2024-06-06 MED ORDER — OXYCODONE HCL 5 MG PO TABS: 5.0000 mg | ORAL_TABLET | Freq: Four times a day (QID) | ORAL | 0 refills | Status: DC | PRN

## 2024-06-06 NOTE — Patient Instructions (Signed)

## 2024-06-06 NOTE — Patient Instructions (Signed)

## 2024-06-06 NOTE — Progress Notes (Signed)
    Follow-Up Visit   Subjective  Erica [name] is a 68 y.o. female who presents for the following: follow up on nail biopsy performed on 05/21/24  Pt here today for follow up of nail bx one 3rd right finger that came back as a melanocytic nevus and onychomycosis.   The following portions of the chart were reviewed this encounter and updated as appropriate: medications, allergies, medical history  Review of Systems:  No other skin or systemic complaints except as noted in HPI or Assessment and Plan.  Objective  Well appearing patient in no apparent distress; mood and affect are within normal limits.  A focused examination was performed of the following areas: fingernail  Relevant exam findings are noted in the Assessment and Plan.    Assessment & Plan   ONYCHOMYCOSIS with Benign Nevus and Healing Wound Exam: Thickened 3rd fingernail with subungal debris c/w onychomycosis  Treatment Plan: Benign-per bx.  Observation.  Patient deferred treatment at this time.    Return in about 4 weeks (around 07/04/2024) for wound check.  I, Darice Smock, [name], am acting as scribe for RUFUS CHRISTELLA HOLY, MD.   Documentation: I have reviewed the above documentation for accuracy and completeness, and I agree with the above.  RUFUS CHRISTELLA HOLY, MD

## 2024-06-06 NOTE — Progress Notes (Signed)
   Follow-Up Visit   Subjective  Erica [name] is a 68 y.o. female who presents for the following: Tinea Pedis follow up - she is using OTC Lamisil cream but it does not seem to be helping the dryness. It is less itchy. Also concerned about toenail fungus.    The following portions of the chart were reviewed this encounter and updated as appropriate: medications, allergies, medical history  Review of Systems:  No other skin or systemic complaints except as noted in HPI or Assessment and Plan.  Objective  Well appearing patient in no apparent distress; mood and affect are within normal limits.   A focused examination was performed of the following areas: Feet  Relevant exam findings are noted in the Assessment and Plan.    Assessment & Plan   ONYCHOMYCOSIS Exam: Thickened toenails with subungal debris c/w onychomycosis  Chronic and persistent condition with duration or expected duration over one year. Condition is symptomatic/ bothersome to patient. Not currently at goal.  Treatment Plan: Fluconazole  200 mg Take one tablet every Friday   TINEA PEDIS -- RESOLVED  - Reassurance that fungus is gone.    XEROSIS  - diffuse xerotic patches of feet - recommend gentle, hydrating skin care - gentle skin care handout given - recommend Amlactin lotion - sample given today.   Return for Follow up as scheduled.  I, Roseline Hutchinson, [name], am acting as scribe for SANDRIDGE,BRENDA K, PA-C .   Documentation: I have reviewed the above documentation for accuracy and completeness, and I agree with the above.  SANDRIDGE,BRENDA K, PA-C

## 2024-06-19 ENCOUNTER — Telehealth (INDEPENDENT_AMBULATORY_CARE_PROVIDER_SITE_OTHER): Payer: Self-pay | Admitting: Otolaryngology

## 2024-06-19 ENCOUNTER — Encounter (INDEPENDENT_AMBULATORY_CARE_PROVIDER_SITE_OTHER): Payer: Self-pay

## 2024-06-19 NOTE — Telephone Encounter (Signed)
 LVM for patient to call back to reschedule 09/17 appt - Dr Okey not in office.  Sent MyChart message as well.

## 2024-06-20 ENCOUNTER — Telehealth: Payer: Self-pay

## 2024-06-20 NOTE — Telephone Encounter (Signed)
 Copied from CRM #[id number]. Topic: Clinical - Order For Equipment >> Jun 20, 2024  3:36 PM [name] wrote: Reason for CRM: Erica [name] from [place] [facility] is calling to advise one touch diabetic supply no longer covered by [place] [facility]. They only cover contour plus blue meter, Contour next ez, contour next gen meter, and the contour next one or accu chek guide or guide me.

## 2024-06-25 ENCOUNTER — Telehealth (INDEPENDENT_AMBULATORY_CARE_PROVIDER_SITE_OTHER): Payer: Self-pay | Admitting: Otolaryngology

## 2024-06-25 NOTE — Telephone Encounter (Signed)
 LVM for patient to call back to reschedule 09/17 appointment.  Dr. Okey not in office.

## 2024-06-27 ENCOUNTER — Encounter (INDEPENDENT_AMBULATORY_CARE_PROVIDER_SITE_OTHER): Admitting: Otolaryngology

## 2024-06-28 ENCOUNTER — Ambulatory Visit (INDEPENDENT_AMBULATORY_CARE_PROVIDER_SITE_OTHER): Admitting: Otolaryngology

## 2024-06-28 VITALS — BP 110/73 | HR 76 | Temp 98.1°F | Ht 66.0 in | Wt 209.0 lb

## 2024-06-28 DIAGNOSIS — Z91198 Patient's noncompliance with other medical treatment and regimen for other reason: Secondary | ICD-10-CM

## 2024-06-28 DIAGNOSIS — J342 Deviated nasal septum: Secondary | ICD-10-CM

## 2024-06-28 DIAGNOSIS — G4733 Obstructive sleep apnea (adult) (pediatric): Secondary | ICD-10-CM | POA: Diagnosis not present

## 2024-06-28 DIAGNOSIS — Z789 Other specified health status: Secondary | ICD-10-CM

## 2024-06-28 DIAGNOSIS — J343 Hypertrophy of nasal turbinates: Secondary | ICD-10-CM

## 2024-06-28 NOTE — H&P (View-Only) (Signed)
 ENT Progress Note:   Update 06/28/2024  Discussed the use of AI scribe software for clinical note transcription with the patient, who gave verbal consent to proceed.  History of Present Illness Erica [name] is a 68 year old female who presents for a preoperative appointment for an upcoming surgery to manage obstructive sleep apnea.  She is scheduled for a surgical procedure to implant a device to manage her obstructive sleep apnea.  She inquires about the use of blood during the procedure due to her beliefs as a Jehovah's Witness, and is reassured that significant bleeding is not expected and blood transfusion is not planned.  Not on blood thinners.   Records Reviewed:  Initial Evaluation  Reason for Consult: OSA CPAP intolerance   Ref: Dr Jude Finn Pulm  HPI: Discussed the use of AI scribe software for clinical note transcription with the patient, who gave verbal consent to proceed.  History of Present Illness   Erica [name] is a 68 year old female with long-standing hx of obstructive sleep apnea who presents for a consultation regarding the [facility] implant.   She has a long-standing history of sleep apnea, diagnosed approximately ten years ago. She has been using CPAP therapy since her diagnosis but has struggled with tolerance due to recurrent sinus infections and her tendency to remove the mask during sleep. Various types of masks have been tried without improvement. She has undergone about three sleep studies, with the most recent one in August 2023, with AHI of 34. Additionally, she has undergone a drug-induced sleep endoscopy with Dr. Jude, which cleared her for Inspire procedure (no CCC noted).  She experiences palpitations and panic attacks, which are managed with atenolol . She takes one atenolol  in the morning and two in the evening. She has a history of an abnormal stress test in 2017, and she is under the care of a cardiologist. She is  scheduled for a carotid ultrasound in March, has hx of carotid stenosis.  She has a history of diabetes, which is well-controlled with Rybelsus . She recently had a physical examination where her diabetes management was noted to be effective. She has experienced significant weight loss, which she attributes to the medication, stating that it limits her to eating once a day. BMI of 25 today. Not on blood thinners denies hx of stroke.     Records Reviewed:  Op note by Dr Jude 11/29/23 DISE - passed candidate for Inspire no [facility]  Office visit with Dr Jude 11/14/2023 Obstructive Sleep Apnea (OSA) OSA with unsuccessful CPAP and dental device trials. Discussed Inspire implant, a minor surgical procedure with a remote control device. Explained risks, including potential interference with future pacemaker, but reassured that the implant is MRI compatible and placed on the right side. Shared outcome statistics: 30-40 successful implants with improved sleep quality. Patient expressed concerns but was reassured about the procedure and follow-up process. - Schedule DISE/endoscopy to evaluate airway closure - Stop Rybelsus  one week prior to endoscopy - Arrange transportation post-procedure - Plan for potential Inspire implant surgery if airway test is favorable - Coordinate with ENT surgeon for the procedure   Weight Management Patient on Rybelsus  with weight loss from 260 lbs to 218 lbs. Current BMI is within the acceptable range for the [facility] implant procedure. - Continue Rybelsus  after one-week discontinuation for endoscopy - Monitor weight and BMI regularly  Cardiology visit 12/30/2022 Erica [name] is a 68 y.o. female who presents for follow up of palpitations.  She  was in the emergency room on 7/31 for this.  It turns out there was a medication error.  She had run out of Xanax  and was actually taking Seroquel.  Her EKG was unremarkable.  High-sensitivity troponin was normal.  There was not thought to  be any cardiac etiology.   She was in the ED on 11/11 with dyspnea.  I reviewed these records for this visit.    CXR was normal.  BNP was normal.  She had palpitations.  She wore a monitor but only for about 27 hours two years ago.     Since I last saw her she has been waking up frequently with palpitations.  She has not had any this week but she had in the past few weeks perhaps 1:59 in the morning.  She will wake up and feel her heart racing for 5 minutes.  Her psychiatrist wonders if it could be panic.  She does not remember what she had trouble wearing the monitor a few years ago.  She says that the symptoms to stop to go away spontaneously and slowly over several minutes.  She says they do not last over an hour.  She denies any chest pressure, neck or arm discomfort.  She cannot bring these on.  She has had some mild lower extremity swelling  Abnormal stress test 2017  PALPITATIONS:  I am going to have her wear a 2-week monitor.  Electrolytes were unremarkable.  TSH was previously unremarkable during the workup a couple years ago.  Further management will be based on the results of the monitor.     HTN Her blood pressure is at target.  No change in therapy.    CAROTID STENOSIS She had  40 - 59% stenosis on both sides.  However, this was a couple years ago.  Last year was less than 1 to 39%.  I will repeat an carotid Doppler in September.    SLEEP APNEA She has been unable to use CPAP.   EDEMA I do not strongly suspect heart failure.  I will check a BNP level.    Past Medical History:  Diagnosis Date   Abnormal stress test    a. 09/2016: NST showed a small defect of mild severity present in the basal inferoseptal and mid inferoseptal location, consistent with ischemia. --> medically managed   ALLERGIC RHINITIS    Anxiety    Bursitis    DDD (degenerative disc disease), lumbar    ESI with Ramos (spring 2016)   Depression    Diabetes mellitus without complication (HCC)    Type II    Dyslipidemia    External hemorrhoids    GERD (gastroesophageal reflux disease)    Hypertension    Obesity    Osteoarthritis    Palpitations    a. prior event monitor showing sinus tachycardia, no PAF.    Panic attacks    Hx of depression   Sleep apnea    No Cpap    Past Surgical History:  Procedure Laterality Date   ABDOMINAL HYSTERECTOMY     DILATION AND CURETTAGE OF UTERUS     DRUG INDUCED ENDOSCOPY N/A 11/29/2023   Procedure: DRUG INDUCED ENDOSCOPY;  Surgeon: Jude Harden GAILS, MD;  Location: [facility] ENDOSCOPY;  Service: Pulmonary;  Laterality: N/A;   MASS EXCISION Left 12/30/2015   Procedure: EXCISION LEFT LEG MASS;  Surgeon: Donnice Lima, MD;  Location: [facility];  Service: General;  Laterality: Left;   NASAL TURBINATE REDUCTION Bilateral 03/04/2022  Procedure: TURBINATE REDUCTION;  Surgeon: Carlie Clark, MD;  Location: [facility] OR;  Service: ENT;  Laterality: Bilateral;   REPLACEMENT TOTAL KNEE Right    RIGHT OOPHORECTOMY     No cancer    Family History  Problem Relation Age of Onset   Heart disease Mother        Enlarged Heart, Pacemaker   Diabetes Mother    Thyroid  disease Mother    Hyperlipidemia Mother    Hypertension Mother    Sleep apnea Mother    Dementia Father    Kidney failure Father    Prostate cancer Father    Alcoholism Father    Asthma Other    Allergies Sister    Asthma Son    Breast cancer Maternal Aunt        cousin   Colon cancer Cousin    Heart disease Son        CHF, morbid obesity   Prostate cancer Maternal Uncle    Diabetes Son     Social History:  reports that she has never smoked. She has never used smokeless tobacco. She reports current alcohol use of about 1.0 standard drink of alcohol per week. She reports that she does not use drugs.  Allergies:  Allergies  Allergen Reactions   Seroquel [Quetiapine Fumarate] Other (See Comments)    Hallucinations, palpitations   Codeine Nausea And Vomiting   Guaifenesin -Codeine Other  (See Comments)     feels spacey   Wellbutrin [Bupropion] Palpitations    hallucinations    Medications: I have reviewed the patient's current medications.  The PMH, PSH, Medications, Allergies, and SH were reviewed and updated.  ROS: Constitutional: Negative for fever, weight loss and weight gain. Cardiovascular: Negative for chest pain and dyspnea on exertion. Respiratory: Is not experiencing shortness of breath at rest. Gastrointestinal: Negative for nausea and vomiting. Neurological: Negative for headaches. Psychiatric: The patient is not nervous/anxious  Blood pressure 110/73, pulse 76, temperature 98.1 F (36.7 C), height 5' 6 (1.676 m), weight 209 lb (94.8 kg), SpO2 96%. Body mass index is 33.73 kg/m.  PHYSICAL EXAM:  Exam: General: Well-developed, well-nourished Respiratory Respiratory effort: Equal inspiration and expiration without stridor Cardiovascular Peripheral Vascular: Warm extremities with equal color/perfusion Eyes: No nystagmus with equal extraocular motion bilaterally Neuro/Psych/Balance: Patient oriented to person, place, and time; Appropriate mood and affect; Gait is intact with no imbalance; Cranial nerves I-XII are intact Head and Face Inspection: Normocephalic and atraumatic without mass or lesion Palpation: Facial skeleton intact without bony stepoffs Salivary Glands: No mass or tenderness Facial Strength: Facial motility symmetric and full bilaterally ENT Pinna: External ear intact and fully developed External canal: Canal is patent with intact skin Tympanic Membrane: Clear and mobile External Nose: No scar or anatomic deformity Internal Nose: Septum is deviated to the left. No polyp, or purulence. Mucosal edema and erythema present.  Bilateral inferior turbinate hypertrophy.  Lips, Teeth, and gums: Mucosa and teeth intact and viable TMJ: No pain to palpation with full mobility Oral cavity/oropharynx: No erythema or exudate, no lesions present  Friedman IV tongue position Neck and Trachea: Midline trachea without mass or lesion Thyroid : No mass or nodularity Lymphatics: No lymphadenopathy   Studies Reviewed: Home Sleep Study 05/27/2022 AHI of 34.2 2 central/7 mixed apneas ~ 4% of total apneas hypopneas (224) BMI 40.9  Assessment/Plan: Encounter Diagnoses  Name Primary?   Intolerance of continuous positive airway pressure (CPAP) ventilation Yes   OSA (obstructive sleep apnea)    Nasal septal deviation  Hypertrophy of both inferior nasal turbinates      Assessment and Plan    Obstructive Sleep Apnea (OSA) and CPAP intolerance Long-standing OSA, diagnosed approximately ten years ago. Intolerant to CPAP therapy due to recurrent sinus infections and mask discomfort, tried different masks. Recent sleep study (August 2023) and drug-induced sleep endoscopy confirmed eligibility for [facility] implant.   OSA, moderate to severe, without multilevel collapse, with failure to tolerate PAP therapy and/or more conservative measures. Presence of smaller/absent tonsils and larger tongue position (Friedman tongue position or modified Mallampati) suggests that hypopharyngeal/retrolingual collapse is contributing to the patient's OSA. Janeth, M et al. Staging of obstructive sleep apnea/hypopnea syndrome: a guide to appropriate treatment. Laryngoscope, 2004 Mar, 114(3):454-9. PMID: [id number]) Options including positional therapy, weight loss, oral appliances, PAP and surgical correction discussed. Pt is not an ideal candidate for oral appliance due to severity of OSA Pt is a candidate for Hypoglossal nerve stimulation (Inspire therapy) based on recent DISE exam with Dr Jude (no CCC detected)  Discussed Inspire implant surgery, including device placement, wire insertion, and expected outcomes. Risks such as bleeding, infection, hypertrophic scarring, nerve praxia, and pneumothorax discussed. Device is MRI compatible. Advised avoiding strenuous  activities for four weeks post-surgery. Patient expressed understanding and willingness to proceed.  - Schedule Inspire implant surgery - Obtain insurance preapproval - Needs cardiac clearance 2/2 abnormal stress test 2017 and hx of carotid stenosis  - Provide information about the [employer] program in the after-visit summary   Palpitations Palpitations managed with atenolol . Reports infrequent palpitations recently. No history of stroke or heart attack. Follow-up with cardiologist scheduled for January 05, 2024, including carotid ultrasound. Abnormal stress test noted in 2017. - Continue atenolol  as prescribed - Follow up with cardiologist on January 05, 2024  Type 2 Diabetes Mellitus Well-controlled diabetes managed with Rybelsus . Recent physical examination indicates good control. Significant weight loss attributed to Rybelsus , leading to reduced appetite. - Continue Rybelsus  as prescribed  Update 06/28/2024 Assessment and Plan Assessment & Plan Obstructive sleep apnea, CPAP intolerance  Scheduled for hypoglossal nerve stimulator implantation. Procedure involves two incisions with expected smooth recovery and minimal bleeding. High success rates anticipated. Reassured no significant bleeding expected, aligning with her beliefs as a Jehovah's Witness and refusal of blood transfusions.  Risks and benefits of surgery were discussed and she would like to proceed   - Proceed with hypoglossal nerve stimulator implantation as scheduled. - Provided written postoperative instructions at checkout. - Advised no strenuous activities for four weeks post-surgery. - Instruct to resume medications the day after surgery unless advised otherwise by the hospital. - Allow eating and drinking post-surgery. - Instruct to remove dressing and start showering a couple of days post-surgery. - Schedule follow-up appointment two weeks post-surgery. - Confirm activation appointment on October 4th.       Elena Larry, MD Otolaryngology [facility] Phone: [phone] Fax: [phone]    06/28/2024, 3:06 PM

## 2024-06-28 NOTE — Patient Instructions (Signed)
INSPIRE POST-OPERATIVE INSTRUCTIONS:   Please review post-operative and recovery instructions below that you will need to be aware of after Inspire Implant surgery.  Please restart all of your home medications if you take anything on a daily basis.  You can resume regular diet after this procedure.  You will be scheduled for an appointment with Dr. Irene Pap to review details about surgery and to discuss the next steps.   DIET: Resume normal diet HYGIENE: Please wait until 48 hours after surgery before getting incisions on neck, chest, and torso wet. In the first 48 hours after surgery, will likely need to take sponge baths. WOUND CARE: Please leave pressure dressing on for 48 hours after surgery. Gently place antibiotic ointment over incisions 2 times per day; use clean q-tip. May place a clean bandage over incisions as needed. After 48 hours, you may get incisions wet with warm soap and water, but do not soak the incisions.  Pat area dry gently.  Immediately place antibiotic ointment. Take oral antibiotics as prescribed If skin around incision starts to get red (> 1cm), swollen, and/or more painful, please call the office ACTIVITY: Try to avoid sleeping on the side of your surgery, to the extent possible.   You may walk for exercise starting the day after surgery. For 2 weeks: Do not pick up anything greater than 5 pounds with the hand/arm that's on the same side as the surgery.  After 2 weeks, you may increase weight to 10 pounds.   Consider performing neck rolls 10 clockwise and 10 counterclockwise 3x/day. For 4 weeks, no strenuous activity (running, jogging, lifting weights, gardening, sports) or until cleared by physician.   PAIN MEDICATIONS: You will be prescribed Oxycodone for pain.   If pain is not severe, consider taking Tylenol 650mg  every 6 hours Avoid aspirin for 7 days after surgery Avoid direct heat (such as heating pads) to incision sites.   May gently place ice over  surgery sites as needed.  Please place a thin clean towel over skin first and then place ice bag over towel.  Ice for 10 minutes at a time only.  POST-OPERATIVE CLINIC APPOINTMENTS: 1 week: suture removal and wound check in the office.  1 month: device activation and wound check in the office. 2.5 months: check in visit to assess usage. 3-4 months: titration sleep study based on usage of >4 hrs/night.  4 months: final wound check in the office.  Yearly: device check at office.  SCAR CARE: After incisions have healed, you will have a scar, which will continue to evolve over the course of 12 months.  Caring for your incision scars will help them to be as minimal as possible. If you are out in the sun with incision exposure, please remember to place sunscreen over the incision and surrounding skin.   You may use vitamin E or "Scar ointment/cream" to help soften scar.  Please wait one month after surgery before starting this.

## 2024-06-28 NOTE — Progress Notes (Signed)
 ENT Progress Note:   Update 06/28/2024  Discussed the use of AI scribe software for clinical note transcription with the patient, who gave verbal consent to proceed.  History of Present Illness Erica [name] is a 68 year old female who presents for a preoperative appointment for an upcoming surgery to manage obstructive sleep apnea.  She is scheduled for a surgical procedure to implant a device to manage her obstructive sleep apnea.  She inquires about the use of blood during the procedure due to her beliefs as a Jehovah's Witness, and is reassured that significant bleeding is not expected and blood transfusion is not planned.  Not on blood thinners.   Records Reviewed:  Initial Evaluation  Reason for Consult: OSA CPAP intolerance   Ref: Dr Erica [name] Pulm  HPI: Discussed the use of AI scribe software for clinical note transcription with the patient, who gave verbal consent to proceed.  History of Present Illness   Erica [name] is a 68 year old female with long-standing hx of obstructive sleep apnea who presents for a consultation regarding the [facility] implant.   She has a long-standing history of sleep apnea, diagnosed approximately ten years ago. She has been using CPAP therapy since her diagnosis but has struggled with tolerance due to recurrent sinus infections and her tendency to remove the mask during sleep. Various types of masks have been tried without improvement. She has undergone about three sleep studies, with the most recent one in August 2023, with AHI of 34. Additionally, she has undergone a drug-induced sleep endoscopy with Dr. Jude, which cleared her for Inspire procedure (no CCC noted).  She experiences palpitations and panic attacks, which are managed with atenolol . She takes one atenolol  in the morning and two in the evening. She has a history of an abnormal stress test in 2017, and she is under the care of a cardiologist. She is  scheduled for a carotid ultrasound in March, has hx of carotid stenosis.  She has a history of diabetes, which is well-controlled with Rybelsus . She recently had a physical examination where her diabetes management was noted to be effective. She has experienced significant weight loss, which she attributes to the medication, stating that it limits her to eating once a day. BMI of 25 today. Not on blood thinners denies hx of stroke.     Records Reviewed:  Op note by Dr Erica 11/29/23 DISE - passed candidate for Inspire no [name]  Office visit with Dr Erica 11/14/2023 Obstructive Sleep Apnea (OSA) OSA with unsuccessful CPAP and dental device trials. Discussed Inspire implant, a minor surgical procedure with a remote control device. Explained risks, including potential interference with future pacemaker, but reassured that the implant is MRI compatible and placed on the right side. Shared outcome statistics: 30-40 successful implants with improved sleep quality. Patient expressed concerns but was reassured about the procedure and follow-up process. - Schedule DISE/endoscopy to evaluate airway closure - Stop Rybelsus  one week prior to endoscopy - Arrange transportation post-procedure - Plan for potential Inspire implant surgery if airway test is favorable - Coordinate with ENT surgeon for the procedure   Weight Management Patient on Rybelsus  with weight loss from 260 lbs to 218 lbs. Current BMI is within the acceptable range for the [facility] implant procedure. - Continue Rybelsus  after one-week discontinuation for endoscopy - Monitor weight and BMI regularly  Cardiology visit 12/30/2022 Erica [name] is a 68 y.o. female who presents for follow up of palpitations.  She  was in the emergency room on 7/31 for this.  It turns out there was a medication error.  She had run out of Xanax  and was actually taking Seroquel.  Her EKG was unremarkable.  High-sensitivity troponin was normal.  There was not thought to  be any cardiac etiology.   She was in the ED on 11/11 with dyspnea.  I reviewed these records for this visit.    CXR was normal.  BNP was normal.  She had palpitations.  She wore a monitor but only for about 27 hours two years ago.     Since I last saw her she has been waking up frequently with palpitations.  She has not had any this week but she had in the past few weeks perhaps 1:59 in the morning.  She will wake up and feel her heart racing for 5 minutes.  Her psychiatrist wonders if it could be panic.  She does not remember what she had trouble wearing the monitor a few years ago.  She says that the symptoms to stop to go away spontaneously and slowly over several minutes.  She says they do not last over an hour.  She denies any chest pressure, neck or arm discomfort.  She cannot bring these on.  She has had some mild lower extremity swelling  Abnormal stress test 2017  PALPITATIONS:  I am going to have her wear a 2-week monitor.  Electrolytes were unremarkable.  TSH was previously unremarkable during the workup a couple years ago.  Further management will be based on the results of the monitor.     HTN Her blood pressure is at target.  No change in therapy.    CAROTID STENOSIS She had  40 - 59% stenosis on both sides.  However, this was a couple years ago.  Last year was less than 1 to 39%.  I will repeat an carotid Doppler in September.    SLEEP APNEA She has been unable to use CPAP.   EDEMA I do not strongly suspect heart failure.  I will check a BNP level.    Past Medical History:  Diagnosis Date   Abnormal stress test    a. 09/2016: NST showed a small defect of mild severity present in the basal inferoseptal and mid inferoseptal location, consistent with ischemia. --> medically managed   ALLERGIC RHINITIS    Anxiety    Bursitis    DDD (degenerative disc disease), lumbar    ESI with Erica [name] (spring 2016)   Depression    Diabetes mellitus without complication (HCC)    Type II    Dyslipidemia    External hemorrhoids    GERD (gastroesophageal reflux disease)    Hypertension    Obesity    Osteoarthritis    Palpitations    a. prior event monitor showing sinus tachycardia, no PAF.    Panic attacks    Hx of depression   Sleep apnea    No Cpap    Past Surgical History:  Procedure Laterality Date   ABDOMINAL HYSTERECTOMY     DILATION AND CURETTAGE OF UTERUS     DRUG INDUCED ENDOSCOPY N/A 11/29/2023   Procedure: DRUG INDUCED ENDOSCOPY;  Surgeon: Erica Harden GAILS, MD;  Location: [facility] ENDOSCOPY;  Service: Pulmonary;  Laterality: N/A;   MASS EXCISION Left 12/30/2015   Procedure: EXCISION LEFT LEG MASS;  Surgeon: Donnice Lima, MD;  Location: [facility];  Service: General;  Laterality: Left;   NASAL TURBINATE REDUCTION Bilateral 03/04/2022  Procedure: TURBINATE REDUCTION;  Surgeon: Carlie Clark, MD;  Location: [facility] OR;  Service: ENT;  Laterality: Bilateral;   REPLACEMENT TOTAL KNEE Right    RIGHT OOPHORECTOMY     No cancer    Family History  Problem Relation Age of Onset   Heart disease Mother        Enlarged Heart, Pacemaker   Diabetes Mother    Thyroid  disease Mother    Hyperlipidemia Mother    Hypertension Mother    Sleep apnea Mother    Dementia Father    Kidney failure Father    Prostate cancer Father    Alcoholism Father    Asthma Other    Allergies Sister    Asthma Son    Breast cancer Maternal Aunt        cousin   Colon cancer Cousin    Heart disease Son        CHF, morbid obesity   Prostate cancer Maternal Uncle    Diabetes Son     Social History:  reports that she has never smoked. She has never used smokeless tobacco. She reports current alcohol use of about 1.0 standard drink of alcohol per week. She reports that she does not use drugs.  Allergies:  Allergies  Allergen Reactions   Seroquel [Quetiapine Fumarate] Other (See Comments)    Hallucinations, palpitations   Codeine Nausea And Vomiting   Guaifenesin -Codeine Other  (See Comments)     feels spacey   Wellbutrin [Bupropion] Palpitations    hallucinations    Medications: I have reviewed the patient's current medications.  The PMH, PSH, Medications, Allergies, and SH were reviewed and updated.  ROS: Constitutional: Negative for fever, weight loss and weight gain. Cardiovascular: Negative for chest pain and dyspnea on exertion. Respiratory: Is not experiencing shortness of breath at rest. Gastrointestinal: Negative for nausea and vomiting. Neurological: Negative for headaches. Psychiatric: The patient is not nervous/anxious  Blood pressure 110/73, pulse 76, temperature 98.1 F (36.7 C), height 5' 6 (1.676 m), weight 209 lb (94.8 kg), SpO2 96%. Body mass index is 33.73 kg/m.  PHYSICAL EXAM:  Exam: General: Well-developed, well-nourished Respiratory Respiratory effort: Equal inspiration and expiration without stridor Cardiovascular Peripheral Vascular: Warm extremities with equal color/perfusion Eyes: No nystagmus with equal extraocular motion bilaterally Neuro/Psych/Balance: Patient oriented to person, place, and time; Appropriate mood and affect; Gait is intact with no imbalance; Cranial nerves I-XII are intact Head and Face Inspection: Normocephalic and atraumatic without mass or lesion Palpation: Facial skeleton intact without bony stepoffs Salivary Glands: No mass or tenderness Facial Strength: Facial motility symmetric and full bilaterally ENT Pinna: External ear intact and fully developed External canal: Canal is patent with intact skin Tympanic Membrane: Clear and mobile External Nose: No scar or anatomic deformity Internal Nose: Septum is deviated to the left. No polyp, or purulence. Mucosal edema and erythema present.  Bilateral inferior turbinate hypertrophy.  Lips, Teeth, and gums: Mucosa and teeth intact and viable TMJ: No pain to palpation with full mobility Oral cavity/oropharynx: No erythema or exudate, no lesions present  Friedman IV tongue position Neck and Trachea: Midline trachea without mass or lesion Thyroid : No mass or nodularity Lymphatics: No lymphadenopathy   Studies Reviewed: Home Sleep Study 05/27/2022 AHI of 34.2 2 central/7 mixed apneas ~ 4% of total apneas hypopneas (224) BMI 40.9  Assessment/Plan: Encounter Diagnoses  Name Primary?   Intolerance of continuous positive airway pressure (CPAP) ventilation Yes   OSA (obstructive sleep apnea)    Nasal septal deviation  Hypertrophy of both inferior nasal turbinates      Assessment and Plan    Obstructive Sleep Apnea (OSA) and CPAP intolerance Long-standing OSA, diagnosed approximately ten years ago. Intolerant to CPAP therapy due to recurrent sinus infections and mask discomfort, tried different masks. Recent sleep study (August 2023) and drug-induced sleep endoscopy confirmed eligibility for [facility] implant.   OSA, moderate to severe, without multilevel collapse, with failure to tolerate PAP therapy and/or more conservative measures. Presence of smaller/absent tonsils and larger tongue position (Friedman tongue position or modified Mallampati) suggests that hypopharyngeal/retrolingual collapse is contributing to the patient's OSA. Janeth, M et al. Staging of obstructive sleep apnea/hypopnea syndrome: a guide to appropriate treatment. Laryngoscope, 2004 Mar, 114(3):454-9. PMID: [id number]) Options including positional therapy, weight loss, oral appliances, PAP and surgical correction discussed. Pt is not an ideal candidate for oral appliance due to severity of OSA Pt is a candidate for Hypoglossal nerve stimulation (Inspire therapy) based on recent DISE exam with Dr Erica (no CCC detected)  Discussed Inspire implant surgery, including device placement, wire insertion, and expected outcomes. Risks such as bleeding, infection, hypertrophic scarring, nerve praxia, and pneumothorax discussed. Device is MRI compatible. Advised avoiding strenuous  activities for four weeks post-surgery. Patient expressed understanding and willingness to proceed.  - Schedule Inspire implant surgery - Obtain insurance preapproval - Needs cardiac clearance 2/2 abnormal stress test 2017 and hx of carotid stenosis  - Provide information about the [employer] program in the after-visit summary   Palpitations Palpitations managed with atenolol . Reports infrequent palpitations recently. No history of stroke or heart attack. Follow-up with cardiologist scheduled for January 05, 2024, including carotid ultrasound. Abnormal stress test noted in 2017. - Continue atenolol  as prescribed - Follow up with cardiologist on January 05, 2024  Type 2 Diabetes Mellitus Well-controlled diabetes managed with Rybelsus . Recent physical examination indicates good control. Significant weight loss attributed to Rybelsus , leading to reduced appetite. - Continue Rybelsus  as prescribed  Update 06/28/2024 Assessment and Plan Assessment & Plan Obstructive sleep apnea, CPAP intolerance  Scheduled for hypoglossal nerve stimulator implantation. Procedure involves two incisions with expected smooth recovery and minimal bleeding. High success rates anticipated. Reassured no significant bleeding expected, aligning with her beliefs as a Jehovah's Witness and refusal of blood transfusions.  Risks and benefits of surgery were discussed and she would like to proceed   - Proceed with hypoglossal nerve stimulator implantation as scheduled. - Provided written postoperative instructions at checkout. - Advised no strenuous activities for four weeks post-surgery. - Instruct to resume medications the day after surgery unless advised otherwise by the hospital. - Allow eating and drinking post-surgery. - Instruct to remove dressing and start showering a couple of days post-surgery. - Schedule follow-up appointment two weeks post-surgery. - Confirm activation appointment on October 4th.       Elena Larry, MD Otolaryngology [facility] Phone: [phone] Fax: [phone]    06/28/2024, 3:06 PM

## 2024-07-04 ENCOUNTER — Ambulatory Visit (INDEPENDENT_AMBULATORY_CARE_PROVIDER_SITE_OTHER): Admitting: Dermatology

## 2024-07-04 ENCOUNTER — Encounter: Payer: Self-pay | Admitting: Dermatology

## 2024-07-04 ENCOUNTER — Ambulatory Visit: Admitting: Dermatology

## 2024-07-04 VITALS — BP 122/79 | HR 78

## 2024-07-04 DIAGNOSIS — D2260 Melanocytic nevi of unspecified upper limb, including shoulder: Secondary | ICD-10-CM

## 2024-07-04 DIAGNOSIS — B351 Tinea unguium: Secondary | ICD-10-CM | POA: Diagnosis not present

## 2024-07-04 DIAGNOSIS — L821 Other seborrheic keratosis: Secondary | ICD-10-CM | POA: Diagnosis not present

## 2024-07-04 MED ORDER — MUPIROCIN 2 % EX OINT: 1.0000 | TOPICAL_OINTMENT | Freq: Two times a day (BID) | CUTANEOUS | 1 refills | Status: AC

## 2024-07-04 NOTE — Progress Notes (Signed)
   Follow-Up Visit   Subjective  Erica [name] is a 68 y.o. female who presents for the following: wound check, s/p nail avulsion and nail matrix biopsy showing benign melanotic macule and onychomycosis.   The following portions of the chart were reviewed this encounter and updated as appropriate: medications, allergies, medical history  Review of Systems:  No other skin or systemic complaints except as noted in HPI or Assessment and Plan.  Objective  Well appearing patient in no apparent distress; mood and affect are within normal limits.   A focused examination was performed of the following areas: Right hand and face  Relevant exam findings are noted in the Assessment and Plan.    Assessment & Plan   ONYCHOMYCOSIS with Benign Nevus and Healing Wound Exam: Thickened 3rd fingernail with subungal debris c/w onychomycosis   Treatment Plan: Benign-per bx.  Observation.  Patient deferred treatment at this time.   SEBORRHEIC KERATOSIS- right face - Stuck-on, waxy, tan-brown papules and/or plaques  - Benign-appearing - Discussed benign etiology and prognosis. - Observe - Call for any changes     Return in about 4 months (around 11/03/2024) for wound check.  I, Berwyn Lesches, Surg Tech III, am acting as scribe for RUFUS CHRISTELLA HOLY, MD.   Documentation: I have reviewed the above documentation for accuracy and completeness, and I agree with the above.  RUFUS CHRISTELLA HOLY, MD

## 2024-07-04 NOTE — Patient Instructions (Signed)

## 2024-07-10 ENCOUNTER — Encounter (HOSPITAL_COMMUNITY): Payer: Self-pay

## 2024-07-10 ENCOUNTER — Other Ambulatory Visit: Payer: Self-pay

## 2024-07-10 NOTE — Anesthesia Preprocedure Evaluation (Signed)
 Anesthesia Evaluation    Airway        Dental   Pulmonary           Cardiovascular hypertension,      Neuro/Psych    GI/Hepatic   Endo/Other  diabetes    Renal/GU      Musculoskeletal   Abdominal   Peds  Hematology   Anesthesia Other Findings   Reproductive/Obstetrics                              Anesthesia Physical Anesthesia Plan  ASA:   Anesthesia Plan:    Post-op Pain Management:    Induction:   PONV Risk Score and Plan:   Airway Management Planned:   Additional Equipment:   Intra-op Plan:   Post-operative Plan:   Informed Consent:   Plan Discussed with:   Anesthesia Plan Comments: (PAT note written 07/10/2024 by Jebediah Macrae, PA-C.  )        Anesthesia Quick Evaluation

## 2024-07-10 NOTE — Progress Notes (Signed)
 Anesthesia Chart Review: Erica [name] WORK-UP  Case: [id number] Date/Time: 07/11/24 0905   Procedure: INSERTION, HYPOGLOSSAL NERVE STIMULATOR   Anesthesia type: General   Diagnosis: Obstructive sleep apnea (adult) (pediatric) [G47.33]   Pre-op diagnosis: Obstructive sleep apnea   [facility] ROOM 08 / [facility]   Surgeons: Okey Burns, MD       DISCUSSION: Patient is a 68 year old female scheduled for the above procedure.  History includes never smoker, HTN, dyslipidemia, palpitations, GERD, OSA (intolerant to CPAP), osteoarthritis (right TKA).   Last cardiology follow-up with Dr. Lavona was on 03/08/2024. She has known palpitations, suspected to be related to PVCs and PACs, but have not been able to capture on long term monitoring studies. He asked her to reduce atenolol  to 25 mg BID and added Cardizem  30 mg Q PM. Mild plaque and 07/2023 carotid US , no further imaging felt indicated. She has known LE with normal BNP in 2024. Normal LV systolic function with some diastolic function by echo in 2018. She denied any new SOB. No additional testing recommended at last visit with six month follow-up planned. He noted plans for upcoming hypoglossal nerve stimulator implant.   A1c 6.1% in August 2025. Last Rybelsus  07/09/2024.   She is a same-day workup.  Updated labs as indicated and anesthesia team evaluation on the day of surgery.   VS: Ht 5' 7 (1.702 m)   Wt 93.9 kg   BMI 32.42 kg/m   BP Readings from Last 3 Encounters:  07/04/24 122/79  06/28/24 110/73  05/21/24 126/75   Pulse Readings from Last 3 Encounters:  07/04/24 78  06/28/24 76  05/21/24 76    PROVIDERS: Geofm Glade PARAS, MD is PCP  Lavona Agent, MD is cardiologist  Jude Donning, MD is pulmonologist   LABS: Most recent lab results in [facility] include: Lab Results  Component Value Date   WBC 6.1 05/14/2024   HGB 12.0 05/14/2024   HCT 36.7 05/14/2024   PLT 272.0 05/14/2024   GLUCOSE 88 05/14/2024   CHOL 147 05/14/2024    TRIG 86.0 05/14/2024   HDL 52.60 05/14/2024   LDLCALC 77 05/14/2024   ALT 8 05/14/2024   AST 14 05/14/2024   NA 139 05/14/2024   K 4.1 05/14/2024   CL 105 05/14/2024   CREATININE 0.73 05/14/2024   BUN 13 05/14/2024   CO2 28 05/14/2024   TSH 2.79 11/15/2023   HGBA1C 6.1 05/14/2024   MICROALBUR 1.1 05/14/2024     EKG: 03/08/2024: Normal sinus rhythm Poor anterior R wave progression When compared with ECG of 14-Sep-2020 14:21, No significant change was found Confirmed by Lavona Agent ([id number]) on 03/08/2024 10:58:12 AM   CV: US  Carotid 07/12/2023: Summary:  - Right Carotid: The extracranial vessels were near-normal with only minimal wall thickening or plaque. History of FMD.  - Left Carotid: The extracranial vessels were near-normal with only minimal  wall thickening or plaque. History of FMD.  - Vertebrals:  Bilateral vertebral arteries demonstrate antegrade flow.  - Subclavians: Normal flow hemodynamics were seen in bilateral subclavian arteries.   Long term monitor 02/02/2023: Normal sinus rhythm No sustained arrhythmias Rare atrial and ventricular ectopy.   Echo 10/21/2016: Study Conclusions  - Left ventricle: The cavity size was normal. Wall thickness was    normal. Systolic function was normal. The estimated ejection    fraction was in the range of 55% to 60%. Wall motion was normal;    there were no regional wall motion abnormalities. Features are  consistent with a pseudonormal left ventricular filling pattern,    with concomitant abnormal relaxation and increased filling    pressure (grade 2 diastolic dysfunction).   Nuclear stress test 09/23/2016: Nuclear stress EF: 53%. There was no ST segment deviation noted during stress. Defect 1: There is a small defect of mild severity present in the basal inferoseptal and mid inferoseptal location. consistent with ischemia Findings consistent with ischemia. This is a low to intermediate risk study. - She was  subsequently referred to cardiologist Lavona Agent, MD. Per 10/05/2016 Progress Note, This was mildly abnormal results similar to the previous. She's not having any chest pain. She doesn't have significant risk factors. At this point I don't think further cardiovascular testing is suggested.  Past Medical History:  Diagnosis Date   Abnormal stress test    a. 09/2016: NST showed a small defect of mild severity present in the basal inferoseptal and mid inferoseptal location, consistent with ischemia. --> medically managed   ALLERGIC RHINITIS    Anxiety    Bursitis    DDD (degenerative disc disease), lumbar    ESI with Ramos (spring 2016)   Depression    Diabetes mellitus without complication (HCC)    Type II   Dyslipidemia    External hemorrhoids    GERD (gastroesophageal reflux disease)    Hypertension    Obesity    Osteoarthritis    Palpitations    a. prior event monitor showing sinus tachycardia, no PAF.    Panic attacks    Hx of depression   Sleep apnea    No Cpap    Past Surgical History:  Procedure Laterality Date   ABDOMINAL HYSTERECTOMY     DILATION AND CURETTAGE OF UTERUS     DRUG INDUCED ENDOSCOPY N/A 11/29/2023   Procedure: DRUG INDUCED ENDOSCOPY;  Surgeon: Jude Harden GAILS, MD;  Location: [facility] ENDOSCOPY;  Service: Pulmonary;  Laterality: N/A;   MASS EXCISION Left 12/30/2015   Procedure: EXCISION LEFT LEG MASS;  Surgeon: Donnice Lima, MD;  Location: [facility];  Service: General;  Laterality: Left;   NASAL TURBINATE REDUCTION Bilateral 03/04/2022   Procedure: TURBINATE REDUCTION;  Surgeon: Carlie Clark, MD;  Location[facility];  Service: ENT;  Laterality: Bilateral;   REPLACEMENT TOTAL KNEE Right    RIGHT OOPHORECTOMY     No cancer    MEDICATIONS: No current facility-administered medications for this encounter.    atenolol  (TENORMIN ) 25 MG tablet   clonazePAM (KLONOPIN) 0.5 MG tablet   CRESTOR  20 MG tablet   cyclobenzaprine  (FLEXERIL ) 5 MG tablet    diltiazem  (CARDIZEM ) 30 MG tablet   fluconazole  (DIFLUCAN ) 200 MG tablet   fluticasone  (FLONASE ) 50 MCG/ACT nasal spray   furosemide  (LASIX ) 40 MG tablet   gabapentin  (NEURONTIN ) 100 MG capsule   hydrOXYzine  (ATARAX ) 25 MG tablet   levocetirizine (XYZAL ) 5 MG tablet   LINZESS  290 MCG CAPS capsule   mupirocin  ointment (BACTROBAN ) 2 %   omeprazole  (PRILOSEC) 20 MG capsule   ondansetron  (ZOFRAN ) 4 MG tablet   potassium chloride  SA (KLOR-CON  M) 20 MEQ tablet   RYBELSUS  14 MG TABS   SUMAtriptan -naproxen  (TREXIMET ) 85-500 MG tablet   TURMERIC PO   valACYclovir  (VALTREX ) 1000 MG tablet   venlafaxine  XR (EFFEXOR -XR) 150 MG 24 hr capsule   Vitamin D , Ergocalciferol , (DRISDOL ) 1.25 MG (50000 UNIT) CAPS capsule   oxyCODONE  (OXY IR/ROXICODONE ) 5 MG immediate release tablet    Isaiah Ruder, PA-C Surgical Short Stay/Anesthesiology [facility] Phone [phone] [facility] Phone (  [phone] 07/10/2024 12:21 PM

## 2024-07-10 NOTE — Progress Notes (Signed)
 SDW call  Patient was given pre-op instructions over the phone. Patient verbalized understanding of instructions provided.     PCP - Dr. Glade Hope Cardiologist - Dr. Lynwood Schilling Pulmonary:    PPM/ICD - denies Device Orders - na Rep Notified - na   Chest x-ray - na EKG -  03/08/2024 Stress Test - 09/23/2016 ECHO - 10/21/2016 Cardiac Cath -   Sleep Study/sleep apnea/CPAP: sleep apnea, unable to tolerate CPAP  Type II diabetic. A1C 6.1 on 05/14/2024.  Waiting to receive a new glucose meter, currently does not check her blood surgar.  Fasting Blood sugar range: na How often check sugars: na  Rybelsus  (semaglutide ); hold 24 hours, states last dose 07/09/2024   Blood Thinner Instructions: denies Aspirin Instructions:denies   ERAS Protcol - NPO   Anesthesia review: Yes. DM, OSA, HTN, HLD, abnormal stress 2017   Patient denies shortness of breath, fever, cough and chest pain over the phone call  Your procedure is scheduled on Wednesday July 11, 2024  Report to [facility] Main Entrance A at  [phone]  A.M., then check in with the Admitting office.  Call this number if you have problems the morning of surgery:  [phone]   If you have any questions prior to your surgery date call [phone]: Open Monday-Friday 8am-4pm If you experience any cold or flu symptoms such as cough, fever, chills, shortness of breath, etc. between now and your scheduled surgery, please notify us  at the above number    Remember:  Do not eat or drink after midnight the night before your surgery  Take these medicines the morning of surgery with A SIP OF WATER:  Atenolol , klonopin, effexor   As needed: Flonase , hydroxyzine , zofran   As of today, STOP taking any Aspirin (unless otherwise instructed by your surgeon) Aleve , Naproxen , Ibuprofen , Motrin , Advil , Goody's, BC's, all herbal medications, fish oil, and all vitamins.

## 2024-07-11 ENCOUNTER — Other Ambulatory Visit: Payer: Self-pay

## 2024-07-11 ENCOUNTER — Encounter (HOSPITAL_COMMUNITY): Payer: Self-pay

## 2024-07-11 ENCOUNTER — Encounter (HOSPITAL_COMMUNITY)
Admission: RE | Disposition: A | Discharge status: Home / Self Care | Payer: Self-pay | Source: Home / Self Care | Attending: Otolaryngology

## 2024-07-11 ENCOUNTER — Ambulatory Visit (HOSPITAL_COMMUNITY)
Admission: RE | Admit: 2024-07-11 | Discharge: 2024-07-11 | Disposition: A | Discharge status: Home / Self Care | Attending: Otolaryngology | Admitting: Otolaryngology

## 2024-07-11 ENCOUNTER — Ambulatory Visit (HOSPITAL_COMMUNITY): Admitting: Certified Registered Nurse Anesthetist

## 2024-07-11 ENCOUNTER — Ambulatory Visit (HOSPITAL_COMMUNITY)

## 2024-07-11 DIAGNOSIS — F410 Panic disorder [episodic paroxysmal anxiety] without agoraphobia: Secondary | ICD-10-CM | POA: Insufficient documentation

## 2024-07-11 DIAGNOSIS — E785 Hyperlipidemia, unspecified: Secondary | ICD-10-CM

## 2024-07-11 DIAGNOSIS — Z789 Other specified health status: Secondary | ICD-10-CM

## 2024-07-11 DIAGNOSIS — Z96651 Presence of right artificial knee joint: Secondary | ICD-10-CM | POA: Insufficient documentation

## 2024-07-11 DIAGNOSIS — Z6832 Body mass index (BMI) 32.0-32.9, adult: Secondary | ICD-10-CM | POA: Insufficient documentation

## 2024-07-11 DIAGNOSIS — Z91198 Patient's noncompliance with other medical treatment and regimen for other reason: Secondary | ICD-10-CM | POA: Diagnosis not present

## 2024-07-11 DIAGNOSIS — G4733 Obstructive sleep apnea (adult) (pediatric): Secondary | ICD-10-CM

## 2024-07-11 DIAGNOSIS — E66811 Obesity, class 1: Secondary | ICD-10-CM | POA: Insufficient documentation

## 2024-07-11 DIAGNOSIS — Z7984 Long term (current) use of oral hypoglycemic drugs: Secondary | ICD-10-CM | POA: Diagnosis not present

## 2024-07-11 DIAGNOSIS — I10 Essential (primary) hypertension: Secondary | ICD-10-CM | POA: Insufficient documentation

## 2024-07-11 DIAGNOSIS — E119 Type 2 diabetes mellitus without complications: Secondary | ICD-10-CM

## 2024-07-11 DIAGNOSIS — Z79899 Other long term (current) drug therapy: Secondary | ICD-10-CM | POA: Insufficient documentation

## 2024-07-11 DIAGNOSIS — K219 Gastro-esophageal reflux disease without esophagitis: Secondary | ICD-10-CM | POA: Insufficient documentation

## 2024-07-11 DIAGNOSIS — R002 Palpitations: Secondary | ICD-10-CM | POA: Insufficient documentation

## 2024-07-11 DIAGNOSIS — Z9889 Other specified postprocedural states: Secondary | ICD-10-CM | POA: Diagnosis not present

## 2024-07-11 LAB — CBC
HCT: 39.2 % (ref 36.0–46.0)
Hemoglobin: 12.5 g/dL (ref 12.0–15.0)
MCH: 27.7 pg (ref 26.0–34.0)
MCHC: 31.9 g/dL (ref 30.0–36.0)
MCV: 86.7 fL (ref 80.0–100.0)
Platelets: 299 K/uL (ref 150–400)
RBC: 4.52 MIL/uL (ref 3.87–5.11)
RDW: 14.2 % (ref 11.5–15.5)
WBC: 8.1 K/uL (ref 4.0–10.5)
nRBC: 0 % (ref 0.0–0.2)

## 2024-07-11 LAB — BASIC METABOLIC PANEL WITH GFR
Anion gap: 14 (ref 5–15)
BUN: 19 mg/dL (ref 8–23)
CO2: 21 mmol/L — ABNORMAL LOW (ref 22–32)
Calcium: 9.5 mg/dL (ref 8.9–10.3)
Chloride: 101 mmol/L (ref 98–111)
Creatinine, Ser: 0.71 mg/dL (ref 0.44–1.00)
GFR, Estimated: >60 mL/min (ref >60)
Glucose, Bld: 104 mg/dL — ABNORMAL HIGH (ref 70–99)
Potassium: 3.7 mmol/L (ref 3.5–5.1)
Sodium: 136 mmol/L (ref 135–145)

## 2024-07-11 LAB — GLUCOSE, CAPILLARY
Glucose-Capillary: 118 mg/dL — ABNORMAL HIGH (ref 70–99)
Glucose-Capillary: 95 mg/dL (ref 70–99)
Glucose-Capillary: 80 mg/dL (ref 70–99)

## 2024-07-11 LAB — NO BLOOD PRODUCTS

## 2024-07-11 SURGERY — INSERTION, HYPOGLOSSAL NERVE STIMULATOR
Anesthesia: General | Site: Neck

## 2024-07-11 MED ORDER — ACETAMINOPHEN 500 MG PO TABS: 500.0000 mg | ORAL_TABLET | Freq: Four times a day (QID) | ORAL | 0 refills | Status: AC

## 2024-07-11 MED ORDER — PROPOFOL 10 MG/ML IV BOLUS
INTRAVENOUS | Status: DC | PRN
  Administered 2024-07-11: 150 mg via INTRAVENOUS

## 2024-07-11 MED ORDER — LIDOCAINE-EPINEPHRINE 1 %-1:100000 IJ SOLN
INTRAMUSCULAR | Status: DC | PRN
  Administered 2024-07-11: 8 mL

## 2024-07-11 MED ORDER — FENTANYL CITRATE (PF) 250 MCG/5ML IJ SOLN
INTRAMUSCULAR | Status: DC | PRN
  Administered 2024-07-11: 100 ug via INTRAVENOUS

## 2024-07-11 MED ORDER — LIDOCAINE-EPINEPHRINE 1 %-1:100000 IJ SOLN
INTRAMUSCULAR | Status: AC
  Filled 2024-07-11: qty 1

## 2024-07-11 MED ORDER — FENTANYL CITRATE (PF) 250 MCG/5ML IJ SOLN
INTRAMUSCULAR | Status: AC
  Filled 2024-07-11: qty 5

## 2024-07-11 MED ORDER — OXYCODONE HCL 5 MG/5ML PO SOLN
5.0000 mg | Freq: Once | ORAL | Status: AC | PRN
  Administered 2024-07-11: 5 mg via ORAL

## 2024-07-11 MED ORDER — 0.9 % SODIUM CHLORIDE (POUR BTL) OPTIME
TOPICAL | Status: DC | PRN
  Administered 2024-07-11: 1000 mL

## 2024-07-11 MED ORDER — PHENYLEPHRINE HCL-NACL 20-0.9 MG/250ML-% IV SOLN
INTRAVENOUS | Status: DC | PRN
  Administered 2024-07-11: 30 ug/min via INTRAVENOUS

## 2024-07-11 MED ORDER — ONDANSETRON HCL 4 MG/2ML IJ SOLN
INTRAMUSCULAR | Status: AC
Start: 2024-07-11 — End: 2024-07-11
  Filled 2024-07-11: qty 2

## 2024-07-11 MED ORDER — DEXAMETHASONE SODIUM PHOSPHATE 10 MG/ML IJ SOLN
INTRAMUSCULAR | Status: DC | PRN
  Administered 2024-07-11: 10 mg via INTRAVENOUS

## 2024-07-11 MED ORDER — MIDAZOLAM HCL 2 MG/2ML IJ SOLN
INTRAMUSCULAR | Status: AC
  Filled 2024-07-11: qty 2

## 2024-07-11 MED ORDER — SODIUM CHLORIDE 0.9 % IV SOLN
0.1000 ug/kg/min | INTRAVENOUS | Status: DC
  Administered 2024-07-11: .1 ug/kg/min via INTRAVENOUS
  Filled 2024-07-11: qty 5000

## 2024-07-11 MED ORDER — OXYCODONE HCL 5 MG/5ML PO SOLN
ORAL | Status: AC
  Filled 2024-07-11: qty 5

## 2024-07-11 MED ORDER — ONDANSETRON HCL 4 MG/2ML IJ SOLN
INTRAMUSCULAR | Status: DC | PRN
  Administered 2024-07-11: 4 mg via INTRAVENOUS

## 2024-07-11 MED ORDER — FENTANYL CITRATE (PF) 100 MCG/2ML IJ SOLN
INTRAMUSCULAR | Status: AC
  Filled 2024-07-11: qty 2

## 2024-07-11 MED ORDER — IBUPROFEN 600 MG PO TABS: 600.0000 mg | ORAL_TABLET | Freq: Four times a day (QID) | ORAL | 0 refills | Status: AC

## 2024-07-11 MED ORDER — SUCCINYLCHOLINE CHLORIDE 200 MG/10ML IV SOSY
PREFILLED_SYRINGE | INTRAVENOUS | Status: DC | PRN
  Administered 2024-07-11: 120 mg via INTRAVENOUS

## 2024-07-11 MED ORDER — AMISULPRIDE (ANTIEMETIC) 5 MG/2ML IV SOLN: 10.0000 mg | Freq: Once | INTRAVENOUS | Status: DC | PRN

## 2024-07-11 MED ORDER — MIDAZOLAM HCL 2 MG/2ML IJ SOLN
INTRAMUSCULAR | Status: DC | PRN
  Administered 2024-07-11: 2 mg via INTRAVENOUS

## 2024-07-11 MED ORDER — OXYCODONE HCL 5 MG PO TABS: 5.0000 mg | ORAL_TABLET | Freq: Four times a day (QID) | ORAL | 0 refills | Status: AC | PRN

## 2024-07-11 MED ORDER — SODIUM CHLORIDE 0.9 % IV SOLN
0.1000 ug/kg/min | INTRAVENOUS | Status: DC
  Filled 2024-07-11: qty 5000

## 2024-07-11 MED ORDER — LACTATED RINGERS IV SOLN: INTRAVENOUS | Status: DC

## 2024-07-11 MED ORDER — LIDOCAINE 2% (20 MG/ML) 5 ML SYRINGE
INTRAMUSCULAR | Status: DC | PRN
  Administered 2024-07-11: 60 mg via INTRAVENOUS

## 2024-07-11 MED ORDER — AMOXICILLIN-POT CLAVULANATE 875-125 MG PO TABS: 1.0000 | ORAL_TABLET | Freq: Two times a day (BID) | ORAL | 0 refills | Status: DC

## 2024-07-11 MED ORDER — OXYCODONE HCL 5 MG PO TABS: 5.0000 mg | ORAL_TABLET | Freq: Once | ORAL | Status: AC | PRN

## 2024-07-11 MED ORDER — DEXAMETHASONE SODIUM PHOSPHATE 10 MG/ML IJ SOLN
INTRAMUSCULAR | Status: AC
  Filled 2024-07-11: qty 1

## 2024-07-11 MED ORDER — FENTANYL CITRATE (PF) 100 MCG/2ML IJ SOLN
25.0000 ug | INTRAMUSCULAR | Status: DC | PRN
  Administered 2024-07-11: 25 ug via INTRAVENOUS
  Administered 2024-07-11: 50 ug via INTRAVENOUS

## 2024-07-11 MED ORDER — ORAL CARE MOUTH RINSE: 15.0000 mL | Freq: Once | OROMUCOSAL | Status: AC

## 2024-07-11 MED ORDER — INSULIN ASPART 100 UNIT/ML IJ SOLN: 0.0000 [IU] | INTRAMUSCULAR | Status: DC | PRN

## 2024-07-11 MED ORDER — PROPOFOL 10 MG/ML IV BOLUS
INTRAVENOUS | Status: AC
  Filled 2024-07-11: qty 20

## 2024-07-11 MED ORDER — SODIUM CHLORIDE 0.9 % IV SOLN
3.0000 g | INTRAVENOUS | Status: AC
  Administered 2024-07-11: 3 g via INTRAVENOUS
  Filled 2024-07-11: qty 8

## 2024-07-11 MED ORDER — CHLORHEXIDINE GLUCONATE 0.12 % MT SOLN
15.0000 mL | Freq: Once | OROMUCOSAL | Status: AC
  Administered 2024-07-11: 15 mL via OROMUCOSAL
  Filled 2024-07-11: qty 15

## 2024-07-11 SURGICAL SUPPLY — 56 items
BLADE SURG 15 STRL LF DISP TIS (BLADE) ×3 IMPLANT
CORD BIPOLAR FORCEPS 12FT (ELECTRODE) ×1 IMPLANT
COVER PROBE W GEL 5X96 (DRAPES) ×1 IMPLANT
COVER SURGICAL LIGHT HANDLE (MISCELLANEOUS) ×1 IMPLANT
DERMABOND ADVANCED .7 DNX12 (GAUZE/BANDAGES/DRESSINGS) ×2 IMPLANT
DRAPE C-ARM 35X43 STRL (DRAPES) ×1 IMPLANT
DRAPE INCISE IOBAN 66X45 STRL (DRAPES) ×1 IMPLANT
DRAPE MICROSCOPE LEICA 54X105 (DRAPES) ×1 IMPLANT
DRSG TEGADERM 4X4.75 (GAUZE/BANDAGES/DRESSINGS) ×3 IMPLANT
ELECT COATED BLADE 2.86 ST (ELECTRODE) ×1 IMPLANT
ELECTRODE 4 CHANNEL SET (MISCELLANEOUS) IMPLANT
ELECTRODE EMG 18 NIMS (NEUROSURGERY SUPPLIES) ×1 IMPLANT
ELECTRODE REM PT RTRN 9FT ADLT (ELECTROSURGICAL) ×1 IMPLANT
GAUZE 4X4 16PLY ~~LOC~~+RFID DBL (SPONGE) ×1 IMPLANT
GAUZE SPONGE 4X4 12PLY STRL (GAUZE/BANDAGES/DRESSINGS) ×1 IMPLANT
GENERATOR PULSE INSPIRE (Generator) ×1 IMPLANT
GENERATOR PULSE INSPIRE IV (Generator) ×1 IMPLANT
GLOVE BIO SURGEON STRL SZ 6 (GLOVE) ×1 IMPLANT
GOWN STRL REUS W/ TWL LRG LVL3 (GOWN DISPOSABLE) ×3 IMPLANT
KIT BASIN OR (CUSTOM PROCEDURE TRAY) ×1 IMPLANT
KIT NEURO ACCESSORY W/WRENCH (MISCELLANEOUS) IMPLANT
KIT TURNOVER KIT B (KITS) ×1 IMPLANT
LEAD SENSING RESP INSPIRE (Lead) ×1 IMPLANT
LEAD SENSING RESP INSPIRE IV (Lead) ×1 IMPLANT
LEAD SLEEP STIM INSPIRE IV/V (Lead) ×1 IMPLANT
LEAD SLEEP STIMULATION INSPIRE (Lead) ×1 IMPLANT
LOOP VASCLR MAXI BLUE 18IN ST (MISCELLANEOUS) ×1 IMPLANT
MANIFOLD NEPTUNE II (INSTRUMENTS) IMPLANT
MARKER SKIN DUAL TIP RULER LAB (MISCELLANEOUS) ×2 IMPLANT
NDL HYPO 25GX1X1/2 BEV (NEEDLE) ×1 IMPLANT
NEEDLE HYPO 25GX1X1/2 BEV (NEEDLE) ×1 IMPLANT
PAD ARMBOARD POSITIONER FOAM (MISCELLANEOUS) ×1 IMPLANT
PASSER CATH 38CM DISP (INSTRUMENTS) ×1 IMPLANT
PENCIL BUTTON HOLSTER BLD 10FT (ELECTRODE) ×1 IMPLANT
POSITIONER HEAD DONUT 9IN (MISCELLANEOUS) ×1 IMPLANT
PROBE NERVE STIMULATOR (NEUROSURGERY SUPPLIES) ×1 IMPLANT
REMOTE CONTROL SLEEP INSPIRE (MISCELLANEOUS) ×1 IMPLANT
RETRACTOR STAY HOOK 5MM (MISCELLANEOUS) ×1 IMPLANT
SET WALTER ACTIVATION W/DRAPE (SET/KITS/TRAYS/PACK) IMPLANT
SHEARS HARMONIC 9CM CVD (BLADE) ×1 IMPLANT
SOLN 0.9% NACL 1000 ML (IV SOLUTION) ×1 IMPLANT
SOLN 0.9% NACL POUR BTL 1000ML (IV SOLUTION) ×1 IMPLANT
SPONGE INTESTINAL PEANUT (DISPOSABLE) ×3 IMPLANT
SUT MNCRL AB 4-0 PS2 18 (SUTURE) ×2 IMPLANT
SUT SILK 2 0 SH CR/8 (SUTURE) IMPLANT
SUT SILK 2 0 TIES 10X30 (SUTURE) IMPLANT
SUT SILK 3 0 TIES 10X30 (SUTURE) IMPLANT
SUT SILK 3 0SH CR/8 30 (SUTURE) IMPLANT
SUT VIC AB 3-0 SH 27X BRD (SUTURE) ×2 IMPLANT
SUTURE SILK 3-0 RB1 30XBRD (SUTURE) ×2 IMPLANT
SUTURE SILK PEM 2-0 1X30 RB-1 (SUTURE) ×2 IMPLANT
SYR 10ML LL (SYRINGE) ×1 IMPLANT
TAPE CLOTH SURG 4X10 WHT LF (GAUZE/BANDAGES/DRESSINGS) ×1 IMPLANT
TOWEL GREEN STERILE (TOWEL DISPOSABLE) ×1 IMPLANT
TRAY ENT MC OR (CUSTOM PROCEDURE TRAY) ×1 IMPLANT
VASCULAR TIE MINI RED 18IN STL (MISCELLANEOUS) ×1 IMPLANT

## 2024-07-11 NOTE — Discharge Instructions (Signed)
INSPIRE POST-OPERATIVE INSTRUCTIONS:   Please review post-operative and recovery instructions below that you will need to be aware of after Inspire Implant surgery.  Please restart all of your home medications if you take anything on a daily basis.  You can resume regular diet after this procedure.  You will be scheduled for an appointment with Dr. Irene Pap to review details about surgery and to discuss the next steps.   DIET: Resume normal diet HYGIENE: Please wait until 48 hours after surgery before getting incisions on neck, chest, and torso wet. In the first 48 hours after surgery, will likely need to take sponge baths. WOUND CARE: Please leave pressure dressing on for 48 hours after surgery. Gently place antibiotic ointment over incisions 2 times per day; use clean q-tip. May place a clean bandage over incisions as needed. After 48 hours, you may get incisions wet with warm soap and water, but do not soak the incisions.  Pat area dry gently.  Immediately place antibiotic ointment. Take oral antibiotics as prescribed If skin around incision starts to get red (> 1cm), swollen, and/or more painful, please call the office ACTIVITY: Try to avoid sleeping on the side of your surgery, to the extent possible.   You may walk for exercise starting the day after surgery. For 2 weeks: Do not pick up anything greater than 5 pounds with the hand/arm that's on the same side as the surgery.  After 2 weeks, you may increase weight to 10 pounds.   Consider performing neck rolls 10 clockwise and 10 counterclockwise 3x/day. For 4 weeks, no strenuous activity (running, jogging, lifting weights, gardening, sports) or until cleared by physician.   PAIN MEDICATIONS: You will be prescribed Oxycodone for pain.   If pain is not severe, consider taking Tylenol 650mg  every 6 hours Avoid aspirin for 7 days after surgery Avoid direct heat (such as heating pads) to incision sites.   May gently place ice over  surgery sites as needed.  Please place a thin clean towel over skin first and then place ice bag over towel.  Ice for 10 minutes at a time only.  POST-OPERATIVE CLINIC APPOINTMENTS: 1 week: suture removal and wound check in the office.  1 month: device activation and wound check in the office. 2.5 months: check in visit to assess usage. 3-4 months: titration sleep study based on usage of >4 hrs/night.  4 months: final wound check in the office.  Yearly: device check at office.  SCAR CARE: After incisions have healed, you will have a scar, which will continue to evolve over the course of 12 months.  Caring for your incision scars will help them to be as minimal as possible. If you are out in the sun with incision exposure, please remember to place sunscreen over the incision and surrounding skin.   You may use vitamin E or "Scar ointment/cream" to help soften scar.  Please wait one month after surgery before starting this.

## 2024-07-11 NOTE — Interval H&P Note (Signed)
 History and Physical Interval Note:  07/11/2024 8:23 AM  Erica [name]  has presented today for surgery, with the diagnosis of Obstructive sleep apnea.  The various methods of treatment have been discussed with the patient and family. After consideration of risks, benefits and other options for treatment, the patient has consented to  Procedure(s): INSERTION, HYPOGLOSSAL NERVE STIMULATOR (N/A) as a surgical intervention.  The patient's history has been reviewed, patient examined, no change in status, stable for surgery.  I have reviewed the patient's chart and labs.  Questions were answered to the patient's satisfaction.     [name]

## 2024-07-11 NOTE — Op Note (Signed)
 Operative Report                                                            SURGEON: Elena Larry, MD  ASSISTANT: Powell Seats RNFA  PREOPERATIVE DIAGNOSIS: Obstructive sleep apnea. BMI of 32  POSTOPERATIVE DIAGNOSIS: Obstructive sleep apnea. BMI of 32  ANESTHESIA: General endotracheal.  ESTIMATED BLOOD LOSS: Less than 15 ml.  SPECIMENS: None.  DRAINS: None.  COMPLICATIONS: None.  Procedure: 12th cranial nerve (hypoglossal) stimulation implant along with placement of right pleural respiration sensor.  Electronic analysis of the implanted neurostimulator pulse generator system  Pre-Op Diagnosis: Moderate /Severe obstructive sleep apnea with positive airway pressure intolerance (ICD-10 G47.33). Post-Op Diagnosis: Moderate /Severe obstructive sleep apnea with positive pressure airway intolerance (ICD-10 G47.33).   Anesthesia: General endotracheal  Complications: None Brief Clinical History:     This is a 68 year old patient with a history of moderate /severe obstructive sleep apnea, who is intolerant and unable to achieve benefit from positive pressure therapy. Patient has passed the clinical, polysomnographic, and endoscopic screening criteria and presents today for the implant.    Procedure Description: The patient was brought to the Operating Room and was anesthetized via general endotracheal anesthesia without complication.  A shoulder roll was placed and the patient was prepped and draped in usual sterile fashion with the head turned to the left. Prior to prepping and draping, electrodes were placed in the genioglossus and styloglossus muscle and connected to the NIM box for intraoperative nerve monitoring.  A submental incision was made in the right upper neck approximately 2 cm below the mandible in the natural skin crease. Dissection was carried down through the subcutaneous tissue and platysma. The inferior border of the submandibular gland was identified as well  as the digastric tendon. The submandibular gland and the overlying fascia with the marginal mandibular nerve were retracted superiorly.  The digastric was retracted inferiorly.  Dissection was carried down into the digastric triangle where the hypoglossal nerve was identified in its usual fashion.   The mylohyoid muscle was retracted anteriorally, and the hypoglossal nerve was dissected up towards the floor of the mouth.  The lateral branches to retrusor muscles were identified, and tested intra-operatively using the NIM stimulator.  The cuff electrode for the hypoglossal nerve stimulator was placed distally to these branches on the medial nerve branch to the genioglossus muscle. Diagnostic evaluation confirmed activation of the genioglossus nerve, resulting in genioglossal activation and tongue protrusion, confirmed both visually and on NIM monitor. The stimulation electrode was then secured to the digastric tendon on its lateral surface with the provided anchor.  A second 5 cm incision was made in the right upper chest approximately 3 cm below the clavicle.  Dissection was carried down to the pectoralis muscle. An inferior pocket was created deep to the subcutaneous layer and superficial to the pectoralis major muscle.  In the upper portion of our chest pocket, pectoralis muscle fibers were then divided until we encountered anterior chest wall. External intercostal muscle was visualized and dissected until internal intercostal was visualized. Our respiratory sense lead was then tunneled in the fascial plane between external and internal intercostals. The distal anchor was secured to the anterior chest wall using 3-2.0 silk sutures and the proximal anchor was secured to the pec major facscia.  The stimulation lead was then tunneled in a subplatysmal plane and brought out into the sub-clavicular pocket.  Pulse generator was then brought onto the field and the sense and stimulation pins were both connected to the  appropriate headers and secured using a torque screwdriver. The implantable pulse generator was placed in the subclavicular pocket and secured loosely to the pectoralis fascia using non-resorbable sutures.    Diagnostic evaluation of the system was run, confirming good respiratory wave from the sensing lead, good anterior tongue protrusion with stimulation, and normal impedence measurements.  All the wounds were thoroughly irrigated with bacitracin irrigation.  The wounds were then closed in multiple layers with deep Vicryl sutures and a 5-0 biosyn.  Dermabond was then applied to the skin.    The patient was then awakened, extubated, and transferred to Recovery Room in stable condition. All counts correct x2.  I was present for and performed the entire procedure.

## 2024-07-11 NOTE — Anesthesia Procedure Notes (Signed)
 Procedure Name: Intubation Date/Time: 07/11/2024 9:44 AM  Performed by: Julien Manus, CRNAPre-anesthesia Checklist: Patient identified, Emergency Drugs available, Suction available and Patient being monitored Patient Re-evaluated:Patient Re-evaluated prior to induction Oxygen Delivery Method: Circle System Utilized Preoxygenation: Pre-oxygenation with 100% oxygen Induction Type: IV induction Ventilation: Mask ventilation without difficulty Laryngoscope Size: Mac and 3 Grade View: Grade II Tube type: Oral Tube size: 7.0 mm Number of attempts: 1 Airway Equipment and Method: Stylet and Oral airway Placement Confirmation: ETT inserted through vocal cords under direct vision, positive ETCO2 and breath sounds checked- equal and bilateral Secured at: 22 cm Tube secured with: Tape Dental Injury: Teeth and Oropharynx as per pre-operative assessment

## 2024-07-11 NOTE — Transfer of Care (Signed)
 Immediate Anesthesia Transfer of Care Note  Patient: Erica [name]  Procedure(s) Performed: INSERTION, HYPOGLOSSAL NERVE STIMULATOR (Neck)  Patient Location: PACU  Anesthesia Type:General  Level of Consciousness: drowsy and patient cooperative  Airway & Oxygen Therapy: Patient Spontanous Breathing and Patient connected to face mask oxygen  Post-op Assessment: Report given to RN and Post -op Vital signs reviewed and stable  Post vital signs: Reviewed and stable  Last Vitals:  Vitals Value Taken Time  BP 121/85   Temp 97.5   Pulse 80 07/11/24 11:50  Resp 14   SpO2 100 % 07/11/24 11:50  Vitals shown include unfiled device data.  Last Pain:  Vitals:   07/11/24 0800  TempSrc:   PainSc: 0-No pain      Patients Stated Pain Goal: 0 (07/11/24 0800)  Complications: No notable events documented.

## 2024-07-12 ENCOUNTER — Encounter (HOSPITAL_COMMUNITY): Payer: Self-pay | Admitting: Otolaryngology

## 2024-07-12 ENCOUNTER — Ambulatory Visit: Admitting: Dermatology

## 2024-07-23 NOTE — Anesthesia Postprocedure Evaluation (Signed)
 Anesthesia Post Note  Patient: Erica [name]  Procedure(s) Performed: INSERTION, HYPOGLOSSAL NERVE STIMULATOR (Neck)     Patient location during evaluation: PACU Anesthesia Type: General Level of consciousness: awake and alert Pain management: pain level controlled Vital Signs Assessment: post-procedure vital signs reviewed and stable Respiratory status: spontaneous breathing, nonlabored ventilation, respiratory function stable and patient connected to nasal cannula oxygen Cardiovascular status: blood pressure returned to baseline and stable Postop Assessment: no apparent nausea or vomiting Anesthetic complications: no   No notable events documented.  Last Vitals:  Vitals:   07/11/24 1245 07/11/24 1300  BP: 132/82 124/82  Pulse: 75 73  Resp: 16 13  Temp:  36.9 C  SpO2: 97% 97%    Last Pain:  Vitals:   07/11/24 1300  TempSrc:   PainSc: 6                  Epifanio Lamar BRAVO

## 2024-07-25 ENCOUNTER — Ambulatory Visit (INDEPENDENT_AMBULATORY_CARE_PROVIDER_SITE_OTHER): Admitting: Otolaryngology

## 2024-07-25 ENCOUNTER — Encounter (INDEPENDENT_AMBULATORY_CARE_PROVIDER_SITE_OTHER): Payer: Self-pay | Admitting: Otolaryngology

## 2024-07-25 VITALS — BP 119/78 | HR 73 | Temp 98.1°F

## 2024-07-25 DIAGNOSIS — Z789 Other specified health status: Secondary | ICD-10-CM

## 2024-07-25 DIAGNOSIS — Z9889 Other specified postprocedural states: Secondary | ICD-10-CM

## 2024-07-25 DIAGNOSIS — G4733 Obstructive sleep apnea (adult) (pediatric): Secondary | ICD-10-CM

## 2024-07-25 NOTE — Progress Notes (Signed)
 ENT Progess Note   She is here for post-op check s/p Inspire. No issues with pain, no issues with tongue mobility. No swelling or erythema around surgical incisions.   Physical Exam: Right upper chest and neck incisions clean and dry, well-healed CN XII intact  A/P S/p Inspire, healing as expected Ok to proceed with activation as scheduled

## 2024-08-01 ENCOUNTER — Telehealth: Payer: Self-pay | Admitting: Family Medicine

## 2024-08-01 NOTE — Telephone Encounter (Signed)
 Patient called and stated that a surgeon was supposed to be getting in touch with her and she has not heard from them and now her lower back is hurting as well. She does not know if it is time for another shot maybe. Please advise.

## 2024-08-02 NOTE — Telephone Encounter (Signed)
 Added to wait list.

## 2024-08-04 ENCOUNTER — Other Ambulatory Visit: Payer: Self-pay | Admitting: Family Medicine

## 2024-08-06 ENCOUNTER — Telehealth: Payer: Self-pay

## 2024-08-06 DIAGNOSIS — Z1211 Encounter for screening for malignant neoplasm of colon: Secondary | ICD-10-CM

## 2024-08-06 NOTE — Telephone Encounter (Signed)
 Copied from CRM [id number]. Topic: Clinical - Medical Advice >> Aug 06, 2024 11:33 AM Erica [name] wrote: Reason for CRM: Patient is calling because Dr.Burns said if she hasn't heard anything about her colonoscopy to call, so she's calling to get an updated. Callback number [phone]

## 2024-08-06 NOTE — Addendum Note (Signed)
 Addended by: CLAUDENE TOBIAS PARAS on: 08/06/2024 04:26 PM   Modules accepted: Orders

## 2024-08-08 ENCOUNTER — Other Ambulatory Visit: Payer: Self-pay | Admitting: Internal Medicine

## 2024-08-14 NOTE — Progress Notes (Unsigned)
 [name] [address] [place] [address] Phone: [phone] Subjective:   Erica [name] am a scribe for Dr. Claudene.   I'm seeing this patient by the request  of:  Geofm Glade PARAS, MD  CC: Left hip and lower back pain, right knee pain is the worst  YEP:Dlagzrupcz  05/16/2024 Continue supplementation     Will continue to monitor for any signs and symptoms.     Chronic problem with worsening symptoms.  Differential includes still with the lumbar radiculopathy.  Patient is continuing to stay active but unfortunately continues to compensate for her knee.  Continues to have difficulty with daily activities and sometimes sleeping.  Still wants to hold on any other further imaging.  Will follow-up again in 3 months   Update 08/16/2024 Erica [name] is a 68 y.o. female coming in with complaint of L hip and lower back pain. Patient states needs the Vitamin D  [facility]. Hips and low about are ok. Today it is the right knee. Thinks there is fluid in it because it is bigger than the left knee. Feels like a knife stabs her in the right knee and it wakes her up at night.       Past Medical History:  Diagnosis Date   Abnormal stress test    a. 09/2016: NST showed a small defect of mild severity present in the basal inferoseptal and mid inferoseptal location, consistent with ischemia. --> medically managed   ALLERGIC RHINITIS    Anxiety    Bursitis    DDD (degenerative disc disease), lumbar    ESI with Ramos (spring 2016)   Depression    Diabetes mellitus without complication (HCC)    Type II   Dyslipidemia    External hemorrhoids    GERD (gastroesophageal reflux disease)    Hypertension    Obesity    Osteoarthritis    Palpitations    a. prior event monitor showing sinus tachycardia, no PAF.    Panic attacks    Hx of depression   Sleep apnea    No Cpap   Past Surgical History:  Procedure Laterality Date   ABDOMINAL HYSTERECTOMY      DILATION AND CURETTAGE OF UTERUS     DRUG INDUCED ENDOSCOPY N/A 11/29/2023   Procedure: DRUG INDUCED ENDOSCOPY;  Surgeon: Jude Harden GAILS, MD;  Location: [facility] ENDOSCOPY;  Service: Pulmonary;  Laterality: N/A;   IMPLANTATION OF HYPOGLOSSAL NERVE STIMULATOR N/A 07/11/2024   Procedure: INSERTION, HYPOGLOSSAL NERVE STIMULATOR;  Surgeon: Okey Burns, MD;  Location: [facility] OR;  Service: ENT;  Laterality: N/A;   MASS EXCISION Left 12/30/2015   Procedure: EXCISION LEFT LEG MASS;  Surgeon: Donnice Lima, MD;  Location: [facility];  Service: General;  Laterality: Left;   NASAL TURBINATE REDUCTION Bilateral 03/04/2022   Procedure: TURBINATE REDUCTION;  Surgeon: Carlie Clark, MD;  Location: [facility] OR;  Service: ENT;  Laterality: Bilateral;   REPLACEMENT TOTAL KNEE Right    RIGHT OOPHORECTOMY     No cancer   Social History   Socioeconomic History   Marital status: Married    Spouse name: Amelie   Number of children: 3   Years of education: Not on file   Highest education level: Not on file  Occupational History   Occupation: Disabled  Tobacco Use   Smoking status: Never   Smokeless tobacco: Never   Tobacco comments:    tried to as a teenager  Advertising Account Planner  Vaping status: Never Used  Substance and Sexual Activity   Alcohol use: Yes    Alcohol/week: 1.0 standard drink of alcohol    Types: 1 Glasses of wine per week    Comment: one drink a month   Drug use: No   Sexual activity: Not Currently  Other Topics Concern   Not on file  Social History Narrative   Married with children. Pt is on disablity. Previous worked in the school system   Social Drivers of Corporate Investment Banker Strain: Low Risk  (08/30/2023)   Overall Financial Resource Strain (CARDIA)    Difficulty of Paying Living Expenses: Not hard at all  Food Insecurity: No Food Insecurity (08/30/2023)   Hunger Vital Sign    Worried About Running Out of Food in the Last Year: Never true    Ran Out of Food in the Last  Year: Never true  Transportation Needs: No Transportation Needs (08/30/2023)   PRAPARE - Administrator, Civil Service (Medical): No    Lack of Transportation (Non-Medical): No  Physical Activity: Inactive (08/30/2023)   Exercise Vital Sign    Days of Exercise per Week: 0 days    Minutes of Exercise per Session: 0 min  Stress: Stress Concern Present (08/30/2023)   [employer] of Occupational Health - Occupational Stress Questionnaire    Feeling of Stress : To some extent  Social Connections: Moderately Integrated (08/30/2023)   Social Connection and Isolation Panel    Frequency of Communication with Friends and Family: More than three times a week    Frequency of Social Gatherings with Friends and Family: Twice a week    Attends Religious Services: More than 4 times per year    Active Member of [employer] or Organizations: No    Attends Banker Meetings: Never    Marital Status: Married   Allergies  Allergen Reactions   Seroquel [Quetiapine Fumarate] Other (See Comments)    Hallucinations, palpitations   Codeine Nausea And Vomiting   Guaifenesin -Codeine Other (See Comments)     feels spacey   Wellbutrin [Bupropion] Palpitations    hallucinations   Family History  Problem Relation Age of Onset   Heart disease Mother        Enlarged Heart, Pacemaker   Diabetes Mother    Thyroid  disease Mother    Hyperlipidemia Mother    Hypertension Mother    Sleep apnea Mother    Dementia Father    Kidney failure Father    Prostate cancer Father    Alcoholism Father    Asthma Other    Allergies Sister    Asthma Son    Breast cancer Maternal Aunt        cousin   Colon cancer Cousin    Heart disease Son        CHF, morbid obesity   Prostate cancer Maternal Uncle    Diabetes Son     Current Outpatient Medications (Endocrine & Metabolic):    predniSONE  (DELTASONE ) 20 MG tablet, Take 1 tablet (20 mg total) by mouth daily for 5 days.   RYBELSUS  14 MG TABS,  TAKE ONE TABLET BY MOUTH EVERY DAY - (TAKE WITH NO MORE THAN 4 OUNCES OF PLAIN WATER AT LEAST 30 MINUTES BEFORE THE FIRST FOOD, BEVERAGE, OR OTHER MEDICATIONS OF THE DAY)  Current Outpatient Medications (Cardiovascular):    atenolol  (TENORMIN ) 25 MG tablet, Take 1 tablet (25 mg total) by mouth 2 (two) times daily. (Patient taking differently: Take  25-50 mg by mouth See admin instructions. 25 mg in the morning, 50 mg at bedtime)   CRESTOR  20 MG tablet, TAKE ONE TABLET BY MOUTH EVERY DAY (Patient taking differently: Take 20 mg by mouth at bedtime.)   diltiazem  (CARDIZEM ) 30 MG tablet, Take 1 tablet (30 mg total) by mouth at bedtime. (Patient taking differently: Take 30 mg by mouth daily as needed (High Heart Rate).)   furosemide  (LASIX ) 40 MG tablet, TAKE ONE TABLET BY MOUTH EVERY DAY  Current Outpatient Medications (Respiratory):    fluticasone  (FLONASE ) 50 MCG/ACT nasal spray, INSTILL 2 SPRAYS IN EACH NOSTRIL EVERY DAY (Patient taking differently: Place 2 sprays into both nostrils daily as needed for allergies.)   levocetirizine (XYZAL ) 5 MG tablet, TAKE ONE TABLET BY MOUTH EVERY DAY IN THE EVENING  Current Outpatient Medications (Analgesics):    acetaminophen  (TYLENOL ) 500 MG tablet, Take 1 tablet (500 mg total) by mouth every 6 (six) hours. Take every 6 hrs x 2-3 days then take every 6 hrs as needed   ibuprofen  (ADVIL ) 600 MG tablet, Take 1 tablet (600 mg total) by mouth every 6 (six) hours. Please take every 6 hrs, and stagger this medication 3 hrs apart from Tylenol    oxyCODONE  (ROXICODONE ) 5 MG immediate release tablet, Take 1 tablet (5 mg total) by mouth every 6 (six) hours as needed for severe pain (pain score 7-10).   SUMAtriptan -naproxen  (TREXIMET ) 85-500 MG tablet, Take 1 pill as needed for migraine, if migraine persists may repeat x1 after 2 hours   Current Outpatient Medications (Other):    amoxicillin -clavulanate (AUGMENTIN ) 875-125 MG tablet, Take 1 tablet by mouth 2 (two) times  daily.   clonazePAM (KLONOPIN) 0.5 MG tablet, Take 0.5 mg by mouth 2 (two) times daily as needed for anxiety.   cyclobenzaprine  (FLEXERIL ) 5 MG tablet, Take 1 tablet (5 mg total) by mouth at bedtime.   fluconazole  (DIFLUCAN ) 200 MG tablet, Take 1 tablet (200 mg total) by mouth once a week. Take one tablet every Friday   gabapentin  (NEURONTIN ) 100 MG capsule, Take 100 mg by mouth at bedtime as needed (pain).   hydrOXYzine  (ATARAX ) 25 MG tablet, Take 25 mg by mouth 3 (three) times daily as needed for itching (allergies).   LINZESS  290 MCG CAPS capsule, TAKE ONE CAPSULE BY MOUTH EVERY DAY BEFORE BREAKFAST (Patient taking differently: Take 290 mcg by mouth daily as needed (constipation).)   mupirocin  ointment (BACTROBAN ) 2 %, Apply 1 Application topically 2 (two) times daily.   omeprazole  (PRILOSEC) 20 MG capsule, Take 1 capsule (20 mg total) by mouth 2 (two) times daily as needed (take 30 minutes prior to a meal). (Patient taking differently: Take 20 mg by mouth at bedtime.)   ondansetron  (ZOFRAN ) 4 MG tablet, Take 1 tablet (4 mg total) by mouth every 8 (eight) hours as needed for nausea or vomiting.   potassium chloride  SA (KLOR-CON  M) 20 MEQ tablet, TAKE ONE TABLET BY MOUTH EVERY DAY   TURMERIC PO, Take 1 capsule by mouth daily.   valACYclovir  (VALTREX ) 1000 MG tablet, Take 2000 mg Q 12 hr x 1 day for outbreak   venlafaxine  XR (EFFEXOR -XR) 150 MG 24 hr capsule, Take 1 capsule (150 mg total) by mouth 2 (two) times daily. (Patient taking differently: Take 150 mg by mouth daily with breakfast.)   Vitamin D , Ergocalciferol , (DRISDOL ) 1.25 MG (50000 UNIT) CAPS capsule, Take 1 capsule (50,000 Units total) by mouth every 7 (seven) days.   Reviewed prior external information including notes and  imaging from  primary care provider As well as notes that were available from care everywhere and other healthcare systems.  Past medical history, social, surgical and family history all reviewed in electronic  medical record.  No pertanent information unless stated regarding to the chief complaint.   Review of Systems:  No headache, visual changes, nausea, vomiting, diarrhea, constipation, dizziness, abdominal pain, skin rash, fevers, chills, night sweats, weight loss, swollen lymph nodes, body aches, joint swelling, chest pain, shortness of breath, mood changes. POSITIVE muscle aches  Objective  Blood pressure 124/70, pulse 75, height 5' 7 (1.702 m), SpO2 96%.   General: No apparent distress alert and oriented x3 mood and affect normal, dressed appropriately.  HEENT: Pupils equal, extraocular movements intact  Respiratory: Patient's speak in full sentences and does not appear short of breath  Cardiovascular: No lower extremity edema, non tender, no erythema  Severely antalgic gait noted. Continues to have difficulty favoring the right leg.  Patient does have an audible popping sensation with movement of the right knee.  Tender to palpation but no warmness to touch.  Seems to have some difficulty with varus stress on the joint replacement.    Impression and Recommendations:     The above documentation has been reviewed and is accurate and complete Dorlisa Savino M Mailin Coglianese, DO

## 2024-08-16 ENCOUNTER — Ambulatory Visit: Admitting: Family Medicine

## 2024-08-16 ENCOUNTER — Other Ambulatory Visit: Payer: Self-pay

## 2024-08-16 VITALS — BP 124/70 | HR 75 | Ht 67.0 in

## 2024-08-16 DIAGNOSIS — Z96651 Presence of right artificial knee joint: Secondary | ICD-10-CM

## 2024-08-16 DIAGNOSIS — G8929 Other chronic pain: Secondary | ICD-10-CM | POA: Diagnosis not present

## 2024-08-16 DIAGNOSIS — M25561 Pain in right knee: Secondary | ICD-10-CM | POA: Diagnosis not present

## 2024-08-16 MED ORDER — PREDNISONE 20 MG PO TABS: 20.0000 mg | ORAL_TABLET | Freq: Every day | ORAL | 0 refills | Status: AC

## 2024-08-16 MED ORDER — VITAMIN D (ERGOCALCIFEROL) 1.25 MG (50000 UNIT) PO CAPS: 50000.0000 [IU] | ORAL_CAPSULE | ORAL | 0 refills | Status: AC

## 2024-08-16 MED ORDER — METHYLPREDNISOLONE ACETATE 40 MG/ML IJ SUSP
40.0000 mg | Freq: Once | INTRAMUSCULAR | Status: AC
  Administered 2024-08-16: 40 mg via INTRAMUSCULAR

## 2024-08-16 MED ORDER — KETOROLAC TROMETHAMINE 30 MG/ML IJ SOLN
30.0000 mg | Freq: Once | INTRAMUSCULAR | Status: AC
  Administered 2024-08-16: 30 mg via INTRAMUSCULAR

## 2024-08-16 NOTE — Patient Instructions (Addendum)
 Good to see you. Half cocktail injections in backside today. Use Voltaren  gel on areas of pain. Ice. Prednisone  20 for 5 days starting tomorrow. 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 [facility] See me again in 3 months.

## 2024-08-16 NOTE — Assessment & Plan Note (Signed)
 Patient has bone scan showing chronic synovitis.  Patient is having more more swelling and instability noted.  Knee replacement is 70 years old.  I am concerned that we do have a failure at the moment.  Toradol  and Depo-Medrol  injection was given.  Short course of prednisone  given.  Referred to orthopedic surgery to discuss the potential for revision.  Patient would be willing to do this with the patient trying to stay active.  At the moment unable to be active secondary to the instability and pain.  Affecting daily activities and sometimes even nighttime pain.

## 2024-08-23 ENCOUNTER — Encounter (HOSPITAL_BASED_OUTPATIENT_CLINIC_OR_DEPARTMENT_OTHER): Payer: Self-pay | Admitting: Pulmonary Disease

## 2024-08-23 ENCOUNTER — Ambulatory Visit (INDEPENDENT_AMBULATORY_CARE_PROVIDER_SITE_OTHER): Admitting: Pulmonary Disease

## 2024-08-23 VITALS — BP 122/83 | HR 67 | Ht 67.0 in | Wt 209.0 lb

## 2024-08-23 DIAGNOSIS — Z9682 Presence of neurostimulator: Secondary | ICD-10-CM | POA: Diagnosis not present

## 2024-08-23 DIAGNOSIS — G4733 Obstructive sleep apnea (adult) (pediatric): Secondary | ICD-10-CM | POA: Diagnosis not present

## 2024-08-23 DIAGNOSIS — Z4542 Encounter for adjustment and management of neuropacemaker (brain) (peripheral nerve) (spinal cord): Secondary | ICD-10-CM | POA: Diagnosis not present

## 2024-08-23 NOTE — Patient Instructions (Signed)
 Your device was activated today Start delay 94m Pause time 15 m Duration 7h  Level 1 = 0.5 V Increase by 1 level every week until tongue discomfort  Try to use every night

## 2024-08-23 NOTE — Progress Notes (Signed)
 Subjective:    Patient ID: Erica [name], female    DOB: [birth year], 68 y.o.   MRN: [id number]   68 yo never smoker for follow-up of OSA  PMH -osteoarthritis, diabetes, panic attacks turbinate reduction surgery by Dr. Carlie 02/2022   OSA diagnosed in 2011 failed CPAP &  oral appliance  Underwent inspire implantation 07/11/24 Presents for activation     Discussed the use of AI scribe software for clinical note transcription with the patient, who gave verbal consent to proceed.  History of Present Illness Erica [name] is a 68 year old female with obstructive sleep apnea who presents for activation of a hypoglossal nerve stimulator. She is accompanied by [place], a representative from the company that provided the implant.  She underwent hypoglossal nerve stimulator implantation for obstructive sleep apnea and is here for device activation. She experiences occasional soreness at the implant site. During activation, she feels a heavy sensation at the front of her tongue, tongue drooping, and stiffness, but no significant pain. Her sleep pattern is irregular, with difficulty falling asleep, often going to bed around 12:30 AM and waking up around 7:00 AM. She sometimes experiences insomnia, especially when on medications like prednisone . She wakes up during the night to use the bathroom but typically returns to sleep within 15 minutes. She experiences dry mouth during activation and believes she can sleep with the device operating at the current low level of stimulation. She denies pain at the implant site during activation.    Significant tests/ events reviewed   05/2022 HST >> severe OSA, AHI 34/h, low saturation of 80%.   05/2019 CPAP titration >> 13 cm 11/2009  mild , AHI of 10/hr and desat as low as 83% - wt 240 lbs - BMI 39  PSG 09/2013 showed mild OSA- AHI 13/h    HST 01/2016 20/hr.     HST 03/2017 >> (with oral appliance ) >> mild OSa persists   CT head showed  meningioma & has cluster headaches   Review of Systems  neg for any significant sore throat, dysphagia, itching, sneezing, nasal congestion or excess/ purulent secretions, fever, chills, sweats, unintended wt loss, pleuritic or exertional cp, hempoptysis, orthopnea pnd or change in chronic leg swelling. Also denies presyncope, palpitations, heartburn, abdominal pain, nausea, vomiting, diarrhea or change in bowel or urinary habits, dysuria,hematuria, rash, arthralgias, visual complaints, headache, numbness weakness or ataxia.      Objective:   Physical Exam  Gen. Pleasant, obese, in no distress ENT - no lesions, no post nasal drip Neck: No JVD, no thyromegaly, no carotid bruits Lungs: no use of accessory muscles, no dullness to percussion, decreased without rales or rhonchi  Cardiovascular: Rhythm regular, heart sounds  normal, no murmurs or gallops, no peripheral edema Musculoskeletal: No deformities, no cyanosis or clubbing , no tremors        Assessment & Plan:   Assessment and Plan Assessment & Plan Obstructive sleep apnea status post hypoglossal nerve stimulator implantation Status post hypoglossal nerve stimulator implantation for obstructive sleep apnea. Device activation performed today. Initial activation at a low level to ensure comfort and gradual adjustment. No significant pain or discomfort reported. Device set to stimulate the hypoglossal nerve to move the tongue forward during sleep, aiming to open the airway. Initial settings include a start delay of 40 minutes, a pause of 15 minutes, and a duration of 7 hours. Plan to gradually increase stimulation level over the next three months to find the optimal range.  A sleep study is planned at three months to determine the most effective level of stimulation. - Activated hypoglossal nerve stimulator at low level. - Set device to start delay of 40 minutes, pause of 15 minutes, and duration of 7 hours. - Gradually increase  stimulation level weekly, starting at level 1, up to level 11 as tolerated. - Scheduled follow-up visits monthly for the next three months to adjust settings. - Plan sleep study at three months to determine optimal stimulation level. - Educated on device operation, including play, pause, stop, and level adjustment buttons. - Advised on battery management and replacement as needed.    Hypoglossal nerve stimulator was activated today.  Incision was checked, tongue protrusion was examined Programming : The activation workflow was followed 1.  Stimulation level : Sensation 0.3 V , functional level 0.5 V lower limit, upper limit 1.5 V 2.  Start delay 40 minutes, pause time 15 minutes, duration 7 hours 3.  Sensing waveform was analyzed for 3 minutes 4.  Sleep remote education was provided patient demonstrated competency with the remote and was aware of patient Instruction videos and sleep remote guide 5.  Patient was instructed to step up levels by 1 level (0.1 V ) every week 6.  Check-in visit in 1 month after activation visit to ensure that they are stepping up levels, using therapy  all night, every night and to evaluate subjective benefit.   7.  Inspire titration sleep study will be scheduled 3 months after the activation visit

## 2024-08-27 ENCOUNTER — Other Ambulatory Visit: Payer: Self-pay | Admitting: Internal Medicine

## 2024-08-27 DIAGNOSIS — E1169 Type 2 diabetes mellitus with other specified complication: Secondary | ICD-10-CM

## 2024-08-27 NOTE — Telephone Encounter (Unsigned)
 Copied from CRM #[id number]. Topic: Clinical - Medication Refill >> Aug 27, 2024 11:10 AM Corin V wrote: Medication: RYBELSUS  14 MG TABS LINZESS  290 MCG CAPS capsule She contacted the mail order pharmacy last week and they told her they were sending refill request. No request found in system. She is wondering if there are samples she can pick up in office tomorrow when she comes for her acute visit as she only has 3 days lefts of meds  Has the patient contacted their pharmacy? Yes (Agent: If no, request that the patient contact the pharmacy for the refill. If patient does not wish to contact the pharmacy document the reason why and proceed with request.) (Agent: If yes, when and what did the pharmacy advise?)  This is the patient's preferred pharmacy:  [facility] - [place], [place] - [address] [facility] [address] [place] [address] [place] [address] Phone: [phone] Fax: [phone]  Is this the correct pharmacy for this prescription? Yes If no, delete pharmacy and type the correct one.   Has the prescription been filled recently? No  Is the patient out of the medication? No- down to 3 days  Has the patient been seen for an appointment in the last year OR does the patient have an upcoming appointment? Yes  Can we respond through MyChart? No  Agent: Please be advised that Rx refills may take up to 3 business days. We ask that you follow-up with your pharmacy.

## 2024-08-27 NOTE — Progress Notes (Unsigned)
 Subjective:    Patient ID: Erica [name], female    DOB: 10/09/1956, 68 y.o.   MRN: [id number]      HPI Erica [name] is here for No chief complaint on file.        Medications and allergies reviewed with patient and updated if appropriate.  Current Outpatient Medications on File Prior to Visit  Medication Sig Dispense Refill   acetaminophen  (TYLENOL ) 500 MG tablet Take 1 tablet (500 mg total) by mouth every 6 (six) hours. Take every 6 hrs x 2-3 days then take every 6 hrs as needed 30 tablet 0   amoxicillin -clavulanate (AUGMENTIN ) 875-125 MG tablet Take 1 tablet by mouth 2 (two) times daily. 20 tablet 0   atenolol  (TENORMIN ) 25 MG tablet Take 1 tablet (25 mg total) by mouth 2 (two) times daily. (Patient taking differently: Take 25-50 mg by mouth See admin instructions. 25 mg in the morning, 50 mg at bedtime) 180 tablet 3   clonazePAM (KLONOPIN) 0.5 MG tablet Take 0.5 mg by mouth 2 (two) times daily as needed for anxiety.     CRESTOR  20 MG tablet TAKE ONE TABLET BY MOUTH EVERY DAY (Patient taking differently: Take 20 mg by mouth at bedtime.) 90 tablet 2   cyclobenzaprine  (FLEXERIL ) 5 MG tablet Take 1 tablet (5 mg total) by mouth at bedtime. 30 tablet 1   diltiazem  (CARDIZEM ) 30 MG tablet Take 1 tablet (30 mg total) by mouth at bedtime. (Patient taking differently: Take 30 mg by mouth daily as needed (High Heart Rate).) 90 tablet 2   fluconazole  (DIFLUCAN ) 200 MG tablet Take 1 tablet (200 mg total) by mouth once a week. Take one tablet every Friday 24 tablet 0   fluticasone  (FLONASE ) 50 MCG/ACT nasal spray INSTILL 2 SPRAYS IN EACH NOSTRIL EVERY DAY (Patient taking differently: Place 2 sprays into both nostrils daily as needed for allergies.) 9.9 mL 2   furosemide  (LASIX ) 40 MG tablet TAKE ONE TABLET BY MOUTH EVERY DAY 90 tablet 2   gabapentin  (NEURONTIN ) 100 MG capsule Take 100 mg by mouth at bedtime as needed (pain).     hydrOXYzine  (ATARAX ) 25 MG tablet Take 25 mg by mouth 3  (three) times daily as needed for itching (allergies).     ibuprofen  (ADVIL ) 600 MG tablet Take 1 tablet (600 mg total) by mouth every 6 (six) hours. Please take every 6 hrs, and stagger this medication 3 hrs apart from Tylenol  30 tablet 0   levocetirizine (XYZAL ) 5 MG tablet TAKE ONE TABLET BY MOUTH EVERY DAY IN THE EVENING 90 tablet 2   LINZESS  290 MCG CAPS capsule TAKE ONE CAPSULE BY MOUTH EVERY DAY BEFORE BREAKFAST (Patient taking differently: Take 290 mcg by mouth daily as needed (constipation).) 90 capsule 2   mupirocin  ointment (BACTROBAN ) 2 % Apply 1 Application topically 2 (two) times daily. 22 g 1   omeprazole  (PRILOSEC) 20 MG capsule Take 1 capsule (20 mg total) by mouth 2 (two) times daily as needed (take 30 minutes prior to a meal). (Patient taking differently: Take 20 mg by mouth at bedtime.) 180 capsule 2   ondansetron  (ZOFRAN ) 4 MG tablet Take 1 tablet (4 mg total) by mouth every 8 (eight) hours as needed for nausea or vomiting. 60 tablet 1   oxyCODONE  (ROXICODONE ) 5 MG immediate release tablet Take 1 tablet (5 mg total) by mouth every 6 (six) hours as needed for severe pain (pain score 7-10). 20 tablet 0   potassium chloride  SA (KLOR-CON  M)  20 MEQ tablet TAKE ONE TABLET BY MOUTH EVERY DAY 90 tablet 3   RYBELSUS  14 MG TABS TAKE ONE TABLET BY MOUTH EVERY DAY - (TAKE WITH NO MORE THAN 4 OUNCES OF PLAIN WATER AT LEAST 30 MINUTES BEFORE THE FIRST FOOD, BEVERAGE, OR OTHER MEDICATIONS OF THE DAY) 90 tablet 2   SUMAtriptan -naproxen  (TREXIMET ) 85-500 MG tablet Take 1 pill as needed for migraine, if migraine persists may repeat x1 after 2 hours 10 tablet 8   TURMERIC PO Take 1 capsule by mouth daily.     valACYclovir  (VALTREX ) 1000 MG tablet Take 2000 mg Q 12 hr x 1 day for outbreak 20 tablet 5   venlafaxine  XR (EFFEXOR -XR) 150 MG 24 hr capsule Take 1 capsule (150 mg total) by mouth 2 (two) times daily. (Patient taking differently: Take 150 mg by mouth daily with breakfast.) 180 capsule 2    Vitamin D , Ergocalciferol , (DRISDOL ) 1.25 MG (50000 UNIT) CAPS capsule Take 1 capsule (50,000 Units total) by mouth every 7 (seven) days. 12 capsule 0   No current facility-administered medications on file prior to visit.    Review of Systems     Objective:  There were no vitals filed for this visit. BP Readings from Last 3 Encounters:  08/23/24 122/83  08/16/24 124/70  07/25/24 119/78   Wt Readings from Last 3 Encounters:  08/23/24 209 lb (94.8 kg)  07/11/24 207 lb (93.9 kg)  06/28/24 209 lb (94.8 kg)   There is no height or weight on file to calculate BMI.    Physical Exam         Assessment & Plan:    See Problem List for Assessment and Plan of chronic medical problems.

## 2024-08-28 ENCOUNTER — Ambulatory Visit (INDEPENDENT_AMBULATORY_CARE_PROVIDER_SITE_OTHER): Admitting: Internal Medicine

## 2024-08-28 ENCOUNTER — Encounter: Payer: Self-pay | Admitting: Internal Medicine

## 2024-08-28 ENCOUNTER — Other Ambulatory Visit: Payer: Self-pay

## 2024-08-28 VITALS — BP 116/82 | HR 75 | Temp 98.1°F | Ht 67.0 in | Wt 214.0 lb

## 2024-08-28 DIAGNOSIS — K5909 Other constipation: Secondary | ICD-10-CM

## 2024-08-28 DIAGNOSIS — Z23 Encounter for immunization: Secondary | ICD-10-CM

## 2024-08-28 DIAGNOSIS — I152 Hypertension secondary to endocrine disorders: Secondary | ICD-10-CM

## 2024-08-28 DIAGNOSIS — R350 Frequency of micturition: Secondary | ICD-10-CM | POA: Diagnosis not present

## 2024-08-28 DIAGNOSIS — N3941 Urge incontinence: Secondary | ICD-10-CM

## 2024-08-28 DIAGNOSIS — E1169 Type 2 diabetes mellitus with other specified complication: Secondary | ICD-10-CM

## 2024-08-28 DIAGNOSIS — E785 Hyperlipidemia, unspecified: Secondary | ICD-10-CM

## 2024-08-28 DIAGNOSIS — E1159 Type 2 diabetes mellitus with other circulatory complications: Secondary | ICD-10-CM

## 2024-08-28 DIAGNOSIS — R300 Dysuria: Secondary | ICD-10-CM

## 2024-08-28 LAB — POC URINALSYSI DIPSTICK (AUTOMATED)
Bilirubin, UA: NEGATIVE
Blood, UA: NEGATIVE
Glucose, UA: NEGATIVE
Ketones, UA: NEGATIVE
Leukocytes, UA: NEGATIVE
Nitrite, UA: NEGATIVE
Protein, UA: POSITIVE — AB
Spec Grav, UA: >=1.03 — AB (ref 1.010–1.025)
Urobilinogen, UA: 0.2 U/dL
pH, UA: 6 (ref 5.0–8.0)

## 2024-08-28 MED ORDER — LINACLOTIDE 145 MCG PO CAPS: 145.0000 ug | ORAL_CAPSULE | Freq: Every day | ORAL | 3 refills | Status: AC

## 2024-08-28 MED ORDER — TIRZEPATIDE 2.5 MG/0.5ML ~~LOC~~ SOAJ: 2.5000 mg | SUBCUTANEOUS | 0 refills | Status: DC

## 2024-08-28 NOTE — Assessment & Plan Note (Addendum)
 Chronic Not controlled Currently taking Linzess  290 mcg daily as needed-often this is too strong.  The lower doses were not effective.  The other day she took this after not having a bowel movement for 4 days Decrease Linzess  to 145 mcg daily-advised to take daily and not as needed Continue probiotics Start MiraLAX  daily Start stool softener daily Can try flaxseed Advised this, nation may need to be revised, but needs to work better on preventing constipation which will also help her bladder Has colonoscopy coming up

## 2024-08-28 NOTE — Assessment & Plan Note (Signed)
 Acute Experiencing urinary frequency and incontinence Incontinence has gotten worse recently Stressed better control of the constipation which will help with incontinence Urine dip.  Negative for infection-Will send for culture just to confirm no infection Referral to urogynecologist

## 2024-08-28 NOTE — Assessment & Plan Note (Signed)
 Chronic Blood pressure well controlled Continue atenolol  25 mg bid

## 2024-08-28 NOTE — Assessment & Plan Note (Addendum)
 Chronic Associated with hyperlipidemia  Lab Results  Component Value Date   HGBA1C 6.1 05/14/2024   Sugars well controlled Currently taking rybelsus  14 mg daily She is doing some exercise now, but is limited secondary to knee pain-encouraged her to be as active as possible Has started to gain some weight-she needs to be more diligent with her diet-increase protein, vegetables and avoid sweets/carbs Will discontinue Rybelsus  and try Mounjaro 2.5 mg weekly

## 2024-08-28 NOTE — Patient Instructions (Addendum)
      Medications changes include :   decreased linzess  to ,  stop rybelsus  and start mounjaro   Start flax seeds, miralax  daily and a stool softener    A referral was ordered urogynecology and someone will call you to schedule an appointment.

## 2024-08-28 NOTE — Assessment & Plan Note (Signed)
 Chronic Worse in the past few weeks Stressed better control of the constipation which could be causing worsening of her urinary incontinence Referral to urogynecology

## 2024-08-28 NOTE — Assessment & Plan Note (Signed)
 Chronic Lab Results  Component Value Date   LDLCALC 77 05/14/2024   Regular exercise and healthy diet encouraged Continue Crestor  20 mg daily

## 2024-08-28 NOTE — Addendum Note (Signed)
 Addended by: CLAUDENE TOBIAS PARAS on: 08/28/2024 02:42 PM   Modules accepted: Orders

## 2024-08-29 ENCOUNTER — Ambulatory Visit

## 2024-08-29 VITALS — Ht 67.0 in | Wt 214.0 lb

## 2024-08-29 DIAGNOSIS — Z1211 Encounter for screening for malignant neoplasm of colon: Secondary | ICD-10-CM

## 2024-08-29 MED ORDER — LINACLOTIDE 290 MCG PO CAPS: 290.0000 ug | ORAL_CAPSULE | Freq: Every day | ORAL | 2 refills | Status: DC

## 2024-08-29 MED ORDER — NA SULFATE-K SULFATE-MG SULF 17.5-3.13-1.6 GM/177ML PO SOLN: 1.0000 | Freq: Once | ORAL | 0 refills | Status: AC

## 2024-08-29 NOTE — Progress Notes (Signed)
 No egg or soy allergy known to patient  No issues known to pt with past sedation with any surgeries or procedures Patient denies ever being told they had issues or difficulty with intubation  No FH of Malignant Hyperthermia Pt is not on diet pills Pt is not on  home 02  Pt is not on blood thinners  Pt has issues with constipation ; Suprep with extra Miralax  ordered for prep No A fib or A flutter Have any cardiac testing pending--No Pt can ambulate  Pt denies use of chewing tobacco Discussed diabetic I weight loss medication holds Discussed NSAID holds Checked BMI Pt instructed to use Singlecare.com or GoodRx for a price reduction on prep  Patient's chart reviewed by Norleen Schillings CNRA prior to previsit and patient appropriate for the [facility].  Pre visit completed and red dot placed by patient's name on their procedure day (on provider's schedule).

## 2024-08-30 ENCOUNTER — Ambulatory Visit: Payer: Self-pay | Admitting: Internal Medicine

## 2024-08-30 ENCOUNTER — Ambulatory Visit

## 2024-08-30 VITALS — BP 110/78 | HR 73 | Ht 67.0 in | Wt 214.0 lb

## 2024-08-30 DIAGNOSIS — Z0000 Encounter for general adult medical examination without abnormal findings: Secondary | ICD-10-CM | POA: Diagnosis not present

## 2024-08-30 LAB — CULTURE, URINE COMPREHENSIVE

## 2024-08-30 NOTE — Patient Instructions (Signed)
 Ms. Levels,  Thank you for taking the time for your Medicare Wellness Visit. I appreciate your continued commitment to your health goals. Please review the care plan we discussed, and feel free to reach out if I can assist you further.  Please note that Annual Wellness Visits do not include a physical exam. Some assessments may be limited, especially if the visit was conducted virtually. If needed, we may recommend an in-person follow-up with your provider.  Ongoing Care Seeing your primary care provider every 3 to 6 months helps us  monitor your health and provide consistent, personalized care. Last office visit on 08/28/2024.  You are due for a bone density screening.   Remember to reach out to your insurance in regards about a glucose monitor.  Asking what brand does the insurance cover so your provider can send in the prescription.  Aim for 30 minutes of exercise or brisk walking, 6-8 glasses of water, and 5 servings of fruits and vegetables each day.   Referrals If a referral was made during today's visit and you haven't received any updates within two weeks, please contact the referred provider directly to check on the status.  Recommended Screenings:  Health Maintenance  Topic Date Due   Colon Cancer Screening  05/08/2024   Osteoporosis screening with Bone Density Scan  07/28/2024   COVID-19 Vaccine (6 - 2025-26 season) 09/13/2024*   DTaP/Tdap/Td vaccine (3 - Td or Tdap) 09/09/2024   Breast Cancer Screening  11/09/2024   Hemoglobin A1C  11/14/2024   Eye exam for diabetics  01/18/2025   Yearly kidney health urinalysis for diabetes  05/14/2025   Yearly kidney function blood test for diabetes  07/11/2025   Complete foot exam   08/28/2025   Medicare Annual Wellness Visit  08/30/2025   Pneumococcal Vaccine for age over 46  Completed   Flu Shot  Completed   Hepatitis C Screening  Completed   Zoster (Shingles) Vaccine  Completed   Meningitis B Vaccine  Aged Out  *Topic was postponed.  The date shown is not the original due date.       08/30/2024   10:12 AM  Advanced Directives  Does Patient Have a Medical Advance Directive? Yes  Type of Advance Directive Healthcare Power of Attorney    Vision: Annual vision screenings are recommended for early detection of glaucoma, cataracts, and diabetic retinopathy. These exams can also reveal signs of chronic conditions such as diabetes and high blood pressure.  Dental: Annual dental screenings help detect early signs of oral cancer, gum disease, and other conditions linked to overall health, including heart disease and diabetes.  Please see the attached documents for additional preventive care recommendations.

## 2024-08-30 NOTE — Progress Notes (Signed)
 Chief Complaint  Patient presents with   Medicare Wellness     Subjective:   Erica [name] is a 68 y.o. female who presents for a Medicare Annual Wellness Visit.  Allergies (verified) Seroquel [quetiapine fumarate], Codeine, Guaifenesin -codeine, and Wellbutrin [bupropion]   History: Past Medical History:  Diagnosis Date   Abnormal stress test    a. 09/2016: NST showed a small defect of mild severity present in the basal inferoseptal and mid inferoseptal location, consistent with ischemia. --> medically managed   ALLERGIC RHINITIS    Anxiety    Bursitis    DDD (degenerative disc disease), lumbar    ESI with Ramos (spring 2016)   Depression    Diabetes mellitus without complication (HCC)    Type II   Dyslipidemia    External hemorrhoids    GERD (gastroesophageal reflux disease)    Hypertension    Obesity    Osteoarthritis    Palpitations    a. prior event monitor showing sinus tachycardia, no PAF.    Panic attacks    Hx of depression   Sleep apnea    No Cpap   Past Surgical History:  Procedure Laterality Date   ABDOMINAL HYSTERECTOMY     DILATION AND CURETTAGE OF UTERUS     DRUG INDUCED ENDOSCOPY N/A 11/29/2023   Procedure: DRUG INDUCED ENDOSCOPY;  Surgeon: Jude Harden GAILS, MD;  Location: [facility] END[facility]COPY;  Service: Pulmonary;  Laterality: N/A;   IMPLANTATION OF HYPOGLOSSAL NERVE STIMULATOR N/A 07/11/2024   Procedure: INSERTION, HYPOGLOSSAL NERVE STIMULATOR;  Surgeon: Okey Burns, MD;  Location: [facility] OR;  Service: ENT;  Laterality: N/A;   MASS EXCISION Left 12/30/2015   Procedure: EXCISION LEFT LEG MASS;  Surgeon: Donnice Lima, MD;  Location: [facility];  Service: General;  Laterality: Left;   NASAL TURBINATE REDUCTION Bilateral 03/04/2022   Procedure: TURBINATE REDUCTION;  Surgeon: Carlie Clark, MD;  Location: [facility] OR;  Service: ENT;  Laterality: Bilateral;   REPLACEMENT TOTAL KNEE Right    RIGHT OOPHORECTOMY     No cancer   Family History   Problem Relation Age of Onset   Heart disease Mother        Enlarged Heart, Pacemaker   Diabetes Mother    Thyroid  disease Mother    Hyperlipidemia Mother    Hypertension Mother    Sleep apnea Mother    Dementia Father    Kidney failure Father    Prostate cancer Father    Alcoholism Father    Allergies Sister    Breast cancer Maternal Aunt        cousin   Prostate cancer Maternal Uncle    Asthma Son    Heart disease Son        CHF, morbid obesity   Diabetes Son    Colon cancer Cousin    Asthma Other    Rectal cancer Neg Hx    Stomach cancer Neg Hx    Esophageal cancer Neg Hx    Social History   Occupational History   Occupation: Disabled  Tobacco Use   Smoking status: Never   Smokeless tobacco: Never   Tobacco comments:    tried to as a teenager  Vaping Use   Vaping status: Never Used  Substance and Sexual Activity   Alcohol use: Yes    Alcohol/week: 1.0 standard drink of alcohol    Types: 1 Glasses of wine per week    Comment: rare   Drug use: No   Sexual activity:  Not Currently   Tobacco Counseling Counseling given: Not Answered Tobacco comments: tried to as a teenager  SDOH Screenings   Food Insecurity: No Food Insecurity (08/30/2023)  Housing: Low Risk  (08/30/2023)  Transportation Needs: No Transportation Needs (08/30/2023)  Utilities: Not At Risk (08/30/2023)  Alcohol Screen: Low Risk  (08/30/2023)  Depression (PHQ2-9): Medium Risk (08/30/2023)  Financial Resource Strain: Low Risk  (08/30/2023)  Physical Activity: Inactive (08/30/2023)  Social Connections: Moderately Integrated (08/30/2023)  Stress: Stress Concern Present (08/30/2023)  Tobacco Use: Low Risk  (08/29/2024)  Health Literacy: Adequate Health Literacy (08/30/2023)   See flowsheets for full screening details  Depression Screen     Goals Addressed   None    Visit info / Clinical Intake: Medicare Wellness Visit Type:: Subsequent Annual Wellness Visit Medicare Wellness Visit  Mode:: In-person (required for WTM) Interpreter Needed?: No Pre-visit prep was completed: no AWV questionnaire completed by patient prior to visit?: no  Fall Screening Falls in the past year?: 0 Number of falls in past year: 0 Was there an injury with Fall?: 0 Fall Risk Category Calculator: 0 Patient Fall Risk Level: Low Fall Risk  Fall Risk Patient at Risk for Falls Due to: No Fall Risks Fall risk Follow up: Falls evaluation completed; Falls prevention discussed  Advance Directives (For Healthcare) Does Patient Have a Medical Advance Directive?: Yes Does patient want to make changes to medical advance directive?: No - Patient declined Type of Advance Directive: Living will Copy of Healthcare Power of Attorney in Chart?: No - copy requested        Objective:    There were no vitals filed for this visit. There is no height or weight on file to calculate BMI.  Current Medications (verified) Outpatient Encounter Medications as of 08/30/2024  Medication Sig   acetaminophen  (TYLENOL ) 500 MG tablet Take 1 tablet (500 mg total) by mouth every 6 (six) hours. Take every 6 hrs x 2-3 days then take every 6 hrs as needed   atenolol  (TENORMIN ) 25 MG tablet Take 1 tablet (25 mg total) by mouth 2 (two) times daily.   clonazePAM (KLONOPIN) 0.5 MG tablet Take 0.5 mg by mouth 2 (two) times daily as needed for anxiety.   CRESTOR  20 MG tablet TAKE ONE TABLET BY MOUTH EVERY DAY   cyclobenzaprine  (FLEXERIL ) 5 MG tablet Take 1 tablet (5 mg total) by mouth at bedtime.   diltiazem  (CARDIZEM ) 30 MG tablet Take 1 tablet (30 mg total) by mouth at bedtime. (Patient not taking: Reported on 08/29/2024)   fluconazole  (DIFLUCAN ) 200 MG tablet Take 1 tablet (200 mg total) by mouth once a week. Take one tablet every Friday   fluticasone  (FLONASE ) 50 MCG/ACT nasal spray INSTILL 2 SPRAYS IN EACH NOSTRIL EVERY DAY (Patient taking differently: Place 2 sprays into both nostrils daily as needed for allergies.)    furosemide  (LASIX ) 40 MG tablet TAKE ONE TABLET BY MOUTH EVERY DAY   gabapentin  (NEURONTIN ) 100 MG capsule Take 100 mg by mouth at bedtime as needed (pain).   hydrOXYzine  (ATARAX ) 25 MG tablet Take 25 mg by mouth 3 (three) times daily as needed for itching (allergies).   ibuprofen  (ADVIL ) 600 MG tablet Take 1 tablet (600 mg total) by mouth every 6 (six) hours. Please take every 6 hrs, and stagger this medication 3 hrs apart from Tylenol    levocetirizine (XYZAL ) 5 MG tablet TAKE ONE TABLET BY MOUTH EVERY DAY IN THE EVENING   linaclotide  (LINZESS ) 145 MCG CAPS capsule Take 1 capsule (145  mcg total) by mouth daily before breakfast.   linaclotide  (LINZESS ) 290 MCG CAPS capsule Take 1 capsule (290 mcg total) by mouth daily before breakfast. TAKE ONE CAPSULE BY MOUTH EVERY DAY BEFORE BREAKFAST   mupirocin  ointment (BACTROBAN ) 2 % Apply 1 Application topically 2 (two) times daily.   omeprazole  (PRILOSEC) 20 MG capsule Take 1 capsule (20 mg total) by mouth 2 (two) times daily as needed (take 30 minutes prior to a meal). (Patient taking differently: Take 20 mg by mouth at bedtime.)   ondansetron  (ZOFRAN ) 4 MG tablet Take 1 tablet (4 mg total) by mouth every 8 (eight) hours as needed for nausea or vomiting.   oxyCODONE  (ROXICODONE ) 5 MG immediate release tablet Take 1 tablet (5 mg total) by mouth every 6 (six) hours as needed for severe pain (pain score 7-10). (Patient not taking: Reported on 08/29/2024)   potassium chloride  SA (KLOR-CON  M) 20 MEQ tablet TAKE ONE TABLET BY MOUTH EVERY DAY   SUMAtriptan -naproxen  (TREXIMET ) 85-500 MG tablet Take 1 pill as needed for migraine, if migraine persists may repeat x1 after 2 hours   tirzepatide (MOUNJARO) 2.5 MG/0.5ML Pen Inject 2.5 mg into the skin once a week. (Patient not taking: Reported on 08/29/2024)   TURMERIC PO Take 1 capsule by mouth daily.   valACYclovir  (VALTREX ) 1000 MG tablet Take 2000 mg Q 12 hr x 1 day for outbreak   venlafaxine  XR (EFFEXOR -XR) 150 MG  24 hr capsule Take 1 capsule (150 mg total) by mouth 2 (two) times daily.   Vitamin D , Ergocalciferol , (DRISDOL ) 1.25 MG (50000 UNIT) CAPS capsule Take 1 capsule (50,000 Units total) by mouth every 7 (seven) days.   No facility-administered encounter medications on file as of 08/30/2024.   Hearing/Vision screen No results found. Immunizations and Health Maintenance Health Maintenance  Topic Date Due   Colonoscopy  05/08/2024   Bone Density Scan  07/28/2024   Medicare Annual Wellness (AWV)  08/29/2024   COVID-19 Vaccine (6 - 2025-26 season) 09/13/2024 (Originally 06/11/2024)   DTaP/Tdap/Td (3 - Td or Tdap) 09/09/2024   Mammogram  11/09/2024   HEMOGLOBIN A1C  11/14/2024   OPHTHALMOLOGY EXAM  01/18/2025   Diabetic kidney evaluation - Urine ACR  05/14/2025   Diabetic kidney evaluation - eGFR measurement  07/11/2025   FOOT EXAM  08/28/2025   Pneumococcal Vaccine: 50+ Years  Completed   Influenza Vaccine  Completed   Hepatitis C Screening  Completed   Zoster Vaccines- Shingrix  Completed   Meningococcal B Vaccine  Aged Out        Assessment/Plan:  This is a routine wellness examination for Erica [name].  Patient Care Team: Geofm Glade PARAS, MD as PCP - General (Internal Medicine) Lavona Agent, MD as PCP - Cardiology (Cardiology) Obie Princella HERO, MD (Inactive) as Consulting Physician (Gastroenterology) Barbette Knock, MD as Consulting Physician (Obstetrics and Gynecology) Bonner Ade, MD as Consulting Physician (Physical Medicine and Rehabilitation) Melodi Lerner, MD as Consulting Physician (Orthopedic Surgery) Shellia Oh, MD (Pulmonary Disease) Fate Morna SAILOR, [facility] (Inactive) as Pharmacist (Pharmacist)  I have personally reviewed and noted the following in the patient's chart:   Medical and social history Use of alcohol, tobacco or illicit drugs  Current medications and supplements including opioid prescriptions. Functional ability and status Nutritional  status Physical activity Advanced directives List of other physicians Hospitalizations, surgeries, and ER visits in previous 12 months Vitals Screenings to include cognitive, depression, and falls Referrals and appointments  No orders of the defined types were placed in this  encounter.  In addition, I have reviewed and discussed with patient certain preventive protocols, quality metrics, and best practice recommendations. A written personalized care plan for preventive services as well as general preventive health recommendations were provided to patient.   Salwa Bai L Costantino Kohlbeck, [name]   08/30/2024   No follow-ups on file.  After Visit Summary: (MyChart) Due to this being a telephonic visit, the after visit summary with patients personalized plan was offered to patient via MyChart   Nurse Notes: Patient is due for a DEXA and she has an appointment scheduled with her GYN in January 2026.  Patient is scheduled for a colonoscopy on 09/11/2024.  She state that she is in need of a glucose monitor along with test strips and needles.  Patient is request a prescription to be sent in to her local pharmacy.

## 2024-09-01 ENCOUNTER — Other Ambulatory Visit: Payer: Self-pay | Admitting: Physician Assistant

## 2024-09-04 ENCOUNTER — Telehealth (HOSPITAL_BASED_OUTPATIENT_CLINIC_OR_DEPARTMENT_OTHER): Payer: Self-pay

## 2024-09-04 NOTE — Telephone Encounter (Signed)
 [employer] number given to pt of [phone]     Copied from CRM [id number]. Topic: Clinical - Medical Advice >> Sep 03, 2024  4:32 PM Rilla B wrote: Reason for CRM: Patient calling to speak with Dr Cyndi nurse.  Patient states her inspire is not working and she needs the nurse. Please call patient @ [phone].

## 2024-09-11 ENCOUNTER — Ambulatory Visit: Admitting: Gastroenterology

## 2024-09-11 ENCOUNTER — Encounter: Payer: Self-pay | Admitting: Gastroenterology

## 2024-09-11 VITALS — BP 142/88 | HR 82 | Temp 97.2°F | Resp 19 | Ht 67.0 in | Wt 214.0 lb

## 2024-09-11 DIAGNOSIS — Z1211 Encounter for screening for malignant neoplasm of colon: Secondary | ICD-10-CM

## 2024-09-11 DIAGNOSIS — K644 Residual hemorrhoidal skin tags: Secondary | ICD-10-CM | POA: Diagnosis not present

## 2024-09-11 DIAGNOSIS — K5730 Diverticulosis of large intestine without perforation or abscess without bleeding: Secondary | ICD-10-CM

## 2024-09-11 MED ORDER — SODIUM CHLORIDE 0.9 % IV SOLN: 500.0000 mL | Freq: Once | INTRAVENOUS | Status: DC

## 2024-09-11 NOTE — Op Note (Signed)
 [facility] Patient Name: Erica [name] Procedure Date: 09/11/2024 2:29 PM MRN: [id number] Endoscopist: Glendia E. Stacia , MD, [id number] Age: 68 Referring MD:  Date of Birth: January 24, 1956 Gender: Female Account #: [id number] Procedure:                Colonoscopy Indications:              Screening for colorectal malignant neoplasm (last                            colonoscopy was 10 years ago) Medicines:                Monitored Anesthesia Care Procedure:                Pre-Anesthesia Assessment:                           - Prior to the procedure, a History and Physical                            was performed, and patient medications and                            allergies were reviewed. The patient's tolerance of                            previous anesthesia was also reviewed. The risks                            and benefits of the procedure and the sedation                            options and risks were discussed with the patient.                            All questions were answered, and informed consent                            was obtained. Prior Anticoagulants: The patient has                            taken no anticoagulant or antiplatelet agents. ASA                            Grade Assessment: III - A patient with severe                            systemic disease. After reviewing the risks and                            benefits, the patient was deemed in satisfactory                            condition to undergo the procedure.  After obtaining informed consent, the colonoscope                            was passed under direct vision. Throughout the                            procedure, the patient's blood pressure, pulse, and                            oxygen saturations were monitored continuously. The                            CF [id number] #[id number] was introduced through the anus                            and advanced to  the the cecum, identified by                            appendiceal orifice and ileocecal valve. The                            colonoscopy was performed without difficulty. The                            patient tolerated the procedure well. The quality                            of the bowel preparation was excellent. The                            ileocecal valve, appendiceal orifice, and rectum                            were photographed. The bowel preparation used was                            SUPREP via split dose instruction. Scope In: 2:43:58 PM Scope Out: 2:58:24 PM Scope Withdrawal Time: 0 hours 9 minutes 21 seconds  Total Procedure Duration: 0 hours 14 minutes 26 seconds  Findings:                 Skin tags were found on perianal exam.                           The digital rectal exam was normal. Pertinent                            negatives include normal sphincter tone and no                            palpable rectal lesions.                           A few small-mouthed diverticula were found in the  sigmoid colon and ascending colon.                           The colon (entire examined portion) otherwise                            appeared normal.                           The retroflexed view of the distal rectum and anal                            verge was normal and showed no anal or rectal                            abnormalities. Complications:            No immediate complications. Estimated Blood Loss:     Estimated blood loss: none. Impression:               - Perianal skin tags found on perianal exam.                           - Mild diverticulosis in the sigmoid colon and in                            the ascending colon.                           - The entire examined colon is normal.                           - The distal rectum and anal verge are normal on                            retroflexion view.                            - No specimens collected. Recommendation:           - Patient has a contact number available for                            emergencies. The signs and symptoms of potential                            delayed complications were discussed with the                            patient. Return to normal activities tomorrow.                            Written discharge instructions were provided to the                            patient.                           -  Resume previous diet.                           - Continue present medications.                           - Given patient's age and lack of polyps or other                            risk factors for colon cancer, recommend against                            any further colon cancer screening Aleia Larocca E. Stacia, MD 09/11/2024 3:03:21 PM This report has been signed electronically.

## 2024-09-11 NOTE — Progress Notes (Signed)
 Pt's states no medical or surgical changes since previsit or office visit.

## 2024-09-11 NOTE — Progress Notes (Signed)
 Report to PACU, RN, vss, BBS= Clear.

## 2024-09-11 NOTE — Progress Notes (Signed)
 [facility] History and Physical   Primary Care Physician:  Geofm Glade PARAS, MD   Reason for Procedure:   Colon cancer screening  Plan:    Screening colonoscopy  The patient was provided an opportunity to ask questions and all were answered. The patient agreed with the plan    HPI: Erica [name] is a 68 y.o. female undergoing average risk screening colonoscopy.  She has no family history of colon cancer (other than a cousin) and no chronic GI symptoms other than chronic constipation.  She had a normal colonoscopy in 2015 and 2005.  Past Medical History:  Diagnosis Date   Abnormal stress test    a. 09/2016: NST showed a small defect of mild severity present in the basal inferoseptal and mid inferoseptal location, consistent with ischemia. --> medically managed   ALLERGIC RHINITIS    Anxiety    Bursitis    DDD (degenerative disc disease), lumbar    ESI with Ramos (spring 2016)   Depression    Diabetes mellitus without complication (HCC)    Type II   Dyslipidemia    External hemorrhoids    GERD (gastroesophageal reflux disease)    Hypertension    Obesity    Osteoarthritis    Palpitations    a. prior event monitor showing sinus tachycardia, no PAF.    Panic attacks    Hx of depression   Sleep apnea    No Cpap    Past Surgical History:  Procedure Laterality Date   ABDOMINAL HYSTERECTOMY     DILATION AND CURETTAGE OF UTERUS     DRUG INDUCED ENDOSCOPY N/A 11/29/2023   Procedure: DRUG INDUCED ENDOSCOPY;  Surgeon: Jude Harden GAILS, MD;  Location: [facility] ENDOSCOPY;  Service: Pulmonary;  Laterality: N/A;   IMPLANTATION OF HYPOGLOSSAL NERVE STIMULATOR N/A 07/11/2024   Procedure: INSERTION, HYPOGLOSSAL NERVE STIMULATOR;  Surgeon: Okey Burns, MD;  Location: [facility] OR;  Service: ENT;  Laterality: N/A;   MASS EXCISION Left 12/30/2015   Procedure: EXCISION LEFT LEG MASS;  Surgeon: Donnice Lima, MD;  Location: [facility];  Service: General;  Laterality:  Left;   NASAL TURBINATE REDUCTION Bilateral 03/04/2022   Procedure: TURBINATE REDUCTION;  Surgeon: Carlie Clark, MD;  Location: [facility] OR;  Service: ENT;  Laterality: Bilateral;   REPLACEMENT TOTAL KNEE Right    RIGHT OOPHORECTOMY     No cancer    Prior to Admission medications   Medication Sig Start Date End Date Taking? Authorizing Provider  atenolol  (TENORMIN ) 25 MG tablet Take 1 tablet (25 mg total) by mouth 2 (two) times daily. 03/08/24  Yes Lavona Agent, MD  CRESTOR  20 MG tablet TAKE ONE TABLET BY MOUTH EVERY DAY 05/10/24  Yes Burns, Glade PARAS, MD  diltiazem  (CARDIZEM ) 30 MG tablet Take 1 tablet (30 mg total) by mouth at bedtime. 03/08/24  Yes Lavona Agent, MD  fluticasone  (FLONASE ) 50 MCG/ACT nasal spray INSTILL 2 SPRAYS IN EACH NOSTRIL EVERY DAY Patient taking differently: Place 2 sprays into both nostrils daily as needed for allergies. 12/15/23  Yes Burns, Glade PARAS, MD  furosemide  (LASIX ) 40 MG tablet TAKE ONE TABLET BY MOUTH EVERY DAY 08/09/24  Yes Burns, Glade PARAS, MD  levocetirizine (XYZAL ) 5 MG tablet TAKE ONE TABLET BY MOUTH EVERY DAY IN THE EVENING 10/13/23  Yes Burns, Glade PARAS, MD  potassium chloride  SA (KLOR-CON  M) 20 MEQ tablet TAKE ONE TABLET BY MOUTH EVERY DAY 12/15/23  Yes Geofm Glade PARAS, MD  venlafaxine  XR (EFFEXOR -XR) 150 MG 24  hr capsule Take 1 capsule (150 mg total) by mouth 2 (two) times daily. 11/11/22  Yes Burns, Glade PARAS, MD  Vitamin D , Ergocalciferol , (DRISDOL ) 1.25 MG (50000 UNIT) CAPS capsule Take 1 capsule (50,000 Units total) by mouth every 7 (seven) days. 08/16/24  Yes Claudene Arthea HERO, DO  acetaminophen  (TYLENOL ) 500 MG tablet Take 1 tablet (500 mg total) by mouth every 6 (six) hours. Take every 6 hrs x 2-3 days then take every 6 hrs as needed 07/11/24   Soldatova, Liuba, MD  clonazePAM (KLONOPIN) 0.5 MG tablet Take 0.5 mg by mouth 2 (two) times daily as needed for anxiety. 09/15/22   [provider]  cyclobenzaprine  (FLEXERIL ) 5 MG tablet Take 1 tablet (5 mg total) by  mouth at bedtime. 05/01/24   Claudene Arthea HERO, DO  fluconazole  (DIFLUCAN ) 200 MG tablet Take 1 tablet (200 mg total) by mouth once a week. Take one tablet every Friday Patient not taking: Reported on 08/30/2024 06/07/24   Sandridge, Brenda K, PA-C  gabapentin  (NEURONTIN ) 100 MG capsule Take 100 mg by mouth at bedtime as needed (pain).    [provider]  hydrOXYzine  (ATARAX ) 25 MG tablet Take 25 mg by mouth 3 (three) times daily as needed for itching (allergies).    [provider]  ibuprofen  (ADVIL ) 600 MG tablet Take 1 tablet (600 mg total) by mouth every 6 (six) hours. Please take every 6 hrs, and stagger this medication 3 hrs apart from Tylenol  Patient not taking: No sig reported 07/11/24   Okey Burns, MD  linaclotide  (LINZESS ) 145 MCG CAPS capsule Take 1 capsule (145 mcg total) by mouth daily before breakfast. Patient not taking: Reported on 08/30/2024 08/28/24   Geofm Glade PARAS, MD  linaclotide  (LINZESS ) 290 MCG CAPS capsule Take 1 capsule (290 mcg total) by mouth daily before breakfast. TAKE ONE CAPSULE BY MOUTH EVERY DAY BEFORE BREAKFAST 08/29/24   Geofm Glade PARAS, MD  mupirocin  ointment (BACTROBAN ) 2 % Apply 1 Application topically 2 (two) times daily. Patient not taking: Reported on 08/30/2024 07/04/24   Paci, Karina M, MD  omeprazole  (PRILOSEC) 20 MG capsule Take 1 capsule (20 mg total) by mouth 2 (two) times daily as needed (take 30 minutes prior to a meal). Patient taking differently: Take 20 mg by mouth at bedtime. 11/11/22   Geofm Glade PARAS, MD  ondansetron  (ZOFRAN ) 4 MG tablet Take 1 tablet (4 mg total) by mouth every 8 (eight) hours as needed for nausea or vomiting. 11/15/23   Geofm Glade PARAS, MD  oxyCODONE  (ROXICODONE ) 5 MG immediate release tablet Take 1 tablet (5 mg total) by mouth every 6 (six) hours as needed for severe pain (pain score 7-10). Patient not taking: No sig reported 07/11/24 07/11/25  Okey Burns, MD  SUMAtriptan -naproxen  (TREXIMET ) 85-500 MG tablet  Take 1 pill as needed for migraine, if migraine persists may repeat x1 after 2 hours Patient not taking: Reported on 08/30/2024 05/11/22   Geofm Glade PARAS, MD  tirzepatide  (MOUNJARO ) 2.5 MG/0.5ML Pen Inject 2.5 mg into the skin once a week. 08/28/24   Geofm Glade PARAS, MD  TURMERIC PO Take 1 capsule by mouth daily.    [provider]  valACYclovir  (VALTREX ) 1000 MG tablet Take 2000 mg Q 12 hr x 1 day for outbreak 05/14/24   Geofm Glade PARAS, MD    Current Outpatient Medications  Medication Sig Dispense Refill   atenolol  (TENORMIN ) 25 MG tablet Take 1 tablet (25 mg total) by mouth 2 (two) times daily. 180  tablet 3   CRESTOR  20 MG tablet TAKE ONE TABLET BY MOUTH EVERY DAY 90 tablet 2   diltiazem  (CARDIZEM ) 30 MG tablet Take 1 tablet (30 mg total) by mouth at bedtime. 90 tablet 2   fluticasone  (FLONASE ) 50 MCG/ACT nasal spray INSTILL 2 SPRAYS IN EACH NOSTRIL EVERY DAY (Patient taking differently: Place 2 sprays into both nostrils daily as needed for allergies.) 9.9 mL 2   furosemide  (LASIX ) 40 MG tablet TAKE ONE TABLET BY MOUTH EVERY DAY 90 tablet 2   levocetirizine (XYZAL ) 5 MG tablet TAKE ONE TABLET BY MOUTH EVERY DAY IN THE EVENING 90 tablet 2   potassium chloride  SA (KLOR-CON  M) 20 MEQ tablet TAKE ONE TABLET BY MOUTH EVERY DAY 90 tablet 3   venlafaxine  XR (EFFEXOR -XR) 150 MG 24 hr capsule Take 1 capsule (150 mg total) by mouth 2 (two) times daily. 180 capsule 2   Vitamin D , Ergocalciferol , (DRISDOL ) 1.25 MG (50000 UNIT) CAPS capsule Take 1 capsule (50,000 Units total) by mouth every 7 (seven) days. 12 capsule 0   acetaminophen  (TYLENOL ) 500 MG tablet Take 1 tablet (500 mg total) by mouth every 6 (six) hours. Take every 6 hrs x 2-3 days then take every 6 hrs as needed 30 tablet 0   clonazePAM (KLONOPIN) 0.5 MG tablet Take 0.5 mg by mouth 2 (two) times daily as needed for anxiety.     cyclobenzaprine  (FLEXERIL ) 5 MG tablet Take 1 tablet (5 mg total) by mouth at bedtime. 30 tablet 1   fluconazole   (DIFLUCAN ) 200 MG tablet Take 1 tablet (200 mg total) by mouth once a week. Take one tablet every Friday (Patient not taking: Reported on 08/30/2024) 24 tablet 0   gabapentin  (NEURONTIN ) 100 MG capsule Take 100 mg by mouth at bedtime as needed (pain).     hydrOXYzine  (ATARAX ) 25 MG tablet Take 25 mg by mouth 3 (three) times daily as needed for itching (allergies).     ibuprofen  (ADVIL ) 600 MG tablet Take 1 tablet (600 mg total) by mouth every 6 (six) hours. Please take every 6 hrs, and stagger this medication 3 hrs apart from Tylenol  (Patient not taking: No sig reported) 30 tablet 0   linaclotide  (LINZESS ) 145 MCG CAPS capsule Take 1 capsule (145 mcg total) by mouth daily before breakfast. (Patient not taking: Reported on 08/30/2024) 90 capsule 3   linaclotide  (LINZESS ) 290 MCG CAPS capsule Take 1 capsule (290 mcg total) by mouth daily before breakfast. TAKE ONE CAPSULE BY MOUTH EVERY DAY BEFORE BREAKFAST 90 capsule 2   mupirocin  ointment (BACTROBAN ) 2 % Apply 1 Application topically 2 (two) times daily. (Patient not taking: Reported on 08/30/2024) 22 g 1   omeprazole  (PRILOSEC) 20 MG capsule Take 1 capsule (20 mg total) by mouth 2 (two) times daily as needed (take 30 minutes prior to a meal). (Patient taking differently: Take 20 mg by mouth at bedtime.) 180 capsule 2   ondansetron  (ZOFRAN ) 4 MG tablet Take 1 tablet (4 mg total) by mouth every 8 (eight) hours as needed for nausea or vomiting. 60 tablet 1   oxyCODONE  (ROXICODONE ) 5 MG immediate release tablet Take 1 tablet (5 mg total) by mouth every 6 (six) hours as needed for severe pain (pain score 7-10). (Patient not taking: No sig reported) 20 tablet 0   SUMAtriptan -naproxen  (TREXIMET ) 85-500 MG tablet Take 1 pill as needed for migraine, if migraine persists may repeat x1 after 2 hours (Patient not taking: Reported on 08/30/2024) 10 tablet 8   tirzepatide  (  MOUNJARO ) 2.5 MG/0.5ML Pen Inject 2.5 mg into the skin once a week. 2 mL 0   TURMERIC PO Take 1  capsule by mouth daily.     valACYclovir  (VALTREX ) 1000 MG tablet Take 2000 mg Q 12 hr x 1 day for outbreak 20 tablet 5   Current Facility-Administered Medications  Medication Dose Route Frequency Provider Last Rate Last Admin   0.9 %  sodium chloride  infusion  500 mL Intravenous Once Erica Glendia BRAVO, MD        Allergies as of 09/11/2024 - Review Complete 09/11/2024  Allergen Reaction Noted   Seroquel [quetiapine fumarate] Other (See Comments) 05/18/2019   Codeine Nausea And Vomiting 01/23/2014   Guaifenesin -codeine Other (See Comments) 01/12/2008   Wellbutrin [bupropion] Palpitations 09/20/2011    Family History  Problem Relation Age of Onset   Heart disease Mother        Enlarged Heart, Pacemaker   Diabetes Mother    Thyroid  disease Mother    Hyperlipidemia Mother    Hypertension Mother    Sleep apnea Mother    Dementia Father    Kidney failure Father    Prostate cancer Father    Alcoholism Father    Allergies Sister    Breast cancer Maternal Aunt        cousin   Prostate cancer Maternal Uncle    Asthma Son    Heart disease Son        CHF, morbid obesity   Diabetes Son    Colon cancer Cousin    Asthma Other    Rectal cancer Neg Hx    Stomach cancer Neg Hx    Esophageal cancer Neg Hx     Social History   Socioeconomic History   Marital status: Married    Spouse name: otis   Number of children: 3   Years of education: Not on file   Highest education level: Not on file  Occupational History   Occupation: Disabled  Tobacco Use   Smoking status: Never   Smokeless tobacco: Never   Tobacco comments:    tried to as a teenager  Advertising Account Planner   Vaping status: Never Used  Substance and Sexual Activity   Alcohol use: Yes    Alcohol/week: 1.0 standard drink of alcohol    Types: 1 Glasses of wine per week    Comment: rare   Drug use: No   Sexual activity: Not Currently  Other Topics Concern   Not on file  Social History Narrative   Married with children. Pt  is on disablity. Previous worked in the school system      Lives with husband/2025   Social Drivers of Health   Financial Resource Strain: Low Risk  (08/30/2023)   Overall Financial Resource Strain (CARDIA)    Difficulty of Paying Living Expenses: Not hard at all  Food Insecurity: Food Insecurity Present (08/30/2024)   Hunger Vital Sign    Worried About Running Out of Food in the Last Year: Sometimes true    Ran Out of Food in the Last Year: Sometimes true  Transportation Needs: No Transportation Needs (08/30/2024)   PRAPARE - Administrator, Civil Service (Medical): No    Lack of Transportation (Non-Medical): No  Physical Activity: Inactive (08/30/2024)   Exercise Vital Sign    Days of Exercise per Week: 0 days    Minutes of Exercise per Session: 0 min  Stress: No Stress Concern Present (08/30/2024)   [employer] of Occupational Health -  Occupational Stress Questionnaire    Feeling of Stress: Only a little  Social Connections: Socially Integrated (08/30/2024)   Social Connection and Isolation Panel    Frequency of Communication with Friends and Family: More than three times a week    Frequency of Social Gatherings with Friends and Family: Once a week    Attends Religious Services: More than 4 times per year    Active Member of [employer] or Organizations: Yes    Attends Engineer, Structural: More than 4 times per year    Marital Status: Married  Catering Manager Violence: Not At Risk (08/30/2024)   Humiliation, Afraid, Rape, and Kick questionnaire    Fear of Current or Ex-Partner: No    Emotionally Abused: No    Physically Abused: No    Sexually Abused: No    Review of Systems:  All other review of systems negative except as mentioned in the HPI.  Physical Exam: Vital signs BP 123/86   Pulse 82   Temp (!) 97.2 F (36.2 C)   Ht 5' 7 (1.702 m)   Wt 214 lb (97.1 kg)   SpO2 99%   BMI 33.52 kg/m   General:   Alert,  Well-developed,  well-nourished, pleasant and cooperative in NAD Airway:  Mallampati 3 Lungs:  Clear throughout to auscultation.   Heart:  Regular rate and rhythm; no murmurs, clicks, rubs,  or gallops. Abdomen:  Soft, nontender and nondistended. Normal bowel sounds.   Neuro/Psych:  Normal mood and affect. A and O x 3   Rigel Filsinger E. Stacia, MD [facility]

## 2024-09-11 NOTE — Patient Instructions (Addendum)
 Continue present medications. Given patient's age and lack of polyps or other risk factors for colon cancer, recommend against any further colon cancer screening   YOU HAD AN ENDOSCOPIC PROCEDURE TODAY AT THE [facility]:   Refer to the procedure report that was given to you for any specific questions about what was found during the examination.  If the procedure report does not answer your questions, please call your gastroenterologist to clarify.  If you requested that your care partner not be given the details of your procedure findings, then the procedure report has been included in a sealed envelope for you to review at your convenience later.  YOU SHOULD EXPECT: Some feelings of bloating in the abdomen. Passage of more gas than usual.  Walking can help get rid of the air that was put into your GI tract during the procedure and reduce the bloating. If you had a lower endoscopy (such as a colonoscopy or flexible sigmoidoscopy) you may notice spotting of blood in your stool or on the toilet paper. If you underwent a bowel prep for your procedure, you may not have a normal bowel movement for a few days.  Please Note:  You might notice some irritation and congestion in your nose or some drainage.  This is from the oxygen used during your procedure.  There is no need for concern and it should clear up in a day or so.  SYMPTOMS TO REPORT IMMEDIATELY:  Following lower endoscopy (colonoscopy or flexible sigmoidoscopy):  Excessive amounts of blood in the stool  Significant tenderness or worsening of abdominal pains  Swelling of the abdomen that is new, acute  Fever of 100F or higher For urgent or emergent issues, a gastroenterologist can be reached at any hour by calling [phone]. Do not use MyChart messaging for urgent concerns.    DIET:  We do recommend a small meal at first, but then you may proceed to your regular diet.  Drink plenty of fluids but you should avoid alcoholic  beverages for 24 hours.  ACTIVITY:  You should plan to take it easy for the rest of today and you should NOT DRIVE or use heavy machinery until tomorrow (because of the sedation medicines used during the test).    FOLLOW UP: Our staff will call the number listed on your records the next business day following your procedure.  We will call around 7:15- 8:00 am to check on you and address any questions or concerns that you may have regarding the information given to you following your procedure. If we do not reach you, we will leave a message.      SIGNATURES/CONFIDENTIALITY: You and/or your care partner have signed paperwork which will be entered into your electronic medical record.  These signatures attest to the fact that that the information above on your After Visit Summary has been reviewed and is understood.  Full responsibility of the confidentiality of this discharge information lies with you and/or your care-partner.

## 2024-09-12 ENCOUNTER — Telehealth: Payer: Self-pay | Admitting: Lactation Services

## 2024-09-12 NOTE — Telephone Encounter (Signed)
 No answer left voice mail

## 2024-09-17 ENCOUNTER — Ambulatory Visit: Admitting: Orthopaedic Surgery

## 2024-09-24 ENCOUNTER — Ambulatory Visit: Admitting: Orthopaedic Surgery

## 2024-09-24 ENCOUNTER — Other Ambulatory Visit

## 2024-09-24 VITALS — Wt 220.0 lb

## 2024-09-24 DIAGNOSIS — Z96659 Presence of unspecified artificial knee joint: Secondary | ICD-10-CM | POA: Insufficient documentation

## 2024-09-24 DIAGNOSIS — Z96651 Presence of right artificial knee joint: Secondary | ICD-10-CM

## 2024-09-24 NOTE — Progress Notes (Signed)
 The patient is a 68 year old active female sent to me from Dr. Arthea Sharps to evaluate and treat a painful right total knee arthroplasty.  Her knee was actually replaced over 20 years ago by Dr. Hiram here in [place].  She has had pain in that knee for a few years now and some symptoms of instability.  A year ago a three-phase bone scan was ordered by Dr. Sharps to rule out prosthetic loosening and it just showed some synovitis.  This can be more consistent with polyliner wear.  She has developed some right hip pain and bursitis from time to time it flares up due to walking different as a relates to her right knee.  She is a diabetic but reports blood glucose control that has been good.  Her BMI is 34.46.  She says the knee does hurt when walking and feels like it is turning and.  Examination of the right knee today shows a well-healed midline surgical incision over the patella.  There is just a slight effusion of the left knee but good range of motion overall but there is a little bit of plate in terms of instability of the knee.  X-rays of the right knee today show narrowing of the joint space suggesting polyethylene liner wear.  There is no evidence of osteolysis.  I do not see any loosening of the femoral or tibial components.  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  We have recommended a right knee open arthrotomy with synovectomy and a polyliner exchange.  Thus far, it looks like that the femoral and tibial components are intact and can be maintained with just upsizing the poly liner.  I believe the synovitis and the symptoms she is experiencing are due to the poly liner wear.  I did show her a knee model and explained in detail what is going on with her knee.  It does not look like there is any type of infectious process going on with this knee.  I described what surgery would involve and discussed the risk and benefits of  surgery and this recommendation.  She is welcome to think about this as well.  She would like us  to go ahead and work on scheduling her for surgery due to the symptoms that she has still been experiencing for several years now that are slowly getting worse.  All questions and concerns were answered and addressed.  We will work on getting her scheduled for the surgery.  Of note she has tried all forms of conservative treatment and given the significance of her polyliner wear.  There is no other treatments that we can recommend prior to surgery or any treatments that could avert surgery given the polyliner wear.

## 2024-09-25 NOTE — Telephone Encounter (Signed)
 Copied from CRM #[id number]. Topic: Clinical - Medication Question >> Sep 25, 2024 10:51 AM Rozanna MATSU wrote: Reason for CRM: pt has some questions about her inspire device. Thanks   Please advise.

## 2024-09-25 NOTE — Telephone Encounter (Signed)
 Spoke to pt and she needed me to my chart her the [place] phone number also needs to Reschedule appt for 09/27/2024 as her daughter is having surgery that day.

## 2024-09-27 ENCOUNTER — Ambulatory Visit (HOSPITAL_BASED_OUTPATIENT_CLINIC_OR_DEPARTMENT_OTHER): Admitting: Pulmonary Disease

## 2024-09-27 ENCOUNTER — Encounter (HOSPITAL_BASED_OUTPATIENT_CLINIC_OR_DEPARTMENT_OTHER): Payer: Self-pay | Admitting: Pulmonary Disease

## 2024-10-09 NOTE — Telephone Encounter (Signed)
 Spoke with Erica [name] rescheduled Inspire follow-up with Dr. Jude on Thursday, 11/08/24 @10 :30 am. Nothing further needed at this time.

## 2024-10-16 ENCOUNTER — Other Ambulatory Visit: Payer: Self-pay | Admitting: Internal Medicine

## 2024-10-22 ENCOUNTER — Other Ambulatory Visit: Payer: Self-pay

## 2024-10-22 ENCOUNTER — Telehealth: Payer: Self-pay

## 2024-10-22 DIAGNOSIS — E1169 Type 2 diabetes mellitus with other specified complication: Secondary | ICD-10-CM

## 2024-10-22 MED ORDER — ACCU-CHEK GUIDE TEST VI STRP: ORAL_STRIP | 12 refills | Status: AC

## 2024-10-22 MED ORDER — ACCU-CHEK SOFTCLIX LANCETS MISC: 12 refills | Status: AC

## 2024-10-22 MED ORDER — ACCU-CHEK GUIDE ME W/DEVICE KIT: 1.0000 | PACK | 0 refills | Status: AC

## 2024-10-22 NOTE — Telephone Encounter (Signed)
 Spoke with patient and new glucometer sent in with testing supplies.

## 2024-10-22 NOTE — Telephone Encounter (Signed)
 Copied from CRM #[id number]. Topic: Clinical - Medication Question >> Oct 22, 2024 12:32 PM Thersia BROCKS wrote: Reason for CRM: only way to get diabetic supplies if she can write a prescription , to check sugar  [id number] [place], [place] - [address] [place] [address] Phone: [phone] Fax: [phone]   ACCUTIVE

## 2024-11-02 NOTE — Progress Notes (Signed)
 Sent message, via epic in basket, requesting orders in epic from Careers adviser.

## 2024-11-02 NOTE — Progress Notes (Signed)
Pt. Needs orders for upcoming surgery. ?

## 2024-11-02 NOTE — Patient Instructions (Signed)
 SURGICAL WAITING ROOM VISITATION Patients having surgery or a procedure may have no more than 2 support people in the waiting area - these visitors may rotate in the visitor waiting room.   Due to an increase in RSV and influenza rates and associated hospitalizations, children ages 10 and under may not visit patients in [facility] hospitals. If the patient needs to stay at the hospital during part of their recovery, the visitor guidelines for inpatient rooms apply.  PRE-OP VISITATION  Pre-op nurse will coordinate an appropriate time for 1 support person to accompany the patient in pre-op.  This support person may not rotate.  This visitor will be contacted when the time is appropriate for the visitor to come back in the pre-op area.  Please refer to the [facility] website for the visitor guidelines for Inpatients (after your surgery is over and you are in a regular room).  You are not required to quarantine at this time prior to your surgery. However, you must do this: Hand Hygiene often Do NOT share personal items Notify your provider if you are in close contact with someone who has COVID or you develop fever 100.4 or greater, new onset of sneezing, cough, sore throat, shortness of breath or body aches.  If you test positive for Covid or have been in contact with anyone that has tested positive in the last 10 days please notify you surgeon.    Your procedure is scheduled on:  11/16/24  Report to [facility] Main Entrance: [place] entrance where the [employer] is available.   Report to admitting at: 11:45 AM  Call this number if you have any questions or problems the morning of surgery [phone]  FOLLOW ANY ADDITIONAL PRE OP INSTRUCTIONS YOU RECEIVED FROM YOUR SURGEON'S OFFICE!!!  Do not eat food after Midnight the night prior to your surgery/procedure.  After Midnight you may have the following liquids until: 11:00 AM DAY OF SURGERY  Clear Liquid Diet Water Black  Coffee (sugar ok, NO MILK/CREAM OR CREAMERS)  Tea (sugar ok, NO MILK/CREAM OR CREAMERS) regular and decaf                             Plain Jell-O  with no fruit (NO RED)                                           Fruit ices (not with fruit pulp, NO RED)                                     Popsicles (NO RED)                                                                  Juice: NO CITRUS JUICES: only apple, WHITE grape, WHITE cranberry Sports drinks like Gatorade or Powerade (NO RED)   The day of surgery:  Drink ONE (1) Pre-Surgery Clear G2 at: 11:00 AM the morning of surgery. Drink in one sitting. Do not sip.  This drink was given to  you during your hospital pre-op appointment visit. Nothing else to drink after completing the Pre-Surgery Clear Ensure or G2 : No candy, chewing gum or throat lozenges.    Oral Hygiene is also important to reduce your risk of infection.        Remember - BRUSH YOUR TEETH THE MORNING OF SURGERY WITH YOUR REGULAR TOOTHPASTE  Do NOT smoke after Midnight the night before surgery.  STOP TAKING all Vitamins, Herbs and supplements 1 week before your surgery.   Take ONLY these medicines the morning of surgery with A SIP OF WATER: atenolol ,clonazepam,linzess .Tylenol ,hydroxyzine  as needed.  If You have been diagnosed with Sleep Apnea - Bring CPAP mask and tubing day of surgery. We will provide you with a CPAP machine on the day of your surgery.                   You may not have any metal on your body including hair pins, jewelry, and body piercing  Do not wear make-up, lotions, powders, perfumes / cologne, or deodorant  Do not wear nail polish including gel and S&S, artificial / acrylic nails, or any other type of covering on natural nails including finger and toenails. If you have artificial nails, gel coating, etc., that needs to be removed by a nail salon, Please have this removed prior to surgery. Not doing so may mean that your surgery could be cancelled or  delayed if the Surgeon or anesthesia staff feels like they are unable to monitor you safely.   Do not shave 48 hours prior to surgery to avoid nicks in your skin which may contribute to postoperative infections.   Contacts, Hearing Aids, dentures or bridgework may not be worn into surgery. DENTURES WILL BE REMOVED PRIOR TO SURGERY PLEASE DO NOT APPLY Poly grip OR ADHESIVES!!!  You may bring a small overnight bag with you on the day of surgery, only pack items that are not valuable. [facility] IS NOT RESPONSIBLE   FOR VALUABLES THAT ARE LOST OR STOLEN.   Patients discharged on the day of surgery will not be allowed to drive home.  Someone NEEDS to stay with you for the first 24 hours after anesthesia.  Do not bring your home medications to the hospital. The Pharmacy will dispense medications listed on your medication list to you during your admission in the Hospital.  Special Instructions: Bring a copy of your healthcare power of attorney and living will documents the day of surgery, if you wish to have them scanned into your [facility] Records- EPIC  Please read over the following fact sheets you were given: IF YOU HAVE QUESTIONS ABOUT YOUR PRE-OP INSTRUCTIONS, PLEASE CALL [phone]   [facility] - Preparing for Surgery      Before surgery, you can play an important role.  Because skin is not sterile, your skin needs to be as free of germs as possible.  You can reduce the number of germs on your skin by washing with CHG (chlorahexidine gluconate) soap before surgery.  CHG is an antiseptic cleaner which kills germs and bonds with the skin to continue killing germs even after washing. Please DO NOT use if you have an allergy to CHG or antibacterial soaps.  If your skin becomes reddened/irritated stop using the CHG and inform your nurse when you arrive at Short Stay. Do not shave (including legs and underarms) for at least 48 hours prior to the first CHG shower.  You may shave your  face/neck.  Please  follow these instructions carefully:  1.  Shower with CHG Soap the night before surgery ONLY (DO NOT USE THE SOAP THE MORNING OF SURGERY).  2.  If you choose to wash your hair, wash your hair first as usual with your normal  shampoo.  3.  After you shampoo, rinse your hair and body thoroughly to remove the shampoo.                             4.  Use CHG as you would any other liquid soap.  You can apply chg directly to the skin and wash.  Gently with a scrungie or clean washcloth.  5.  Apply the CHG Soap to your body ONLY FROM THE NECK DOWN.   Do not use on face/ open                           Wound or open sores. Avoid contact with eyes, ears mouth and genitals (private parts).                       Wash face,  Genitals (private parts) with your normal soap.             6.  Wash thoroughly, paying special attention to the area where your  surgery  will be performed.  7.  Thoroughly rinse your body with warm water from the neck down.  8.  DO NOT shower/wash with your normal soap after using and rinsing off the CHG Soap.                9.  Pat yourself dry with a clean towel.            10.  Wear clean pajamas.            11.  Place clean sheets on your bed the night of your first shower and do not  sleep with pets.  Day of Surgery : Do not apply any CHG, lotions/deodorants the morning of surgery.  Please wear clean clothes to the hospital/surgery center.   FAILURE TO FOLLOW THESE INSTRUCTIONS MAY RESULT IN THE CANCELLATION OF YOUR SURGERY  PATIENT SIGNATURE_________________________________  NURSE SIGNATURE__________________________________  ________________________________________________________________________

## 2024-11-06 ENCOUNTER — Encounter (HOSPITAL_COMMUNITY)
Admission: RE | Admit: 2024-11-06 | Discharge: 2024-11-06 | Disposition: A | Discharge status: Home / Self Care | Source: Ambulatory Visit | Attending: Orthopaedic Surgery | Admitting: Orthopaedic Surgery

## 2024-11-06 ENCOUNTER — Ambulatory Visit: Admitting: Dermatology

## 2024-11-06 ENCOUNTER — Other Ambulatory Visit: Payer: Self-pay

## 2024-11-06 ENCOUNTER — Encounter (HOSPITAL_COMMUNITY): Payer: Self-pay

## 2024-11-06 VITALS — Ht 67.0 in | Wt 215.0 lb

## 2024-11-06 DIAGNOSIS — E1159 Type 2 diabetes mellitus with other circulatory complications: Secondary | ICD-10-CM

## 2024-11-06 DIAGNOSIS — E66812 Obesity, class 2: Secondary | ICD-10-CM

## 2024-11-06 DIAGNOSIS — Z01818 Encounter for other preprocedural examination: Secondary | ICD-10-CM

## 2024-11-06 NOTE — Progress Notes (Signed)
 For Anesthesia: PCP - Geofm Glade PARAS, MD  Cardiologist - Lavona Agent, MD   Bowel Prep reminder:  Chest x-ray - 07/11/24 EKG - 03/08/24 Stress Test -  ECHO - 10/21/16 Cardiac Cath -  Pacemaker/ICD device last checked: Pacemaker orders received: Device Rep notified:  Spinal Cord Stimulator:N/A  Sleep Study - Yes : Hypoglossal nerve stimulator. CPAP - NO  Fasting Blood Sugar - 100's Checks Blood Sugar ___2__ times a week. Date and result of last Hgb A1c- 6.1: 05/14/24  Last dose of GLP1 agonist- Mounjaro . Last dose: 11/05/24 GLP1 instructions: Hold 7 days prior to schedule (Hold 24 hours-daily)   Last dose of SGLT-2 inhibitors- N/A SGLT-2 instructions: Hold 72 hours prior to surgery  Blood Thinner Instructions: N/A Last Dose: Time last taken:  Aspirin Instructions:N/A Last Dose: Time last taken:  Activity level: Unable to go up a flight of stairs without shortness of breath sometimes.    Anesthesia review: Hx: HTN,DIA,OSA(Hypoglossal stimulator),Palpitations.  Patient denies shortness of breath, fever, cough and chest pain at PAT appointment   Patient verbalized understanding of instructions that were reviewed over the telephone.

## 2024-11-06 NOTE — Progress Notes (Signed)
 Pt. Refused blood products.

## 2024-11-07 ENCOUNTER — Other Ambulatory Visit: Payer: Self-pay | Admitting: Physician Assistant

## 2024-11-07 DIAGNOSIS — Z01818 Encounter for other preprocedural examination: Secondary | ICD-10-CM

## 2024-11-08 ENCOUNTER — Ambulatory Visit (HOSPITAL_BASED_OUTPATIENT_CLINIC_OR_DEPARTMENT_OTHER): Admitting: Pulmonary Disease

## 2024-11-08 ENCOUNTER — Encounter (HOSPITAL_BASED_OUTPATIENT_CLINIC_OR_DEPARTMENT_OTHER): Payer: Self-pay | Admitting: Pulmonary Disease

## 2024-11-08 VITALS — BP 129/80 | HR 72 | Ht 67.0 in | Wt 215.0 lb

## 2024-11-08 DIAGNOSIS — Z9682 Presence of neurostimulator: Secondary | ICD-10-CM | POA: Diagnosis not present

## 2024-11-08 DIAGNOSIS — G4733 Obstructive sleep apnea (adult) (pediatric): Secondary | ICD-10-CM | POA: Diagnosis not present

## 2024-11-08 NOTE — Patient Instructions (Signed)
" °  VISIT SUMMARY: During your follow-up visit, we discussed your progress with the hyperglycemic simulator implant and your upcoming knee surgery. You reported initial confusion with the device, but you now understand how to operate it and are feeling better. We also reviewed your obstructive sleep apnea management with the hypoglossal nerve stimulator implantation.  YOUR PLAN: -OBSTRUCTIVE SLEEP APNEA: Obstructive sleep apnea is a condition where your breathing stops and starts during sleep due to blocked upper airways. You are managing this condition with a hypoglossal nerve stimulator implant, which helps keep your airway open. You are currently using level two settings and will increase to level three next Thursday. Ensure you bring the device to your knee surgery for verification. If you experience any issues with the device, contact [facility]. We will have a follow-up appointment in four weeks to review the device report and consider a sleep study.  INSTRUCTIONS: Increase the hypoglossal nerve stimulator device to level three next Thursday. Bring the device to your knee surgery for verification. Contact [facility] if any issues arise with the device. Follow-up appointment in four weeks to review the device report and consider a sleep study.    Contains text generated by [employer].   "

## 2024-11-08 NOTE — Progress Notes (Signed)
 "  Subjective:    Patient ID: Erica [name], female    DOB: 1956/03/11, 69 y.o.   MRN: [id number]   69 yo never smoker for follow-up of OSA  PMH -osteoarthritis, diabetes, panic attacks turbinate reduction surgery by Dr. Carlie 02/2022   OSA diagnosed in 2011 failed CPAP &  oral appliance  Underwent inspire implantation 07/11/24 08/23/24 activation : Sensation 0.3 V , functional level 0.5 V lower limit, upper limit 1.5 V Start delay 40 minutes, pause time 15 minutes, duration 7 hours     Discussed the use of AI scribe software for clinical note transcription with the patient, who gave verbal consent to proceed.  History of Present Illness Erica [name] is a 69 year old female who presents for a follow-up visit after the activation of her hyperglycemic simulator implant.  She reports prior confusion using the hyperglycemic simulator remote in December and January. She is currently using level two of the device and plans to increase to level three next week.  She has knee surgery planned next week to revise worn knee replacement hardware, with associated knee swelling.  She goes to bed around midnight. The device is programmed to shut off after seven hours. She sometimes wakes at night feeling thirsty, which she attributes to diabetes.     Significant tests/ events reviewed   05/2022 HST >> severe OSA, AHI 34/h, low saturation of 80%.   05/2019 CPAP titration >> 13 cm 11/2009  mild , AHI of 10/hr and desat as low as 83% - wt 240 lbs - BMI 39  PSG 09/2013 showed mild OSA- AHI 13/h    HST 01/2016 20/hr.     HST 03/2017 >> (with oral appliance ) >> mild OSa persists   CT head showed meningioma & has cluster headaches  Review of Systems  neg for any significant sore throat, dysphagia, itching, sneezing, nasal congestion or excess/ purulent secretions, fever, chills, sweats, unintended wt loss, pleuritic or exertional cp, hempoptysis, orthopnea pnd or change in chronic  leg swelling. Also denies presyncope, palpitations, heartburn, abdominal pain, nausea, vomiting, diarrhea or change in bowel or urinary habits, dysuria,hematuria, rash, arthralgias, visual complaints, headache, numbness weakness or ataxia.      Objective:   Physical Exam  Gen. Pleasant, obese, in no distress ENT - no lesions, no post nasal drip Neck: No JVD, no thyromegaly, no carotid bruits Lungs: no use of accessory muscles, no dullness to percussion, decreased without rales or rhonchi  Cardiovascular: Rhythm regular, heart sounds  normal, no murmurs or gallops, no peripheral edema Musculoskeletal: No deformities, no cyanosis or clubbing , no tremors  Tongue stimulation/protrusion checked with programmer at multiple levels DL reviewed      Assessment & Plan:   Assessment and Plan Assessment & Plan Obstructive sleep apnea status post hypoglossal nerve stimulator implantation Obstructive sleep apnea managed with hypoglossal nerve stimulator implantation. She is on level two of the device settings. Initial confusion with device operation has been addressed, and she now understands the process of turning the device on and off. No current issues with the device, and she reports feeling much better. The device is set to turn off automatically after seven hours. She is scheduled for knee surgery next Friday, and the device will not interfere with the surgery. - Continue current device settings 0.6V  and increase to level three next Thursday. - Ensure device is brought to knee surgery for verification. - Contact [facility] if any issues arise with  the device. - Scheduled follow-up appointment in four weeks to review device report and consider sleep study -cleared for TKR with due risk & precautions for OSA, minimise pain meds post op.     "

## 2024-11-11 ENCOUNTER — Encounter (HOSPITAL_COMMUNITY): Payer: Self-pay

## 2024-11-11 NOTE — Progress Notes (Signed)
 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 Lavona was on 03/08/2024. She has known palpitations, suspected to be related to PVCs and PACs, but have not been able to capture on long term monitoring studies. He asked her to reduce atenolol  to 25 mg BID and added Cardizem  30 mg Q PM. Mild plaque and 07/2023 carotid US , no further imaging felt indicated. She has known LE with normal BNP in 2024. Normal LV systolic function with some diastolic function by echo in 2018. She denied any new SOB. No additional testing recommended at last visit with six month follow-up planned. He noted plans for upcoming hypoglossal nerve stimulator implant.   Seen by Dr. Jude with Pulmonology on 11/08/24 to help patient regarding confusion on using inspire device. Per Dr. Jude: She is scheduled for knee surgery next Friday, and the device will not interfere with the surgery. - Continue current device settings 0.6V  and increase to level three next Thursday. - Ensure device is brought to knee surgery for verification. - Contact [facility] if any issues arise with the device. - Scheduled follow-up appointment in four weeks to review device report and consider sleep study -cleared for TKR with due risk & precautions for OSA, minimise pain meds post op.   VS:  Ht 5' 7 (1.702 m)   Wt 97.5 kg   BMI 33.67 kg/m   PROVIDERS: Geofm Glade PARAS, MD   LABS: {CHL AN LABS REVIEWED:112001::Labs reviewed: Acceptable for surgery.} (all labs ordered are listed, but only abnormal results are displayed)  Labs Reviewed - No data to display   EKG: 03/08/2024: Normal sinus rhythm Poor anterior R wave progression When compared with ECG of 14-Sep-2020 14:21, No significant change was found Confirmed by Lavona Agent ([id number]) on 03/08/2024 10:58:12 AM     CV: US  Carotid 07/12/2023: Summary:  - Right Carotid: The extracranial vessels were near-normal with only minimal wall thickening or plaque. History of FMD.  - Left Carotid: The extracranial vessels were near-normal with only minimal  wall thickening or plaque. History of FMD.  - Vertebrals:  Bilateral vertebral arteries demonstrate antegrade flow.  - Subclavians: Normal flow hemodynamics were seen in bilateral subclavian arteries.    Long term monitor 02/02/2023: Normal sinus rhythm No sustained arrhythmias Rare atrial and ventricular ectopy.    Echo 10/21/2016: Study Conclusions  - Left ventricle: The cavity size was normal. Wall thickness was    normal. Systolic function was normal. The estimated ejection    fraction was in the range of 55% to 60%. Wall motion was normal;    there were no regional wall motion abnormalities. Features are    consistent with a pseudonormal left ventricular filling pattern,    with concomitant abnormal relaxation and increased filling    pressure (grade 2 diastolic dysfunction).    Nuclear stress test  09/23/2016: Nuclear stress EF: 53%. There was no ST segment deviation noted during stress. Defect 1: There is a small defect of mild severity present in the basal inferoseptal and mid inferoseptal location. consistent with ischemia Findings consistent with ischemia. This is a low to intermediate risk study. - She was subsequently referred to cardiologist Lavona Agent, MD. Per 10/05/2016 Progress Note, This was mildly abnormal results similar to the previous. She's not having any chest pain. She doesn't have significant risk factors. At this point I don't think further cardiovascular testing is suggested.  Past Medical History:  Diagnosis Date   Abnormal stress test    a. 09/2016: NST showed a small defect of mild severity present in the basal inferoseptal and mid inferoseptal location, consistent with ischemia. --> medically managed   ALLERGIC RHINITIS    Anxiety    Bursitis    DDD (degenerative disc disease), lumbar    ESI with Ramos (spring 2016)   Depression    Diabetes mellitus without complication (HCC)    Type II   Dyslipidemia    External hemorrhoids    GERD (gastroesophageal reflux disease)    Hypertension    Obesity    Osteoarthritis    Palpitations    a. prior event monitor showing sinus tachycardia, no PAF.    Panic attacks    Hx of depression   Sleep apnea    No Cpap    Past Surgical History:  Procedure Laterality Date   ABDOMINAL HYSTERECTOMY     CATARACT EXTRACTION Left    DILATION AND CURETTAGE OF UTERUS     DRUG INDUCED ENDOSCOPY N/A 11/29/2023   Procedure: DRUG INDUCED ENDOSCOPY;  Surgeon: Jude Harden GAILS, MD;  Location: [facility] ENDOSCOPY;  Service: Pulmonary;  Laterality: N/A;   IMPLANTATION OF HYPOGLOSSAL NERVE STIMULATOR N/A 07/11/2024   Procedure: INSERTION, HYPOGLOSSAL NERVE STIMULATOR;  Surgeon: Okey Burns, MD;  Location: [facility] OR;  Service: ENT;  Laterality: N/A;   MASS EXCISION Left 12/30/2015   Procedure: EXCISION LEFT LEG MASS;  Surgeon: Donnice Lima, MD;  Location: [facility];  Service: General;  Laterality: Left;   NASAL TURBINATE REDUCTION Bilateral 03/04/2022   Procedure: TURBINATE REDUCTION;  Surgeon: Carlie Clark, MD;  Location: [facility] OR;  Service: ENT;  Laterality: Bilateral;   REPLACEMENT TOTAL KNEE Right    RIGHT OOPHORECTOMY     No cancer    MEDICATIONS:  Accu-Chek Softclix  Lancets lancets   acetaminophen  (TYLENOL ) 500 MG tablet   atenolol  (TENORMIN ) 25 MG tablet   Blood Glucose Monitoring Suppl (ACCU-CHEK GUIDE ME) w/Device KIT   clonazePAM  (KLONOPIN ) 0.5 MG tablet   CRESTOR  20 MG tablet   cyclobenzaprine  (FLEXERIL ) 5 MG tablet   diltiazem  (CARDIZEM ) 30 MG tablet   fluconazole  (DIFLUCAN ) 200 MG tablet   fluticasone  (FLONASE ) 50 MCG/ACT nasal spray   furosemide  (LASIX ) 40 MG tablet   gabapentin  (NEURONTIN ) 100 MG capsule   glucose blood (ACCU-CHEK GUIDE TEST) test strip   hydrOXYzine  (ATARAX ) 25 MG tablet   ibuprofen  (ADVIL ) 600 MG tablet   levocetirizine (XYZAL ) 5 MG tablet   linaclotide  (LINZESS ) 145 MCG CAPS capsule   LINZESS  290 MCG CAPS capsule   mupirocin  ointment (BACTROBAN ) 2 %   omeprazole  (PRILOSEC) 20 MG capsule   ondansetron  (ZOFRAN ) 4 MG tablet   oxyCODONE  (ROXICODONE ) 5 MG immediate release tablet   potassium chloride  SA (KLOR-CON  M) 20 MEQ tablet   SUMAtriptan -naproxen  (TREXIMET ) 85-500 MG tablet   tirzepatide  (MOUNJARO ) 2.5 MG/0.5ML Pen   TURMERIC  PO   valACYclovir  (VALTREX ) 1000 MG tablet   venlafaxine  XR (EFFEXOR -XR) 150 MG 24 hr capsule   Vitamin D , Ergocalciferol , (DRISDOL ) 1.25 MG (50000 UNIT) CAPS capsule   No current facility-administered medications for this encounter.          "

## 2024-11-12 ENCOUNTER — Encounter (HOSPITAL_COMMUNITY)

## 2024-11-13 ENCOUNTER — Encounter (HOSPITAL_COMMUNITY)

## 2024-11-14 ENCOUNTER — Encounter (HOSPITAL_COMMUNITY): Admission: RE | Admit: 2024-11-14 | Discharge: 2024-11-14 | Attending: Orthopaedic Surgery

## 2024-11-14 DIAGNOSIS — Z6837 Body mass index (BMI) 37.0-37.9, adult: Secondary | ICD-10-CM | POA: Insufficient documentation

## 2024-11-14 DIAGNOSIS — E66812 Obesity, class 2: Secondary | ICD-10-CM | POA: Insufficient documentation

## 2024-11-14 DIAGNOSIS — D649 Anemia, unspecified: Secondary | ICD-10-CM | POA: Insufficient documentation

## 2024-11-14 DIAGNOSIS — Z01812 Encounter for preprocedural laboratory examination: Secondary | ICD-10-CM | POA: Insufficient documentation

## 2024-11-14 DIAGNOSIS — Z01818 Encounter for other preprocedural examination: Secondary | ICD-10-CM

## 2024-11-14 LAB — CBC
HCT: 38.8 % (ref 36.0–46.0)
Hemoglobin: 11.9 g/dL — ABNORMAL LOW (ref 12.0–15.0)
MCH: 27.6 pg (ref 26.0–34.0)
MCHC: 30.7 g/dL (ref 30.0–36.0)
MCV: 90 fL (ref 80.0–100.0)
Platelets: 250 10*3/uL (ref 150–400)
RBC: 4.31 MIL/uL (ref 3.87–5.11)
RDW: 14.2 % (ref 11.5–15.5)
WBC: 6.6 10*3/uL (ref 4.0–10.5)
nRBC: 0 % (ref 0.0–0.2)

## 2024-11-14 LAB — BASIC METABOLIC PANEL WITH GFR
Anion gap: 8 (ref 5–15)
BUN: 16 mg/dL (ref 8–23)
CO2: 27 mmol/L (ref 22–32)
Calcium: 9.4 mg/dL (ref 8.9–10.3)
Chloride: 104 mmol/L (ref 98–111)
Creatinine, Ser: 0.73 mg/dL (ref 0.44–1.00)
GFR, Estimated: >60 mL/min
Glucose, Bld: 85 mg/dL (ref 70–99)
Potassium: 4 mmol/L (ref 3.5–5.1)
Sodium: 139 mmol/L (ref 135–145)

## 2024-11-14 LAB — HEMOGLOBIN A1C
Hgb A1c MFr Bld: 5.9 % — ABNORMAL HIGH (ref 4.8–5.6)
Mean Plasma Glucose: 122.63 mg/dL

## 2024-11-14 LAB — GLUCOSE, CAPILLARY: Glucose-Capillary: 86 mg/dL (ref 70–99)

## 2024-11-14 NOTE — Anesthesia Preprocedure Evaluation (Signed)
"                                    Anesthesia Evaluation    Airway        Dental   Pulmonary           Cardiovascular hypertension,      Neuro/Psych    GI/Hepatic   Endo/Other  diabetes    Renal/GU      Musculoskeletal   Abdominal   Peds  Hematology   Anesthesia Other Findings   Reproductive/Obstetrics                              Anesthesia Physical Anesthesia Plan  ASA:   Anesthesia Plan:    Post-op Pain Management:    Induction:   PONV Risk Score and Plan:   Airway Management Planned:   Additional Equipment:   Intra-op Plan:   Post-operative Plan:   Informed Consent:   Plan Discussed with:   Anesthesia Plan Comments: (See PAT note from 1/27)         Anesthesia Quick Evaluation  "

## 2024-11-15 LAB — SURGICAL PCR SCREEN
MRSA, PCR: NEGATIVE
Staphylococcus aureus: NEGATIVE

## 2024-11-15 LAB — NO BLOOD PRODUCTS

## 2024-11-15 NOTE — H&P (Signed)
 Erica [name] is an 69 y.o. female.   Chief Complaint: Right knee pain and swelling HPI: The patient is a 69 year old female who has a history of a right total knee arthroplasty done around 20 years ago.  The knee had done well until over the last year when she developed some swelling and pain with the knee.  On exam there is some laxity on ligamentous exam and a three-phase bone scan only showed synovitis of the knee.  The plain film showed no evidence of loosening of the components.  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  At this point we are recommending a poly liner exchange with upsizing of the poly liner as well as synovectomy given her symptoms.  Past Medical History:  Diagnosis Date   Abnormal stress test    a. 09/2016: NST showed a small defect of mild severity present in the basal inferoseptal and mid inferoseptal location, consistent with ischemia. --> medically managed   ALLERGIC RHINITIS    Anxiety    Bursitis    DDD (degenerative disc disease), lumbar    ESI with Ramos (spring 2016)   Depression    Diabetes mellitus without complication (HCC)    Type II   Dyslipidemia    External hemorrhoids    GERD (gastroesophageal reflux disease)    Hypertension    Obesity    Osteoarthritis    Palpitations    a. prior event monitor showing sinus tachycardia, no PAF.    Panic attacks    Hx of depression   Sleep apnea    No Cpap    Past Surgical History:  Procedure Laterality Date   ABDOMINAL HYSTERECTOMY     CATARACT EXTRACTION Left    DILATION AND CURETTAGE OF UTERUS     DRUG INDUCED ENDOSCOPY N/A 11/29/2023   Procedure: DRUG INDUCED ENDOSCOPY;  Surgeon: Jude Harden GAILS, MD;  Location: [facility] ENDOSCOPY;  Service: Pulmonary;  Laterality: N/A;   IMPLANTATION OF HYPOGLOSSAL NERVE STIMULATOR N/A 07/11/2024   Procedure: INSERTION, HYPOGLOSSAL NERVE STIMULATOR;  Surgeon: Okey Burns, MD;  Location: [facility] OR;  Service: ENT;  Laterality: N/A;   MASS  EXCISION Left 12/30/2015   Procedure: EXCISION LEFT LEG MASS;  Surgeon: Donnice Lima, MD;  Location: [facility];  Service: General;  Laterality: Left;   NASAL TURBINATE REDUCTION Bilateral 03/04/2022   Procedure: TURBINATE REDUCTION;  Surgeon: Carlie Clark, MD;  Location: [facility] OR;  Service: ENT;  Laterality: Bilateral;   REPLACEMENT TOTAL KNEE Right    RIGHT OOPHORECTOMY     No cancer    Family History  Problem Relation Age of Onset   Heart disease Mother        Enlarged Heart, Pacemaker   Diabetes Mother    Thyroid  disease Mother    Hyperlipidemia Mother    Hypertension Mother    Sleep apnea Mother    Dementia Father    Kidney failure Father    Prostate cancer Father    Alcoholism Father    Allergies Sister    Breast cancer Maternal Aunt        cousin   Prostate cancer Maternal Uncle    Asthma Son    Heart disease Son        CHF, morbid obesity   Diabetes Son    Colon cancer Cousin    Asthma Other    Rectal cancer Neg Hx    Stomach cancer Neg Hx    Esophageal cancer Neg Hx  Social History:  reports that she has never smoked. She has never used smokeless tobacco. She reports current alcohol use of about 1.0 standard drink of alcohol per week. She reports that she does not use drugs.  Allergies: Allergies[1]  No medications prior to admission.    Results for orders placed or performed during the hospital encounter of 11/14/24 (from the past 48 hours)  Glucose, capillary     Status: None   Collection Time: 11/14/24 11:22 AM  Result Value Ref Range   Glucose-Capillary 86 70 - 99 mg/dL    Comment: Glucose reference range applies only to samples taken after fasting for at least 8 hours.  No blood products     Status: None   Collection Time: 11/14/24 11:28 AM  Result Value Ref Range   Transfuse no blood products      TRANSFUSE NO BLOOD PRODUCTS, VERIFIED BY RN PAMILA HESTER Performed at [facility], [address] [place],  [place] [address]   CBC     Status: Abnormal   Collection Time: 11/14/24 11:49 AM  Result Value Ref Range   WBC 6.6 4.0 - 10.5 K/uL   RBC 4.31 3.87 - 5.11 MIL/uL   Hemoglobin 11.9 (L) 12.0 - 15.0 g/dL   HCT 61.1 63.9 - 53.9 %   MCV 90.0 80.0 - 100.0 fL   MCH 27.6 26.0 - 34.0 pg   MCHC 30.7 30.0 - 36.0 g/dL   RDW 85.7 88.4 - 84.4 %   Platelets 250 150 - 400 K/uL   nRBC 0.0 0.0 - 0.2 %    Comment: Performed at [facility], [address], [place], [place] [address]  Basic metabolic panel     Status: None   Collection Time: 11/14/24 11:49 AM  Result Value Ref Range   Sodium 139 135 - 145 mmol/L   Potassium 4.0 3.5 - 5.1 mmol/L   Chloride 104 98 - 111 mmol/L   CO2 27 22 - 32 mmol/L   Glucose, Bld 85 70 - 99 mg/dL    Comment: Glucose reference range applies only to samples taken after fasting for at least 8 hours.   BUN 16 8 - 23 mg/dL   Creatinine, Ser 9.26 0.44 - 1.00 mg/dL   Calcium  9.4 8.9 - 10.3 mg/dL   GFR, Estimated >39 >39 mL/min    Comment: (NOTE) Calculated using the CKD-EPI Creatinine Equation (2021)    Anion gap 8 5 - 15    Comment: Performed at [facility], [address] [place], [place] [address]  Hemoglobin A1c per protocol     Status: Abnormal   Collection Time: 11/14/24 11:49 AM  Result Value Ref Range   Hgb A1c MFr Bld 5.9 (H) 4.8 - 5.6 %    Comment: (NOTE) Diagnosis of Diabetes The following HbA1c ranges recommended by the American Diabetes Association (ADA) may be used as an aid in the diagnosis of diabetes mellitus.  Hemoglobin             Suggested A1C NGSP%              Diagnosis  <5.7                   Non Diabetic  5.7-6.4                Pre-Diabetic  >6.4                   Diabetic  <  7.0                   Glycemic control for                       adults with diabetes.     Mean Plasma Glucose 122.63 mg/dL    Comment: Performed at [facility], [address], [place], [place] [address]  Surgical pcr  screen     Status: None   Collection Time: 11/14/24 12:02 PM   Specimen: Nasal Mucosa; Nasal Swab  Result Value Ref Range   MRSA, PCR NEGATIVE NEGATIVE   Staphylococcus aureus NEGATIVE NEGATIVE    Comment: (NOTE) The Xpert SA Assay (FDA approved for NASAL specimens in patients 7 years of age and older), is one component of a comprehensive surveillance program. It is not intended to diagnose infection nor to guide or monitor treatment. Performed at [facility], [address], [place], [place] [address]    No results found.  Review of Systems  There were no vitals taken for this visit. Physical Exam Vitals reviewed.  Constitutional:      Appearance: Normal appearance. She is obese.  HENT:     Head: Normocephalic and atraumatic.  Eyes:     Extraocular Movements: Extraocular movements intact.     Pupils: Pupils are equal, round, and reactive to light.  Cardiovascular:     Rate and Rhythm: Normal rate and regular rhythm.  Pulmonary:     Effort: Pulmonary effort is normal.     Breath sounds: Normal breath sounds.  Abdominal:     Palpations: Abdomen is soft.  Musculoskeletal:     Cervical back: Normal range of motion and neck supple.     Right knee: Effusion present. Tenderness present over the medial joint line and lateral joint line.  Neurological:     Mental Status: She is alert and oriented to person, place, and time.  Psychiatric:        Behavior: Behavior normal.      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  The risks and benefits of the surgery have been discussed in detail.  Given the patient's findings of persistent pain, laxity and swelling, the surgery is warranted at this standpoint given the failure of  conservative treatment.  [name], MD 11/15/2024, 6:51 PM       [1]  Allergies Allergen Reactions   Seroquel [Quetiapine Fumarate] Other (See Comments)    Hallucinations, palpitations   Codeine Nausea And Vomiting   Guaifenesin -Codeine Other (See Comments)     feels spacey   Wellbutrin [Bupropion] Palpitations    hallucinations

## 2024-11-16 ENCOUNTER — Other Ambulatory Visit: Payer: Self-pay

## 2024-11-16 ENCOUNTER — Observation Stay (HOSPITAL_COMMUNITY): Admission: RE | Admit: 2024-11-16 | Source: Ambulatory Visit | Admitting: Orthopaedic Surgery

## 2024-11-16 ENCOUNTER — Encounter (HOSPITAL_COMMUNITY): Payer: Self-pay | Admitting: Orthopaedic Surgery

## 2024-11-16 ENCOUNTER — Observation Stay (HOSPITAL_COMMUNITY)

## 2024-11-16 ENCOUNTER — Encounter (HOSPITAL_COMMUNITY): Payer: Self-pay | Admitting: Certified Registered"

## 2024-11-16 ENCOUNTER — Ambulatory Visit (HOSPITAL_COMMUNITY): Payer: Self-pay | Admitting: Medical

## 2024-11-16 ENCOUNTER — Encounter (HOSPITAL_COMMUNITY): Admission: RE | Payer: Self-pay | Source: Ambulatory Visit

## 2024-11-16 DIAGNOSIS — E1159 Type 2 diabetes mellitus with other circulatory complications: Secondary | ICD-10-CM

## 2024-11-16 DIAGNOSIS — Z6837 Body mass index (BMI) 37.0-37.9, adult: Secondary | ICD-10-CM

## 2024-11-16 DIAGNOSIS — Z01818 Encounter for other preprocedural examination: Secondary | ICD-10-CM

## 2024-11-16 DIAGNOSIS — Z96651 Presence of right artificial knee joint: Secondary | ICD-10-CM

## 2024-11-16 DIAGNOSIS — T84068A Wear of articular bearing surface of other internal prosthetic joint, initial encounter: Secondary | ICD-10-CM

## 2024-11-16 DIAGNOSIS — Z96659 Presence of unspecified artificial knee joint: Principal | ICD-10-CM

## 2024-11-16 LAB — BASIC METABOLIC PANEL WITH GFR
Anion gap: 8 (ref 5–15)
BUN: 14 mg/dL (ref 8–23)
CO2: 24 mmol/L (ref 22–32)
Calcium: 9.4 mg/dL (ref 8.9–10.3)
Chloride: 107 mmol/L (ref 98–111)
Creatinine, Ser: 0.78 mg/dL (ref 0.44–1.00)
GFR, Estimated: >60 mL/min
Glucose, Bld: 114 mg/dL — ABNORMAL HIGH (ref 70–99)
Potassium: 4.2 mmol/L (ref 3.5–5.1)
Sodium: 140 mmol/L (ref 135–145)

## 2024-11-16 LAB — CBC
HCT: 38.2 % (ref 36.0–46.0)
Hemoglobin: 11.7 g/dL — ABNORMAL LOW (ref 12.0–15.0)
MCH: 27.6 pg (ref 26.0–34.0)
MCHC: 30.6 g/dL (ref 30.0–36.0)
MCV: 90.1 fL (ref 80.0–100.0)
Platelets: 236 10*3/uL (ref 150–400)
RBC: 4.24 MIL/uL (ref 3.87–5.11)
RDW: 14.2 % (ref 11.5–15.5)
WBC: 7 10*3/uL (ref 4.0–10.5)
nRBC: 0 % (ref 0.0–0.2)

## 2024-11-16 LAB — GLUCOSE, CAPILLARY
Glucose-Capillary: 63 mg/dL — ABNORMAL LOW (ref 70–99)
Glucose-Capillary: 80 mg/dL (ref 70–99)
Glucose-Capillary: 86 mg/dL (ref 70–99)
Glucose-Capillary: 100 mg/dL — ABNORMAL HIGH (ref 70–99)
Glucose-Capillary: 104 mg/dL — ABNORMAL HIGH (ref 70–99)

## 2024-11-16 LAB — TYPE AND SCREEN
ABO/RH(D): O POS
Antibody Screen: NEGATIVE

## 2024-11-16 MED ORDER — PROPOFOL 1000 MG/100ML IV EMUL
INTRAVENOUS | Status: AC
  Filled 2024-11-16: qty 100

## 2024-11-16 MED ORDER — ASPIRIN 81 MG PO CHEW
81.0000 mg | CHEWABLE_TABLET | Freq: Two times a day (BID) | ORAL | Status: AC
  Administered 2024-11-16: 81 mg via ORAL
  Filled 2024-11-16: qty 1

## 2024-11-16 MED ORDER — HYDROMORPHONE HCL 1 MG/ML IJ SOLN
0.2500 mg | INTRAMUSCULAR | Status: DC | PRN
  Administered 2024-11-16 (×3): 0.5 mg via INTRAVENOUS

## 2024-11-16 MED ORDER — HYDROMORPHONE HCL 1 MG/ML IJ SOLN
INTRAMUSCULAR | Status: AC
  Filled 2024-11-16: qty 2

## 2024-11-16 MED ORDER — DEXAMETHASONE SOD PHOSPHATE PF 10 MG/ML IJ SOLN
INTRAMUSCULAR | Status: AC
  Filled 2024-11-16: qty 1

## 2024-11-16 MED ORDER — POTASSIUM CHLORIDE CRYS ER 20 MEQ PO TBCR: 20.0000 meq | EXTENDED_RELEASE_TABLET | Freq: Every day | ORAL | Status: AC

## 2024-11-16 MED ORDER — METOCLOPRAMIDE HCL 5 MG PO TABS: 5.0000 mg | ORAL_TABLET | Freq: Three times a day (TID) | ORAL | Status: AC | PRN

## 2024-11-16 MED ORDER — AMISULPRIDE (ANTIEMETIC) 5 MG/2ML IV SOLN: 10.0000 mg | Freq: Once | INTRAVENOUS | Status: DC | PRN

## 2024-11-16 MED ORDER — FENTANYL CITRATE (PF) 50 MCG/ML IJ SOSY
50.0000 ug | PREFILLED_SYRINGE | INTRAMUSCULAR | Status: DC
  Administered 2024-11-16: 50 ug via INTRAVENOUS
  Filled 2024-11-16: qty 1

## 2024-11-16 MED ORDER — PANTOPRAZOLE SODIUM 40 MG PO TBEC
40.0000 mg | DELAYED_RELEASE_TABLET | Freq: Every day | ORAL | Status: AC
  Administered 2024-11-16: 40 mg via ORAL
  Filled 2024-11-16: qty 1

## 2024-11-16 MED ORDER — ORAL CARE MOUTH RINSE: 15.0000 mL | Freq: Once | OROMUCOSAL | Status: AC

## 2024-11-16 MED ORDER — ROSUVASTATIN CALCIUM 20 MG PO TABS
20.0000 mg | ORAL_TABLET | Freq: Every day | ORAL | Status: AC
  Administered 2024-11-16: 20 mg via ORAL
  Filled 2024-11-16: qty 1

## 2024-11-16 MED ORDER — ONDANSETRON HCL 4 MG/2ML IJ SOLN
INTRAMUSCULAR | Status: AC
  Filled 2024-11-16: qty 2

## 2024-11-16 MED ORDER — PROPOFOL 10 MG/ML IV BOLUS
INTRAVENOUS | Status: DC | PRN
  Administered 2024-11-16: 20 mg via INTRAVENOUS

## 2024-11-16 MED ORDER — VENLAFAXINE HCL ER 150 MG PO CP24: 150.0000 mg | ORAL_CAPSULE | Freq: Every day | ORAL | Status: AC

## 2024-11-16 MED ORDER — 0.9 % SODIUM CHLORIDE (POUR BTL) OPTIME
TOPICAL | Status: DC | PRN
  Administered 2024-11-16: 1000 mL

## 2024-11-16 MED ORDER — FUROSEMIDE 40 MG PO TABS: 40.0000 mg | ORAL_TABLET | Freq: Every day | ORAL | Status: AC

## 2024-11-16 MED ORDER — STERILE WATER FOR IRRIGATION IR SOLN
Status: DC | PRN
  Administered 2024-11-16: 1000 mL

## 2024-11-16 MED ORDER — ATENOLOL 50 MG PO TABS
25.0000 mg | ORAL_TABLET | Freq: Two times a day (BID) | ORAL | Status: AC
  Administered 2024-11-16: 25 mg via ORAL
  Filled 2024-11-16: qty 1

## 2024-11-16 MED ORDER — PHENYLEPHRINE HCL-NACL 20-0.9 MG/250ML-% IV SOLN
INTRAVENOUS | Status: DC | PRN
  Administered 2024-11-16: 20 ug/min via INTRAVENOUS

## 2024-11-16 MED ORDER — HYDROMORPHONE HCL 1 MG/ML IJ SOLN: 0.5000 mg | INTRAMUSCULAR | Status: AC | PRN

## 2024-11-16 MED ORDER — LEVOCETIRIZINE DIHYDROCHLORIDE 5 MG PO TABS: 5.0000 mg | ORAL_TABLET | Freq: Every evening | ORAL | Status: DC

## 2024-11-16 MED ORDER — LINACLOTIDE 145 MCG PO CAPS: 145.0000 ug | ORAL_CAPSULE | Freq: Every day | ORAL | Status: DC

## 2024-11-16 MED ORDER — POVIDONE-IODINE 10 % EX SWAB: 2.0000 | Freq: Once | CUTANEOUS | Status: DC

## 2024-11-16 MED ORDER — OXYCODONE HCL 5 MG PO TABS
10.0000 mg | ORAL_TABLET | ORAL | Status: AC | PRN
  Administered 2024-11-16: 10 mg via ORAL
  Filled 2024-11-16: qty 2

## 2024-11-16 MED ORDER — LACTATED RINGERS IV SOLN: INTRAVENOUS | Status: DC

## 2024-11-16 MED ORDER — ONDANSETRON HCL 4 MG/2ML IJ SOLN: 4.0000 mg | Freq: Four times a day (QID) | INTRAMUSCULAR | Status: AC | PRN

## 2024-11-16 MED ORDER — ONDANSETRON HCL 4 MG/2ML IJ SOLN
INTRAMUSCULAR | Status: DC | PRN
  Administered 2024-11-16: 4 mg via INTRAVENOUS

## 2024-11-16 MED ORDER — HYDROXYZINE HCL 25 MG PO TABS: 25.0000 mg | ORAL_TABLET | Freq: Three times a day (TID) | ORAL | Status: AC | PRN

## 2024-11-16 MED ORDER — INSULIN ASPART 100 UNIT/ML IJ SOLN: 0.0000 [IU] | INTRAMUSCULAR | Status: DC | PRN

## 2024-11-16 MED ORDER — ACETAMINOPHEN 325 MG PO TABS: 325.0000 mg | ORAL_TABLET | Freq: Four times a day (QID) | ORAL | Status: AC | PRN

## 2024-11-16 MED ORDER — LORATADINE 10 MG PO TABS
10.0000 mg | ORAL_TABLET | Freq: Every evening | ORAL | Status: AC
  Administered 2024-11-16: 10 mg via ORAL
  Filled 2024-11-16: qty 1

## 2024-11-16 MED ORDER — BUPIVACAINE-EPINEPHRINE (PF) 0.25% -1:200000 IJ SOLN
INTRAMUSCULAR | Status: AC
  Filled 2024-11-16: qty 30

## 2024-11-16 MED ORDER — ROPIVACAINE HCL 5 MG/ML IJ SOLN
INTRAMUSCULAR | Status: DC | PRN
  Administered 2024-11-16: 20 mL via PERINEURAL

## 2024-11-16 MED ORDER — DEXTROSE 50 % IV SOLN
12.5000 g | INTRAVENOUS | Status: AC
  Administered 2024-11-16: 12.5 g via INTRAVENOUS
  Filled 2024-11-16: qty 50

## 2024-11-16 MED ORDER — ONDANSETRON HCL 4 MG PO TABS: 4.0000 mg | ORAL_TABLET | Freq: Four times a day (QID) | ORAL | Status: AC | PRN

## 2024-11-16 MED ORDER — LIDOCAINE HCL (CARDIAC) PF 100 MG/5ML IV SOSY
PREFILLED_SYRINGE | INTRAVENOUS | Status: DC | PRN
  Administered 2024-11-16: 40 mg via INTRAVENOUS

## 2024-11-16 MED ORDER — TRANEXAMIC ACID-NACL 1000-0.7 MG/100ML-% IV SOLN
1000.0000 mg | INTRAVENOUS | Status: AC
  Administered 2024-11-16: 1000 mg via INTRAVENOUS
  Filled 2024-11-16: qty 100

## 2024-11-16 MED ORDER — LINACLOTIDE 145 MCG PO CAPS
145.0000 ug | ORAL_CAPSULE | Freq: Every day | ORAL | Status: AC
  Filled 2024-11-16: qty 1

## 2024-11-16 MED ORDER — PROPOFOL 10 MG/ML IV BOLUS
INTRAVENOUS | Status: AC
  Filled 2024-11-16: qty 20

## 2024-11-16 MED ORDER — TIZANIDINE HCL 4 MG PO TABS
4.0000 mg | ORAL_TABLET | Freq: Four times a day (QID) | ORAL | Status: AC | PRN
  Administered 2024-11-16: 4 mg via ORAL
  Filled 2024-11-16: qty 1

## 2024-11-16 MED ORDER — BUPIVACAINE IN DEXTROSE 0.75-8.25 % IT SOLN
INTRATHECAL | Status: DC | PRN
  Administered 2024-11-16: 1.8 mL via INTRATHECAL

## 2024-11-16 MED ORDER — ALUM & MAG HYDROXIDE-SIMETH 200-200-20 MG/5ML PO SUSP: 30.0000 mL | ORAL | Status: AC | PRN

## 2024-11-16 MED ORDER — CLONAZEPAM 0.5 MG PO TABS
0.5000 mg | ORAL_TABLET | Freq: Two times a day (BID) | ORAL | Status: AC
  Administered 2024-11-16: 0.5 mg via ORAL
  Filled 2024-11-16: qty 1

## 2024-11-16 MED ORDER — SODIUM CHLORIDE 0.9 % IV SOLN: INTRAVENOUS | Status: AC

## 2024-11-16 MED ORDER — PHENOL 1.4 % MT LIQD: 1.0000 | OROMUCOSAL | Status: AC | PRN

## 2024-11-16 MED ORDER — MIDAZOLAM HCL (PF) 2 MG/2ML IJ SOLN
1.0000 mg | INTRAMUSCULAR | Status: DC
  Administered 2024-11-16: 2 mg via INTRAVENOUS
  Filled 2024-11-16: qty 2

## 2024-11-16 MED ORDER — PROPOFOL 500 MG/50ML IV EMUL
INTRAVENOUS | Status: DC | PRN
  Administered 2024-11-16: 40 ug/kg/min via INTRAVENOUS

## 2024-11-16 MED ORDER — MENTHOL 3 MG MT LOZG: 1.0000 | LOZENGE | OROMUCOSAL | Status: AC | PRN

## 2024-11-16 MED ORDER — BUPIVACAINE-EPINEPHRINE (PF) 0.25% -1:200000 IJ SOLN
INTRAMUSCULAR | Status: DC | PRN
  Administered 2024-11-16: 30 mL

## 2024-11-16 MED ORDER — SENNA 8.6 MG PO TABS: 1.0000 | ORAL_TABLET | Freq: Two times a day (BID) | ORAL | Status: AC

## 2024-11-16 MED ORDER — CHLORHEXIDINE GLUCONATE 0.12 % MT SOLN
15.0000 mL | Freq: Once | OROMUCOSAL | Status: AC
  Administered 2024-11-16: 15 mL via OROMUCOSAL

## 2024-11-16 MED ORDER — CEFAZOLIN SODIUM-DEXTROSE 2-4 GM/100ML-% IV SOLN
2.0000 g | INTRAVENOUS | Status: AC
  Administered 2024-11-16: 2 g via INTRAVENOUS
  Filled 2024-11-16: qty 100

## 2024-11-16 MED ORDER — OXYCODONE HCL 5 MG PO TABS: 5.0000 mg | ORAL_TABLET | ORAL | Status: AC | PRN

## 2024-11-16 MED ORDER — CEFAZOLIN SODIUM-DEXTROSE 2-4 GM/100ML-% IV SOLN
2.0000 g | Freq: Four times a day (QID) | INTRAVENOUS | Status: AC
  Administered 2024-11-16: 2 g via INTRAVENOUS
  Filled 2024-11-16: qty 100

## 2024-11-16 MED ORDER — DEXAMETHASONE SOD PHOSPHATE PF 10 MG/ML IJ SOLN
INTRAMUSCULAR | Status: DC | PRN
  Administered 2024-11-16: 8 mg via INTRAVENOUS

## 2024-11-16 MED ORDER — LIDOCAINE HCL (PF) 2 % IJ SOLN
INTRAMUSCULAR | Status: AC
  Filled 2024-11-16: qty 5

## 2024-11-16 MED ORDER — METOCLOPRAMIDE HCL 5 MG/ML IJ SOLN: 5.0000 mg | Freq: Three times a day (TID) | INTRAMUSCULAR | Status: AC | PRN

## 2024-11-16 NOTE — Interval H&P Note (Signed)
 History and Physical Interval Note: The patient understands that she is here today for surgery on her right knee.  She has developed synovitis due to polyethylene liner wear from a total knee arthroplasty it was placed about 20 years ago.  There has been no acute or interval change in her medical status.  She understands that our goals are to proceed with surgery for an open synovectomy and polyliner exchange for her right knee.  The risks and benefits of surgery have been discussed in detail and informed consent has been obtained.  The right operative knee has been marked.  11/16/2024 12:42 PM  Erica [name]  has presented today for surgery, with the diagnosis of poly-liner wear right knee.  The various methods of treatment have been discussed with the patient and family. After consideration of risks, benefits and other options for treatment, the patient has consented to  Procedures with comments: IRRIGATION AND DEBRIDEMENT KNEE WITH POLY EXCHANGE (Right) - RIGHT KNEE POLY-LINER EXCHANGE as a surgical intervention.  The patient's history has been reviewed, patient examined, no change in status, stable for surgery.  I have reviewed the patient's chart and labs.  Questions were answered to the patient's satisfaction.     [name]

## 2024-11-16 NOTE — Anesthesia Procedure Notes (Signed)
 Procedure Name: MAC Date/Time: 11/16/2024 1:58 PM  Performed by: Metta Andrea NOVAK, CRNAPre-anesthesia Checklist: Patient identified, Emergency Drugs available, Suction available, Patient being monitored and Timeout performed Oxygen Delivery Method: Simple face mask Placement Confirmation: positive ETCO2

## 2024-11-16 NOTE — Anesthesia Procedure Notes (Signed)
 Anesthesia Regional Block: Adductor canal block   Pre-Anesthetic Checklist: , timeout performed,  Correct Patient, Correct Site, Correct Laterality,  Correct Procedure, Correct Position, site marked,  Risks and benefits discussed,  Surgical consent,  Pre-op evaluation,  At surgeon's request and post-op pain management  Laterality: Right  Prep: chloraprep       Needles:  Injection technique: Single-shot  Needle Type: Stimiplex     Needle Length: 9cm  Needle Gauge: 21     Additional Needles:   Procedures:,,,, ultrasound used (permanent image in chart),,    Narrative:  Start time: 11/16/2024 1:20 PM End time: 11/16/2024 1:25 PM Injection made incrementally with aspirations every 5 mL.  Performed by: Personally  Anesthesiologist: Cleotilde Butler Dade, MD

## 2024-11-16 NOTE — Progress Notes (Signed)
 Hypoglycemic Event  CBG: 63   Treatment: D50 25 mL (12.5 gm)  Symptoms: None  Follow-up CBG: Time:1310 CBG Result:80   Possible Reasons for Event: NPO for procedure  Comments/MD notified:Dr. Cleotilde  Patient came in with CBG of 63, aymptomatic. IV access established to admin D50 as patient is NPO for procedure.  Once IV access established d50 given. Patient w/o s/s or c/o hypoglycemia. Requested to use restroom, pre op finished. CBG rechecked with Dr. Cleotilde in room prior to block - result = 80. Patient continued to deny or display hypoglycemia.

## 2024-11-16 NOTE — Transfer of Care (Signed)
 Immediate Anesthesia Transfer of Care Note  Patient: Erica [name]  Procedure(s) Performed: IRRIGATION AND DEBRIDEMENT KNEE WITH POLY EXCHANGE (Right: Knee)  Patient Location: PACU  Anesthesia Type:General  Level of Consciousness: awake and alert   Airway & Oxygen Therapy: Patient Spontanous Breathing and Patient connected to face mask oxygen  Post-op Assessment: Report given to RN and Post -op Vital signs reviewed and stable  Post vital signs: Reviewed and stable  Last Vitals:  Vitals Value Taken Time  BP 126/84 11/16/24 16:12  Temp    Pulse 77 11/16/24 16:13  Resp 12 11/16/24 16:13  SpO2 95 % 11/16/24 16:13  Vitals shown include unfiled device data.  Last Pain:  Vitals:   11/16/24 1237  TempSrc:   PainSc: 0-No pain         Complications: No notable events documented.

## 2024-11-16 NOTE — Anesthesia Procedure Notes (Signed)
 Spinal  Patient location during procedure: OR Start time: 11/16/2024 2:06 PM Reason for block: surgical anesthesia  Staffing Performed: resident/CRNA  Authorized by: Cleotilde Butler Dade, MD   Performed by: Metta Andrea NOVAK, CRNA  Preanesthetic Checklist Completed: patient identified, IV checked, site marked, risks and benefits discussed, surgical consent, monitors and equipment checked, pre-op evaluation and timeout performed Spinal Block Patient position: sitting Prep: DuraPrep and site prepped and draped Patient monitoring: heart rate, cardiac monitor, continuous pulse ox and blood pressure Approach: midline Location: L3-4 Injection technique: single-shot Needle Needle type: Pencan  Needle gauge: 24 G Needle length: 10 cm Assessment Events: CSF return  Additional Notes Pt placed in sitting position, spinal kit expiration date checked and verified, timeout performed, + CSF, - heme, pt tolerated well. Dr Cleotilde present and supervising throughout SAB placement.

## 2024-11-16 NOTE — Op Note (Signed)
 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 Item Serial No. Manufacturer Lot No. LRB No. Used Action  CEMENT HV SMART SET - [id number] Cement CEMENT HV SMART SET  [employer] [id number] Right 1 Implanted  INSERT PFC SIG STB SZ3 15. - [id number] Knees INSERT PFC SIG STB SZ3 15.0MM  [employer] [id number] Right 1 Implanted  ATTUNE MED DOME PAT 38 KNEE - [id number] Knees ATTUNE MED DOME PAT 38 KNEE  [employer] [id number] Right 1 Implanted   Surgeon: Lonni GRADE. Vernetta, MD Assistant: Tory Gaskins, PA-C  Anesthesia: #1 right lower extremity adductor canal block,#2 spinal, #3 local Antibiotics: IV Ancef  Tourniquet time: Less than 1 hour EBL: Less than 100 cc Complications: None  Indications: The patient is a 69 year old active female who just over 20 years ago had a right total knee arthroplasty performed by one of my colleagues in town secondary to significant osteoarthritis of the knee.  She eventually just follow-up with us  in the last few months and was noted to have right knee pain with swelling and effusion.  On exam she also has some instability of the knee with laxity.  Her plain films showed no evidence of loosening of the femoral or tibial components and there is no obvious osteolysis.  It did look like there was polyliner wear.  A three-phase bone scan did not show evidence of prosthetic loosening.  At this point we have  recommended upsizing the poly liner given the laxity of her knee and likely polyethylene liner wear.  She does understand there is risks of acute blood loss anemia, nerve and vessel injury, fracture, infection, DVT, implant failure and wound healing issues.  She understands our goals are hopefully decreased pain, improved mobility and improve quality of life.  Procedure description: After informed consent was obtained and the proper right knee was marked, a right lower extremity adductor canal block was performed in the holding room.  The patient was then brought to the operative room and set up on the operative table where spinal anesthesia was obtained.  She was then laid in supine position on the operating table and a Foley catheter is placed.  A nonsterile tractors placed around her upper right thigh and her right thigh, knee, leg and ankle were prepped and draped with DuraPrep and sterile drapes.  Timeout is called and she is about the correct patient correct right knee.  An Esmarch was used to wrap out the leg and the tourniquet was plated 300 mm of pressure.  We then went directly through her previous incision that was longitudinal incision over the knee and carried this proximally distally.  We dissected the knee joint through significant adipose tissue and performed a medial parapatellar arthrotomy.  We found a very large joint effusion but no evidence of infection.  We found significant synovitis and we had to perform a synovectomy in all 3 compartments.  We found significant wear and polyethylene liner debris in the knee.  There was significant wear of the polyethylene  patella button.  We then were able to remove the previous polyethylene insert between the femur and the tibia and found significant wear of that poly.  We did not find any evidence of loosening of the metal components of the knee with no loosening of the femur or the tibia components.  We then removed the patella component as well.  Next  we irrigated the knee with 2 L normal saline solution using pulsatile lavage.  We then trialed up to a 15 mm thickness polythene liner.  The previous liner that was removed was a 10 mm thickness liner.  We were able to place the thickness  15 mm RP polythene insert for a size 3 femur without difficulty.  Next we sized for a size 3 attune patella dome and drilled 3 holes for the patella dome.  We mixed cement and then cemented our new size 38 patella button.  Once this was done we let the tourniquet down and hemostasis was obtained with electrocautery.  The arthrotomy was closed with a combination of interrupted #1 Ethibond suture proximally as well as #2 FiberWire suture distally due to some of the patella tendon pulling off the bone.  This was not enough to place any type of anchor.  Once the arthrotomy was closed we closed the deep tissue with #1 Vicryl followed by 0 Vicryl, and then 2-0 Vicryl in the subcutaneous tissue.  The skin was closed with staples.  Well-padded sterile dressing was applied.  The patient was taken to the PACU in stable condition.  Tory Gaskins, PA-C did assist during the entire case and beginning to end and his assistance was crucial and medically necessary for soft tissue management and retraction, helping guide implant placement and a layered closure of the wound.  "

## 2024-11-20 ENCOUNTER — Encounter: Admitting: Internal Medicine

## 2024-11-20 ENCOUNTER — Ambulatory Visit: Admitting: Family Medicine

## 2024-11-29 ENCOUNTER — Encounter: Admitting: Orthopaedic Surgery

## 2024-12-05 ENCOUNTER — Ambulatory Visit: Admitting: Physician Assistant

## 2024-12-10 ENCOUNTER — Ambulatory Visit (HOSPITAL_BASED_OUTPATIENT_CLINIC_OR_DEPARTMENT_OTHER): Admitting: Pulmonary Disease

## 2024-12-11 ENCOUNTER — Ambulatory Visit: Admitting: Dermatology
